# Patient Record
Sex: Female | Born: 1965 | Race: Black or African American | Hispanic: No | Marital: Single | Smoking: Never smoker
Health system: Southern US, Community
[De-identification: ages and names within clinical notes are randomized; demographics above are authoritative.]

## PROBLEM LIST (undated history)

## (undated) DIAGNOSIS — R0602 Shortness of breath: Secondary | ICD-10-CM

## (undated) DIAGNOSIS — I5032 Chronic diastolic (congestive) heart failure: Secondary | ICD-10-CM

## (undated) DIAGNOSIS — E669 Obesity, unspecified: Secondary | ICD-10-CM

## (undated) DIAGNOSIS — J45901 Unspecified asthma with (acute) exacerbation: Secondary | ICD-10-CM

## (undated) DIAGNOSIS — Z01818 Encounter for other preprocedural examination: Secondary | ICD-10-CM

## (undated) DIAGNOSIS — E1169 Type 2 diabetes mellitus with other specified complication: Secondary | ICD-10-CM

## (undated) DIAGNOSIS — E785 Hyperlipidemia, unspecified: Secondary | ICD-10-CM

## (undated) DIAGNOSIS — N179 Acute kidney failure, unspecified: Secondary | ICD-10-CM

## (undated) DIAGNOSIS — E871 Hypo-osmolality and hyponatremia: Secondary | ICD-10-CM

## (undated) DIAGNOSIS — J4550 Severe persistent asthma, uncomplicated: Secondary | ICD-10-CM

## (undated) DIAGNOSIS — Z9289 Personal history of other medical treatment: Secondary | ICD-10-CM

## (undated) DIAGNOSIS — I509 Heart failure, unspecified: Secondary | ICD-10-CM

## (undated) DIAGNOSIS — I10 Essential (primary) hypertension: Secondary | ICD-10-CM

## (undated) HISTORY — PX: TRACHEOSTOMY: SUR1362

## (undated) HISTORY — DX: Shortness of breath: R06.02

## (undated) HISTORY — DX: Unspecified asthma with (acute) exacerbation: J45.901

## (undated) HISTORY — DX: Acute kidney failure, unspecified: N17.9

## (undated) HISTORY — DX: Type 2 diabetes mellitus with other specified complication: E11.69

## (undated) HISTORY — DX: Obesity, unspecified: E66.9

## (undated) HISTORY — DX: Encounter for other preprocedural examination: Z01.818

## (undated) HISTORY — DX: Severe persistent asthma, uncomplicated: J45.50

## (undated) HISTORY — PX: OTHER SURGICAL HISTORY: SHX169

## (undated) HISTORY — PX: EYE SURGERY: SHX253

## (undated) HISTORY — DX: Hypo-osmolality and hyponatremia: E87.1

## (undated) HISTORY — DX: Chronic diastolic (congestive) heart failure: I50.32

---

## 2000-09-01 ENCOUNTER — Other Ambulatory Visit: Admission: RE | Admit: 2000-09-01 | Discharge: 2000-09-01 | Payer: Self-pay | Admitting: Family Medicine

## 2000-10-08 ENCOUNTER — Inpatient Hospital Stay (HOSPITAL_COMMUNITY): Admission: EM | Admit: 2000-10-08 | Discharge: 2000-10-13 | Payer: Self-pay | Admitting: Emergency Medicine

## 2000-10-08 ENCOUNTER — Encounter: Payer: Self-pay | Admitting: Emergency Medicine

## 2000-10-10 ENCOUNTER — Encounter: Payer: Self-pay | Admitting: General Surgery

## 2000-11-10 ENCOUNTER — Ambulatory Visit (HOSPITAL_COMMUNITY): Admission: RE | Admit: 2000-11-10 | Discharge: 2000-11-10 | Payer: Self-pay | Admitting: Family Medicine

## 2001-12-05 ENCOUNTER — Emergency Department (HOSPITAL_COMMUNITY): Admission: EM | Admit: 2001-12-05 | Discharge: 2001-12-05 | Payer: Self-pay | Admitting: Emergency Medicine

## 2001-12-05 ENCOUNTER — Encounter: Payer: Self-pay | Admitting: *Deleted

## 2002-10-14 ENCOUNTER — Inpatient Hospital Stay (HOSPITAL_COMMUNITY): Admission: EM | Admit: 2002-10-14 | Discharge: 2002-10-21 | Payer: Self-pay | Admitting: Emergency Medicine

## 2003-06-10 ENCOUNTER — Inpatient Hospital Stay (HOSPITAL_COMMUNITY): Admission: EM | Admit: 2003-06-10 | Discharge: 2003-06-14 | Payer: Self-pay | Admitting: *Deleted

## 2003-10-17 ENCOUNTER — Other Ambulatory Visit: Admission: RE | Admit: 2003-10-17 | Discharge: 2003-10-17 | Payer: Self-pay | Admitting: Obstetrics & Gynecology

## 2004-05-07 ENCOUNTER — Emergency Department (HOSPITAL_COMMUNITY): Admission: EM | Admit: 2004-05-07 | Discharge: 2004-05-07 | Payer: Self-pay | Admitting: Emergency Medicine

## 2004-05-10 ENCOUNTER — Ambulatory Visit: Payer: Self-pay | Admitting: *Deleted

## 2004-05-10 ENCOUNTER — Ambulatory Visit (HOSPITAL_COMMUNITY): Admission: RE | Admit: 2004-05-10 | Discharge: 2004-05-10 | Payer: Self-pay | Admitting: Family Medicine

## 2006-06-07 ENCOUNTER — Emergency Department (HOSPITAL_COMMUNITY): Admission: EM | Admit: 2006-06-07 | Discharge: 2006-06-07 | Payer: Self-pay | Admitting: Emergency Medicine

## 2006-07-26 ENCOUNTER — Ambulatory Visit (HOSPITAL_COMMUNITY): Admission: RE | Admit: 2006-07-26 | Discharge: 2006-07-26 | Payer: Self-pay | Admitting: Family Medicine

## 2007-07-27 ENCOUNTER — Ambulatory Visit (HOSPITAL_COMMUNITY): Admission: RE | Admit: 2007-07-27 | Discharge: 2007-07-27 | Payer: Self-pay | Admitting: Family Medicine

## 2008-01-18 ENCOUNTER — Emergency Department (HOSPITAL_COMMUNITY): Admission: EM | Admit: 2008-01-18 | Discharge: 2008-01-19 | Payer: Self-pay | Admitting: Emergency Medicine

## 2008-05-13 ENCOUNTER — Emergency Department (HOSPITAL_COMMUNITY): Admission: EM | Admit: 2008-05-13 | Discharge: 2008-05-13 | Payer: Self-pay | Admitting: Emergency Medicine

## 2008-07-29 ENCOUNTER — Ambulatory Visit (HOSPITAL_COMMUNITY): Admission: RE | Admit: 2008-07-29 | Discharge: 2008-07-29 | Payer: Self-pay | Admitting: Family Medicine

## 2009-08-17 ENCOUNTER — Ambulatory Visit (HOSPITAL_COMMUNITY): Admission: RE | Admit: 2009-08-17 | Discharge: 2009-08-17 | Payer: Self-pay | Admitting: Family Medicine

## 2010-03-22 ENCOUNTER — Encounter: Payer: Self-pay | Admitting: Family Medicine

## 2010-06-10 LAB — GLUCOSE, CAPILLARY
Glucose-Capillary: 45 mg/dL — ABNORMAL LOW (ref 70–99)
Glucose-Capillary: 62 mg/dL — ABNORMAL LOW (ref 70–99)

## 2010-07-16 NOTE — Discharge Summary (Signed)
NAMESONDI, GRIBBON                      ACCOUNT NO.:  [id number]   MEDICAL RECORD NO.:  [id number]                   PATIENT TYPE:  INP   LOCATION:  A307                                 FACILITY:  [facility]   PHYSICIAN:  Barrie Folk. Berdine Addison, M.D.                DATE OF BIRTH:  Aug 08, 1965   DATE OF ADMISSION:  10/12/2002  DATE OF DISCHARGE:  10/21/2002                                 DISCHARGE SUMMARY   The patient is a 45 year old, single, gravida 0, disabled black female  from  [place], [place].   CHIEF COMPLAINT:  Presentation of boil x3 days involving the right upper  thigh.  The boil ruptured on October 12, 2002.  The drainage was described as  malodorous, pustular, and bloody in nature.  History was also positive for  fever at home.  She denied chills or trauma to right thigh.   MEDICAL HISTORY:  Positive for insulin-dependent diabetes mellitus, morbid  obesity, hypertension, hyperlipidemia, and asthma.   Past medical  history positive for hospitalization for respiratory therapy  in 1986 treated at [facility] by tracheostomy and hospitalization  x2 for bronchitis.   Habits were negative for tobacco and street drug use.  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   Last menstrual period was August 14, 2002.   FAMILY HISTORY:  1. Revealed mother living, age 74, with history of hypertension and     diabetes.  2. Father deceased, age 25, cause unknown.  3. One sister living, age 74, with a history of anemia.   HOSPITAL COURSE BY PROBLEM:  Problem 1: ACUTE RIGHT UPPER THIGH ABSCESS.  Vitals on admission were as follows: Temperature 101.7, pulse 115,  respirations 20, blood pressure 175/95.  General appearance revealed an  overweight, slightly short, alert, black female in no apparent respiratory  distress.  Right thigh positive for slight medial area of induration, now  erythematous. The middle of induration had a drainage stoma.   Drainage from  the stoma was cloudy and malodorous.  Affected area by the proximal part of  the thigh.  Left extremity was benign.   Significant labs on admission were as follows:  White count 16.7, hemoglobin  11.4, hematocrit 36.0, platelets 281,000.  Sodium 136, potassium 3.3,  chloride 102, CO2 30, glucose 162, BUN 8, creatinine 0.8.   The patient was treated with IV fluid, blood culture x2, initially IV  Rocephin x1 then Unasyn 3 gm IV initially, Unasyn 1.5 gm IV q.6h., Levaquin  500 mg p.o. daily, gram stain and culture of draining right thigh area,  Tylenol for temperature and other supportive measures.  The patient's  hospital course was uneventful. Repeat white count on October 13, 2002 was  12,400.  Repeat white count on October 16, 2002 was 9800.  Blood culture was  negative for growth after 5 days.  Culture of right thigh wound demonstrated  multiple organisms present.  No prominent, or predominate organisms.  Moreover, pathology indicated no Staphylococcus aureus isolated, and no  group A Streptococcus was isolated.  The patient was afebrile at the time of  discharge.  Induration has significantly decreased.  Drainage was minimal.  The patient had no complaints of fever, chills or general malaise.   Problem 2: HYPOKALEMIA.  Serum potassium on admission was 3.3.  The patient  was treated with p.o. potassium.  Repeat serum potassium on October 20, 2002  was 4.0.   Problem 3: TYPE 1 DIABETES MELLITUS.  Serum glucose on admission was 162.  Hemoglobin A1c was 13.1.  The patient was treated with carbohydrate,  modified, 1600 calorie, 4-gram sodium, low cholesterol, Humulin N, 40 units  subcu every morning at breakfast.  Humulin R 15 units subcu every morning at  breakfast.  Humulin N 15 units subcu every day at supper.  Humulin R 10  units subcu every day at supper.  Accu-Check frequently.  The patient's  blood sugar was controlled during this hospitalization.  The dietician did   see the patient during this hospitalization.  The importance of adherence to  diet was emphasized to the patient.  The patient is capable of understanding  the recommendations by the dietician.   Problem 4: MORBID OBESITY.  The importance of weight loss was discussed with  the patient.  The significance of obesity pertaining to diabetes and  hypertension were also emphasized to the patient.  The patient was  encouraged to walk daily and stay on a diet.   Problem 5: HYPERTENSION.  Blood pressure on admission was 175/95.  Lungs  were clear.  Heart revealed audible S1, S2, without murmurs, rubs, or  gallops.  Rate was 96. The patient was treated with Lasix 40 mg p.o. daily,  Vasotec 5 mg p.o. b.i.d. and low sodium diet. Blood pressure on the morning  of discharge was 134/81.  Her heart exam was within normal limits.  The  patient had no complaints of chest pain, shortness of breath or palpitations  at the time of discharge.   Problem 6: HYPERLIPIDEMIA.  Fasting lipid profile on October 13, 2002  revealed the following:  Total cholesterol 187, triglycerides of 58, HDL  cholesterol 56, LDL cholesterol 119.  The patient was continued on low  cholesterol diet and Lipitor 10 mg p.o. at bedtime. She was not complaining  of any muscle aches or rash.  The patient was discharged home on October 21, 2002.  She was discharged to her home where she lives with her mother.   DISCHARGE INSTRUCTIONS:  1. Diet:  Carbohydrate modified 1600 calorie, low salt, low cholesterol.  2. Activity:  As tolerated.  3. Medications:     a. Lipitor 10 mg 1 tablet p.o. at bedtime.     b. Actos 45 mg 1 tablet p.o. every day with breakfast.     c. Lortab 5 mg 1-2 tablets p.o. q.4-6h. as needed for pain.     d. Glucovance 2.5 mg 1 tablet p.o. every day with supper.     e. Lasix 40 mg 1 tablet p.o. every day.     f. Vasotec 5 mg 1 tablet twice a day.    g. Augmentin XR 1000 mg every day with food.     h. Humulin N 40  units subcu every morning with breakfast.        a. Humulin R 15 units subcu every morning with breakfast.  i. Humulin N 15 units subcu every day with supper.     j. Humulin R 10 units subcu every day with supper.  4. Accu-Check 7 a.m., 6 p.m. and 10 p.m. every day.  5. Follow up with Dr. Berdine Addison October 28, 2002.   FINAL PRIMARY DIAGNOSES:  1. Acute right thigh abscess.  2. Hypokalemia.   SECONDARY DIAGNOSES:  1. Insulin-dependent diabetes mellitus.  2. Hypertension.  3. Hyperlipidemia.  4. Morbid obesity.                                               Barrie Folk. Berdine Addison, M.D.    Armanda Heritage  D:  10/21/2002  T:  10/22/2002  Job:  [id number]

## 2010-07-16 NOTE — Procedures (Signed)
[name], [name]          ACCOUNT NO.:  [id number]   MEDICAL RECORD NO.:  [id number]          PATIENT TYPE:  OUT   LOCATION:  RAD                           FACILITY:  [facility]   PHYSICIAN:  Scarlett Presto, M.D.   DATE OF BIRTH:  Oct 11, 1965   DATE OF PROCEDURE:  DATE OF DISCHARGE:                                  ECHOCARDIOGRAM   TAPE NUMBER:  [id number].   TAPE NUMBER:  [id number].   This is a morbidly obese woman with hypertension and a question of redux.   The M mode tracings are not accurate due to the patient's body habitus.  The  overall quality of the study is extremely difficult secondary to poor  acoustic windows from the patient's body habitus.   ASSESSMENT:  1.  LV systolic function appears to be preserved.  2.  There is normal right ventricular size.  3.  There is no obvious valvular heart disease.  4.  There is no pericardial effusion.  5.  Beyond these elements, there is very little clinically relevant from      this study.      JH/MEDQ  D:  05/10/2004  T:  05/10/2004  Job:  [id number]   cc:   Barrie Folk. Berdine Addison, MD  [address]  [place]  [place] [address]  Fax: [phone]

## 2010-07-16 NOTE — Discharge Summary (Signed)
[name], [name]                      ACCOUNT NO.:  [id number]   MEDICAL RECORD NO.:  [id number]                   PATIENT TYPE:  INP   LOCATION:  A325                                 FACILITY:  [facility]   PHYSICIAN:  Leane Para C. Tamala Julian, M.D.                DATE OF BIRTH:  [birth year]   DATE OF ADMISSION:  06/09/2004  DATE OF DISCHARGE:  06/14/2003                                 DISCHARGE SUMMARY   DISCHARGE DIAGNOSES:  1. Acute bronchitis with hypoxemia.  2. Insulin-dependent diabetes mellitus.  3. Hypertension.  4. Hyperlipidemia.  5. Morbid obesity.   DISPOSITION:  The patient is discharged home in stable improved condition.   DISCHARGE MEDICATIONS:  1. DuoNebs 3 mL 4 times daily.  2. Glucovance 2.5/500 mg b.i.d.  3. Lasix 20 mg daily.  4. K-Dur 10 mEq daily.  5. Lipitor 40 mg nightly.  6. Altace 5 mg daily.  7. Humulin 70/30 -- 40 units q.a.m. and 30 units q.p.m.  8. Hemocyte 1 daily.  9. Histussin HC 2 teaspoons q.4 h. p.r.n.   FOLLOWUP:  The patient is scheduled to see Dr. Barrie Folk. Hill in the office  1 week post discharge.   SUMMARY:  [age]-year-old female with history of chest pain, shortness  of breath, productive cough of green-brown sputum.  She had worsening  exertional dyspnea.  She was seen in the emergency room with hypoxemia,  fever, leukocytosis.  Clinical exam was compatible with acute bronchitis and  she was admitted.  The patient was started on treatment with nebulizers, IV  Levaquin, p.o. Zithromax and continued on her baseline medications.  She was  given antitussive medications.  She improved.  She had initial white count  of 17,000, which gradually improved.  O2 saturation improved and her cough  improved.  By 14 April, her white count was 13,000.  Her cough was  significantly less.  O2 saturation was 93% to 94%.  By the 16th of April,  she was doing well; she had no cough, fever or chills; she had no dyspnea at  rest; O2 saturation was 94%  on room air; she was afebrile.  Her lungs were  clear; she has no wheezes, rhonchi or rales.  She was felt to be stable  enough for discharge and she was discharged home in stable, satisfactory  condition.    ___________________________________________                                         Vernon Prey Tamala Julian, M.D.   LCS/MEDQ  D:  07/20/2003  T:  07/21/2003  Job:  [id number]

## 2010-07-16 NOTE — H&P (Signed)
NAMEBREONDA, [name]                      ACCOUNT NO.:  [id number]   MEDICAL RECORD NO.:  [id number]                   PATIENT TYPE:  OBV   LOCATION:  A307                                 FACILITY:  [facility]   PHYSICIAN:  Barrie Folk. Berdine Addison, M.D.                DATE OF BIRTH:  03-31-1965   DATE OF ADMISSION:  10/12/2002  DATE OF DISCHARGE:                                HISTORY & PHYSICAL   CHIEF COMPLAINT:  Painful draining boil of right upper thigh.   HISTORY OF PRESENT ILLNESS:  The patient is a 45 year old single, gravida 0,  disabled black female from [place], [place].  The boil has been  present x3 days.  The boil ruptured on October 12, 2002.  Drainage from the  boil is described as malodorous and drainage is pustular-bloody.  History is  positive for fever at home.  She denies chills, or trauma to right thigh.   PAST MEDICAL HISTORY:  1. Insulin-dependent diabetes mellitus.  2. Morbid obesity.  3. Hypertension.  4. Hyperlipidemia.  5. Asthma.  6. Negative for tuberculosis, cancer, sickle cell and seizure disorder.   Past medical history is positive for hospitalization for respiratory failure  in 1986, treated at [facility] and requiring tracheostomy and  hospitalization x2 for bronchitis.   SOCIAL HISTORY:  Habits are negative for tobacco and street drugs.  Habit is  positive for former use of ethanol, greater than ten years.  Last menstrual  period was on August 14, 2002 and duration was four weeks.  The patient is  being followed by Jonnie Kind, M.D., Gynecologist, pertaining to this  problem.   Sexually transmitted disease history is negative for gonorrhea, syphilis,  herpes and HIV infection.   PRESCRIBED MEDICATIONS:  1. Humulin N 65 units subcu every morning.  2. Humulin R 35 units subcu every morning.  3. Vasotec 5 mg two tablets p.o. every day.  4. Furosemide 40 mg p.o. daily.  5. Motrin 800 mg p.o. t.i.d.  6. Glucovance 2.5 mg p.o. every  morning.  7. Lipitor 10 mg p.o. at bedtime.  8. Actos 40 mg p.o. every day.   ALLERGIES:  Not allergic to any known medication.   FAMILY HISTORY:  Mother living, age 40 with history of hypertension and  diabetes; father deceased at age 73, cause unknown; one sister living, age  15 with history of anemia.   REVIEW OF SYSTEMS:  Negative for chronic headache, epistaxis, bleeding gums,  chronic cough, wheezing, hemoptysis, chest pain, shortness of breath,  syncope, dizziness, nausea, vomiting, diarrhea, constipation, vaginal  discharge, vaginal itching, weight loss, etc.   PHYSICAL EXAMINATION:  GENERAL:  General appearance revealed a middle aged,  overweight, slightly short, alert black female in no apparent respiratory  distress.  VITAL SIGNS:  Temperature 101.7, pulse 115, respirations 20, blood pressure  175/95.  SKIN:  Warm and dry.  HEENT:  Head-Positive thinning of hair.  Ears-Normal, external canals  patent, tympanic membranes, pearly gray.  Eyes-Lids negative ptosis, sclerae  white, pupils round, equal and reactive to light.  Extraocular movements  intact.  Nose-Negative for discharge.  Mouth-No oral lesions.  Posterior  pharynx positive for mildly enlarged tonsils without erythema, exudate.  Neck-Negative for lymphadenopathy or thyromegaly.  Supraclavicular space, no  palpable nodes.  LUNGS:  Clear.  BREASTS:  No skin changes, no nodules on palpation.  No irregular discharge.  HEART:  Audible S1, S2 without murmur.  Rate 96, regular rhythm.  ABDOMEN:  Obese, hypoactive bowel sounds, soft, nontender in all four  quadrants.  No palpable mass, no organomegaly.  PELVIC:  Deferred.  RECTAL:  Deferred.  EXTREMITIES:  Lower extremity-Right proximal thigh, slight medial aspect,  positive for tender area, nonerythematous, of induration with draining  stoma.  Drainage from stoma is cloudy and malodorous.  Left lower extremity,  benign.  [place]ia-Trace of edema bilaterally.  Palpable  dorsalis pedis  bilaterally.  NEUROLOGIC:  Alert and oriented to person, place and time.  Cranial nerves  II-XII appeared intact.   LABORATORY DATA:  White count 16.7, hemoglobin 11.4, hematocrit 36.0,  platelets 281,000.  Sodium 136, potassium 3.3, chloride 102, CO2 30, glucose  162, BUN 8, creatinine 0.8.   TREATMENT:  In the emergency room, the patient was treated with Rocephin 1  gram IV at 2245 on October 12, 2002, Unasyn 3 grams IV at 2345 on October 12, 2002 and Lortab 2 tablets p.o. at 0030 on October 13, 2002.   IMPRESSIONS:  1. Primary acute right proximal thigh abscess.  2. Mild hypokalemia.   SECONDARY DIAGNOSES:  1. Hypertension.  2. Insulin dependent diabetes mellitus.  3. Morbid obesity.  4. Hyperlipidemia.   PLAN:  Blood culture, Gram stain and culture of drainage from wound of right  thigh, IV antibiotics, Vasotec 5 mg p.o. b.i.d., Lasix 40 mg p.o. daily, IV  fluids, Motrin 800 mg p.o. t.i.d. with meals, Glucovance 2.5 mg p.o. every  morning with breakfast, Actos 45 mg p.o. every morning with breakfast,  Humulin N 40 units subcu every morning at breakfast, Humulin R 15 units  subcu every morning at breakfast, Humulin N 15 units subcu every day at  supper, Humulin R 10 units subcu every day at supper.  Laboratory-Hemoglobin  A1C, urinalysis.  Accu-Chek at 0700,  1600 and 2200 daily.  Diet-Carbohydrate modified, 1600 calorie, 4 gram  sodium, low cholesterol.  Fasting lipid profile.  Repeat CBC in 24 hours.  Lortab 5 mg 2 tablets p.o. q.4-6h. p.r.n. for pain, K-Dur 20 mEq p.o. now  and then b.i.d. x2 days, Lipitor 10 mg p.o. at bedtime.                                                 Barrie Folk. Berdine Addison, M.D.    Armanda Heritage  D:  10/13/2002  T:  10/13/2002  Job:  [id number]

## 2010-08-25 ENCOUNTER — Other Ambulatory Visit (HOSPITAL_COMMUNITY): Payer: Self-pay | Admitting: Family Medicine

## 2010-08-25 DIAGNOSIS — Z139 Encounter for screening, unspecified: Secondary | ICD-10-CM

## 2010-09-02 ENCOUNTER — Ambulatory Visit (HOSPITAL_COMMUNITY)
Admission: RE | Admit: 2010-09-02 | Discharge: 2010-09-02 | Disposition: A | Discharge status: Home / Self Care | Payer: Medicare Other | Source: Ambulatory Visit | Attending: Family Medicine | Admitting: Family Medicine

## 2010-09-02 DIAGNOSIS — Z1231 Encounter for screening mammogram for malignant neoplasm of breast: Secondary | ICD-10-CM | POA: Insufficient documentation

## 2010-09-02 DIAGNOSIS — Z139 Encounter for screening, unspecified: Secondary | ICD-10-CM

## 2010-09-08 ENCOUNTER — Other Ambulatory Visit (HOSPITAL_COMMUNITY): Payer: Self-pay | Admitting: Family Medicine

## 2010-09-08 DIAGNOSIS — IMO0002 Reserved for concepts with insufficient information to code with codable children: Secondary | ICD-10-CM

## 2010-09-08 DIAGNOSIS — Z09 Encounter for follow-up examination after completed treatment for conditions other than malignant neoplasm: Secondary | ICD-10-CM

## 2010-09-21 ENCOUNTER — Other Ambulatory Visit (HOSPITAL_COMMUNITY): Payer: Self-pay | Admitting: Family Medicine

## 2010-09-21 DIAGNOSIS — IMO0002 Reserved for concepts with insufficient information to code with codable children: Secondary | ICD-10-CM

## 2010-09-22 ENCOUNTER — Ambulatory Visit (HOSPITAL_COMMUNITY): Payer: Medicare Other

## 2010-10-06 ENCOUNTER — Ambulatory Visit (HOSPITAL_COMMUNITY)
Admission: RE | Admit: 2010-10-06 | Discharge: 2010-10-06 | Disposition: A | Discharge status: Home / Self Care | Payer: Medicare Other | Source: Ambulatory Visit | Attending: Family Medicine | Admitting: Family Medicine

## 2010-10-06 DIAGNOSIS — R928 Other abnormal and inconclusive findings on diagnostic imaging of breast: Secondary | ICD-10-CM | POA: Insufficient documentation

## 2010-10-06 DIAGNOSIS — IMO0002 Reserved for concepts with insufficient information to code with codable children: Secondary | ICD-10-CM

## 2010-10-13 ENCOUNTER — Inpatient Hospital Stay (HOSPITAL_COMMUNITY): Admission: RE | Admit: 2010-10-13 | Payer: Medicare Other | Source: Ambulatory Visit

## 2010-10-13 ENCOUNTER — Encounter (HOSPITAL_COMMUNITY): Payer: Medicare Other

## 2010-12-29 ENCOUNTER — Emergency Department (HOSPITAL_COMMUNITY)
Admission: EM | Admit: 2010-12-29 | Discharge: 2010-12-29 | Disposition: A | Discharge status: Home / Self Care | Payer: PRIVATE HEALTH INSURANCE | Attending: Emergency Medicine | Admitting: Emergency Medicine

## 2010-12-29 ENCOUNTER — Emergency Department (HOSPITAL_COMMUNITY): Payer: PRIVATE HEALTH INSURANCE

## 2010-12-29 ENCOUNTER — Other Ambulatory Visit: Payer: Self-pay

## 2010-12-29 DIAGNOSIS — M549 Dorsalgia, unspecified: Secondary | ICD-10-CM | POA: Insufficient documentation

## 2010-12-29 DIAGNOSIS — J45909 Unspecified asthma, uncomplicated: Secondary | ICD-10-CM | POA: Insufficient documentation

## 2010-12-29 DIAGNOSIS — R739 Hyperglycemia, unspecified: Secondary | ICD-10-CM

## 2010-12-29 DIAGNOSIS — I10 Essential (primary) hypertension: Secondary | ICD-10-CM | POA: Insufficient documentation

## 2010-12-29 DIAGNOSIS — R079 Chest pain, unspecified: Secondary | ICD-10-CM | POA: Insufficient documentation

## 2010-12-29 DIAGNOSIS — E119 Type 2 diabetes mellitus without complications: Secondary | ICD-10-CM | POA: Insufficient documentation

## 2010-12-29 HISTORY — DX: Essential (primary) hypertension: I10

## 2010-12-29 MED ORDER — OXYCODONE-ACETAMINOPHEN 5-325 MG PO TABS: 1.0000 | ORAL_TABLET | ORAL | Status: AC | PRN

## 2010-12-29 MED ORDER — OXYCODONE-ACETAMINOPHEN 5-325 MG PO TABS
1.0000 | ORAL_TABLET | Freq: Once | ORAL | Status: AC
  Administered 2010-12-29: 1 via ORAL
  Filled 2010-12-29: qty 1

## 2010-12-29 NOTE — ED Notes (Signed)
Complain of chest pain that started last night. Denies n/v/d or fever

## 2010-12-29 NOTE — ED Notes (Signed)
Pt states that she has a sharp pain in the right side of her chest since  2 am. Denies nausea, vomiting or shortness of breath. Pt alert and oriented x 3. Skin warm and dry. Color pink.

## 2010-12-29 NOTE — ED Provider Notes (Signed)
Scribed for Sharyon Cable, MD, the patient was seen in room APA15/APA15 . This chart was scribed by Glory Buff.   CSN: [id number] Arrival date & time: 12/29/2010  9:06 AM   First MD Initiated Contact with Patient 12/29/10 [phone]      Chief Complaint  Patient presents with  . Chest Pain     Patient is a 45 y.o. female presenting with chest pain. The history is provided by the patient. No language interpreter was used.  Chest Pain The pain is currently at 8/10. The quality of the pain is described as sharp. The pain does not radiate. Pertinent negatives for primary symptoms include no fever, no shortness of breath, no abdominal pain, no nausea and no vomiting.  Pertinent negatives for associated symptoms include no diaphoresis, no lower extremity edema, no near-syncope, no numbness and no weakness.    Pt seen at 9:30 [name] is a 45 y.o. female who presents to the Emergency Department complaining of chest pain. At 2 this morning Pt says she was bending over and folding clothes when she had sudden onset of right-sided chest pain. Pt originally thought the chest pain was heartburn and treated pain with baking soda. Pt says CP resolved but returned at 7am ~2.5 hour ago and has been constant since then. Pain does not radiate but Pt does reports some associated back pain but does not appear related to her CP. Pain is described as sharp and currently rated 8/10 in severity. Pain is not aggravated by deep breathing or exertion. Pt denies diaphoresis, SOB, weakness, numbness, n/v/d, fever, or dizziness. Pt reports some recent cough. Pt denies recent LOC, new swelling, recent travel or surgery. Pt denies history of blood clots.  Additionally Pt has h/o diabetes. Blood sugar is not always controlled and is usually ~200. This week around 120. Has not tested today.  No personal h/o CAD/PE.   PCP Dr. Berdine Addison. Has not seen PCP recently.  Past Medical History  Diagnosis Date  . Asthma   .  Hypertension   . Diabetes mellitus     History reviewed. No pertinent past surgical history.  History reviewed. No pertinent family history. Mother and uncle have heart problems, not at young age.   History  Substance Use Topics  . Smoking status: Not on file  . Smokeless tobacco: Not on file  . Alcohol Use: Yes  Non smoker. Does not use drugs.   Review of Systems  Constitutional: Negative for fever and diaphoresis.  Respiratory: Negative for shortness of breath.   Cardiovascular: Positive for chest pain. Negative for leg swelling (no new) and near-syncope.  Gastrointestinal: Negative for nausea, vomiting, abdominal pain and diarrhea.  Musculoskeletal: Positive for back pain.  Neurological: Negative for syncope, weakness and numbness.  All other systems reviewed and are negative.   10 Systems reviewed and are negative for acute change except as noted in the HPI.  Allergies  Review of patient's allergies indicates no known allergies.  Home Medications  No current outpatient prescriptions on file.   Ht 5\' 1"  (1.549 m)  Wt 400 lb (181.439 kg)  BMI 75.58 kg/m2  LMP 10/28/2010 BP 146/82  Pulse 69  Temp(Src) 98.6 F (37 C) (Oral)  Resp 18  Ht 5\' 1"  (1.549 m)  Wt 400 lb (181.439 kg)  BMI 75.58 kg/m2  SpO2 98%  LMP 10/28/2010  Physical Exam CONSTITUTIONAL: Well developed/well nourished HEAD AND FACE: Normocephalic/atraumatic EYES: EOMI/PERRL ENMT: Mucous membranes moist NECK: supple no meningeal signs SPINE:entire  spine nontender CV: S1/S2 noted, no murmurs/rubs/gallops noted. No redness or crepitance of chest. Intact distal pulses. LUNGS: Lungs are clear to auscultation bilaterally, no apparent distress ABDOMEN: soft, nontender, no rebound or guarding. Obese GU:no cva tenderness NEURO: Pt is awake/alert, moves all extremitiesx4, well appearing, smiling, no distress EXTREMITIES: pulses normal, full ROM. symmetric BLE chronic pitting edema with no redness or  tenderness.  SKIN: warm, color normal PSYCH: no abnormalities of mood noted  ED Course  Procedures (including critical care time)  OTHER DATA REVIEWED: Nursing notes, vital signs, and past medical records reviewed. xrays reviewed and considered All labs/vitals reviewed and considered  Dw/ dr hill, her PCP, will see in followup this Monday at 1045am since she missed recent appointment  DIAGNOSTIC STUDIES: Oxygen Saturation is 96% on room air, normal by my interpretation.    Labs Reviewed  GLUCOSE, CAPILLARY - Abnormal; Notable for the following:    Glucose-Capillary 286 (*)    All other components within normal limits  POCT CBG MONITORING   Dg Chest 2 View  12/29/2010  *RADIOLOGY REPORT*  Clinical Data: Chest pain, hypertension, asthma, diabetes  CHEST - 2 VIEW  Comparison: Chest x-ray of 05/13/2008  Findings: The lungs are clear.  Mediastinal contours appear stable. The heart is within normal limits in size.  No bony abnormality is seen.  IMPRESSION: No active lung disease.  Original Report Authenticated By: Joretta Bachelor, M.D.    9:33 AM EDP at PT bedside. Discussed with PT ECG looks normal. Plan to get chest x ray. Plan to discharge with follow up w/ Dr. Berdine Addison if chest xray is negative.     Pt improved, well appearing.  My suspicion for ACS/PE/Dissection is low.  She will f/u next week.  She admits to issues with glucose control recently.  No vomiting reported, she can f/u as outpatient for this  BP 146/82  Pulse 69  Temp(Src) 98.6 F (37 C) (Oral)  Resp 18  Ht 5\' 1"  (1.549 m)  Wt 400 lb (181.439 kg)  BMI 75.58 kg/m2  SpO2 98%  LMP 10/28/2010   MDM   Date: 12/29/2010  Rate: 85  Rhythm: normal sinus rhythm  QRS Axis: normal  Intervals: normal  ST/T Wave abnormalities: normal  Conduction Disutrbances:none  Narrative Interpretation:   Old EKG Reviewed: none available     I personally performed the services described in this documentation, which was scribed in  my presence. The recorded information has been reviewed and considered.         Sharyon Cable, MD 12/29/10 [phone]

## 2011-09-08 ENCOUNTER — Other Ambulatory Visit (HOSPITAL_COMMUNITY): Payer: Self-pay | Admitting: Family Medicine

## 2011-09-08 DIAGNOSIS — Z139 Encounter for screening, unspecified: Secondary | ICD-10-CM

## 2011-10-07 ENCOUNTER — Ambulatory Visit (HOSPITAL_COMMUNITY)
Admission: RE | Admit: 2011-10-07 | Discharge: 2011-10-07 | Disposition: A | Discharge status: Home / Self Care | Payer: PRIVATE HEALTH INSURANCE | Source: Ambulatory Visit | Attending: Family Medicine | Admitting: Family Medicine

## 2011-10-07 ENCOUNTER — Ambulatory Visit (HOSPITAL_COMMUNITY): Payer: PRIVATE HEALTH INSURANCE

## 2011-10-07 DIAGNOSIS — Z1231 Encounter for screening mammogram for malignant neoplasm of breast: Secondary | ICD-10-CM | POA: Insufficient documentation

## 2011-10-07 DIAGNOSIS — Z139 Encounter for screening, unspecified: Secondary | ICD-10-CM

## 2012-06-04 ENCOUNTER — Emergency Department (HOSPITAL_COMMUNITY)
Admission: EM | Admit: 2012-06-04 | Discharge: 2012-06-04 | Disposition: A | Discharge status: Home / Self Care | Payer: PRIVATE HEALTH INSURANCE | Attending: Emergency Medicine | Admitting: Emergency Medicine

## 2012-06-04 ENCOUNTER — Encounter (HOSPITAL_COMMUNITY): Payer: Self-pay | Admitting: *Deleted

## 2012-06-04 DIAGNOSIS — L0231 Cutaneous abscess of buttock: Secondary | ICD-10-CM | POA: Insufficient documentation

## 2012-06-04 DIAGNOSIS — Z7982 Long term (current) use of aspirin: Secondary | ICD-10-CM | POA: Insufficient documentation

## 2012-06-04 DIAGNOSIS — Z79899 Other long term (current) drug therapy: Secondary | ICD-10-CM | POA: Insufficient documentation

## 2012-06-04 DIAGNOSIS — E119 Type 2 diabetes mellitus without complications: Secondary | ICD-10-CM | POA: Insufficient documentation

## 2012-06-04 DIAGNOSIS — J45909 Unspecified asthma, uncomplicated: Secondary | ICD-10-CM | POA: Insufficient documentation

## 2012-06-04 DIAGNOSIS — Z794 Long term (current) use of insulin: Secondary | ICD-10-CM | POA: Insufficient documentation

## 2012-06-04 DIAGNOSIS — L03317 Cellulitis of buttock: Secondary | ICD-10-CM | POA: Insufficient documentation

## 2012-06-04 DIAGNOSIS — I10 Essential (primary) hypertension: Secondary | ICD-10-CM | POA: Insufficient documentation

## 2012-06-04 DIAGNOSIS — E669 Obesity, unspecified: Secondary | ICD-10-CM | POA: Insufficient documentation

## 2012-06-04 MED ORDER — HYDROCODONE-ACETAMINOPHEN 5-325 MG PO TABS: 2.0000 | ORAL_TABLET | ORAL | Status: DC | PRN

## 2012-06-04 MED ORDER — SULFAMETHOXAZOLE-TRIMETHOPRIM 800-160 MG PO TABS: 1.0000 | ORAL_TABLET | Freq: Two times a day (BID) | ORAL | Status: DC

## 2012-06-04 MED ORDER — LIDOCAINE HCL (PF) 1 % IJ SOLN
INTRAMUSCULAR | Status: AC
  Administered 2012-06-04: 5 mL
  Filled 2012-06-04: qty 5

## 2012-06-04 NOTE — ED Provider Notes (Signed)
History     CSN: [id number]  Arrival date & time 06/04/12  1825   First MD Initiated Contact with Patient 06/04/12 1853      Chief Complaint  Patient presents with  . Abscess    (Consider location/radiation/quality/duration/timing/severity/associated sxs/prior treatment) Patient is a 47 y.o. female presenting with abscess. The history is provided by the patient.  Abscess Abscess location: buttock. Abscess quality: draining, fluctuance, induration, painful, redness and warmth   Red streaking: no   Duration:  4 days Progression:  Worsening Pain details:    Quality:  Burning   Severity:  Moderate   Timing:  Constant   Progression:  Worsening   Past Medical History  Diagnosis Date  . Asthma   . Hypertension   . Diabetes mellitus     History reviewed. No pertinent past surgical history.  No family history on file.  History  Substance Use Topics  . Smoking status: Never Smoker   . Smokeless tobacco: Not on file  . Alcohol Use: Yes     Comment: occasional    OB History   Grav Para Term Preterm Abortions TAB SAB Ect Mult Living                  Review of Systems  All other systems reviewed and are negative.    Allergies  Review of patient's allergies indicates no known allergies.  Home Medications   Current Outpatient Rx  Name  Route  Sig  Dispense  Refill  . aspirin EC 81 MG tablet   Oral   Take 81 mg by mouth daily.           Marland Kitchen atorvastatin (LIPITOR) 20 MG tablet   Oral   Take 20 mg by mouth daily.           . furosemide (LASIX) 40 MG tablet   Oral   Take 40 mg by mouth 2 (two) times daily.           Marland Kitchen gabapentin (NEURONTIN) 300 MG capsule   Oral   Take 300 mg by mouth 3 (three) times daily.           Marland Kitchen glyBURIDE-metformin (GLUCOVANCE) 5-500 MG per tablet   Oral   Take 1 tablet by mouth daily with breakfast.           . ibuprofen (ADVIL,MOTRIN) 800 MG tablet   Oral   Take 800 mg by mouth every 8 (eight) hours as needed. Pain           . insulin NPH-insulin regular (NOVOLIN 70/30) (70-30) 100 UNIT/ML injection   Subcutaneous   Inject 47 Units into the skin 2 (two) times daily.           . montelukast (SINGULAIR) 10 MG tablet   Oral   Take 10 mg by mouth at bedtime.           . pioglitazone (ACTOS) 45 MG tablet   Oral   Take 45 mg by mouth daily.           Marland Kitchen tetrahydrozoline 0.05 % ophthalmic solution   Both Eyes   Place 1 drop into both eyes daily as needed. Dry, Itchy Eyes          . valsartan (DIOVAN) 160 MG tablet   Oral   Take 160 mg by mouth daily.             There were no vitals taken for this visit.  Physical Exam  Nursing  note and vitals reviewed. Constitutional: She is oriented to person, place, and time. She appears well-developed and well-nourished. No distress.  The patient is an obese female in nad.  HENT:  Head: Normocephalic and atraumatic.  Neck: Neck supple.  Pulmonary/Chest: Effort normal.  Abdominal: Soft. Bowel sounds are normal. She exhibits no distension. There is no tenderness.  Musculoskeletal: Normal range of motion.  Neurological: She is alert and oriented to person, place, and time.  Skin: Skin is warm and dry. She is not diaphoretic.  There is an abscess on the right buttock with purulent, malodorous drainage.  It is approximately 5 cc in diameter with fluctuance palpable.    ED Course  Procedures (including critical care time)  Labs Reviewed - No data to display No results found.   No diagnosis found.  INCISION AND DRAINAGE Performed by: [name] Consent: Verbal consent obtained. Risks and benefits: risks, benefits and alternatives were discussed Type: abscess  Body area: right buttock  Anesthesia: local infiltration  Incision was made with a scalpel.  Local anesthetic: lidocaine 1% without epinephrine  Anesthetic total: 5 ml  Complexity: complex Blunt dissection to break up loculations  Drainage: purulent  Drainage  amount: moderate  Packing material: 1/4 in iodoform gauze  Patient tolerance: Patient tolerated the procedure well with no immediate complications.      MDM  Will add bactrim, pain meds.  Wound check in two days.        [name], MD 06/04/12 [id number]

## 2012-06-04 NOTE — ED Notes (Signed)
Abscess to right buttock x 4 days.

## 2012-06-07 LAB — WOUND CULTURE

## 2012-06-08 ENCOUNTER — Telehealth (HOSPITAL_COMMUNITY): Payer: Self-pay | Admitting: Emergency Medicine

## 2012-06-08 NOTE — ED Notes (Signed)
Patient has +Wound culture.

## 2012-06-08 NOTE — ED Notes (Signed)
+  Wound. Patient given Septra. No sensitivities listed. Chart sent to [place] office for review.

## 2012-06-10 ENCOUNTER — Telehealth (HOSPITAL_COMMUNITY): Payer: Self-pay | Admitting: Emergency Medicine

## 2012-06-10 NOTE — ED Notes (Signed)
Chart returned from [place] office. Per Montine Circle PA-C, return or follow-up with PCP for worsening symptoms/fever.

## 2012-10-02 ENCOUNTER — Other Ambulatory Visit (HOSPITAL_COMMUNITY): Payer: Self-pay | Admitting: Family Medicine

## 2012-10-02 DIAGNOSIS — Z139 Encounter for screening, unspecified: Secondary | ICD-10-CM

## 2012-10-08 ENCOUNTER — Ambulatory Visit (HOSPITAL_COMMUNITY): Payer: Medicare Other

## 2012-10-08 ENCOUNTER — Ambulatory Visit (HOSPITAL_COMMUNITY)
Admission: RE | Admit: 2012-10-08 | Discharge: 2012-10-08 | Disposition: A | Discharge status: Home / Self Care | Payer: PRIVATE HEALTH INSURANCE | Source: Ambulatory Visit | Attending: Family Medicine | Admitting: Family Medicine

## 2012-10-08 DIAGNOSIS — Z1231 Encounter for screening mammogram for malignant neoplasm of breast: Secondary | ICD-10-CM | POA: Insufficient documentation

## 2012-10-08 DIAGNOSIS — Z139 Encounter for screening, unspecified: Secondary | ICD-10-CM

## 2012-11-29 ENCOUNTER — Encounter (HOSPITAL_COMMUNITY): Payer: Self-pay | Admitting: Pharmacy Technician

## 2012-12-04 NOTE — Patient Instructions (Addendum)
Your procedure is scheduled on: 12/10/2012  Report to [facility] at  37  AM.  Call this number if you have problems the morning of surgery: [phone]   Do not eat food or drink liquids :After Midnight.      Take these medicines the morning of surgery with A SIP OF WATER: norco, neurontin, diovan   Do not wear jewelry, make-up or nail polish.  Do not wear lotions, powders, or perfumes. Take your proventil and advair inhalers before you come and bring them with you.  Do not shave 48 hours prior to surgery.  Do not bring valuables to the hospital.  Contacts, dentures or bridgework may not be worn into surgery.  Leave suitcase in the car. After surgery it may be brought to your room.  For patients admitted to the hospital, checkout time is 11:00 AM the day of discharge.   Patients discharged the day of surgery will not be allowed to drive home.  :     Please read over the following fact sheets that you were given: Coughing and Deep Breathing, Surgical Site Infection Prevention, Anesthesia Post-op Instructions and Care and Recovery After Surgery    Cataract A cataract is a clouding of the lens of the eye. When a lens becomes cloudy, vision is reduced based on the degree and nature of the clouding. Many cataracts reduce vision to some degree. Some cataracts make people more near-sighted as they develop. Other cataracts increase glare. Cataracts that are ignored and become worse can sometimes look white. The white color can be seen through the pupil. CAUSES   Aging. However, cataracts may occur at any age, even in newborns.   Certain drugs.   Trauma to the eye.   Certain diseases such as diabetes.   Specific eye diseases such as chronic inflammation inside the eye or a sudden attack of a rare form of glaucoma.   Inherited or acquired medical problems.  SYMPTOMS   Gradual, progressive drop in vision in the affected eye.   Severe, rapid visual loss. This most often happens when  trauma is the cause.  DIAGNOSIS  To detect a cataract, an eye doctor examines the lens. Cataracts are best diagnosed with an exam of the eyes with the pupils enlarged (dilated) by drops.  TREATMENT  For an early cataract, vision may improve by using different eyeglasses or stronger lighting. If that does not help your vision, surgery is the only effective treatment. A cataract needs to be surgically removed when vision loss interferes with your everyday activities, such as driving, reading, or watching TV. A cataract may also have to be removed if it prevents examination or treatment of another eye problem. Surgery removes the cloudy lens and usually replaces it with a substitute lens (intraocular lens, IOL).  At a time when both you and your doctor agree, the cataract will be surgically removed. If you have cataracts in both eyes, only one is usually removed at a time. This allows the operated eye to heal and be out of danger from any possible problems after surgery (such as infection or poor wound healing). In rare cases, a cataract may be doing damage to your eye. In these cases, your caregiver may advise surgical removal right away. The vast majority of people who have cataract surgery have better vision afterward. HOME CARE INSTRUCTIONS  If you are not planning surgery, you may be asked to do the following:  Use different eyeglasses.   Use stronger or brighter lighting.  Ask your eye doctor about reducing your medicine dose or changing medicines if it is thought that a medicine caused your cataract. Changing medicines does not make the cataract go away on its own.   Become familiar with your surroundings. Poor vision can lead to injury. Avoid bumping into things on the affected side. You are at a higher risk for tripping or falling.   Exercise extreme care when driving or operating machinery.   Wear sunglasses if you are sensitive to bright light or experiencing problems with glare.  SEEK  IMMEDIATE MEDICAL CARE IF:   You have a worsening or sudden vision loss.   You notice redness, swelling, or increasing pain in the eye.   You have a fever.  Document Released: 02/14/2005 Document Revised: 02/03/2011 Document Reviewed: 10/08/2010 [facility] Patient Information 2012 [place].PATIENT INSTRUCTIONS POST-ANESTHESIA  IMMEDIATELY FOLLOWING SURGERY:  Do not drive or operate machinery for the first twenty four hours after surgery.  Do not make any important decisions for twenty four hours after surgery or while taking narcotic pain medications or sedatives.  If you develop intractable nausea and vomiting or a severe headache please notify your doctor immediately.  FOLLOW-UP:  Please make an appointment with your surgeon as instructed. You do not need to follow up with anesthesia unless specifically instructed to do so.  WOUND CARE INSTRUCTIONS (if applicable):  Keep a dry clean dressing on the anesthesia/puncture wound site if there is drainage.  Once the wound has quit draining you may leave it open to air.  Generally you should leave the bandage intact for twenty four hours unless there is drainage.  If the epidural site drains for more than 36-48 hours please call the anesthesia department.  QUESTIONS?:  Please feel free to call your physician or the hospital operator if you have any questions, and they will be happy to assist you.

## 2012-12-05 ENCOUNTER — Encounter (HOSPITAL_COMMUNITY)
Admission: RE | Admit: 2012-12-05 | Discharge: 2012-12-05 | Disposition: A | Discharge status: Home / Self Care | Payer: PRIVATE HEALTH INSURANCE | Source: Ambulatory Visit | Attending: Ophthalmology | Admitting: Ophthalmology

## 2012-12-05 ENCOUNTER — Encounter (HOSPITAL_COMMUNITY): Payer: Self-pay

## 2012-12-05 ENCOUNTER — Other Ambulatory Visit: Payer: Self-pay

## 2012-12-05 DIAGNOSIS — Z01812 Encounter for preprocedural laboratory examination: Secondary | ICD-10-CM | POA: Insufficient documentation

## 2012-12-05 DIAGNOSIS — Z01818 Encounter for other preprocedural examination: Secondary | ICD-10-CM | POA: Insufficient documentation

## 2012-12-05 DIAGNOSIS — Z01810 Encounter for preprocedural cardiovascular examination: Secondary | ICD-10-CM | POA: Insufficient documentation

## 2012-12-05 HISTORY — DX: Hyperlipidemia, unspecified: E78.5

## 2012-12-05 LAB — BASIC METABOLIC PANEL
BUN: 19 mg/dL (ref 6–23)
CO2: 29 mEq/L (ref 19–32)
Calcium: 9.2 mg/dL (ref 8.4–10.5)
Chloride: 96 mEq/L (ref 96–112)
Creatinine, Ser: 0.89 mg/dL (ref 0.50–1.10)

## 2012-12-06 NOTE — Progress Notes (Signed)
Pt came in for pre-op interview on 12/05/12 and glucose level was shown to be 480. Dr. Patsey Berthold made aware on 12/06/12 and ordered for a CBG morning of surgery to be taken, no further orders.

## 2012-12-07 MED ORDER — NEOMYCIN-POLYMYXIN-DEXAMETH 3.5-10000-0.1 OP SUSP
OPHTHALMIC | Status: AC
  Filled 2012-12-07: qty 5

## 2012-12-07 MED ORDER — LIDOCAINE HCL 3.5 % OP GEL
OPHTHALMIC | Status: AC
  Filled 2012-12-07: qty 1

## 2012-12-07 MED ORDER — PHENYLEPHRINE HCL 2.5 % OP SOLN
OPHTHALMIC | Status: AC
  Filled 2012-12-07: qty 15

## 2012-12-07 MED ORDER — LIDOCAINE HCL (PF) 1 % IJ SOLN
INTRAMUSCULAR | Status: AC
  Filled 2012-12-07: qty 2

## 2012-12-07 MED ORDER — TETRACAINE HCL 0.5 % OP SOLN
OPHTHALMIC | Status: AC
  Filled 2012-12-07: qty 2

## 2012-12-07 MED ORDER — CYCLOPENTOLATE-PHENYLEPHRINE OP SOLN OPTIME - NO CHARGE
OPHTHALMIC | Status: AC
  Filled 2012-12-07: qty 2

## 2012-12-10 ENCOUNTER — Ambulatory Visit (HOSPITAL_COMMUNITY): Payer: PRIVATE HEALTH INSURANCE | Admitting: Anesthesiology

## 2012-12-10 ENCOUNTER — Ambulatory Visit (HOSPITAL_COMMUNITY)
Admission: RE | Admit: 2012-12-10 | Discharge: 2012-12-10 | Disposition: A | Discharge status: Home / Self Care | Payer: PRIVATE HEALTH INSURANCE | Source: Ambulatory Visit | Attending: Ophthalmology | Admitting: Ophthalmology

## 2012-12-10 ENCOUNTER — Encounter (HOSPITAL_COMMUNITY): Payer: PRIVATE HEALTH INSURANCE | Admitting: Anesthesiology

## 2012-12-10 ENCOUNTER — Encounter (HOSPITAL_COMMUNITY): Payer: Self-pay | Admitting: *Deleted

## 2012-12-10 ENCOUNTER — Encounter (HOSPITAL_COMMUNITY)
Admission: RE | Disposition: A | Discharge status: Home / Self Care | Payer: Self-pay | Source: Ambulatory Visit | Attending: Ophthalmology

## 2012-12-10 DIAGNOSIS — Z01812 Encounter for preprocedural laboratory examination: Secondary | ICD-10-CM | POA: Insufficient documentation

## 2012-12-10 DIAGNOSIS — E119 Type 2 diabetes mellitus without complications: Secondary | ICD-10-CM | POA: Insufficient documentation

## 2012-12-10 DIAGNOSIS — H2589 Other age-related cataract: Secondary | ICD-10-CM | POA: Insufficient documentation

## 2012-12-10 DIAGNOSIS — I10 Essential (primary) hypertension: Secondary | ICD-10-CM | POA: Insufficient documentation

## 2012-12-10 HISTORY — PX: CATARACT EXTRACTION W/PHACO: SHX586

## 2012-12-10 SURGERY — PHACOEMULSIFICATION, CATARACT, WITH IOL INSERTION
Anesthesia: Monitor Anesthesia Care | Site: Eye | Laterality: Right | Wound class: Clean

## 2012-12-10 MED ORDER — MIDAZOLAM HCL 2 MG/2ML IJ SOLN
INTRAMUSCULAR | Status: AC
  Filled 2012-12-10: qty 2

## 2012-12-10 MED ORDER — FENTANYL CITRATE 0.05 MG/ML IJ SOLN
INTRAMUSCULAR | Status: AC
  Filled 2012-12-10: qty 2

## 2012-12-10 MED ORDER — NEOMYCIN-POLYMYXIN-DEXAMETH 0.1 % OP SUSP: OPHTHALMIC | Status: DC | PRN

## 2012-12-10 MED ORDER — PHENYLEPHRINE HCL 2.5 % OP SOLN
1.0000 [drp] | OPHTHALMIC | Status: AC
  Administered 2012-12-10 (×3): 1 [drp] via OPHTHALMIC

## 2012-12-10 MED ORDER — CYCLOPENTOLATE-PHENYLEPHRINE 0.2-1 % OP SOLN
1.0000 [drp] | OPHTHALMIC | Status: AC
  Administered 2012-12-10 (×3): 1 [drp] via OPHTHALMIC

## 2012-12-10 MED ORDER — LIDOCAINE HCL (PF) 1 % IJ SOLN
INTRAMUSCULAR | Status: DC | PRN
  Administered 2012-12-10: .5 mL

## 2012-12-10 MED ORDER — LIDOCAINE 3.5 % OP GEL OPTIME - NO CHARGE
OPHTHALMIC | Status: DC | PRN
  Administered 2012-12-10: 1 [drp] via OPHTHALMIC

## 2012-12-10 MED ORDER — TETRACAINE HCL 0.5 % OP SOLN
1.0000 [drp] | OPHTHALMIC | Status: AC
  Administered 2012-12-10 (×3): 1 [drp] via OPHTHALMIC

## 2012-12-10 MED ORDER — FENTANYL CITRATE 0.05 MG/ML IJ SOLN
25.0000 ug | INTRAMUSCULAR | Status: AC
  Administered 2012-12-10: 25 ug via INTRAVENOUS

## 2012-12-10 MED ORDER — EPINEPHRINE HCL 1 MG/ML IJ SOLN
INTRAMUSCULAR | Status: AC
  Filled 2012-12-10: qty 1

## 2012-12-10 MED ORDER — EPINEPHRINE HCL 1 MG/ML IJ SOLN
INTRAOCULAR | Status: DC | PRN
  Administered 2012-12-10: 10:00:00

## 2012-12-10 MED ORDER — LACTATED RINGERS IV SOLN
INTRAVENOUS | Status: DC
  Administered 2012-12-10: 09:00:00 via INTRAVENOUS

## 2012-12-10 MED ORDER — BSS IO SOLN
INTRAOCULAR | Status: DC | PRN
  Administered 2012-12-10: 15 mL via INTRAOCULAR

## 2012-12-10 MED ORDER — MIDAZOLAM HCL 2 MG/2ML IJ SOLN
1.0000 mg | INTRAMUSCULAR | Status: DC | PRN
  Administered 2012-12-10: 2 mg via INTRAVENOUS

## 2012-12-10 MED ORDER — POVIDONE-IODINE 5 % OP SOLN
OPHTHALMIC | Status: DC | PRN
  Administered 2012-12-10: 1 via OPHTHALMIC

## 2012-12-10 MED ORDER — PROVISC 10 MG/ML IO SOLN
INTRAOCULAR | Status: DC | PRN
  Administered 2012-12-10: 8.5 mg via INTRAOCULAR

## 2012-12-10 MED ORDER — LIDOCAINE HCL 3.5 % OP GEL: 1.0000 "application " | Freq: Once | OPHTHALMIC | Status: DC

## 2012-12-10 SURGICAL SUPPLY — 32 items

## 2012-12-10 NOTE — Op Note (Signed)
Date of Admission: 12/10/2012  Date of Surgery: 12/10/2012  Pre-Op Dx: Cataract  Right  Eye  Post-Op Dx: Combined Cataract  Right  Eye,  Dx Code 366.19  Surgeon: Tonny Branch, M.D.  Assistants: None  Anesthesia: Topical with MAC  Indications: Painless, progressive loss of vision with compromise of daily activities.  Surgery: Cataract Extraction with Intraocular lens Implant Right Eye  Discription: The patient had dilating drops and viscous lidocaine placed into the right eye in the pre-op holding area. After transfer to the operating room, a time out was performed. The patient was then prepped and draped. Beginning with a 79 degree blade a paracentesis port was made at the surgeon's 2 o'clock position. The anterior chamber was then filled with 1% non-preserved lidocaine. This was followed by filling the anterior chamber with Provisc.  A 2.48mm keratome blade was used to make a clear corneal incision at the temporal limbus.  A bent cystatome needle was used to create a continuous tear capsulotomy. Hydrodissection was performed with balanced salt solution on a Fine canula. The lens nucleus was then removed using the phacoemulsification handpiece. Residual cortex was removed with the I&A handpiece. The anterior chamber and capsular bag were refilled with Provisc. A posterior chamber intraocular lens was placed into the capsular bag with it's injector. The implant was positioned with the Kuglan hook. The Provisc was then removed from the anterior chamber and capsular bag with the I&A handpiece. Stromal hydration of the main incision and paracentesis port was performed with BSS on a Fine canula. The wounds were tested for leak which was negative. The patient tolerated the procedure well. There were no operative complications. The patient was then transferred to the recovery room in stable condition.  Complications: None  Specimen: None  EBL: None  Prosthetic device: B&L enVista, MX60, power 20.0D,  SN [id number].

## 2012-12-10 NOTE — Anesthesia Postprocedure Evaluation (Signed)
  Anesthesia Post-op Note  Patient: [name]  Procedure(s) Performed: Procedure(s) (LRB): CATARACT EXTRACTION PHACO AND INTRAOCULAR LENS PLACEMENT (IOC) (Right)  Patient Location:  Short Stay  Anesthesia Type: MAC  Level of Consciousness: awake  Airway and Oxygen Therapy: Patient Spontanous Breathing  Post-op Pain: none  Post-op Assessment: Post-op Vital signs reviewed, Patient's Cardiovascular Status Stable, Respiratory Function Stable, Patent Airway, No signs of Nausea or vomiting and Pain level controlled  Post-op Vital Signs: Reviewed and stable  Complications: No apparent anesthesia complications

## 2012-12-10 NOTE — Anesthesia Preprocedure Evaluation (Signed)
Anesthesia Evaluation  Patient identified by MRN, date of birth, ID band Patient awake    Reviewed: Allergy & Precautions, H&P , NPO status , Patient's Chart, lab work & pertinent test results  Airway Mallampati: II TM Distance: >3 FB Neck ROM: Full    Dental  (+) Edentulous Upper   Pulmonary asthma ,  breath sounds clear to auscultation        Cardiovascular hypertension, Pt. on medications Rhythm:Regular Rate:Normal     Neuro/Psych    GI/Hepatic   Endo/Other  diabetes, Poorly Controlled, Type 2, Oral Hypoglycemic AgentsMorbid obesity  Renal/GU      Musculoskeletal   Abdominal   Peds  Hematology   Anesthesia Other Findings   Reproductive/Obstetrics                           Anesthesia Physical Anesthesia Plan  ASA: III  Anesthesia Plan: MAC   Post-op Pain Management:    Induction: Intravenous  Airway Management Planned: Nasal Cannula  Additional Equipment:   Intra-op Plan:   Post-operative Plan:   Informed Consent: I have reviewed the patients History and Physical, chart, labs and discussed the procedure including the risks, benefits and alternatives for the proposed anesthesia with the patient or authorized representative who has indicated his/her understanding and acceptance.     Plan Discussed with:   Anesthesia Plan Comments:         Anesthesia Quick Evaluation

## 2012-12-10 NOTE — H&P (Signed)
I have reviewed the H&P, the patient was re-examined, and I have identified no interval changes in medical condition and plan of care since the history and physical of record  

## 2012-12-10 NOTE — Transfer of Care (Signed)
Immediate Anesthesia Transfer of Care Note  Patient: [name]  Procedure(s) Performed: Procedure(s) (LRB): CATARACT EXTRACTION PHACO AND INTRAOCULAR LENS PLACEMENT (IOC) (Right)  Patient Location: Shortstay  Anesthesia Type: MAC  Level of Consciousness: awake  Airway & Oxygen Therapy: Patient Spontanous Breathing   Post-op Assessment: Report given to PACU RN, Post -op Vital signs reviewed and stable and Patient moving all extremities  Post vital signs: Reviewed and stable  Complications: No apparent anesthesia complications

## 2012-12-11 ENCOUNTER — Encounter (HOSPITAL_COMMUNITY): Payer: Self-pay | Admitting: Ophthalmology

## 2012-12-11 MED FILL — Neomycin-Polymyxin-Dexamethasone Ophth Susp 0.1%: OPHTHALMIC | Qty: 5 | Status: AC

## 2012-12-12 ENCOUNTER — Encounter (HOSPITAL_COMMUNITY): Payer: Self-pay | Admitting: Ophthalmology

## 2012-12-12 MED ORDER — NEOMYCIN-POLYMYXIN-DEXAMETH 3.5-10000-0.1 OP SUSP
OPHTHALMIC | Status: DC | PRN
  Administered 2012-12-10: 1 [drp] via OPHTHALMIC

## 2013-03-31 HISTORY — PX: CARDIAC CATHETERIZATION: SHX172

## 2013-04-09 ENCOUNTER — Encounter (HOSPITAL_COMMUNITY)
Admission: RE | Disposition: A | Discharge status: Home / Self Care | Payer: Self-pay | Source: Ambulatory Visit | Attending: Cardiology

## 2013-04-09 ENCOUNTER — Ambulatory Visit (HOSPITAL_COMMUNITY)
Admission: RE | Admit: 2013-04-09 | Discharge: 2013-04-09 | Disposition: A | Discharge status: Home / Self Care | Payer: PRIVATE HEALTH INSURANCE | Source: Ambulatory Visit | Attending: Cardiology | Admitting: Cardiology

## 2013-04-09 DIAGNOSIS — E119 Type 2 diabetes mellitus without complications: Secondary | ICD-10-CM | POA: Insufficient documentation

## 2013-04-09 DIAGNOSIS — I10 Essential (primary) hypertension: Secondary | ICD-10-CM | POA: Insufficient documentation

## 2013-04-09 DIAGNOSIS — R079 Chest pain, unspecified: Secondary | ICD-10-CM | POA: Insufficient documentation

## 2013-04-09 DIAGNOSIS — E785 Hyperlipidemia, unspecified: Secondary | ICD-10-CM | POA: Insufficient documentation

## 2013-04-09 HISTORY — PX: LEFT HEART CATHETERIZATION WITH CORONARY ANGIOGRAM: SHX5451

## 2013-04-09 LAB — GLUCOSE, CAPILLARY
GLUCOSE-CAPILLARY: 308 mg/dL — AB (ref 70–99)
Glucose-Capillary: 356 mg/dL — ABNORMAL HIGH (ref 70–99)

## 2013-04-09 LAB — PREGNANCY, URINE: Preg Test, Ur: NEGATIVE

## 2013-04-09 SURGERY — LEFT HEART CATHETERIZATION WITH CORONARY ANGIOGRAM
Anesthesia: LOCAL

## 2013-04-09 MED ORDER — SODIUM CHLORIDE 0.9 % IJ SOLN: 3.0000 mL | Freq: Two times a day (BID) | INTRAMUSCULAR | Status: DC

## 2013-04-09 MED ORDER — NITROGLYCERIN 0.2 MG/ML ON CALL CATH LAB
INTRAVENOUS | Status: AC
  Filled 2013-04-09: qty 1

## 2013-04-09 MED ORDER — HEPARIN (PORCINE) IN NACL 2-0.9 UNIT/ML-% IJ SOLN
INTRAMUSCULAR | Status: AC
  Filled 2013-04-09: qty 1000

## 2013-04-09 MED ORDER — ONDANSETRON HCL 4 MG/2ML IJ SOLN: 4.0000 mg | Freq: Four times a day (QID) | INTRAMUSCULAR | Status: DC | PRN

## 2013-04-09 MED ORDER — MIDAZOLAM HCL 2 MG/2ML IJ SOLN
INTRAMUSCULAR | Status: AC
  Filled 2013-04-09: qty 2

## 2013-04-09 MED ORDER — ACETAMINOPHEN 325 MG PO TABS: 650.0000 mg | ORAL_TABLET | ORAL | Status: DC | PRN

## 2013-04-09 MED ORDER — SODIUM CHLORIDE 0.9 % IV SOLN: INTRAVENOUS | Status: DC

## 2013-04-09 MED ORDER — FENTANYL CITRATE 0.05 MG/ML IJ SOLN
INTRAMUSCULAR | Status: AC
  Filled 2013-04-09: qty 2

## 2013-04-09 MED ORDER — LIDOCAINE HCL (PF) 1 % IJ SOLN
INTRAMUSCULAR | Status: AC
  Filled 2013-04-09: qty 30

## 2013-04-09 MED ORDER — SODIUM CHLORIDE 0.9 % IV SOLN: 250.0000 mL | INTRAVENOUS | Status: DC | PRN

## 2013-04-09 MED ORDER — ASPIRIN 81 MG PO CHEW
81.0000 mg | CHEWABLE_TABLET | ORAL | Status: AC
Start: 2013-04-09 — End: 2013-04-09
  Administered 2013-04-09: 81 mg via ORAL
  Filled 2013-04-09: qty 1

## 2013-04-09 MED ORDER — VERAPAMIL HCL 2.5 MG/ML IV SOLN
INTRAVENOUS | Status: AC
  Filled 2013-04-09: qty 2

## 2013-04-09 MED ORDER — INSULIN ASPART 100 UNIT/ML ~~LOC~~ SOLN: 12.0000 [IU] | Freq: Once | SUBCUTANEOUS | Status: DC

## 2013-04-09 MED ORDER — SODIUM CHLORIDE 0.9 % IV SOLN: 1.0000 mL/kg/h | INTRAVENOUS | Status: DC

## 2013-04-09 MED ORDER — METFORMIN HCL 1000 MG PO TABS: 1000.0000 mg | ORAL_TABLET | Freq: Two times a day (BID) | ORAL | Status: DC

## 2013-04-09 MED ORDER — SODIUM CHLORIDE 0.9 % IJ SOLN: 3.0000 mL | INTRAMUSCULAR | Status: DC | PRN

## 2013-04-09 MED ORDER — INSULIN ASPART 100 UNIT/ML ~~LOC~~ SOLN
SUBCUTANEOUS | Status: AC
  Filled 2013-04-09: qty 1

## 2013-04-09 NOTE — Discharge Instructions (Signed)

## 2013-04-09 NOTE — H&P (Signed)
  Please see office visit notes for complete details of HPI.  

## 2013-04-09 NOTE — Interval H&P Note (Signed)
History and Physical Interval Note:  04/09/2013 7:50 AM  [name]  has presented today for surgery, with the diagnosis of Chest pain  The various methods of treatment have been discussed with the patient and family. After consideration of risks, benefits and other options for treatment, the patient has consented to  Procedure(s): LEFT HEART CATHETERIZATION WITH CORONARY ANGIOGRAM (N/A) right heart cath and possible PTCA as a surgical intervention .  The patient's history has been reviewed, patient examined, no change in status, stable for surgery.  I have reviewed the patient's chart and labs.  Questions were answered to the patient's satisfaction.   Cath Lab Visit (complete for each Cath Lab visit)  Clinical Evaluation Leading to the Procedure:   ACS: no  Non-ACS:    Anginal Classification: CCS III  Anti-ischemic medical therapy: Maximal Therapy (2 or more classes of medications)  Non-Invasive Test Results: No non-invasive testing performed  Prior CABG: No previous CABG        [facility]

## 2013-04-09 NOTE — CV Procedure (Signed)
Procedure performed:  Left heart catheterization including hemodynamic monitoring of the left ventricle, LV gram, selective right and left coronary arteriography.  Indication patient is a 48 year-old AA female with history of hypertension,  hyperlipidemia,  Diabetes Mellitus   who presents with chest pain. Due to morbid obesity, history of stress test about 2 years ago which was low risk, it is felt that possibly best option is to directly proceed with coronary angiography for definitive diagnosis. Hence is brought to the cardiac catheterization lab to evaluate the  coronary anatomy for definitive diagnosis of CAD.  Hemodynamic data:  Left ventricular pressure was 114/15 with LVEDP of 31 mm mercury. Aortic pressure was 113/70 with a mean of 88 mm mercury. There was no pressure gradient across the aortic valve  Left ventricle: Performed in the RAO projection revealed LVEF of 55%. There was No significant MR. No wall motion abnormality.  Right coronary artery: The vessel is smooth, normal,  Dominant.  Left main coronary artery is large and normal.  Circumflex coronary artery: A large vessel giving origin to a large obtuse marginal 1. It is smooth and normal.   LAD:  LAD gives origin to a large diagonal-1. Normal.  Impression: Normal coronary arteries, markedly elevated LVEDP secondary to diastolic dysfunction and morbid obesity and hypertension and diabetes mellitus.  Recommendation: Evaluation for noncardiac cause of chest pain is indicated. Critical primary prevention strategies indicated. A total of 50 cc of contrast was less for diagnostic angiography   Technique: Under sterile precautions using a 6 French right radial  arterial access, a 6 French sheath was introduced into the right radial artery. A 5 [place] Tig 4 catheter was advanced into the ascending aorta selective  right coronary artery and left coronary artery was cannulated and angiography was performed in multiple views. The  catheter was pulled back Out of the body over exchange length J-wire.  SameCatheter was used to perform LV gram which was performed in LAO projection. Catheter exchanged out of the body over J-Wire. NO immediate complications noted. Patient tolerated the procedure well.   Disposition: Will be discharged home today with outpatient follow up.

## 2013-04-09 NOTE — Progress Notes (Signed)
CBG 356.  [place] notified.  Will continue to monitor.

## 2013-10-11 ENCOUNTER — Other Ambulatory Visit (HOSPITAL_COMMUNITY): Payer: Self-pay | Admitting: Family Medicine

## 2013-10-11 DIAGNOSIS — Z1231 Encounter for screening mammogram for malignant neoplasm of breast: Secondary | ICD-10-CM

## 2013-10-11 DIAGNOSIS — Z139 Encounter for screening, unspecified: Secondary | ICD-10-CM

## 2013-10-16 ENCOUNTER — Ambulatory Visit (HOSPITAL_COMMUNITY): Payer: Medicare Other

## 2013-10-18 ENCOUNTER — Ambulatory Visit (HOSPITAL_COMMUNITY)
Admission: RE | Admit: 2013-10-18 | Discharge: 2013-10-18 | Disposition: A | Discharge status: Home / Self Care | Payer: PRIVATE HEALTH INSURANCE | Source: Ambulatory Visit | Attending: Family Medicine | Admitting: Family Medicine

## 2013-10-18 DIAGNOSIS — Z1231 Encounter for screening mammogram for malignant neoplasm of breast: Secondary | ICD-10-CM | POA: Insufficient documentation

## 2013-11-18 ENCOUNTER — Encounter (HOSPITAL_COMMUNITY): Payer: Self-pay | Admitting: Emergency Medicine

## 2013-11-18 ENCOUNTER — Emergency Department (HOSPITAL_COMMUNITY)
Admission: EM | Admit: 2013-11-18 | Discharge: 2013-11-18 | Disposition: A | Discharge status: Home / Self Care | Payer: PRIVATE HEALTH INSURANCE | Attending: Emergency Medicine | Admitting: Emergency Medicine

## 2013-11-18 DIAGNOSIS — N289 Disorder of kidney and ureter, unspecified: Secondary | ICD-10-CM | POA: Diagnosis not present

## 2013-11-18 DIAGNOSIS — Z79899 Other long term (current) drug therapy: Secondary | ICD-10-CM | POA: Diagnosis not present

## 2013-11-18 DIAGNOSIS — Z7982 Long term (current) use of aspirin: Secondary | ICD-10-CM | POA: Insufficient documentation

## 2013-11-18 DIAGNOSIS — E119 Type 2 diabetes mellitus without complications: Secondary | ICD-10-CM | POA: Diagnosis not present

## 2013-11-18 DIAGNOSIS — J45909 Unspecified asthma, uncomplicated: Secondary | ICD-10-CM | POA: Diagnosis not present

## 2013-11-18 DIAGNOSIS — R42 Dizziness and giddiness: Secondary | ICD-10-CM | POA: Diagnosis present

## 2013-11-18 DIAGNOSIS — E785 Hyperlipidemia, unspecified: Secondary | ICD-10-CM | POA: Diagnosis not present

## 2013-11-18 DIAGNOSIS — N390 Urinary tract infection, site not specified: Secondary | ICD-10-CM | POA: Diagnosis not present

## 2013-11-18 DIAGNOSIS — IMO0002 Reserved for concepts with insufficient information to code with codable children: Secondary | ICD-10-CM | POA: Diagnosis not present

## 2013-11-18 DIAGNOSIS — I10 Essential (primary) hypertension: Secondary | ICD-10-CM | POA: Insufficient documentation

## 2013-11-18 LAB — URINALYSIS, ROUTINE W REFLEX MICROSCOPIC
BILIRUBIN URINE: NEGATIVE
Glucose, UA: NEGATIVE mg/dL
Ketones, ur: NEGATIVE mg/dL
NITRITE: NEGATIVE
SPECIFIC GRAVITY, URINE: 1.02 (ref 1.005–1.030)
UROBILINOGEN UA: 0.2 mg/dL (ref 0.0–1.0)
pH: 5.5 (ref 5.0–8.0)

## 2013-11-18 LAB — BASIC METABOLIC PANEL
ANION GAP: 14 (ref 5–15)
BUN: 61 mg/dL — ABNORMAL HIGH (ref 6–23)
CALCIUM: 9 mg/dL (ref 8.4–10.5)
CO2: 26 meq/L (ref 19–32)
Chloride: 98 mEq/L (ref 96–112)
Creatinine, Ser: 1.99 mg/dL — ABNORMAL HIGH (ref 0.50–1.10)
GFR calc Af Amer: 33 mL/min — ABNORMAL LOW (ref >90)
GFR calc non Af Amer: 29 mL/min — ABNORMAL LOW (ref >90)
Glucose, Bld: 73 mg/dL (ref 70–99)
Potassium: 4.7 mEq/L (ref 3.7–5.3)
Sodium: 138 mEq/L (ref 137–147)

## 2013-11-18 LAB — CBC WITH DIFFERENTIAL/PLATELET
BASOS ABS: 0.1 10*3/uL (ref 0.0–0.1)
BASOS PCT: 0 % (ref 0–1)
Eosinophils Absolute: 0.5 10*3/uL (ref 0.0–0.7)
Eosinophils Relative: 4 % (ref 0–5)
HEMATOCRIT: 35.5 % — AB (ref 36.0–46.0)
Hemoglobin: 11.8 g/dL — ABNORMAL LOW (ref 12.0–15.0)
LYMPHS PCT: 24 % (ref 12–46)
Lymphs Abs: 2.7 10*3/uL (ref 0.7–4.0)
MCH: 27.6 pg (ref 26.0–34.0)
MCHC: 33.2 g/dL (ref 30.0–36.0)
MCV: 82.9 fL (ref 78.0–100.0)
MONO ABS: 0.7 10*3/uL (ref 0.1–1.0)
Monocytes Relative: 6 % (ref 3–12)
NEUTROS ABS: 7.4 10*3/uL (ref 1.7–7.7)
Neutrophils Relative %: 66 % (ref 43–77)
Platelets: 201 10*3/uL (ref 150–400)
RBC: 4.28 MIL/uL (ref 3.87–5.11)
RDW: 14 % (ref 11.5–15.5)
WBC: 11.3 10*3/uL — ABNORMAL HIGH (ref 4.0–10.5)

## 2013-11-18 LAB — URINE MICROSCOPIC-ADD ON

## 2013-11-18 LAB — CBG MONITORING, ED: GLUCOSE-CAPILLARY: 82 mg/dL (ref 70–99)

## 2013-11-18 MED ORDER — NITROFURANTOIN MONOHYD MACRO 100 MG PO CAPS
100.0000 mg | ORAL_CAPSULE | Freq: Once | ORAL | Status: AC
  Administered 2013-11-18: 100 mg via ORAL
  Filled 2013-11-18: qty 1

## 2013-11-18 MED ORDER — NITROFURANTOIN MONOHYD MACRO 100 MG PO CAPS: 100.0000 mg | ORAL_CAPSULE | Freq: Two times a day (BID) | ORAL | Status: DC

## 2013-11-18 NOTE — ED Provider Notes (Addendum)
CSN: [id number]     Arrival date & time 11/18/13  1727 History   This chart was scribed for Nat Christen, MD by Evelene Croon, ED Scribe. This patient was seen in room [facility] and the patient's care was started 6:09 PM.   Chief Complaint  Patient presents with  . Dizziness    The history is provided by the patient. No language interpreter was used.    HPI Comments:  [name] is a 48 y.o. female with a h/o DM, who presents to the Emergency Department complaining of dizziness that started while at a wake this afternoon. Pt reports associated nausea and generalized weakness. Denies LOC. Pt sts BS is normally on the higher side but was lower than normal this afternoon. Pt denies change/decrease in appetite. Pt also denies fever, chills and cough. Pt's sister notes pt has not been feeling well for the past 3 days. No alleviating factors noted but pt states she feels better at this time.    Past Medical History  Diagnosis Date  . Asthma   . Hypertension   . Diabetes mellitus   . Hyperlipidemia    Past Surgical History  Procedure Laterality Date  . Tracheostomy      at age 27 from asthma attack  . Cataract extraction w/phaco Right 12/10/2012    Procedure: CATARACT EXTRACTION PHACO AND INTRAOCULAR LENS PLACEMENT (IOC);  Surgeon: Tonny Branch, MD;  Location: [facility];  Service: Ophthalmology;  Laterality: Right;  CDE:22..42   No family history on file. History  Substance Use Topics  . Smoking status: Never Smoker   . Smokeless tobacco: Not on file  . Alcohol Use: Yes     Comment: occasional   OB History   Grav Para Term Preterm Abortions TAB SAB Ect Mult Living                 Review of Systems At least 10pt or greater review of systems completed and are negative except where specified in the HPI.     Allergies  Review of patient's allergies indicates no known allergies.  Home Medications   Prior to Admission medications   Medication Sig Start Date End Date  Taking? Authorizing Provider  albuterol (PROVENTIL HFA;VENTOLIN HFA) 108 (90 BASE) MCG/ACT inhaler Inhale 2 puffs into the lungs every 6 (six) hours as needed for wheezing.   Yes Historical Provider, MD  Alogliptin-Pioglitazone (OSENI) 25-15 MG TABS Take 1 tablet by mouth daily.   Yes Historical Provider, MD  aspirin EC 81 MG tablet Take 81 mg by mouth daily.    Yes Historical Provider, MD  atorvastatin (LIPITOR) 20 MG tablet Take 20 mg by mouth at bedtime.    Yes Historical Provider, MD  carvedilol (COREG) 6.25 MG tablet Take 1 tablet by mouth 2 (two) times daily. 11/08/13  Yes Historical Provider, MD  Fluticasone-Salmeterol (ADVAIR) 100-50 MCG/DOSE AEPB Inhale 1 puff into the lungs every 12 (twelve) hours as needed (for shortness of breath).    Yes Historical Provider, MD  furosemide (LASIX) 40 MG tablet Take 40 mg by mouth 2 (two) times daily.    Yes Historical Provider, MD  gabapentin (NEURONTIN) 300 MG capsule Take 300 mg by mouth 3 (three) times daily.    Yes Historical Provider, MD  Insulin Isophane & Regular Human (HUMULIN 70/30 KWIKPEN) (70-30) 100 UNIT/ML PEN Inject 48 Units into the skin 2 (two) times daily.   Yes Historical Provider, MD  metFORMIN (GLUCOPHAGE) 1000 MG tablet Take 1 tablet (1,000 mg  total) by mouth 2 (two) times daily with a meal. 04/10/13  Yes Laverda Page, MD  montelukast (SINGULAIR) 10 MG tablet Take 10 mg by mouth 2 (two) times daily.    Yes Historical Provider, MD  nitroGLYCERIN (NITROSTAT) 0.4 MG SL tablet Place 0.4 mg under the tongue every 5 (five) minutes as needed for chest pain.   Yes Historical Provider, MD  potassium chloride (K-DUR,KLOR-CON) 10 MEQ tablet Take 10 mEq by mouth 2 (two) times daily.   Yes Historical Provider, MD  valsartan-hydrochlorothiazide (DIOVAN-HCT) 320-25 MG per tablet Take 1 tablet by mouth daily.   Yes Historical Provider, MD  ibuprofen (ADVIL,MOTRIN) 800 MG tablet Take 800 mg by mouth every 8 (eight) hours as needed for moderate  pain. Pain    Historical Provider, MD  nitrofurantoin, macrocrystal-monohydrate, (MACROBID) 100 MG capsule Take 1 capsule (100 mg total) by mouth 2 (two) times daily. X 7 days 11/18/13   Nat Christen, MD  tetrahydrozoline 0.05 % ophthalmic solution Place 1 drop into both eyes daily as needed. Dry, Itchy Eyes    Historical Provider, MD   BP 97/57  Pulse 79  Temp(Src) 97.9 F (36.6 C)  Resp 19  Ht 5' (1.524 m)  Wt 334 lb (151.501 kg)  BMI 65.23 kg/m2  SpO2 100%  LMP 08/28/2013 Physical Exam  Nursing note and vitals reviewed. Constitutional: She is oriented to person, place, and time.  Obese   HENT:  Head: Normocephalic and atraumatic.  Eyes: Conjunctivae and EOM are normal. Pupils are equal, round, and reactive to light.  Neck: Normal range of motion. Neck supple.  Cardiovascular: Normal rate, regular rhythm and normal heart sounds.   Pulmonary/Chest: Effort normal and breath sounds normal.  Abdominal: Soft. Bowel sounds are normal.  Musculoskeletal: Normal range of motion.  Neurological: She is alert and oriented to person, place, and time.  Skin: Skin is warm and dry.  Psychiatric: She has a normal mood and affect. Her behavior is normal.    ED Course  Procedures   DIAGNOSTIC STUDIES:  Oxygen Saturation is 100% on RA, normal by my interpretation.    COORDINATION OF CARE:  6:17 PM Discussed treatment plan with pt at bedside and pt agreed to plan.  Labs Review Labs Reviewed  BASIC METABOLIC PANEL - Abnormal; Notable for the following:    BUN 61 (*)    Creatinine, Ser 1.99 (*)    GFR calc non Af Amer 29 (*)    GFR calc Af Amer 33 (*)    All other components within normal limits  CBC WITH DIFFERENTIAL - Abnormal; Notable for the following:    WBC 11.3 (*)    Hemoglobin 11.8 (*)    HCT 35.5 (*)    All other components within normal limits  URINALYSIS, ROUTINE W REFLEX MICROSCOPIC - Abnormal; Notable for the following:    APPearance HAZY (*)    Hgb urine dipstick SMALL  (*)    Protein, ur TRACE (*)    Leukocytes, UA MODERATE (*)    All other components within normal limits  URINE MICROSCOPIC-ADD ON - Abnormal; Notable for the following:    Squamous Epithelial / LPF MANY (*)    Bacteria, UA MANY (*)    All other components within normal limits  CBG MONITORING, ED    Imaging Review No results found.   EKG Interpretation None      Date: 11/18/2013  Rate: 79  Rhythm: normal sinus rhythm  QRS Axis: normal  Intervals: normal  ST/T Wave abnormalities: normal  Conduction Disutrbances: none  Narrative Interpretation: unremarkable    MDM   Final diagnoses:  UTI (lower urinary tract infection)  Renal insufficiency    Patient is in no acute distress. Urinalysis shows too numerous to count white cells. Rx Macrobid for one week. Elevated creatinine noted. Discussed with patient. She will follow up with her primary care Dr. for a repeat creatinine check in 1-2 weeks   I personally performed the services described in this documentation, which was scribed in my presence. The recorded information has been reviewed and is accurate.    Nat Christen, MD 11/18/13 [id number]  Nat Christen, MD 11/18/13 2130

## 2013-11-18 NOTE — ED Notes (Signed)
MD at the bedside  

## 2013-11-18 NOTE — ED Notes (Signed)
Pt reports feeling some better.  Tolerating p.o intake without difficulty.

## 2013-11-18 NOTE — Discharge Instructions (Signed)
You have a urinary tract infection. Increase fluids. Antibiotic for one week. Also your kidney test called creatinine was elevated.  This will need to be rechecked in one to two weeks

## 2013-11-18 NOTE — ED Notes (Signed)
Lab at the bedside 

## 2013-11-18 NOTE — ED Notes (Signed)
Pt c/o intermittent dizziness/ha/n since 2230 last night.

## 2013-11-18 NOTE — ED Notes (Signed)
Low blood sugar 82 at the bedside, complaining of headache and nausea,

## 2013-11-18 NOTE — ED Notes (Signed)
Pt eating peanut butter cracker and drinking apple juice.

## 2013-11-18 NOTE — ED Notes (Signed)
Family at the bedside, pt state she feels better after eating and drinking, lab at the bedside

## 2014-02-06 ENCOUNTER — Encounter (HOSPITAL_COMMUNITY): Payer: Self-pay | Admitting: Cardiology

## 2014-02-06 ENCOUNTER — Emergency Department (HOSPITAL_COMMUNITY)
Admission: EM | Admit: 2014-02-06 | Discharge: 2014-02-06 | Disposition: A | Discharge status: Home / Self Care | Payer: PRIVATE HEALTH INSURANCE | Attending: Emergency Medicine | Admitting: Emergency Medicine

## 2014-02-06 ENCOUNTER — Emergency Department (HOSPITAL_COMMUNITY): Payer: PRIVATE HEALTH INSURANCE

## 2014-02-06 DIAGNOSIS — Z7982 Long term (current) use of aspirin: Secondary | ICD-10-CM | POA: Diagnosis not present

## 2014-02-06 DIAGNOSIS — I10 Essential (primary) hypertension: Secondary | ICD-10-CM | POA: Diagnosis not present

## 2014-02-06 DIAGNOSIS — R1110 Vomiting, unspecified: Secondary | ICD-10-CM | POA: Diagnosis present

## 2014-02-06 DIAGNOSIS — E785 Hyperlipidemia, unspecified: Secondary | ICD-10-CM | POA: Insufficient documentation

## 2014-02-06 DIAGNOSIS — Z9889 Other specified postprocedural states: Secondary | ICD-10-CM | POA: Diagnosis not present

## 2014-02-06 DIAGNOSIS — J069 Acute upper respiratory infection, unspecified: Secondary | ICD-10-CM | POA: Insufficient documentation

## 2014-02-06 DIAGNOSIS — Z7951 Long term (current) use of inhaled steroids: Secondary | ICD-10-CM | POA: Insufficient documentation

## 2014-02-06 DIAGNOSIS — Z794 Long term (current) use of insulin: Secondary | ICD-10-CM | POA: Diagnosis not present

## 2014-02-06 DIAGNOSIS — J45909 Unspecified asthma, uncomplicated: Secondary | ICD-10-CM | POA: Diagnosis not present

## 2014-02-06 DIAGNOSIS — E119 Type 2 diabetes mellitus without complications: Secondary | ICD-10-CM | POA: Diagnosis not present

## 2014-02-06 DIAGNOSIS — B349 Viral infection, unspecified: Secondary | ICD-10-CM | POA: Diagnosis not present

## 2014-02-06 DIAGNOSIS — Z79899 Other long term (current) drug therapy: Secondary | ICD-10-CM | POA: Insufficient documentation

## 2014-02-06 LAB — I-STAT CHEM 8, ED
BUN: 30 mg/dL — AB (ref 6–23)
CREATININE: 1 mg/dL (ref 0.50–1.10)
Calcium, Ion: 1.21 mmol/L (ref 1.12–1.23)
Chloride: 105 mEq/L (ref 96–112)
Glucose, Bld: 119 mg/dL — ABNORMAL HIGH (ref 70–99)
HCT: 36 % (ref 36.0–46.0)
Hemoglobin: 12.2 g/dL (ref 12.0–15.0)
Potassium: 5.1 mEq/L (ref 3.7–5.3)
Sodium: 142 mEq/L (ref 137–147)
TCO2: 25 mmol/L (ref 0–100)

## 2014-02-06 LAB — RAPID STREP SCREEN (MED CTR MEBANE ONLY): Streptococcus, Group A Screen (Direct): NEGATIVE

## 2014-02-06 MED ORDER — BENZONATATE 100 MG PO CAPS: 100.0000 mg | ORAL_CAPSULE | Freq: Three times a day (TID) | ORAL | Status: DC | PRN

## 2014-02-06 MED ORDER — ALBUTEROL SULFATE (2.5 MG/3ML) 0.083% IN NEBU
2.5000 mg | INHALATION_SOLUTION | Freq: Once | RESPIRATORY_TRACT | Status: AC
  Administered 2014-02-06: 2.5 mg via RESPIRATORY_TRACT
  Filled 2014-02-06: qty 3

## 2014-02-06 MED ORDER — IPRATROPIUM-ALBUTEROL 0.5-2.5 (3) MG/3ML IN SOLN
3.0000 mL | Freq: Once | RESPIRATORY_TRACT | Status: AC
  Administered 2014-02-06: 3 mL via RESPIRATORY_TRACT
  Filled 2014-02-06: qty 3

## 2014-02-06 NOTE — ED Provider Notes (Signed)
CSN: [id number]     Arrival date & time 02/06/14  1626 History   First MD Initiated Contact with Patient 02/06/14 1836     Chief Complaint  Patient presents with  . Emesis      HPI Pt was seen at 1900.  Per pt, c/o gradual onset and persistence of constant sore throat, runny/stuffy nose, sinus congestion, and cough for the past 3 weeks. Pt states she occasionally hears herself "wheeze." Pt states today she had an episode of N/V.  Denies fevers, no rash, no CP/SOB, no diarrhea, no abd pain.    Past Medical History  Diagnosis Date  . Asthma   . Hypertension   . Diabetes mellitus   . Hyperlipidemia    Past Surgical History  Procedure Laterality Date  . Tracheostomy      at age 89 from asthma attack  . Cataract extraction w/phaco Right 12/10/2012    Procedure: CATARACT EXTRACTION PHACO AND INTRAOCULAR LENS PLACEMENT (IOC);  Surgeon: Tonny Branch, MD;  Location: [facility];  Service: Ophthalmology;  Laterality: Right;  CDE:22..42  . Left heart catheterization with coronary angiogram N/A 04/09/2013    Procedure: LEFT HEART CATHETERIZATION WITH CORONARY ANGIOGRAM;  Surgeon: Laverda Page, MD;  Location: [facility] CATH LAB;  Service: Cardiovascular;  Laterality: N/A;  . Cardiac catheterization  03/2013    normal coronary arteries, EF 55%    History  Substance Use Topics  . Smoking status: Never Smoker   . Smokeless tobacco: Not on file  . Alcohol Use: Yes     Comment: occasional    Review of Systems ROS: Statement: All systems negative except as marked or noted in the HPI; Constitutional: Negative for fever and chills. ; ; Eyes: Negative for eye pain, redness and discharge. ; ; ENMT: Negative for ear pain, hoarseness, +nasal congestion, sinus pressure and sore throat. ; ; Cardiovascular: Negative for chest pain, palpitations, diaphoresis, dyspnea and peripheral edema. ; ; Respiratory: +cough, occasional wheezing. Negative for stridor. ; ; Gastrointestinal: +N/V. Negative for diarrhea, abdominal  pain, blood in stool, hematemesis, jaundice and rectal bleeding. . ; ; Genitourinary: Negative for dysuria, flank pain and hematuria. ; ; Musculoskeletal: Negative for back pain and neck pain. Negative for swelling and trauma.; ; Skin: Negative for pruritus, rash, abrasions, blisters, bruising and skin lesion.; ; Neuro: Negative for headache, lightheadedness and neck stiffness. Negative for weakness, altered level of consciousness , altered mental status, extremity weakness, paresthesias, involuntary movement, seizure and syncope.     Allergies  Review of patient's allergies indicates no known allergies.  Home Medications   Prior to Admission medications   Medication Sig Start Date End Date Taking? Authorizing Provider  albuterol (PROVENTIL HFA;VENTOLIN HFA) 108 (90 BASE) MCG/ACT inhaler Inhale 2 puffs into the lungs every 6 (six) hours as needed for wheezing.   Yes Historical Provider, MD  albuterol (PROVENTIL) (2.5 MG/3ML) 0.083% nebulizer solution Take 2.5 mg by nebulization every 4 (four) hours as needed for wheezing or shortness of breath.   Yes Historical Provider, MD  Alogliptin-Pioglitazone (OSENI) 25-15 MG TABS Take 1 tablet by mouth daily.   Yes Historical Provider, MD  aspirin EC 81 MG tablet Take 81 mg by mouth daily.    Yes Historical Provider, MD  atorvastatin (LIPITOR) 20 MG tablet Take 20 mg by mouth at bedtime.    Yes Historical Provider, MD  carvedilol (COREG) 6.25 MG tablet Take 1 tablet by mouth 2 (two) times daily. 11/08/13  Yes Historical Provider, MD  Fluticasone-Salmeterol (ADVAIR)  100-50 MCG/DOSE AEPB Inhale 1 puff into the lungs every 12 (twelve) hours as needed (for shortness of breath).    Yes Historical Provider, MD  furosemide (LASIX) 40 MG tablet Take 40 mg by mouth 2 (two) times daily.    Yes Historical Provider, MD  gabapentin (NEURONTIN) 300 MG capsule Take 300 mg by mouth 3 (three) times daily.    Yes Historical Provider, MD  Insulin Isophane & Regular Human  (HUMULIN 70/30 KWIKPEN) (70-30) 100 UNIT/ML PEN Inject 48 Units into the skin 2 (two) times daily.   Yes Historical Provider, MD  metFORMIN (GLUCOPHAGE) 1000 MG tablet Take 1 tablet (1,000 mg total) by mouth 2 (two) times daily with a meal. 04/10/13  Yes Laverda Page, MD  montelukast (SINGULAIR) 10 MG tablet Take 10 mg by mouth daily.    Yes Historical Provider, MD  potassium chloride (K-DUR,KLOR-CON) 10 MEQ tablet Take 10 mEq by mouth 2 (two) times daily.   Yes Historical Provider, MD  valsartan-hydrochlorothiazide (DIOVAN-HCT) 320-25 MG per tablet Take 1 tablet by mouth daily.   Yes Historical Provider, MD  ibuprofen (ADVIL,MOTRIN) 800 MG tablet Take 800 mg by mouth every 8 (eight) hours as needed for moderate pain. Pain    Historical Provider, MD  nitrofurantoin, macrocrystal-monohydrate, (MACROBID) 100 MG capsule Take 1 capsule (100 mg total) by mouth 2 (two) times daily. X 7 days Patient not taking: Reported on 02/06/2014 11/18/13   Nat Christen, MD  nitroGLYCERIN (NITROSTAT) 0.4 MG SL tablet Place 0.4 mg under the tongue every 5 (five) minutes as needed for chest pain.    Historical Provider, MD  tetrahydrozoline 0.05 % ophthalmic solution Place 1 drop into both eyes daily as needed. Dry, Itchy Eyes    Historical Provider, MD   BP 113/92 mmHg  Pulse 78  Temp(Src) 97.7 F (36.5 C) (Oral)  Resp 14  Ht 5' (1.524 m)  Wt 378 lb 14.4 oz (171.868 kg)  BMI 74.00 kg/m2  SpO2 100% Physical Exam  1905: Physical examination:  Nursing notes reviewed; Vital signs and O2 SAT reviewed;  Constitutional: Well developed, Well nourished, Well hydrated, In no acute distress; Head:  Normocephalic, atraumatic; Eyes: EOMI, PERRL, No scleral icterus; ENMT: TM's clear bilat. +edemetous nasal turbinates bilat with clear rhinorrhea. Mouth and pharynx without lesions. No tonsillar exudates. No intra-oral edema. No submandibular or sublingual edema. No hoarse voice, no drooling, no stridor. No pain with manipulation  of larynx. No trismus. Mouth and pharynx normal, Mucous membranes moist; Neck: Supple, Full range of motion, No lymphadenopathy; Cardiovascular: Regular rate and rhythm, No gallop; Respiratory: Breath sounds coarse & equal bilaterally, No wheezes.  Speaking full sentences with ease, Normal respiratory effort/excursion; Chest: Nontender, Movement normal; Abdomen: Soft, Nontender, Nondistended, Normal bowel sounds; Genitourinary: No CVA tenderness; Extremities: Pulses normal, No tenderness, No edema, No calf edema or asymmetry.; Neuro: AA&Ox3, Major CN grossly intact.  Speech clear. No gross focal motor or sensory deficits in extremities.; Skin: Color normal, Warm, Dry.   ED Course  Procedures     MDM  MDM Reviewed: previous chart, nursing note and vitals Reviewed previous: labs Interpretation: labs and x-ray      Results for orders placed or performed during the hospital encounter of 02/06/14  Rapid strep screen  Result Value Ref Range   Streptococcus, Group A Screen (Direct) NEGATIVE NEGATIVE  I-stat Chem 8, ED  Result Value Ref Range   Sodium 142 137 - 147 mEq/L   Potassium 5.1 3.7 - 5.3 mEq/L  Chloride 105 96 - 112 mEq/L   BUN 30 (H) 6 - 23 mg/dL   Creatinine, Ser 1.00 0.50 - 1.10 mg/dL   Glucose, Bld 119 (H) 70 - 99 mg/dL   Calcium, Ion 1.21 1.12 - 1.23 mmol/L   TCO2 25 0 - 100 mmol/L   Hemoglobin 12.2 12.0 - 15.0 g/dL   HCT 36.0 36.0 - 46.0 %   Dg Chest 2 View 02/06/2014   CLINICAL DATA:  Short of breath. Cough. Chest pain. Hypertension and diabetes.  EXAM: CHEST  2 VIEW  COMPARISON:  12/29/2010.  FINDINGS: The cardiopericardial silhouette is enlarged. There is pulmonary vascular congestion and interstitial pulmonary edema. No pleural effusions. Lateral view is degraded by obese body habitus.  IMPRESSION: Mild CHF.   Electronically Signed   By: Dereck Ligas M.D.   On: 02/06/2014 20:18    2140:  Pt has had recent cardiac cath with normal EF; doubt acute CHF on CXR.  Pt  has received neb treatment, Sats 100% R/A, lungs CTA bilat. Pt has tol PO fast food meal well without N/V. Pt dropped her urine sample and cannot provide another at this time. Pt feels she is ready to go home now. Will cancel urine, pt denies dysuria. Will tx symptomatically at this time. Dx and testing d/w pt and family.  Questions answered.  Verb understanding, agreeable to d/c home with outpt f/u.    Francine Graven, DO 02/09/14 [phone]

## 2014-02-06 NOTE — ED Notes (Signed)
Pt tried to use the restroom. Got a little bit of urine and dropped it in the sink.

## 2014-02-06 NOTE — Discharge Instructions (Signed)
°Emergency Department Resource Guide °1) Find a Doctor and Pay Out of Pocket °Although you won't have to find out who is covered by your insurance plan, it is a good idea to ask around and get recommendations. You will then need to call the office and see if the doctor you have chosen will accept you as a new patient and what types of options they offer for patients who are self-pay. Some doctors offer discounts or will set up payment plans for their patients who do not have insurance, but you will need to ask so you aren't surprised when you get to your appointment. ° °2) Contact Your Local Health Department °Not all health departments have doctors that can see patients for sick visits, but many do, so it is worth a call to see if yours does. If you don't know where your local health department is, you can check in your phone book. The CDC also has a tool to help you locate your state's health department, and many state websites also have listings of all of their local health departments. ° °3) Find a Walk-in Clinic °If your illness is not likely to be very severe or complicated, you may want to try a walk in clinic. These are popping up all over the country in pharmacies, drugstores, and shopping centers. They're usually staffed by nurse practitioners or physician assistants that have been trained to treat common illnesses and complaints. They're usually fairly quick and inexpensive. However, if you have serious medical issues or chronic medical problems, these are probably not your best option. ° °No Primary Care Doctor: °- Call [facility] at  832-8000 - they can help you locate a primary care doctor that  accepts your insurance, provides certain services, etc. °- Physician Referral Service- 1-800-533-3463 ° °Chronic Pain Problems: °Organization         Address  Phone   Notes  °[facility]  ([phone]) [phone] Patients need to be referred by their primary care doctor.  ° °Medication  Assistance: °Organization         Address  Phone   Notes  °[place] [facility] [address] °[place], [place] [address] (336) 641-8030 --Must be a resident of [place] °-- Must have NO insurance coverage whatsoever (no Medicaid/ Medicare, etc.) °-- The pt. MUST have a primary care doctor that directs their care regularly and follows them in the community °  °[facility]  (866) 331-1348   °[employer]  (888) 892-1162   ° °Agencies that provide inexpensive medical care: °Organization         Address  Phone   Notes  °[facility]  (336) 832-8035   °[facility]    (336) 832-7272   °[facility] [address] °[place], [place] [address] (336) 832-4777   °[facility] [address], °[place] (336) 271-4999   °[facility]    (336) 373-0678   °[facility]    (336) 272-1050   °[facility] ° [address], [place] Phone:  (336) 832-4444, Fax:  (336) 832-4440 Hours of Operation:  9 am - 6 pm, M-F.  Also accepts Medicaid/Medicare and self-pay.  °[facility] ° [address], [place] Phone: (336) [phone], Fax: (336) [phone]. Hours of Operation:  8:30 am - 5:30 pm, M-F.  Also accepts Medicaid and self-pay.  °[facility] [place] [address], [place] Phone: (336) 878-6027   °[facility] [address], [place], [place] (336)723-1848, Ext. 123 Mondays & Thursdays: 7-9 AM.  First 15 patients are seen on a first come, first serve basis. °  ° °Medicaid-accepting [place] Providers: ° °Organization         Address  Phone   Notes  °[facility] [address], [place] (336) 641-2100 Also accepts self-pay patients.  °[facility] [address], [place] ° (336) 856-9996   °[facility] [address], [place]  (336) 288-8857   °[facility] [address] [place] [address], [place] (336) 299-7000   °Veita Bland [address], [place]  ° (336) 373-1557 Only accepts [place] Access Medicaid patients after they have their name applied to their card.  ° °Self-Pay (no insurance) in [place]: ° °Organization         Address  Phone   Notes  °Sickle Cell Patients, [facility] [address], [place] (336) 832-1970   °[facility] [address], [place] (336) 832-4400   °[facility] ° [address], [place] (336) 992-4800   °[facility]/Dr. Osei-Bonsu ° [address] [place] [address], [place] or [address], [place] (336) [phone] Phone number for both [place] and [place] locations is the same.  °[facility] [address], [place] (336) 299-0000   °[facility] [address] [place] [address], [place] or [address] (336) 852-7530 °(336) 878-2260   °[facility] [address], [place] (336) 350-1642, phone; (336) 294-5005, fax Sees patients 1st and 3rd Saturday of every month.  Must not qualify for public or private insurance (i.e. Medicaid, Medicare, [place] Health Choice, Veterans' Benefits) • Household income should be no more than 200% of the poverty level •The clinic cannot treat you if you are pregnant or think you are pregnant • Sexually transmitted diseases are not treated at the clinic.  ° ° °Dental Care: °Organization         Address  Phone  Notes  °[place] [facility] [address], [place] (336) 641-6152 Accepts children up to age 21 who are enrolled in Medicaid or [place] Health Choice; pregnant women with a Medicaid card; and children who have applied for Medicaid or [place] Health Choice, but were declined, whose parents can pay a reduced fee at time of service.  °[place]  [facility] [place]  [address], [place] (336) 641-7733 Accepts children up to age 21 who are enrolled in Medicaid or [place] Health Choice; pregnant women with a Medicaid card; and children who have applied for Medicaid or [place] Health Choice, but were declined, whose parents can pay a reduced fee at time of service.  °[facility] ° [address], [place] (336) 641-4533 Patients are seen by appointment only. Walk-ins are not accepted. [facility] will see patients 18 years of age and older. °Monday - Tuesday (8am-5pm) °Most Wednesdays (8:30-5pm) °$30 per visit, cash only  °[facility] ° [address], [place] (336) 641-4533 Patients are seen by appointment only. Walk-ins are not accepted. [facility] will see patients 18 years of age and older. °One   Wednesday Evening (Monthly: Volunteer Based).  $30 per visit, cash only  °[facility]  (919) 537-3737 for adults; Children under age 4, call Graduate Pediatric Dentistry at ([phone]) [phone]. Children aged 4-14, please call ([phone]) [phone] to request a pediatric application. ° Dental services are provided in all areas of dental care including fillings, crowns and bridges, complete and partial dentures, implants, gum treatment, root canals, and extractions. Preventive care is also provided. Treatment is provided to both adults and children. °Patients are selected via a lottery and there is often a waiting list. °  °[facility] [address], °[place] ° (336) 763-8833 [email or web address].drcivils.[email or web address] °  °[facility] [address], [place], [place] (336)723-1848, Ext. 123 Second and Fourth Thursday of each month, opens at 6:30 AM; Clinic ends at 9 AM.  Patients are seen on a first-come first-served basis, and a limited number are seen during each clinic.  ° °[facility] ° [address], [place], [place] (336) [phone]    Eligibility Requirements °You must have lived in [place], or [place] counties for at least the last three months. °  You cannot be eligible for state or federal sponsored healthcare insurance, including Veterans Administration, Medicaid, or Medicare. °  You generally cannot be eligible for healthcare insurance through your employer.  °  How to apply: °Eligibility screenings are held every Tuesday and Wednesday afternoon from 1:00 pm until 4:00 pm. You do not need an appointment for the interview!  °[facility] [address], [place], [place] 336-631-2330   °[facility]  336-342-8273   °[facility]  336-703-3100   °[facility]  336-570-6415   ° °Behavioral Health Resources in the Community: °Intensive Outpatient Programs °Organization         Address  Phone  Notes  °[facility] [address], [place], [place] 336-878-6098   °[facility] [address], [place], [place] 336-832-9800   °[facility] [address], [place], [place] ° 336-882-2125   °[facility] [address],  °[place], [place] 1-800-853-5163 or 336-641-4981   °Substance Abuse Resources °Organization         Address  Phone  Notes  °[facility]  336-882-2125   °[facility]  336-784-9470   °[facility]  336-285-9073   °[facility]  336-845-3988   °Residential & Outpatient Substance Abuse Program  1-800-659-3381   °Psychological Services °Organization         Address  Phone  Notes  °[facility]  336- 832-9600   °[facility]  336- 378-7881   °[facility] [address], [place] 1-800-853-5163 or 336-641-4981   ° °Mobile Crisis Teams °Organization         Address  Phone  Notes  °[facility], Mobile Crisis Care Unit  1-877-626-1772   °Assertive °[facility] ° [address]  [place], [place] 336-834-9664   °Sharon DeEsch [address] °[place] 336-554-5454   ° °Self-Help/Support Groups °Organization         Address  Phone             Notes  °[facility]  - variety of support groups  336- 373-1402 Call for more information  °Narcotics Anonymous (NA), [facility] [address], °[place]  2 meetings at this location  ° °  Residential Treatment Programs Organization         Address  Phone  Notes  [facility] [address]    [place]  1-320 801 1972   [facility]  [address] [place] [id number], [place], [place]   [place], [place] [phone] Admissions: 8am-3pm M-F  [facility] [place] [address]    [place], [place] [phone]   [facility] [address] [place], [place], [place]   [facility] [address]  [place], [place]   [facility] Intensive Outpatient [address], [place], [place]   [facility] ([place] [address]  [place], [place] 1-3868734191 or (806)260-8125   [facility] [address] [place], [place] Accepts Medicaid  [facility] [address],  [place] 1-(907)204-5733 Substance Abuse/Addiction Treatment   [facility] Organization         Address  Phone  Notes  [facility]  309-509-5138   Domenic Schwab, PhD [address] Arlis Porta [place], [place]   731-010-5813 or 986-521-4681   [facility], [place] 250-322-1283   [facility] [address], [place], [place] 225-744-8185 Insurance/Medicaid/sponsorship through [facility] [address]                                    [place], [place] (814)[phone] [place] [address], [place] [phone]    Dr. Adele Schilder  [phone]   [facility] Dept. 1) [address], [place] 2) [place] 3)  [address], [place] [phone] [phone])[phone]  [phone]   [place] 619-707-2392 or [phone] (After Hours)      Take over the counter decongestant (such as sudafed), as directed on packaging, for the next week.  Use over the counter normal saline nasal spray, as instructed in the Emergency Department, several times per day for the next 2 weeks.  Use your albuterol inhaler (2 to 4 puffs) every 4 hours for the next 7 days, then as needed for cough, wheezing, or shortness of breath.  Call your regular medical doctor tomorrow morning to schedule a follow up appointment within the next 3 days.  Return to the Emergency Department immediately sooner if worsening.

## 2014-02-06 NOTE — ED Notes (Signed)
Pt c/o n/v today with cough/congestion x 3 weeks.

## 2014-02-08 LAB — CULTURE, GROUP A STREP

## 2014-03-05 DIAGNOSIS — J069 Acute upper respiratory infection, unspecified: Secondary | ICD-10-CM | POA: Diagnosis not present

## 2014-03-05 DIAGNOSIS — E119 Type 2 diabetes mellitus without complications: Secondary | ICD-10-CM | POA: Diagnosis not present

## 2014-03-05 DIAGNOSIS — Z794 Long term (current) use of insulin: Secondary | ICD-10-CM | POA: Diagnosis not present

## 2014-04-09 DIAGNOSIS — E1165 Type 2 diabetes mellitus with hyperglycemia: Secondary | ICD-10-CM | POA: Diagnosis not present

## 2014-04-09 DIAGNOSIS — E1140 Type 2 diabetes mellitus with diabetic neuropathy, unspecified: Secondary | ICD-10-CM | POA: Diagnosis not present

## 2014-04-16 DIAGNOSIS — L609 Nail disorder, unspecified: Secondary | ICD-10-CM | POA: Diagnosis not present

## 2014-04-16 DIAGNOSIS — E1140 Type 2 diabetes mellitus with diabetic neuropathy, unspecified: Secondary | ICD-10-CM | POA: Diagnosis not present

## 2014-04-16 DIAGNOSIS — L110 Acquired keratosis follicularis: Secondary | ICD-10-CM | POA: Diagnosis not present

## 2014-04-29 DIAGNOSIS — Z9289 Personal history of other medical treatment: Secondary | ICD-10-CM

## 2014-04-29 HISTORY — DX: Personal history of other medical treatment: Z92.89

## 2014-05-03 ENCOUNTER — Other Ambulatory Visit (HOSPITAL_COMMUNITY): Payer: Self-pay

## 2014-05-03 ENCOUNTER — Emergency Department (HOSPITAL_COMMUNITY): Payer: Medicare Other

## 2014-05-03 ENCOUNTER — Encounter (HOSPITAL_COMMUNITY): Payer: Self-pay | Admitting: Emergency Medicine

## 2014-05-03 ENCOUNTER — Inpatient Hospital Stay (HOSPITAL_COMMUNITY)
Admission: EM | Admit: 2014-05-03 | Discharge: 2014-05-11 | DRG: 202 | Disposition: A | Discharge status: Home Health | Payer: Medicare Other | Attending: Internal Medicine | Admitting: Internal Medicine

## 2014-05-03 DIAGNOSIS — Z833 Family history of diabetes mellitus: Secondary | ICD-10-CM | POA: Diagnosis not present

## 2014-05-03 DIAGNOSIS — Z7952 Long term (current) use of systemic steroids: Secondary | ICD-10-CM | POA: Diagnosis not present

## 2014-05-03 DIAGNOSIS — N179 Acute kidney failure, unspecified: Secondary | ICD-10-CM | POA: Diagnosis not present

## 2014-05-03 DIAGNOSIS — E875 Hyperkalemia: Secondary | ICD-10-CM | POA: Diagnosis present

## 2014-05-03 DIAGNOSIS — I10 Essential (primary) hypertension: Secondary | ICD-10-CM | POA: Diagnosis not present

## 2014-05-03 DIAGNOSIS — E1169 Type 2 diabetes mellitus with other specified complication: Secondary | ICD-10-CM

## 2014-05-03 DIAGNOSIS — Z791 Long term (current) use of non-steroidal anti-inflammatories (NSAID): Secondary | ICD-10-CM

## 2014-05-03 DIAGNOSIS — J9601 Acute respiratory failure with hypoxia: Secondary | ICD-10-CM | POA: Diagnosis present

## 2014-05-03 DIAGNOSIS — J45909 Unspecified asthma, uncomplicated: Secondary | ICD-10-CM | POA: Diagnosis not present

## 2014-05-03 DIAGNOSIS — E785 Hyperlipidemia, unspecified: Secondary | ICD-10-CM | POA: Diagnosis present

## 2014-05-03 DIAGNOSIS — Z23 Encounter for immunization: Secondary | ICD-10-CM | POA: Diagnosis not present

## 2014-05-03 DIAGNOSIS — E662 Morbid (severe) obesity with alveolar hypoventilation: Secondary | ICD-10-CM | POA: Diagnosis present

## 2014-05-03 DIAGNOSIS — I129 Hypertensive chronic kidney disease with stage 1 through stage 4 chronic kidney disease, or unspecified chronic kidney disease: Secondary | ICD-10-CM | POA: Diagnosis present

## 2014-05-03 DIAGNOSIS — N183 Chronic kidney disease, stage 3 unspecified: Secondary | ICD-10-CM

## 2014-05-03 DIAGNOSIS — Z794 Long term (current) use of insulin: Secondary | ICD-10-CM | POA: Diagnosis not present

## 2014-05-03 DIAGNOSIS — J4550 Severe persistent asthma, uncomplicated: Secondary | ICD-10-CM

## 2014-05-03 DIAGNOSIS — E1165 Type 2 diabetes mellitus with hyperglycemia: Secondary | ICD-10-CM | POA: Diagnosis not present

## 2014-05-03 DIAGNOSIS — Z6841 Body Mass Index (BMI) 40.0 and over, adult: Secondary | ICD-10-CM

## 2014-05-03 DIAGNOSIS — E119 Type 2 diabetes mellitus without complications: Secondary | ICD-10-CM | POA: Diagnosis not present

## 2014-05-03 DIAGNOSIS — J45901 Unspecified asthma with (acute) exacerbation: Secondary | ICD-10-CM | POA: Diagnosis not present

## 2014-05-03 DIAGNOSIS — Z7982 Long term (current) use of aspirin: Secondary | ICD-10-CM | POA: Diagnosis not present

## 2014-05-03 DIAGNOSIS — T380X5A Adverse effect of glucocorticoids and synthetic analogues, initial encounter: Secondary | ICD-10-CM | POA: Diagnosis present

## 2014-05-03 DIAGNOSIS — E669 Obesity, unspecified: Secondary | ICD-10-CM

## 2014-05-03 DIAGNOSIS — R062 Wheezing: Secondary | ICD-10-CM

## 2014-05-03 DIAGNOSIS — R0600 Dyspnea, unspecified: Secondary | ICD-10-CM | POA: Diagnosis not present

## 2014-05-03 HISTORY — DX: Type 2 diabetes mellitus with other specified complication: E11.69

## 2014-05-03 HISTORY — DX: Acute kidney failure, unspecified: N17.9

## 2014-05-03 HISTORY — DX: Type 2 diabetes mellitus with other specified complication: E66.9

## 2014-05-03 HISTORY — DX: Severe persistent asthma, uncomplicated: J45.50

## 2014-05-03 HISTORY — DX: Unspecified asthma with (acute) exacerbation: J45.901

## 2014-05-03 LAB — CBC WITH DIFFERENTIAL/PLATELET
BASOS PCT: 0 % (ref 0–1)
Basophils Absolute: 0 10*3/uL (ref 0.0–0.1)
EOS ABS: 0.8 10*3/uL — AB (ref 0.0–0.7)
Eosinophils Relative: 7 % — ABNORMAL HIGH (ref 0–5)
HCT: 37 % (ref 36.0–46.0)
Hemoglobin: 11.2 g/dL — ABNORMAL LOW (ref 12.0–15.0)
LYMPHS ABS: 2.3 10*3/uL (ref 0.7–4.0)
LYMPHS PCT: 22 % (ref 12–46)
MCH: 26.2 pg (ref 26.0–34.0)
MCHC: 30.3 g/dL (ref 30.0–36.0)
MCV: 86.4 fL (ref 78.0–100.0)
Monocytes Absolute: 1 10*3/uL (ref 0.1–1.0)
Monocytes Relative: 9 % (ref 3–12)
Neutro Abs: 6.6 10*3/uL (ref 1.7–7.7)
Neutrophils Relative %: 62 % (ref 43–77)
PLATELETS: 187 10*3/uL (ref 150–400)
RBC: 4.28 MIL/uL (ref 3.87–5.11)
RDW: 15 % (ref 11.5–15.5)
WBC: 10.7 10*3/uL — ABNORMAL HIGH (ref 4.0–10.5)

## 2014-05-03 LAB — BASIC METABOLIC PANEL
Anion gap: 3 — ABNORMAL LOW (ref 5–15)
BUN: 41 mg/dL — ABNORMAL HIGH (ref 6–23)
CO2: 29 mmol/L (ref 19–32)
Calcium: 8.7 mg/dL (ref 8.4–10.5)
Chloride: 107 mmol/L (ref 96–112)
Creatinine, Ser: 1.79 mg/dL — ABNORMAL HIGH (ref 0.50–1.10)
GFR calc Af Amer: 38 mL/min — ABNORMAL LOW (ref >90)
GFR calc non Af Amer: 33 mL/min — ABNORMAL LOW (ref >90)
GLUCOSE: 140 mg/dL — AB (ref 70–99)
POTASSIUM: 5 mmol/L (ref 3.5–5.1)
Sodium: 139 mmol/L (ref 135–145)

## 2014-05-03 LAB — GLUCOSE, CAPILLARY
GLUCOSE-CAPILLARY: 82 mg/dL (ref 70–99)
Glucose-Capillary: 141 mg/dL — ABNORMAL HIGH (ref 70–99)

## 2014-05-03 LAB — BRAIN NATRIURETIC PEPTIDE: B NATRIURETIC PEPTIDE 5: 122 pg/mL — AB (ref 0.0–100.0)

## 2014-05-03 MED ORDER — IPRATROPIUM BROMIDE 0.02 % IN SOLN: 0.5000 mg | Freq: Four times a day (QID) | RESPIRATORY_TRACT | Status: DC

## 2014-05-03 MED ORDER — METHYLPREDNISOLONE SODIUM SUCC 125 MG IJ SOLR
60.0000 mg | Freq: Two times a day (BID) | INTRAMUSCULAR | Status: DC
  Administered 2014-05-03 – 2014-05-04 (×3): 60 mg via INTRAVENOUS
  Filled 2014-05-03 (×3): qty 2

## 2014-05-03 MED ORDER — NITROGLYCERIN 0.4 MG SL SUBL: 0.4000 mg | SUBLINGUAL_TABLET | SUBLINGUAL | Status: DC | PRN

## 2014-05-03 MED ORDER — ONDANSETRON HCL 4 MG PO TABS: 4.0000 mg | ORAL_TABLET | Freq: Four times a day (QID) | ORAL | Status: DC | PRN

## 2014-05-03 MED ORDER — GABAPENTIN 300 MG PO CAPS
300.0000 mg | ORAL_CAPSULE | Freq: Three times a day (TID) | ORAL | Status: DC
  Administered 2014-05-03 – 2014-05-11 (×24): 300 mg via ORAL
  Filled 2014-05-03 (×24): qty 1

## 2014-05-03 MED ORDER — ALOGLIPTIN-PIOGLITAZONE 25-15 MG PO TABS: 1.0000 | ORAL_TABLET | Freq: Every day | ORAL | Status: DC

## 2014-05-03 MED ORDER — DOCUSATE SODIUM 100 MG PO CAPS
100.0000 mg | ORAL_CAPSULE | Freq: Every day | ORAL | Status: DC | PRN
  Administered 2014-05-10: 100 mg via ORAL
  Filled 2014-05-03: qty 1

## 2014-05-03 MED ORDER — ACETAMINOPHEN 650 MG RE SUPP: 650.0000 mg | Freq: Four times a day (QID) | RECTAL | Status: DC | PRN

## 2014-05-03 MED ORDER — IRBESARTAN 300 MG PO TABS
300.0000 mg | ORAL_TABLET | Freq: Every day | ORAL | Status: DC
  Administered 2014-05-04: 300 mg via ORAL
  Filled 2014-05-03: qty 1

## 2014-05-03 MED ORDER — MOMETASONE FURO-FORMOTEROL FUM 100-5 MCG/ACT IN AERO
2.0000 | INHALATION_SPRAY | Freq: Two times a day (BID) | RESPIRATORY_TRACT | Status: DC
  Administered 2014-05-04 – 2014-05-11 (×15): 2 via RESPIRATORY_TRACT
  Filled 2014-05-03: qty 8.8

## 2014-05-03 MED ORDER — ATORVASTATIN CALCIUM 20 MG PO TABS
20.0000 mg | ORAL_TABLET | Freq: Every day | ORAL | Status: DC
  Administered 2014-05-04 – 2014-05-10 (×7): 20 mg via ORAL
  Filled 2014-05-03 (×8): qty 1

## 2014-05-03 MED ORDER — ALUM & MAG HYDROXIDE-SIMETH 200-200-20 MG/5ML PO SUSP: 30.0000 mL | Freq: Four times a day (QID) | ORAL | Status: DC | PRN

## 2014-05-03 MED ORDER — NAPHAZOLINE HCL 0.1 % OP SOLN
1.0000 [drp] | Freq: Four times a day (QID) | OPHTHALMIC | Status: DC | PRN
  Filled 2014-05-03: qty 15

## 2014-05-03 MED ORDER — PNEUMOCOCCAL VAC POLYVALENT 25 MCG/0.5ML IJ INJ
0.5000 mL | INJECTION | INTRAMUSCULAR | Status: AC
  Administered 2014-05-04: 0.5 mL via INTRAMUSCULAR
  Filled 2014-05-03: qty 0.5

## 2014-05-03 MED ORDER — ONDANSETRON HCL 4 MG/2ML IJ SOLN
4.0000 mg | Freq: Four times a day (QID) | INTRAMUSCULAR | Status: DC | PRN
  Filled 2014-05-03: qty 2

## 2014-05-03 MED ORDER — PREDNISONE 10 MG PO TABS: 20.0000 mg | ORAL_TABLET | Freq: Two times a day (BID) | ORAL | Status: DC

## 2014-05-03 MED ORDER — INSULIN ASPART 100 UNIT/ML ~~LOC~~ SOLN
0.0000 [IU] | Freq: Three times a day (TID) | SUBCUTANEOUS | Status: DC
  Administered 2014-05-04: 4 [IU] via SUBCUTANEOUS
  Administered 2014-05-04 – 2014-05-05 (×5): 7 [IU] via SUBCUTANEOUS
  Administered 2014-05-06: 4 [IU] via SUBCUTANEOUS
  Administered 2014-05-06 (×2): 7 [IU] via SUBCUTANEOUS
  Administered 2014-05-07 (×3): 4 [IU] via SUBCUTANEOUS
  Administered 2014-05-08: 3 [IU] via SUBCUTANEOUS
  Administered 2014-05-08 – 2014-05-09 (×2): 4 [IU] via SUBCUTANEOUS
  Administered 2014-05-11: 3 [IU] via SUBCUTANEOUS
  Administered 2014-05-11: 4 [IU] via SUBCUTANEOUS

## 2014-05-03 MED ORDER — INSULIN ISOPHANE & REGULAR (HUMAN 70-30)100 UNIT/ML KWIKPEN: 48.0000 [IU] | PEN_INJECTOR | Freq: Two times a day (BID) | SUBCUTANEOUS | Status: DC

## 2014-05-03 MED ORDER — ASPIRIN EC 81 MG PO TBEC
81.0000 mg | DELAYED_RELEASE_TABLET | Freq: Every day | ORAL | Status: DC
  Administered 2014-05-04 – 2014-05-11 (×8): 81 mg via ORAL
  Filled 2014-05-03 (×8): qty 1

## 2014-05-03 MED ORDER — MAGNESIUM SULFATE 2 GM/50ML IV SOLN
2.0000 g | Freq: Once | INTRAVENOUS | Status: AC
  Administered 2014-05-03: 2 g via INTRAVENOUS
  Filled 2014-05-03: qty 50

## 2014-05-03 MED ORDER — METHYLPREDNISOLONE SODIUM SUCC 125 MG IJ SOLR
125.0000 mg | Freq: Once | INTRAMUSCULAR | Status: AC
  Administered 2014-05-03: 125 mg via INTRAVENOUS
  Filled 2014-05-03: qty 2

## 2014-05-03 MED ORDER — POTASSIUM CHLORIDE CRYS ER 10 MEQ PO TBCR: 10.0000 meq | EXTENDED_RELEASE_TABLET | Freq: Two times a day (BID) | ORAL | Status: DC

## 2014-05-03 MED ORDER — VALSARTAN-HYDROCHLOROTHIAZIDE 320-25 MG PO TABS: 1.0000 | ORAL_TABLET | Freq: Every day | ORAL | Status: DC

## 2014-05-03 MED ORDER — HYDROCHLOROTHIAZIDE 25 MG PO TABS
25.0000 mg | ORAL_TABLET | Freq: Every day | ORAL | Status: DC
  Administered 2014-05-04: 25 mg via ORAL
  Filled 2014-05-03: qty 1

## 2014-05-03 MED ORDER — IPRATROPIUM-ALBUTEROL 0.5-2.5 (3) MG/3ML IN SOLN
3.0000 mL | Freq: Four times a day (QID) | RESPIRATORY_TRACT | Status: DC
Start: 2014-05-03 — End: 2014-05-06
  Administered 2014-05-03 – 2014-05-06 (×13): 3 mL via RESPIRATORY_TRACT
  Filled 2014-05-03 (×14): qty 3

## 2014-05-03 MED ORDER — METFORMIN HCL 500 MG PO TABS: 1000.0000 mg | ORAL_TABLET | Freq: Two times a day (BID) | ORAL | Status: DC

## 2014-05-03 MED ORDER — ALBUTEROL (5 MG/ML) CONTINUOUS INHALATION SOLN
5.0000 mg/h | INHALATION_SOLUTION | Freq: Once | RESPIRATORY_TRACT | Status: AC
  Administered 2014-05-03: 5 mg/h via RESPIRATORY_TRACT
  Filled 2014-05-03: qty 20

## 2014-05-03 MED ORDER — GUAIFENESIN-DM 100-10 MG/5ML PO SYRP
5.0000 mL | ORAL_SOLUTION | ORAL | Status: DC | PRN
  Administered 2014-05-04 – 2014-05-11 (×12): 5 mL via ORAL
  Filled 2014-05-03 (×12): qty 5

## 2014-05-03 MED ORDER — INSULIN ASPART 100 UNIT/ML ~~LOC~~ SOLN
0.0000 [IU] | Freq: Every day | SUBCUTANEOUS | Status: DC
Start: 2014-05-03 — End: 2014-05-11
  Administered 2014-05-05 – 2014-05-07 (×2): 2 [IU] via SUBCUTANEOUS

## 2014-05-03 MED ORDER — MONTELUKAST SODIUM 10 MG PO TABS
10.0000 mg | ORAL_TABLET | Freq: Every day | ORAL | Status: DC
  Administered 2014-05-04 – 2014-05-11 (×8): 10 mg via ORAL
  Filled 2014-05-03 (×8): qty 1

## 2014-05-03 MED ORDER — ALBUTEROL SULFATE (2.5 MG/3ML) 0.083% IN NEBU: 2.5000 mg | INHALATION_SOLUTION | RESPIRATORY_TRACT | Status: DC | PRN

## 2014-05-03 MED ORDER — ACETAMINOPHEN 325 MG PO TABS
650.0000 mg | ORAL_TABLET | Freq: Four times a day (QID) | ORAL | Status: DC | PRN
  Administered 2014-05-03 – 2014-05-06 (×7): 650 mg via ORAL
  Filled 2014-05-03 (×7): qty 2

## 2014-05-03 MED ORDER — CARVEDILOL 3.125 MG PO TABS
6.2500 mg | ORAL_TABLET | Freq: Two times a day (BID) | ORAL | Status: DC
  Administered 2014-05-03 – 2014-05-06 (×7): 6.25 mg via ORAL
  Filled 2014-05-03 (×7): qty 2

## 2014-05-03 MED ORDER — ALBUTEROL SULFATE (2.5 MG/3ML) 0.083% IN NEBU: 2.5000 mg | INHALATION_SOLUTION | Freq: Once | RESPIRATORY_TRACT | Status: DC

## 2014-05-03 MED ORDER — IBUPROFEN 800 MG PO TABS
800.0000 mg | ORAL_TABLET | Freq: Three times a day (TID) | ORAL | Status: DC | PRN
  Administered 2014-05-08: 800 mg via ORAL
  Filled 2014-05-03 (×2): qty 1

## 2014-05-03 MED ORDER — BENZONATATE 100 MG PO CAPS: 100.0000 mg | ORAL_CAPSULE | Freq: Three times a day (TID) | ORAL | Status: DC | PRN

## 2014-05-03 MED ORDER — INSULIN ASPART PROT & ASPART (70-30 MIX) 100 UNIT/ML ~~LOC~~ SUSP
48.0000 [IU] | Freq: Two times a day (BID) | SUBCUTANEOUS | Status: DC
Start: 2014-05-04 — End: 2014-05-04
  Administered 2014-05-04: 48 [IU] via SUBCUTANEOUS
  Filled 2014-05-03: qty 10

## 2014-05-03 MED ORDER — ENOXAPARIN SODIUM 80 MG/0.8ML ~~LOC~~ SOLN
80.0000 mg | SUBCUTANEOUS | Status: DC
  Administered 2014-05-03 – 2014-05-10 (×8): 80 mg via SUBCUTANEOUS
  Filled 2014-05-03 (×8): qty 0.8

## 2014-05-03 MED ORDER — FUROSEMIDE 10 MG/ML IJ SOLN
40.0000 mg | Freq: Once | INTRAMUSCULAR | Status: AC
  Administered 2014-05-03: 40 mg via INTRAVENOUS
  Filled 2014-05-03: qty 4

## 2014-05-03 MED ORDER — IPRATROPIUM-ALBUTEROL 0.5-2.5 (3) MG/3ML IN SOLN
3.0000 mL | Freq: Once | RESPIRATORY_TRACT | Status: AC
  Administered 2014-05-03: 3 mL via RESPIRATORY_TRACT
  Filled 2014-05-03: qty 3

## 2014-05-03 MED ORDER — ALBUTEROL SULFATE (2.5 MG/3ML) 0.083% IN NEBU
2.5000 mg | INHALATION_SOLUTION | Freq: Four times a day (QID) | RESPIRATORY_TRACT | Status: DC
  Filled 2014-05-03: qty 3

## 2014-05-03 NOTE — ED Notes (Signed)
Dr. Stark Jock in assessing pt.

## 2014-05-03 NOTE — H&P (Signed)
History and Physical  [name] [id number] DOB: 03-05-1965 DOA: 05/03/2014  Referring physician: Dr Stark Jock, ED physician PCP: [name], MD   Chief Complaint: Shortness of breath  HPI: [name] is a 49 y.o. female  With a history of diabetes mellitus, hypertension, asthma, hyperlipidemia who presents to the hospital with 1 week of worsening shortness of breath and dyspnea on exertion. Shortness of breath worse with ambulation, activity, talking. The symptoms are improved with steroids that she received emergency department and overlies her treatments. The patient was initially arranged to go home, but on ambulation prior to being discharged the patient's oxygen saturation dropped to high 70s. Even when talking, her oxygen saturation drops to the mid 80s.   Review of Systems:   Pt complains of dry cough, wheezing, peripheral edema which is chronic  Pt denies any chest pain, palpitations, fevers, chills, nausea, vomiting, lightheadedness, dizziness, weakness, abdominal pain, diarrhea, constipation, leg pain.  Review of systems are otherwise negative  Past Medical History  Diagnosis Date  . Asthma   . Hypertension   . Diabetes mellitus   . Hyperlipidemia    Past Surgical History  Procedure Laterality Date  . Tracheostomy      at age 12 from asthma attack  . Cataract extraction w/phaco Right 12/10/2012    Procedure: CATARACT EXTRACTION PHACO AND INTRAOCULAR LENS PLACEMENT (IOC);  Surgeon: Tonny Branch, MD;  Location: [facility];  Service: Ophthalmology;  Laterality: Right;  CDE:22..42  . Left heart catheterization with coronary angiogram N/A 04/09/2013    Procedure: LEFT HEART CATHETERIZATION WITH CORONARY ANGIOGRAM;  Surgeon: Laverda Page, MD;  Location: [facility] CATH LAB;  Service: Cardiovascular;  Laterality: N/A;  . Cardiac catheterization  03/2013    normal coronary arteries, EF 55%  . Eye surgery Left    Social History:  reports that she has never smoked.  She has never used smokeless tobacco. She reports that she drinks alcohol. She reports that she does not use illicit drugs. Patient lives at home & is able to participate in activities of daily living  No Known Allergies  Family History  Problem Relation Age of Onset  . Diabetes Mother       Prior to Admission medications   Medication Sig Start Date End Date Taking? Authorizing Provider  albuterol (PROVENTIL HFA;VENTOLIN HFA) 108 (90 BASE) MCG/ACT inhaler Inhale 2 puffs into the lungs every 6 (six) hours as needed for wheezing.   Yes Historical Provider, MD  albuterol (PROVENTIL) (2.5 MG/3ML) 0.083% nebulizer solution Take 2.5 mg by nebulization every 4 (four) hours as needed for wheezing or shortness of breath.   Yes Historical Provider, MD  Alogliptin-Pioglitazone (OSENI) 25-15 MG TABS Take 1 tablet by mouth daily.   Yes Historical Provider, MD  aspirin EC 81 MG tablet Take 81 mg by mouth daily.    Yes Historical Provider, MD  atorvastatin (LIPITOR) 20 MG tablet Take 20 mg by mouth at bedtime.    Yes Historical Provider, MD  benzonatate (TESSALON) 100 MG capsule Take 1 capsule (100 mg total) by mouth 3 (three) times daily as needed for cough. 02/06/14  Yes Francine Graven, DO  carvedilol (COREG) 6.25 MG tablet Take 1 tablet by mouth 2 (two) times daily. 11/08/13  Yes Historical Provider, MD  Fluticasone-Salmeterol (ADVAIR) 100-50 MCG/DOSE AEPB Inhale 1 puff into the lungs every 12 (twelve) hours as needed (for shortness of breath).    Yes Historical Provider, MD  furosemide (LASIX) 40 MG tablet Take 40 mg  by mouth 2 (two) times daily.    Yes Historical Provider, MD  gabapentin (NEURONTIN) 300 MG capsule Take 300 mg by mouth 3 (three) times daily.    Yes Historical Provider, MD  ibuprofen (ADVIL,MOTRIN) 800 MG tablet Take 800 mg by mouth every 8 (eight) hours as needed for moderate pain. Pain   Yes Historical Provider, MD  Insulin Isophane & Regular Human (HUMULIN 70/30 KWIKPEN) (70-30) 100  UNIT/ML PEN Inject 48 Units into the skin 2 (two) times daily.   Yes Historical Provider, MD  metFORMIN (GLUCOPHAGE) 1000 MG tablet Take 1 tablet (1,000 mg total) by mouth 2 (two) times daily with a meal. 04/10/13  Yes Laverda Page, MD  montelukast (SINGULAIR) 10 MG tablet Take 10 mg by mouth daily.    Yes Historical Provider, MD  nitroGLYCERIN (NITROSTAT) 0.4 MG SL tablet Place 0.4 mg under the tongue every 5 (five) minutes as needed for chest pain.   Yes Historical Provider, MD  potassium chloride (K-DUR,KLOR-CON) 10 MEQ tablet Take 10 mEq by mouth 2 (two) times daily.   Yes Historical Provider, MD  tetrahydrozoline 0.05 % ophthalmic solution Place 1 drop into both eyes daily as needed. Dry, Itchy Eyes   Yes Historical Provider, MD  valsartan-hydrochlorothiazide (DIOVAN-HCT) 320-25 MG per tablet Take 1 tablet by mouth daily.   Yes Historical Provider, MD  nitrofurantoin, macrocrystal-monohydrate, (MACROBID) 100 MG capsule Take 1 capsule (100 mg total) by mouth 2 (two) times daily. X 7 days Patient not taking: Reported on 02/06/2014 11/18/13   Nat Christen, MD  predniSONE (DELTASONE) 10 MG tablet Take 2 tablets (20 mg total) by mouth 2 (two) times daily. 05/03/14   [name], MD    Physical Exam: BP 128/90 mmHg  Pulse 67  Temp(Src) 97.7 F (36.5 C) (Oral)  Resp 28  Ht 5' 0.5" (1.537 m)  Wt 172.82 kg (381 lb)  BMI 73.16 kg/m2  SpO2 92%  General: 30 black female. Awake and alert and oriented x3. No acute cardiopulmonary distress.  Eyes: Pupils equal, round, reactive to light. Extraocular muscles are intact. Sclerae anicteric and noninjected.  ENT: External auditory canals are patent and tympanic membranes reflect a good cone of light. Moist mucosal membranes. No mucosal lesions.  Neck: Neck supple without lymphadenopathy. No carotid bruits. No masses palpated.  Cardiovascular: Regular rate with normal S1-S2 sounds. No murmurs, rubs, gallops auscultated. No JVD.  Respiratory:  Extremely diminished breath sounds due to body habitus. No rales or rhonchi auscultated. Faint wheezes.  Abdomen: Morbidly obese. Soft, nontender, nondistended. Active bowel sounds. No masses or hepatosplenomegaly  Skin: Dry, warm to touch. 2+ dorsalis pedis and radial pulses. There is 2-3+ pitting edema in the lower external nares bilaterally Musculoskeletal: No calf or leg pain. All major joints not erythematous nontender.  Psychiatric: Intact judgment and insight.  Neurologic: No focal neurological deficits. Cranial nerves II through XII are grossly intact.           Labs on Admission:  Basic Metabolic Panel:  Recent Labs Lab 05/03/14 1406  NA 139  K 5.0  CL 107  CO2 29  GLUCOSE 140*  BUN 41*  CREATININE 1.79*  CALCIUM 8.7   Liver Function Tests: No results for input(s): AST, ALT, ALKPHOS, BILITOT, PROT, ALBUMIN in the last 168 hours. No results for input(s): LIPASE, AMYLASE in the last 168 hours. No results for input(s): AMMONIA in the last 168 hours. CBC:  Recent Labs Lab 05/03/14 1406  WBC 10.7*  NEUTROABS 6.6  HGB  11.2*  HCT 37.0  MCV 86.4  PLT 187   Cardiac Enzymes: No results for input(s): CKTOTAL, CKMB, CKMBINDEX, TROPONINI in the last 168 hours.  BNP (last 3 results)  Recent Labs  05/03/14 1406  BNP 122.0*    ProBNP (last 3 results) No results for input(s): PROBNP in the last 8760 hours.  CBG: No results for input(s): GLUCAP in the last 168 hours.  Radiological Exams on Admission: Dg Chest Port 1 View  05/03/2014   CLINICAL DATA:  Short of breath. Wheezing since last week. Asthma. Diabetes.  EXAM: PORTABLE CHEST - 1 VIEW  COMPARISON:  02/06/2014  FINDINGS: Moderately degraded exam secondary to patient body habitus and AP portable technique. Midline trachea. Cardiomegaly accentuated by AP portable technique. No gross pleural fluid. No pneumothorax. Pulmonary interstitial prominence is at least partially due to technique and low lung volumes. No  lobar consolidation.  IMPRESSION: Moderately degraded exam. Consider PA and lateral radiographs if possible.  Cardiomegaly and low lung volumes. Pulmonary interstitial prominence which is at least partially due to low volumes and technique. Cannot exclude mild pulmonary venous congestion.   Electronically Signed   By: Abigail Miyamoto M.D.   On: 05/03/2014 16:03    EKG: Independently reviewed. Sinus rhythm. Normal intervals. Slight right axis deviation. No ST changes.   Assessment/Plan Present on Admission:  . Asthma exacerbation . Acute renal injury . Acute respiratory failure with hypoxia  #1 acute respiratory failure with hypoxia #2 asthma exacerbation #3 acute renal injury #4 diabetes type 2 #5 hypertension #6 hyperlipidemia  Admit for observation - I expect the patient will rapidly improve and may be eligible for discharge tomorrow. Oxygen by nasal cannula to maintain oxygen saturation as moderate conversation even decreases her oxygen saturation. Solu-Medrol 60 mg twice a day We will hold Lasix for the night and tomorrow as she has acute renal insufficiency. Sliding scale insulin to cover her elevated blood sugars due to the steroids. CBGs before meals and daily at bedtime  DVT prophylaxis: Lovenox  Consultants: None  Code Status: Full code  Family Communication: None   Disposition Plan: Home following improvement of respiratory status  Time spent: 70 minutes was spent with face-to-face time with patient with at least 50% with counseling and coordination of care  Truett Mainland, DO [facility] Pager [phone]

## 2014-05-03 NOTE — ED Notes (Signed)
Patient c/o wheezing with shortness of breath. Per patient has asthma. Patient reports using inhaler and neb treatments 2 days ago with no relief. Denies any fevers or chest pain. Per patient nonproductive cough.

## 2014-05-03 NOTE — ED Notes (Signed)
EDP aware of pt's 02 sat and VS.  02 sat between 88-92% on room air.

## 2014-05-03 NOTE — ED Provider Notes (Addendum)
CSN: [id number]     Arrival date & time 05/03/14  1322 History  This chart was scribed for Lori Speak, MD by [name], ED Scribe. This patient was seen in room APA05/APA05 and the patient's care was started at 1:50 PM.   Chief Complaint  Patient presents with  . Wheezing   Patient is a 49 y.o. female presenting with wheezing. The history is provided by the patient. No language interpreter was used.  Wheezing   HPI Comments: [name] is a 49 y.o. female with a PMHx of asthma, hypertension, diabetes hyperlipidemia, tracheostomy, left heart catheterization with coronary angiogram and cataract extraction, who presents to the Emergency Department complaining of shortness of breath and wheezing that initially began last week and worsened last night. She states that she used an inhaler and nebulizer treatment with mild relief. She also reports "tightness" to legs. She states that she takes 40 mg Lasix daily, but states that she has not taken any today because she was coming to the ED. She reports associated non productive cough. She denies associated fever. She denies hx of CHF. She states that the last time she was seen in the ED for breathing trouble was a couple months ago.  Past Medical History  Diagnosis Date  . Asthma   . Hypertension   . Diabetes mellitus   . Hyperlipidemia    Past Surgical History  Procedure Laterality Date  . Tracheostomy      at age 15 from asthma attack  . Cataract extraction w/phaco Right 12/10/2012    Procedure: CATARACT EXTRACTION PHACO AND INTRAOCULAR LENS PLACEMENT (IOC);  Surgeon: Tonny Branch, MD;  Location: [facility];  Service: Ophthalmology;  Laterality: Right;  CDE:22..42  . Left heart catheterization with coronary angiogram N/A 04/09/2013    Procedure: LEFT HEART CATHETERIZATION WITH CORONARY ANGIOGRAM;  Surgeon: Laverda Page, MD;  Location: [facility] CATH LAB;  Service: Cardiovascular;  Laterality: N/A;  . Cardiac catheterization  03/2013     normal coronary arteries, EF 55%  . Eye surgery Left    Family History  Problem Relation Age of Onset  . Diabetes Mother    History  Substance Use Topics  . Smoking status: Never Smoker   . Smokeless tobacco: Never Used  . Alcohol Use: Yes     Comment: occasional   OB History    Gravida Para Term Preterm AB TAB SAB Ectopic Multiple Living            0     Review of Systems  Respiratory: Positive for wheezing.    A complete 10 system review of systems was obtained and all systems are negative except as noted in the HPI and PMH.   Allergies  Review of patient's allergies indicates no known allergies.  Home Medications   Prior to Admission medications   Medication Sig Start Date End Date Taking? Authorizing Provider  albuterol (PROVENTIL HFA;VENTOLIN HFA) 108 (90 BASE) MCG/ACT inhaler Inhale 2 puffs into the lungs every 6 (six) hours as needed for wheezing.    Historical Provider, MD  albuterol (PROVENTIL) (2.5 MG/3ML) 0.083% nebulizer solution Take 2.5 mg by nebulization every 4 (four) hours as needed for wheezing or shortness of breath.    Historical Provider, MD  Alogliptin-Pioglitazone (OSENI) 25-15 MG TABS Take 1 tablet by mouth daily.    Historical Provider, MD  aspirin EC 81 MG tablet Take 81 mg by mouth daily.     Historical Provider, MD  atorvastatin (LIPITOR) 20 MG tablet  Take 20 mg by mouth at bedtime.     Historical Provider, MD  benzonatate (TESSALON) 100 MG capsule Take 1 capsule (100 mg total) by mouth 3 (three) times daily as needed for cough. 02/06/14   Francine Graven, DO  carvedilol (COREG) 6.25 MG tablet Take 1 tablet by mouth 2 (two) times daily. 11/08/13   Historical Provider, MD  Fluticasone-Salmeterol (ADVAIR) 100-50 MCG/DOSE AEPB Inhale 1 puff into the lungs every 12 (twelve) hours as needed (for shortness of breath).     Historical Provider, MD  furosemide (LASIX) 40 MG tablet Take 40 mg by mouth 2 (two) times daily.     Historical Provider, MD   gabapentin (NEURONTIN) 300 MG capsule Take 300 mg by mouth 3 (three) times daily.     Historical Provider, MD  ibuprofen (ADVIL,MOTRIN) 800 MG tablet Take 800 mg by mouth every 8 (eight) hours as needed for moderate pain. Pain    Historical Provider, MD  Insulin Isophane & Regular Human (HUMULIN 70/30 KWIKPEN) (70-30) 100 UNIT/ML PEN Inject 48 Units into the skin 2 (two) times daily.    Historical Provider, MD  metFORMIN (GLUCOPHAGE) 1000 MG tablet Take 1 tablet (1,000 mg total) by mouth 2 (two) times daily with a meal. 04/10/13   Laverda Page, MD  montelukast (SINGULAIR) 10 MG tablet Take 10 mg by mouth daily.     Historical Provider, MD  nitrofurantoin, macrocrystal-monohydrate, (MACROBID) 100 MG capsule Take 1 capsule (100 mg total) by mouth 2 (two) times daily. X 7 days Patient not taking: Reported on 02/06/2014 11/18/13   Nat Christen, MD  nitroGLYCERIN (NITROSTAT) 0.4 MG SL tablet Place 0.4 mg under the tongue every 5 (five) minutes as needed for chest pain.    Historical Provider, MD  potassium chloride (K-DUR,KLOR-CON) 10 MEQ tablet Take 10 mEq by mouth 2 (two) times daily.    Historical Provider, MD  tetrahydrozoline 0.05 % ophthalmic solution Place 1 drop into both eyes daily as needed. Dry, Itchy Eyes    Historical Provider, MD  valsartan-hydrochlorothiazide (DIOVAN-HCT) 320-25 MG per tablet Take 1 tablet by mouth daily.    Historical Provider, MD   Triage Vitals: BP 144/85 mmHg  Pulse 74  Resp 34  Ht 5' 0.5" (1.537 m)  Wt 381 lb (172.82 kg)  BMI 73.16 kg/m2  SpO2 98%  Physical Exam  Constitutional: She is oriented to person, place, and time. She appears well-developed and well-nourished. No distress.  Pt is a morbidly obese female in no acute distress.  HENT:  Head: Normocephalic and atraumatic.  Mouth/Throat: Oropharynx is clear and moist.  Eyes: EOM are normal. Pupils are equal, round, and reactive to light.  Neck: Normal range of motion. Neck supple.  Cardiovascular:  Normal rate and regular rhythm.  Exam reveals no friction rub.   No murmur heard. Pulmonary/Chest: Effort normal. No respiratory distress. She has wheezes. She has no rales.  Expiratory wheezes bilaterally.  Abdominal: Soft. She exhibits no distension. There is no tenderness. There is no rebound.  Musculoskeletal: Normal range of motion. She exhibits no edema.  Lower extremities exhibit changes consistent with long standing lymphedema.  Neurological: She is alert and oriented to person, place, and time.  Skin: Skin is warm and dry. She is not diaphoretic.  Nursing note and vitals reviewed.  ED Course  Procedures (including critical care time)  DIAGNOSTIC STUDIES: Oxygen Saturation is 98% on [place], normal by my interpretation.    COORDINATION OF CARE: 1:53 PM- Discussed plans to order diagnostic  CXR, lab work and EKG. Will give patient nebulizer breathing treatment, Solumedrol, and Lasix. Pt advised of plan for treatment and pt agrees.  Labs Review Labs Reviewed - No data to display  Imaging Review No results found.   EKG Interpretation None     MDM   Final diagnoses:  None    Patient presents with complaints of wheezing and difficulty breathing that has worsened over the past several days. He is not having any productive cough or fever. She denies any chest pain. Her workup reveals no significant laboratory or chest film findings. She is feeling better with treatment provided in the emergency department and I believe is appropriate for discharge. She will be started on prednisone and asked to continue her nebulizer treatments every 4 hours at home. She is to return if her symptoms worsen or change.  I personally performed the services described in this documentation, which was scribed in my presence. The recorded information has been reviewed and is accurate.     Lori Speak, MD 05/03/14 1544   Patient became short of breath and hypoxic with oxygen saturations dropping to  the upper 70s with ambulation. I've spoken with Dr. Nehemiah Settle from the hospitalist service who will admit the patient for an asthma exacerbation.  Lori Speak, MD 05/03/14 1630

## 2014-05-03 NOTE — Progress Notes (Addendum)
PHARMACIST - PHYSICIAN COMMUNICATION CONCERNING:  METFORMIN SAFE ADMINISTRATION POLICY  RECOMMENDATION: Metformin has been placed on DISCONTINUE (rejected order) STATUS and should be reordered only after any of the conditions below are ruled out.  Current safety recommendations include avoiding metformin for a minimum of 48 hours after the patient's exposure to intravenous contrast media.  DESCRIPTION:  The Pharmacy Committee has adopted a policy that restricts the use of metformin in hospitalized patients until all the contraindications to administration have been ruled out. Specific contraindications are: []  Serum creatinine ? 1.5 for males [x]  Serum creatinine ? 1.4 for females []  Shock, acute MI, sepsis, hypoxemia, dehydration []  Planned administration of intravenous iodinated contrast media []  Heart Failure patients with low EF []  Acute or chronic metabolic acidosis (including DKA)      Adjusted lovenox for DVT prophylaxis to 0.5 mg/kg for BMI>30.  New dose is 80 mg sq q24h.   [place] PharmD BCPS 05/03/2014 6:54 PM

## 2014-05-03 NOTE — ED Notes (Signed)
Patient ambulated in hallway. Steady gait. Oxygen decreased to 80 %. Returned to room. O2 reapplied.  MD aware.

## 2014-05-04 DIAGNOSIS — I129 Hypertensive chronic kidney disease with stage 1 through stage 4 chronic kidney disease, or unspecified chronic kidney disease: Secondary | ICD-10-CM | POA: Diagnosis not present

## 2014-05-04 DIAGNOSIS — Z7952 Long term (current) use of systemic steroids: Secondary | ICD-10-CM | POA: Diagnosis not present

## 2014-05-04 DIAGNOSIS — Z833 Family history of diabetes mellitus: Secondary | ICD-10-CM | POA: Diagnosis not present

## 2014-05-04 DIAGNOSIS — R1030 Lower abdominal pain, unspecified: Secondary | ICD-10-CM | POA: Diagnosis not present

## 2014-05-04 DIAGNOSIS — E669 Obesity, unspecified: Secondary | ICD-10-CM

## 2014-05-04 DIAGNOSIS — J45901 Unspecified asthma with (acute) exacerbation: Secondary | ICD-10-CM | POA: Diagnosis not present

## 2014-05-04 DIAGNOSIS — E119 Type 2 diabetes mellitus without complications: Secondary | ICD-10-CM | POA: Diagnosis not present

## 2014-05-04 DIAGNOSIS — N179 Acute kidney failure, unspecified: Secondary | ICD-10-CM | POA: Diagnosis not present

## 2014-05-04 DIAGNOSIS — Z794 Long term (current) use of insulin: Secondary | ICD-10-CM | POA: Diagnosis not present

## 2014-05-04 DIAGNOSIS — Z6841 Body Mass Index (BMI) 40.0 and over, adult: Secondary | ICD-10-CM | POA: Diagnosis not present

## 2014-05-04 DIAGNOSIS — E785 Hyperlipidemia, unspecified: Secondary | ICD-10-CM

## 2014-05-04 DIAGNOSIS — R0600 Dyspnea, unspecified: Secondary | ICD-10-CM | POA: Diagnosis not present

## 2014-05-04 DIAGNOSIS — E1129 Type 2 diabetes mellitus with other diabetic kidney complication: Secondary | ICD-10-CM | POA: Diagnosis not present

## 2014-05-04 DIAGNOSIS — D649 Anemia, unspecified: Secondary | ICD-10-CM | POA: Diagnosis not present

## 2014-05-04 DIAGNOSIS — E1165 Type 2 diabetes mellitus with hyperglycemia: Secondary | ICD-10-CM | POA: Diagnosis not present

## 2014-05-04 DIAGNOSIS — J9601 Acute respiratory failure with hypoxia: Secondary | ICD-10-CM | POA: Diagnosis not present

## 2014-05-04 DIAGNOSIS — Z452 Encounter for adjustment and management of vascular access device: Secondary | ICD-10-CM | POA: Diagnosis not present

## 2014-05-04 DIAGNOSIS — Z7982 Long term (current) use of aspirin: Secondary | ICD-10-CM | POA: Diagnosis not present

## 2014-05-04 DIAGNOSIS — Z791 Long term (current) use of non-steroidal anti-inflammatories (NSAID): Secondary | ICD-10-CM | POA: Diagnosis not present

## 2014-05-04 DIAGNOSIS — E662 Morbid (severe) obesity with alveolar hypoventilation: Secondary | ICD-10-CM | POA: Diagnosis not present

## 2014-05-04 DIAGNOSIS — I10 Essential (primary) hypertension: Secondary | ICD-10-CM | POA: Diagnosis not present

## 2014-05-04 DIAGNOSIS — R609 Edema, unspecified: Secondary | ICD-10-CM | POA: Diagnosis not present

## 2014-05-04 DIAGNOSIS — N183 Chronic kidney disease, stage 3 unspecified: Secondary | ICD-10-CM

## 2014-05-04 DIAGNOSIS — T380X5A Adverse effect of glucocorticoids and synthetic analogues, initial encounter: Secondary | ICD-10-CM | POA: Diagnosis not present

## 2014-05-04 DIAGNOSIS — E875 Hyperkalemia: Secondary | ICD-10-CM | POA: Diagnosis not present

## 2014-05-04 DIAGNOSIS — Z23 Encounter for immunization: Secondary | ICD-10-CM | POA: Diagnosis not present

## 2014-05-04 LAB — GLUCOSE, CAPILLARY
GLUCOSE-CAPILLARY: 215 mg/dL — AB (ref 70–99)
Glucose-Capillary: 238 mg/dL — ABNORMAL HIGH (ref 70–99)
Glucose-Capillary: 158 mg/dL — ABNORMAL HIGH (ref 70–99)
Glucose-Capillary: 192 mg/dL — ABNORMAL HIGH (ref 70–99)

## 2014-05-04 MED ORDER — PIOGLITAZONE HCL 15 MG PO TABS
15.0000 mg | ORAL_TABLET | Freq: Every day | ORAL | Status: DC
  Administered 2014-05-04 – 2014-05-11 (×8): 15 mg via ORAL
  Filled 2014-05-04 (×9): qty 1

## 2014-05-04 MED ORDER — LINAGLIPTIN 5 MG PO TABS
5.0000 mg | ORAL_TABLET | Freq: Every day | ORAL | Status: DC
  Administered 2014-05-04 – 2014-05-11 (×8): 5 mg via ORAL
  Filled 2014-05-04 (×8): qty 1

## 2014-05-04 MED ORDER — INSULIN ASPART PROT & ASPART (70-30 MIX) 100 UNIT/ML ~~LOC~~ SUSP
52.0000 [IU] | Freq: Two times a day (BID) | SUBCUTANEOUS | Status: DC
  Administered 2014-05-04 – 2014-05-05 (×3): 52 [IU] via SUBCUTANEOUS

## 2014-05-04 NOTE — Progress Notes (Signed)
UR completed 

## 2014-05-04 NOTE — Progress Notes (Addendum)
[facility] PROGRESS NOTE  [name] [id number] DOB: 1965-08-12 DOA: 05/03/2014 PCP: [name], MD  Assessment/Plan: Asthma with Acute Exacerbation/Acute Hypoxemic Respiratory Failure -Still with wheezing today and significant SOB with ambulation. -Continue steroids at current dose. -PRN nebs. -Supplement oxygen as needed; wean as appropriate.  Morbid Obesity  Acute on CKD Stage III -Baseline Cr around 1.0-1.99. -Holding lasix/ARB. -Advised to drink copious PO fluids. -If Cr continues to rise in am, will give IVF. -Will need OP renal follow up (never seen a nephrologist before)  HTN -Well controlled. -Lasix/ARB on hold due to ARF.  DM II -CBGs elevated due to steroid use. -Will increase 70/30 from 48 to 52 units BID.  Hyperlipidemia -Continue statin.  Code Status: Full Code Family Communication: Multiple family members at bedside updated on plan of care  Disposition Plan: home when medically ready.   Consultants:  None   Antibiotics:  None   Subjective: No complaints. Still feels SOB when walking to the bathroom.  Objective: Filed Vitals:   05/04/14 0530 05/04/14 0830 05/04/14 0833 05/04/14 1341  BP: 117/60     Pulse: 74  73   Temp: 98.5 F (36.9 C)     TempSrc: Oral     Resp: 22  18   Height:      Weight: 183.5 kg (404 lb 8.7 oz)     SpO2: 96% 96% 96% 93%    Intake/Output Summary (Last 24 hours) at 05/04/14 1431 Last data filed at 05/04/14 0930  Gross per 24 hour  Intake    720 ml  Output    400 ml  Net    320 ml   Filed Weights   05/03/14 1343 05/03/14 1816 05/04/14 0530  Weight: 172.82 kg (381 lb) 183.4 kg (404 lb 5.2 oz) 183.5 kg (404 lb 8.7 oz)    Exam:   General:  AA Ox3  Cardiovascular: RRR  Respiratory: decreased BS due to body habitus, but some faint wheezing auscultated.  Abdomen: obese/S/NT/ND/+BS  Extremities: 1+ edema bilaterally   Neurologic:  Intact/non-focal  Data Reviewed: Basic  Metabolic Panel:  Recent Labs Lab 05/03/14 1406  NA 139  K 5.0  CL 107  CO2 29  GLUCOSE 140*  BUN 41*  CREATININE 1.79*  CALCIUM 8.7   Liver Function Tests: No results for input(s): AST, ALT, ALKPHOS, BILITOT, PROT, ALBUMIN in the last 168 hours. No results for input(s): LIPASE, AMYLASE in the last 168 hours. No results for input(s): AMMONIA in the last 168 hours. CBC:  Recent Labs Lab 05/03/14 1406  WBC 10.7*  NEUTROABS 6.6  HGB 11.2*  HCT 37.0  MCV 86.4  PLT 187   Cardiac Enzymes: No results for input(s): CKTOTAL, CKMB, CKMBINDEX, TROPONINI in the last 168 hours. BNP (last 3 results)  Recent Labs  05/03/14 1406  BNP 122.0*    ProBNP (last 3 results) No results for input(s): PROBNP in the last 8760 hours.  CBG:  Recent Labs Lab 05/03/14 1831 05/03/14 2135 05/04/14 0728 05/04/14 1112  GLUCAP 82 141* 238* 215*    No results found for this or any previous visit (from the past 240 hour(s)).   Studies: Dg Chest Port 1 View  05/03/2014   CLINICAL DATA:  Short of breath. Wheezing since last week. Asthma. Diabetes.  EXAM: PORTABLE CHEST - 1 VIEW  COMPARISON:  02/06/2014  FINDINGS: Moderately degraded exam secondary to patient body habitus and AP portable technique. Midline trachea. Cardiomegaly accentuated by AP portable technique. No gross  pleural fluid. No pneumothorax. Pulmonary interstitial prominence is at least partially due to technique and low lung volumes. No lobar consolidation.  IMPRESSION: Moderately degraded exam. Consider PA and lateral radiographs if possible.  Cardiomegaly and low lung volumes. Pulmonary interstitial prominence which is at least partially due to low volumes and technique. Cannot exclude mild pulmonary venous congestion.   Electronically Signed   By: Abigail Miyamoto M.D.   On: 05/03/2014 16:03    Scheduled Meds: . aspirin EC  81 mg Oral Daily  . atorvastatin  20 mg Oral QHS  . carvedilol  6.25 mg Oral BID  . enoxaparin (LOVENOX)  injection  80 mg Subcutaneous Q24H  . gabapentin  300 mg Oral TID  . insulin aspart  0-20 Units Subcutaneous TID WC  . insulin aspart  0-5 Units Subcutaneous QHS  . insulin aspart protamine- aspart  48 Units Subcutaneous BID WC  . ipratropium-albuterol  3 mL Nebulization Q6H  . linagliptin  5 mg Oral Daily   And  . pioglitazone  15 mg Oral Daily  . methylPREDNISolone (SOLU-MEDROL) injection  60 mg Intravenous Q12H  . mometasone-formoterol  2 puff Inhalation BID  . montelukast  10 mg Oral Daily   Continuous Infusions:   Active Problems:   Asthma exacerbation   ARF (acute renal failure)   Acute respiratory failure with hypoxia   Diabetes mellitus type 2 in obese   Hypertension   Hyperlipidemia   Morbid obesity   CKD (chronic kidney disease) stage 3, GFR 30-59 ml/min    Time spent: 35 minutes. Greater than 50% of this time was spent in direct contact with the patient coordinating care.    Lelon Frohlich  [facility] Pager [phone]  If 7PM-7AM, please contact night-coverage at [email or web address], password [facility] 05/04/2014, 2:31 PM

## 2014-05-05 DIAGNOSIS — I10 Essential (primary) hypertension: Secondary | ICD-10-CM

## 2014-05-05 LAB — URINALYSIS, ROUTINE W REFLEX MICROSCOPIC
Glucose, UA: NEGATIVE mg/dL
Hgb urine dipstick: NEGATIVE
Nitrite: NEGATIVE
PROTEIN: NEGATIVE mg/dL
Specific Gravity, Urine: >1.03 — ABNORMAL HIGH (ref 1.005–1.030)
UROBILINOGEN UA: 0.2 mg/dL (ref 0.0–1.0)
pH: 5 (ref 5.0–8.0)

## 2014-05-05 LAB — URINE MICROSCOPIC-ADD ON

## 2014-05-05 LAB — GLUCOSE, CAPILLARY
GLUCOSE-CAPILLARY: 211 mg/dL — AB (ref 70–99)
GLUCOSE-CAPILLARY: 233 mg/dL — AB (ref 70–99)
GLUCOSE-CAPILLARY: 219 mg/dL — AB (ref 70–99)
Glucose-Capillary: 241 mg/dL — ABNORMAL HIGH (ref 70–99)

## 2014-05-05 LAB — BASIC METABOLIC PANEL
Anion gap: 5 (ref 5–15)
Anion gap: 6 (ref 5–15)
BUN: 67 mg/dL — AB (ref 6–23)
BUN: 70 mg/dL — AB (ref 6–23)
CALCIUM: 8.4 mg/dL (ref 8.4–10.5)
CHLORIDE: 105 mmol/L (ref 96–112)
CO2: 26 mmol/L (ref 19–32)
CO2: 27 mmol/L (ref 19–32)
CREATININE: 2.69 mg/dL — AB (ref 0.50–1.10)
Calcium: 8.4 mg/dL (ref 8.4–10.5)
Chloride: 105 mmol/L (ref 96–112)
Creatinine, Ser: 2.59 mg/dL — ABNORMAL HIGH (ref 0.50–1.10)
GFR calc Af Amer: 24 mL/min — ABNORMAL LOW (ref >90)
GFR calc Af Amer: 23 mL/min — ABNORMAL LOW (ref >90)
GFR calc non Af Amer: 21 mL/min — ABNORMAL LOW (ref >90)
GFR calc non Af Amer: 20 mL/min — ABNORMAL LOW (ref >90)
GLUCOSE: 228 mg/dL — AB (ref 70–99)
Glucose, Bld: 203 mg/dL — ABNORMAL HIGH (ref 70–99)
Potassium: 6.3 mmol/L (ref 3.5–5.1)
Potassium: 5.5 mmol/L — ABNORMAL HIGH (ref 3.5–5.1)
SODIUM: 136 mmol/L (ref 135–145)
Sodium: 138 mmol/L (ref 135–145)

## 2014-05-05 LAB — CBC
HCT: 38.1 % (ref 36.0–46.0)
Hemoglobin: 11.3 g/dL — ABNORMAL LOW (ref 12.0–15.0)
MCH: 25.9 pg — ABNORMAL LOW (ref 26.0–34.0)
MCHC: 29.7 g/dL — ABNORMAL LOW (ref 30.0–36.0)
MCV: 87.4 fL (ref 78.0–100.0)
Platelets: 209 10*3/uL (ref 150–400)
RBC: 4.36 MIL/uL (ref 3.87–5.11)
RDW: 15 % (ref 11.5–15.5)
WBC: 13.7 10*3/uL — AB (ref 4.0–10.5)

## 2014-05-05 MED ORDER — METHYLPREDNISOLONE SODIUM SUCC 125 MG IJ SOLR
60.0000 mg | Freq: Four times a day (QID) | INTRAMUSCULAR | Status: DC
Start: 2014-05-05 — End: 2014-05-06
  Administered 2014-05-05 – 2014-05-06 (×4): 60 mg via INTRAVENOUS
  Filled 2014-05-05 (×3): qty 2

## 2014-05-05 MED ORDER — SODIUM CHLORIDE 0.9 % IV SOLN
INTRAVENOUS | Status: DC
  Administered 2014-05-05 – 2014-05-06 (×2): via INTRAVENOUS

## 2014-05-05 MED ORDER — SODIUM POLYSTYRENE SULFONATE 15 GM/60ML PO SUSP
15.0000 g | Freq: Once | ORAL | Status: AC
  Administered 2014-05-05: 15 g via ORAL
  Filled 2014-05-05: qty 60

## 2014-05-05 MED ORDER — SODIUM POLYSTYRENE SULFONATE 15 GM/60ML PO SUSP
30.0000 g | Freq: Once | ORAL | Status: AC
  Administered 2014-05-05: 30 g via ORAL
  Filled 2014-05-05: qty 120

## 2014-05-05 MED ORDER — LEVOFLOXACIN IN D5W 750 MG/150ML IV SOLN
750.0000 mg | INTRAVENOUS | Status: DC
  Administered 2014-05-05 – 2014-05-07 (×2): 750 mg via INTRAVENOUS
  Filled 2014-05-05 (×3): qty 150

## 2014-05-05 NOTE — Care Management Note (Addendum)
    Page 1 of 1   05/09/2014     1:35:34 PM CARE MANAGEMENT NOTE 05/09/2014  Patient:  [name], [name]   Account Number:  [id number]  Date Initiated:  05/05/2014  Documentation initiated by:  Theophilus Kinds  Subjective/Objective Assessment:   Pt admitted from home with asthma exacerbation. Pt lives with family and will return home at discharge. Pt is independent with ADL's.     Action/Plan:   Will need home O2 assessment prior to discharge. Will continue to follow for discharge planning needs.   Anticipated DC Date:  05/06/2014   Anticipated DC Plan:  [place]  CM consult      Choice offered to / List presented to:             Status of service:  In process, will continue to follow Medicare Important Message given?  YES (If response is "NO", the following Medicare IM given date fields will be blank) Date Medicare IM given:  05/09/2014 Medicare IM given by:  Theophilus Kinds Date Additional Medicare IM given:   Additional Medicare IM given by:    Discharge Disposition:  HOME/SELF CARE  Per UR Regulation:    If discussed at Long Length of Stay Meetings, dates discussed:    Comments:  05/09/14 [place], RN BSN CM Pt still receiving IV lasix BID. If pt d/c over the weekend will need repeat home O2 assessment. IF pt qualifies would like home O2 or any other DME, pt wants it through [employer].  05/05/14 [place], RN BSN CM

## 2014-05-05 NOTE — Progress Notes (Signed)
[facility] PROGRESS NOTE  ISOLENE FORNAL [id number] DOB: 1965/07/26 DOA: 05/03/2014 PCP: [name], MD  Assessment/Plan: Asthma with Acute Exacerbation/Acute Hypoxemic Respiratory Failure - BS very diminished with wheezing and continued SOB with ambulation. -Continue steroids at somewhat higher dose. Add levaquin as well.  -PRN nebs. - sats 94% on 2L. Will mobilize and document sats. Supplement oxygen as needed; wean as appropriate.  Morbid Obesity: BMI 65.4. Nutritional consult  Acute on CKD Stage III -Baseline Cr around 1.0-1.99. Worsening this am. Will provide IV fluids. Continue to hold lasix/ARB. -Urine output only fair. Some complaints of retention. Bedside US with >200 mls post void.  -Will need OP renal follow up (never seen a nephrologist before) -if no improvement with fluids and holding nephrotoxins will consider renal US and nephrology consult  HTN -Well controlled. -Lasix/ARB on hold due to ARF.  DM II -CBGs range 191-232. Steroids contributing. Healthy appetite as well.  elevated due to steroid use. Check A1c. Metformin on hold.  Hyperkalemia -related to #2. Kayexalate given with no results. Will repeat. Will recheck in am -  Hyperlipidemia -Continue statin.  Code Status: full Family Communication: none Disposition Plan: home when ready   Consultants:  none  Procedures:  none  Antibiotics: levaquin 05/05/14>> HPI/Subjective: Sitting on side of bed eating lunch. Reports only very little improvement with sob  Objective: Filed Vitals:   05/05/14 0647  BP: 107/55  Pulse: 69  Temp: 98 F (36.7 C)  Resp: 18    Intake/Output Summary (Last 24 hours) at 05/05/14 1212 Last data filed at 05/05/14 0900  Gross per 24 hour  Intake    720 ml  Output    200 ml  Net    520 ml   Filed Weights   05/03/14 1816 05/04/14 0530 05/05/14 0647  Weight: 183.4 kg (404 lb 5.2 oz) 183.5 kg (404 lb 8.7 oz) 183.9 kg (405 lb 6.8 oz)     Exam:   General:  Morbidly obese appears comfortable  Cardiovascular: RRR no MGR HS distant  Respiratory: very diminished BS throughout mild expiratory wheeze  Abdomen: obese soft +BS  Musculoskeletal: no clubbing or cyanosis   Data Reviewed: Basic Metabolic Panel:  Recent Labs Lab 05/03/14 1406 05/05/14 0642  NA 139 136  K 5.0 6.3*  CL 107 105  CO2 29 26  GLUCOSE 140* 228*  BUN 41* 67*  CREATININE 1.79* 2.59*  CALCIUM 8.7 8.4   Liver Function Tests: No results for input(s): AST, ALT, ALKPHOS, BILITOT, PROT, ALBUMIN in the last 168 hours. No results for input(s): LIPASE, AMYLASE in the last 168 hours. No results for input(s): AMMONIA in the last 168 hours. CBC:  Recent Labs Lab 05/03/14 1406 05/05/14 0642  WBC 10.7* 13.7*  NEUTROABS 6.6  --   HGB 11.2* 11.3*  HCT 37.0 38.1  MCV 86.4 87.4  PLT 187 209   Cardiac Enzymes: No results for input(s): CKTOTAL, CKMB, CKMBINDEX, TROPONINI in the last 168 hours. BNP (last 3 results)  Recent Labs  05/03/14 1406  BNP 122.0*    ProBNP (last 3 results) No results for input(s): PROBNP in the last 8760 hours.  CBG:  Recent Labs Lab 05/04/14 1112 05/04/14 1633 05/04/14 2002 05/05/14 0747 05/05/14 1139  GLUCAP 215* 158* 192* 211* 233*    No results found for this or any previous visit (from the past 240 hour(s)).   Studies: Dg Chest Port 1 View  05/03/2014   CLINICAL DATA:  Short of breath. Wheezing since last  week. Asthma. Diabetes.  EXAM: PORTABLE CHEST - 1 VIEW  COMPARISON:  02/06/2014  FINDINGS: Moderately degraded exam secondary to patient body habitus and AP portable technique. Midline trachea. Cardiomegaly accentuated by AP portable technique. No gross pleural fluid. No pneumothorax. Pulmonary interstitial prominence is at least partially due to technique and low lung volumes. No lobar consolidation.  IMPRESSION: Moderately degraded exam. Consider PA and lateral radiographs if possible.   Cardiomegaly and low lung volumes. Pulmonary interstitial prominence which is at least partially due to low volumes and technique. Cannot exclude mild pulmonary venous congestion.   Electronically Signed   By: Abigail Miyamoto M.D.   On: 05/03/2014 16:03    Scheduled Meds: . aspirin EC  81 mg Oral Daily  . atorvastatin  20 mg Oral QHS  . carvedilol  6.25 mg Oral BID  . enoxaparin (LOVENOX) injection  80 mg Subcutaneous Q24H  . gabapentin  300 mg Oral TID  . insulin aspart  0-20 Units Subcutaneous TID WC  . insulin aspart  0-5 Units Subcutaneous QHS  . insulin aspart protamine- aspart  52 Units Subcutaneous BID WC  . ipratropium-albuterol  3 mL Nebulization Q6H  . linagliptin  5 mg Oral Daily   And  . pioglitazone  15 mg Oral Daily  . methylPREDNISolone (SOLU-MEDROL) injection  60 mg Intravenous Q6H  . mometasone-formoterol  2 puff Inhalation BID  . montelukast  10 mg Oral Daily  . sodium polystyrene  15 g Oral Once   Continuous Infusions: . sodium chloride 75 mL/hr at 05/05/14 [id number]    Principal Problem:   Asthma exacerbation Active Problems:   ARF (acute renal failure)   Acute respiratory failure with hypoxia   Diabetes mellitus type 2 in obese   Hypertension   Hyperlipidemia   Morbid obesity   CKD (chronic kidney disease) stage 3, GFR 30-59 ml/min    Time spent: [place] [facility] Pager [phone]. If 7PM-7AM, please contact night-coverage at [email or web address], password [facility] 05/05/2014, 12:12 PM  LOS: 1 day

## 2014-05-05 NOTE — Progress Notes (Signed)
ANTIBIOTIC CONSULT NOTE  Pharmacy Consult for Levaquin Indication: COPD exacerbation  No Known Allergies  Patient Measurements: Height: 5\' 6"  (167.6 cm) Weight: (!) 405 lb 6.8 oz (183.9 kg) IBW/kg (Calculated) : 59.3  Vital Signs: Temp: 98 F (36.7 C) (03/07 0647) Temp Source: Oral (03/07 0647) BP: 107/55 mmHg (03/07 0647) Pulse Rate: 69 (03/07 0647) Intake/Output from previous day: 03/06 0701 - 03/07 0700 In: 720 [P.O.:720] Out: 200 [Urine:200] Intake/Output from this shift: Total I/O In: 240 [P.O.:240] Out: -   Labs:  Recent Labs  05/03/14 1406 05/05/14 0642  WBC 10.7* 13.7*  HGB 11.2* 11.3*  PLT 187 209  CREATININE 1.79* 2.59*   Estimated Creatinine Clearance: 46.2 mL/min (by C-G formula based on Cr of 2.59). No results for input(s): VANCOTROUGH, VANCOPEAK, VANCORANDOM, GENTTROUGH, GENTPEAK, GENTRANDOM, TOBRATROUGH, TOBRAPEAK, TOBRARND, AMIKACINPEAK, AMIKACINTROU, AMIKACIN in the last 72 hours.   Microbiology: No results found for this or any previous visit (from the past 720 hour(s)).  Anti-infectives    Start     Dose/Rate Route Frequency Ordered Stop   05/05/14 1300  levofloxacin (LEVAQUIN) IVPB 750 mg     750 mg 100 mL/hr over 90 Minutes Intravenous Every 48 hours 05/05/14 1233        Assessment: 49 yo obese F with asthma/COPD exacerbation to start on empiric Levaquin. Acute renal failure noted.  NCrCl ~ 70ml/min  Goal of Therapy:  Eradicate infection.  Plan:  Levaquin 750mg  IV q48h Change to PO once appropriate Monitor renal function Duration of therapy per MD- recommend 5 days  Biagio Borg 05/05/2014,12:34 PM

## 2014-05-05 NOTE — Progress Notes (Signed)
CRITICAL VALUE ALERT  Critical value received:  Potassium 6.3  Date of notification:  05/05/14  Time of notification:  0723  Critical value read back:Yes.    Nurse who received alert:  Sharyn Blitz, RN  MD notified (1st page):  Dr. Roderic Palau  Time of first page:  0725  MD notified (2nd page):  Time of second page:  Responding MD:  Dr. Roderic Palau  Time MD responded:

## 2014-05-06 ENCOUNTER — Inpatient Hospital Stay (HOSPITAL_COMMUNITY): Payer: Medicare Other

## 2014-05-06 DIAGNOSIS — N179 Acute kidney failure, unspecified: Secondary | ICD-10-CM | POA: Insufficient documentation

## 2014-05-06 LAB — BASIC METABOLIC PANEL
Anion gap: 7 (ref 5–15)
BUN: 77 mg/dL — AB (ref 6–23)
CHLORIDE: 104 mmol/L (ref 96–112)
CO2: 27 mmol/L (ref 19–32)
CREATININE: 2.82 mg/dL — AB (ref 0.50–1.10)
Calcium: 8.4 mg/dL (ref 8.4–10.5)
GFR calc Af Amer: 22 mL/min — ABNORMAL LOW (ref >90)
GFR calc non Af Amer: 19 mL/min — ABNORMAL LOW (ref >90)
Glucose, Bld: 203 mg/dL — ABNORMAL HIGH (ref 70–99)
Potassium: 5.7 mmol/L — ABNORMAL HIGH (ref 3.5–5.1)
Sodium: 138 mmol/L (ref 135–145)

## 2014-05-06 LAB — URINALYSIS, ROUTINE W REFLEX MICROSCOPIC
BILIRUBIN URINE: NEGATIVE
GLUCOSE, UA: NEGATIVE mg/dL
Hgb urine dipstick: NEGATIVE
Ketones, ur: NEGATIVE mg/dL
Leukocytes, UA: NEGATIVE
NITRITE: NEGATIVE
PH: 5 (ref 5.0–8.0)
Protein, ur: NEGATIVE mg/dL
Urobilinogen, UA: 0.2 mg/dL (ref 0.0–1.0)

## 2014-05-06 LAB — GLUCOSE, CAPILLARY
GLUCOSE-CAPILLARY: 152 mg/dL — AB (ref 70–99)
Glucose-Capillary: 200 mg/dL — ABNORMAL HIGH (ref 70–99)
Glucose-Capillary: 224 mg/dL — ABNORMAL HIGH (ref 70–99)
Glucose-Capillary: 211 mg/dL — ABNORMAL HIGH (ref 70–99)

## 2014-05-06 LAB — URINE CULTURE

## 2014-05-06 LAB — SODIUM, URINE, RANDOM: Sodium, Ur: 25 mmol/L

## 2014-05-06 LAB — CREATININE, URINE, RANDOM: Creatinine, Urine: 232.48 mg/dL

## 2014-05-06 MED ORDER — GUAIFENESIN ER 600 MG PO TB12
1200.0000 mg | ORAL_TABLET | Freq: Two times a day (BID) | ORAL | Status: DC
  Administered 2014-05-06 – 2014-05-11 (×11): 1200 mg via ORAL
  Filled 2014-05-06 (×11): qty 2

## 2014-05-06 MED ORDER — METHYLPREDNISOLONE SODIUM SUCC 40 MG IJ SOLR
40.0000 mg | Freq: Four times a day (QID) | INTRAMUSCULAR | Status: DC
  Administered 2014-05-06 (×3): 40 mg via INTRAVENOUS
  Filled 2014-05-06 (×3): qty 1

## 2014-05-06 MED ORDER — FUROSEMIDE 10 MG/ML IJ SOLN
60.0000 mg | Freq: Two times a day (BID) | INTRAMUSCULAR | Status: DC
  Administered 2014-05-06 (×2): 60 mg via INTRAVENOUS
  Filled 2014-05-06 (×2): qty 6

## 2014-05-06 MED ORDER — INSULIN ASPART PROT & ASPART (70-30 MIX) 100 UNIT/ML ~~LOC~~ SUSP
56.0000 [IU] | Freq: Two times a day (BID) | SUBCUTANEOUS | Status: DC
  Administered 2014-05-06 – 2014-05-08 (×6): 56 [IU] via SUBCUTANEOUS

## 2014-05-06 MED ORDER — IPRATROPIUM-ALBUTEROL 0.5-2.5 (3) MG/3ML IN SOLN
3.0000 mL | Freq: Three times a day (TID) | RESPIRATORY_TRACT | Status: DC
  Administered 2014-05-07 – 2014-05-09 (×7): 3 mL via RESPIRATORY_TRACT
  Filled 2014-05-06 (×8): qty 3

## 2014-05-06 NOTE — Progress Notes (Signed)
[facility] PROGRESS NOTE  [name] [id number] DOB: 1965-08-20 DOA: 05/03/2014 PCP: [name], MD  Assessment/Plan: Asthma with Acute Exacerbation/Acute Hypoxemic Respiratory Failure - BS with improved air flow. sats improving. Continue steroids but begin taper. continue levaquin day #2.  Scheduled duonebs. Will wean oxygen and ambulate.   Morbid Obesity: BMI 65.4. Nutritional consult  Acute on CKD Stage III -Baseline Cr around 1.0-1.99. Creatinine continues to trend upward in spite of IV fluids. Holding lasix/ARB. Urine output not measured. Complaints of retention. Bedside US with >200 mls post void. Have requested nephrology consult.   HTN -Well controlled. -Lasix/ARB on hold due to ARF.  DM II -CBGs range 200-241. Steroids contributing will start to taper. Healthy appetite as well. A1c. Metformin on hold.  Hyperkalemia -related to #2. Kayexalate x2. Improved but remains elevated. Repeat kayexalate. Await nephrology input   Hyperlipidemia 1. -Continue statin.  Code Status: full Family Communication: none present Disposition Plan: home when ready   Consultants:  nephrology  Procedures:  none  Antibiotics:  levaquin 05/05/14>>  HPI/Subjective: Awake. Reports some improvement in respiratory effort. Requests mobilization  Objective: Filed Vitals:   05/06/14 0540  BP: 120/69  Pulse: 70  Temp: 97.6 F (36.4 C)  Resp: 20    Intake/Output Summary (Last 24 hours) at 05/06/14 0837 Last data filed at 05/06/14 0539  Gross per 24 hour  Intake 2608.25 ml  Output      0 ml  Net 2608.25 ml   Filed Weights   05/03/14 1816 05/04/14 0530 05/05/14 0647  Weight: 183.4 kg (404 lb 5.2 oz) 183.5 kg (404 lb 8.7 oz) 183.9 kg (405 lb 6.8 oz)    Exam:   General:  Morbidly obese. Appears comfortable  Cardiovascular: RRR no m/g/r HS distant trace LE edema  Respiratory: improved air movement. Less wheeze  Abdomen: obese soft +BS non-tender to  palpation  Musculoskeletal: joints without swelling/erythema   Data Reviewed: Basic Metabolic Panel:  Recent Labs Lab 05/03/14 1406 05/05/14 0642 05/05/14 1652 05/06/14 0624  NA 139 136 138 138  K 5.0 6.3* 5.5* 5.7*  CL 107 105 105 104  CO2 29 26 27 27   GLUCOSE 140* 228* 203* 203*  BUN 41* 67* 70* 77*  CREATININE 1.79* 2.59* 2.69* 2.82*  CALCIUM 8.7 8.4 8.4 8.4   Liver Function Tests: No results for input(s): AST, ALT, ALKPHOS, BILITOT, PROT, ALBUMIN in the last 168 hours. No results for input(s): LIPASE, AMYLASE in the last 168 hours. No results for input(s): AMMONIA in the last 168 hours. CBC:  Recent Labs Lab 05/03/14 1406 05/05/14 0642  WBC 10.7* 13.7*  NEUTROABS 6.6  --   HGB 11.2* 11.3*  HCT 37.0 38.1  MCV 86.4 87.4  PLT 187 209   Cardiac Enzymes: No results for input(s): CKTOTAL, CKMB, CKMBINDEX, TROPONINI in the last 168 hours. BNP (last 3 results)  Recent Labs  05/03/14 1406  BNP 122.0*    ProBNP (last 3 results) No results for input(s): PROBNP in the last 8760 hours.  CBG:  Recent Labs Lab 05/05/14 0747 05/05/14 1139 05/05/14 1700 05/05/14 2113 05/06/14 0804  GLUCAP 211* 233* 219* 241* 200*    No results found for this or any previous visit (from the past 240 hour(s)).   Studies: No results found.  Scheduled Meds: . aspirin EC  81 mg Oral Daily  . atorvastatin  20 mg Oral QHS  . carvedilol  6.25 mg Oral BID  . enoxaparin (LOVENOX) injection  80 mg Subcutaneous Q24H  .  gabapentin  300 mg Oral TID  . insulin aspart  0-20 Units Subcutaneous TID WC  . insulin aspart  0-5 Units Subcutaneous QHS  . insulin aspart protamine- aspart  56 Units Subcutaneous BID WC  . ipratropium-albuterol  3 mL Nebulization Q6H  . levofloxacin (LEVAQUIN) IV  750 mg Intravenous Q48H  . linagliptin  5 mg Oral Daily   And  . pioglitazone  15 mg Oral Daily  . methylPREDNISolone (SOLU-MEDROL) injection  60 mg Intravenous Q6H  . mometasone-formoterol  2  puff Inhalation BID  . montelukast  10 mg Oral Daily   Continuous Infusions: . sodium chloride 75 mL/hr at 05/06/14 0539    Principal Problem:   Asthma exacerbation Active Problems:   ARF (acute renal failure)   Acute respiratory failure with hypoxia   Diabetes mellitus type 2 in obese   Hypertension   Hyperlipidemia   Morbid obesity   CKD (chronic kidney disease) stage 3, GFR 30-59 ml/min    Time spent: 35 minutes    [facility] Pager [phone]. If 7PM-7AM, please contact night-coverage at [email or web address], password [facility] 05/06/2014, 8:37 AM  LOS: 2 days

## 2014-05-06 NOTE — Progress Notes (Signed)
SATURATION QUALIFICATIONS: (This note is used to comply with regulatory documentation for home oxygen)  Patient Saturations on Room Air at Rest = 94%  Patient Saturations on Room Air while Ambulating = 81%  Patient Saturations on 2 Liters of oxygen while Ambulating = 95%  Patient became very short of breath after walking about 50 feet. Will encourage patient to ambulate more throughout shift.

## 2014-05-06 NOTE — Progress Notes (Signed)
Inpatient Diabetes Program Recommendations  AACE/ADA: New Consensus Statement on Inpatient Glycemic Control (2013)  Target Ranges:  Prepandial:   less than 140 mg/dL      Peak postprandial:   less than 180 mg/dL (1-2 hours)      Critically ill patients:  140 - 180 mg/dL   Results for MARIKAY, KROLICK (MRN [id number]) as of 05/06/2014 07:17  Ref. Range 05/05/2014 07:47 05/05/2014 11:39 05/05/2014 17:00 05/05/2014 21:13  Glucose-Capillary Latest Range: 70-99 mg/dL 211 (H) 233 (H) 219 (H) 241 (H)    Diabetes history: DM2 Outpatient Diabetes medications: 70/30 48 units BID, Metformin 1000 mg BID, Oseni 25-15 mg daily Current orders for Inpatient glycemic control: 70/30 52 units BID, Novolog 0-20 units TID, Novolog 0-5 units HS, Tradjenta 5 mg daily, Actos 15 mg daily   Inpatient Diabetes Program Recommendations Insulin - Basal: Please consider increasing 70/30 to 56 units BID.  Thanks, Barnie Alderman, RN, MSN, CCRN, CDE Diabetes Coordinator Inpatient Diabetes Program [phone] (Team Pager) [phone] ([facility] office) [phone] [facility] office)

## 2014-05-06 NOTE — Progress Notes (Signed)
Notified MD that patient had only voided around 200 cc since this AM after receiving IV lasix. MD ordered and In and out cath. Patient tolerated in and out cath with no problems. In and out cath was 300 cc. Will let MD know and continue to monitor patient at this time.
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
## 2014-05-07 ENCOUNTER — Inpatient Hospital Stay (HOSPITAL_COMMUNITY): Payer: Medicare Other

## 2014-05-07 ENCOUNTER — Encounter (HOSPITAL_COMMUNITY): Payer: Medicare Other

## 2014-05-07 DIAGNOSIS — R0600 Dyspnea, unspecified: Secondary | ICD-10-CM

## 2014-05-07 LAB — BASIC METABOLIC PANEL
ANION GAP: 5 (ref 5–15)
BUN: 87 mg/dL — AB (ref 6–23)
CALCIUM: 8.2 mg/dL — AB (ref 8.4–10.5)
CHLORIDE: 105 mmol/L (ref 96–112)
CO2: 26 mmol/L (ref 19–32)
Creatinine, Ser: 3.02 mg/dL — ABNORMAL HIGH (ref 0.50–1.10)
GFR calc Af Amer: 20 mL/min — ABNORMAL LOW (ref >90)
GFR calc non Af Amer: 17 mL/min — ABNORMAL LOW (ref >90)
Glucose, Bld: 178 mg/dL — ABNORMAL HIGH (ref 70–99)
POTASSIUM: 5.6 mmol/L — AB (ref 3.5–5.1)
Sodium: 136 mmol/L (ref 135–145)

## 2014-05-07 LAB — GLUCOSE, CAPILLARY
GLUCOSE-CAPILLARY: 155 mg/dL — AB (ref 70–99)
GLUCOSE-CAPILLARY: 192 mg/dL — AB (ref 70–99)
Glucose-Capillary: 179 mg/dL — ABNORMAL HIGH (ref 70–99)
Glucose-Capillary: 232 mg/dL — ABNORMAL HIGH (ref 70–99)

## 2014-05-07 LAB — HEMOGLOBIN A1C
Hgb A1c MFr Bld: 10.9 % — ABNORMAL HIGH (ref 4.8–5.6)
MEAN PLASMA GLUCOSE: 266 mg/dL

## 2014-05-07 MED ORDER — FUROSEMIDE 10 MG/ML IJ SOLN
40.0000 mg | Freq: Two times a day (BID) | INTRAMUSCULAR | Status: DC
  Administered 2014-05-07 – 2014-05-09 (×4): 40 mg via INTRAVENOUS
  Filled 2014-05-07 (×4): qty 4

## 2014-05-07 MED ORDER — SODIUM CHLORIDE 0.9 % IJ SOLN
10.0000 mL | INTRAMUSCULAR | Status: DC | PRN
  Administered 2014-05-09: 10 mL
  Filled 2014-05-07: qty 40

## 2014-05-07 MED ORDER — SODIUM CHLORIDE 0.9 % IJ SOLN
10.0000 mL | Freq: Two times a day (BID) | INTRAMUSCULAR | Status: DC
  Administered 2014-05-07: 10 mL
  Administered 2014-05-07: 40 mL
  Administered 2014-05-08 – 2014-05-11 (×6): 10 mL

## 2014-05-07 MED ORDER — METHYLPREDNISOLONE SODIUM SUCC 40 MG IJ SOLR
40.0000 mg | Freq: Two times a day (BID) | INTRAMUSCULAR | Status: DC
  Administered 2014-05-07 – 2014-05-09 (×5): 40 mg via INTRAVENOUS
  Filled 2014-05-07 (×5): qty 1

## 2014-05-07 MED ORDER — SODIUM POLYSTYRENE SULFONATE 15 GM/60ML PO SUSP
30.0000 g | Freq: Once | ORAL | Status: AC
  Administered 2014-05-07: 30 g via ORAL
  Filled 2014-05-07: qty 120

## 2014-05-07 MED ORDER — CARVEDILOL 3.125 MG PO TABS
3.1250 mg | ORAL_TABLET | Freq: Two times a day (BID) | ORAL | Status: DC
  Administered 2014-05-07 – 2014-05-11 (×9): 3.125 mg via ORAL
  Filled 2014-05-07 (×9): qty 1

## 2014-05-07 NOTE — Progress Notes (Addendum)
[name]  MRN: [id number]  DOB/AGE: November 28, 1965 49 y.o.  Primary Care Physician:HILL,GERALD K, MD  Admit date: 05/03/2014  Chief Complaint:  Chief Complaint  Patient presents with  . Wheezing    S-Pt presented on  05/03/2014 with  Chief Complaint  Patient presents with  . Wheezing  .    Pt today feels better.  Meds . aspirin EC  81 mg Oral Daily  . atorvastatin  20 mg Oral QHS  . carvedilol  6.25 mg Oral BID  . enoxaparin (LOVENOX) injection  80 mg Subcutaneous Q24H  . furosemide  60 mg Intravenous BID  . gabapentin  300 mg Oral TID  . guaiFENesin  1,200 mg Oral BID  . insulin aspart  0-20 Units Subcutaneous TID WC  . insulin aspart  0-5 Units Subcutaneous QHS  . insulin aspart protamine- aspart  56 Units Subcutaneous BID WC  . ipratropium-albuterol  3 mL Nebulization TID  . levofloxacin (LEVAQUIN) IV  750 mg Intravenous Q48H  . linagliptin  5 mg Oral Daily   And  . pioglitazone  15 mg Oral Daily  . methylPREDNISolone (SOLU-MEDROL) injection  40 mg Intravenous Q6H  . mometasone-formoterol  2 puff Inhalation BID  . montelukast  10 mg Oral Daily      Physical Exam: Vital signs in last 24 hours: Temp:  [97.4 F (36.3 C)-98.1 F (36.7 C)] 97.4 F (36.3 C) (03/09 0612) Pulse Rate:  [66-76] 66 (03/09 0612) Resp:  [20] 20 (03/09 0612) BP: (98-112)/(48-59) 112/59 mmHg (03/09 0612) SpO2:  [97 %-100 %] 100 % (03/09 0654) Weight:  [416 lb 4.8 oz (188.832 kg)] 416 lb 4.8 oz (188.832 kg) (03/08 1253) Weight change:  Last BM Date: 05/05/14  Intake/Output from previous day: 03/08 0701 - 03/09 0700 In: 1073.8 [P.O.:240; I.V.:833.8] Out: 800 [Urine:800]     Physical Exam: General- pt is awake,alert, oriented to time place and person Resp- No acute REsp distress,Decreased bs at bases CVS- S1S2 regular in rate and rhythm GIT- BS+, soft, NT, ND, morbidly obese EXT- NO LE Edema, No Cyanosis          Chronic venous stasis changes +  Lab  Results: CBC  Recent Labs  05/05/14 0642  WBC 13.7*  HGB 11.3*  HCT 38.1  PLT 209    BMET  Recent Labs  05/06/14 0624 05/07/14 0556  NA 138 136  K 5.7* 5.6*  CL 104 105  CO2 27 26  GLUCOSE 203* 178*  BUN 77* 87*  CREATININE 2.82* 3.02*  CALCIUM 8.4 8.2*   Trend  Creat  2016 1.79=>2.82=>3.02 2015  0.9--1.99  Potassium 6.3=>5.7=>5.6  MICRO Recent Results (from the past 240 hour(s))  Culture, Urine     Status: None   Collection Time: 05/05/14 12:42 AM  Result Value Ref Range Status   Specimen Description URINE, CLEAN CATCH  Final   Special Requests NONE  Final   Colony Count   Final    >=100,000 COLONIES/ML Performed at [facility]    Culture   Final    Multiple bacterial morphotypes present, none predominant. Suggest appropriate recollection if clinically indicated. Performed at [facility]    Report Status 05/06/2014 FINAL  Final      Lab Results  Component Value Date   CALCIUM 8.2* 05/07/2014   CAION 1.21 02/06/2014    FENA =0.22% Urine na 25 Urine Creat 232 Se Sodium 138 Se Creat 2.82   Impression: 1)Renal  AKI secondary to Prerenal/ATN  AKI sec to NSAIDS + ARB                 AKI now worseing                AKI on ? 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               CKD since not sure               CKD secondary to Long term NSAID use / DM/HTN/Obesity related/? Low glomerular mass                Progression of CKD marked with AKI                Proteinura Launa Flight .          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  5)ID- UTI On Levaquin Primary MD following  6)Electrolytes Hyperkalemic     better NOrmonatremic   7)Acid base Co2 at  goal     Plan:  Will lower coreg dose Will suggest to lower lasix dose Will suggest to get Iv line as pt lost line last night. Will suggest to give kayexalate for hyperkalemia Will suggest ct eval to make sure of pt has only 1 kidney   Addendum Pt CT scan shows two kidneys. NO hydronephrosis.   8) ID- possible pneumonia     On levofloxacin      Aarna Mihalko S 05/07/2014, 8:51 AM

## 2014-05-07 NOTE — Progress Notes (Signed)
Peripherally Inserted Central Catheter/Midline Placement  The IV Nurse has discussed with the patient and/or persons authorized to consent for the patient, the purpose of this procedure and the potential benefits and risks involved with this procedure.  The benefits include less needle sticks, lab draws from the catheter and patient may be discharged home with the catheter.  Risks include, but not limited to, infection, bleeding, blood clot (thrombus formation), and puncture of an artery; nerve damage and irregular heat beat.  Alternatives to this procedure were also discussed.  PICC/Midline Placement Documentation  PICC / Midline Single Lumen [id number] PICC Right Basilic 51 cm 0 cm (Active)  Indication for Insertion or Continuance of Line Limited venous access - need for IV therapy >5 days (PICC only) 05/07/2014  1:17 PM  Exposed Catheter (cm) 0 cm 05/07/2014  1:17 PM  Site Assessment Clean;Dry;Intact 05/07/2014  1:17 PM  Line Status Flushed;Blood return noted;Capped (central line) 05/07/2014  1:17 PM  Dressing Type Transparent;Securing device 05/07/2014  1:17 PM  Dressing Status Clean;Dry;Intact;Antimicrobial disc in place 05/07/2014  1:17 PM  Line Care Connections checked and tightened 05/07/2014  1:17 PM  Dressing Intervention New dressing 05/07/2014  1:17 PM  Dressing Change Due 05/14/14 05/07/2014  1:17 PM       Deontrey Massi, Essie Hart 05/07/2014, 1:24 PM

## 2014-05-07 NOTE — Progress Notes (Signed)
  Echocardiogram 2D Echocardiogram has been performed.  Jospeh Mangel 05/07/2014, 11:22 AM

## 2014-05-07 NOTE — Progress Notes (Signed)
[facility] PROGRESS NOTE  [name] [id number] DOB: October 10, 1965 DOA: 05/03/2014 PCP: [name], MD  Assessment/Plan: Asthma with Acute Exacerbation/Acute Hypoxemic Respiratory Failure - BS with improved air flow. sats continue to improve. Continue tappering steroids. continue levaquin day #3. Scheduled duonebs.  Concern acute respiratory failure more related to cardiac etiology. await echo. Continue lasix. Daily weights. Intake and output  Morbid Obesity: BMI 65.4. Nutritional consult  Acute on CKD Stage III -Baseline Cr around 1.0-1.99. Creatinine continues to trend upward. Evaluated by nephrology who recommend IV fluids and lasix.  Urine output unimpressive. Some issues with retention. [place] with left kidney not visualized. No hydronephrosis on right. Will request CT to evaluate left kidney. Will obtain PICC line.   HTN -somewhat soft. Will decrease coreg and lasix for now. Will resume lasix dose until PICC and fluids resumed. monitor  DM II -CBGs range 155-241. Steroids contributing and will continue taper. Healthy appetite as well. A1c 10.9. Metformin on hold.  Hyperkalemia -related to #2. Kayexalate x2. Improved but remains elevated. Will repeat kayexalate. recheck   Hyperlipidemia 1. -Continue statin  Code Status: full Family Communication:  Disposition Plan: home when ready   Consultants:  nephrology  Procedures:    Antibiotics:  levaquin 05/05/14>>  HPI/Subjective: Sitting on bench. Reports some improvement  Objective: Filed Vitals:   05/07/14 0612  BP: 112/59  Pulse: 66  Temp: 97.4 F (36.3 C)  Resp: 20    Intake/Output Summary (Last 24 hours) at 05/07/14 0935 Last data filed at 05/07/14 0500  Gross per 24 hour  Intake 1073.75 ml  Output    800 ml  Net 273.75 ml   Filed Weights   05/04/14 0530 05/05/14 0647 05/06/14 1253  Weight: 183.5 kg (404 lb 8.7 oz) 183.9 kg (405 lb 6.8 oz) 188.832 kg (416 lb 4.8 oz)     Exam:   General:  Morbidly obese appears comfortable  Cardiovascular: HS distant RRR no m/g/r no LE edema  Respiratory: normal effort BS distant but improved air flow  Abdomen: morbidly obese soft non-tender +BS  Musculoskeletal: no clubbing or cyanosis   Data Reviewed: Basic Metabolic Panel:  Recent Labs Lab 05/03/14 1406 05/05/14 0642 05/05/14 1652 05/06/14 0624 05/07/14 0556  NA 139 136 138 138 136  K 5.0 6.3* 5.5* 5.7* 5.6*  CL 107 105 105 104 105  CO2 29 26 27 27 26   GLUCOSE 140* 228* 203* 203* 178*  BUN 41* 67* 70* 77* 87*  CREATININE 1.79* 2.59* 2.69* 2.82* 3.02*  CALCIUM 8.7 8.4 8.4 8.4 8.2*   Liver Function Tests: No results for input(s): AST, ALT, ALKPHOS, BILITOT, PROT, ALBUMIN in the last 168 hours. No results for input(s): LIPASE, AMYLASE in the last 168 hours. No results for input(s): AMMONIA in the last 168 hours. CBC:  Recent Labs Lab 05/03/14 1406 05/05/14 0642  WBC 10.7* 13.7*  NEUTROABS 6.6  --   HGB 11.2* 11.3*  HCT 37.0 38.1  MCV 86.4 87.4  PLT 187 209   Cardiac Enzymes: No results for input(s): CKTOTAL, CKMB, CKMBINDEX, TROPONINI in the last 168 hours. BNP (last 3 results)  Recent Labs  05/03/14 1406  BNP 122.0*    ProBNP (last 3 results) No results for input(s): PROBNP in the last 8760 hours.  CBG:  Recent Labs Lab 05/06/14 0804 05/06/14 1155 05/06/14 1646 05/06/14 2224 05/07/14 0743  GLUCAP 200* 224* 211* 152* 155*    Recent Results (from the past 240 hour(s))  Culture, Urine  Status: None   Collection Time: 05/05/14 12:42 AM  Result Value Ref Range Status   Specimen Description URINE, CLEAN CATCH  Final   Special Requests NONE  Final   Colony Count   Final    >=100,000 COLONIES/ML Performed at [facility]    Culture   Final    Multiple bacterial morphotypes present, none predominant. Suggest appropriate recollection if clinically indicated. Performed at [facility]    Report  Status 05/06/2014 FINAL  Final     Studies: US Renal  05/06/2014   CLINICAL DATA:  Acute renal failure  EXAM: RENAL/URINARY TRACT ULTRASOUND COMPLETE  COMPARISON:  None.  FINDINGS: Right Kidney:  Length: 11.6 cm.  No mass or hydronephrosis.  Left Kidney:  Not discretely visualized.  Bladder:  Within normal limits.  IMPRESSION: Right kidney and bladder are within normal limits.  No hydronephrosis.  Left kidney is not discretely visualized.   Electronically Signed   By: Julian Hy M.D.   On: 05/06/2014 19:05    Scheduled Meds: . aspirin EC  81 mg Oral Daily  . atorvastatin  20 mg Oral QHS  . carvedilol  3.125 mg Oral BID  . enoxaparin (LOVENOX) injection  80 mg Subcutaneous Q24H  . furosemide  40 mg Intravenous BID  . gabapentin  300 mg Oral TID  . guaiFENesin  1,200 mg Oral BID  . insulin aspart  0-20 Units Subcutaneous TID WC  . insulin aspart  0-5 Units Subcutaneous QHS  . insulin aspart protamine- aspart  56 Units Subcutaneous BID WC  . ipratropium-albuterol  3 mL Nebulization TID  . levofloxacin (LEVAQUIN) IV  750 mg Intravenous Q48H  . linagliptin  5 mg Oral Daily   And  . pioglitazone  15 mg Oral Daily  . methylPREDNISolone (SOLU-MEDROL) injection  40 mg Intravenous Q12H  . mometasone-formoterol  2 puff Inhalation BID  . montelukast  10 mg Oral Daily   Continuous Infusions: . sodium chloride 75 mL/hr at 05/06/14 0539    Principal Problem:   Asthma exacerbation Active Problems:   ARF (acute renal failure)   Acute respiratory failure with hypoxia   Diabetes mellitus type 2 in obese   Hypertension   Hyperlipidemia   Morbid obesity   CKD (chronic kidney disease) stage 3, GFR 30-59 ml/min   Acute renal failure    Time spent: [place] Pager [phone]. If 7PM-7AM, please contact night-coverage at [email or web address], password [email or web address] 05/07/2014, 9:35 AM  LOS: 3 days

## 2014-05-07 NOTE — Progress Notes (Signed)
Inpatient Diabetes Program Recommendations  AACE/ADA: New Consensus Statement on Inpatient Glycemic Control (2013)  Target Ranges:  Prepandial:   less than 140 mg/dL      Peak postprandial:   less than 180 mg/dL (1-2 hours)      Critically ill patients:  140 - 180 mg/dL  Results for [name], [name] (MRN [id number]) as of 05/07/2014 07:33  Ref. Range 05/07/2014 05:56  Glucose Latest Range: 70-99 mg/dL 178 (H)   Results for [name], [name] (MRN [id number]) as of 05/07/2014 07:33  Ref. Range 05/06/2014 08:04 05/06/2014 11:55 05/06/2014 16:46 05/06/2014 22:24  Glucose-Capillary Latest Range: 70-99 mg/dL 200 (H) 224 (H) 211 (H) 152 (H)    Diabetes history: DM2 Outpatient Diabetes medications: 70/30 48 units BID, Metformin 1000 mg BID, Oseni 25-15 mg daily Current orders for Inpatient glycemic control: 70/30 56 units BID, Novolog 0-20 units TID, Novolog 0-5 units HS, Tradjenta 5 mg daily, Actos 15 mg daily  Inpatient Diabetes Program Recommendations Insulin - Basal: If steroids are continued, please consider increasing 70/30 to 60 units BID.  Thanks, Barnie Alderman, RN, MSN, CCRN, CDE Diabetes Coordinator Inpatient Diabetes Program [phone] (Team Pager) [phone] ([facility] office) [phone] [facility] office)

## 2014-05-08 LAB — CBC
HCT: 40.7 % (ref 36.0–46.0)
Hemoglobin: 12.1 g/dL (ref 12.0–15.0)
MCH: 26.1 pg (ref 26.0–34.0)
MCHC: 29.7 g/dL — ABNORMAL LOW (ref 30.0–36.0)
MCV: 87.7 fL (ref 78.0–100.0)
PLATELETS: 194 10*3/uL (ref 150–400)
RBC: 4.64 MIL/uL (ref 3.87–5.11)
RDW: 15.2 % (ref 11.5–15.5)
WBC: 12.7 10*3/uL — ABNORMAL HIGH (ref 4.0–10.5)

## 2014-05-08 LAB — GLUCOSE, CAPILLARY
GLUCOSE-CAPILLARY: 156 mg/dL — AB (ref 70–99)
GLUCOSE-CAPILLARY: 117 mg/dL — AB (ref 70–99)
Glucose-Capillary: 141 mg/dL — ABNORMAL HIGH (ref 70–99)
Glucose-Capillary: 90 mg/dL (ref 70–99)

## 2014-05-08 LAB — BASIC METABOLIC PANEL
Anion gap: 7 (ref 5–15)
BUN: 94 mg/dL — AB (ref 6–23)
CO2: 27 mmol/L (ref 19–32)
Calcium: 8.1 mg/dL — ABNORMAL LOW (ref 8.4–10.5)
Chloride: 103 mmol/L (ref 96–112)
Creatinine, Ser: 2.93 mg/dL — ABNORMAL HIGH (ref 0.50–1.10)
GFR, EST AFRICAN AMERICAN: 21 mL/min — AB (ref >90)
GFR, EST NON AFRICAN AMERICAN: 18 mL/min — AB (ref >90)
Glucose, Bld: 153 mg/dL — ABNORMAL HIGH (ref 70–99)
Potassium: 4.9 mmol/L (ref 3.5–5.1)
Sodium: 137 mmol/L (ref 135–145)

## 2014-05-08 MED ORDER — AMLODIPINE BESYLATE 5 MG PO TABS: 5.0000 mg | ORAL_TABLET | Freq: Every day | ORAL | Status: DC

## 2014-05-08 NOTE — Progress Notes (Signed)
UR chart review completed.  

## 2014-05-08 NOTE — Progress Notes (Signed)
[facility] PROGRESS NOTE  KIPPY CRUME [id number] DOB: 06-Jun-1965 DOA: 05/03/2014 PCP: [name], MD  Assessment/Plan: Asthma with Acute Exacerbation/Acute Hypoxemic Respiratory Failure - BS with improved air flow. sats continue to improve. Continue tappering steroids. continue levaquin day #4. Scheduled duonebs.  Echo shows preserved ejection fraction with diastolic dysfunction. Continue lasix. Daily weights. Intake and output  Morbid Obesity: BMI 65.4. Nutritional consult  Acute on CKD Stage III -Baseline Cr around 1.0-1.99. Creatinine mildly improved today. Evaluated by nephrology who recommend IV fluids and lasix.  Urine output unimpressive. Some issues with retention. [place] with left kidney not visualized. No hydronephrosis on right. Continue current treatments  HTN -Stable  DM II - Steroids contributing and will continue taper. Healthy appetite as well. A1c 10.9. Metformin on hold.  Hyperkalemia Improved with Kayexalate   Hyperlipidemia 1. -Continue statin  Code Status: full Family Communication:  Disposition Plan: home when ready   Consultants:  nephrology  Procedures:    Antibiotics:  levaquin 05/05/14>>  HPI/Subjective: Sitting up in bed. Feels breathing is improving. Was able to ambulate today.  Objective: Filed Vitals:   05/08/14 1459  BP: 129/54  Pulse: 69  Temp: 97.7 F (36.5 C)  Resp: 20    Intake/Output Summary (Last 24 hours) at 05/08/14 1858 Last data filed at 05/08/14 1815  Gross per 24 hour  Intake 2463.75 ml  Output      0 ml  Net 2463.75 ml   Filed Weights   05/05/14 0647 05/06/14 1253 05/07/14 1050  Weight: 183.9 kg (405 lb 6.8 oz) 188.832 kg (416 lb 4.8 oz) 190.284 kg (419 lb 8 oz)    Exam:   General:  Morbidly obese appears comfortable  Cardiovascular: HS distant RRR no m/g/r no LE edema  Respiratory: normal effort BS distant but improved air flow  Abdomen: morbidly obese soft non-tender  +BS  Musculoskeletal: no clubbing or cyanosis   Data Reviewed: Basic Metabolic Panel:  Recent Labs Lab 05/05/14 0642 05/05/14 1652 05/06/14 0624 05/07/14 0556 05/08/14 0624  NA 136 138 138 136 137  K 6.3* 5.5* 5.7* 5.6* 4.9  CL 105 105 104 105 103  CO2 26 27 27 26 27   GLUCOSE 228* 203* 203* 178* 153*  BUN 67* 70* 77* 87* 94*  CREATININE 2.59* 2.69* 2.82* 3.02* 2.93*  CALCIUM 8.4 8.4 8.4 8.2* 8.1*   Liver Function Tests: No results for input(s): AST, ALT, ALKPHOS, BILITOT, PROT, ALBUMIN in the last 168 hours. No results for input(s): LIPASE, AMYLASE in the last 168 hours. No results for input(s): AMMONIA in the last 168 hours. CBC:  Recent Labs Lab 05/03/14 1406 05/05/14 0642 05/08/14 0624  WBC 10.7* 13.7* 12.7*  NEUTROABS 6.6  --   --   HGB 11.2* 11.3* 12.1  HCT 37.0 38.1 40.7  MCV 86.4 87.4 87.7  PLT 187 209 194   Cardiac Enzymes: No results for input(s): CKTOTAL, CKMB, CKMBINDEX, TROPONINI in the last 168 hours. BNP (last 3 results)  Recent Labs  05/03/14 1406  BNP 122.0*    ProBNP (last 3 results) No results for input(s): PROBNP in the last 8760 hours.  CBG:  Recent Labs Lab 05/07/14 1637 05/07/14 2122 05/08/14 0738 05/08/14 1156 05/08/14 1706  GLUCAP 179* 232* 141* 156* 117*    Recent Results (from the past 240 hour(s))  Culture, Urine     Status: None   Collection Time: 05/05/14 12:42 AM  Result Value Ref Range Status   Specimen Description URINE, CLEAN CATCH  Final  Special Requests NONE  Final   Colony Count   Final    >=100,000 COLONIES/ML Performed at [facility]    Culture   Final    Multiple bacterial morphotypes present, none predominant. Suggest appropriate recollection if clinically indicated. Performed at [facility]    Report Status 05/06/2014 FINAL  Final     Studies: Ct Abdomen Pelvis Wo Contrast  05/07/2014   CLINICAL DATA:  49 year old female with lower abdominal pain for 2 weeks. Acute renal  failure. Morbid obesity. Initial encounter.  EXAM: CT ABDOMEN AND PELVIS WITHOUT CONTRAST  TECHNIQUE: Multidetector CT imaging of the abdomen and pelvis was performed following the standard protocol without IV contrast.  COMPARISON:  Renal ultrasound 05/06/2014.  FINDINGS: Large body habitus degrading image quality.  Cardiomegaly. Small pericardial effusion measuring up to 12 mm in thickness (series 2, image 3). Multifocal bilateral lower lobe streaky opacity with some areas of early consolidation. Trace bilateral pleural effusions.  Disc and facet degeneration in the spine. No acute osseous abnormality identified.  No pelvic free fluid.  Negative non contrast uterus and adnexa.  Retained stool in the rectum. Less retained stool in the distal colon. Oral contrast has reached the distal colon. Retained stool mixed with contrast from the left colon to the cecum. No dilated small bowel. Stomach and duodenum appear to be negative.  Grossly negative non contrast appearance of the liver, gallbladder, spleen, pancreas and adrenal glands.  No hydronephrosis. No nephrolithiasis. Limited anatomic detail of the ureters, but no strong evidence of hydroureter. The bladder is mildly distended but otherwise unremarkable. No abdominal free fluid identified. Mild Aortoiliac calcified atherosclerosis noted.  Widespread subcutaneous fat stranding of the body wall and panniculus.  IMPRESSION: 1. Image quality degraded by a large body habitus. 2. Cardiomegaly with small pericardial effusion. 3. Trace pleural effusions and bilateral lower lobe atelectasis versus pneumonia. 4. Body wall edema, nonspecific but may relate to anasarca. 5. No acute abdominal visceral finding is evident. No hydronephrosis.   Electronically Signed   By: Genevie Ann M.D.   On: 05/07/2014 16:28   Dg Chest Port 1 View  05/07/2014   CLINICAL DATA:  PICC placement, initial encounter.  EXAM: PORTABLE CHEST - 1 VIEW  COMPARISON:  05/03/2014.  FINDINGS: Trachea is  midline. Heart is enlarged. Right PICC tip projects at the SVC RA junction or high right atrium. Image quality is degraded by large body habitus. Lungs are low in volume. No pleural fluid.  IMPRESSION: Low lung volumes.   Electronically Signed   By: Lorin Picket M.D.   On: 05/07/2014 13:59    Scheduled Meds: . aspirin EC  81 mg Oral Daily  . atorvastatin  20 mg Oral QHS  . carvedilol  3.125 mg Oral BID  . enoxaparin (LOVENOX) injection  80 mg Subcutaneous Q24H  . furosemide  40 mg Intravenous BID  . gabapentin  300 mg Oral TID  . guaiFENesin  1,200 mg Oral BID  . insulin aspart  0-20 Units Subcutaneous TID WC  . insulin aspart  0-5 Units Subcutaneous QHS  . insulin aspart protamine- aspart  56 Units Subcutaneous BID WC  . ipratropium-albuterol  3 mL Nebulization TID  . levofloxacin (LEVAQUIN) IV  750 mg Intravenous Q48H  . linagliptin  5 mg Oral Daily   And  . pioglitazone  15 mg Oral Daily  . methylPREDNISolone (SOLU-MEDROL) injection  40 mg Intravenous Q12H  . mometasone-formoterol  2 puff Inhalation BID  . montelukast  10  mg Oral Daily  . sodium chloride  10-40 mL Intracatheter Q12H   Continuous Infusions: . sodium chloride 75 mL/hr at 05/06/14 0539    Principal Problem:   Asthma exacerbation Active Problems:   ARF (acute renal failure)   Acute respiratory failure with hypoxia   Diabetes mellitus type 2 in obese   Hypertension   Hyperlipidemia   Morbid obesity   CKD (chronic kidney disease) stage 3, GFR 30-59 ml/min   Acute renal failure    Time spent: [place] [facility] Pager [phone]. If 7PM-7AM, please contact night-coverage at [email or web address], password [facility] 05/08/2014, 6:58 PM  LOS: 4 days

## 2014-05-08 NOTE — Progress Notes (Signed)
Subjective: Interval History: has no complaint of nausea or vomiting. She states that her breathing is much better. She denies any difficulty breathing. She has still some cough but no sputum production..  Objective: Vital signs in last 24 hours: Temp:  [97.8 F (36.6 C)-98 F (36.7 C)] 97.8 F (36.6 C) (03/10 0700) Pulse Rate:  [69-70] 70 (03/10 0700) Resp:  [15-20] 15 (03/10 0700) BP: (104-123)/(52-58) 123/58 mmHg (03/10 0700) SpO2:  [93 %-100 %] 96 % (03/10 0700) Weight:  [190.284 kg (419 lb 8 oz)] 190.284 kg (419 lb 8 oz) (03/09 1050) Weight change: 1.452 kg (3 lb 3.2 oz)  Intake/Output from previous day: 03/09 0701 - 03/10 0700 In: 897.5 [I.V.:897.5] Out: -  Intake/Output this shift:    General appearance: alert, cooperative and no distress Resp: diminished breath sounds bilaterally Cardio: regular rate and rhythm, S1, S2 normal, no murmur, click, rub or gallop GI: soft, non-tender; bowel sounds normal; no masses,  no organomegaly Extremities: edema 1-2+ edema  Lab Results:  Recent Labs  05/08/14 0624  WBC 12.7*  HGB 12.1  HCT 40.7  PLT 194   BMET:  Recent Labs  05/07/14 0556 05/08/14 0624  NA 136 137  K 5.6* 4.9  CL 105 103  CO2 26 27  GLUCOSE 178* 153*  BUN 87* 94*  CREATININE 3.02* 2.93*  CALCIUM 8.2* 8.1*   No results for input(s): PTH in the last 72 hours. Iron Studies: No results for input(s): IRON, TIBC, TRANSFERRIN, FERRITIN in the last 72 hours.  Studies/Results: Ct Abdomen Pelvis Wo Contrast  05/07/2014   CLINICAL DATA:  49 year old female with lower abdominal pain for 2 weeks. Acute renal failure. Morbid obesity. Initial encounter.  EXAM: CT ABDOMEN AND PELVIS WITHOUT CONTRAST  TECHNIQUE: Multidetector CT imaging of the abdomen and pelvis was performed following the standard protocol without IV contrast.  COMPARISON:  Renal ultrasound 05/06/2014.  FINDINGS: Large body habitus degrading image quality.  Cardiomegaly. Small pericardial effusion  measuring up to 12 mm in thickness (series 2, image 3). Multifocal bilateral lower lobe streaky opacity with some areas of early consolidation. Trace bilateral pleural effusions.  Disc and facet degeneration in the spine. No acute osseous abnormality identified.  No pelvic free fluid.  Negative non contrast uterus and adnexa.  Retained stool in the rectum. Less retained stool in the distal colon. Oral contrast has reached the distal colon. Retained stool mixed with contrast from the left colon to the cecum. No dilated small bowel. Stomach and duodenum appear to be negative.  Grossly negative non contrast appearance of the liver, gallbladder, spleen, pancreas and adrenal glands.  No hydronephrosis. No nephrolithiasis. Limited anatomic detail of the ureters, but no strong evidence of hydroureter. The bladder is mildly distended but otherwise unremarkable. No abdominal free fluid identified. Mild Aortoiliac calcified atherosclerosis noted.  Widespread subcutaneous fat stranding of the body wall and panniculus.  IMPRESSION: 1. Image quality degraded by a large body habitus. 2. Cardiomegaly with small pericardial effusion. 3. Trace pleural effusions and bilateral lower lobe atelectasis versus pneumonia. 4. Body wall edema, nonspecific but may relate to anasarca. 5. No acute abdominal visceral finding is evident. No hydronephrosis.   Electronically Signed   By: Genevie Ann M.D.   On: 05/07/2014 16:28   US Renal  05/06/2014   CLINICAL DATA:  Acute renal failure  EXAM: RENAL/URINARY TRACT ULTRASOUND COMPLETE  COMPARISON:  None.  FINDINGS: Right Kidney:  Length: 11.6 cm.  No mass or hydronephrosis.  Left Kidney:  Not  discretely visualized.  Bladder:  Within normal limits.  IMPRESSION: Right kidney and bladder are within normal limits.  No hydronephrosis.  Left kidney is not discretely visualized.   Electronically Signed   By: Julian Hy M.D.   On: 05/06/2014 19:05   Dg Chest Port 1 View  05/07/2014   CLINICAL DATA:   PICC placement, initial encounter.  EXAM: PORTABLE CHEST - 1 VIEW  COMPARISON:  05/03/2014.  FINDINGS: Trachea is midline. Heart is enlarged. Right PICC tip projects at the SVC RA junction or high right atrium. Image quality is degraded by large body habitus. Lungs are low in volume. No pleural fluid.  IMPRESSION: Low lung volumes.   Electronically Signed   By: Lorin Picket M.D.   On: 05/07/2014 13:59    I have reviewed the patient's current medications.  Assessment/Plan: Problem #1 renal failure: Possibly acute on chronic. Presently her BUN and creatinine seems to be improving. Patient is nonoliguric but she states that her urine output is colllected very well. Problem #2 exacerbation of her asthma: Patient seems to be feeling better Problem #3 hyperkalemia: Her potassium has corrected Problem #4 hypertension: Her blood pressure is reasonably controlled Problem #5 morbid obesity Problem #6 leg edema/anasarca etiology at this moment no clear Problem #7 diabetes Problem #8 possible chronic renal failure. Patient seems to have acute kidney injury in 2015 but her creatinine has improved to 1 recently. Ultrasound showed right kidney to be 11.6 but left one is not visualized. This could be multifactorial includingNSAID/diabetes/hypertension/obesity related Plan: We'll continue his diuretics We'll check her basic metabolic panel in the morning   LOS: 4 days   Earnie Rockhold S 05/08/2014,10:20 AM

## 2014-05-09 LAB — COMPREHENSIVE METABOLIC PANEL
ALT: 33 U/L (ref 0–35)
AST: 16 U/L (ref 0–37)
Albumin: 2.9 g/dL — ABNORMAL LOW (ref 3.5–5.2)
Alkaline Phosphatase: 78 U/L (ref 39–117)
Anion gap: 5 (ref 5–15)
BUN: 95 mg/dL — ABNORMAL HIGH (ref 6–23)
CALCIUM: 8.4 mg/dL (ref 8.4–10.5)
CO2: 28 mmol/L (ref 19–32)
CREATININE: 2.27 mg/dL — AB (ref 0.50–1.10)
Chloride: 105 mmol/L (ref 96–112)
GFR calc non Af Amer: 25 mL/min — ABNORMAL LOW (ref >90)
GFR, EST AFRICAN AMERICAN: 28 mL/min — AB (ref >90)
GLUCOSE: 122 mg/dL — AB (ref 70–99)
Potassium: 4.8 mmol/L (ref 3.5–5.1)
Sodium: 138 mmol/L (ref 135–145)
TOTAL PROTEIN: 6.5 g/dL (ref 6.0–8.3)
Total Bilirubin: 0.6 mg/dL (ref 0.3–1.2)

## 2014-05-09 LAB — GLUCOSE, CAPILLARY
GLUCOSE-CAPILLARY: 154 mg/dL — AB (ref 70–99)
Glucose-Capillary: 43 mg/dL — CL (ref 70–99)
Glucose-Capillary: 106 mg/dL — ABNORMAL HIGH (ref 70–99)
Glucose-Capillary: 112 mg/dL — ABNORMAL HIGH (ref 70–99)
Glucose-Capillary: 199 mg/dL — ABNORMAL HIGH (ref 70–99)

## 2014-05-09 MED ORDER — INSULIN ASPART PROT & ASPART (70-30 MIX) 100 UNIT/ML ~~LOC~~ SUSP
45.0000 [IU] | Freq: Two times a day (BID) | SUBCUTANEOUS | Status: DC
  Administered 2014-05-09 – 2014-05-11 (×5): 45 [IU] via SUBCUTANEOUS

## 2014-05-09 MED ORDER — GLUCOSE 40 % PO GEL
ORAL | Status: AC
  Administered 2014-05-09: 37.5 g
  Filled 2014-05-09: qty 1

## 2014-05-09 MED ORDER — FUROSEMIDE 10 MG/ML IJ SOLN
80.0000 mg | Freq: Two times a day (BID) | INTRAMUSCULAR | Status: DC
  Administered 2014-05-09: 80 mg via INTRAVENOUS
  Filled 2014-05-09: qty 8

## 2014-05-09 MED ORDER — PREDNISONE 20 MG PO TABS
60.0000 mg | ORAL_TABLET | Freq: Every day | ORAL | Status: DC
  Administered 2014-05-10 – 2014-05-11 (×2): 60 mg via ORAL
  Filled 2014-05-09 (×2): qty 3

## 2014-05-09 MED ORDER — IPRATROPIUM-ALBUTEROL 0.5-2.5 (3) MG/3ML IN SOLN
3.0000 mL | Freq: Two times a day (BID) | RESPIRATORY_TRACT | Status: DC
  Administered 2014-05-09 – 2014-05-11 (×4): 3 mL via RESPIRATORY_TRACT
  Filled 2014-05-09 (×5): qty 3

## 2014-05-09 MED ORDER — LEVOFLOXACIN 750 MG PO TABS
750.0000 mg | ORAL_TABLET | ORAL | Status: AC
  Administered 2014-05-09 – 2014-05-11 (×2): 750 mg via ORAL
  Filled 2014-05-09 (×2): qty 1

## 2014-05-09 NOTE — Progress Notes (Signed)
[facility] PROGRESS NOTE  [name] [id number] DOB: 08-17-1965 DOA: 05/03/2014 PCP: [name], MD  Assessment/Plan: Asthma with Acute Exacerbation/Acute Hypoxemic Respiratory Failure - BS with improved air flow. sats continue to improve. Change Solu-Medrol to prednisone taper. continue levaquin day #5. Scheduled duonebs.  Echo shows preserved ejection fraction with diastolic dysfunction. Continue lasix. Daily weights. Intake and output  Morbid Obesity: BMI 65.4. Nutritional consult  Acute on CKD Stage III -Baseline Cr around 1.0-1.99. Creatinine mildly improved today. Evaluated by nephrology who recommend IV fluids and lasix.  Renal function appears to be improving. Continue current treatments  HTN -Stable  DM II - Steroids contributing and will continue taper. Healthy appetite as well. A1c 10.9. Metformin on hold.  Hyperkalemia Improved with Kayexalate   Hyperlipidemia 1. -Continue statin  Code Status: full Family Communication:  Disposition Plan: home when ready   Consultants:  nephrology  Procedures:    Antibiotics:  levaquin 05/05/14>>  HPI/Subjective: Feels better today. Feels breathing is improving.  Objective: Filed Vitals:   05/09/14 1332  BP: 130/64  Pulse: 69  Temp: 98.6 F (37 C)  Resp: 18    Intake/Output Summary (Last 24 hours) at 05/09/14 1941 Last data filed at 05/09/14 1830  Gross per 24 hour  Intake 2245.83 ml  Output    200 ml  Net 2045.83 ml   Filed Weights   05/05/14 0647 05/06/14 1253 05/07/14 1050  Weight: 183.9 kg (405 lb 6.8 oz) 188.832 kg (416 lb 4.8 oz) 190.284 kg (419 lb 8 oz)    Exam:   General:  Morbidly obese appears comfortable  Cardiovascular: HS distant RRR no m/g/r no LE edema  Respiratory: normal effort BS distant but improved air flow  Abdomen: morbidly obese soft non-tender +BS  Musculoskeletal: no clubbing or cyanosis   Data Reviewed: Basic Metabolic Panel:  Recent  Labs Lab 05/05/14 1652 05/06/14 0624 05/07/14 0556 05/08/14 0624 05/09/14 0951  NA 138 138 136 137 138  K 5.5* 5.7* 5.6* 4.9 4.8  CL 105 104 105 103 105  CO2 27 27 26 27 28   GLUCOSE 203* 203* 178* 153* 122*  BUN 70* 77* 87* 94* 95*  CREATININE 2.69* 2.82* 3.02* 2.93* 2.27*  CALCIUM 8.4 8.4 8.2* 8.1* 8.4   Liver Function Tests:  Recent Labs Lab 05/09/14 0951  AST 16  ALT 33  ALKPHOS 78  BILITOT 0.6  PROT 6.5  ALBUMIN 2.9*   No results for input(s): LIPASE, AMYLASE in the last 168 hours. No results for input(s): AMMONIA in the last 168 hours. CBC:  Recent Labs Lab 05/03/14 1406 05/05/14 0642 05/08/14 0624  WBC 10.7* 13.7* 12.7*  NEUTROABS 6.6  --   --   HGB 11.2* 11.3* 12.1  HCT 37.0 38.1 40.7  MCV 86.4 87.4 87.7  PLT 187 209 194   Cardiac Enzymes: No results for input(s): CKTOTAL, CKMB, CKMBINDEX, TROPONINI in the last 168 hours. BNP (last 3 results)  Recent Labs  05/03/14 1406  BNP 122.0*    ProBNP (last 3 results) No results for input(s): PROBNP in the last 8760 hours.  CBG:  Recent Labs Lab 05/08/14 2147 05/09/14 0725 05/09/14 0752 05/09/14 1215 05/09/14 1652  GLUCAP 90 43* 106* 112* 199*    Recent Results (from the past 240 hour(s))  Culture, Urine     Status: None   Collection Time: 05/05/14 12:42 AM  Result Value Ref Range Status   Specimen Description URINE, CLEAN CATCH  Final   Special Requests NONE  Final   Colony Count   Final    >=100,000 COLONIES/ML Performed at [facility]    Culture   Final    Multiple bacterial morphotypes present, none predominant. Suggest appropriate recollection if clinically indicated. Performed at [facility]    Report Status 05/06/2014 FINAL  Final     Studies: No results found.  Scheduled Meds: . aspirin EC  81 mg Oral Daily  . atorvastatin  20 mg Oral QHS  . carvedilol  3.125 mg Oral BID  . enoxaparin (LOVENOX) injection  80 mg Subcutaneous Q24H  . furosemide  80 mg  Intravenous BID  . gabapentin  300 mg Oral TID  . guaiFENesin  1,200 mg Oral BID  . insulin aspart  0-20 Units Subcutaneous TID WC  . insulin aspart  0-5 Units Subcutaneous QHS  . insulin aspart protamine- aspart  45 Units Subcutaneous BID WC  . ipratropium-albuterol  3 mL Nebulization BID  . levofloxacin  750 mg Oral Q48H  . linagliptin  5 mg Oral Daily   And  . pioglitazone  15 mg Oral Daily  . methylPREDNISolone (SOLU-MEDROL) injection  40 mg Intravenous Q12H  . mometasone-formoterol  2 puff Inhalation BID  . montelukast  10 mg Oral Daily  . sodium chloride  10-40 mL Intracatheter Q12H   Continuous Infusions: . sodium chloride 50 mL/hr at 05/09/14 1135    Principal Problem:   Asthma exacerbation Active Problems:   ARF (acute renal failure)   Acute respiratory failure with hypoxia   Diabetes mellitus type 2 in obese   Hypertension   Hyperlipidemia   Morbid obesity   CKD (chronic kidney disease) stage 3, GFR 30-59 ml/min   Acute renal failure    Time spent: [facility] Pager [phone]. If 7PM-7AM, please contact night-coverage at [email or web address], password [facility] 05/09/2014, 7:41 PM  LOS: 5 days

## 2014-05-09 NOTE — Progress Notes (Signed)
ANTIBIOTIC CONSULT NOTE  Pharmacy Consult for Levaquin Indication: COPD exacerbation  No Known Allergies  Patient Measurements: Height: 5\' 6"  (167.6 cm) Weight: (!) 419 lb 8 oz (190.284 kg) IBW/kg (Calculated) : 59.3  Vital Signs: Temp: 98.5 F (36.9 C) (03/11 0507) Temp Source: Oral (03/11 0507) BP: 124/55 mmHg (03/11 0507) Pulse Rate: 72 (03/11 0507) Intake/Output from previous day: 03/10 0701 - 03/11 0700 In: 2492.5 [P.O.:840; I.V.:1652.5] Out: -  Intake/Output from this shift: Total I/O In: 480 [P.O.:480] Out: -   Labs:  Recent Labs  05/06/14 1600 05/07/14 0556 05/08/14 0624 05/09/14 0951  WBC  --   --  12.7*  --   HGB  --   --  12.1  --   PLT  --   --  194  --   LABCREA 232.48  --   --   --   CREATININE  --  3.02* 2.93* 2.27*   Estimated Creatinine Clearance: 54 mL/min (by C-G formula based on Cr of 2.27). No results for input(s): VANCOTROUGH, VANCOPEAK, VANCORANDOM, GENTTROUGH, GENTPEAK, GENTRANDOM, TOBRATROUGH, TOBRAPEAK, TOBRARND, AMIKACINPEAK, AMIKACINTROU, AMIKACIN in the last 72 hours.   Microbiology: Recent Results (from the past 720 hour(s))  Culture, Urine     Status: None   Collection Time: 05/05/14 12:42 AM  Result Value Ref Range Status   Specimen Description URINE, CLEAN CATCH  Final   Special Requests NONE  Final   Colony Count   Final    >=100,000 COLONIES/ML Performed at [facility]    Culture   Final    Multiple bacterial morphotypes present, none predominant. Suggest appropriate recollection if clinically indicated. Performed at [facility]    Report Status 05/06/2014 FINAL  Final    Anti-infectives    Start     Dose/Rate Route Frequency Ordered Stop   05/09/14 1500  levofloxacin (LEVAQUIN) tablet 750 mg     750 mg Oral Every 48 hours 05/09/14 1317 05/13/14 1459   05/05/14 1300  levofloxacin (LEVAQUIN) IVPB 750 mg  Status:  Discontinued     750 mg 100 mL/hr over 90 Minutes Intravenous Every 48 hours 05/05/14  1233 05/09/14 1317     Assessment: 49 yo obese F with asthma/COPD exacerbation to start on empiric Levaquin. Acute renal failure noted. SCr gradually improving.  Pharmacy will sign off.  PHARMACIST - PHYSICIAN COMMUNICATION CONCERNING: Antibiotic IV to Oral Route Change Policy  RECOMMENDATION: This patient is receiving Levaquin by the intravenous route.  Based on criteria approved by the Pharmacy and Therapeutics Committee, the antibiotic(s) is/are being converted to the equivalent oral dose form(s).  DESCRIPTION: These criteria include:  Patient being treated for a respiratory tract infection, urinary tract infection, cellulitis or clostridium difficile associated diarrhea if on metronidazole  The patient is not neutropenic and does not exhibit a GI malabsorption state  The patient is eating (either orally or via tube) and/or has been taking other orally administered medications for a least 24 hours  The patient is improving clinically and has a Tmax < 100.5  If you have questions about this conversion, please contact the Pharmacy Department  [x]   [phone] )  [facility] []   [phone] )  [facility]  []   [phone] )  [facility] []   [phone] )  [facility], Maudie Shingledecker A 05/09/2014,1:18 PM

## 2014-05-09 NOTE — Plan of Care (Signed)
Problem: Phase III Progression Outcomes Goal: Activity at appropriate level-compared to baseline (UP IN CHAIR FOR HEMODIALYSIS)  Outcome: Progressing Out of bed to chair.

## 2014-05-09 NOTE — Progress Notes (Signed)
CRITICAL VALUE ALERT  Critical value received: CBG 47  Date of notification:  05/09/2014  Time of notification:  0730  Critical value read back:Yes.    Nurse who received alert:  Radene Gunning  MD notified (1st page):  Dr Roderic Palau  Time of first page:  0730  MD notified (2nd page):  Time of second page:  Responding MD:  Awaiting response  Time MD responded:  Awaiting response

## 2014-05-09 NOTE — Progress Notes (Signed)
Subjective: Interval History: Patient states that her breathing is much better. She doesn't have any cough and no difficulty breathing.  Objective: Vital signs in last 24 hours: Temp:  [97.7 F (36.5 C)-98.5 F (36.9 C)] 98.5 F (36.9 C) (03/11 0507) Pulse Rate:  [69-72] 72 (03/11 0507) Resp:  [20] 20 (03/11 0507) BP: (112-129)/(54-62) 124/55 mmHg (03/11 0507) SpO2:  [96 %-100 %] 98 % (03/11 0737) Weight change:   Intake/Output from previous day: 03/10 0701 - 03/11 0700 In: 2492.5 [P.O.:840; I.V.:1652.5] Out: -  Intake/Output this shift: Total I/O In: 240 [P.O.:240] Out: -   General appearance: alert, cooperative and no distress Resp: diminished breath sounds bilaterally Cardio: regular rate and rhythm, S1, S2 normal, no murmur, click, rub or gallop GI: soft, non-tender; bowel sounds normal; no masses,  no organomegaly Extremities: edema 1-2+ edema  Lab Results:  Recent Labs  05/08/14 0624  WBC 12.7*  HGB 12.1  HCT 40.7  PLT 194   BMET:   Recent Labs  05/08/14 0624 05/09/14 0951  NA 137 138  K 4.9 4.8  CL 103 105  CO2 27 28  GLUCOSE 153* 122*  BUN 94* 95*  CREATININE 2.93* 2.27*  CALCIUM 8.1* 8.4   No results for input(s): PTH in the last 72 hours. Iron Studies: No results for input(s): IRON, TIBC, TRANSFERRIN, FERRITIN in the last 72 hours.  Studies/Results: Ct Abdomen Pelvis Wo Contrast  05/07/2014   CLINICAL DATA:  49 year old female with lower abdominal pain for 2 weeks. Acute renal failure. Morbid obesity. Initial encounter.  EXAM: CT ABDOMEN AND PELVIS WITHOUT CONTRAST  TECHNIQUE: Multidetector CT imaging of the abdomen and pelvis was performed following the standard protocol without IV contrast.  COMPARISON:  Renal ultrasound 05/06/2014.  FINDINGS: Large body habitus degrading image quality.  Cardiomegaly. Small pericardial effusion measuring up to 12 mm in thickness (series 2, image 3). Multifocal bilateral lower lobe streaky opacity with some areas  of early consolidation. Trace bilateral pleural effusions.  Disc and facet degeneration in the spine. No acute osseous abnormality identified.  No pelvic free fluid.  Negative non contrast uterus and adnexa.  Retained stool in the rectum. Less retained stool in the distal colon. Oral contrast has reached the distal colon. Retained stool mixed with contrast from the left colon to the cecum. No dilated small bowel. Stomach and duodenum appear to be negative.  Grossly negative non contrast appearance of the liver, gallbladder, spleen, pancreas and adrenal glands.  No hydronephrosis. No nephrolithiasis. Limited anatomic detail of the ureters, but no strong evidence of hydroureter. The bladder is mildly distended but otherwise unremarkable. No abdominal free fluid identified. Mild Aortoiliac calcified atherosclerosis noted.  Widespread subcutaneous fat stranding of the body wall and panniculus.  IMPRESSION: 1. Image quality degraded by a large body habitus. 2. Cardiomegaly with small pericardial effusion. 3. Trace pleural effusions and bilateral lower lobe atelectasis versus pneumonia. 4. Body wall edema, nonspecific but may relate to anasarca. 5. No acute abdominal visceral finding is evident. No hydronephrosis.   Electronically Signed   By: Genevie Ann M.D.   On: 05/07/2014 16:28   Dg Chest Port 1 View  05/07/2014   CLINICAL DATA:  PICC placement, initial encounter.  EXAM: PORTABLE CHEST - 1 VIEW  COMPARISON:  05/03/2014.  FINDINGS: Trachea is midline. Heart is enlarged. Right PICC tip projects at the SVC RA junction or high right atrium. Image quality is degraded by large body habitus. Lungs are low in volume. No pleural fluid.  IMPRESSION: Low lung volumes.   Electronically Signed   By: Lorin Picket M.D.   On: 05/07/2014 13:59    I have reviewed the patient's current medications.  Assessment/Plan: Problem #1 renal failure: Possibly acute on chronic. Her renal function is progressively improving. Patient is  asymptomatic. Problem #2 exacerbation of her asthma: Patient seems to be feeling better Problem #3 hyperkalemia: Her potassium is normal Problem #4 hypertension: Her blood pressure is reasonably controlled Problem #5 morbid obesity Problem #6 leg edema/anasarca . Patient on Lasix. Still patient has edema. Patient states that she is making process of urine but unfortunately she was not able to collect it and so we have discussed yesterday. Problem #7 diabetes Problem #8 possible chronic renal failure. Patient seems to have acute kidney injury in 2015 but her creatinine has improved to 1 recently. Ultrasound showed right kidney to be 11.6 but left one is not visualized. This could be multifactorial includingNSAID/diabetes/hypertension/obesity related Plan: We'll decrease IV fluid to 50 mL per hour We'll increase Lasix to 80 mg IV twice a day We'll check her basic metabolic panel in the morning   LOS: 5 days   Benjamen Koelling S 05/09/2014,11:32 AM

## 2014-05-09 NOTE — Progress Notes (Signed)
Inpatient Diabetes Program Recommendations  AACE/ADA: New Consensus Statement on Inpatient Glycemic Control (2013)  Target Ranges:  Prepandial:   less than 140 mg/dL      Peak postprandial:   less than 180 mg/dL (1-2 hours)      Critically ill patients:  140 - 180 mg/dL   Results for [name], [name] (MRN [id number]) as of 05/09/2014 09:14  Ref. Range 05/08/2014 07:38 05/08/2014 11:56 05/08/2014 17:06 05/08/2014 21:47 05/09/2014 07:25 05/09/2014 07:52  Glucose-Capillary Latest Range: 70-99 mg/dL 141 (H) 156 (H) 117 (H) 90 43 (LL) 106 (H)   Diabetes history: DM2 Outpatient Diabetes medications: 70/30 48 units BID, Metformin 1000 mg BID, Oseni 25-15 mg daily Current orders for Inpatient glycemic control: 70/30 56 units BID, Novolog 0-20 units TID, Novolog 0-5 units HS, Tradjenta 5 mg daily, Actos 15 mg daily  Inpatient Diabetes Program Recommendations Insulin - Basal: Fasting glucose 43 mg/dl this morning. Steroids are being tapered. Please consider decreasing 70/30 to 50 units BID.  Thanks, Barnie Alderman, RN, MSN, CCRN, CDE Diabetes Coordinator Inpatient Diabetes Program [phone] (Team Pager) [phone] (AP office) [phone] [facility] office)

## 2014-05-10 LAB — BASIC METABOLIC PANEL
Anion gap: 4 — ABNORMAL LOW (ref 5–15)
BUN: 88 mg/dL — AB (ref 6–23)
CO2: 31 mmol/L (ref 19–32)
Calcium: 8.7 mg/dL (ref 8.4–10.5)
Chloride: 108 mmol/L (ref 96–112)
Creatinine, Ser: 1.77 mg/dL — ABNORMAL HIGH (ref 0.50–1.10)
GFR calc non Af Amer: 33 mL/min — ABNORMAL LOW (ref >90)
GFR, EST AFRICAN AMERICAN: 38 mL/min — AB (ref >90)
GLUCOSE: 110 mg/dL — AB (ref 70–99)
Potassium: 4.7 mmol/L (ref 3.5–5.1)
SODIUM: 143 mmol/L (ref 135–145)

## 2014-05-10 LAB — GLUCOSE, CAPILLARY
GLUCOSE-CAPILLARY: 90 mg/dL (ref 70–99)
GLUCOSE-CAPILLARY: 146 mg/dL — AB (ref 70–99)
Glucose-Capillary: 99 mg/dL (ref 70–99)
Glucose-Capillary: 107 mg/dL — ABNORMAL HIGH (ref 70–99)

## 2014-05-10 MED ORDER — TORSEMIDE 20 MG PO TABS
40.0000 mg | ORAL_TABLET | Freq: Every day | ORAL | Status: DC
  Administered 2014-05-10 – 2014-05-11 (×2): 40 mg via ORAL
  Filled 2014-05-10 (×2): qty 2

## 2014-05-10 NOTE — Progress Notes (Signed)
[facility] PROGRESS NOTE  [name] [id number] DOB: Sep 26, 1965 DOA: 05/03/2014 PCP: [name], MD  Assessment/Plan: Asthma with Acute Exacerbation/Acute Hypoxemic Respiratory Failure - BS with improved air flow. sats continue to improve. Change Solu-Medrol to prednisone taper. continue levaquin day #6. Will likely discontinue antibiotics after tomorrow's dose. Scheduled duonebs.  Echo shows preserved ejection fraction with diastolic dysfunction. Continue diuretics. Daily weights. Intake and output  Morbid Obesity: BMI 65.4. Nutritional consult  Acute on CKD Stage III -Baseline Cr around 1.0-1.99. Creatinine continues to improve. Nephrology following. She was initially started on IV fluids and IV lasix. Creatinine has started to improve, so IV fluids have been discontinued and she has been started on oral demadex. Continue current treatments and follow renal function.  HTN -Stable  DM II - Steroids contributing and will continue taper. Healthy appetite as well. A1c 10.9. Metformin on hold.  Hyperkalemia Improved with Kayexalate   Hyperlipidemia 1. -Continue statin  Code Status: full Family Communication: no family present Disposition Plan: home when ready   Consultants:  nephrology  Procedures:    Antibiotics:  levaquin 05/05/14>>  HPI/Subjective: No new complaints.  Objective: Filed Vitals:   05/10/14 0644  BP: 153/60  Pulse: 76  Temp: 98 F (36.7 C)  Resp: 20    Intake/Output Summary (Last 24 hours) at 05/10/14 1140 Last data filed at 05/09/14 1830  Gross per 24 hour  Intake 1079.58 ml  Output      0 ml  Net 1079.58 ml   Filed Weights   05/05/14 0647 05/06/14 1253 05/07/14 1050  Weight: 183.9 kg (405 lb 6.8 oz) 188.832 kg (416 lb 4.8 oz) 190.284 kg (419 lb 8 oz)    Exam:   General:  Morbidly obese appears comfortable  Cardiovascular: HS distant RRR no m/g/r no LE edema  Respiratory: normal effort BS distant but  improved air flow  Abdomen: morbidly obese soft non-tender +BS  Musculoskeletal: no clubbing or cyanosis   Data Reviewed: Basic Metabolic Panel:  Recent Labs Lab 05/06/14 0624 05/07/14 0556 05/08/14 0624 05/09/14 0951 05/10/14 0754  NA 138 136 137 138 143  K 5.7* 5.6* 4.9 4.8 4.7  CL 104 105 103 105 108  CO2 27 26 27 28 31   GLUCOSE 203* 178* 153* 122* 110*  BUN 77* 87* 94* 95* 88*  CREATININE 2.82* 3.02* 2.93* 2.27* 1.77*  CALCIUM 8.4 8.2* 8.1* 8.4 8.7   Liver Function Tests:  Recent Labs Lab 05/09/14 0951  AST 16  ALT 33  ALKPHOS 78  BILITOT 0.6  PROT 6.5  ALBUMIN 2.9*   No results for input(s): LIPASE, AMYLASE in the last 168 hours. No results for input(s): AMMONIA in the last 168 hours. CBC:  Recent Labs Lab 05/03/14 1406 05/05/14 0642 05/08/14 0624  WBC 10.7* 13.7* 12.7*  NEUTROABS 6.6  --   --   HGB 11.2* 11.3* 12.1  HCT 37.0 38.1 40.7  MCV 86.4 87.4 87.7  PLT 187 209 194   Cardiac Enzymes: No results for input(s): CKTOTAL, CKMB, CKMBINDEX, TROPONINI in the last 168 hours. BNP (last 3 results)  Recent Labs  05/03/14 1406  BNP 122.0*    ProBNP (last 3 results) No results for input(s): PROBNP in the last 8760 hours.  CBG:  Recent Labs Lab 05/09/14 0752 05/09/14 1215 05/09/14 1652 05/09/14 2230 05/10/14 0801  GLUCAP 106* 112* 199* 154* 99    Recent Results (from the past 240 hour(s))  Culture, Urine     Status: None  Collection Time: 05/05/14 12:42 AM  Result Value Ref Range Status   Specimen Description URINE, CLEAN CATCH  Final   Special Requests NONE  Final   Colony Count   Final    >=100,000 COLONIES/ML Performed at [facility]    Culture   Final    Multiple bacterial morphotypes present, none predominant. Suggest appropriate recollection if clinically indicated. Performed at [facility]    Report Status 05/06/2014 FINAL  Final     Studies: No results found.  Scheduled Meds: . aspirin EC  81 mg  Oral Daily  . atorvastatin  20 mg Oral QHS  . carvedilol  3.125 mg Oral BID  . enoxaparin (LOVENOX) injection  80 mg Subcutaneous Q24H  . gabapentin  300 mg Oral TID  . guaiFENesin  1,200 mg Oral BID  . insulin aspart  0-20 Units Subcutaneous TID WC  . insulin aspart  0-5 Units Subcutaneous QHS  . insulin aspart protamine- aspart  45 Units Subcutaneous BID WC  . ipratropium-albuterol  3 mL Nebulization BID  . levofloxacin  750 mg Oral Q48H  . linagliptin  5 mg Oral Daily   And  . pioglitazone  15 mg Oral Daily  . mometasone-formoterol  2 puff Inhalation BID  . montelukast  10 mg Oral Daily  . predniSONE  60 mg Oral Q breakfast  . sodium chloride  10-40 mL Intracatheter Q12H  . torsemide  40 mg Oral Daily   Continuous Infusions:    Principal Problem:   Asthma exacerbation Active Problems:   ARF (acute renal failure)   Acute respiratory failure with hypoxia   Diabetes mellitus type 2 in obese   Hypertension   Hyperlipidemia   Morbid obesity   CKD (chronic kidney disease) stage 3, GFR 30-59 ml/min   Acute renal failure    Time spent: [facility] Pager [phone]. If 7PM-7AM, please contact night-coverage at [email or web address], password [facility] 05/10/2014, 11:40 AM  LOS: 6 days

## 2014-05-10 NOTE — Progress Notes (Signed)
Subjective: Interval History: Patient continued to feel better. She has occasional cough. Her breathing however is much better.  Objective: Vital signs in last 24 hours: Temp:  [98 F (36.7 C)-98.8 F (37.1 C)] 98 F (36.7 C) (03/12 0644) Pulse Rate:  [69-76] 76 (03/12 0644) Resp:  [18-20] 20 (03/12 0644) BP: (130-153)/(60-94) 153/60 mmHg (03/12 0644) SpO2:  [94 %-100 %] 99 % (03/12 0803) Weight change:   Intake/Output from previous day: 03/11 0701 - 03/12 0700 In: 1319.6 [P.O.:480; I.V.:839.6] Out: 200 [Urine:200] Intake/Output this shift:    General appearance: alert, cooperative and no distress Resp: diminished breath sounds bilaterally Cardio: regular rate and rhythm, S1, S2 normal, no murmur, click, rub or gallop GI: soft, non-tender; bowel sounds normal; no masses,  no organomegaly Extremities: edema 1-2+ edema  Lab Results:  Recent Labs  05/08/14 0624  WBC 12.7*  HGB 12.1  HCT 40.7  PLT 194   BMET:   Recent Labs  05/08/14 0624 05/09/14 0951  NA 137 138  K 4.9 4.8  CL 103 105  CO2 27 28  GLUCOSE 153* 122*  BUN 94* 95*  CREATININE 2.93* 2.27*  CALCIUM 8.1* 8.4   No results for input(s): PTH in the last 72 hours. Iron Studies: No results for input(s): IRON, TIBC, TRANSFERRIN, FERRITIN in the last 72 hours.  Studies/Results: No results found.  I have reviewed the patient's current medications.  Assessment/Plan: Problem #1 renal failure: Possibly acute on chronic. Her renal function is progressively improving. Patient is asymptomatic. Problem #2 exacerbation of her asthma: Patient seems to be feeling better Problem #3 hyperkalemia: Her potassium has remained normal. Problem #4 hypertension: Her blood pressure is reasonably controlled Problem #5 morbid obesity Problem #6 leg edema/anasarca . Patient still has some edema. Presently patient is not able to collect her urine. She states that probably she needs to use a bedside comod. Problem #7  diabetes Problem #8 possible chronic renal failure. Patient seems to have acute kidney injury in 2015 but her creatinine has improved to 1 recently. Ultrasound showed right kidney to be 11.6 but left one is not visualized. This could be multifactorial includingNSAID/diabetes/hypertension/obesity related Plan: We'll DC IV fluid DC Lasix We will start her on Demadex 40 mg once a day  We'll check her basic metabolic panel in the morning   LOS: 6 days   Ellen Goris S 05/10/2014,8:19 AM

## 2014-05-11 LAB — GLUCOSE, CAPILLARY
GLUCOSE-CAPILLARY: 150 mg/dL — AB (ref 70–99)
GLUCOSE-CAPILLARY: 184 mg/dL — AB (ref 70–99)
Glucose-Capillary: 115 mg/dL — ABNORMAL HIGH (ref 70–99)

## 2014-05-11 LAB — BASIC METABOLIC PANEL
ANION GAP: 3 — AB (ref 5–15)
BUN: 79 mg/dL — AB (ref 6–23)
CALCIUM: 8.8 mg/dL (ref 8.4–10.5)
CO2: 33 mmol/L — ABNORMAL HIGH (ref 19–32)
Chloride: 104 mmol/L (ref 96–112)
Creatinine, Ser: 1.48 mg/dL — ABNORMAL HIGH (ref 0.50–1.10)
GFR calc Af Amer: 48 mL/min — ABNORMAL LOW (ref >90)
GFR calc non Af Amer: 41 mL/min — ABNORMAL LOW (ref >90)
GLUCOSE: 135 mg/dL — AB (ref 70–99)
POTASSIUM: 4.3 mmol/L (ref 3.5–5.1)
SODIUM: 140 mmol/L (ref 135–145)

## 2014-05-11 MED ORDER — TORSEMIDE 20 MG PO TABS: 40.0000 mg | ORAL_TABLET | Freq: Every day | ORAL | Status: DC

## 2014-05-11 MED ORDER — PREDNISONE 10 MG PO TABS: ORAL_TABLET | ORAL | Status: DC

## 2014-05-11 NOTE — Discharge Summary (Signed)
Physician Discharge Summary  [name] [id number] DOB: 1965-09-02 DOA: 05/03/2014  PCP: [name], MD  Admit date: 05/03/2014 Discharge date: 05/11/2014  Time spent: 45 minutes  Recommendations for Outpatient Follow-up:  1. Patient will follow-up with primary care physician a 1-2 weeks 2. Follow-up with nephrology in 3 weeks 3. She has been set up with nocturnal oxygen. I suspect that she will need at least a CPAP at home. She will need a repeat sleep study to be arranged by her primary care physician.  Discharge Diagnoses:  Principal Problem:   Asthma exacerbation Active Problems:   ARF (acute renal failure)   Acute respiratory failure with hypoxia   Diabetes mellitus type 2 in obese   Hypertension   Hyperlipidemia   Morbid obesity   CKD (chronic kidney disease) stage 3, GFR 30-59 ml/min   Acute renal failure   Discharge Condition: improved  Diet recommendation: low salt, low carb  Filed Weights   05/05/14 0647 05/06/14 1253 05/07/14 1050  Weight: 183.9 kg (405 lb 6.8 oz) 188.832 kg (416 lb 4.8 oz) 190.284 kg (419 lb 8 oz)    History of present illness:  This patient was present the hospital shortness of breath. She has a history of asthma and presented with one-week history of worsening shortness of breath and dyspnea on exertion. She was found to have asthma exacerbation and was admitted to the hospital for further treatment  Hospital Course:  Patient was started on intravenous steroids, bronchodilators and antibiotics. Her progress was slow, but she is not been weaned back down to room air. While she is ambulating and resting on room air, her oxygen saturations are greater than 95%. Overnight, she was noted to have some hypoxia with oxygen saturation in the 80s and required oxygen. I suspect this is related to her body habitus and likely obesity hypoventilation. She will be sent home with nocturnal oxygen, but will need a sleep study to ensure that she  receives C Pap/BiPAP. Will defer this arrangement to be made by her primary care physician.  She also developed significant renal failure during her hospital stay. This was felt to be multifactorial related to NSAID/diabetes/hypertension. She was initially started on IV fluids and then was also started on Lasix. She was seen by nephrology who subsequently discontinued her IV fluids and continued her on Demadex. She has had good urine output and her renal function does not recover back down to 1.4. She plans follow-up with nephrology in the next 3 weeks. Case was discussed with Dr. Lowanda Foster who agrees with discharge home today. The remainder of her medical issues remained stable.  Procedures:  Echo: - Study limited by poor acoustic windows. There is moderate LVH with LVEF 70-75% and grade 2 diastolic dysfunction. Moderate left atrial enlargement. Mitral annular calcification noted. Aortic annular calcification noted with poor visualization of valve structure. CVP elevated but unable to estimate PASP. Small, circumferential pericardial effusion noted.  Consultations:  Nephorology  Discharge Exam: Filed Vitals:   05/11/14 1502  BP: 119/42  Pulse: 71  Temp: 98.4 F (36.9 C)  Resp: 20    General: NAD Cardiovascular: S1, s2 RRR Respiratory: CTA B  Discharge Instructions   Discharge Instructions    Diet - low sodium heart healthy    Complete by:  As directed      Diet Carb Modified    Complete by:  As directed      For home use only DME oxygen    Complete by:  As  directed   Mode or (Route):  Nasal cannula  Liters per Minute:  2  Frequency:  Only at night  Oxygen conserving device:  Yes  Oxygen delivery system:  Gas     Increase activity slowly    Complete by:  As directed           Discharge Medication List as of 05/11/2014  6:00 PM    START taking these medications   Details  predniSONE (DELTASONE) 10 MG tablet Take 40mg  po daily for 2 days then 30mg  po daily  for 2 days then 20mg  po daily for 2 days then 10mg  po daily for 2 days then stop, Print    torsemide (DEMADEX) 20 MG tablet Take 2 tablets (40 mg total) by mouth daily., Starting 05/11/2014, Until Discontinued, Print      CONTINUE these medications which have NOT CHANGED   Details  albuterol (PROVENTIL HFA;VENTOLIN HFA) 108 (90 BASE) MCG/ACT inhaler Inhale 2 puffs into the lungs every 6 (six) hours as needed for wheezing., Until Discontinued, Historical Med    albuterol (PROVENTIL) (2.5 MG/3ML) 0.083% nebulizer solution Take 2.5 mg by nebulization every 4 (four) hours as needed for wheezing or shortness of breath., Until Discontinued, Historical Med    Alogliptin-Pioglitazone (OSENI) 25-15 MG TABS Take 1 tablet by mouth daily., Until Discontinued, Historical Med    aspirin EC 81 MG tablet Take 81 mg by mouth daily. , Until Discontinued, Historical Med    atorvastatin (LIPITOR) 20 MG tablet Take 20 mg by mouth at bedtime. , Until Discontinued, Historical Med    benzonatate (TESSALON) 100 MG capsule Take 1 capsule (100 mg total) by mouth 3 (three) times daily as needed for cough., Starting 02/06/2014, Until Discontinued, Print    carvedilol (COREG) 6.25 MG tablet Take 1 tablet by mouth 2 (two) times daily., Starting 11/08/2013, Until Discontinued, Historical Med    Fluticasone-Salmeterol (ADVAIR) 100-50 MCG/DOSE AEPB Inhale 1 puff into the lungs every 12 (twelve) hours as needed (for shortness of breath). , Until Discontinued, Historical Med    gabapentin (NEURONTIN) 300 MG capsule Take 300 mg by mouth 3 (three) times daily. , Until Discontinued, Historical Med    Insulin Isophane & Regular Human (HUMULIN 70/30 KWIKPEN) (70-30) 100 UNIT/ML PEN Inject 48 Units into the skin 2 (two) times daily., Until Discontinued, Historical Med    metFORMIN (GLUCOPHAGE) 1000 MG tablet Take 1 tablet (1,000 mg total) by mouth 2 (two) times daily with a meal., Starting 04/10/2013, Until Discontinued, No Print     montelukast (SINGULAIR) 10 MG tablet Take 10 mg by mouth daily. , Until Discontinued, Historical Med    nitroGLYCERIN (NITROSTAT) 0.4 MG SL tablet Place 0.4 mg under the tongue every 5 (five) minutes as needed for chest pain., Until Discontinued, Historical Med    potassium chloride (K-DUR,KLOR-CON) 10 MEQ tablet Take 10 mEq by mouth 2 (two) times daily., Until Discontinued, Historical Med    tetrahydrozoline 0.05 % ophthalmic solution Place 1 drop into both eyes daily as needed. Dry, Itchy Eyes, Until Discontinued, Historical Med      STOP taking these medications     furosemide (LASIX) 40 MG tablet      ibuprofen (ADVIL,MOTRIN) 800 MG tablet      valsartan-hydrochlorothiazide (DIOVAN-HCT) 320-25 MG per tablet      nitrofurantoin, macrocrystal-monohydrate, (MACROBID) 100 MG capsule        No Known Allergies Follow-up Information    Follow up with [facility].   Specialty:  Emergency  Medicine   Why:  As needed, If symptoms worsen   Contact information:   [address] [id number] [place] [phone]       The results of significant diagnostics from this hospitalization (including imaging, microbiology, ancillary and laboratory) are listed below for reference.    Significant Diagnostic Studies: Ct Abdomen Pelvis Wo Contrast  05/07/2014   CLINICAL DATA:  49 year old female with lower abdominal pain for 2 weeks. Acute renal failure. Morbid obesity. Initial encounter.  EXAM: CT ABDOMEN AND PELVIS WITHOUT CONTRAST  TECHNIQUE: Multidetector CT imaging of the abdomen and pelvis was performed following the standard protocol without IV contrast.  COMPARISON:  Renal ultrasound 05/06/2014.  FINDINGS: Large body habitus degrading image quality.  Cardiomegaly. Small pericardial effusion measuring up to 12 mm in thickness (series 2, image 3). Multifocal bilateral lower lobe streaky opacity with some areas of early consolidation. Trace  bilateral pleural effusions.  Disc and facet degeneration in the spine. No acute osseous abnormality identified.  No pelvic free fluid.  Negative non contrast uterus and adnexa.  Retained stool in the rectum. Less retained stool in the distal colon. Oral contrast has reached the distal colon. Retained stool mixed with contrast from the left colon to the cecum. No dilated small bowel. Stomach and duodenum appear to be negative.  Grossly negative non contrast appearance of the liver, gallbladder, spleen, pancreas and adrenal glands.  No hydronephrosis. No nephrolithiasis. Limited anatomic detail of the ureters, but no strong evidence of hydroureter. The bladder is mildly distended but otherwise unremarkable. No abdominal free fluid identified. Mild Aortoiliac calcified atherosclerosis noted.  Widespread subcutaneous fat stranding of the body wall and panniculus.  IMPRESSION: 1. Image quality degraded by a large body habitus. 2. Cardiomegaly with small pericardial effusion. 3. Trace pleural effusions and bilateral lower lobe atelectasis versus pneumonia. 4. Body wall edema, nonspecific but may relate to anasarca. 5. No acute abdominal visceral finding is evident. No hydronephrosis.   Electronically Signed   By: Genevie Ann M.D.   On: 05/07/2014 16:28   US Renal  05/06/2014   CLINICAL DATA:  Acute renal failure  EXAM: RENAL/URINARY TRACT ULTRASOUND COMPLETE  COMPARISON:  None.  FINDINGS: Right Kidney:  Length: 11.6 cm.  No mass or hydronephrosis.  Left Kidney:  Not discretely visualized.  Bladder:  Within normal limits.  IMPRESSION: Right kidney and bladder are within normal limits.  No hydronephrosis.  Left kidney is not discretely visualized.   Electronically Signed   By: Julian Hy M.D.   On: 05/06/2014 19:05   Dg Chest Port 1 View  05/07/2014   CLINICAL DATA:  PICC placement, initial encounter.  EXAM: PORTABLE CHEST - 1 VIEW  COMPARISON:  05/03/2014.  FINDINGS: Trachea is midline. Heart is enlarged. Right  PICC tip projects at the SVC RA junction or high right atrium. Image quality is degraded by large body habitus. Lungs are low in volume. No pleural fluid.  IMPRESSION: Low lung volumes.   Electronically Signed   By: Lorin Picket M.D.   On: 05/07/2014 13:59   Dg Chest Port 1 View  05/03/2014   CLINICAL DATA:  Short of breath. Wheezing since last week. Asthma. Diabetes.  EXAM: PORTABLE CHEST - 1 VIEW  COMPARISON:  02/06/2014  FINDINGS: Moderately degraded exam secondary to patient body habitus and AP portable technique. Midline trachea. Cardiomegaly accentuated by AP portable technique. No gross pleural fluid. No pneumothorax. Pulmonary interstitial prominence is at least partially due to technique and low  lung volumes. No lobar consolidation.  IMPRESSION: Moderately degraded exam. Consider PA and lateral radiographs if possible.  Cardiomegaly and low lung volumes. Pulmonary interstitial prominence which is at least partially due to low volumes and technique. Cannot exclude mild pulmonary venous congestion.   Electronically Signed   By: Abigail Miyamoto M.D.   On: 05/03/2014 16:03    Microbiology: Recent Results (from the past 240 hour(s))  Culture, Urine     Status: None   Collection Time: 05/05/14 12:42 AM  Result Value Ref Range Status   Specimen Description URINE, CLEAN CATCH  Final   Special Requests NONE  Final   Colony Count   Final    >=100,000 COLONIES/ML Performed at [facility]    Culture   Final    Multiple bacterial morphotypes present, none predominant. Suggest appropriate recollection if clinically indicated. Performed at [facility]    Report Status 05/06/2014 FINAL  Final     Labs: Basic Metabolic Panel:  Recent Labs Lab 05/07/14 0556 05/08/14 0624 05/09/14 0951 05/10/14 0754 05/11/14 0709  NA 136 137 138 143 140  K 5.6* 4.9 4.8 4.7 4.3  CL 105 103 105 108 104  CO2 26 27 28 31  33*  GLUCOSE 178* 153* 122* 110* 135*  BUN 87* 94* 95* 88* 79*   CREATININE 3.02* 2.93* 2.27* 1.77* 1.48*  CALCIUM 8.2* 8.1* 8.4 8.7 8.8   Liver Function Tests:  Recent Labs Lab 05/09/14 0951  AST 16  ALT 33  ALKPHOS 78  BILITOT 0.6  PROT 6.5  ALBUMIN 2.9*   No results for input(s): LIPASE, AMYLASE in the last 168 hours. No results for input(s): AMMONIA in the last 168 hours. CBC:  Recent Labs Lab 05/05/14 0642 05/08/14 0624  WBC 13.7* 12.7*  HGB 11.3* 12.1  HCT 38.1 40.7  MCV 87.4 87.7  PLT 209 194   Cardiac Enzymes: No results for input(s): CKTOTAL, CKMB, CKMBINDEX, TROPONINI in the last 168 hours. BNP: BNP (last 3 results)  Recent Labs  05/03/14 1406  BNP 122.0*    ProBNP (last 3 results) No results for input(s): PROBNP in the last 8760 hours.  CBG:  Recent Labs Lab 05/10/14 1658 05/10/14 2155 05/11/14 0751 05/11/14 1200 05/11/14 1703  GLUCAP 107* 146* 115* 150* 184*       Signed:  Karra Pink  [facility] 05/11/2014, 7:20 PM

## 2014-05-11 NOTE — Progress Notes (Signed)
Pt discharged home today per Dr. Memon. Pt's IV site D/C'd and WDL. Pt's VSS. Pt provided with home medication list, discharge instructions and prescriptions. Verbalized understanding. Pt left floor via WC in stable condition accompanied by NT.  

## 2014-05-11 NOTE — Discharge Instructions (Signed)
Prednisone as prescribed.  Albuterol neb every 4 hours for the next 2 days.  Return to the ER if your symptoms substantially worsen or change.    Asthma Asthma is a recurring condition in which the airways tighten and narrow. Asthma can make it difficult to breathe. It can cause coughing, wheezing, and shortness of breath. Asthma episodes, also called asthma attacks, range from minor to life-threatening. Asthma cannot be cured, but medicines and lifestyle changes can help control it. CAUSES Asthma is believed to be caused by inherited (genetic) and environmental factors, but its exact cause is unknown. Asthma may be triggered by allergens, lung infections, or irritants in the air. Asthma triggers are different for each person. Common triggers include:   Animal dander.  Dust mites.  Cockroaches.  Pollen from trees or grass.  Mold.  Smoke.  Air pollutants such as dust, household cleaners, hair sprays, aerosol sprays, paint fumes, strong chemicals, or strong odors.  Cold air, weather changes, and winds (which increase molds and pollens in the air).  Strong emotional expressions such as crying or laughing hard.  Stress.  Certain medicines (such as aspirin) or types of drugs (such as beta-blockers).  Sulfites in foods and drinks. Foods and drinks that may contain sulfites include dried fruit, potato chips, and sparkling grape juice.  Infections or inflammatory conditions such as the flu, a cold, or an inflammation of the nasal membranes (rhinitis).  Gastroesophageal reflux disease (GERD).  Exercise or strenuous activity. SYMPTOMS Symptoms may occur immediately after asthma is triggered or many hours later. Symptoms include:  Wheezing.  Excessive nighttime or early morning coughing.  Frequent or severe coughing with a common cold.  Chest tightness.  Shortness of breath. DIAGNOSIS  The diagnosis of asthma is made by a review of your medical history and a physical exam.  Tests may also be performed. These may include:  Lung function studies. These tests show how much air you breathe in and out.  Allergy tests.  Imaging tests such as X-rays. TREATMENT  Asthma cannot be cured, but it can usually be controlled. Treatment involves identifying and avoiding your asthma triggers. It also involves medicines. There are 2 classes of medicine used for asthma treatment:   Controller medicines. These prevent asthma symptoms from occurring. They are usually taken every day.  Reliever or rescue medicines. These quickly relieve asthma symptoms. They are used as needed and provide short-term relief. Your health care provider will help you create an asthma action plan. An asthma action plan is a written plan for managing and treating your asthma attacks. It includes a list of your asthma triggers and how they may be avoided. It also includes information on when medicines should be taken and when their dosage should be changed. An action plan may also involve the use of a device called a peak flow meter. A peak flow meter measures how well the lungs are working. It helps you monitor your condition. HOME CARE INSTRUCTIONS   Take medicines only as directed by your health care provider. Speak with your health care provider if you have questions about how or when to take the medicines.  Use a peak flow meter as directed by your health care provider. Record and keep track of readings.  Understand and use the action plan to help minimize or stop an asthma attack without needing to seek medical care.  Control your home environment in the following ways to help prevent asthma attacks:  Do not smoke. Avoid being exposed to  secondhand smoke.  Change your heating and air conditioning filter regularly.  Limit your use of fireplaces and wood stoves.  Get rid of pests (such as roaches and mice) and their droppings.  Throw away plants if you see mold on them.  Clean your floors and  dust regularly. Use unscented cleaning products.  Try to have someone else vacuum for you regularly. Stay out of rooms while they are being vacuumed and for a short while afterward. If you vacuum, use a dust mask from a hardware store, a double-layered or microfilter vacuum cleaner bag, or a vacuum cleaner with a HEPA filter.  Replace carpet with wood, tile, or vinyl flooring. Carpet can trap dander and dust.  Use allergy-proof pillows, mattress covers, and box spring covers.  Wash bed sheets and blankets every week in hot water and dry them in a dryer.  Use blankets that are made of polyester or cotton.  Clean bathrooms and kitchens with bleach. If possible, have someone repaint the walls in these rooms with mold-resistant paint. Keep out of the rooms that are being cleaned and painted.  Wash hands frequently. SEEK MEDICAL CARE IF:   You have wheezing, shortness of breath, or a cough even if taking medicine to prevent attacks.  The colored mucus you cough up (sputum) is thicker than usual.  Your sputum changes from clear or white to yellow, green, gray, or bloody.  You have any problems that may be related to the medicines you are taking (such as a rash, itching, swelling, or trouble breathing).  You are using a reliever medicine more than 2-3 times per week.  Your peak flow is still at 50-79% of your personal best after following your action plan for 1 hour.  You have a fever. SEEK IMMEDIATE MEDICAL CARE IF:   You seem to be getting worse and are unresponsive to treatment during an asthma attack.  You are short of breath even at rest.  You get short of breath when doing very little physical activity.  You have difficulty eating, drinking, or talking due to asthma symptoms.  You develop chest pain.  You develop a fast heartbeat.  You have a bluish color to your lips or fingernails.  You are light-headed, dizzy, or faint.  Your peak flow is less than 50% of your  personal best. MAKE SURE YOU:   Understand these instructions.  Will watch your condition.  Will get help right away if you are not doing well or get worse. Document Released: 02/14/2005 Document Revised: 07/01/2013 Document Reviewed: 09/13/2012 [facility] Patient Information 2015 [place], [place]. This information is not intended to replace advice given to you by your health care provider. Make sure you discuss any questions you have with your health care provider.

## 2014-05-11 NOTE — Progress Notes (Signed)
Subjective: Interval History: Patient at this moment offers no complaints. Her breathing is better and she has less cough and difficulty breathing  Objective: Vital signs in last 24 hours: Temp:  [97.6 F (36.4 C)-98.7 F (37.1 C)] 97.6 F (36.4 C) (03/13 0602) Pulse Rate:  [71-82] 71 (03/13 0602) Resp:  [20-22] 22 (03/13 0602) BP: (146-153)/(60-64) 152/60 mmHg (03/13 0602) SpO2:  [84 %-100 %] 95 % (03/13 0824) Weight change:   Intake/Output from previous day: 03/12 0701 - 03/13 0700 In: 730 [P.O.:720; I.V.:10] Out: 1200 [Urine:1200] Intake/Output this shift:    General appearance: alert, cooperative and no distress Resp: diminished breath sounds bilaterally Cardio: regular rate and rhythm, S1, S2 normal, no murmur, click, rub or gallop GI: soft, non-tender; bowel sounds normal; no masses,  no organomegaly Extremities: edema 1-2+ edema  Lab Results: No results for input(s): WBC, HGB, HCT, PLT in the last 72 hours. BMET:   Recent Labs  05/10/14 0754 05/11/14 0709  NA 143 140  K 4.7 4.3  CL 108 104  CO2 31 33*  GLUCOSE 110* 135*  BUN 88* 79*  CREATININE 1.77* 1.48*  CALCIUM 8.7 8.8   No results for input(s): PTH in the last 72 hours. Iron Studies: No results for input(s): IRON, TIBC, TRANSFERRIN, FERRITIN in the last 72 hours.  Studies/Results: No results found.  I have reviewed the patient's current medications.  Assessment/Plan: Problem #1 renal failure: Possibly acute on chronic. Her kidney function is improving progressively. Problem #2 exacerbation of her asthma: Patient seems to be feeling better. Patient however was hypoxic small morning and she was put back on oxygen. Problem #3 hyperkalemia: Her potassium has remained normal. Problem #4 hypertension: Her blood pressure is reasonably controlled Problem #5 morbid obesity Problem #6 leg edema/anasarca . Patient presently on Demadex. Her urine output is reasonable and she was able to set some of the urine  for measurement. Problem #7 diabetes Problem #8 possible chronic renal failure. Patient seems to have acute kidney injury in 2015 but her creatinine has improved to 1 recently. Ultrasound showed right kidney to be 11.6 but left one is not visualized. This could be multifactorial includingNSAID/diabetes/hypertension/obesity related Plan: We'll continue with Demadex We'll check her basic metabolic panel in the morning   LOS: 7 days   Justo Hengel S 05/11/2014,9:10 AM

## 2014-05-11 NOTE — Progress Notes (Signed)
SATURATION QUALIFICATIONS: (This note is used to comply with regulatory documentation for home oxygen)  Patient Saturations on Room Air at Rest = 100%  Patient Saturations on Room Air while Ambulating = 95%   Please briefly explain why patient needs home oxygen:  Pt's O2 saturation drops at night when she is sleeping, O2 saturations range between 80-85% on RA, at rest, asleep.

## 2014-05-12 NOTE — Progress Notes (Signed)
UR chart review completed.  

## 2014-05-14 DIAGNOSIS — N183 Chronic kidney disease, stage 3 (moderate): Secondary | ICD-10-CM | POA: Diagnosis not present

## 2014-05-14 DIAGNOSIS — I129 Hypertensive chronic kidney disease with stage 1 through stage 4 chronic kidney disease, or unspecified chronic kidney disease: Secondary | ICD-10-CM | POA: Diagnosis not present

## 2014-05-14 DIAGNOSIS — E669 Obesity, unspecified: Secondary | ICD-10-CM | POA: Diagnosis not present

## 2014-05-14 DIAGNOSIS — J4550 Severe persistent asthma, uncomplicated: Secondary | ICD-10-CM | POA: Diagnosis not present

## 2014-05-14 DIAGNOSIS — E785 Hyperlipidemia, unspecified: Secondary | ICD-10-CM | POA: Diagnosis not present

## 2014-05-14 DIAGNOSIS — Z794 Long term (current) use of insulin: Secondary | ICD-10-CM | POA: Diagnosis not present

## 2014-05-14 DIAGNOSIS — E119 Type 2 diabetes mellitus without complications: Secondary | ICD-10-CM | POA: Diagnosis not present

## 2014-05-16 DIAGNOSIS — E119 Type 2 diabetes mellitus without complications: Secondary | ICD-10-CM | POA: Diagnosis not present

## 2014-05-16 DIAGNOSIS — Z794 Long term (current) use of insulin: Secondary | ICD-10-CM | POA: Diagnosis not present

## 2014-05-16 DIAGNOSIS — J4550 Severe persistent asthma, uncomplicated: Secondary | ICD-10-CM | POA: Diagnosis not present

## 2014-05-16 DIAGNOSIS — N183 Chronic kidney disease, stage 3 (moderate): Secondary | ICD-10-CM | POA: Diagnosis not present

## 2014-05-16 DIAGNOSIS — E785 Hyperlipidemia, unspecified: Secondary | ICD-10-CM | POA: Diagnosis not present

## 2014-05-16 DIAGNOSIS — E669 Obesity, unspecified: Secondary | ICD-10-CM | POA: Diagnosis not present

## 2014-05-16 DIAGNOSIS — I129 Hypertensive chronic kidney disease with stage 1 through stage 4 chronic kidney disease, or unspecified chronic kidney disease: Secondary | ICD-10-CM | POA: Diagnosis not present

## 2014-05-20 DIAGNOSIS — Z794 Long term (current) use of insulin: Secondary | ICD-10-CM | POA: Diagnosis not present

## 2014-05-20 DIAGNOSIS — E119 Type 2 diabetes mellitus without complications: Secondary | ICD-10-CM | POA: Diagnosis not present

## 2014-05-20 DIAGNOSIS — E785 Hyperlipidemia, unspecified: Secondary | ICD-10-CM | POA: Diagnosis not present

## 2014-05-20 DIAGNOSIS — E669 Obesity, unspecified: Secondary | ICD-10-CM | POA: Diagnosis not present

## 2014-05-20 DIAGNOSIS — I129 Hypertensive chronic kidney disease with stage 1 through stage 4 chronic kidney disease, or unspecified chronic kidney disease: Secondary | ICD-10-CM | POA: Diagnosis not present

## 2014-05-20 DIAGNOSIS — J4550 Severe persistent asthma, uncomplicated: Secondary | ICD-10-CM | POA: Diagnosis not present

## 2014-05-20 DIAGNOSIS — N183 Chronic kidney disease, stage 3 (moderate): Secondary | ICD-10-CM | POA: Diagnosis not present

## 2014-05-22 DIAGNOSIS — Z794 Long term (current) use of insulin: Secondary | ICD-10-CM | POA: Diagnosis not present

## 2014-05-22 DIAGNOSIS — E119 Type 2 diabetes mellitus without complications: Secondary | ICD-10-CM | POA: Diagnosis not present

## 2014-05-22 DIAGNOSIS — E669 Obesity, unspecified: Secondary | ICD-10-CM | POA: Diagnosis not present

## 2014-05-22 DIAGNOSIS — N183 Chronic kidney disease, stage 3 (moderate): Secondary | ICD-10-CM | POA: Diagnosis not present

## 2014-05-22 DIAGNOSIS — E785 Hyperlipidemia, unspecified: Secondary | ICD-10-CM | POA: Diagnosis not present

## 2014-05-22 DIAGNOSIS — I129 Hypertensive chronic kidney disease with stage 1 through stage 4 chronic kidney disease, or unspecified chronic kidney disease: Secondary | ICD-10-CM | POA: Diagnosis not present

## 2014-05-22 DIAGNOSIS — J4550 Severe persistent asthma, uncomplicated: Secondary | ICD-10-CM | POA: Diagnosis not present

## 2014-05-28 DIAGNOSIS — I129 Hypertensive chronic kidney disease with stage 1 through stage 4 chronic kidney disease, or unspecified chronic kidney disease: Secondary | ICD-10-CM | POA: Diagnosis not present

## 2014-05-28 DIAGNOSIS — N183 Chronic kidney disease, stage 3 (moderate): Secondary | ICD-10-CM | POA: Diagnosis not present

## 2014-05-28 DIAGNOSIS — E119 Type 2 diabetes mellitus without complications: Secondary | ICD-10-CM | POA: Diagnosis not present

## 2014-05-28 DIAGNOSIS — E669 Obesity, unspecified: Secondary | ICD-10-CM | POA: Diagnosis not present

## 2014-05-28 DIAGNOSIS — Z794 Long term (current) use of insulin: Secondary | ICD-10-CM | POA: Diagnosis not present

## 2014-05-28 DIAGNOSIS — J4550 Severe persistent asthma, uncomplicated: Secondary | ICD-10-CM | POA: Diagnosis not present

## 2014-05-28 DIAGNOSIS — E785 Hyperlipidemia, unspecified: Secondary | ICD-10-CM | POA: Diagnosis not present

## 2014-06-11 DIAGNOSIS — J9601 Acute respiratory failure with hypoxia: Secondary | ICD-10-CM | POA: Diagnosis not present

## 2014-06-25 DIAGNOSIS — L110 Acquired keratosis follicularis: Secondary | ICD-10-CM | POA: Diagnosis not present

## 2014-06-25 DIAGNOSIS — E1140 Type 2 diabetes mellitus with diabetic neuropathy, unspecified: Secondary | ICD-10-CM | POA: Diagnosis not present

## 2014-06-25 DIAGNOSIS — L609 Nail disorder, unspecified: Secondary | ICD-10-CM | POA: Diagnosis not present

## 2014-07-09 DIAGNOSIS — Z0000 Encounter for general adult medical examination without abnormal findings: Secondary | ICD-10-CM | POA: Diagnosis not present

## 2014-07-09 DIAGNOSIS — R0902 Hypoxemia: Secondary | ICD-10-CM | POA: Diagnosis not present

## 2014-07-09 DIAGNOSIS — E119 Type 2 diabetes mellitus without complications: Secondary | ICD-10-CM | POA: Diagnosis not present

## 2014-07-09 DIAGNOSIS — I10 Essential (primary) hypertension: Secondary | ICD-10-CM | POA: Diagnosis not present

## 2014-07-09 DIAGNOSIS — E161 Other hypoglycemia: Secondary | ICD-10-CM | POA: Diagnosis not present

## 2014-07-11 DIAGNOSIS — J9601 Acute respiratory failure with hypoxia: Secondary | ICD-10-CM | POA: Diagnosis not present

## 2014-07-18 DIAGNOSIS — R809 Proteinuria, unspecified: Secondary | ICD-10-CM | POA: Diagnosis not present

## 2014-07-18 DIAGNOSIS — Z79899 Other long term (current) drug therapy: Secondary | ICD-10-CM | POA: Diagnosis not present

## 2014-07-18 DIAGNOSIS — I10 Essential (primary) hypertension: Secondary | ICD-10-CM | POA: Diagnosis not present

## 2014-07-18 DIAGNOSIS — D649 Anemia, unspecified: Secondary | ICD-10-CM | POA: Diagnosis not present

## 2014-07-18 DIAGNOSIS — N183 Chronic kidney disease, stage 3 (moderate): Secondary | ICD-10-CM | POA: Diagnosis not present

## 2014-07-22 DIAGNOSIS — H4313 Vitreous hemorrhage, bilateral: Secondary | ICD-10-CM | POA: Diagnosis not present

## 2014-07-22 DIAGNOSIS — H3582 Retinal ischemia: Secondary | ICD-10-CM | POA: Diagnosis not present

## 2014-07-22 DIAGNOSIS — E10359 Type 1 diabetes mellitus with proliferative diabetic retinopathy without macular edema: Secondary | ICD-10-CM | POA: Diagnosis not present

## 2014-07-23 DIAGNOSIS — N182 Chronic kidney disease, stage 2 (mild): Secondary | ICD-10-CM | POA: Diagnosis not present

## 2014-07-23 DIAGNOSIS — N179 Acute kidney failure, unspecified: Secondary | ICD-10-CM | POA: Diagnosis not present

## 2014-07-23 DIAGNOSIS — D649 Anemia, unspecified: Secondary | ICD-10-CM | POA: Diagnosis not present

## 2014-07-23 DIAGNOSIS — R809 Proteinuria, unspecified: Secondary | ICD-10-CM | POA: Diagnosis not present

## 2014-08-11 DIAGNOSIS — J9601 Acute respiratory failure with hypoxia: Secondary | ICD-10-CM | POA: Diagnosis not present

## 2014-08-12 DIAGNOSIS — J45998 Other asthma: Secondary | ICD-10-CM | POA: Diagnosis not present

## 2014-08-12 DIAGNOSIS — E119 Type 2 diabetes mellitus without complications: Secondary | ICD-10-CM | POA: Diagnosis not present

## 2014-08-15 DIAGNOSIS — N183 Chronic kidney disease, stage 3 (moderate): Secondary | ICD-10-CM | POA: Diagnosis not present

## 2014-08-15 DIAGNOSIS — I10 Essential (primary) hypertension: Secondary | ICD-10-CM | POA: Diagnosis not present

## 2014-08-15 DIAGNOSIS — Z79899 Other long term (current) drug therapy: Secondary | ICD-10-CM | POA: Diagnosis not present

## 2014-08-15 DIAGNOSIS — E1149 Type 2 diabetes mellitus with other diabetic neurological complication: Secondary | ICD-10-CM | POA: Diagnosis not present

## 2014-08-22 DIAGNOSIS — E10359 Type 1 diabetes mellitus with proliferative diabetic retinopathy without macular edema: Secondary | ICD-10-CM | POA: Diagnosis not present

## 2014-09-03 DIAGNOSIS — L609 Nail disorder, unspecified: Secondary | ICD-10-CM | POA: Diagnosis not present

## 2014-09-03 DIAGNOSIS — L110 Acquired keratosis follicularis: Secondary | ICD-10-CM | POA: Diagnosis not present

## 2014-09-03 DIAGNOSIS — E1140 Type 2 diabetes mellitus with diabetic neuropathy, unspecified: Secondary | ICD-10-CM | POA: Diagnosis not present

## 2014-09-09 DIAGNOSIS — E875 Hyperkalemia: Secondary | ICD-10-CM | POA: Diagnosis not present

## 2014-09-10 DIAGNOSIS — J9601 Acute respiratory failure with hypoxia: Secondary | ICD-10-CM | POA: Diagnosis not present

## 2014-09-24 DIAGNOSIS — E119 Type 2 diabetes mellitus without complications: Secondary | ICD-10-CM | POA: Diagnosis not present

## 2014-09-24 DIAGNOSIS — J45909 Unspecified asthma, uncomplicated: Secondary | ICD-10-CM | POA: Diagnosis not present

## 2014-09-26 DIAGNOSIS — E10359 Type 1 diabetes mellitus with proliferative diabetic retinopathy without macular edema: Secondary | ICD-10-CM | POA: Diagnosis not present

## 2014-10-11 DIAGNOSIS — J9601 Acute respiratory failure with hypoxia: Secondary | ICD-10-CM | POA: Diagnosis not present

## 2014-10-14 ENCOUNTER — Other Ambulatory Visit (HOSPITAL_COMMUNITY): Payer: Self-pay | Admitting: Family Medicine

## 2014-10-14 DIAGNOSIS — Z1231 Encounter for screening mammogram for malignant neoplasm of breast: Secondary | ICD-10-CM

## 2014-10-14 DIAGNOSIS — R229 Localized swelling, mass and lump, unspecified: Secondary | ICD-10-CM

## 2014-10-14 DIAGNOSIS — IMO0002 Reserved for concepts with insufficient information to code with codable children: Secondary | ICD-10-CM

## 2014-10-16 ENCOUNTER — Encounter (HOSPITAL_COMMUNITY): Payer: Self-pay | Admitting: Emergency Medicine

## 2014-10-16 ENCOUNTER — Emergency Department (HOSPITAL_COMMUNITY)
Admission: EM | Admit: 2014-10-16 | Discharge: 2014-10-16 | Disposition: A | Discharge status: Home / Self Care | Payer: Medicare Other | Attending: Emergency Medicine | Admitting: Emergency Medicine

## 2014-10-16 DIAGNOSIS — J45909 Unspecified asthma, uncomplicated: Secondary | ICD-10-CM | POA: Diagnosis not present

## 2014-10-16 DIAGNOSIS — I10 Essential (primary) hypertension: Secondary | ICD-10-CM | POA: Insufficient documentation

## 2014-10-16 DIAGNOSIS — Z79899 Other long term (current) drug therapy: Secondary | ICD-10-CM | POA: Diagnosis not present

## 2014-10-16 DIAGNOSIS — Z794 Long term (current) use of insulin: Secondary | ICD-10-CM | POA: Diagnosis not present

## 2014-10-16 DIAGNOSIS — E785 Hyperlipidemia, unspecified: Secondary | ICD-10-CM | POA: Diagnosis not present

## 2014-10-16 DIAGNOSIS — Z7982 Long term (current) use of aspirin: Secondary | ICD-10-CM | POA: Diagnosis not present

## 2014-10-16 DIAGNOSIS — E119 Type 2 diabetes mellitus without complications: Secondary | ICD-10-CM | POA: Insufficient documentation

## 2014-10-16 DIAGNOSIS — L0291 Cutaneous abscess, unspecified: Secondary | ICD-10-CM

## 2014-10-16 DIAGNOSIS — L02213 Cutaneous abscess of chest wall: Secondary | ICD-10-CM | POA: Insufficient documentation

## 2014-10-16 LAB — CBG MONITORING, ED: Glucose-Capillary: 324 mg/dL — ABNORMAL HIGH (ref 65–99)

## 2014-10-16 MED ORDER — HYDROCODONE-ACETAMINOPHEN 5-325 MG PO TABS: 2.0000 | ORAL_TABLET | ORAL | Status: DC | PRN

## 2014-10-16 MED ORDER — CLINDAMYCIN HCL 150 MG PO CAPS
300.0000 mg | ORAL_CAPSULE | Freq: Once | ORAL | Status: AC
  Administered 2014-10-16: 300 mg via ORAL
  Filled 2014-10-16: qty 2

## 2014-10-16 MED ORDER — LIDOCAINE-EPINEPHRINE (PF) 1 %-1:200000 IJ SOLN
10.0000 mL | Freq: Once | INTRAMUSCULAR | Status: DC
  Filled 2014-10-16: qty 10

## 2014-10-16 MED ORDER — CLINDAMYCIN HCL 300 MG PO CAPS: 300.0000 mg | ORAL_CAPSULE | Freq: Four times a day (QID) | ORAL | Status: DC

## 2014-10-16 NOTE — ED Provider Notes (Signed)
CSN: [id number]     Arrival date & time 10/16/14  1624 History   First MD Initiated Contact with Patient 10/16/14 1633     Chief Complaint  Patient presents with  . Abscess     (Consider location/radiation/quality/duration/timing/severity/associated sxs/prior Treatment) Patient is a 49 y.o. female presenting with abscess.  Abscess Abscess location: between breasts. Size:  3 cm Abscess quality: draining, induration, painful, redness and warmth   Red streaking: no   Duration:  4 days Progression:  Partially resolved Pain details:    Quality:  Pressure and dull   Severity:  Mild   Duration:  4 days   Timing:  Constant   Progression:  Worsening Chronicity:  New Relieved by:  None tried Worsened by:  Nothing tried Ineffective treatments:  None tried Associated symptoms: no anorexia, no fatigue, no fever, no headaches, no nausea and no vomiting     Past Medical History  Diagnosis Date  . Asthma   . Hypertension   . Diabetes mellitus   . Hyperlipidemia    Past Surgical History  Procedure Laterality Date  . Tracheostomy      at age 13 from asthma attack  . Cataract extraction w/phaco Right 12/10/2012    Procedure: CATARACT EXTRACTION PHACO AND INTRAOCULAR LENS PLACEMENT (IOC);  Surgeon: Tonny Branch, MD;  Location: [facility];  Service: Ophthalmology;  Laterality: Right;  CDE:22..42  . Left heart catheterization with coronary angiogram N/A 04/09/2013    Procedure: LEFT HEART CATHETERIZATION WITH CORONARY ANGIOGRAM;  Surgeon: Laverda Page, MD;  Location: [facility] CATH LAB;  Service: Cardiovascular;  Laterality: N/A;  . Cardiac catheterization  03/2013    normal coronary arteries, EF 55%  . Eye surgery Left    Family History  Problem Relation Age of Onset  . Diabetes Mother    Social History  Substance Use Topics  . Smoking status: Never Smoker   . Smokeless tobacco: Never Used  . Alcohol Use: Yes     Comment: occasional   OB History    Gravida Para Term Preterm AB TAB SAB  Ectopic Multiple Living            0     Review of Systems  Constitutional: Negative for fever and fatigue.  Gastrointestinal: Negative for nausea, vomiting and anorexia.  Skin: Positive for wound (abscess between breasts).  Neurological: Negative for headaches.  All other systems reviewed and are negative.     Allergies  Review of patient's allergies indicates no known allergies.  Home Medications   Prior to Admission medications   Medication Sig Start Date End Date Taking? Authorizing Provider  Alogliptin-Pioglitazone (OSENI) 25-15 MG TABS Take 1 tablet by mouth daily.   Yes Historical Provider, MD  aspirin EC 81 MG tablet Take 81 mg by mouth daily.    Yes Historical Provider, MD  atorvastatin (LIPITOR) 20 MG tablet Take 20 mg by mouth at bedtime.    Yes Historical Provider, MD  carvedilol (COREG) 6.25 MG tablet Take 6.25 mg by mouth 2 (two) times daily.  11/08/13  Yes Historical Provider, MD  Fluticasone-Salmeterol (ADVAIR) 100-50 MCG/DOSE AEPB Inhale 1 puff into the lungs every 12 (twelve) hours as needed (for shortness of breath).    Yes Historical Provider, MD  gabapentin (NEURONTIN) 300 MG capsule Take 300 mg by mouth 3 (three) times daily.    Yes Historical Provider, MD  Insulin Isophane & Regular Human (HUMULIN 70/30 KWIKPEN) (70-30) 100 UNIT/ML PEN Inject 48 Units into the skin 2 (two) times  daily.   Yes Historical Provider, MD  metFORMIN (GLUCOPHAGE) 1000 MG tablet Take 1 tablet (1,000 mg total) by mouth 2 (two) times daily with a meal. 04/10/13  Yes Adrian Prows, MD  montelukast (SINGULAIR) 10 MG tablet Take 10 mg by mouth daily.    Yes Historical Provider, MD  torsemide (DEMADEX) 20 MG tablet Take 2 tablets (40 mg total) by mouth daily. 05/11/14  Yes Kathie Dike, MD  albuterol (PROVENTIL HFA;VENTOLIN HFA) 108 (90 BASE) MCG/ACT inhaler Inhale 2 puffs into the lungs every 6 (six) hours as needed for wheezing.    Historical Provider, MD  albuterol (PROVENTIL) (2.5 MG/3ML)  0.083% nebulizer solution Take 2.5 mg by nebulization every 4 (four) hours as needed for wheezing or shortness of breath.    Historical Provider, MD  benzonatate (TESSALON) 100 MG capsule Take 1 capsule (100 mg total) by mouth 3 (three) times daily as needed for cough. Patient not taking: Reported on 10/16/2014 02/06/14   Francine Graven, DO  clindamycin (CLEOCIN) 300 MG capsule Take 1 capsule (300 mg total) by mouth 4 (four) times daily. X 7 days 10/16/14   Merrily Pew, MD  HYDROcodone-acetaminophen [facility]) 5-325 MG per tablet Take 2 tablets by mouth every 4 (four) hours as needed. 10/16/14   Merrily Pew, MD  nitroGLYCERIN (NITROSTAT) 0.4 MG SL tablet Place 0.4 mg under the tongue every 5 (five) minutes as needed for chest pain.    Historical Provider, MD  predniSONE (DELTASONE) 10 MG tablet Take 40mg  po daily for 2 days then 30mg  po daily for 2 days then 20mg  po daily for 2 days then 10mg  po daily for 2 days then stop Patient not taking: Reported on 10/16/2014 05/11/14   Kathie Dike, MD  tetrahydrozoline 0.05 % ophthalmic solution Place 1 drop into both eyes daily as needed. Dry, Itchy Eyes    Historical Provider, MD   BP 98/78 mmHg  Pulse 77  Temp(Src) 98.6 F (37 C) (Oral)  Resp 18  Ht 5' (1.524 m)  Wt 342 lb (155.13 kg)  BMI 66.79 kg/m2  SpO2 97% Physical Exam  Constitutional: She is oriented to person, place, and time. She appears well-developed and well-nourished.  HENT:  Head: Normocephalic and atraumatic.  Eyes: Conjunctivae and EOM are normal. Right eye exhibits no discharge. Left eye exhibits no discharge.  Cardiovascular: Normal rate and regular rhythm.   Pulmonary/Chest: Effort normal and breath sounds normal. No respiratory distress.  Abdominal: Soft. She exhibits no distension. There is no tenderness. There is no rebound.  Musculoskeletal: Normal range of motion. She exhibits no edema or tenderness.  Neurological: She is alert and oriented to person, place, and time.   Skin: Skin is warm and dry. Rash noted. There is erythema (4 cm surrounding chest abscess with induration).  Nursing note and vitals reviewed.   ED Course  INCISION AND DRAINAGE Date/Time: 10/16/2014 5:53 PM Performed by: Merrily Pew Authorized by: Merrily Pew Consent: Verbal consent obtained. Risks and benefits: risks, benefits and alternatives were discussed Consent given by: patient Patient understanding: patient states understanding of the procedure being performed Type: abscess Body area: trunk Location details: chest Anesthesia: local infiltration Local anesthetic: lidocaine 1% with epinephrine Anesthetic total: 2 ml Scalpel size: 11 Incision type: elliptical Incision depth: dermal Complexity: simple Drainage: purulent and  bloody Drainage amount: moderate Wound treatment: wound left open Packing material: none Patient tolerance: Patient tolerated the procedure well with no immediate complications   (including critical care time) Labs Review Labs Reviewed  CBG  MONITORING, ED - Abnormal; Notable for the following:    Glucose-Capillary 324 (*)    All other components within normal limits    Imaging Review No results found. I have personally reviewed and evaluated these images and lab results as part of my medical decision-making.   EKG Interpretation None      MDM   Final diagnoses:  Abscess    Uncomplicated abscess on anterior chest as noted above. I&D as above. No obvious loculations. Surrounding induration concerning for possible cellulitis so started her antibiotics as well. Especially with her history of diabetes went to ensure this is not sure into a systemic infection. He'll follow up with her primary doctor to ensure resolution symptoms approximately one week.  I have personally and contemperaneously reviewed labs and imaging and used in my decision making as above.   A medical screening exam was performed and I feel the patient has had an  appropriate workup for their chief complaint at this time and likelihood of emergent condition existing is low. They have been counseled on decision, discharge, follow up and which symptoms necessitate immediate return to the emergency department. They or their family verbally stated understanding and agreement with plan and discharged in stable condition.      Merrily Pew, MD 10/16/14 [phone]

## 2014-10-16 NOTE — ED Notes (Signed)
Pt states that she has an abscess between her breasts.

## 2014-10-28 ENCOUNTER — Ambulatory Visit (HOSPITAL_COMMUNITY)
Admission: RE | Admit: 2014-10-28 | Discharge: 2014-10-28 | Disposition: A | Discharge status: Home / Self Care | Payer: Medicare Other | Source: Ambulatory Visit | Attending: Family Medicine | Admitting: Family Medicine

## 2014-10-28 DIAGNOSIS — N63 Unspecified lump in breast: Secondary | ICD-10-CM | POA: Insufficient documentation

## 2014-10-28 DIAGNOSIS — R229 Localized swelling, mass and lump, unspecified: Secondary | ICD-10-CM

## 2014-10-28 DIAGNOSIS — IMO0002 Reserved for concepts with insufficient information to code with codable children: Secondary | ICD-10-CM

## 2014-10-28 DIAGNOSIS — R928 Other abnormal and inconclusive findings on diagnostic imaging of breast: Secondary | ICD-10-CM | POA: Diagnosis not present

## 2014-10-31 DIAGNOSIS — N183 Chronic kidney disease, stage 3 (moderate): Secondary | ICD-10-CM | POA: Diagnosis not present

## 2014-10-31 DIAGNOSIS — Z79899 Other long term (current) drug therapy: Secondary | ICD-10-CM | POA: Diagnosis not present

## 2014-10-31 DIAGNOSIS — R809 Proteinuria, unspecified: Secondary | ICD-10-CM | POA: Diagnosis not present

## 2014-10-31 DIAGNOSIS — D509 Iron deficiency anemia, unspecified: Secondary | ICD-10-CM | POA: Diagnosis not present

## 2014-10-31 DIAGNOSIS — E559 Vitamin D deficiency, unspecified: Secondary | ICD-10-CM | POA: Diagnosis not present

## 2014-10-31 DIAGNOSIS — I10 Essential (primary) hypertension: Secondary | ICD-10-CM | POA: Diagnosis not present

## 2014-11-05 DIAGNOSIS — E875 Hyperkalemia: Secondary | ICD-10-CM | POA: Diagnosis not present

## 2014-11-05 DIAGNOSIS — I10 Essential (primary) hypertension: Secondary | ICD-10-CM | POA: Diagnosis not present

## 2014-11-05 DIAGNOSIS — E559 Vitamin D deficiency, unspecified: Secondary | ICD-10-CM | POA: Diagnosis not present

## 2014-11-05 DIAGNOSIS — N183 Chronic kidney disease, stage 3 (moderate): Secondary | ICD-10-CM | POA: Diagnosis not present

## 2014-11-05 DIAGNOSIS — R809 Proteinuria, unspecified: Secondary | ICD-10-CM | POA: Diagnosis not present

## 2014-11-11 DIAGNOSIS — J9601 Acute respiratory failure with hypoxia: Secondary | ICD-10-CM | POA: Diagnosis not present

## 2014-11-13 DIAGNOSIS — L609 Nail disorder, unspecified: Secondary | ICD-10-CM | POA: Diagnosis not present

## 2014-11-13 DIAGNOSIS — E1140 Type 2 diabetes mellitus with diabetic neuropathy, unspecified: Secondary | ICD-10-CM | POA: Diagnosis not present

## 2014-11-13 DIAGNOSIS — L110 Acquired keratosis follicularis: Secondary | ICD-10-CM | POA: Diagnosis not present

## 2014-11-19 DIAGNOSIS — E10359 Type 1 diabetes mellitus with proliferative diabetic retinopathy without macular edema: Secondary | ICD-10-CM | POA: Diagnosis not present

## 2014-12-02 ENCOUNTER — Inpatient Hospital Stay (HOSPITAL_COMMUNITY)
Admission: EM | Admit: 2014-12-02 | Discharge: 2014-12-05 | DRG: 683 | Disposition: A | Discharge status: Home / Self Care | Payer: Medicare Other | Attending: Internal Medicine | Admitting: Internal Medicine

## 2014-12-02 ENCOUNTER — Encounter (HOSPITAL_COMMUNITY): Payer: Self-pay | Admitting: Emergency Medicine

## 2014-12-02 ENCOUNTER — Emergency Department (HOSPITAL_COMMUNITY): Payer: Medicare Other

## 2014-12-02 DIAGNOSIS — N179 Acute kidney failure, unspecified: Secondary | ICD-10-CM

## 2014-12-02 DIAGNOSIS — Z833 Family history of diabetes mellitus: Secondary | ICD-10-CM

## 2014-12-02 DIAGNOSIS — Z794 Long term (current) use of insulin: Secondary | ICD-10-CM | POA: Diagnosis not present

## 2014-12-02 DIAGNOSIS — J189 Pneumonia, unspecified organism: Secondary | ICD-10-CM

## 2014-12-02 DIAGNOSIS — I129 Hypertensive chronic kidney disease with stage 1 through stage 4 chronic kidney disease, or unspecified chronic kidney disease: Secondary | ICD-10-CM | POA: Diagnosis not present

## 2014-12-02 DIAGNOSIS — J45909 Unspecified asthma, uncomplicated: Secondary | ICD-10-CM | POA: Diagnosis not present

## 2014-12-02 DIAGNOSIS — N183 Chronic kidney disease, stage 3 (moderate): Secondary | ICD-10-CM | POA: Diagnosis not present

## 2014-12-02 DIAGNOSIS — E871 Hypo-osmolality and hyponatremia: Secondary | ICD-10-CM | POA: Diagnosis present

## 2014-12-02 DIAGNOSIS — E861 Hypovolemia: Secondary | ICD-10-CM | POA: Diagnosis present

## 2014-12-02 DIAGNOSIS — T501X5A Adverse effect of loop [high-ceiling] diuretics, initial encounter: Secondary | ICD-10-CM | POA: Diagnosis not present

## 2014-12-02 DIAGNOSIS — R0602 Shortness of breath: Secondary | ICD-10-CM | POA: Diagnosis not present

## 2014-12-02 DIAGNOSIS — I10 Essential (primary) hypertension: Secondary | ICD-10-CM

## 2014-12-02 DIAGNOSIS — D649 Anemia, unspecified: Secondary | ICD-10-CM | POA: Diagnosis not present

## 2014-12-02 DIAGNOSIS — E1169 Type 2 diabetes mellitus with other specified complication: Secondary | ICD-10-CM

## 2014-12-02 DIAGNOSIS — R739 Hyperglycemia, unspecified: Secondary | ICD-10-CM

## 2014-12-02 DIAGNOSIS — E669 Obesity, unspecified: Secondary | ICD-10-CM | POA: Diagnosis not present

## 2014-12-02 DIAGNOSIS — E1122 Type 2 diabetes mellitus with diabetic chronic kidney disease: Secondary | ICD-10-CM | POA: Diagnosis present

## 2014-12-02 DIAGNOSIS — E785 Hyperlipidemia, unspecified: Secondary | ICD-10-CM | POA: Diagnosis present

## 2014-12-02 DIAGNOSIS — J45901 Unspecified asthma with (acute) exacerbation: Secondary | ICD-10-CM | POA: Diagnosis not present

## 2014-12-02 DIAGNOSIS — I959 Hypotension, unspecified: Secondary | ICD-10-CM | POA: Diagnosis present

## 2014-12-02 DIAGNOSIS — Z6841 Body Mass Index (BMI) 40.0 and over, adult: Secondary | ICD-10-CM | POA: Diagnosis not present

## 2014-12-02 DIAGNOSIS — E1165 Type 2 diabetes mellitus with hyperglycemia: Secondary | ICD-10-CM | POA: Diagnosis not present

## 2014-12-02 DIAGNOSIS — E1142 Type 2 diabetes mellitus with diabetic polyneuropathy: Secondary | ICD-10-CM | POA: Diagnosis not present

## 2014-12-02 DIAGNOSIS — E119 Type 2 diabetes mellitus without complications: Secondary | ICD-10-CM | POA: Diagnosis not present

## 2014-12-02 DIAGNOSIS — N170 Acute kidney failure with tubular necrosis: Secondary | ICD-10-CM

## 2014-12-02 HISTORY — DX: Personal history of other medical treatment: Z92.89

## 2014-12-02 HISTORY — DX: Hypo-osmolality and hyponatremia: E87.1

## 2014-12-02 LAB — URINALYSIS, ROUTINE W REFLEX MICROSCOPIC
Bilirubin Urine: NEGATIVE
Glucose, UA: NEGATIVE mg/dL
HGB URINE DIPSTICK: NEGATIVE
Nitrite: NEGATIVE
PROTEIN: NEGATIVE mg/dL
Specific Gravity, Urine: 1.025 (ref 1.005–1.030)
Urobilinogen, UA: 0.2 mg/dL (ref 0.0–1.0)
pH: 5.5 (ref 5.0–8.0)

## 2014-12-02 LAB — URINE MICROSCOPIC-ADD ON

## 2014-12-02 LAB — GLUCOSE, CAPILLARY: Glucose-Capillary: 220 mg/dL — ABNORMAL HIGH (ref 65–99)

## 2014-12-02 LAB — CBC WITH DIFFERENTIAL/PLATELET
BASOS ABS: 0 10*3/uL (ref 0.0–0.1)
Basophils Relative: 0 %
Eosinophils Absolute: 0.1 10*3/uL (ref 0.0–0.7)
Eosinophils Relative: 1 %
HEMATOCRIT: 37.7 % (ref 36.0–46.0)
HEMOGLOBIN: 12.7 g/dL (ref 12.0–15.0)
Lymphocytes Relative: 27 %
Lymphs Abs: 2.7 10*3/uL (ref 0.7–4.0)
MCH: 27.1 pg (ref 26.0–34.0)
MCHC: 33.7 g/dL (ref 30.0–36.0)
MCV: 80.6 fL (ref 78.0–100.0)
Monocytes Absolute: 0.6 10*3/uL (ref 0.1–1.0)
Monocytes Relative: 6 %
NEUTROS PCT: 66 %
Neutro Abs: 6.7 10*3/uL (ref 1.7–7.7)
Platelets: 157 10*3/uL (ref 150–400)
RBC: 4.68 MIL/uL (ref 3.87–5.11)
RDW: 13.1 % (ref 11.5–15.5)
WBC: 10.2 10*3/uL (ref 4.0–10.5)

## 2014-12-02 LAB — CBG MONITORING, ED
Glucose-Capillary: 442 mg/dL — ABNORMAL HIGH (ref 65–99)
Glucose-Capillary: 301 mg/dL — ABNORMAL HIGH (ref 65–99)

## 2014-12-02 LAB — BLOOD GAS, ARTERIAL
Acid-Base Excess: 2.3 mmol/L — ABNORMAL HIGH (ref 0.0–2.0)
Bicarbonate: 26.2 mEq/L — ABNORMAL HIGH (ref 20.0–24.0)
Drawn by: 382351
FIO2: 0.21
O2 Saturation: 95.8 %
PCO2 ART: 42.4 mmHg (ref 35.0–45.0)
PH ART: 7.412 (ref 7.350–7.450)
PO2 ART: 80.4 mmHg (ref 80.0–100.0)
Patient temperature: 37

## 2014-12-02 LAB — MAGNESIUM: Magnesium: 2.1 mg/dL (ref 1.7–2.4)

## 2014-12-02 LAB — PREGNANCY, URINE: Preg Test, Ur: NEGATIVE

## 2014-12-02 LAB — I-STAT CHEM 8, ED
BUN: 80 mg/dL — ABNORMAL HIGH (ref 6–20)
CHLORIDE: 88 mmol/L — AB (ref 101–111)
CREATININE: 4.1 mg/dL — AB (ref 0.44–1.00)
Calcium, Ion: 1.2 mmol/L (ref 1.12–1.23)
GLUCOSE: 355 mg/dL — AB (ref 65–99)
HEMATOCRIT: 44 % (ref 36.0–46.0)
Hemoglobin: 15 g/dL (ref 12.0–15.0)
Potassium: 4.6 mmol/L (ref 3.5–5.1)
Sodium: 129 mmol/L — ABNORMAL LOW (ref 135–145)
TCO2: 29 mmol/L (ref 0–100)

## 2014-12-02 LAB — PHOSPHORUS: Phosphorus: 5.4 mg/dL — ABNORMAL HIGH (ref 2.5–4.6)

## 2014-12-02 LAB — COMPREHENSIVE METABOLIC PANEL
ALBUMIN: 4 g/dL (ref 3.5–5.0)
ALK PHOS: 86 U/L (ref 38–126)
ALT: 25 U/L (ref 14–54)
ANION GAP: 12 (ref 5–15)
AST: 22 U/L (ref 15–41)
BUN: 88 mg/dL — ABNORMAL HIGH (ref 6–20)
CALCIUM: 9.5 mg/dL (ref 8.9–10.3)
CO2: 30 mmol/L (ref 22–32)
Chloride: 87 mmol/L — ABNORMAL LOW (ref 101–111)
Creatinine, Ser: 4.52 mg/dL — ABNORMAL HIGH (ref 0.44–1.00)
GFR calc Af Amer: 12 mL/min — ABNORMAL LOW (ref >60)
GFR calc non Af Amer: 11 mL/min — ABNORMAL LOW (ref >60)
GLUCOSE: 352 mg/dL — AB (ref 65–99)
POTASSIUM: 4.7 mmol/L (ref 3.5–5.1)
SODIUM: 129 mmol/L — AB (ref 135–145)
Total Bilirubin: 0.4 mg/dL (ref 0.3–1.2)
Total Protein: 7.9 g/dL (ref 6.5–8.1)

## 2014-12-02 LAB — I-STAT TROPONIN, ED: Troponin i, poc: 0.01 ng/mL (ref 0.00–0.08)

## 2014-12-02 MED ORDER — SODIUM CHLORIDE 0.9 % IV SOLN: INTRAVENOUS | Status: DC

## 2014-12-02 MED ORDER — IPRATROPIUM-ALBUTEROL 0.5-2.5 (3) MG/3ML IN SOLN
3.0000 mL | Freq: Once | RESPIRATORY_TRACT | Status: AC
  Administered 2014-12-02: 3 mL via RESPIRATORY_TRACT
  Filled 2014-12-02: qty 3

## 2014-12-02 MED ORDER — IPRATROPIUM-ALBUTEROL 0.5-2.5 (3) MG/3ML IN SOLN
3.0000 mL | RESPIRATORY_TRACT | Status: DC
  Administered 2014-12-02: 3 mL via RESPIRATORY_TRACT
  Filled 2014-12-02: qty 3

## 2014-12-02 MED ORDER — ONDANSETRON HCL 4 MG PO TABS
4.0000 mg | ORAL_TABLET | Freq: Four times a day (QID) | ORAL | Status: DC | PRN
Start: 2014-12-02 — End: 2014-12-05

## 2014-12-02 MED ORDER — PREDNISONE 20 MG PO TABS
60.0000 mg | ORAL_TABLET | Freq: Every day | ORAL | Status: DC
  Administered 2014-12-03: 60 mg via ORAL
  Filled 2014-12-02: qty 3

## 2014-12-02 MED ORDER — SODIUM CHLORIDE 0.9 % IV BOLUS (SEPSIS)
500.0000 mL | Freq: Once | INTRAVENOUS | Status: AC
  Administered 2014-12-02: 500 mL via INTRAVENOUS

## 2014-12-02 MED ORDER — INSULIN ASPART 100 UNIT/ML ~~LOC~~ SOLN
0.0000 [IU] | Freq: Every day | SUBCUTANEOUS | Status: DC
  Administered 2014-12-02: 2 [IU] via SUBCUTANEOUS

## 2014-12-02 MED ORDER — MONTELUKAST SODIUM 10 MG PO TABS
10.0000 mg | ORAL_TABLET | Freq: Every day | ORAL | Status: DC
  Administered 2014-12-03 – 2014-12-05 (×3): 10 mg via ORAL
  Filled 2014-12-02 (×3): qty 1

## 2014-12-02 MED ORDER — IPRATROPIUM-ALBUTEROL 0.5-2.5 (3) MG/3ML IN SOLN
3.0000 mL | RESPIRATORY_TRACT | Status: DC | PRN
  Administered 2014-12-05: 3 mL via RESPIRATORY_TRACT
  Filled 2014-12-02: qty 3

## 2014-12-02 MED ORDER — ACETAMINOPHEN 325 MG PO TABS
650.0000 mg | ORAL_TABLET | Freq: Four times a day (QID) | ORAL | Status: DC | PRN
  Administered 2014-12-03: 650 mg via ORAL
  Filled 2014-12-02: qty 2

## 2014-12-02 MED ORDER — CARVEDILOL 3.125 MG PO TABS: 6.2500 mg | ORAL_TABLET | Freq: Two times a day (BID) | ORAL | Status: DC

## 2014-12-02 MED ORDER — ATORVASTATIN CALCIUM 20 MG PO TABS
20.0000 mg | ORAL_TABLET | Freq: Every day | ORAL | Status: DC
  Administered 2014-12-03 – 2014-12-04 (×2): 20 mg via ORAL
  Filled 2014-12-02 (×2): qty 1

## 2014-12-02 MED ORDER — SODIUM CHLORIDE 0.9 % IV SOLN
INTRAVENOUS | Status: DC
  Administered 2014-12-03 – 2014-12-04 (×4): via INTRAVENOUS

## 2014-12-02 MED ORDER — ALBUTEROL SULFATE (2.5 MG/3ML) 0.083% IN NEBU
2.5000 mg | INHALATION_SOLUTION | Freq: Once | RESPIRATORY_TRACT | Status: AC
  Administered 2014-12-02: 2.5 mg via RESPIRATORY_TRACT
  Filled 2014-12-02: qty 3

## 2014-12-02 MED ORDER — ASPIRIN EC 81 MG PO TBEC: 81.0000 mg | DELAYED_RELEASE_TABLET | Freq: Every day | ORAL | Status: DC

## 2014-12-02 MED ORDER — PREDNISONE 20 MG PO TABS: 60.0000 mg | ORAL_TABLET | Freq: Every day | ORAL | Status: DC

## 2014-12-02 MED ORDER — MONTELUKAST SODIUM 10 MG PO TABS: 10.0000 mg | ORAL_TABLET | Freq: Every day | ORAL | Status: DC

## 2014-12-02 MED ORDER — SODIUM CHLORIDE 0.9 % IV BOLUS (SEPSIS): 500.0000 mL | Freq: Once | INTRAVENOUS | Status: DC

## 2014-12-02 MED ORDER — AZITHROMYCIN 250 MG PO TABS
500.0000 mg | ORAL_TABLET | Freq: Once | ORAL | Status: AC
  Administered 2014-12-02: 500 mg via ORAL
  Filled 2014-12-02: qty 2

## 2014-12-02 MED ORDER — ALBUTEROL SULFATE (2.5 MG/3ML) 0.083% IN NEBU: 2.5000 mg | INHALATION_SOLUTION | RESPIRATORY_TRACT | Status: AC | PRN

## 2014-12-02 MED ORDER — INSULIN ASPART 100 UNIT/ML ~~LOC~~ SOLN
0.0000 [IU] | Freq: Three times a day (TID) | SUBCUTANEOUS | Status: DC
  Administered 2014-12-03: 15 [IU] via SUBCUTANEOUS

## 2014-12-02 MED ORDER — ONDANSETRON HCL 4 MG/2ML IJ SOLN: 4.0000 mg | Freq: Four times a day (QID) | INTRAMUSCULAR | Status: DC | PRN

## 2014-12-02 MED ORDER — CARVEDILOL 3.125 MG PO TABS
6.2500 mg | ORAL_TABLET | Freq: Two times a day (BID) | ORAL | Status: DC
  Administered 2014-12-03 – 2014-12-05 (×6): 6.25 mg via ORAL
  Filled 2014-12-02 (×7): qty 2

## 2014-12-02 MED ORDER — ACETAMINOPHEN 650 MG RE SUPP: 650.0000 mg | Freq: Four times a day (QID) | RECTAL | Status: DC | PRN

## 2014-12-02 MED ORDER — HEPARIN SODIUM (PORCINE) 5000 UNIT/ML IJ SOLN
5000.0000 [IU] | Freq: Three times a day (TID) | INTRAMUSCULAR | Status: DC
  Administered 2014-12-03 – 2014-12-04 (×6): 5000 [IU] via SUBCUTANEOUS
  Filled 2014-12-02 (×6): qty 1

## 2014-12-02 MED ORDER — ASPIRIN EC 81 MG PO TBEC
81.0000 mg | DELAYED_RELEASE_TABLET | Freq: Every day | ORAL | Status: DC
  Administered 2014-12-03 – 2014-12-05 (×3): 81 mg via ORAL
  Filled 2014-12-02 (×3): qty 1

## 2014-12-02 MED ORDER — SODIUM CHLORIDE 0.9 % IJ SOLN
3.0000 mL | Freq: Two times a day (BID) | INTRAMUSCULAR | Status: DC
  Administered 2014-12-03: 3 mL via INTRAVENOUS

## 2014-12-02 MED ORDER — DEXTROSE 5 % IV SOLN
1.0000 g | Freq: Once | INTRAVENOUS | Status: AC
  Administered 2014-12-02: 1 g via INTRAVENOUS
  Filled 2014-12-02: qty 10

## 2014-12-02 MED ORDER — MOMETASONE FURO-FORMOTEROL FUM 100-5 MCG/ACT IN AERO
2.0000 | INHALATION_SPRAY | Freq: Two times a day (BID) | RESPIRATORY_TRACT | Status: DC
  Administered 2014-12-03 – 2014-12-05 (×5): 2 via RESPIRATORY_TRACT
  Filled 2014-12-02: qty 8.8

## 2014-12-02 MED ORDER — GABAPENTIN 300 MG PO CAPS
300.0000 mg | ORAL_CAPSULE | Freq: Three times a day (TID) | ORAL | Status: DC
  Administered 2014-12-03 – 2014-12-05 (×8): 300 mg via ORAL
  Filled 2014-12-02 (×8): qty 1

## 2014-12-02 NOTE — ED Notes (Addendum)
Pt c/o of SOB since yesterday. Pt hx of asthma. Pt also states CBG this morning was 383. CBG in triage is 442. Pt states she took her insulin today.

## 2014-12-02 NOTE — ED Provider Notes (Signed)
CSN: [id number]     Arrival date & time 12/02/14  1457 History   First MD Initiated Contact with Patient 12/02/14 1544     Chief Complaint  Patient presents with  . Shortness of Breath  . Hyperglycemia      HPI  Pt was seen at 1550. Per pt, c/o gradual onset and worsening of persistent left upper chest "pain" since yesterday. Has been associated with SOB and "wheezing." Pt also states her CBG's have been "running really high" since yesterday. LD insulin 70/30 was 0900 today. Denies cough, no fevers, no abd pain, no N/V/D, no back pain, no rash, no palpitations.    Past Medical History  Diagnosis Date  . Asthma   . Hypertension   . Diabetes mellitus   . Hyperlipidemia   . History of echocardiogram [id number]    LVH, diastolic dysfunction   Past Surgical History  Procedure Laterality Date  . Tracheostomy      at age 53 from asthma attack  . Cataract extraction w/phaco Right 12/10/2012    Procedure: CATARACT EXTRACTION PHACO AND INTRAOCULAR LENS PLACEMENT (IOC);  Surgeon: Tonny Branch, MD;  Location: [facility];  Service: Ophthalmology;  Laterality: Right;  CDE:22..42  . Left heart catheterization with coronary angiogram N/A 04/09/2013    Procedure: LEFT HEART CATHETERIZATION WITH CORONARY ANGIOGRAM;  Surgeon: Laverda Page, MD;  Location: [facility] CATH LAB;  Service: Cardiovascular;  Laterality: N/A;  . Cardiac catheterization  03/2013    normal coronary arteries, EF 55%  . Eye surgery Left   . Cardiac catheterization  03/2013    normal coronary arteries   Family History  Problem Relation Age of Onset  . Diabetes Mother    Social History  Substance Use Topics  . Smoking status: Never Smoker   . Smokeless tobacco: Never Used  . Alcohol Use: Yes     Comment: occasional    Review of Systems ROS: Statement: All systems negative except as marked or noted in the HPI; Constitutional: Negative for fever and chills. ; ; Eyes: Negative for eye pain, redness and discharge. ; ; ENMT: Negative for  ear pain, hoarseness, nasal congestion, sinus pressure and sore throat. ; ; Cardiovascular: +CP, SOB. Negative for palpitations, diaphoresis, and peripheral edema. ; ; Respiratory: +wheezing. Negative for cough and stridor. ; ; Gastrointestinal: Negative for nausea, vomiting, diarrhea, abdominal pain, blood in stool, hematemesis, jaundice and rectal bleeding. . ; ; Genitourinary: Negative for dysuria, flank pain and hematuria. ; ; Musculoskeletal: Negative for back pain and neck pain. Negative for swelling and trauma.; ; Skin: Negative for pruritus, rash, abrasions, blisters, bruising and skin lesion.; ; Neuro: Negative for headache, lightheadedness and neck stiffness. Negative for weakness, altered level of consciousness , altered mental status, extremity weakness, paresthesias, involuntary movement, seizure and syncope.      Allergies  Review of patient's allergies indicates no known allergies.  Home Medications   Prior to Admission medications   Medication Sig Start Date End Date Taking? Authorizing Provider  albuterol (PROVENTIL HFA;VENTOLIN HFA) 108 (90 BASE) MCG/ACT inhaler Inhale 2 puffs into the lungs every 6 (six) hours as needed for wheezing.   Yes Historical Provider, MD  albuterol (PROVENTIL) (2.5 MG/3ML) 0.083% nebulizer solution Take 2.5 mg by nebulization every 4 (four) hours as needed for wheezing or shortness of breath.   Yes Historical Provider, MD  Alogliptin-Pioglitazone (OSENI) 25-15 MG TABS Take 1 tablet by mouth daily.   Yes Historical Provider, MD  aspirin EC 81 MG tablet Take  81 mg by mouth daily.    Yes Historical Provider, MD  atorvastatin (LIPITOR) 20 MG tablet Take 20 mg by mouth daily.    Yes Historical Provider, MD  carvedilol (COREG) 6.25 MG tablet Take 6.25 mg by mouth 2 (two) times daily.  11/08/13  Yes Historical Provider, MD  Fluticasone-Salmeterol (ADVAIR) 100-50 MCG/DOSE AEPB Inhale 1 puff into the lungs every 12 (twelve) hours as needed (for shortness of  breath).    Yes Historical Provider, MD  gabapentin (NEURONTIN) 300 MG capsule Take 300 mg by mouth 3 (three) times daily.    Yes Historical Provider, MD  Insulin Isophane & Regular Human (HUMULIN 70/30 KWIKPEN) (70-30) 100 UNIT/ML PEN Inject 48 Units into the skin 2 (two) times daily.   Yes Historical Provider, MD  metFORMIN (GLUCOPHAGE) 1000 MG tablet Take 1 tablet (1,000 mg total) by mouth 2 (two) times daily with a meal. 04/10/13  Yes Adrian Prows, MD  montelukast (SINGULAIR) 10 MG tablet Take 10 mg by mouth daily.    Yes Historical Provider, MD  tetrahydrozoline 0.05 % ophthalmic solution Place 1 drop into both eyes daily as needed. Dry, Itchy Eyes   Yes Historical Provider, MD  torsemide (DEMADEX) 20 MG tablet Take 2 tablets (40 mg total) by mouth daily. Patient taking differently: Take 60 mg by mouth daily.  05/11/14  Yes Kathie Dike, MD  valsartan-hydrochlorothiazide (DIOVAN-HCT) 320-25 MG tablet Take 1 tablet by mouth daily. 10/28/14  Yes Historical Provider, MD  clindamycin (CLEOCIN) 300 MG capsule Take 1 capsule (300 mg total) by mouth 4 (four) times daily. X 7 days Patient not taking: Reported on 12/02/2014 10/16/14   Merrily Pew, MD  HYDROcodone-acetaminophen [facility]) 5-325 MG per tablet Take 2 tablets by mouth every 4 (four) hours as needed. Patient not taking: Reported on 12/02/2014 10/16/14   Merrily Pew, MD  nitroGLYCERIN (NITROSTAT) 0.4 MG SL tablet Place 0.4 mg under the tongue every 5 (five) minutes as needed for chest pain.    Historical Provider, MD   BP 99/49 mmHg  Pulse 86  Temp(Src) 97.5 F (36.4 C) (Oral)  Resp 18  Ht 5' (1.524 m)  Wt 323 lb 1.6 oz (146.557 kg)  BMI 63.10 kg/m2  SpO2 99%  LMP 07/29/2013  BP 97/59 mmHg  Pulse 86  Temp(Src) 97.5 F (36.4 C) (Oral)  Resp 20  Ht 5' (1.524 m)  Wt 323 lb 1.6 oz (146.557 kg)  BMI 63.10 kg/m2  SpO2 99%  LMP 07/29/2013 12/02/14 1846  --  72  14  113/66 mmHg  99 %  --  -- ARC    Physical Exam  1555: Physical  examination:  Nursing notes reviewed; Vital signs and O2 SAT reviewed;  Constitutional: Well developed, Well nourished, Well hydrated, In no acute distress; Head:  Normocephalic, atraumatic; Eyes: EOMI, PERRL, No scleral icterus; ENMT: Mouth and pharynx normal, Mucous membranes moist; Neck: Supple, Full range of motion, No lymphadenopathy; Cardiovascular: Regular rate and rhythm, No gallop; Respiratory: Breath sounds diminished & equal bilaterally, faint scattered wheezes. No audible wheezing. Speaking full sentences with ease, Normal respiratory effort/excursion; Chest: +left upper chest wall tender to palp. No soft tissue crepitus, no deformity. Movement normal; Abdomen: Soft, Nontender, Nondistended, Normal bowel sounds; Genitourinary: No CVA tenderness; Extremities: Pulses normal, No tenderness, +2 pedal edema bilat. No calf asymmetry.; Neuro: AA&Ox3, Major CN grossly intact.  Speech clear. No gross focal motor or sensory deficits in extremities.; Skin: Color normal, Warm, Dry.   ED Course  Procedures (including critical  care time) Labs Review   Imaging Review  I have personally reviewed and evaluated these images and lab results as part of my medical decision-making.   EKG Interpretation None      MDM  MDM Reviewed: previous chart, nursing note and vitals Reviewed previous: labs and ECG Interpretation: labs, ECG and x-ray Total time providing critical care: 30-74 minutes. This excludes time spent performing separately reportable procedures and services. Consults: admitting MD   CRITICAL CARE Performed by: Alfonzo Feller Total critical care time: 35 Critical care time was exclusive of separately billable procedures and treating other patients. Critical care was necessary to treat or prevent imminent or life-threatening deterioration. Critical care was time spent personally by me on the following activities: development of treatment plan with patient and/or surrogate as well as  nursing, discussions with consultants, evaluation of patient's response to treatment, examination of patient, obtaining history from patient or surrogate, ordering and performing treatments and interventions, ordering and review of laboratory studies, ordering and review of radiographic studies, pulse oximetry and re-evaluation of patient's condition.   Results for orders placed or performed during the hospital encounter of 12/02/14  CBC with Differential  Result Value Ref Range   WBC 10.2 4.0 - 10.5 K/uL   RBC 4.68 3.87 - 5.11 MIL/uL   Hemoglobin 12.7 12.0 - 15.0 g/dL   HCT 37.7 36.0 - 46.0 %   MCV 80.6 78.0 - 100.0 fL   MCH 27.1 26.0 - 34.0 pg   MCHC 33.7 30.0 - 36.0 g/dL   RDW 13.1 11.5 - 15.5 %   Platelets 157 150 - 400 K/uL   Neutrophils Relative % 66 %   Neutro Abs 6.7 1.7 - 7.7 K/uL   Lymphocytes Relative 27 %   Lymphs Abs 2.7 0.7 - 4.0 K/uL   Monocytes Relative 6 %   Monocytes Absolute 0.6 0.1 - 1.0 K/uL   Eosinophils Relative 1 %   Eosinophils Absolute 0.1 0.0 - 0.7 K/uL   Basophils Relative 0 %   Basophils Absolute 0.0 0.0 - 0.1 K/uL  CBG monitoring, ED  Result Value Ref Range   Glucose-Capillary 442 (H) 65 - 99 mg/dL  I-stat Chem 8, ED  Result Value Ref Range   Sodium 129 (L) 135 - 145 mmol/L   Potassium 4.6 3.5 - 5.1 mmol/L   Chloride 88 (L) 101 - 111 mmol/L   BUN 80 (H) 6 - 20 mg/dL   Creatinine, Ser 4.10 (H) 0.44 - 1.00 mg/dL   Glucose, Bld 355 (H) 65 - 99 mg/dL   Calcium, Ion 1.20 1.12 - 1.23 mmol/L   TCO2 29 0 - 100 mmol/L   Hemoglobin 15.0 12.0 - 15.0 g/dL   HCT 44.0 36.0 - 46.0 %  I-stat troponin, ED  Result Value Ref Range   Troponin i, poc 0.01 0.00 - 0.08 ng/mL   Comment 3          CBG monitoring, ED  Result Value Ref Range   Glucose-Capillary 301 (H) 65 - 99 mg/dL   Dg Chest 2 View 12/02/2014   CLINICAL DATA:  Pt c/o of SOB since yesterday. Pt hx of asthma. Pt also states CBG this morning was 383. CBG in triage is 442. Pt states she took her  insulin today.  EXAM: CHEST  2 VIEW  COMPARISON:  05/07/2014  FINDINGS: Heart size is normal. There is minimal patchy density in the lingula, possibly infectious. There is no pulmonary edema. No pleural effusions.  IMPRESSION: Atelectasis or early  infiltrate in the lingula.   Electronically Signed   By: Nolon Nations M.D.   On: 12/02/2014 16:19    Results for EMMEE, LISZKA (MRN [id number]) as of 12/02/2014 16:51  Ref. Range 05/09/2014 09:51 05/10/2014 07:54 05/11/2014 07:09 12/02/2014 16:31  BUN Latest Ref Range: 6-20 mg/dL 95 (H) 88 (H) 79 (H) 80 (H)  Creatinine Latest Ref Range: 0.44-1.00 mg/dL 2.27 (H) 1.77 (H) 1.48 (H) 4.10 (H)      1805:  IV abx started for CAP. New ARF on labs. Na corrects to 135 for hyperglycemia. Not acidotic with AG 12. CBG trending downward with IVF. Pt now states since March she "feels like my sugar drops after I take my fluid pill." Question over-diuresis as cause. Dx and testing d/w pt.  Questions answered.  Verb understanding, agreeable to admit.  T/C to [facility] Dr. Marily Memos, case discussed, including:  HPI, pertinent PM/SHx, VS/PE, dx testing, ED course and treatment:  Agreeable to admit, requests to write temporary orders, obtain tele bed to team [facility].   Francine Graven, DO 12/04/14 [phone]

## 2014-12-02 NOTE — H&P (Addendum)
[facility] History and Physical  [name] [id number] DOB: Jul 26, 1965 DOA: 12/02/2014  Referring physician: Dr. Thurnell Garbe - [facility] PCP: [name], MD   Chief Complaint: SOB Wheezing  HPI: [name] is a 49 y.o. female  Shortness of breath and wheezing. Started gradually 1 day ago. Constant. Getting worse. Albuterol twice daily since this time with minimal improvement. Denies any chest pain, fevers, productive sputum. Patient states her home glucoses avulsed been running high recently even into the 300 range typically she is in the low 200 range. No recent changes to her home diabetic regimen. Patient was increased on her diuretic medication partially 3 weeks ago due to continued fluid retention in her lower extremities. She was changed from 20 mg torsemide to 60 mg torsemide. Patient is a decreased urine output over the last couple of days.  Review of Systems:  Constitutional:  No weight loss, night sweats, Fevers, chills,  HEENT:  No headaches, Difficulty swallowing,Tooth/dental problems,Sore throat,  Cardio-vascular:  No chest pain, Orthopnea, dizziness, palpitations  GI:  No heartburn, indigestion, abdominal pain, nausea, vomiting, diarrhea, change in bowel habits Resp: Per HPI Skin:  no rash or lesions.  GU:  no dysuria, change in color of urine, no urgency or frequency. No flank pain.  Musculoskeletal:   No joint pain or swelling. No decreased range of motion. No back pain.  Psych:  No change in mood or affect. No depression or anxiety. No memory loss.   Past Medical History  Diagnosis Date  . Asthma   . Hypertension   . Diabetes mellitus   . Hyperlipidemia   . History of echocardiogram [id number]    LVH, diastolic dysfunction   Past Surgical History  Procedure Laterality Date  . Tracheostomy      at age 67 from asthma attack  . Cataract extraction w/phaco Right 12/10/2012    Procedure: CATARACT EXTRACTION PHACO AND INTRAOCULAR LENS  PLACEMENT (IOC);  Surgeon: Tonny Branch, MD;  Location: [facility];  Service: Ophthalmology;  Laterality: Right;  CDE:22..42  . Left heart catheterization with coronary angiogram N/A 04/09/2013    Procedure: LEFT HEART CATHETERIZATION WITH CORONARY ANGIOGRAM;  Surgeon: Laverda Page, MD;  Location: [facility] CATH LAB;  Service: Cardiovascular;  Laterality: N/A;  . Cardiac catheterization  03/2013    normal coronary arteries, EF 55%  . Eye surgery Left   . Cardiac catheterization  03/2013    normal coronary arteries   Social History:  reports that she has never smoked. She has never used smokeless tobacco. She reports that she drinks alcohol. She reports that she does not use illicit drugs.  No Known Allergies  Family History  Problem Relation Age of Onset  . Diabetes Mother      Prior to Admission medications   Medication Sig Start Date End Date Taking? Authorizing Provider  albuterol (PROVENTIL HFA;VENTOLIN HFA) 108 (90 BASE) MCG/ACT inhaler Inhale 2 puffs into the lungs every 6 (six) hours as needed for wheezing.   Yes Historical Provider, MD  albuterol (PROVENTIL) (2.5 MG/3ML) 0.083% nebulizer solution Take 2.5 mg by nebulization every 4 (four) hours as needed for wheezing or shortness of breath.   Yes Historical Provider, MD  Alogliptin-Pioglitazone (OSENI) 25-15 MG TABS Take 1 tablet by mouth daily.   Yes Historical Provider, MD  aspirin EC 81 MG tablet Take 81 mg by mouth daily.    Yes Historical Provider, MD  atorvastatin (LIPITOR) 20 MG tablet Take 20 mg by mouth daily.    Yes  Historical Provider, MD  carvedilol (COREG) 6.25 MG tablet Take 6.25 mg by mouth 2 (two) times daily.  11/08/13  Yes Historical Provider, MD  Fluticasone-Salmeterol (ADVAIR) 100-50 MCG/DOSE AEPB Inhale 1 puff into the lungs every 12 (twelve) hours as needed (for shortness of breath).    Yes Historical Provider, MD  gabapentin (NEURONTIN) 300 MG capsule Take 300 mg by mouth 3 (three) times daily.    Yes Historical  Provider, MD  Insulin Isophane & Regular Human (HUMULIN 70/30 KWIKPEN) (70-30) 100 UNIT/ML PEN Inject 48 Units into the skin 2 (two) times daily.   Yes Historical Provider, MD  metFORMIN (GLUCOPHAGE) 1000 MG tablet Take 1 tablet (1,000 mg total) by mouth 2 (two) times daily with a meal. 04/10/13  Yes Adrian Prows, MD  montelukast (SINGULAIR) 10 MG tablet Take 10 mg by mouth daily.    Yes Historical Provider, MD  tetrahydrozoline 0.05 % ophthalmic solution Place 1 drop into both eyes daily as needed. Dry, Itchy Eyes   Yes Historical Provider, MD  torsemide (DEMADEX) 20 MG tablet Take 2 tablets (40 mg total) by mouth daily. Patient taking differently: Take 60 mg by mouth daily.  05/11/14  Yes Kathie Dike, MD  valsartan-hydrochlorothiazide (DIOVAN-HCT) 320-25 MG tablet Take 1 tablet by mouth daily. 10/28/14  Yes Historical Provider, MD  clindamycin (CLEOCIN) 300 MG capsule Take 1 capsule (300 mg total) by mouth 4 (four) times daily. X 7 days Patient not taking: Reported on 12/02/2014 10/16/14   Merrily Pew, MD  HYDROcodone-acetaminophen [facility] 5-325 MG per tablet Take 2 tablets by mouth every 4 (four) hours as needed. Patient not taking: Reported on 12/02/2014 10/16/14   Merrily Pew, MD  nitroGLYCERIN (NITROSTAT) 0.4 MG SL tablet Place 0.4 mg under the tongue every 5 (five) minutes as needed for chest pain.    Historical Provider, MD   Physical Exam: Filed Vitals:   12/02/14 1633 12/02/14 1815 12/02/14 1846 12/02/14 1936  BP:  97/59 113/66 148/119  Pulse:   72 66  Temp:      TempSrc:      Resp:  20 14 16   Height:      Weight:      SpO2: 99%  99% 100%    Wt Readings from Last 3 Encounters:  12/02/14 146.557 kg (323 lb 1.6 oz)  10/16/14 155.13 kg (342 lb)  05/07/14 190.284 kg (419 lb 8 oz)    General: Morbidly obese. Appears calm and comfortable Eyes:  PERRL, normal lids, irises & conjunctiva ENT:  grossly normal hearing, lips & tongue Neck:  no LAD, masses or thyromegaly Cardiovascular:   Very difficult to appreciate heart sounds due to body habitus,RRR,. Respiratory:  Again very difficult to appreciate due to body habitus.CTA bilaterally, no w/r/r. Normal respiratory effort. Abdomen:  soft, ntnd Skin:  no rash or induration seen on limited exam Musculoskeletal:  grossly normal tone BUE/BLE Psychiatric:  grossly normal mood and affect, speech fluent and appropriate Neurologic:  grossly non-focal.          Labs on Admission:  Basic Metabolic Panel:  Recent Labs Lab 12/02/14 1631  NA 129*  K 4.6  CL 88*  GLUCOSE 355*  BUN 80*  CREATININE 4.10*   Liver Function Tests: No results for input(s): AST, ALT, ALKPHOS, BILITOT, PROT, ALBUMIN in the last 168 hours. No results for input(s): LIPASE, AMYLASE in the last 168 hours. No results for input(s): AMMONIA in the last 168 hours. CBC:  Recent Labs Lab 12/02/14 1624 12/02/14 1631  WBC 10.2  --   NEUTROABS 6.7  --   HGB 12.7 15.0  HCT 37.7 44.0  MCV 80.6  --   PLT 157  --    Cardiac Enzymes: No results for input(s): CKTOTAL, CKMB, CKMBINDEX, TROPONINI in the last 168 hours.  BNP (last 3 results)  Recent Labs  05/03/14 1406  BNP 122.0*    ProBNP (last 3 results) No results for input(s): PROBNP in the last 8760 hours.  CBG:  Recent Labs Lab 12/02/14 1506 12/02/14 1748  GLUCAP 442* 301*    Radiological Exams on Admission: Dg Chest 2 View  12/02/2014   CLINICAL DATA:  Pt c/o of SOB since yesterday. Pt hx of asthma. Pt also states CBG this morning was 383. CBG in triage is 442. Pt states she took her insulin today.  EXAM: CHEST  2 VIEW  COMPARISON:  05/07/2014  FINDINGS: Heart size is normal. There is minimal patchy density in the lingula, possibly infectious. There is no pulmonary edema. No pleural effusions.  IMPRESSION: Atelectasis or early infiltrate in the lingula.   Electronically Signed   By: Nolon Nations M.D.   On: 12/02/2014 16:19     Assessment/Plan Principal Problem:   ARF (acute  renal failure) (HCC) Active Problems:   Asthma exacerbation   Diabetes mellitus type 2 in obese ([place])   Hypertension   Hyperlipidemia   Morbid obesity ([place])   Hyperglycemia   Hyponatremia  Acute renal failure: creatinine 4.1. Baseline approximately 1.4. Likely multifactorial secondary to worsening diabetes, increased diuretic use (torsemide 20 mg daily increased to 60 mg daily per patient approximately 3 weeks ago). Very little urine output in the last 24 hours. Patient's nephrologist is Dr. Lowanda Foster. - Telemetry - IVF - hold diuretic - Dr Lowanda Foster notified through [place] - BMET in am - Diabetic treatment as below  DM/Hyperglycemia: Incomplete lab workup at this time to properly delineate severity of condition including acidosis. Last A1c 10.4 in march. Pt states she has difficulty managing glucose at home given current insulin regimen of 70/30 and seems to have little knowledge about how her medications work. Glucose likely to increase due to steroids for asthma. - CMET - ABG - SSI - diabetic education - IVF - f/u UA - Insulin drip if DKA  Hyponatremia: Na129 on admission actually 132 given hyperglycemia correction. - IVF  Hypertension: hypotensive on admission - hold Diovan HCT - continue coreg in am  Asthma flare/OSA/obesity hypoventilation: mild flare.  - Prednisone 60mg  daily - duoneb Q4 - continue home dulera, singulair  HLD: - continue statin  Neuropathy: - continue neurontin    Code Status: FULL  DVT Prophylaxis: Hep Family Communication: aunt Disposition Plan: Pending improvement     Giovannina Mun Lenna Sciara, MD Family Medicine [facility] [email or web address] Password [email or web address]

## 2014-12-03 ENCOUNTER — Inpatient Hospital Stay (HOSPITAL_COMMUNITY): Payer: Medicare Other

## 2014-12-03 DIAGNOSIS — E119 Type 2 diabetes mellitus without complications: Secondary | ICD-10-CM

## 2014-12-03 DIAGNOSIS — N183 Chronic kidney disease, stage 3 (moderate): Secondary | ICD-10-CM | POA: Diagnosis not present

## 2014-12-03 DIAGNOSIS — E861 Hypovolemia: Secondary | ICD-10-CM | POA: Diagnosis not present

## 2014-12-03 DIAGNOSIS — N179 Acute kidney failure, unspecified: Secondary | ICD-10-CM | POA: Diagnosis not present

## 2014-12-03 DIAGNOSIS — I129 Hypertensive chronic kidney disease with stage 1 through stage 4 chronic kidney disease, or unspecified chronic kidney disease: Secondary | ICD-10-CM | POA: Diagnosis not present

## 2014-12-03 DIAGNOSIS — E1142 Type 2 diabetes mellitus with diabetic polyneuropathy: Secondary | ICD-10-CM | POA: Diagnosis not present

## 2014-12-03 DIAGNOSIS — E785 Hyperlipidemia, unspecified: Secondary | ICD-10-CM | POA: Diagnosis not present

## 2014-12-03 DIAGNOSIS — R0602 Shortness of breath: Secondary | ICD-10-CM | POA: Diagnosis present

## 2014-12-03 DIAGNOSIS — E1129 Type 2 diabetes mellitus with other diabetic kidney complication: Secondary | ICD-10-CM | POA: Diagnosis not present

## 2014-12-03 DIAGNOSIS — E1165 Type 2 diabetes mellitus with hyperglycemia: Secondary | ICD-10-CM | POA: Diagnosis not present

## 2014-12-03 DIAGNOSIS — E871 Hypo-osmolality and hyponatremia: Secondary | ICD-10-CM | POA: Diagnosis not present

## 2014-12-03 DIAGNOSIS — E1122 Type 2 diabetes mellitus with diabetic chronic kidney disease: Secondary | ICD-10-CM | POA: Diagnosis not present

## 2014-12-03 DIAGNOSIS — T501X5A Adverse effect of loop [high-ceiling] diuretics, initial encounter: Secondary | ICD-10-CM | POA: Diagnosis not present

## 2014-12-03 DIAGNOSIS — Z794 Long term (current) use of insulin: Secondary | ICD-10-CM | POA: Diagnosis not present

## 2014-12-03 DIAGNOSIS — E669 Obesity, unspecified: Secondary | ICD-10-CM

## 2014-12-03 DIAGNOSIS — I10 Essential (primary) hypertension: Secondary | ICD-10-CM | POA: Diagnosis not present

## 2014-12-03 DIAGNOSIS — Z833 Family history of diabetes mellitus: Secondary | ICD-10-CM | POA: Diagnosis not present

## 2014-12-03 DIAGNOSIS — J45901 Unspecified asthma with (acute) exacerbation: Secondary | ICD-10-CM | POA: Diagnosis not present

## 2014-12-03 DIAGNOSIS — I959 Hypotension, unspecified: Secondary | ICD-10-CM | POA: Diagnosis not present

## 2014-12-03 DIAGNOSIS — D649 Anemia, unspecified: Secondary | ICD-10-CM | POA: Diagnosis not present

## 2014-12-03 DIAGNOSIS — N170 Acute kidney failure with tubular necrosis: Secondary | ICD-10-CM | POA: Diagnosis not present

## 2014-12-03 DIAGNOSIS — Z6841 Body Mass Index (BMI) 40.0 and over, adult: Secondary | ICD-10-CM | POA: Diagnosis not present

## 2014-12-03 LAB — GLUCOSE, CAPILLARY
GLUCOSE-CAPILLARY: 332 mg/dL — AB (ref 65–99)
GLUCOSE-CAPILLARY: 346 mg/dL — AB (ref 65–99)
Glucose-Capillary: 303 mg/dL — ABNORMAL HIGH (ref 65–99)
Glucose-Capillary: 419 mg/dL — ABNORMAL HIGH (ref 65–99)

## 2014-12-03 LAB — CBC
HCT: 35.3 % — ABNORMAL LOW (ref 36.0–46.0)
MCH: 26.9 pg (ref 26.0–34.0)
MCHC: 33.7 g/dL (ref 30.0–36.0)
MCV: 79.7 fL (ref 78.0–100.0)
PLATELETS: 99 10*3/uL — AB (ref 150–400)
RBC: 4.43 MIL/uL (ref 3.87–5.11)
RDW: 13.1 % (ref 11.5–15.5)
WBC: 9 10*3/uL (ref 4.0–10.5)

## 2014-12-03 LAB — CREATININE, URINE, RANDOM: Creatinine, Urine: 111.57 mg/dL

## 2014-12-03 LAB — BASIC METABOLIC PANEL
Anion gap: 10 (ref 5–15)
BUN: 86 mg/dL — ABNORMAL HIGH (ref 6–20)
CALCIUM: 8.4 mg/dL — AB (ref 8.9–10.3)
CO2: 26 mmol/L (ref 22–32)
CREATININE: 3.54 mg/dL — AB (ref 0.44–1.00)
Chloride: 94 mmol/L — ABNORMAL LOW (ref 101–111)
GFR calc Af Amer: 16 mL/min — ABNORMAL LOW (ref >60)
GFR calc non Af Amer: 14 mL/min — ABNORMAL LOW (ref >60)
GLUCOSE: 334 mg/dL — AB (ref 65–99)
Potassium: 4.4 mmol/L (ref 3.5–5.1)
Sodium: 130 mmol/L — ABNORMAL LOW (ref 135–145)

## 2014-12-03 LAB — SODIUM, URINE, RANDOM: Sodium, Ur: 38 mmol/L

## 2014-12-03 MED ORDER — PREDNISONE 20 MG PO TABS
40.0000 mg | ORAL_TABLET | Freq: Every day | ORAL | Status: DC
  Administered 2014-12-04: 40 mg via ORAL
  Filled 2014-12-03: qty 2

## 2014-12-03 MED ORDER — INSULIN ASPART 100 UNIT/ML ~~LOC~~ SOLN
5.0000 [IU] | Freq: Once | SUBCUTANEOUS | Status: AC
  Administered 2014-12-03: 5 [IU] via SUBCUTANEOUS

## 2014-12-03 MED ORDER — INSULIN ASPART PROT & ASPART (70-30 MIX) 100 UNIT/ML ~~LOC~~ SUSP
50.0000 [IU] | Freq: Two times a day (BID) | SUBCUTANEOUS | Status: DC
  Filled 2014-12-03: qty 10

## 2014-12-03 MED ORDER — INSULIN ASPART PROT & ASPART (70-30 MIX) 100 UNIT/ML ~~LOC~~ SUSP
50.0000 [IU] | Freq: Two times a day (BID) | SUBCUTANEOUS | Status: DC
  Administered 2014-12-03 – 2014-12-04 (×3): 50 [IU] via SUBCUTANEOUS
  Filled 2014-12-03: qty 10

## 2014-12-03 NOTE — Care Management Note (Signed)
Case Management Note  Patient Details  Name: [name] MRN: [id number] Date of Birth: Nov 08, 1965  Subjective/Objective:                  Pt admitted from home with ARF. Pt lives with her mother and will return home at discharge. Pt has home O2 in place from [facility] and has a neb machine for home use. Pt has an aide M-F, some Saturdays, 2 hours a day. Pt stated she has a cane and walker for home use.  Action/Plan: No CM needs anticipated at discharge.  Expected Discharge Date:  12/05/14               Expected Discharge Plan:  Home/Self Care  In-House Referral:  NA  Discharge planning Services  CM Consult  Post Acute Care Choice:  NA Choice offered to:  NA  DME Arranged:    DME Agency:     HH Arranged:    HH Agency:     Status of Service:  Completed, signed off  Medicare Important Message Given:    Date Medicare IM Given:    Medicare IM give by:    Date Additional Medicare IM Given:    Additional Medicare Important Message give by:     If discussed at [place] of Stay Meetings, dates discussed:    Additional Comments:  Joylene Draft, RN 12/03/2014, 11:31 AM

## 2014-12-03 NOTE — Progress Notes (Signed)
[facility] PROGRESS NOTE  [name] [id number] DOB: Apr 10, 1965 DOA: 12/02/2014 PCP: [name], MD  Assessment/Plan: ARF -Believed 2/2 increased torsemide dose. -Patient also states she was getting dizzy when standing. Likely was overdiuresed. -Continue IVF for now, but lower rte to 75 cc/hr. -Nephrology consultation requested. -Torsemide remains on hold  IDDM, Uncontrolled -Start home regimen of 70/30. -Continue to follow CBGs and adjust regimen as necessary.  Asthma Flare -Seems improved today. -Start prednisone taper. -Continue dulera, singulair, Prn nebs.  Morbid Obesity -Noted. -Has lost close to 100 lbs, is working out at [employer].  Hyperlipidemia -Continue statin.  Peripheral Neuropathy -Continue neurontin.  Code Status: Full Code Family Communication: Patient only  Disposition Plan: DC home once medically ready.   Consultants:  Nephrology   Antibiotics:  None   Subjective: Feels better, less SOB.  Objective: Filed Vitals:   12/03/14 0233 12/03/14 0500 12/03/14 0600 12/03/14 0854  BP: 140/88 93/46 121/84   Pulse: 68 77    Temp:  98 F (36.7 C)    TempSrc:  Oral    Resp: 18 20 18    Height:      Weight:      SpO2:  100% 98% 98%    Intake/Output Summary (Last 24 hours) at 12/03/14 1113 Last data filed at 12/03/14 0500  Gross per 24 hour  Intake      0 ml  Output    800 ml  Net   -800 ml   Filed Weights   12/02/14 1502 12/02/14 2044  Weight: 146.557 kg (323 lb 1.6 oz) 145.968 kg (321 lb 12.8 oz)    Exam:   General:  AA Ox3  Cardiovascular: RRR  Respiratory: CTA B, distant breath sonds  Abdomen: obese/S/NT/ND/+BS  Extremities: trace bilateral edema   Neurologic:  Intact and non-focal  Data Reviewed: Basic Metabolic Panel:  Recent Labs Lab 12/02/14 1624 12/02/14 1631 12/03/14 0626  NA 129* 129* 130*  K 4.7 4.6 4.4  CL 87* 88* 94*  CO2 30  --  26  GLUCOSE 352* 355* 334*  BUN 88* 80* 86*   CREATININE 4.52* 4.10* 3.54*  CALCIUM 9.5  --  8.4*  MG 2.1  --   --   PHOS 5.4*  --   --    Liver Function Tests:  Recent Labs Lab 12/02/14 1624  AST 22  ALT 25  ALKPHOS 86  BILITOT 0.4  PROT 7.9  ALBUMIN 4.0   No results for input(s): LIPASE, AMYLASE in the last 168 hours. No results for input(s): AMMONIA in the last 168 hours. CBC:  Recent Labs Lab 12/02/14 1624 12/02/14 1631 12/03/14 0626  WBC 10.2  --  9.0  NEUTROABS 6.7  --   --   HGB 12.7 15.0 RESULT REPEATED AND VERIFIED  HCT 37.7 44.0 35.3*  MCV 80.6  --  79.7  PLT 157  --  99*   Cardiac Enzymes: No results for input(s): CKTOTAL, CKMB, CKMBINDEX, TROPONINI in the last 168 hours. BNP (last 3 results)  Recent Labs  05/03/14 1406  BNP 122.0*    ProBNP (last 3 results) No results for input(s): PROBNP in the last 8760 hours.  CBG:  Recent Labs Lab 12/02/14 1506 12/02/14 1748 12/02/14 2038 12/03/14 0723  GLUCAP 442* 301* 220* 332*    No results found for this or any previous visit (from the past 240 hour(s)).   Studies: Dg Chest 2 View  12/02/2014   CLINICAL DATA:  Pt c/o of  SOB since yesterday. Pt hx of asthma. Pt also states CBG this morning was 383. CBG in triage is 442. Pt states she took her insulin today.  EXAM: CHEST  2 VIEW  COMPARISON:  05/07/2014  FINDINGS: Heart size is normal. There is minimal patchy density in the lingula, possibly infectious. There is no pulmonary edema. No pleural effusions.  IMPRESSION: Atelectasis or early infiltrate in the lingula.   Electronically Signed   By: Nolon Nations M.D.   On: 12/02/2014 16:19    Scheduled Meds: . aspirin EC  81 mg Oral Daily  . atorvastatin  20 mg Oral q1800  . carvedilol  6.25 mg Oral BID WC  . gabapentin  300 mg Oral TID  . heparin  5,000 Units Subcutaneous 3 times per day  . insulin aspart protamine- aspart  50 Units Subcutaneous BID WC  . mometasone-formoterol  2 puff Inhalation BID  . montelukast  10 mg Oral Daily  .  predniSONE  60 mg Oral Q breakfast  . sodium chloride  500 mL Intravenous Once  . sodium chloride  3 mL Intravenous Q12H   Continuous Infusions: . sodium chloride    . sodium chloride 150 mL/hr at 12/03/14 0216    Principal Problem:   ARF (acute renal failure) (HCC) Active Problems:   Asthma exacerbation   Diabetes mellitus type 2 in obese ([place])   Hypertension   Hyperlipidemia   Morbid obesity ([place])   Hyperglycemia   Hyponatremia    Time spent: 25 minutes. Greater than 50% of this time was spent in direct contact with the patient coordinating care.    Lelon Frohlich  [facility] Pager [phone]  If 7PM-7AM, please contact night-coverage at [email or web address], password [email or web address] 12/03/2014, 11:13 AM

## 2014-12-03 NOTE — Consult Note (Signed)
[name] MRN: [id number] DOB/AGE: 1965/06/15 49 y.o. Primary Care Physician:HILL,GERALD K, MD Admit date: 12/02/2014 Chief Complaint:  Chief Complaint  Patient presents with  . Shortness of Breath  . Hyperglycemia   HPI:Pt is 49 year old female with past medical hx of DM who presented to ER with c/o Dyspnea  HPI dates back to 1-2 days ago when pt started having dyspneam slow in onset, progressivelly worseing. Pt also c/o uncontrolled DM Pt also c/o incewasing LE dema, for pt increased her diuretics NO c/o chest pain NO c/o fever/cough/chills NO c/o nausea/vomiting/abdominal pain No c/o change in speech/vision   Past Medical History  Diagnosis Date  . Asthma   . Hypertension   . Diabetes mellitus   . Hyperlipidemia   . History of echocardiogram [id number]    LVH, diastolic dysfunction      Family History  Problem Relation Age of Onset  . Diabetes Mother     Social History:  reports that she has never smoked. She has never used smokeless tobacco. She reports that she drinks alcohol. She reports that she does not use illicit drugs.   Allergies: No Known Allergies  Medications Prior to Admission  Medication Sig Dispense Refill  . albuterol (PROVENTIL HFA;VENTOLIN HFA) 108 (90 BASE) MCG/ACT inhaler Inhale 2 puffs into the lungs every 6 (six) hours as needed for wheezing.    Marland Kitchen albuterol (PROVENTIL) (2.5 MG/3ML) 0.083% nebulizer solution Take 2.5 mg by nebulization every 4 (four) hours as needed for wheezing or shortness of breath.    . Alogliptin-Pioglitazone (OSENI) 25-15 MG TABS Take 1 tablet by mouth daily.    Marland Kitchen aspirin EC 81 MG tablet Take 81 mg by mouth daily.     Marland Kitchen atorvastatin (LIPITOR) 20 MG tablet Take 20 mg by mouth daily.     . carvedilol (COREG) 6.25 MG tablet Take 6.25 mg by mouth 2 (two) times daily.     . Fluticasone-Salmeterol (ADVAIR) 100-50 MCG/DOSE AEPB Inhale 1 puff into the lungs every 12 (twelve) hours as needed (for shortness of breath).     .  gabapentin (NEURONTIN) 300 MG capsule Take 300 mg by mouth 3 (three) times daily.     . Insulin Isophane & Regular Human (HUMULIN 70/30 KWIKPEN) (70-30) 100 UNIT/ML PEN Inject 48 Units into the skin 2 (two) times daily.    . metFORMIN (GLUCOPHAGE) 1000 MG tablet Take 1 tablet (1,000 mg total) by mouth 2 (two) times daily with a meal.    . montelukast (SINGULAIR) 10 MG tablet Take 10 mg by mouth daily.     Marland Kitchen tetrahydrozoline 0.05 % ophthalmic solution Place 1 drop into both eyes daily as needed. Dry, Itchy Eyes    . torsemide (DEMADEX) 20 MG tablet Take 2 tablets (40 mg total) by mouth daily. (Patient taking differently: Take 60 mg by mouth daily. ) 30 tablet 1  . valsartan-hydrochlorothiazide (DIOVAN-HCT) 320-25 MG tablet Take 1 tablet by mouth daily.    . clindamycin (CLEOCIN) 300 MG capsule Take 1 capsule (300 mg total) by mouth 4 (four) times daily. X 7 days (Patient not taking: Reported on 12/02/2014) 28 capsule 0  . HYDROcodone-acetaminophen (NORCO) 5-325 MG per tablet Take 2 tablets by mouth every 4 (four) hours as needed. (Patient not taking: Reported on 12/02/2014) 10 tablet 0  . nitroGLYCERIN (NITROSTAT) 0.4 MG SL tablet Place 0.4 mg under the tongue every 5 (five) minutes as needed for chest pain.         [id number] from the  symptoms mentioned above,there are no other symptoms referable to all systems reviewed.  Marland Kitchen aspirin EC  81 mg Oral Daily  . atorvastatin  20 mg Oral q1800  . carvedilol  6.25 mg Oral BID WC  . gabapentin  300 mg Oral TID  . heparin  5,000 Units Subcutaneous 3 times per day  . insulin aspart  0-20 Units Subcutaneous TID WC  . insulin aspart  0-5 Units Subcutaneous QHS  . mometasone-formoterol  2 puff Inhalation BID  . montelukast  10 mg Oral Daily  . predniSONE  60 mg Oral Q breakfast  . sodium chloride  500 mL Intravenous Once  . sodium chloride  3 mL Intravenous Q12H      Physical Exam: Vital signs in last 24 hours: Temp:  [97.5 F (36.4 C)-98 F (36.7  C)] 98 F (36.7 C) (10/05 0500) Pulse Rate:  [58-86] 77 (10/05 0500) Resp:  [14-20] 18 (10/05 0600) BP: (93-148)/(46-119) 121/84 mmHg (10/05 0600) SpO2:  [94 %-100 %] 98 % (10/05 0854) Weight:  [321 lb 12.8 oz (145.968 kg)-323 lb 1.6 oz (146.557 kg)] 321 lb 12.8 oz (145.968 kg) (10/04 2044) Weight change:  Last BM Date: 12/02/14  Intake/Output from previous day: 10/04 0701 - 10/05 0700 In: -  Out: 800 [Urine:800]     Physical Exam: General- pt is awake,alert, oriented to time place and person Resp- No acute REsp distress, CTA B/L NO Rhonchi CVS- S1S2 regular in rate and rhythm GIT- BS+, soft, NT, ND, morbidly obese EXT- NO LE Edema, Cyanosis CNS- CN 2-12 grossly intact. Moving all 4 extremities Psych- normal mood and affect    Lab Results: CBC  Recent Labs  12/02/14 1624 12/02/14 1631 12/03/14 0626  WBC 10.2  --  9.0  HGB 12.7 15.0 RESULT REPEATED AND VERIFIED  HCT 37.7 44.0 35.3*  PLT 157  --  99*    BMET  Recent Labs  12/02/14 1624 12/02/14 1631 12/03/14 0626  NA 129* 129* 130*  K 4.7 4.6 4.4  CL 87* 88* 94*  CO2 30  --  26  GLUCOSE 352* 355* 334*  BUN 88* 80* 86*  CREATININE 4.52* 4.10* 3.54*  CALCIUM 9.5  --  8.4*    Trend Creat 2016  4.52=>4.1=>3.54            3.0=>1.5 ( Admission in March )  2015  1.0--1.9  Sodium 129=>130  MICRO No results found for this or any previous visit (from the past 240 hour(s)).    Lab Results  Component Value Date   CALCIUM 8.4* 12/03/2014   CAION 1.20 12/02/2014   PHOS 5.4* 12/02/2014      Impression: 1)Renal  AKI secondary to Prerenal/ATN                AKI sec to Hypovolemia/Hypotension + ARB                AKi improving                AKI on CKD               CKD stage 3.               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             2)HTN  Medication- On Alpha and beta Blockers  3)Anemia HGb at goal   4)CKD Mineral-Bone Disorder PTH not avail. Phosphorus at goal. Calcium at goal.  5)Endo- hx of DM Primary MD following  6)Electyolytes  Normokalemic  Hyponatremic    Multifactorial      Hypovolemia      HCTZ      Hyperglycemia    Sodium improving  7)Acid base Co2 at goal     Plan:  Will ask for FENA Will ask for renal U/s Agree with current IVF Agree with holding ARB Agree with holding HCTZ Will follow BMET      [place] S 12/03/2014, 10:11 AM

## 2014-12-03 NOTE — Progress Notes (Signed)
Inpatient Diabetes Program Recommendations  AACE/ADA: New Consensus Statement on Inpatient Glycemic Control (2015)  Target Ranges:  Prepandial:   less than 140 mg/dL      Peak postprandial:   less than 180 mg/dL (1-2 hours)      Critically ill patients:  140 - 180 mg/dL  Results for CHENELL, DULONG (MRN [id number]) as of 12/03/2014 08:52  Ref. Range 12/02/2014 15:06 12/02/2014 17:48 12/02/2014 20:38 12/03/2014 07:23  Glucose-Capillary Latest Ref Range: 65-99 mg/dL 442 (H) 301 (H) 220 (H) 332 (H)   Review of Glycemic Control  Diabetes history: DM2 Outpatient Diabetes medications: 70/30 48 units BID, Metformin 1000 BID, Oseni 25-15 mg daily Current orders for Inpatient glycemic control: Novolog 0-20 units TID with meals, Novolog 0-5 units HS  Inpatient Diabetes Program Recommendations: Insulin - Basal: Please consider ordering 70/30 50 units BID.  Thanks, Barnie Alderman, RN, MSN, CCRN, CDE Diabetes Coordinator Inpatient Diabetes Program [phone] (Team Pager from [place] to [place]) [phone] ([facility] office) [phone] [facility] office) [phone] [facility] office)

## 2014-12-04 DIAGNOSIS — J45901 Unspecified asthma with (acute) exacerbation: Secondary | ICD-10-CM

## 2014-12-04 LAB — CBC
HCT: 33.2 % — ABNORMAL LOW (ref 36.0–46.0)
HEMOGLOBIN: 11 g/dL — AB (ref 12.0–15.0)
MCH: 26.9 pg (ref 26.0–34.0)
MCHC: 33.1 g/dL (ref 30.0–36.0)
MCV: 81.2 fL (ref 78.0–100.0)
Platelets: 144 10*3/uL — ABNORMAL LOW (ref 150–400)
RBC: 4.09 MIL/uL (ref 3.87–5.11)
RDW: 13.5 % (ref 11.5–15.5)
WBC: 11.4 10*3/uL — ABNORMAL HIGH (ref 4.0–10.5)

## 2014-12-04 LAB — BASIC METABOLIC PANEL
ANION GAP: 11 (ref 5–15)
BUN: 79 mg/dL — ABNORMAL HIGH (ref 6–20)
CALCIUM: 8.6 mg/dL — AB (ref 8.9–10.3)
CO2: 23 mmol/L (ref 22–32)
CREATININE: 2.32 mg/dL — AB (ref 0.44–1.00)
Chloride: 99 mmol/L — ABNORMAL LOW (ref 101–111)
GFR calc Af Amer: 27 mL/min — ABNORMAL LOW (ref >60)
GFR calc non Af Amer: 24 mL/min — ABNORMAL LOW (ref >60)
GLUCOSE: 361 mg/dL — AB (ref 65–99)
Potassium: 4.6 mmol/L (ref 3.5–5.1)
Sodium: 133 mmol/L — ABNORMAL LOW (ref 135–145)

## 2014-12-04 LAB — HEMOGLOBIN A1C
HEMOGLOBIN A1C: 14.8 % — AB (ref 4.8–5.6)
Mean Plasma Glucose: 378 mg/dL

## 2014-12-04 LAB — GLUCOSE, CAPILLARY
GLUCOSE-CAPILLARY: 318 mg/dL — AB (ref 65–99)
GLUCOSE-CAPILLARY: 279 mg/dL — AB (ref 65–99)
Glucose-Capillary: 386 mg/dL — ABNORMAL HIGH (ref 65–99)
Glucose-Capillary: 423 mg/dL — ABNORMAL HIGH (ref 65–99)

## 2014-12-04 MED ORDER — PREDNISONE 20 MG PO TABS
30.0000 mg | ORAL_TABLET | Freq: Every day | ORAL | Status: DC
  Administered 2014-12-05: 30 mg via ORAL
  Filled 2014-12-04 (×2): qty 1

## 2014-12-04 MED ORDER — INSULIN ASPART PROT & ASPART (70-30 MIX) 100 UNIT/ML ~~LOC~~ SUSP
55.0000 [IU] | Freq: Two times a day (BID) | SUBCUTANEOUS | Status: DC
  Administered 2014-12-04 – 2014-12-05 (×2): 55 [IU] via SUBCUTANEOUS
  Filled 2014-12-04: qty 10

## 2014-12-04 NOTE — Progress Notes (Signed)
[facility] PROGRESS NOTE  [name] [id number] DOB: 19-Sep-1965 DOA: 12/02/2014 PCP: [name], MD  Assessment/Plan: ARF -Believed 2/2 increased torsemide dose. -Improving. -Patient also states she was getting dizzy when standing. Likely was overdiuresed. -Continue IVF for now. -Nephrology consultation requested. -Torsemide remains on hold -Recheck renal function in am.  IDDM, Uncontrolled -Increase 70/30 from 50 to 55 units BID. -Continue to follow CBGs and adjust regimen as necessary.  Asthma Flare -Seems improved today. -Continue prednisone taper. -Continue dulera, singulair, Prn nebs.  Morbid Obesity -Noted. -Has lost close to 100 lbs, is working out at [employer].  Hyperlipidemia -Continue statin.  Peripheral Neuropathy -Continue neurontin.  Code Status: Full Code Family Communication: Patient only  Disposition Plan: DC home once medically ready, anticipate 24-48 hours.   Consultants:  Nephrology   Antibiotics:  None   Subjective: Feels better, less SOB.  Objective: Filed Vitals:   12/03/14 2021 12/04/14 0559 12/04/14 0856 12/04/14 1355  BP: 121/57 128/72  122/59  Pulse: 94 82  73  Temp: 98.4 F (36.9 C) 98.6 F (37 C)  98.2 F (36.8 C)  TempSrc: Oral Oral  Oral  Resp: 20 20  20   Height:      Weight:      SpO2: 97% 97% 95% 92%    Intake/Output Summary (Last 24 hours) at 12/04/14 1508 Last data filed at 12/04/14 1230  Gross per 24 hour  Intake   3230 ml  Output   1000 ml  Net   2230 ml   Filed Weights   12/02/14 1502 12/02/14 2044  Weight: 146.557 kg (323 lb 1.6 oz) 145.968 kg (321 lb 12.8 oz)    Exam:   General:  AA Ox3  Cardiovascular: RRR  Respiratory: CTA B, distant breath sonds  Abdomen: obese/S/NT/ND/+BS  Extremities: trace bilateral edema   Neurologic:  Intact and non-focal  Data Reviewed: Basic Metabolic Panel:  Recent Labs Lab 12/02/14 1624 12/02/14 1631 12/03/14 0626  12/04/14 0739  NA 129* 129* 130* 133*  K 4.7 4.6 4.4 4.6  CL 87* 88* 94* 99*  CO2 30  --  26 23  GLUCOSE 352* 355* 334* 361*  BUN 88* 80* 86* 79*  CREATININE 4.52* 4.10* 3.54* 2.32*  CALCIUM 9.5  --  8.4* 8.6*  MG 2.1  --   --   --   PHOS 5.4*  --   --   --    Liver Function Tests:  Recent Labs Lab 12/02/14 1624  AST 22  ALT 25  ALKPHOS 86  BILITOT 0.4  PROT 7.9  ALBUMIN 4.0   No results for input(s): LIPASE, AMYLASE in the last 168 hours. No results for input(s): AMMONIA in the last 168 hours. CBC:  Recent Labs Lab 12/02/14 1624 12/02/14 1631 12/03/14 0626 12/04/14 0739  WBC 10.2  --  9.0 11.4*  NEUTROABS 6.7  --   --   --   HGB 12.7 15.0 RESULT REPEATED AND VERIFIED 11.0*  HCT 37.7 44.0 35.3* 33.2*  MCV 80.6  --  79.7 81.2  PLT 157  --  99* 144*   Cardiac Enzymes: No results for input(s): CKTOTAL, CKMB, CKMBINDEX, TROPONINI in the last 168 hours. BNP (last 3 results)  Recent Labs  05/03/14 1406  BNP 122.0*    ProBNP (last 3 results) No results for input(s): PROBNP in the last 8760 hours.  CBG:  Recent Labs Lab 12/03/14 1116 12/03/14 1636 12/03/14 2020 12/04/14 0738 12/04/14 1125  GLUCAP 346* 303*  419* 318* 279*    No results found for this or any previous visit (from the past 240 hour(s)).   Studies: Dg Chest 2 View  12/02/2014   CLINICAL DATA:  Pt c/o of SOB since yesterday. Pt hx of asthma. Pt also states CBG this morning was 383. CBG in triage is 442. Pt states she took her insulin today.  EXAM: CHEST  2 VIEW  COMPARISON:  05/07/2014  FINDINGS: Heart size is normal. There is minimal patchy density in the lingula, possibly infectious. There is no pulmonary edema. No pleural effusions.  IMPRESSION: Atelectasis or early infiltrate in the lingula.   Electronically Signed   By: Nolon Nations M.D.   On: 12/02/2014 16:19   US Renal  12/03/2014   CLINICAL DATA:  Acute tubular necrosis  EXAM: RENAL / URINARY TRACT ULTRASOUND COMPLETE   COMPARISON:  None.  FINDINGS: Right Kidney:  Length: 11.6 cm. Echogenicity within normal limits. No mass or hydronephrosis visualized.  Left Kidney:  Length: 10.7 cm. Echogenicity within normal limits. No mass or hydronephrosis visualized.  Bladder:  Predominantly decompressed  IMPRESSION: No acute abnormality noted.   Electronically Signed   By: Inez Catalina M.D.   On: 12/03/2014 16:00    Scheduled Meds: . aspirin EC  81 mg Oral Daily  . atorvastatin  20 mg Oral q1800  . carvedilol  6.25 mg Oral BID WC  . gabapentin  300 mg Oral TID  . heparin  5,000 Units Subcutaneous 3 times per day  . insulin aspart protamine- aspart  50 Units Subcutaneous BID WC  . mometasone-formoterol  2 puff Inhalation BID  . montelukast  10 mg Oral Daily  . predniSONE  40 mg Oral Q breakfast  . sodium chloride  500 mL Intravenous Once  . sodium chloride  3 mL Intravenous Q12H   Continuous Infusions: . sodium chloride      Principal Problem:   ARF (acute renal failure) (HCC) Active Problems:   Asthma exacerbation   Diabetes mellitus type 2 in obese (HCC)   Hypertension   Hyperlipidemia   Morbid obesity ([place])   Hyperglycemia   Hyponatremia    Time spent: 25 minutes. Greater than 50% of this time was spent in direct contact with the patient coordinating care.    Lelon Frohlich  [facility] Pager [phone]  If 7PM-7AM, please contact night-coverage at [email or web address], password [email or web address] 12/04/2014, 3:08 PM  LOS: 1 day

## 2014-12-04 NOTE — Progress Notes (Addendum)
Inpatient Diabetes Program Recommendations  AACE/ADA: New Consensus Statement on Inpatient Glycemic Control (2015)  Target Ranges:  Prepandial:   less than 140 mg/dL      Peak postprandial:   less than 180 mg/dL (1-2 hours)      Critically ill patients:  140 - 180 mg/dL  Results for [name], [name] (MRN [id number]) as of 12/04/2014 08:47  Ref. Range 12/03/2014 07:23 12/03/2014 11:16 12/03/2014 16:36 12/03/2014 20:20 12/04/2014 07:38  Glucose-Capillary Latest Ref Range: 65-99 mg/dL 332 (H) 346 (H) 303 (H) 419 (H) 318 (H)   Review of Glycemic Control Diabetes history: DM2 Outpatient Diabetes medications: 70/30 48 units BID, Metformin 1000 BID, Oseni 25-15 mg daily Current orders for Inpatient glycemic control: 70/30 50 units BID   Inpatient Diabetes Program Recommendations: Insulin - Basal: Please consider increasing 70/30 to 55 units BID. Correction (SSI): Please order Novolog moderate correction sclale TID with meals and also Novolog bedtime correction scale.  Note: Glucose ranged from 303-419 mg/dl yesterday and fasting glucose is 318 mg/dl this morning. Patient did receive 70/30 50 units BID yesterday and noted Prednisone was decreased from 60 mg QAM to 40 mg QAM. Please consider increaing 70/30 to 55 units BID and also order Novolog moderate correction scale along with Novolog bedtime correction scale.  12/04/14@14 :30-Spoke with patient about diabetes and home regimen for diabetes control. Patient reports that she is followed by her PCP for diabetes management and currently she takes 70/30 24 units with breakfast, 70/30 24 units with lunch, and 70/30 in the evening if need, Metformin 1000 mg BID. And Oseni 25-15 mg daily as an outpatient for diabetes control.  Patient states that she is prescribed 70/30 48 units BID but she has split the dosages (on her own) since she had experienced a hypoglycemic episode in July and another one in August which required help from others.  Patient reports  that her blood sugar is usually in the 200's when she checks it but at times it has read "HI" on her glucometer which is over 600 mg/dl. Inquired about knowledge about A1C and patient reports that she does not know what an A1C is exactly and she does not remember what her last A1C was. Discussed A1C results (14.8% on 12/02/14) and explained what an A1C is. Provided written information on A1C to provide further information on what an A1C is. Discussed glucose and A1C target goals. Informed patient hat her current A1C indicates an average glucose of about 400 mg/dl. Discussed importance of taking medication as prescribed, checking CBGs, and maintaining good CBG control to prevent long-term and short-term complications. Discussed properties of  70/30 insulin and explained that it is usually taken with breakfast and with supper. Discussed impact of nutrition, exercise, stress, sickness, and medications on diabetes control. Explained that currently patient is on steroids here in the hospital which is contributing to elevated glucose. Patient reports that she works out 3 days a week.  Discussed carbohydrates, carbohydrate goals per day and meal, along with portion sizes. Patient states that she does not know how to count carbohydrates and she does not currently look at the amount of carbohydrates in foods. Discussed food groups high in carbohydrates and showed patient how to look at carbohydrates on nutrition labels. Provided patient with handout information on free outpatient diabetes education class here at [facility] and encouraged patient to attend once the classes. Placed consult for RD for further carb modified diet education while inpatient. Patient states that she is not followed by  an endocrinologist and she would like to be referred to a local endocrinologist. Provided contact information for Dr. Dorris Fetch and asked that she have her PCP make a referral to Dr. Dorris Fetch for her so she can get help with improving  diabetes control. Encouraged patient to begin following a carb modified diet, take medication as prescribed, check glucose 3-4 times per day, keep a log of glucose readings and insulin taken, ask her PCP about getting a referral to Dr. Dorris Fetch, and to try to attend one of the free outpatient diabetes education classes.  Patient verbalized understanding of information discussed and she states that she has no further questions at this time related to diabetes.   Thanks, Barnie Alderman, RN, MSN, CCRN, CDE Diabetes Coordinator Inpatient Diabetes Program [phone] (Team Pager from [place] to [place]) [phone] ([place] office) [phone] [facility] office) [phone] [facility] office)

## 2014-12-04 NOTE — Consult Note (Signed)
   [facility] CM Inpatient Consult   12/04/2014  [name] 1965/07/02 [id number]  Spoke with patient at bedside regarding [facility] services. Patient does not want to participate with [facility] at this time. Patient given [facility] brochure and RNCM contact information for future reference, voices appreciation of information.   Inpatient case manager aware that patient refused [facility] case management services.  Of note, [facility] Care Management services would not replace or interfere with any services that are arranged by inpatient case management or social work. For additional questions or referrals please contact:  Royetta Crochet. Laymond Purser, RN, BSN, [facility] Liaison [phone]

## 2014-12-04 NOTE — Progress Notes (Signed)
Subjective: Patient is feeling much better. Presently she denies any difficulty breathing, no cough, fever or chills. Denies also any nausea or vomiting.   Objective: Vital signs in last 24 hours: Temp:  [98.2 F (36.8 C)-98.6 F (37 C)] 98.6 F (37 C) (10/06 0559) Pulse Rate:  [75-94] 82 (10/06 0559) Resp:  [18-20] 20 (10/06 0559) BP: (121-128)/(57-72) 128/72 mmHg (10/06 0559) SpO2:  [94 %-100 %] 97 % (10/06 0559)  Intake/Output from previous day: 10/05 0701 - 10/06 0700 In: 2510 [I.V.:2510] Out: 1000 [Urine:1000] Intake/Output this shift:     Recent Labs  12/02/14 1624 12/02/14 1631 12/03/14 0626  HGB 12.7 15.0 RESULT REPEATED AND VERIFIED    Recent Labs  12/02/14 1624 12/02/14 1631 12/03/14 0626  WBC 10.2  --  9.0  RBC 4.68  --  4.43  HCT 37.7 44.0 35.3*  PLT 157  --  99*    Recent Labs  12/02/14 1624 12/02/14 1631 12/03/14 0626  NA 129* 129* 130*  K 4.7 4.6 4.4  CL 87* 88* 94*  CO2 30  --  26  BUN 88* 80* 86*  CREATININE 4.52* 4.10* 3.54*  GLUCOSE 352* 355* 334*  CALCIUM 9.5  --  8.4*   No results for input([name]): LABPT, INR in the last 72 hours.  Generally patient is alert and in no apparent distress. Chest decreased breath sounds bilaterally, no rales, rhonchi or egophony. Heart: Distant S1 and S2, regular rate and rhythm no murmur Extremities possibly trace edema  Assessment/Plan: Problem #1 acute kidney injury: Possibly combination of prerenal syndrome/ATN/ARBS. Her BUN and creatinine presently is improving. Patient is also nonoliguric. Her BUN and creatinine still above her baseline. Problem #2 difficulty in breathing: Most likely from exacerbation of her asthma and presently patient is feeling much better. Problem #3 hypervolemic hyponatremia: Sodium is improving Problem #4 hypertension: Her blood pressure is reasonably controlled Problem #5 chronic renal failure: Stage III. Thought to be secondary to diabetes/hypertension/obesity related  glomerulopathy /recurrent AK I. Problem #6 morbid obesity Problem #7 diabetes: Her blood sugar is poorly controlled. Problem #8 hyperlipidemia Plan: We'll change IV fluid to 100 mL per hour We'll check her basic metabolic panel and phosphorus in the morning.   [name] 12/04/2014, 7:56 AM

## 2014-12-04 NOTE — Progress Notes (Signed)
Patient's Blood sugar was 419.  Called MD.  Orders Received.

## 2014-12-04 NOTE — Progress Notes (Signed)
Initial Nutrition Assessment  DOCUMENTATION CODES:   Morbid obesity  INTERVENTION:  - education completed for improved dietary management of DM and diet modification to promote wt loss  -recommend pt follow [facility] with [facility] for further weight loss counseling and assistance with monitoring lifestyle changes and provide support   NUTRITION DIAGNOSIS:   Altered nutrition lab value related to  poorly controlled DM  as evidenced by A1C of 14.8%  .   GOAL: improved glycemic control and wt loss of at least 3-5% and maintained for a year.  MONITOR:   PO intake, Weight trends, Labs  REASON FOR ASSESSMENT:   Consult Diet education  ASSESSMENT:  Pt has poorly controlled diabetes. She says "I eat mostly salads with spring mix and or kale every day and like grits and sausage at breakfast. She drinks grape juice and water with sugar-free flavor packets. She endorses a good appetite and denies skipping meals. Pt says she exercises at the [facility] and wants to drink Glucerna after her "work out". Encouraged pt to avoid this practice because of accumulating undesired excess calories. Discussed with her the importance of controlling her excess caloric intake in order to reduce her weight and potentially improve her glycemic control.  Lab Results  Component Value Date   HGBA1C 14.8* 12/02/2014    RD provided "Carbohydrate Counting, Label reading, Healthy Foods grocery list and Weight Loss Tips" handouts. Discussed different food groups and their effects on blood sugar, emphasizing carbohydrate-containing foods. Provided list of carbohydrates and recommended serving sizes of common foods.  Discussed importance of controlled and consistent carbohydrate intake throughout the day. Provided examples of ways to balance meals/snacks and encouraged intake of high-fiber, whole grain complex carbohydrates. Teach back method used.   Diet Order:  Diet Carb Modified Fluid  consistency:: Thin; Room service appropriate?: Yes  Skin:   dry, intact  Last BM:   10/4  Height:   Ht Readings from Last 1 Encounters:  12/02/14 5\' 1"  (1.549 m)    Weight:   Wt Readings from Last 1 Encounters:  12/02/14 321 lb 12.8 oz (145.968 kg)    Ideal Body Weight:  47.7 kg  BMI:  Body mass index is 60.84 kg/(m^2).  Estimated Nutritional Needs:   Kcal:  2030 (maintainance) reduce to 1500 to promote gradual wt loss  Protein:  120 gr  Fluid:  2.0 liters daily  EDUCATION NEEDS:   Education needs addressed  Colman Cater MS,RD,CSG,LDN Office: [phone] Pager: [phone]

## 2014-12-05 DIAGNOSIS — T501X5A Adverse effect of loop [high-ceiling] diuretics, initial encounter: Secondary | ICD-10-CM | POA: Diagnosis not present

## 2014-12-05 DIAGNOSIS — E1122 Type 2 diabetes mellitus with diabetic chronic kidney disease: Secondary | ICD-10-CM | POA: Diagnosis not present

## 2014-12-05 DIAGNOSIS — E1165 Type 2 diabetes mellitus with hyperglycemia: Secondary | ICD-10-CM | POA: Diagnosis not present

## 2014-12-05 DIAGNOSIS — Z6841 Body Mass Index (BMI) 40.0 and over, adult: Secondary | ICD-10-CM | POA: Diagnosis not present

## 2014-12-05 DIAGNOSIS — Z833 Family history of diabetes mellitus: Secondary | ICD-10-CM | POA: Diagnosis not present

## 2014-12-05 DIAGNOSIS — J45901 Unspecified asthma with (acute) exacerbation: Secondary | ICD-10-CM | POA: Diagnosis not present

## 2014-12-05 DIAGNOSIS — E1142 Type 2 diabetes mellitus with diabetic polyneuropathy: Secondary | ICD-10-CM | POA: Diagnosis not present

## 2014-12-05 DIAGNOSIS — E871 Hypo-osmolality and hyponatremia: Secondary | ICD-10-CM | POA: Diagnosis not present

## 2014-12-05 DIAGNOSIS — E861 Hypovolemia: Secondary | ICD-10-CM | POA: Diagnosis not present

## 2014-12-05 DIAGNOSIS — N179 Acute kidney failure, unspecified: Secondary | ICD-10-CM | POA: Diagnosis not present

## 2014-12-05 DIAGNOSIS — N183 Chronic kidney disease, stage 3 (moderate): Secondary | ICD-10-CM | POA: Diagnosis not present

## 2014-12-05 DIAGNOSIS — E785 Hyperlipidemia, unspecified: Secondary | ICD-10-CM | POA: Diagnosis not present

## 2014-12-05 DIAGNOSIS — Z794 Long term (current) use of insulin: Secondary | ICD-10-CM | POA: Diagnosis not present

## 2014-12-05 DIAGNOSIS — D649 Anemia, unspecified: Secondary | ICD-10-CM | POA: Diagnosis not present

## 2014-12-05 DIAGNOSIS — I129 Hypertensive chronic kidney disease with stage 1 through stage 4 chronic kidney disease, or unspecified chronic kidney disease: Secondary | ICD-10-CM | POA: Diagnosis not present

## 2014-12-05 DIAGNOSIS — I959 Hypotension, unspecified: Secondary | ICD-10-CM | POA: Diagnosis not present

## 2014-12-05 LAB — BASIC METABOLIC PANEL
ANION GAP: 5 (ref 5–15)
BUN: 70 mg/dL — AB (ref 6–20)
CHLORIDE: 105 mmol/L (ref 101–111)
CO2: 26 mmol/L (ref 22–32)
Calcium: 8.4 mg/dL — ABNORMAL LOW (ref 8.9–10.3)
Creatinine, Ser: 1.7 mg/dL — ABNORMAL HIGH (ref 0.44–1.00)
GFR calc Af Amer: 40 mL/min — ABNORMAL LOW (ref >60)
GFR, EST NON AFRICAN AMERICAN: 35 mL/min — AB (ref >60)
GLUCOSE: 260 mg/dL — AB (ref 65–99)
POTASSIUM: 4.2 mmol/L (ref 3.5–5.1)
Sodium: 136 mmol/L (ref 135–145)

## 2014-12-05 LAB — GLUCOSE, CAPILLARY: GLUCOSE-CAPILLARY: 213 mg/dL — AB (ref 65–99)

## 2014-12-05 LAB — PHOSPHORUS: Phosphorus: 3.9 mg/dL (ref 2.5–4.6)

## 2014-12-05 MED ORDER — INFLUENZA VAC SPLIT QUAD 0.5 ML IM SUSY
0.5000 mL | PREFILLED_SYRINGE | Freq: Once | INTRAMUSCULAR | Status: AC
  Administered 2014-12-05: 0.5 mL via INTRAMUSCULAR

## 2014-12-05 MED ORDER — INSULIN ISOPHANE & REGULAR (HUMAN 70-30)100 UNIT/ML KWIKPEN: 60.0000 [IU] | PEN_INJECTOR | Freq: Two times a day (BID) | SUBCUTANEOUS | Status: DC

## 2014-12-05 MED ORDER — TORSEMIDE 20 MG PO TABS: 20.0000 mg | ORAL_TABLET | Freq: Every day | ORAL | Status: DC

## 2014-12-05 MED ORDER — PREDNISONE 10 MG PO TABS: 10.0000 mg | ORAL_TABLET | Freq: Every day | ORAL | Status: DC

## 2014-12-05 NOTE — Progress Notes (Signed)
Inpatient Diabetes Program Recommendations  AACE/ADA: New Consensus Statement on Inpatient Glycemic Control (2015)  Target Ranges:  Prepandial:   less than 140 mg/dL      Peak postprandial:   less than 180 mg/dL (1-2 hours)      Critically ill patients:  140 - 180 mg/dL  Results for [name], [name] (MRN [id number]) as of 12/05/2014 08:36  Ref. Range 12/04/2014 07:38 12/04/2014 11:25 12/04/2014 16:23 12/04/2014 22:05 12/05/2014 07:59  Glucose-Capillary Latest Ref Range: 65-99 mg/dL 318 (H) 279 (H) 386 (H) 423 (H) 213 (H)   Review of Glycemic Control  Diabetes history: DM2 Outpatient Diabetes medications: 70/30 48 units BID, Metformin 1000 BID, Oseni 25-15 mg daily Current orders for Inpatient glycemic control: 70/30 55 units BID  Inpatient Diabetes Program Recommendations:  Insulin - Basal: Please consider increasing 70/30 to 60 units BID. Insulin - Correction: Glucose ranged from 279-433 mg/dl on 10/6 and fasting glucose is 213 mg/dl this morning. Patient will need to receive Novolog correction in addition to 70/30 to improve glycemic control. Please order Novolog moderate correction scale ACHS.  Thanks, Barnie Alderman, RN, MSN, CCRN, CDE Diabetes Coordinator Inpatient Diabetes Program [phone] (Team Pager from [place] to [place]) [phone] ([place] office) [phone] [facility] office) [phone] [facility] office)

## 2014-12-05 NOTE — Discharge Summary (Signed)
Physician Discharge Summary  [name] [id number] DOB: 1965-12-21 DOA: 12/02/2014  PCP: [name], MD  Admit date: 12/02/2014 Discharge date: 12/05/2014  Time spent: 45 minutes  Recommendations for Outpatient Follow-up:  -Will be discharged home today. -Advised to follow up with PCP as scheduled for next week for continued diabetes management. -Will follow up with Dr. Lowanda Foster in 2 weeks for follow up of renal function.   Discharge Diagnoses:  Principal Problem:   ARF (acute renal failure) (HCC) Active Problems:   Asthma exacerbation   Diabetes mellitus type 2 in obese ([place])   Hypertension   Hyperlipidemia   Morbid obesity ([place])   Hyperglycemia   Hyponatremia   Discharge Condition: Stable and improved  Filed Weights   12/02/14 1502 12/02/14 2044  Weight: 146.557 kg (323 lb 1.6 oz) 145.968 kg (321 lb 12.8 oz)    History of present illness:  As per Dr. Marily Memos 10/4: [name] is a 49 y.o. female  Shortness of breath and wheezing. Started gradually 1 day ago. Constant. Getting worse. Albuterol twice daily since this time with minimal improvement. Denies any chest pain, fevers, productive sputum. Patient states her home glucoses avulsed been running high recently even into the 300 range typically she is in the low 200 range. No recent changes to her home diabetic regimen. Patient was increased on her diuretic medication partially 3 weeks ago due to continued fluid retention in her lower extremities. She was changed from 20 mg torsemide to 60 mg torsemide. Patient is a decreased urine output over the last couple of days.  Hospital Course:   ARF -Believed 2/2 increased torsemide dose. -Improving and back to baseline of around 1.6-1.8. -Patient also states she was getting dizzy when standing. Likely was overdiuresed. -Continue torsemide at lower dose of 20 mg on DC.  IDDM, Uncontrolled -Remains uncontrolled. -70/30 has been increased from 48  units BID to 60 units BID. -Will need continued adjustments in the OP setting.  Asthma Flare -Seems improved today. -Continue prednisone taper (2 days remaining on DC). -Continue dulera, singulair, Prn nebs.  Morbid Obesity -Noted. -Has lost close to 100 lbs, is working out at [employer].  Hyperlipidemia -Continue statin.  Peripheral Neuropathy -Continue neurontin.  Procedures:  None   Consultations:  Renal, Dr. Lowanda Foster  Discharge Instructions  Discharge Instructions    Diet - low sodium heart healthy    Complete by:  As directed      Increase activity slowly    Complete by:  As directed             Medication List    STOP taking these medications        clindamycin 300 MG capsule  Commonly known as:  CLEOCIN     HYDROcodone-acetaminophen 5-325 MG tablet  Commonly known as:  NORCO     metFORMIN 1000 MG tablet  Commonly known as:  GLUCOPHAGE     valsartan-hydrochlorothiazide 320-25 MG tablet  Commonly known as:  DIOVAN-HCT      TAKE these medications        albuterol (2.5 MG/3ML) 0.083% nebulizer solution  Commonly known as:  PROVENTIL  Take 2.5 mg by nebulization every 4 (four) hours as needed for wheezing or shortness of breath.     albuterol 108 (90 BASE) MCG/ACT inhaler  Commonly known as:  PROVENTIL HFA;VENTOLIN HFA  Inhale 2 puffs into the lungs every 6 (six) hours as needed for wheezing.     aspirin EC 81 MG  tablet  Take 81 mg by mouth daily.     atorvastatin 20 MG tablet  Commonly known as:  LIPITOR  Take 20 mg by mouth daily.     carvedilol 6.25 MG tablet  Commonly known as:  COREG  Take 6.25 mg by mouth 2 (two) times daily.     Fluticasone-Salmeterol 100-50 MCG/DOSE Aepb  Commonly known as:  ADVAIR  Inhale 1 puff into the lungs every 12 (twelve) hours as needed (for shortness of breath).     gabapentin 300 MG capsule  Commonly known as:  NEURONTIN  Take 300 mg by mouth 3 (three) times daily.     Insulin Isophane & Regular Human  (70-30) 100 UNIT/ML PEN  Commonly known as:  HUMULIN 70/30 KWIKPEN  Inject 60 Units into the skin 2 (two) times daily.     montelukast 10 MG tablet  Commonly known as:  SINGULAIR  Take 10 mg by mouth daily.     nitroGLYCERIN 0.4 MG SL tablet  Commonly known as:  NITROSTAT  Place 0.4 mg under the tongue every 5 (five) minutes as needed for chest pain.     OSENI 25-15 MG Tabs  Generic drug:  Alogliptin-Pioglitazone  Take 1 tablet by mouth daily.     predniSONE 10 MG tablet  Commonly known as:  DELTASONE  Take 1 tablet (10 mg total) by mouth daily with breakfast. Take 2 tablets 10/8 and 1 tablet 10/9     tetrahydrozoline 0.05 % ophthalmic solution  Place 1 drop into both eyes daily as needed. Dry, Itchy Eyes     torsemide 20 MG tablet  Commonly known as:  DEMADEX  Take 1 tablet (20 mg total) by mouth daily.       No Known Allergies     Follow-up Information    Follow up with [name], MD.   Specialty:  Family Medicine   Why:  as schedled for next week   Contact information:   [address] [place] [address] [phone]       Follow up with [facility] S, MD. Schedule an appointment as soon as possible for a visit in 2 weeks.   Specialty:  Nephrology   Contact information:   [address] [place] [address] [phone]        The results of significant diagnostics from this hospitalization (including imaging, microbiology, ancillary and laboratory) are listed below for reference.    Significant Diagnostic Studies: Dg Chest 2 View  12/02/2014   CLINICAL DATA:  Pt c/o of SOB since yesterday. Pt hx of asthma. Pt also states CBG this morning was 383. CBG in triage is 442. Pt states she took her insulin today.  EXAM: CHEST  2 VIEW  COMPARISON:  05/07/2014  FINDINGS: Heart size is normal. There is minimal patchy density in the lingula, possibly infectious. There is no pulmonary edema. No pleural effusions.  IMPRESSION: Atelectasis or  early infiltrate in the lingula.   Electronically Signed   By: Nolon Nations M.D.   On: 12/02/2014 16:19   US Renal  12/03/2014   CLINICAL DATA:  Acute tubular necrosis  EXAM: RENAL / URINARY TRACT ULTRASOUND COMPLETE  COMPARISON:  None.  FINDINGS: Right Kidney:  Length: 11.6 cm. Echogenicity within normal limits. No mass or hydronephrosis visualized.  Left Kidney:  Length: 10.7 cm. Echogenicity within normal limits. No mass or hydronephrosis visualized.  Bladder:  Predominantly decompressed  IMPRESSION: No acute abnormality noted.   Electronically Signed  By: Inez Catalina M.D.   On: 12/03/2014 16:00    Microbiology: No results found for this or any previous visit (from the past 240 hour(s)).   Labs: Basic Metabolic Panel:  Recent Labs Lab 12/02/14 1624 12/02/14 1631 12/03/14 0626 12/04/14 0739 12/05/14 0555  NA 129* 129* 130* 133* 136  K 4.7 4.6 4.4 4.6 4.2  CL 87* 88* 94* 99* 105  CO2 30  --  26 23 26   GLUCOSE 352* 355* 334* 361* 260*  BUN 88* 80* 86* 79* 70*  CREATININE 4.52* 4.10* 3.54* 2.32* 1.70*  CALCIUM 9.5  --  8.4* 8.6* 8.4*  MG 2.1  --   --   --   --   PHOS 5.4*  --   --   --  3.9   Liver Function Tests:  Recent Labs Lab 12/02/14 1624  AST 22  ALT 25  ALKPHOS 86  BILITOT 0.4  PROT 7.9  ALBUMIN 4.0   No results for input(s): LIPASE, AMYLASE in the last 168 hours. No results for input(s): AMMONIA in the last 168 hours. CBC:  Recent Labs Lab 12/02/14 1624 12/02/14 1631 12/03/14 0626 12/04/14 0739  WBC 10.2  --  9.0 11.4*  NEUTROABS 6.7  --   --   --   HGB 12.7 15.0 RESULT REPEATED AND VERIFIED 11.0*  HCT 37.7 44.0 35.3* 33.2*  MCV 80.6  --  79.7 81.2  PLT 157  --  99* 144*   Cardiac Enzymes: No results for input(s): CKTOTAL, CKMB, CKMBINDEX, TROPONINI in the last 168 hours. BNP: BNP (last 3 results)  Recent Labs  05/03/14 1406  BNP 122.0*    ProBNP (last 3 results) No results for input(s): PROBNP in the last 8760  hours.  CBG:  Recent Labs Lab 12/04/14 0738 12/04/14 1125 12/04/14 1623 12/04/14 2205 12/05/14 0759  GLUCAP 318* 279* 386* 423* 213*       Signed:  Lelon Frohlich  [facility] Pager: [phone] 12/05/2014, 11:01 AM

## 2014-12-05 NOTE — Progress Notes (Signed)
Subjective: Patient complains of some shortness of breath this morning and feels much better now after getting some in haler  Objective: Vital signs in last 24 hours: Temp:  [98 F (36.7 C)-98.2 F (36.8 C)] 98 F (36.7 C) (10/07 0628) Pulse Rate:  [64-74] 64 (10/07 0628) Resp:  [20] 20 (10/07 0628) BP: (122-143)/(57-59) 143/58 mmHg (10/07 0628) SpO2:  [92 %-100 %] 96 % (10/07 0747)  Intake/Output from previous day: 10/06 0701 - 10/07 0700 In: 960 [P.O.:960] Out: 700 [Urine:700] Intake/Output this shift:     Recent Labs  12/02/14 1624 12/02/14 1631 12/03/14 0626 12/04/14 0739  HGB 12.7 15.0 RESULT REPEATED AND VERIFIED 11.0*    Recent Labs  12/03/14 0626 12/04/14 0739  WBC 9.0 11.4*  RBC 4.43 4.09  HCT 35.3* 33.2*  PLT 99* 144*    Recent Labs  12/04/14 0739 12/05/14 0555  NA 133* 136  K 4.6 4.2  CL 99* 105  CO2 23 26  BUN 79* 70*  CREATININE 2.32* 1.70*  GLUCOSE 361* 260*  CALCIUM 8.6* 8.4*   No results for input(s): LABPT, INR in the last 72 hours.  Generally patient is alert and in no apparent distress. Chest decreased breath sounds bilaterally, no rales, rhonchi or egophony. Heart: Distant S1 and S2, regular rate and rhythm no murmur Extremities possibly trace edema  Assessment/Plan: Problem #1 acute kidney injury: Possibly combination of prerenal syndrome/ATN/ARBS. Her BUN and creatinine presently is improving. Patient is also nonoliguric. Her renal function continued to improve. Presently has returned to her baseline. Problem #2 difficulty in breathing: Most likely from exacerbation of her asthma and presently patient is feeling much better. Problem #3 hypervolemic hyponatremia: Sodium has corrected Problem #4 hypertension: Her blood pressure is reasonably controlled Problem #5 chronic renal failure: Stage III. Thought to be secondary to diabetes/hypertension/obesity related glomerulopathy /recurrent AK I. Problem #6 morbid obesity Problem #7  diabetes: Her blood sugar is poorly controlled. Problem #8 metabolic bone disease: Her calcium and phosphorus is in range. Plan: 1] We'll DC IV fluid 2] We'll check her basic metabolic panel in the morning. 3] If patient is going to be discharged she will be followed has an outpatient. Patient advised to keep her previous appointment.Marland Kitchen   Iman Orourke S 12/05/2014, 9:47 AM

## 2014-12-05 NOTE — Care Management Important Message (Signed)
Important Message  Patient Details  Name: RIESE LOUNSBERRY MRN: [id number] Date of Birth: 08-21-1965   Medicare Important Message Given:  N/A - LOS <3 / Initial given by admissions    Sherald Barge, RN 12/05/2014, 11:28 AM

## 2014-12-05 NOTE — Care Management Note (Signed)
Case Management Note  Patient Details  Name: [name] MRN: [id number] Date of Birth: May 03, 1965  Expected Discharge Date:  12/05/14               Expected Discharge Plan:  Home/Self Care  In-House Referral:  NA  Discharge planning Services  CM Consult  Post Acute Care Choice:  NA Choice offered to:  NA  DME Arranged:    DME Agency:     HH Arranged:    [place] Agency:     Status of Service:  Completed, signed off  Medicare Important Message Given:  N/A - LOS <3 / Initial given by admissions Date Medicare IM Given:    Medicare IM give by:    Date Additional Medicare IM Given:    Additional Medicare Important Message give by:     If discussed at [place] of Stay Meetings, dates discussed:    Additional Comments: Pt discharging home with self care today. No CM needs.  Sherald Barge, RN 12/05/2014, 11:28 AM

## 2014-12-09 DIAGNOSIS — Z6841 Body Mass Index (BMI) 40.0 and over, adult: Secondary | ICD-10-CM | POA: Diagnosis not present

## 2014-12-09 DIAGNOSIS — E11649 Type 2 diabetes mellitus with hypoglycemia without coma: Secondary | ICD-10-CM | POA: Diagnosis not present

## 2014-12-11 DIAGNOSIS — J9601 Acute respiratory failure with hypoxia: Secondary | ICD-10-CM | POA: Diagnosis not present

## 2014-12-15 ENCOUNTER — Ambulatory Visit (HOSPITAL_BASED_OUTPATIENT_CLINIC_OR_DEPARTMENT_OTHER): Payer: Medicare Other | Attending: Family Medicine | Admitting: Radiology

## 2014-12-15 VITALS — Ht 61.0 in | Wt 334.0 lb

## 2014-12-15 DIAGNOSIS — R0683 Snoring: Secondary | ICD-10-CM | POA: Diagnosis not present

## 2014-12-15 DIAGNOSIS — E119 Type 2 diabetes mellitus without complications: Secondary | ICD-10-CM | POA: Insufficient documentation

## 2014-12-15 DIAGNOSIS — G4733 Obstructive sleep apnea (adult) (pediatric): Secondary | ICD-10-CM | POA: Insufficient documentation

## 2014-12-15 DIAGNOSIS — I10 Essential (primary) hypertension: Secondary | ICD-10-CM | POA: Diagnosis not present

## 2014-12-15 DIAGNOSIS — I493 Ventricular premature depolarization: Secondary | ICD-10-CM | POA: Diagnosis not present

## 2014-12-15 DIAGNOSIS — Z6841 Body Mass Index (BMI) 40.0 and over, adult: Secondary | ICD-10-CM | POA: Insufficient documentation

## 2014-12-15 DIAGNOSIS — E669 Obesity, unspecified: Secondary | ICD-10-CM | POA: Insufficient documentation

## 2014-12-18 DIAGNOSIS — D509 Iron deficiency anemia, unspecified: Secondary | ICD-10-CM | POA: Diagnosis not present

## 2014-12-18 DIAGNOSIS — D649 Anemia, unspecified: Secondary | ICD-10-CM | POA: Diagnosis not present

## 2014-12-18 DIAGNOSIS — N183 Chronic kidney disease, stage 3 (moderate): Secondary | ICD-10-CM | POA: Diagnosis not present

## 2014-12-18 DIAGNOSIS — R809 Proteinuria, unspecified: Secondary | ICD-10-CM | POA: Diagnosis not present

## 2014-12-18 DIAGNOSIS — I10 Essential (primary) hypertension: Secondary | ICD-10-CM | POA: Diagnosis not present

## 2014-12-20 ENCOUNTER — Encounter (HOSPITAL_BASED_OUTPATIENT_CLINIC_OR_DEPARTMENT_OTHER): Payer: Medicare Other | Admitting: Internal Medicine

## 2014-12-20 DIAGNOSIS — G4733 Obstructive sleep apnea (adult) (pediatric): Secondary | ICD-10-CM | POA: Diagnosis not present

## 2014-12-21 ENCOUNTER — Encounter (HOSPITAL_COMMUNITY): Payer: Self-pay | Admitting: Emergency Medicine

## 2014-12-21 ENCOUNTER — Other Ambulatory Visit: Payer: Self-pay

## 2014-12-21 ENCOUNTER — Other Ambulatory Visit (HOSPITAL_COMMUNITY): Payer: Self-pay

## 2014-12-21 ENCOUNTER — Emergency Department (HOSPITAL_COMMUNITY): Payer: Medicare Other

## 2014-12-21 ENCOUNTER — Observation Stay (HOSPITAL_COMMUNITY)
Admission: EM | Admit: 2014-12-21 | Discharge: 2014-12-22 | Disposition: A | Discharge status: Home / Self Care | Payer: Medicare Other | Attending: Internal Medicine | Admitting: Internal Medicine

## 2014-12-21 DIAGNOSIS — E1122 Type 2 diabetes mellitus with diabetic chronic kidney disease: Secondary | ICD-10-CM | POA: Diagnosis not present

## 2014-12-21 DIAGNOSIS — J9620 Acute and chronic respiratory failure, unspecified whether with hypoxia or hypercapnia: Secondary | ICD-10-CM | POA: Diagnosis not present

## 2014-12-21 DIAGNOSIS — E1165 Type 2 diabetes mellitus with hyperglycemia: Secondary | ICD-10-CM | POA: Diagnosis not present

## 2014-12-21 DIAGNOSIS — E119 Type 2 diabetes mellitus without complications: Secondary | ICD-10-CM | POA: Diagnosis not present

## 2014-12-21 DIAGNOSIS — Z79899 Other long term (current) drug therapy: Secondary | ICD-10-CM | POA: Insufficient documentation

## 2014-12-21 DIAGNOSIS — J4541 Moderate persistent asthma with (acute) exacerbation: Secondary | ICD-10-CM

## 2014-12-21 DIAGNOSIS — I130 Hypertensive heart and chronic kidney disease with heart failure and stage 1 through stage 4 chronic kidney disease, or unspecified chronic kidney disease: Secondary | ICD-10-CM | POA: Insufficient documentation

## 2014-12-21 DIAGNOSIS — R0602 Shortness of breath: Secondary | ICD-10-CM | POA: Diagnosis not present

## 2014-12-21 DIAGNOSIS — Z7982 Long term (current) use of aspirin: Secondary | ICD-10-CM | POA: Insufficient documentation

## 2014-12-21 DIAGNOSIS — J45901 Unspecified asthma with (acute) exacerbation: Secondary | ICD-10-CM | POA: Diagnosis not present

## 2014-12-21 DIAGNOSIS — Z7951 Long term (current) use of inhaled steroids: Secondary | ICD-10-CM | POA: Insufficient documentation

## 2014-12-21 DIAGNOSIS — I10 Essential (primary) hypertension: Secondary | ICD-10-CM | POA: Diagnosis present

## 2014-12-21 DIAGNOSIS — Z794 Long term (current) use of insulin: Secondary | ICD-10-CM | POA: Insufficient documentation

## 2014-12-21 DIAGNOSIS — I5032 Chronic diastolic (congestive) heart failure: Secondary | ICD-10-CM | POA: Diagnosis not present

## 2014-12-21 DIAGNOSIS — J9601 Acute respiratory failure with hypoxia: Secondary | ICD-10-CM | POA: Diagnosis present

## 2014-12-21 DIAGNOSIS — N183 Chronic kidney disease, stage 3 unspecified: Secondary | ICD-10-CM | POA: Diagnosis present

## 2014-12-21 DIAGNOSIS — E669 Obesity, unspecified: Secondary | ICD-10-CM

## 2014-12-21 DIAGNOSIS — R05 Cough: Secondary | ICD-10-CM | POA: Diagnosis not present

## 2014-12-21 DIAGNOSIS — Z6841 Body Mass Index (BMI) 40.0 and over, adult: Secondary | ICD-10-CM | POA: Diagnosis not present

## 2014-12-21 DIAGNOSIS — E785 Hyperlipidemia, unspecified: Secondary | ICD-10-CM | POA: Diagnosis not present

## 2014-12-21 DIAGNOSIS — E1169 Type 2 diabetes mellitus with other specified complication: Secondary | ICD-10-CM

## 2014-12-21 HISTORY — DX: Unspecified asthma with (acute) exacerbation: J45.901

## 2014-12-21 LAB — I-STAT TROPONIN, ED: TROPONIN I, POC: 0 ng/mL (ref 0.00–0.08)

## 2014-12-21 LAB — CBC WITH DIFFERENTIAL/PLATELET
BASOS ABS: 0 10*3/uL (ref 0.0–0.1)
BASOS PCT: 0 %
EOS ABS: 0.1 10*3/uL (ref 0.0–0.7)
EOS PCT: 1 %
HCT: 30.4 % — ABNORMAL LOW (ref 36.0–46.0)
Hemoglobin: 9.3 g/dL — ABNORMAL LOW (ref 12.0–15.0)
Lymphocytes Relative: 14 %
Lymphs Abs: 1.3 10*3/uL (ref 0.7–4.0)
MCH: 26.1 pg (ref 26.0–34.0)
MCHC: 30.6 g/dL (ref 30.0–36.0)
MCV: 85.4 fL (ref 78.0–100.0)
MONO ABS: 0.7 10*3/uL (ref 0.1–1.0)
Monocytes Relative: 8 %
Neutro Abs: 7.7 10*3/uL (ref 1.7–7.7)
Neutrophils Relative %: 77 %
PLATELETS: 126 10*3/uL — AB (ref 150–400)
RBC: 3.56 MIL/uL — AB (ref 3.87–5.11)
RDW: 14.7 % (ref 11.5–15.5)
WBC: 9.9 10*3/uL (ref 4.0–10.5)

## 2014-12-21 LAB — BASIC METABOLIC PANEL
ANION GAP: 10 (ref 5–15)
BUN: 43 mg/dL — ABNORMAL HIGH (ref 6–20)
CALCIUM: 9.1 mg/dL (ref 8.9–10.3)
CO2: 26 mmol/L (ref 22–32)
CREATININE: 1.57 mg/dL — AB (ref 0.44–1.00)
Chloride: 101 mmol/L (ref 101–111)
GFR, EST AFRICAN AMERICAN: 44 mL/min — AB (ref >60)
GFR, EST NON AFRICAN AMERICAN: 38 mL/min — AB (ref >60)
Glucose, Bld: 390 mg/dL — ABNORMAL HIGH (ref 65–99)
Potassium: 4.5 mmol/L (ref 3.5–5.1)
SODIUM: 137 mmol/L (ref 135–145)

## 2014-12-21 LAB — GLUCOSE, CAPILLARY

## 2014-12-21 LAB — CBG MONITORING, ED: GLUCOSE-CAPILLARY: 441 mg/dL — AB (ref 65–99)

## 2014-12-21 LAB — TSH: TSH: 3.363 u[IU]/mL (ref 0.350–4.500)

## 2014-12-21 LAB — BRAIN NATRIURETIC PEPTIDE: B NATRIURETIC PEPTIDE 5: 523 pg/mL — AB (ref 0.0–100.0)

## 2014-12-21 MED ORDER — GABAPENTIN 300 MG PO CAPS
300.0000 mg | ORAL_CAPSULE | Freq: Three times a day (TID) | ORAL | Status: DC
  Administered 2014-12-21 – 2014-12-22 (×2): 300 mg via ORAL
  Filled 2014-12-21 (×2): qty 1

## 2014-12-21 MED ORDER — ATORVASTATIN CALCIUM 20 MG PO TABS
20.0000 mg | ORAL_TABLET | Freq: Every day | ORAL | Status: DC
  Filled 2014-12-21 (×2): qty 1

## 2014-12-21 MED ORDER — IPRATROPIUM BROMIDE 0.02 % IN SOLN: 0.5000 mg | Freq: Once | RESPIRATORY_TRACT | Status: DC

## 2014-12-21 MED ORDER — ONDANSETRON HCL 4 MG PO TABS: 4.0000 mg | ORAL_TABLET | Freq: Four times a day (QID) | ORAL | Status: DC | PRN

## 2014-12-21 MED ORDER — FUROSEMIDE 10 MG/ML IJ SOLN
40.0000 mg | Freq: Once | INTRAMUSCULAR | Status: AC
  Administered 2014-12-21: 40 mg via INTRAVENOUS
  Filled 2014-12-21: qty 4

## 2014-12-21 MED ORDER — INSULIN ASPART 100 UNIT/ML ~~LOC~~ SOLN
0.0000 [IU] | Freq: Three times a day (TID) | SUBCUTANEOUS | Status: DC
  Administered 2014-12-22: 7 [IU] via SUBCUTANEOUS
  Administered 2014-12-22: 11 [IU] via SUBCUTANEOUS

## 2014-12-21 MED ORDER — METHYLPREDNISOLONE SODIUM SUCC 125 MG IJ SOLR
80.0000 mg | Freq: Four times a day (QID) | INTRAMUSCULAR | Status: DC
  Administered 2014-12-21: 80 mg via INTRAVENOUS
  Filled 2014-12-21: qty 2

## 2014-12-21 MED ORDER — MOMETASONE FURO-FORMOTEROL FUM 100-5 MCG/ACT IN AERO
INHALATION_SPRAY | RESPIRATORY_TRACT | Status: AC
  Filled 2014-12-21: qty 8.8

## 2014-12-21 MED ORDER — MONTELUKAST SODIUM 10 MG PO TABS
10.0000 mg | ORAL_TABLET | Freq: Every day | ORAL | Status: DC
  Administered 2014-12-21 – 2014-12-22 (×2): 10 mg via ORAL
  Filled 2014-12-21 (×2): qty 1

## 2014-12-21 MED ORDER — ALBUTEROL SULFATE (2.5 MG/3ML) 0.083% IN NEBU
2.5000 mg | INHALATION_SOLUTION | Freq: Four times a day (QID) | RESPIRATORY_TRACT | Status: DC | PRN
  Administered 2014-12-22: 2.5 mg via RESPIRATORY_TRACT
  Filled 2014-12-21: qty 3

## 2014-12-21 MED ORDER — ALOGLIPTIN-PIOGLITAZONE 25-15 MG PO TABS: 1.0000 | ORAL_TABLET | Freq: Every day | ORAL | Status: DC

## 2014-12-21 MED ORDER — PREDNISONE 10 MG PO TABS: 40.0000 mg | ORAL_TABLET | Freq: Every day | ORAL | Status: DC

## 2014-12-21 MED ORDER — ALBUTEROL SULFATE (2.5 MG/3ML) 0.083% IN NEBU: 2.5000 mg | INHALATION_SOLUTION | RESPIRATORY_TRACT | Status: DC | PRN

## 2014-12-21 MED ORDER — INSULIN ISOPHANE & REGULAR (HUMAN 70-30)100 UNIT/ML KWIKPEN: 60.0000 [IU] | PEN_INJECTOR | Freq: Two times a day (BID) | SUBCUTANEOUS | Status: DC

## 2014-12-21 MED ORDER — CARVEDILOL 3.125 MG PO TABS
6.2500 mg | ORAL_TABLET | Freq: Two times a day (BID) | ORAL | Status: DC
  Administered 2014-12-21 – 2014-12-22 (×2): 6.25 mg via ORAL
  Filled 2014-12-21 (×2): qty 2

## 2014-12-21 MED ORDER — INSULIN ASPART 100 UNIT/ML ~~LOC~~ SOLN
15.0000 [IU] | Freq: Once | SUBCUTANEOUS | Status: AC
  Administered 2014-12-21: 15 [IU] via SUBCUTANEOUS

## 2014-12-21 MED ORDER — ALBUTEROL SULFATE HFA 108 (90 BASE) MCG/ACT IN AERS
2.0000 | INHALATION_SPRAY | Freq: Four times a day (QID) | RESPIRATORY_TRACT | Status: DC | PRN
Start: 2014-12-21 — End: 2014-12-21
  Filled 2014-12-21: qty 6.7

## 2014-12-21 MED ORDER — IPRATROPIUM BROMIDE 0.02 % IN SOLN
0.5000 mg | Freq: Once | RESPIRATORY_TRACT | Status: AC
  Administered 2014-12-21: 0.5 mg via RESPIRATORY_TRACT
  Filled 2014-12-21: qty 2.5

## 2014-12-21 MED ORDER — NAPHAZOLINE HCL 0.1 % OP SOLN
1.0000 [drp] | Freq: Four times a day (QID) | OPHTHALMIC | Status: DC | PRN
  Filled 2014-12-21: qty 15

## 2014-12-21 MED ORDER — ALBUTEROL SULFATE (2.5 MG/3ML) 0.083% IN NEBU: 5.0000 mg | INHALATION_SOLUTION | Freq: Once | RESPIRATORY_TRACT | Status: DC

## 2014-12-21 MED ORDER — ASPIRIN EC 81 MG PO TBEC
81.0000 mg | DELAYED_RELEASE_TABLET | Freq: Every day | ORAL | Status: DC
  Administered 2014-12-21 – 2014-12-22 (×2): 81 mg via ORAL
  Filled 2014-12-21 (×3): qty 1

## 2014-12-21 MED ORDER — INSULIN ASPART PROT & ASPART (70-30 MIX) 100 UNIT/ML ~~LOC~~ SUSP
50.0000 [IU] | Freq: Once | SUBCUTANEOUS | Status: AC
  Administered 2014-12-21: 50 [IU] via SUBCUTANEOUS
  Filled 2014-12-21: qty 10

## 2014-12-21 MED ORDER — INSULIN ASPART PROT & ASPART (70-30 MIX) 100 UNIT/ML ~~LOC~~ SUSP
SUBCUTANEOUS | Status: AC
  Filled 2014-12-21: qty 10

## 2014-12-21 MED ORDER — IPRATROPIUM-ALBUTEROL 0.5-2.5 (3) MG/3ML IN SOLN
3.0000 mL | Freq: Once | RESPIRATORY_TRACT | Status: AC
  Administered 2014-12-21: 3 mL via RESPIRATORY_TRACT
  Filled 2014-12-21: qty 3

## 2014-12-21 MED ORDER — TORSEMIDE 20 MG PO TABS
20.0000 mg | ORAL_TABLET | Freq: Every day | ORAL | Status: DC
  Administered 2014-12-22: 20 mg via ORAL
  Filled 2014-12-21: qty 1

## 2014-12-21 MED ORDER — INSULIN ASPART 100 UNIT/ML ~~LOC~~ SOLN: 0.0000 [IU] | Freq: Every day | SUBCUTANEOUS | Status: DC

## 2014-12-21 MED ORDER — ONDANSETRON HCL 4 MG/2ML IJ SOLN: 4.0000 mg | Freq: Four times a day (QID) | INTRAMUSCULAR | Status: DC | PRN

## 2014-12-21 MED ORDER — ALBUTEROL (5 MG/ML) CONTINUOUS INHALATION SOLN
10.0000 mg/h | INHALATION_SOLUTION | Freq: Once | RESPIRATORY_TRACT | Status: AC
  Administered 2014-12-21: 10 mg/h via RESPIRATORY_TRACT
  Filled 2014-12-21: qty 20

## 2014-12-21 MED ORDER — NITROGLYCERIN 0.4 MG SL SUBL: 0.4000 mg | SUBLINGUAL_TABLET | SUBLINGUAL | Status: DC | PRN

## 2014-12-21 MED ORDER — METHYLPREDNISOLONE SODIUM SUCC 125 MG IJ SOLR
125.0000 mg | Freq: Once | INTRAMUSCULAR | Status: AC
  Administered 2014-12-21: 125 mg via INTRAVENOUS
  Filled 2014-12-21: qty 2

## 2014-12-21 MED ORDER — MAGNESIUM SULFATE 2 GM/50ML IV SOLN
2.0000 g | Freq: Once | INTRAVENOUS | Status: AC
  Administered 2014-12-21: 2 g via INTRAVENOUS
  Filled 2014-12-21: qty 50

## 2014-12-21 MED ORDER — MOMETASONE FURO-FORMOTEROL FUM 100-5 MCG/ACT IN AERO
2.0000 | INHALATION_SPRAY | Freq: Two times a day (BID) | RESPIRATORY_TRACT | Status: DC
  Administered 2014-12-21 – 2014-12-22 (×2): 2 via RESPIRATORY_TRACT
  Filled 2014-12-21: qty 8.8

## 2014-12-21 MED ORDER — ALBUTEROL SULFATE (2.5 MG/3ML) 0.083% IN NEBU: 10.0000 mg | INHALATION_SOLUTION | Freq: Once | RESPIRATORY_TRACT | Status: DC

## 2014-12-21 MED ORDER — ALBUTEROL SULFATE (2.5 MG/3ML) 0.083% IN NEBU: 2.5000 mg | INHALATION_SOLUTION | Freq: Four times a day (QID) | RESPIRATORY_TRACT | Status: DC | PRN

## 2014-12-21 MED ORDER — ENOXAPARIN SODIUM 40 MG/0.4ML ~~LOC~~ SOLN
40.0000 mg | SUBCUTANEOUS | Status: DC
  Administered 2014-12-21: 40 mg via SUBCUTANEOUS
  Filled 2014-12-21: qty 0.4

## 2014-12-21 MED ORDER — ALBUTEROL SULFATE (2.5 MG/3ML) 0.083% IN NEBU
2.5000 mg | INHALATION_SOLUTION | Freq: Once | RESPIRATORY_TRACT | Status: AC
  Administered 2014-12-21: 2.5 mg via RESPIRATORY_TRACT
  Filled 2014-12-21: qty 3

## 2014-12-21 NOTE — H&P (Signed)
[facility] History and Physical  [name] [id number] DOB: 1965/08/30 DOA: 12/21/2014  Referring physician: ER PCP: [name], MD   Chief Complaint: Dyspnea  HPI: [name] is a 49 y.o. female  This is a 49 year old lady, morbidly obese, diabetic, hypertensive, previous history of congestive heart failure and asthma who now presents with a 24-hour history of dyspnea. She says that her oxygen, which she takes daily at 2 L/m, somehow ran out or was malfunctioning. She denies any cough or fever. Albuterol seems to have made her feel better in the emergency room. She denies any chest pain or limb weakness or limb pain. She is now being admitted for further management.   Review of Systems:  Apart from symptoms above, all systems are negative.  Past Medical History  Diagnosis Date  . Asthma   . Hypertension   . Diabetes mellitus   . Hyperlipidemia   . History of echocardiogram [id number]    LVH, diastolic dysfunction   Past Surgical History  Procedure Laterality Date  . Tracheostomy      at age 16 from asthma attack  . Cataract extraction w/phaco Right 12/10/2012    Procedure: CATARACT EXTRACTION PHACO AND INTRAOCULAR LENS PLACEMENT (IOC);  Surgeon: Tonny Branch, MD;  Location: [facility];  Service: Ophthalmology;  Laterality: Right;  CDE:22..42  . Left heart catheterization with coronary angiogram N/A 04/09/2013    Procedure: LEFT HEART CATHETERIZATION WITH CORONARY ANGIOGRAM;  Surgeon: Laverda Page, MD;  Location: [facility] CATH LAB;  Service: Cardiovascular;  Laterality: N/A;  . Cardiac catheterization  03/2013    normal coronary arteries, EF 55%  . Eye surgery Left   . Cardiac catheterization  03/2013    normal coronary arteries   Social History:  reports that she has never smoked. She has never used smokeless tobacco. She reports that she drinks alcohol. She reports that she does not use illicit drugs.  No Known Allergies  Family History  Problem  Relation Age of Onset  . Diabetes Mother       Prior to Admission medications   Medication Sig Start Date End Date Taking? Authorizing Provider  albuterol (PROVENTIL HFA;VENTOLIN HFA) 108 (90 BASE) MCG/ACT inhaler Inhale 2 puffs into the lungs every 6 (six) hours as needed for wheezing.   Yes Historical Provider, MD  albuterol (PROVENTIL) (2.5 MG/3ML) 0.083% nebulizer solution Take 2.5 mg by nebulization every 4 (four) hours as needed for wheezing or shortness of breath.   Yes Historical Provider, MD  Alogliptin-Pioglitazone (OSENI) 25-15 MG TABS Take 1 tablet by mouth daily.   Yes Historical Provider, MD  aspirin EC 81 MG tablet Take 81 mg by mouth daily.    Yes Historical Provider, MD  atorvastatin (LIPITOR) 20 MG tablet Take 20 mg by mouth daily.    Yes Historical Provider, MD  carvedilol (COREG) 6.25 MG tablet Take 6.25 mg by mouth 2 (two) times daily.  11/08/13  Yes Historical Provider, MD  Fluticasone-Salmeterol (ADVAIR) 100-50 MCG/DOSE AEPB Inhale 1 puff into the lungs every 12 (twelve) hours as needed (for shortness of breath).    Yes Historical Provider, MD  gabapentin (NEURONTIN) 300 MG capsule Take 300 mg by mouth 3 (three) times daily.    Yes Historical Provider, MD  Insulin Isophane & Regular Human (HUMULIN 70/30 KWIKPEN) (70-30) 100 UNIT/ML PEN Inject 60 Units into the skin 2 (two) times daily. 12/05/14  Yes Erline Hau, MD  montelukast (SINGULAIR) 10 MG tablet Take 10 mg by mouth  daily.    Yes Historical Provider, MD  nitroGLYCERIN (NITROSTAT) 0.4 MG SL tablet Place 0.4 mg under the tongue every 5 (five) minutes as needed for chest pain.   Yes Historical Provider, MD  tetrahydrozoline 0.05 % ophthalmic solution Place 1 drop into both eyes daily as needed. Dry, Itchy Eyes   Yes Historical Provider, MD  torsemide (DEMADEX) 20 MG tablet Take 1 tablet (20 mg total) by mouth daily. 12/05/14  Yes Erline Hau, MD  albuterol (PROVENTIL) (2.5 MG/3ML) 0.083% nebulizer  solution Take 3 mLs (2.5 mg total) by nebulization every 6 (six) hours as needed for wheezing or shortness of breath. 12/21/14   Shawn C Joy, PA-C  predniSONE (DELTASONE) 10 MG tablet Take 4 tablets (40 mg total) by mouth daily. 12/21/14   Lorayne Bender, PA-C   Physical Exam: Filed Vitals:   12/21/14 1800 12/21/14 1820 12/21/14 1830 12/21/14 1900  BP: 147/65 147/65 148/119   Pulse: 83 86 87 107  Temp:      TempSrc:      Resp: 17 26 29 22   Height:      Weight:      SpO2: 98% 100% 97% 96%    Wt Readings from Last 3 Encounters:  12/21/14 155.13 kg (342 lb)  12/15/14 151.501 kg (334 lb)  12/02/14 145.968 kg (321 lb 12.8 oz)    General:  Appears dyspneic on minimal exertion. Morbidly obese. Not septic clinically. Eyes: PERRL, normal lids, irises & conjunctiva ENT: grossly normal hearing, lips & tongue Neck: no LAD, masses or thyromegaly Cardiovascular: RRR, no m/r/g. No LE edema. Telemetry: SR, no arrhythmias  Respiratory: Clear lung fields. No wheezing or crackles or bronchial breathing. Abdomen: soft, ntnd Skin: no rash or induration seen on limited exam Musculoskeletal: grossly normal tone BUE/BLE Psychiatric: grossly normal mood and affect, speech fluent and appropriate Neurologic: grossly non-focal.          Labs on Admission:  Basic Metabolic Panel:  Recent Labs Lab 12/21/14 1415  NA 137  K 4.5  CL 101  CO2 26  GLUCOSE 390*  BUN 43*  CREATININE 1.57*  CALCIUM 9.1   Liver Function Tests: No results for input(s): AST, ALT, ALKPHOS, BILITOT, PROT, ALBUMIN in the last 168 hours. No results for input(s): LIPASE, AMYLASE in the last 168 hours. No results for input(s): AMMONIA in the last 168 hours. CBC:  Recent Labs Lab 12/21/14 1415  WBC 9.9  NEUTROABS 7.7  HGB 9.3*  HCT 30.4*  MCV 85.4  PLT 126*   Cardiac Enzymes: No results for input(s): CKTOTAL, CKMB, CKMBINDEX, TROPONINI in the last 168 hours.  BNP (last 3 results)  Recent Labs  05/03/14 1406  12/21/14 1415  BNP 122.0* 523.0*    ProBNP (last 3 results) No results for input(s): PROBNP in the last 8760 hours.  CBG: No results for input(s): GLUCAP in the last 168 hours.  Radiological Exams on Admission: Dg Chest 2 View  12/21/2014  CLINICAL DATA:  Shortness of breath and cough 2 days. EXAM: CHEST  2 VIEW COMPARISON:  12/02/2014 FINDINGS: Lungs are adequately inflated with new prominence of the perihilar markings bilaterally suggesting mild interstitial edema. No evidence of effusion. Borderline stable cardiomegaly. Remainder of the exam is unchanged. IMPRESSION: Findings suggesting mild interstitial edema and less likely infection. Borderline stable cardiomegaly. Electronically Signed   By: Marin Olp M.D.   On: 12/21/2014 13:37      Assessment/Plan   1. Acute asthma exacerbation. She will be  treated with IV steroids, bronchodilators. 2. Possible congestive heart failure exacerbation. Continue with diuretics. Monitor closely. 3. Diabetes. Continue home medications and sliding scale of insulin. 4. Hypertension. Continue home medications. 5. Chronic kidney disease. Appears to be stable. 6. Morbid obesity. I discussed briefly nutrition to help with weight loss.  She'll be admitted to the medical floor. Further recommendations will depend on patient's hospital progress.  Code Status: Full code.  DVT Prophylaxis: Lovenox.  Family Communication: I discussed the plan with the patient at the bedside.  Disposition Plan: Home when medically stable.   Time spent: 60 minutes.  Doree Albee [facility] Pager [phone].

## 2014-12-21 NOTE — Progress Notes (Signed)
Patients CBG is reading Hi on the monitor, exceeds measure, greater than 600, paged on-call MD will follow new orders received, and continue to monitor the patient.

## 2014-12-21 NOTE — Discharge Instructions (Signed)
You have been seen today for shortness of breath. Your labs and xray are free from abnormalities. You are encouraged to follow up with your PCP as needed. Return to ED should symptoms worsen.   Shortness of Breath Shortness of breath means you have trouble breathing. It could also mean that you have a medical problem. You should get immediate medical care for shortness of breath. CAUSES   Not enough oxygen in the air such as with high altitudes or a smoke-filled room.  Certain lung diseases, infections, or problems.  Heart disease or conditions, such as angina or heart failure.  Low red blood cells (anemia).  Poor physical fitness, which can cause shortness of breath when you exercise.  Chest or back injuries or stiffness.  Being overweight.  Smoking.  Anxiety, which can make you feel like you are not getting enough air. DIAGNOSIS  Serious medical problems can often be found during your physical exam. Tests may also be done to determine why you are having shortness of breath. Tests may include:  Chest X-rays.  Lung function tests.  Blood tests.  An electrocardiogram (ECG).  An ambulatory electrocardiogram. An ambulatory ECG records your heartbeat patterns over a 24-hour period.  Exercise testing.  A transthoracic echocardiogram (TTE). During echocardiography, sound waves are used to evaluate how blood flows through your heart.  A transesophageal echocardiogram (TEE).  Imaging scans. Your health care provider may not be able to find a cause for your shortness of breath after your exam. In this case, it is important to have a follow-up exam with your health care provider as directed.  TREATMENT  Treatment for shortness of breath depends on the cause of your symptoms and can vary greatly. HOME CARE INSTRUCTIONS   Do not smoke. Smoking is a common cause of shortness of breath. If you smoke, ask for help to quit.  Avoid being around chemicals or things that may bother  your breathing, such as paint fumes and dust.  Rest as needed. Slowly resume your usual activities.  If medicines were prescribed, take them as directed for the full length of time directed. This includes oxygen and any inhaled medicines.  Keep all follow-up appointments as directed by your health care provider. SEEK MEDICAL CARE IF:   Your condition does not improve in the time expected.  You have a hard time doing your normal activities even with rest.  You have any new symptoms. SEEK IMMEDIATE MEDICAL CARE IF:   Your shortness of breath gets worse.  You feel light-headed, faint, or develop a cough not controlled with medicines.  You start coughing up blood.  You have pain with breathing.  You have chest pain or pain in your arms, shoulders, or abdomen.  You have a fever.  You are unable to walk up stairs or exercise the way you normally do. MAKE SURE YOU:  Understand these instructions.  Will watch your condition.  Will get help right away if you are not doing well or get worse.   This information is not intended to replace advice given to you by your health care provider. Make sure you discuss any questions you have with your health care provider.   Document Released: 11/09/2000 Document Revised: 02/19/2013 Document Reviewed: 05/02/2011 Elsevier Interactive Patient Education [employer].

## 2014-12-21 NOTE — ED Notes (Signed)
Patient and family given a drink at this time.

## 2014-12-21 NOTE — ED Notes (Signed)
Patient given ginger ale and ice cream, patient stated no N/V but c/o mild epigastric pain after eating.

## 2014-12-21 NOTE — Progress Notes (Signed)
  Patient Name: [name], [name] Date: 12/15/2014 Gender: Female D.O.B: 1966/01/30 Age (years): 49 Referring Provider: Iona Beard Height (inches): 61 Interpreting Physician: Baird Lyons MD, ABSM Weight (lbs): 334 RPSGT: Carolin Coy BMI: 29 MRN: [id number] Neck Size: 17.00 CLINICAL INFORMATION Sleep Study Type: NPSG Indication for sleep study: Diabetes, Hypertension, Obesity, OSA, Snoring Epworth Sleepiness Score: 1  SLEEP STUDY TECHNIQUE As per the AASM Manual for the Scoring of Sleep and Associated Events v2.3 (April 2016) with a hypopnea requiring 4% desaturations. The channels recorded and monitored were frontal, central and occipital EEG, electrooculogram (EOG), submentalis EMG (chin), nasal and oral airflow, thoracic and abdominal wall motion, anterior tibialis EMG, snore microphone, electrocardiogram, and pulse oximetry.  MEDICATIONS Patient's medications include: charted for review. Medications self-administered by patient during sleep study : Coreg,gabapentin, humulin,  SLEEP ARCHITECTURE The study was initiated at 10:47:57 PM and ended at 4:42:24 AM. Sleep onset time was 19.9 minutes and the sleep efficiency was 44.9%. The total sleep time was 159.0 minutes. Stage REM latency was N/A minutes. The patient spent 19.50% of the night in stage N1 sleep, 80.50% in stage N2 sleep, 0.00% in stage N3 and 0.00% in REM. Alpha intrusion was absent. Supine sleep was 29.23%.  RESPIRATORY PARAMETERS The overall apnea/hypopnea index (AHI) was 1.1 per hour. There were 3 total apneas, including 3 obstructive, 0 central and 0 mixed apneas. There were 0 hypopneas and 7 RERAs. The AHI during Stage REM sleep was N/A per hour. AHI while supine was 0.0 per hour. The mean oxygen saturation was 99.47%. The minimum SpO2 during sleep was 96.00%. The patient wore O2 2L as ordered. Moderate snoring was noted during this study. Wake after sleep onset 175.5 minutes  CARDIAC  DATA The 2 lead EKG demonstrated sinus rhythm. The mean heart rate was 75.65 beats per minute. Other EKG findings include: PVCs.  LEG MOVEMENT DATA The total PLMS were 0 with a resulting PLMS index of 0.00. Associated arousal with leg movement index was 0.0 .  IMPRESSIONS - No significant obstructive sleep apnea occurred during this study (AHI = 1.1/h). - No significant central sleep apnea occurred during this study (CAI = 0.0/h). - Mild oxygen desaturation was noted during this study (Min O2 = 96.00%). The patient wore O2 2L/m as ordered. - The patient snored with Moderate snoring volume. - EKG findings include PVCs. - Clinically significant periodic limb movements did not occur during sleep. No significant associated arousals.  DIAGNOSIS - Primary Snoring (786.09 [R06.83 ICD-10]) - Normal study  RECOMMENDATIONS - Avoid alcohol, sedatives and other CNS depressants that may worsen sleep apnea and disrupt normal sleep architecture. - Sleep hygiene should be reviewed to assess factors that may improve sleep quality. - Weight management and regular exercise should be initiated or continued if appropriate.  Deneise Lever Diplomate, American Board of Sleep Medicine  ELECTRONICALLY SIGNED ON:  12/21/2014, 12:47 PM [place] PH: [phone]   FX: [phone] [place]

## 2014-12-21 NOTE — ED Notes (Signed)
Spoke Dr. Sharren Bridge. Informed him of CBG of 441. Gosroni orders to give patient 50 units of Novolog mix 70/30. No extra coverage at this time.

## 2014-12-21 NOTE — ED Provider Notes (Signed)
CSN: [id number]     Arrival date & time 12/21/14  1203 History   First MD Initiated Contact with Patient 12/21/14 1348     Chief Complaint  Patient presents with  . Shortness of Breath     (Consider location/radiation/quality/duration/timing/severity/associated sxs/prior Treatment) HPI   [name] is a 48 y.o. female, pt with history of Asthma, IDDM, and obesity, presents with shortness of breath since yesterday that came on while she was driving to her home. Accompanied by diarrhea with no hematochezia. Pt is not a smoker, has no history of cancer, no prolonged period of immobilization. Pt has tried albuterol nebulizer at home, but this did not provide relief. No cough, chest pain, fever/chills, N/V, rashes, or any other complaints.  Pt has not taken any of her medications today. Admitted to [facility] from 10/4-10/7, for shortness of breath and hyperglycemia. Pt wears home O2 at 2 lpm and her usual SpO2 on O2 is 96-97%.     Past Medical History  Diagnosis Date  . Asthma   . Hypertension   . Diabetes mellitus   . Hyperlipidemia   . History of echocardiogram [id number]    LVH, diastolic dysfunction   Past Surgical History  Procedure Laterality Date  . Tracheostomy      at age 58 from asthma attack  . Cataract extraction w/phaco Right 12/10/2012    Procedure: CATARACT EXTRACTION PHACO AND INTRAOCULAR LENS PLACEMENT (IOC);  Surgeon: Tonny Branch, MD;  Location: [facility];  Service: Ophthalmology;  Laterality: Right;  CDE:22..42  . Left heart catheterization with coronary angiogram N/A 04/09/2013    Procedure: LEFT HEART CATHETERIZATION WITH CORONARY ANGIOGRAM;  Surgeon: Laverda Page, MD;  Location: [facility] CATH LAB;  Service: Cardiovascular;  Laterality: N/A;  . Cardiac catheterization  03/2013    normal coronary arteries, EF 55%  . Eye surgery Left   . Cardiac catheterization  03/2013    normal coronary arteries   Family History  Problem Relation Age of Onset  . Diabetes  Mother    Social History  Substance Use Topics  . Smoking status: Never Smoker   . Smokeless tobacco: Never Used  . Alcohol Use: Yes     Comment: occasional   OB History    Gravida Para Term Preterm AB TAB SAB Ectopic Multiple Living            0     Review of Systems  Constitutional: Negative for fever, chills, diaphoresis and unexpected weight change.  Respiratory: Positive for shortness of breath and wheezing. Negative for cough, chest tightness and stridor.   Cardiovascular: Negative for chest pain, palpitations and leg swelling.  Gastrointestinal: Negative for nausea, vomiting, abdominal pain, diarrhea and constipation.  Genitourinary: Negative for dysuria and flank pain.  Musculoskeletal: Negative for back pain.  Skin: Negative for color change and pallor.  Neurological: Negative for dizziness, syncope, weakness and light-headedness.  All other systems reviewed and are negative.     Allergies  Review of patient's allergies indicates no known allergies.  Home Medications   Prior to Admission medications   Medication Sig Start Date End Date Taking? Authorizing Provider  albuterol (PROVENTIL HFA;VENTOLIN HFA) 108 (90 BASE) MCG/ACT inhaler Inhale 2 puffs into the lungs every 6 (six) hours as needed for wheezing.   Yes Historical Provider, MD  albuterol (PROVENTIL) (2.5 MG/3ML) 0.083% nebulizer solution Take 2.5 mg by nebulization every 4 (four) hours as needed for wheezing or shortness of breath.   Yes Historical Provider, MD  Alogliptin-Pioglitazone (OSENI) 25-15 MG TABS Take 1 tablet by mouth daily.   Yes Historical Provider, MD  aspirin EC 81 MG tablet Take 81 mg by mouth daily.    Yes Historical Provider, MD  atorvastatin (LIPITOR) 20 MG tablet Take 20 mg by mouth daily.    Yes Historical Provider, MD  carvedilol (COREG) 6.25 MG tablet Take 6.25 mg by mouth 2 (two) times daily.  11/08/13  Yes Historical Provider, MD  Fluticasone-Salmeterol (ADVAIR) 100-50 MCG/DOSE AEPB  Inhale 1 puff into the lungs every 12 (twelve) hours as needed (for shortness of breath).    Yes Historical Provider, MD  gabapentin (NEURONTIN) 300 MG capsule Take 300 mg by mouth 3 (three) times daily.    Yes Historical Provider, MD  Insulin Isophane & Regular Human (HUMULIN 70/30 KWIKPEN) (70-30) 100 UNIT/ML PEN Inject 60 Units into the skin 2 (two) times daily. 12/05/14  Yes Erline Hau, MD  montelukast (SINGULAIR) 10 MG tablet Take 10 mg by mouth daily.    Yes Historical Provider, MD  nitroGLYCERIN (NITROSTAT) 0.4 MG SL tablet Place 0.4 mg under the tongue every 5 (five) minutes as needed for chest pain.   Yes Historical Provider, MD  tetrahydrozoline 0.05 % ophthalmic solution Place 1 drop into both eyes daily as needed. Dry, Itchy Eyes   Yes Historical Provider, MD  torsemide (DEMADEX) 20 MG tablet Take 1 tablet (20 mg total) by mouth daily. 12/05/14  Yes Erline Hau, MD  albuterol (PROVENTIL) (2.5 MG/3ML) 0.083% nebulizer solution Take 3 mLs (2.5 mg total) by nebulization every 6 (six) hours as needed for wheezing or shortness of breath. 12/21/14   Acey Woodfield C Raveen Wieseler, PA-C  predniSONE (DELTASONE) 10 MG tablet Take 4 tablets (40 mg total) by mouth daily. 12/21/14   Cybele Maule C Chastity Noland, PA-C   BP 148/119 mmHg  Pulse 110  Temp(Src) 98.3 F (36.8 C) (Oral)  Resp 30  Ht 5' (1.524 m)  Wt 342 lb (155.13 kg)  BMI 66.79 kg/m2  SpO2 96%  LMP 07/29/2013 Physical Exam  Constitutional: She appears well-developed and well-nourished. No distress.  HENT:  Head: Normocephalic and atraumatic.  Eyes: Conjunctivae are normal. Pupils are equal, round, and reactive to light.  Cardiovascular: Normal rate, regular rhythm and normal heart sounds.   Pulmonary/Chest: No accessory muscle usage. Tachypnea noted. She has wheezes in the right middle field, the right lower field, the left middle field and the left lower field. She has no rhonchi. She has no rales.  Increased work of breath. Speaks in  full sentences. No accessory muscle usage. No orthopnea. Pt more comfortable lying on her back.   Abdominal: Soft. Bowel sounds are normal.  Musculoskeletal: She exhibits no edema or tenderness.  Neurological: She is alert.  Skin: Skin is warm and dry. She is not diaphoretic.  Nursing note and vitals reviewed.   ED Course  Procedures (including critical care time) Labs Review Labs Reviewed  BASIC METABOLIC PANEL - Abnormal; Notable for the following:    Glucose, Bld 390 (*)    BUN 43 (*)    Creatinine, Ser 1.57 (*)    GFR calc non Af Amer 38 (*)    GFR calc Af Amer 44 (*)    All other components within normal limits  CBC WITH DIFFERENTIAL/PLATELET - Abnormal; Notable for the following:    RBC 3.56 (*)    Hemoglobin 9.3 (*)    HCT 30.4 (*)    Platelets 126 (*)    All  other components within normal limits  BRAIN NATRIURETIC PEPTIDE - Abnormal; Notable for the following:    B Natriuretic Peptide 523.0 (*)    All other components within normal limits  CBC  CREATININE, SERUM  TSH  BRAIN NATRIURETIC PEPTIDE  CBC  COMPREHENSIVE METABOLIC PANEL  I-STAT TROPOININ, ED    Imaging Review Dg Chest 2 View  12/21/2014  CLINICAL DATA:  Shortness of breath and cough 2 days. EXAM: CHEST  2 VIEW COMPARISON:  12/02/2014 FINDINGS: Lungs are adequately inflated with new prominence of the perihilar markings bilaterally suggesting mild interstitial edema. No evidence of effusion. Borderline stable cardiomegaly. Remainder of the exam is unchanged. IMPRESSION: Findings suggesting mild interstitial edema and less likely infection. Borderline stable cardiomegaly. Electronically Signed   By: Marin Olp M.D.   On: 12/21/2014 13:37   I have personally reviewed and evaluated these images and lab results as part of my medical decision-making.   EKG Interpretation   Date/Time:  Sunday December 21 2014 14:05:00 EDT Ventricular Rate:  74 PR Interval:  153 QRS Duration: 82 QT Interval:  373 QTC  Calculation: 414 R Axis:   68 Text Interpretation:  Sinus rhythm Atrial premature complex Low voltage,  precordial leads since last tracing no significant change Confirmed by  MILLER  MD, BRIAN ([id number]) on 12/21/2014 2:51:15 PM      MDM   Final diagnoses:  Shortness of breath  Acute asthma exacerbation, unspecified asthma severity    Ajuni Pagani [place] presents with shortness of breath since yesterday.  Findings and plan of care discussed with Noemi Chapel, MD.  Pt has existing respiratory history and newly diagnosed CHF. Wells criteria puts pt at low risk for PE. HEART score shows low risk for MACE. CXR shows minor bilateral interstitial edema, but no consolidation. Likely an acute CHF vs COPD exacerbation. Pt has no orthopnea, no ankle edema, and speaks in full sentences. Pt states she hasn't taken her lasix for the past two days. Pt to receive lasix, finish breathing treatment, ambulate with SpO2, and discharged with instructions to follow up with PCP. Ambulated without tachycardia, but with drop in SpO2 to 87%.  Given 10mg  albuterol and 0.5mg  atrovent, both continuous over an hour. May have to admit pt due to acute asthma exacerbation with continuously dropping sats.   6:31 PM Pt reassessed. States she feels the same as she did when she came in. Slight wheezes in all fields. No obvious increased work of breathing.   6:51 PM Pt assessed. States she still feels the same. SpO2 96-97% while on continuous neb, but decreases to about 83% with even minor position change. Will put in hospitalist consult for admission due to acute asthma with inability to maintain O2 sats and order mag sulfate. Spoke with hospitalist, Dr. Anastasio Champion, who agreed to admit pt to med-surg bed. No additional instructions.     Filed Vitals:   12/21/14 1820 12/21/14 1830 12/21/14 1900 12/21/14 1959  BP: 147/65 148/119    Pulse: 86 87 107 110  Temp:      TempSrc:      Resp: 26 29 22 30   Height:      Weight:       SpO2: 100% 97% 96% 96%     Lorayne Bender, PA-C 12/21/14 2012  Noemi Chapel, MD 12/23/14 [phone]

## 2014-12-21 NOTE — ED Notes (Signed)
Patient put back on her oxygen 96 % on 2L.

## 2014-12-21 NOTE — ED Notes (Signed)
49 year old female, history of morbid obesity, recent diagnosis of congestive heart failure, states that she went for the last 2 days without her diuretic because she was knocked to be around the bathroom to urinate. She states that she has had more shortness of breath over the last 24 hours especially when she lays down, denies fevers or swelling of the legs more than usual. On exam she speaks in full sentences, she has no distress, no tachycardia, mild distant heart sounds, no rales, no hypoxia, has received an albuterol treatment which she states made her feel much better. I reviewed her chest x-ray, there is mild interstitial edema, Lasix given, stable for discharge, patient counseled on using medications appropriately.   EKG Interpretation  Date/Time:  Sunday December 21 2014 14:05:00 EDT Ventricular Rate:  74 PR Interval:  153 QRS Duration: 82 QT Interval:  373 QTC Calculation: 414 R Axis:   68 Text Interpretation:  Sinus rhythm Atrial premature complex Low voltage, precordial leads since last tracing no significant change Confirmed by Appollonia Klee  MD, Salley Boxley ([id number]) on 12/21/2014 2:51:15 PM        Medical screening examination/treatment/procedure(s) were conducted as a shared visit with non-physician practitioner(s) and myself.  I personally evaluated the patient during the encounter.  Clinical Impression:   Final diagnoses:  Shortness of breath  Acute asthma exacerbation, unspecified asthma severity         Noemi Chapel, MD 12/23/14 [phone]

## 2014-12-21 NOTE — ED Notes (Signed)
Patient complaining of shortness of breath since yesterday. States nebulizer treatments are not working at home.

## 2014-12-22 ENCOUNTER — Encounter (HOSPITAL_COMMUNITY): Payer: Self-pay | Admitting: *Deleted

## 2014-12-22 DIAGNOSIS — E119 Type 2 diabetes mellitus without complications: Secondary | ICD-10-CM | POA: Diagnosis not present

## 2014-12-22 DIAGNOSIS — N183 Chronic kidney disease, stage 3 (moderate): Secondary | ICD-10-CM

## 2014-12-22 DIAGNOSIS — E669 Obesity, unspecified: Secondary | ICD-10-CM

## 2014-12-22 DIAGNOSIS — R0602 Shortness of breath: Secondary | ICD-10-CM

## 2014-12-22 DIAGNOSIS — J9621 Acute and chronic respiratory failure with hypoxia: Secondary | ICD-10-CM

## 2014-12-22 DIAGNOSIS — I10 Essential (primary) hypertension: Secondary | ICD-10-CM

## 2014-12-22 DIAGNOSIS — J9620 Acute and chronic respiratory failure, unspecified whether with hypoxia or hypercapnia: Secondary | ICD-10-CM | POA: Diagnosis not present

## 2014-12-22 DIAGNOSIS — I5032 Chronic diastolic (congestive) heart failure: Secondary | ICD-10-CM

## 2014-12-22 DIAGNOSIS — J45901 Unspecified asthma with (acute) exacerbation: Secondary | ICD-10-CM

## 2014-12-22 HISTORY — DX: Shortness of breath: R06.02

## 2014-12-22 LAB — CBC
HCT: 31 % — ABNORMAL LOW (ref 36.0–46.0)
Hemoglobin: 9.8 g/dL — ABNORMAL LOW (ref 12.0–15.0)
MCH: 26.9 pg (ref 26.0–34.0)
MCHC: 31.6 g/dL (ref 30.0–36.0)
MCV: 85.2 fL (ref 78.0–100.0)
PLATELETS: 118 10*3/uL — AB (ref 150–400)
RBC: 3.64 MIL/uL — AB (ref 3.87–5.11)
RDW: 14.6 % (ref 11.5–15.5)
WBC: 8.9 10*3/uL (ref 4.0–10.5)

## 2014-12-22 LAB — COMPREHENSIVE METABOLIC PANEL
ALBUMIN: 3.4 g/dL — AB (ref 3.5–5.0)
ALT: 78 U/L — ABNORMAL HIGH (ref 14–54)
ANION GAP: 8 (ref 5–15)
AST: 89 U/L — ABNORMAL HIGH (ref 15–41)
Alkaline Phosphatase: 131 U/L — ABNORMAL HIGH (ref 38–126)
BUN: 50 mg/dL — ABNORMAL HIGH (ref 6–20)
CHLORIDE: 100 mmol/L — AB (ref 101–111)
CO2: 27 mmol/L (ref 22–32)
Calcium: 9.2 mg/dL (ref 8.9–10.3)
Creatinine, Ser: 1.84 mg/dL — ABNORMAL HIGH (ref 0.44–1.00)
GFR calc non Af Amer: 31 mL/min — ABNORMAL LOW (ref >60)
GFR, EST AFRICAN AMERICAN: 36 mL/min — AB (ref >60)
GLUCOSE: 412 mg/dL — AB (ref 65–99)
Potassium: 5.2 mmol/L — ABNORMAL HIGH (ref 3.5–5.1)
SODIUM: 135 mmol/L (ref 135–145)
Total Bilirubin: 1 mg/dL (ref 0.3–1.2)
Total Protein: 7.6 g/dL (ref 6.5–8.1)

## 2014-12-22 LAB — GLUCOSE, CAPILLARY
GLUCOSE-CAPILLARY: 438 mg/dL — AB (ref 65–99)
GLUCOSE-CAPILLARY: 283 mg/dL — AB (ref 65–99)
GLUCOSE-CAPILLARY: 243 mg/dL — AB (ref 65–99)
Glucose-Capillary: 517 mg/dL — ABNORMAL HIGH (ref 65–99)

## 2014-12-22 LAB — GLUCOSE, RANDOM: Glucose, Bld: 673 mg/dL (ref 65–99)

## 2014-12-22 LAB — BRAIN NATRIURETIC PEPTIDE: B NATRIURETIC PEPTIDE 5: 705 pg/mL — AB (ref 0.0–100.0)

## 2014-12-22 MED ORDER — PREDNISONE 10 MG PO TABS: ORAL_TABLET | ORAL | Status: DC

## 2014-12-22 MED ORDER — INSULIN ISOPHANE & REGULAR (HUMAN 70-30)100 UNIT/ML KWIKPEN: 60.0000 [IU] | PEN_INJECTOR | Freq: Two times a day (BID) | SUBCUTANEOUS | Status: DC

## 2014-12-22 MED ORDER — METHYLPREDNISOLONE SODIUM SUCC 125 MG IJ SOLR
60.0000 mg | Freq: Four times a day (QID) | INTRAMUSCULAR | Status: DC
  Administered 2014-12-22 (×2): 60 mg via INTRAVENOUS
  Filled 2014-12-22 (×2): qty 2

## 2014-12-22 MED ORDER — INSULIN ASPART 100 UNIT/ML ~~LOC~~ SOLN
10.0000 [IU] | Freq: Once | SUBCUTANEOUS | Status: AC
  Administered 2014-12-22: 10 [IU] via SUBCUTANEOUS

## 2014-12-22 MED ORDER — INSULIN ASPART PROT & ASPART (70-30 MIX) 100 UNIT/ML ~~LOC~~ SUSP
60.0000 [IU] | Freq: Two times a day (BID) | SUBCUTANEOUS | Status: DC
  Administered 2014-12-22: 60 [IU] via SUBCUTANEOUS
  Filled 2014-12-22: qty 10

## 2014-12-22 MED ORDER — NAPHAZOLINE-PHENIRAMINE 0.025-0.3 % OP SOLN
1.0000 [drp] | Freq: Four times a day (QID) | OPHTHALMIC | Status: DC | PRN
  Filled 2014-12-22: qty 5

## 2014-12-22 MED ORDER — INSULIN ASPART 100 UNIT/ML ~~LOC~~ SOLN
20.0000 [IU] | Freq: Once | SUBCUTANEOUS | Status: AC
  Administered 2014-12-22: 20 [IU] via SUBCUTANEOUS

## 2014-12-22 MED ORDER — INSULIN ISOPHANE & REGULAR (HUMAN 70-30)100 UNIT/ML KWIKPEN: 62.0000 [IU] | PEN_INJECTOR | Freq: Two times a day (BID) | SUBCUTANEOUS | Status: DC

## 2014-12-22 NOTE — Progress Notes (Signed)
CRITICAL VALUE ALERT  Critical value received:  Blood glucose 673  Date of notification:  12/22/14  Time of notification:  0114  Critical value read back:Yes.    Nurse who received alert:  Stann Mainland  MD notified (1st page):  Dr Darrick Meigs  Time of first page:  0115  MD notified (2nd page):  Time of second page:  Responding MD: Dr Darrick Meigs    Time MD responded: [phone]

## 2014-12-22 NOTE — Discharge Summary (Signed)
Physician Discharge Summary  [name] [id number] DOB: 12/19/1965 DOA: 12/21/2014  PCP: [name], MD  Admit date: 12/21/2014 Discharge date: 12/22/2014  Time spent: 40 minutes  Recommendations for Outpatient Follow-up:  1. Repeat BMET to follow electrolytes and renal function 2. Please reassess patient CBG's and keep close eye on her diabetes, patient will need further adjustment to her hypoglycemic regimen  3. Please arrange outpatient follow up with a pulmonologist for PFT's and further decisions on long term management of her diabetes  4. 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 No CP, no SOB and able to speak in full sentences at discharge. Advise to follow up with PCP in 10 days  Diet recommendation: low calorie, heart healthy and low carbohydrates diet   Filed Weights   12/21/14 1222 12/21/14 2150  Weight: 155.13 kg (342 lb) 161.526 kg (356 lb 1.6 oz)    History of present illness:  49 year old lady, morbidly obese, diabetic, hypertensive, previous history of congestive heart failure and asthma who now presents with a 24-hour history of dyspnea. She says that her oxygen, which she takes daily at 2 L/m, somehow ran out or was malfunctioning. She denies any cough or fever. Albuterol seems to have made her feel better in the emergency room. She denies any chest pain or limb weakness or limb pain. She is now being admitted for further management.  Hospital Course:  1-acute on chronic resp failure: due to asthma exacerbation and equipment malfunctioning from her oxygen tanks. 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 -improved with nebulizer therapy and solumedrol -on my exam good air movement, no wheezing and excellent saturation with 2L [place] -patient will be discharge with steroids tapering and instructions to use Oxygen supplementation and to follow with pulmonology service as an outpatient for PFT's and further adjustments to her long term maintenance therapy -will continue advair and singulair  -encourage to lose weight   2-chronic diastolic heart failure: patient without significant changes on her weight or signs of fluid overload. No crackles on exam. -will continue current dose of torsemide -advise to follow low sodium diet and continue tracking weight on daily basis -will follow up with PCP in 10 days  3-uncontrolled insulin dependent diabetes (type 2, with nephropathy and neuropathy): worsen by recurrent use of steroids -advise to follow low carbohydrates diet -will continue use of alogiptine-pioglitazone -continue 70/30, dose adjusted to 62 units BID -encourage to lose weight  4-CKD stage 3: -overall in stable condition base on her GFR -Cr 1.8 at discharge -advise to follow low sodium diet and maintain adequate hydration   5-morbid obesity: -Body mass index is 69.55 kg/(m^2). -discussed about low calorie diet, exercise and eventually bypass surgery; patient is motivated and will benefit of significant aggressive weight loss  6- diabetic neuropathy: continue Neurontin   7-HLD: will continue statins   Procedures:  See below for x-ray reports   Consultations:  None  Discharge Exam: Filed Vitals:   12/22/14 0610  BP: 155/56  Pulse: 54  Temp: 97.7 F (36.5 C)  Resp: 20    General: obese, in no distress, able to speak in full sentences and with good O2 sat on  her chronic 2L of oxygen suppleemntation Cardiovascular: S1 and S2, no rubs or gallops Respiratory: good air movement, no wheezing, no crackles, no use of accessory muscles  Abd: obese, NT, ND, positive  BS Extremities: no cyanosis trace to 1 + edema bilaterally (and unchanged according to patient reports)   Discharge Instructions   Discharge Instructions    Diet - low sodium heart healthy    Complete by:  As directed      Discharge instructions    Complete by:  As directed   Use your oxygen supplementation as instructed and make sure your tanks are filled and ready to use Follow low calorie diet (less than 2000 calories per day) Please follow low sodium diet (less than 2 gram daily) Please make sure you follow low carbohydrates diet and use your insulin as prescribed (do not skipped meals and do not missed your insulin shots) Continue exercises and weight loss program  Arrange follow up with PCP in 10 days You will need follow up with pulmonology service for PFT's and further management of long term treatment of your asthma Maintain adequate hydration Check your weight on daily basis          Current Discharge Medication List    START taking these medications   Details  !! albuterol (PROVENTIL) (2.5 MG/3ML) 0.083% nebulizer solution Take 3 mLs (2.5 mg total) by nebulization every 6 (six) hours as needed for wheezing or shortness of breath. Qty: 75 mL, Refills: 12     !! - Potential duplicate medications found. Please discuss with provider.    CONTINUE these medications which have CHANGED   Details  Insulin Isophane & Regular Human (HUMULIN 70/30 KWIKPEN) (70-30) 100 UNIT/ML PEN Inject 62 Units into the skin 2 (two) times daily. Qty: 15 mL, Refills: 2    predniSONE (DELTASONE) 10 MG tablet Take 4 tablets by mouth daily X 2 days; then 2 tablets by mouth daily X 3 days; then 1 tablet by mouth daily X 3 days and stop prednisone Qty: 18 tablet, Refills: 0      CONTINUE these medications which have NOT CHANGED   Details  albuterol (PROVENTIL HFA;VENTOLIN HFA) 108 (90 BASE) MCG/ACT inhaler Inhale 2 puffs into the lungs every 6 (six) hours as needed for wheezing.    !! albuterol  (PROVENTIL) (2.5 MG/3ML) 0.083% nebulizer solution Take 2.5 mg by nebulization every 4 (four) hours as needed for wheezing or shortness of breath.    Alogliptin-Pioglitazone (OSENI) 25-15 MG TABS Take 1 tablet by mouth daily.    aspirin EC 81 MG tablet Take 81 mg by mouth daily.     atorvastatin (LIPITOR) 20 MG tablet Take 20 mg by mouth daily.     carvedilol (COREG) 6.25 MG tablet Take 6.25 mg by mouth 2 (two) times daily.     Fluticasone-Salmeterol (ADVAIR) 100-50 MCG/DOSE AEPB Inhale 1 puff into the lungs every 12 (twelve) hours as needed (for shortness of breath).     gabapentin (NEURONTIN) 300 MG capsule Take 300 mg by mouth 3 (three) times daily.     montelukast (SINGULAIR) 10 MG tablet Take 10 mg by mouth daily.     nitroGLYCERIN (NITROSTAT) 0.4 MG SL tablet Place 0.4 mg under the tongue every 5 (five) minutes as needed for chest pain.    tetrahydrozoline 0.05 % ophthalmic solution Place 1 drop into both eyes daily as needed. Dry, Itchy Eyes    torsemide (DEMADEX) 20 MG tablet Take 1 tablet (20 mg total) by mouth  daily. Qty: 30 tablet, Refills: 1     !! - Potential duplicate medications found. Please discuss with provider.     No Known Allergies Follow-up Information    Follow up with HILL,GERALD K, MD. Schedule an appointment as soon as possible for a visit in 10 days.   Specialty:  Family Medicine   Contact information:   [address] [place] [address] [phone]       Follow up with [facility] [place].   Specialty:  Emergency Medicine   Why:  As needed, If symptoms worsen   Contact information:   [id number] [place] [phone]      The results of significant diagnostics from this hospitalization (including imaging, microbiology, ancillary and laboratory) are listed below for reference.    Significant Diagnostic Studies: Dg Chest 2 View  12/21/2014  CLINICAL DATA:  Shortness of breath  and cough 2 days. EXAM: CHEST  2 VIEW COMPARISON:  12/02/2014 FINDINGS: Lungs are adequately inflated with new prominence of the perihilar markings bilaterally suggesting mild interstitial edema. No evidence of effusion. Borderline stable cardiomegaly. Remainder of the exam is unchanged. IMPRESSION: Findings suggesting mild interstitial edema and less likely infection. Borderline stable cardiomegaly. Electronically Signed   By: Marin Olp M.D.   On: 12/21/2014 13:37   Dg Chest 2 View  12/02/2014  CLINICAL DATA:  Pt c/o of SOB since yesterday. Pt hx of asthma. Pt also states CBG this morning was 383. CBG in triage is 442. Pt states she took her insulin today. EXAM: CHEST  2 VIEW COMPARISON:  05/07/2014 FINDINGS: Heart size is normal. There is minimal patchy density in the lingula, possibly infectious. There is no pulmonary edema. No pleural effusions. IMPRESSION: Atelectasis or early infiltrate in the lingula. Electronically Signed   By: Nolon Nations M.D.   On: 12/02/2014 16:19   US Renal  12/03/2014  CLINICAL DATA:  Acute tubular necrosis EXAM: RENAL / URINARY TRACT ULTRASOUND COMPLETE COMPARISON:  None. FINDINGS: Right Kidney: Length: 11.6 cm. Echogenicity within normal limits. No mass or hydronephrosis visualized. Left Kidney: Length: 10.7 cm. Echogenicity within normal limits. No mass or hydronephrosis visualized. Bladder: Predominantly decompressed IMPRESSION: No acute abnormality noted. Electronically Signed   By: Inez Catalina M.D.   On: 12/03/2014 16:00   Labs: Basic Metabolic Panel:  Recent Labs Lab 12/21/14 1415 12/22/14 0030 12/22/14 0529  NA 137  --  135  K 4.5  --  5.2*  CL 101  --  100*  CO2 26  --  27  GLUCOSE 390* 673* 412*  BUN 43*  --  50*  CREATININE 1.57*  --  1.84*  CALCIUM 9.1  --  9.2   Liver Function Tests:  Recent Labs Lab 12/22/14 0529  AST 89*  ALT 78*  ALKPHOS 131*  BILITOT 1.0  PROT 7.6  ALBUMIN 3.4*   CBC:  Recent Labs Lab 12/21/14 1415  12/22/14 0529  WBC 9.9 8.9  NEUTROABS 7.7  --   HGB 9.3* 9.8*  HCT 30.4* 31.0*  MCV 85.4 85.2  PLT 126* 118*   BNP (last 3 results)  Recent Labs  05/03/14 1406 12/21/14 1415 12/22/14 0529  BNP 122.0* 523.0* 705.0*    CBG:  Recent Labs Lab 12/21/14 2011 12/21/14 2309 12/22/14 0230 12/22/14 0404 12/22/14 0721  GLUCAP 441* >600* 517* 438* 283*    Signed:  Barton Dubois  [facility] 12/22/2014, 10:20 AM

## 2014-12-22 NOTE — Progress Notes (Addendum)
Inpatient Diabetes Program Recommendations  AACE/ADA: New Consensus Statement on Inpatient Glycemic Control (2015)  Target Ranges:  Prepandial:   less than 140 mg/dL      Peak postprandial:   less than 180 mg/dL (1-2 hours)      Critically ill patients:  140 - 180 mg/dL    Results for [name], [name] (MRN [id number]) as of 12/22/2014 11:40  Ref. Range 12/21/2014 20:11 12/21/2014 23:09 12/22/2014 02:30 12/22/2014 04:04 12/22/2014 07:21 12/22/2014 11:19  Glucose-Capillary Latest Ref Range: 65-99 mg/dL 441 (H) >600 (HH) 517 (H) 438 (H) 283 (H) 243 (H)     Admit with: Dyspnea  History: DM, HTN, CKD  Home DM Meds: Humulin 70/30 insulin- 60 units bidwc (increased to this dose on 10/07 at time of d/c from hospital by Dr. Jerilee Hoh)       Melynda Ripple (alogliptin/ pioglitazone) combo drug- 25/15 mg once daily  Current Insulin Orders: 70/30 insulin- 60 units bidwc      Novolog Resistant SSI (0-20 units) TID AC + HS      Oseni 25/15 mg once daily     -Note patient was admitted to [facility] on 12/02/14 with SOB.  A1c at that admission was 14.8%.  Patient was seen by DM Coordinator during that admission and was counseled at great length about the importance of good glucose control at home (see DM Coordinator note from Barnie Alderman, RN from 12/04/14).  -Patient was given information on seeking care with Dr. Dorris Fetch with [name] at that admission.  Not sure if patient has made an appointment yet with Dr. Dorris Fetch as instructed.  -Note 70/30 insulin was started this AM with breakfast.  CBG down to 283 mg/dl by 9am.  CBG 243 mg/dl at 12pm.  Patient received several doses of Novolog since midnight to try to bring CBGs down.     --Will follow patient during hospitalization--  Wyn Quaker RN, MSN, CDE Diabetes Coordinator Inpatient Glycemic Control Team Team Pager: [phone] (8a-5p)

## 2014-12-22 NOTE — Progress Notes (Signed)
Gave patient 20u of Novolog per MD order, patients CBG came down from 673 to 517. Paged on-call MD, followed orders to retake CBG in one hour. Patients CBG was 438, paging the on-call MD to make him aware, will follow any new orders received.

## 2014-12-22 NOTE — Care Management Note (Signed)
Case Management Note  Patient Details  Name: VENUS GREGOREK MRN: [id number] Date of Birth: Aug 04, 1965  Subjective/Objective:                  Admitted with asthma exacerbation. Pt is from home, lives with mother and has aid 6 days a week. Pt has home O2 and neb machine from [facility]. Pt uses cane/walker for PRN use.  Action/Plan: Pt plans to return home with self care. No CM needs noted. Discharging today.   Expected Discharge Date:  12/23/14               Expected Discharge Plan:  Home/Self Care  In-House Referral:  NA  Discharge planning Services  CM Consult  Post Acute Care Choice:  NA Choice offered to:  NA  DME Arranged:    DME Agency:     HH Arranged:    HH Agency:     Status of Service:  Completed, signed off  Medicare Important Message Given:    Date Medicare IM Given:    Medicare IM give by:    Date Additional Medicare IM Given:    Additional Medicare Important Message give by:     If discussed at [place] of Stay Meetings, dates discussed:    Additional Comments:  Sherald Barge, RN 12/22/2014, 11:17 AM

## 2014-12-22 NOTE — Progress Notes (Signed)
Discharge instructions and prescriptions given, verbalized understanding, out in stable condition via w/c with staff. 

## 2014-12-26 DIAGNOSIS — E103593 Type 1 diabetes mellitus with proliferative diabetic retinopathy without macular edema, bilateral: Secondary | ICD-10-CM | POA: Diagnosis not present

## 2014-12-26 DIAGNOSIS — H4313 Vitreous hemorrhage, bilateral: Secondary | ICD-10-CM | POA: Diagnosis not present

## 2014-12-31 DIAGNOSIS — Z6841 Body Mass Index (BMI) 40.0 and over, adult: Secondary | ICD-10-CM | POA: Diagnosis not present

## 2014-12-31 DIAGNOSIS — J45909 Unspecified asthma, uncomplicated: Secondary | ICD-10-CM | POA: Diagnosis not present

## 2014-12-31 DIAGNOSIS — I10 Essential (primary) hypertension: Secondary | ICD-10-CM | POA: Diagnosis not present

## 2014-12-31 DIAGNOSIS — E1165 Type 2 diabetes mellitus with hyperglycemia: Secondary | ICD-10-CM | POA: Diagnosis not present

## 2015-01-01 DIAGNOSIS — J9601 Acute respiratory failure with hypoxia: Secondary | ICD-10-CM | POA: Diagnosis not present

## 2015-01-01 DIAGNOSIS — J45901 Unspecified asthma with (acute) exacerbation: Secondary | ICD-10-CM | POA: Diagnosis not present

## 2015-01-11 DIAGNOSIS — J9601 Acute respiratory failure with hypoxia: Secondary | ICD-10-CM | POA: Diagnosis not present

## 2015-01-26 DIAGNOSIS — N183 Chronic kidney disease, stage 3 (moderate): Secondary | ICD-10-CM | POA: Diagnosis not present

## 2015-01-26 DIAGNOSIS — Z79899 Other long term (current) drug therapy: Secondary | ICD-10-CM | POA: Diagnosis not present

## 2015-01-26 DIAGNOSIS — E875 Hyperkalemia: Secondary | ICD-10-CM | POA: Diagnosis not present

## 2015-01-28 DIAGNOSIS — N183 Chronic kidney disease, stage 3 (moderate): Secondary | ICD-10-CM | POA: Diagnosis not present

## 2015-01-28 DIAGNOSIS — E871 Hypo-osmolality and hyponatremia: Secondary | ICD-10-CM | POA: Diagnosis not present

## 2015-01-28 DIAGNOSIS — E875 Hyperkalemia: Secondary | ICD-10-CM | POA: Diagnosis not present

## 2015-01-28 DIAGNOSIS — N179 Acute kidney failure, unspecified: Secondary | ICD-10-CM | POA: Diagnosis not present

## 2015-01-29 DIAGNOSIS — L609 Nail disorder, unspecified: Secondary | ICD-10-CM | POA: Diagnosis not present

## 2015-01-29 DIAGNOSIS — E1140 Type 2 diabetes mellitus with diabetic neuropathy, unspecified: Secondary | ICD-10-CM | POA: Diagnosis not present

## 2015-01-29 DIAGNOSIS — L110 Acquired keratosis follicularis: Secondary | ICD-10-CM | POA: Diagnosis not present

## 2015-02-10 DIAGNOSIS — J9601 Acute respiratory failure with hypoxia: Secondary | ICD-10-CM | POA: Diagnosis not present

## 2015-02-26 DIAGNOSIS — D509 Iron deficiency anemia, unspecified: Secondary | ICD-10-CM | POA: Diagnosis not present

## 2015-02-26 DIAGNOSIS — R809 Proteinuria, unspecified: Secondary | ICD-10-CM | POA: Diagnosis not present

## 2015-02-26 DIAGNOSIS — I10 Essential (primary) hypertension: Secondary | ICD-10-CM | POA: Diagnosis not present

## 2015-02-26 DIAGNOSIS — N183 Chronic kidney disease, stage 3 (moderate): Secondary | ICD-10-CM | POA: Diagnosis not present

## 2015-02-26 DIAGNOSIS — D519 Vitamin B12 deficiency anemia, unspecified: Secondary | ICD-10-CM | POA: Diagnosis not present

## 2015-02-26 DIAGNOSIS — E559 Vitamin D deficiency, unspecified: Secondary | ICD-10-CM | POA: Diagnosis not present

## 2015-03-04 DIAGNOSIS — N179 Acute kidney failure, unspecified: Secondary | ICD-10-CM | POA: Diagnosis not present

## 2015-03-04 DIAGNOSIS — D649 Anemia, unspecified: Secondary | ICD-10-CM | POA: Diagnosis not present

## 2015-03-04 DIAGNOSIS — N183 Chronic kidney disease, stage 3 (moderate): Secondary | ICD-10-CM | POA: Diagnosis not present

## 2015-03-04 DIAGNOSIS — E871 Hypo-osmolality and hyponatremia: Secondary | ICD-10-CM | POA: Diagnosis not present

## 2015-03-04 DIAGNOSIS — R809 Proteinuria, unspecified: Secondary | ICD-10-CM | POA: Diagnosis not present

## 2015-03-10 DIAGNOSIS — E1122 Type 2 diabetes mellitus with diabetic chronic kidney disease: Secondary | ICD-10-CM | POA: Diagnosis not present

## 2015-03-10 DIAGNOSIS — E1165 Type 2 diabetes mellitus with hyperglycemia: Secondary | ICD-10-CM | POA: Diagnosis not present

## 2015-03-10 DIAGNOSIS — Z0000 Encounter for general adult medical examination without abnormal findings: Secondary | ICD-10-CM | POA: Diagnosis not present

## 2015-03-10 DIAGNOSIS — I10 Essential (primary) hypertension: Secondary | ICD-10-CM | POA: Diagnosis not present

## 2015-03-13 ENCOUNTER — Ambulatory Visit (HOSPITAL_COMMUNITY)
Admission: RE | Admit: 2015-03-13 | Discharge: 2015-03-13 | Disposition: A | Discharge status: Home / Self Care | Payer: Medicare Other | Source: Ambulatory Visit | Attending: Family Medicine | Admitting: Family Medicine

## 2015-03-13 ENCOUNTER — Other Ambulatory Visit (HOSPITAL_COMMUNITY): Payer: Self-pay | Admitting: Family Medicine

## 2015-03-13 DIAGNOSIS — M79662 Pain in left lower leg: Secondary | ICD-10-CM | POA: Insufficient documentation

## 2015-03-13 DIAGNOSIS — M7989 Other specified soft tissue disorders: Secondary | ICD-10-CM | POA: Insufficient documentation

## 2015-03-13 DIAGNOSIS — J45901 Unspecified asthma with (acute) exacerbation: Secondary | ICD-10-CM | POA: Diagnosis not present

## 2015-03-13 DIAGNOSIS — J9601 Acute respiratory failure with hypoxia: Secondary | ICD-10-CM | POA: Diagnosis not present

## 2015-03-13 DIAGNOSIS — R609 Edema, unspecified: Secondary | ICD-10-CM

## 2015-03-24 ENCOUNTER — Emergency Department (HOSPITAL_COMMUNITY)
Admission: EM | Admit: 2015-03-24 | Discharge: 2015-03-24 | Disposition: A | Discharge status: Home / Self Care | Payer: Medicare Other | Attending: Emergency Medicine | Admitting: Emergency Medicine

## 2015-03-24 ENCOUNTER — Emergency Department (HOSPITAL_COMMUNITY): Payer: Medicare Other

## 2015-03-24 ENCOUNTER — Encounter (HOSPITAL_COMMUNITY): Payer: Self-pay | Admitting: *Deleted

## 2015-03-24 DIAGNOSIS — R0602 Shortness of breath: Secondary | ICD-10-CM | POA: Diagnosis not present

## 2015-03-24 DIAGNOSIS — I10 Essential (primary) hypertension: Secondary | ICD-10-CM | POA: Insufficient documentation

## 2015-03-24 DIAGNOSIS — Z794 Long term (current) use of insulin: Secondary | ICD-10-CM | POA: Diagnosis not present

## 2015-03-24 DIAGNOSIS — R2243 Localized swelling, mass and lump, lower limb, bilateral: Secondary | ICD-10-CM | POA: Insufficient documentation

## 2015-03-24 DIAGNOSIS — E785 Hyperlipidemia, unspecified: Secondary | ICD-10-CM | POA: Diagnosis not present

## 2015-03-24 DIAGNOSIS — J45901 Unspecified asthma with (acute) exacerbation: Secondary | ICD-10-CM | POA: Insufficient documentation

## 2015-03-24 DIAGNOSIS — I509 Heart failure, unspecified: Secondary | ICD-10-CM | POA: Insufficient documentation

## 2015-03-24 DIAGNOSIS — R079 Chest pain, unspecified: Secondary | ICD-10-CM | POA: Diagnosis not present

## 2015-03-24 DIAGNOSIS — Z7984 Long term (current) use of oral hypoglycemic drugs: Secondary | ICD-10-CM | POA: Diagnosis not present

## 2015-03-24 DIAGNOSIS — Z79899 Other long term (current) drug therapy: Secondary | ICD-10-CM | POA: Diagnosis not present

## 2015-03-24 DIAGNOSIS — Z9889 Other specified postprocedural states: Secondary | ICD-10-CM | POA: Diagnosis not present

## 2015-03-24 DIAGNOSIS — Z7982 Long term (current) use of aspirin: Secondary | ICD-10-CM | POA: Diagnosis not present

## 2015-03-24 DIAGNOSIS — R0789 Other chest pain: Secondary | ICD-10-CM | POA: Diagnosis not present

## 2015-03-24 DIAGNOSIS — E119 Type 2 diabetes mellitus without complications: Secondary | ICD-10-CM | POA: Diagnosis not present

## 2015-03-24 HISTORY — DX: Heart failure, unspecified: I50.9

## 2015-03-24 LAB — CBC WITH DIFFERENTIAL/PLATELET
Basophils Absolute: 0 10*3/uL (ref 0.0–0.1)
Basophils Relative: 0 %
EOS PCT: 3 %
Eosinophils Absolute: 0.2 10*3/uL (ref 0.0–0.7)
HCT: 34.3 % — ABNORMAL LOW (ref 36.0–46.0)
Hemoglobin: 11.1 g/dL — ABNORMAL LOW (ref 12.0–15.0)
LYMPHS ABS: 3 10*3/uL (ref 0.7–4.0)
LYMPHS PCT: 37 %
MCH: 27.3 pg (ref 26.0–34.0)
MCHC: 32.4 g/dL (ref 30.0–36.0)
MCV: 84.3 fL (ref 78.0–100.0)
Monocytes Absolute: 0.5 10*3/uL (ref 0.1–1.0)
Monocytes Relative: 7 %
Neutro Abs: 4.2 10*3/uL (ref 1.7–7.7)
Neutrophils Relative %: 53 %
PLATELETS: 153 10*3/uL (ref 150–400)
RBC: 4.07 MIL/uL (ref 3.87–5.11)
RDW: 13.2 % (ref 11.5–15.5)
WBC: 7.9 10*3/uL (ref 4.0–10.5)

## 2015-03-24 LAB — COMPREHENSIVE METABOLIC PANEL
ALK PHOS: 80 U/L (ref 38–126)
ALT: 15 U/L (ref 14–54)
AST: 21 U/L (ref 15–41)
Albumin: 3.4 g/dL — ABNORMAL LOW (ref 3.5–5.0)
Anion gap: 8 (ref 5–15)
BUN: 39 mg/dL — ABNORMAL HIGH (ref 6–20)
CALCIUM: 9.1 mg/dL (ref 8.9–10.3)
CHLORIDE: 102 mmol/L (ref 101–111)
CO2: 29 mmol/L (ref 22–32)
CREATININE: 1.59 mg/dL — AB (ref 0.44–1.00)
GFR, EST AFRICAN AMERICAN: 43 mL/min — AB (ref >60)
GFR, EST NON AFRICAN AMERICAN: 37 mL/min — AB (ref >60)
Glucose, Bld: 180 mg/dL — ABNORMAL HIGH (ref 65–99)
Potassium: 4.3 mmol/L (ref 3.5–5.1)
Sodium: 139 mmol/L (ref 135–145)
TOTAL PROTEIN: 6.8 g/dL (ref 6.5–8.1)
Total Bilirubin: 0.4 mg/dL (ref 0.3–1.2)

## 2015-03-24 LAB — TROPONIN I: Troponin I: <0.03 ng/mL (ref <0.031)

## 2015-03-24 LAB — I-STAT TROPONIN, ED: Troponin i, poc: 0 ng/mL (ref 0.00–0.08)

## 2015-03-24 LAB — D-DIMER, QUANTITATIVE (NOT AT ARMC): D DIMER QUANT: 0.84 ug{FEU}/mL — AB (ref 0.00–0.50)

## 2015-03-24 MED ORDER — IOHEXOL 350 MG/ML SOLN
100.0000 mL | Freq: Once | INTRAVENOUS | Status: AC | PRN
  Administered 2015-03-24: 100 mL via INTRAVENOUS

## 2015-03-24 MED ORDER — PANTOPRAZOLE SODIUM 20 MG PO TBEC: 20.0000 mg | DELAYED_RELEASE_TABLET | Freq: Every day | ORAL | Status: DC

## 2015-03-24 MED ORDER — TRAMADOL HCL 50 MG PO TABS: 50.0000 mg | ORAL_TABLET | Freq: Four times a day (QID) | ORAL | Status: DC | PRN

## 2015-03-24 NOTE — ED Notes (Signed)
MD at bedside. 

## 2015-03-24 NOTE — ED Notes (Signed)
Had to reset monitor in order to get a more accurate EKG

## 2015-03-24 NOTE — ED Provider Notes (Signed)
CSN: [id number]     Arrival date & time 03/24/15  1008 History  By signing my name below, I, Terrance Branch, attest that this documentation has been prepared under the direction and in the presence of Milton Ferguson, MD. Electronically Signed: Randa Evens, ED Scribe. 03/24/2015. 10:28 AM.     Chief Complaint  Patient presents with  . Chest Pain   Patient is a 50 y.o. female presenting with chest pain. The history is provided by the patient (Patient complains of left-sided chest pain for couple days now). No language interpreter was used.  Chest Pain Pain location:  L chest Pain quality: aching   Pain radiates to:  Does not radiate Pain radiates to the back: no   Pain severity:  Mild Onset quality:  Sudden Timing:  Intermittent Chronicity:  New Context: breathing   Associated symptoms: shortness of breath   Associated symptoms: no abdominal pain, no back pain, no cough, no fatigue and no headache    HPI Comments: [name] is a 50 y.o. female who presents to the Emergency Department complaining of constant left sided chest pain onset this morning at 8:30 AM. Pt reports associated SOB Pt doesn't report radiation of pain. She states that the pain is worse when taking a deep breath. Pt doesn't report any medications PTA. Denies diaphoresis, leg pain or other related symptoms.    Past Medical History  Diagnosis Date  . Asthma   . Hypertension   . Diabetes mellitus   . Hyperlipidemia   . History of echocardiogram [id number]    LVH, diastolic dysfunction  . CHF (congestive heart failure) [facility])    Past Surgical History  Procedure Laterality Date  . Tracheostomy      at age 64 from asthma attack  . Cataract extraction w/phaco Right 12/10/2012    Procedure: CATARACT EXTRACTION PHACO AND INTRAOCULAR LENS PLACEMENT (IOC);  Surgeon: Tonny Branch, MD;  Location: [facility];  Service: Ophthalmology;  Laterality: Right;  CDE:22..42  . Left heart catheterization with coronary angiogram  N/A 04/09/2013    Procedure: LEFT HEART CATHETERIZATION WITH CORONARY ANGIOGRAM;  Surgeon: Laverda Page, MD;  Location: [facility] CATH LAB;  Service: Cardiovascular;  Laterality: N/A;  . Cardiac catheterization  03/2013    normal coronary arteries, EF 55%  . Eye surgery Left   . Cardiac catheterization  03/2013    normal coronary arteries   Family History  Problem Relation Age of Onset  . Diabetes Mother    Social History  Substance Use Topics  . Smoking status: Never Smoker   . Smokeless tobacco: Never Used  . Alcohol Use: Yes     Comment: occasional   OB History    Gravida Para Term Preterm AB TAB SAB Ectopic Multiple Living            0      Review of Systems  Constitutional: Negative for appetite change and fatigue.  HENT: Negative for congestion, ear discharge and sinus pressure.   Eyes: Negative for discharge.  Respiratory: Positive for shortness of breath. Negative for cough.   Cardiovascular: Positive for chest pain and leg swelling.  Gastrointestinal: Negative for abdominal pain and diarrhea.  Genitourinary: Negative for frequency and hematuria.  Musculoskeletal: Negative for back pain.  Skin: Negative for rash.  Neurological: Negative for seizures and headaches.  Psychiatric/Behavioral: Negative for hallucinations.      Allergies  Review of patient's allergies indicates no known allergies.  Home Medications   Prior to Admission medications  Medication Sig Start Date End Date Taking? Authorizing Provider  albuterol (PROVENTIL HFA;VENTOLIN HFA) 108 (90 BASE) MCG/ACT inhaler Inhale 2 puffs into the lungs every 6 (six) hours as needed for wheezing.    Historical Provider, MD  albuterol (PROVENTIL) (2.5 MG/3ML) 0.083% nebulizer solution Take 3 mLs (2.5 mg total) by nebulization every 6 (six) hours as needed for wheezing or shortness of breath. 12/21/14   Shawn C Joy, PA-C  Alogliptin-Pioglitazone (OSENI) 25-15 MG TABS Take 1 tablet by mouth daily.    Historical  Provider, MD  aspirin EC 81 MG tablet Take 81 mg by mouth daily.     Historical Provider, MD  atorvastatin (LIPITOR) 20 MG tablet Take 20 mg by mouth daily.     Historical Provider, MD  carvedilol (COREG) 6.25 MG tablet Take 6.25 mg by mouth 2 (two) times daily.  11/08/13   Historical Provider, MD  Fluticasone-Salmeterol (ADVAIR) 100-50 MCG/DOSE AEPB Inhale 1 puff into the lungs every 12 (twelve) hours as needed (for shortness of breath).     Historical Provider, MD  gabapentin (NEURONTIN) 300 MG capsule Take 300 mg by mouth 3 (three) times daily.     Historical Provider, MD  Insulin Isophane & Regular Human (HUMULIN 70/30 KWIKPEN) (70-30) 100 UNIT/ML PEN Inject 62 Units into the skin 2 (two) times daily. 12/22/14   Barton Dubois, MD  montelukast (SINGULAIR) 10 MG tablet Take 10 mg by mouth daily.     Historical Provider, MD  nitroGLYCERIN (NITROSTAT) 0.4 MG SL tablet Place 0.4 mg under the tongue every 5 (five) minutes as needed for chest pain.    Historical Provider, MD  predniSONE (DELTASONE) 10 MG tablet Take 4 tablets by mouth daily X 2 days; then 2 tablets by mouth daily X 3 days; then 1 tablet by mouth daily X 3 days and stop prednisone 12/22/14   Barton Dubois, MD  tetrahydrozoline 0.05 % ophthalmic solution Place 1 drop into both eyes daily as needed. Dry, Itchy Eyes    Historical Provider, MD  torsemide (DEMADEX) 20 MG tablet Take 1 tablet (20 mg total) by mouth daily. 12/05/14   Erline Hau, MD   BP 150/69 mmHg  Pulse 71  Temp(Src) 98.3 F (36.8 C) (Oral)  Resp 18  Ht 5' 0.5" (1.537 m)  Wt 335 lb (151.955 kg)  BMI 64.32 kg/m2  SpO2 100%  LMP 07/29/2013   Physical Exam  Constitutional: She is oriented to person, place, and time. She appears well-developed.  HENT:  Head: Normocephalic.  Eyes: Conjunctivae and EOM are normal. No scleral icterus.  Neck: Neck supple. No thyromegaly present.  Cardiovascular: Normal rate and regular rhythm.  Exam reveals no gallop and no  friction rub.   No murmur heard. Pulmonary/Chest: No stridor. She has no wheezes. She has no rales. She exhibits no tenderness.  Abdominal: She exhibits no distension. There is no tenderness. There is no rebound.  Musculoskeletal: Normal range of motion. She exhibits edema.  3+ edema bilateral lower legs.   Lymphadenopathy:    She has no cervical adenopathy.  Neurological: She is oriented to person, place, and time. She exhibits normal muscle tone. Coordination normal.  Skin: No rash noted. No erythema.  Psychiatric: She has a normal mood and affect. Her behavior is normal.  Nursing note and vitals reviewed.   ED Course  Procedures (including critical care time) DIAGNOSTIC STUDIES: Oxygen Saturation is 100% on RA, normal by my interpretation.    COORDINATION OF CARE: 10:27 AM-Discussed treatment plan  with pt at bedside and pt agreed to plan.    Labs Review Labs Reviewed  D-DIMER, QUANTITATIVE (NOT AT [facility])  CBC WITH DIFFERENTIAL/PLATELET  COMPREHENSIVE METABOLIC PANEL  I-STAT TROPOININ, ED    Imaging Review No results found.    EKG Interpretation   Date/Time:  Tuesday March 24 2015 10:25:11 EST Ventricular Rate:  73 PR Interval:  167 QRS Duration: 97 QT Interval:  406 QTC Calculation: 447 R Axis:   77 Text Interpretation:  Sinus rhythm Low voltage, precordial leads Confirmed  by Desi Carby  MD, Zarrah Loveland [phone]) on 03/24/2015 1:33:45 PM      MDM   Final diagnoses:  None      Patient with chest pain last couple days somewhat pleuritic. 2 troponins normal history of cardiac cath with clean coronaries last year. CT chest negative. Doubt chest pain is related to  CAD.   Will put patient on protonic and pain medicine and have her follow-up with her PCP     Milton Ferguson, MD 03/24/15 1511

## 2015-03-24 NOTE — Discharge Instructions (Signed)
Follow up with your md next week. °

## 2015-03-24 NOTE — ED Notes (Addendum)
Pt c/o left sided chest pain that started this morning. Denies radiation. Pain is sharp and constant.

## 2015-03-25 DIAGNOSIS — H3582 Retinal ischemia: Secondary | ICD-10-CM | POA: Diagnosis not present

## 2015-03-25 DIAGNOSIS — H4312 Vitreous hemorrhage, left eye: Secondary | ICD-10-CM | POA: Diagnosis not present

## 2015-03-25 DIAGNOSIS — E103593 Type 1 diabetes mellitus with proliferative diabetic retinopathy without macular edema, bilateral: Secondary | ICD-10-CM | POA: Diagnosis not present

## 2015-03-31 DIAGNOSIS — R079 Chest pain, unspecified: Secondary | ICD-10-CM | POA: Diagnosis not present

## 2015-03-31 DIAGNOSIS — E1122 Type 2 diabetes mellitus with diabetic chronic kidney disease: Secondary | ICD-10-CM | POA: Diagnosis not present

## 2015-03-31 DIAGNOSIS — N183 Chronic kidney disease, stage 3 (moderate): Secondary | ICD-10-CM | POA: Diagnosis not present

## 2015-04-09 DIAGNOSIS — L609 Nail disorder, unspecified: Secondary | ICD-10-CM | POA: Diagnosis not present

## 2015-04-09 DIAGNOSIS — L110 Acquired keratosis follicularis: Secondary | ICD-10-CM | POA: Diagnosis not present

## 2015-04-09 DIAGNOSIS — E1140 Type 2 diabetes mellitus with diabetic neuropathy, unspecified: Secondary | ICD-10-CM | POA: Diagnosis not present

## 2015-04-13 DIAGNOSIS — J9601 Acute respiratory failure with hypoxia: Secondary | ICD-10-CM | POA: Diagnosis not present

## 2015-04-27 DIAGNOSIS — Z961 Presence of intraocular lens: Secondary | ICD-10-CM | POA: Diagnosis not present

## 2015-04-27 DIAGNOSIS — H25812 Combined forms of age-related cataract, left eye: Secondary | ICD-10-CM | POA: Diagnosis not present

## 2015-04-27 DIAGNOSIS — E113593 Type 2 diabetes mellitus with proliferative diabetic retinopathy without macular edema, bilateral: Secondary | ICD-10-CM | POA: Diagnosis not present

## 2015-05-04 ENCOUNTER — Other Ambulatory Visit: Payer: Self-pay | Admitting: *Deleted

## 2015-05-04 NOTE — Patient Outreach (Signed)
[facility]) Care Management  05/04/2015  LISSETT FOUGHT [birth year] [id number]   Self referral per patient: Telephone call to patient; person who answered call stated patient was not home; states she will advise patient to return call.  Plan: will follow up. Screening call placed on appointment schedule.  Sherrin Daisy, RN BSN Bud Management Coordinator [facility] Care Management  [phone]

## 2015-05-05 ENCOUNTER — Ambulatory Visit: Payer: Self-pay | Admitting: *Deleted

## 2015-05-05 DIAGNOSIS — E1022 Type 1 diabetes mellitus with diabetic chronic kidney disease: Secondary | ICD-10-CM | POA: Diagnosis not present

## 2015-05-05 DIAGNOSIS — E785 Hyperlipidemia, unspecified: Secondary | ICD-10-CM | POA: Diagnosis not present

## 2015-05-05 DIAGNOSIS — E10649 Type 1 diabetes mellitus with hypoglycemia without coma: Secondary | ICD-10-CM | POA: Diagnosis not present

## 2015-05-05 NOTE — Patient Instructions (Signed)
[name]  05/05/2015     @PREFPERIOPPHARMACY @   Your procedure is scheduled on 05/11/2015.  Report to [facility] at 7:00 A.M.  Call this number if you have problems the morning of surgery:  [phone]   Remember:  Do not eat food or drink liquids after midnight.  Take these medicines the morning of surgery with A SIP OF WATER Albuterol inhaler (bring with you to hospital, as well, Coreg, Advair, Gabapentin, Singulair  Protonix, Ultram if needed   DO NOT TAKE MEDICATION FOR DIABETES MORNING OF SURGERY  TAKE ONLY 1/2 DOSE OF EVENING INSULIN NIGHT PRIOR TO SURGERY   Do not wear jewelry, make-up or nail polish.  Do not wear lotions, powders, or perfumes.  You may wear deodorant.  Do not shave 48 hours prior to surgery.  Men may shave face and neck.  Do not bring valuables to the hospital.  [facility] is not responsible for any belongings or valuables.  Contacts, dentures or bridgework may not be worn into surgery.  Leave your suitcase in the car.  After surgery it may be brought to your room.  For patients admitted to the hospital, discharge time will be determined by your treatment team.  Patients discharged the day of surgery will not be allowed to drive home.    Please read over the following fact sheets that you were given. Anesthesia Post-op Instructions     PATIENT INSTRUCTIONS POST-ANESTHESIA  IMMEDIATELY FOLLOWING SURGERY:  Do not drive or operate machinery for the first twenty four hours after surgery.  Do not make any important decisions for twenty four hours after surgery or while taking narcotic pain medications or sedatives.  If you develop intractable nausea and vomiting or a severe headache please notify your doctor immediately.  FOLLOW-UP:  Please make an appointment with your surgeon as instructed. You do not need to follow up with anesthesia unless specifically instructed to do so.  WOUND CARE INSTRUCTIONS (if applicable):  Keep a dry clean  dressing on the anesthesia/puncture wound site if there is drainage.  Once the wound has quit draining you may leave it open to air.  Generally you should leave the bandage intact for twenty four hours unless there is drainage.  If the epidural site drains for more than 36-48 hours please call the anesthesia department.  QUESTIONS?:  Please feel free to call your physician or the hospital operator if you have any questions, and they will be happy to assist you.       A cataract is a clouding of the lens of the eye. When a lens becomes cloudy, vision is reduced based on the degree and nature of the clouding. Surgery may be needed to improve vision. Surgery removes the cloudy lens and usually replaces it with a substitute lens (intraocular lens, IOL). LET YOUR EYE DOCTOR KNOW ABOUT:  Allergies to food or medicine.  Medicines taken including herbs, eye drops, over-the-counter medicines, and creams.  Use of steroids (by mouth or creams).  Previous problems with anesthetics or numbing medicine.  History of bleeding problems or blood clots.  Previous surgery.  Other health problems, including diabetes and kidney problems.  Possibility of pregnancy, if this applies. RISKS AND COMPLICATIONS  Infection.  Inflammation of the eyeball (endophthalmitis) that can spread to both eyes (sympathetic ophthalmia).  Poor wound healing.  If an IOL is inserted, it can later fall out of proper position. This is very uncommon.  Clouding of the part of your eye  that holds an IOL in place. This is called an "after-cataract." These are uncommon but easily treated. BEFORE THE PROCEDURE  Do not eat or drink anything except small amounts of water for 8 to 12 before your surgery, or as directed by your caregiver.  Unless you are told otherwise, continue any eye drops you have been prescribed.  Talk to your primary caregiver about all other medicines that you take (both prescription and nonprescription). In  some cases, you may need to stop or change medicines near the time of your surgery. This is most important if you are taking blood-thinning medicine.Do not stop medicines unless you are told to do so.  Arrange for someone to drive you to and from the procedure.  Do not put contact lenses in either eye on the day of your surgery. PROCEDURE There is more than one method for safely removing a cataract. Your doctor can explain the differences and help determine which is best for you. Phacoemulsification surgery is the most common form of cataract surgery.  An injection is given behind the eye or eye drops are given to make this a painless procedure.  A small cut (incision) is made on the edge of the clear, dome-shaped surface that covers the front of the eye (cornea).  A tiny probe is painlessly inserted into the eye. This device gives off ultrasound waves that soften and break up the cloudy center of the lens. This makes it easier for the cloudy lens to be removed by suction.  An IOL may be implanted.  The normal lens of the eye is covered by a clear capsule. Part of that capsule is intentionally left in the eye to support the IOL.  Your surgeon may or may not use stitches to close the incision. There are other forms of cataract surgery that require a larger incision and stitches to close the eye. This approach is taken in cases where the doctor feels that the cataract cannot be easily removed using phacoemulsification. AFTER THE PROCEDURE  When an IOL is implanted, it does not need care. It becomes a permanent part of your eye and cannot be seen or felt.  Your doctor will schedule follow-up exams to check on your progress.  Review your other medicines with your doctor to see which can be resumed after surgery.  Use eye drops or take medicine as prescribed by your doctor.   This information is not intended to replace advice given to you by your health care provider. Make sure you discuss  any questions you have with your health care provider.   Document Released: 02/03/2011 Document Revised: 03/07/2014 Document Reviewed: 02/03/2011 Elsevier Interactive Patient Education [employer].

## 2015-05-06 ENCOUNTER — Encounter (HOSPITAL_COMMUNITY): Payer: Self-pay

## 2015-05-06 ENCOUNTER — Encounter (HOSPITAL_COMMUNITY)
Admission: RE | Admit: 2015-05-06 | Discharge: 2015-05-06 | Disposition: A | Discharge status: Home / Self Care | Payer: Medicare Other | Source: Ambulatory Visit | Attending: Ophthalmology | Admitting: Ophthalmology

## 2015-05-06 DIAGNOSIS — H2512 Age-related nuclear cataract, left eye: Secondary | ICD-10-CM | POA: Diagnosis not present

## 2015-05-06 DIAGNOSIS — J45909 Unspecified asthma, uncomplicated: Secondary | ICD-10-CM | POA: Diagnosis not present

## 2015-05-06 DIAGNOSIS — Z01812 Encounter for preprocedural laboratory examination: Secondary | ICD-10-CM | POA: Diagnosis not present

## 2015-05-06 DIAGNOSIS — E11319 Type 2 diabetes mellitus with unspecified diabetic retinopathy without macular edema: Secondary | ICD-10-CM | POA: Diagnosis not present

## 2015-05-06 DIAGNOSIS — E7800 Pure hypercholesterolemia, unspecified: Secondary | ICD-10-CM | POA: Diagnosis not present

## 2015-05-06 DIAGNOSIS — Z794 Long term (current) use of insulin: Secondary | ICD-10-CM | POA: Diagnosis not present

## 2015-05-06 DIAGNOSIS — Z79899 Other long term (current) drug therapy: Secondary | ICD-10-CM | POA: Diagnosis not present

## 2015-05-06 DIAGNOSIS — I10 Essential (primary) hypertension: Secondary | ICD-10-CM | POA: Diagnosis not present

## 2015-05-06 DIAGNOSIS — Z7951 Long term (current) use of inhaled steroids: Secondary | ICD-10-CM | POA: Diagnosis not present

## 2015-05-06 LAB — CBC
HCT: 34 % — ABNORMAL LOW (ref 36.0–46.0)
Hemoglobin: 10.8 g/dL — ABNORMAL LOW (ref 12.0–15.0)
MCH: 27 pg (ref 26.0–34.0)
MCHC: 31.8 g/dL (ref 30.0–36.0)
MCV: 85 fL (ref 78.0–100.0)
PLATELETS: 207 10*3/uL (ref 150–400)
RBC: 4 MIL/uL (ref 3.87–5.11)
RDW: 14.2 % (ref 11.5–15.5)
WBC: 7.2 10*3/uL (ref 4.0–10.5)

## 2015-05-07 ENCOUNTER — Other Ambulatory Visit: Payer: Self-pay | Admitting: *Deleted

## 2015-05-07 NOTE — Patient Outreach (Signed)
[place] [facility]) Care Management  05/07/2015  [name] September 24, 1965 [id number]   Telephone call attempt x 2.  Plan: Will follow up.  Sherrin Daisy, RN BSN [place] Management Coordinator [facility] Care Management  [phone]

## 2015-05-11 ENCOUNTER — Encounter (HOSPITAL_COMMUNITY): Payer: Self-pay | Admitting: *Deleted

## 2015-05-11 ENCOUNTER — Encounter (HOSPITAL_COMMUNITY)
Admission: RE | Disposition: A | Discharge status: Home / Self Care | Payer: Self-pay | Source: Ambulatory Visit | Attending: Ophthalmology

## 2015-05-11 ENCOUNTER — Ambulatory Visit (HOSPITAL_COMMUNITY)
Admission: RE | Admit: 2015-05-11 | Discharge: 2015-05-11 | Disposition: A | Discharge status: Home / Self Care | Payer: Medicare Other | Source: Ambulatory Visit | Attending: Ophthalmology | Admitting: Ophthalmology

## 2015-05-11 ENCOUNTER — Other Ambulatory Visit: Payer: Self-pay | Admitting: *Deleted

## 2015-05-11 ENCOUNTER — Ambulatory Visit (HOSPITAL_COMMUNITY): Payer: Medicare Other | Admitting: Anesthesiology

## 2015-05-11 DIAGNOSIS — J45909 Unspecified asthma, uncomplicated: Secondary | ICD-10-CM | POA: Insufficient documentation

## 2015-05-11 DIAGNOSIS — Z79899 Other long term (current) drug therapy: Secondary | ICD-10-CM | POA: Diagnosis not present

## 2015-05-11 DIAGNOSIS — Z01812 Encounter for preprocedural laboratory examination: Secondary | ICD-10-CM | POA: Insufficient documentation

## 2015-05-11 DIAGNOSIS — Z794 Long term (current) use of insulin: Secondary | ICD-10-CM | POA: Insufficient documentation

## 2015-05-11 DIAGNOSIS — E7800 Pure hypercholesterolemia, unspecified: Secondary | ICD-10-CM | POA: Insufficient documentation

## 2015-05-11 DIAGNOSIS — I10 Essential (primary) hypertension: Secondary | ICD-10-CM | POA: Insufficient documentation

## 2015-05-11 DIAGNOSIS — H2512 Age-related nuclear cataract, left eye: Secondary | ICD-10-CM | POA: Diagnosis not present

## 2015-05-11 DIAGNOSIS — Z7951 Long term (current) use of inhaled steroids: Secondary | ICD-10-CM | POA: Insufficient documentation

## 2015-05-11 DIAGNOSIS — E11319 Type 2 diabetes mellitus with unspecified diabetic retinopathy without macular edema: Secondary | ICD-10-CM | POA: Insufficient documentation

## 2015-05-11 DIAGNOSIS — J9601 Acute respiratory failure with hypoxia: Secondary | ICD-10-CM | POA: Diagnosis not present

## 2015-05-11 DIAGNOSIS — H25812 Combined forms of age-related cataract, left eye: Secondary | ICD-10-CM | POA: Diagnosis not present

## 2015-05-11 DIAGNOSIS — H269 Unspecified cataract: Secondary | ICD-10-CM | POA: Diagnosis not present

## 2015-05-11 HISTORY — PX: CATARACT EXTRACTION W/PHACO: SHX586

## 2015-05-11 LAB — GLUCOSE, CAPILLARY: Glucose-Capillary: 77 mg/dL (ref 65–99)

## 2015-05-11 SURGERY — PHACOEMULSIFICATION, CATARACT, WITH IOL INSERTION
Anesthesia: Monitor Anesthesia Care | Site: Eye | Laterality: Left

## 2015-05-11 MED ORDER — DEXTROSE 50 % IV SOLN
12.5000 g | Freq: Once | INTRAVENOUS | Status: AC
  Administered 2015-05-11: 12.5 g via INTRAVENOUS

## 2015-05-11 MED ORDER — EPINEPHRINE HCL 1 MG/ML IJ SOLN
INTRAOCULAR | Status: DC | PRN
  Administered 2015-05-11: 10:00:00

## 2015-05-11 MED ORDER — LIDOCAINE HCL (PF) 1 % IJ SOLN
INTRAMUSCULAR | Status: AC
  Filled 2015-05-11: qty 2

## 2015-05-11 MED ORDER — PROVISC 10 MG/ML IO SOLN
INTRAOCULAR | Status: DC | PRN
  Administered 2015-05-11: 0.85 mL via INTRAOCULAR

## 2015-05-11 MED ORDER — LIDOCAINE 3.5 % OP GEL OPTIME - NO CHARGE
OPHTHALMIC | Status: DC | PRN
  Administered 2015-05-11: 1 [drp] via OPHTHALMIC

## 2015-05-11 MED ORDER — EPINEPHRINE HCL 1 MG/ML IJ SOLN
INTRAMUSCULAR | Status: AC
  Filled 2015-05-11: qty 1

## 2015-05-11 MED ORDER — DEXTROSE 50 % IV SOLN
INTRAVENOUS | Status: AC
  Filled 2015-05-11: qty 50

## 2015-05-11 MED ORDER — MIDAZOLAM HCL 2 MG/2ML IJ SOLN
INTRAMUSCULAR | Status: AC
  Filled 2015-05-11: qty 2

## 2015-05-11 MED ORDER — PHENYLEPHRINE HCL 2.5 % OP SOLN
1.0000 [drp] | OPHTHALMIC | Status: AC
  Administered 2015-05-11 (×3): 1 [drp] via OPHTHALMIC

## 2015-05-11 MED ORDER — NEOMYCIN-POLYMYXIN-DEXAMETH 3.5-10000-0.1 OP SUSP
OPHTHALMIC | Status: DC | PRN
  Administered 2015-05-11: 2 [drp] via OPHTHALMIC

## 2015-05-11 MED ORDER — BSS IO SOLN
INTRAOCULAR | Status: DC | PRN
  Administered 2015-05-11: 15 mL via INTRAOCULAR

## 2015-05-11 MED ORDER — FENTANYL CITRATE (PF) 100 MCG/2ML IJ SOLN
INTRAMUSCULAR | Status: AC
  Filled 2015-05-11: qty 2

## 2015-05-11 MED ORDER — TETRACAINE HCL 0.5 % OP SOLN
1.0000 [drp] | OPHTHALMIC | Status: AC
  Administered 2015-05-11 (×3): 1 [drp] via OPHTHALMIC

## 2015-05-11 MED ORDER — LACTATED RINGERS IV SOLN
INTRAVENOUS | Status: DC
  Administered 2015-05-11: 09:00:00 via INTRAVENOUS

## 2015-05-11 MED ORDER — LIDOCAINE HCL (PF) 1 % IJ SOLN
INTRAMUSCULAR | Status: DC | PRN
  Administered 2015-05-11: .5 mL

## 2015-05-11 MED ORDER — LIDOCAINE HCL 3.5 % OP GEL
1.0000 "application " | Freq: Once | OPHTHALMIC | Status: AC
  Administered 2015-05-11: 1 via OPHTHALMIC

## 2015-05-11 MED ORDER — CYCLOPENTOLATE-PHENYLEPHRINE 0.2-1 % OP SOLN
1.0000 [drp] | OPHTHALMIC | Status: AC
  Administered 2015-05-11 (×3): 1 [drp] via OPHTHALMIC

## 2015-05-11 MED ORDER — POVIDONE-IODINE 5 % OP SOLN
OPHTHALMIC | Status: DC | PRN
  Administered 2015-05-11: 1 via OPHTHALMIC

## 2015-05-11 MED ORDER — MIDAZOLAM HCL 2 MG/2ML IJ SOLN
1.0000 mg | INTRAMUSCULAR | Status: DC | PRN
  Administered 2015-05-11: 2 mg via INTRAVENOUS

## 2015-05-11 MED ORDER — FENTANYL CITRATE (PF) 100 MCG/2ML IJ SOLN
25.0000 ug | INTRAMUSCULAR | Status: AC
  Administered 2015-05-11 (×2): 25 ug via INTRAVENOUS

## 2015-05-11 SURGICAL SUPPLY — 12 items
CLOTH BEACON ORANGE TIMEOUT ST (SAFETY) ×2 IMPLANT
EYE SHIELD UNIVERSAL CLEAR (GAUZE/BANDAGES/DRESSINGS) ×2 IMPLANT
GLOVE BIOGEL PI IND STRL 6.5 (GLOVE) ×1 IMPLANT
GLOVE BIOGEL PI IND STRL 7.0 (GLOVE) ×1 IMPLANT
GLOVE BIOGEL PI INDICATOR 6.5 (GLOVE) ×1
GLOVE BIOGEL PI INDICATOR 7.0 (GLOVE) ×1
PAD ARMBOARD 7.5X6 YLW CONV (MISCELLANEOUS) ×2 IMPLANT
SIGHTPATH CAT PROC W REG LENS (Ophthalmic Related) ×2 IMPLANT
SYRINGE LUER LOK 1CC (MISCELLANEOUS) ×2 IMPLANT
TAPE SURG TRANSPORE 1 IN (GAUZE/BANDAGES/DRESSINGS) ×1 IMPLANT
TAPE SURGICAL TRANSPORE 1 IN (GAUZE/BANDAGES/DRESSINGS) ×1
WATER STERILE IRR 250ML POUR (IV SOLUTION) ×2 IMPLANT

## 2015-05-11 NOTE — Op Note (Signed)
Date of Admission: 05/11/2015  Date of Surgery: 05/11/2015   Pre-Op Dx: Cataract Left Eye  Post-Op Dx: Senile Combined Cataract Left  Eye,  Dx Code [id number]  Surgeon: Tonny Branch, M.D.  Assistants: None  Anesthesia: Topical with MAC  Indications: Painless, progressive loss of vision with compromise of daily activities.  Surgery: Cataract Extraction with Intraocular lens Implant Left Eye  Discription: The patient had dilating drops and viscous lidocaine placed into the Left eye in the pre-op holding area. After transfer to the operating room, a time out was performed. The patient was then prepped and draped. Beginning with a 79 degree blade a paracentesis port was made at the surgeon's 2 o'clock position. The anterior chamber was then filled with 1% non-preserved lidocaine. This was followed by filling the anterior chamber with Provisc.  A 2.16mm keratome blade was used to make a clear corneal incision at the temporal limbus.  A bent cystatome needle was used to create a continuous tear capsulotomy. Hydrodissection was performed with balanced salt solution on a Fine canula. The lens nucleus was then removed using the phacoemulsification handpiece. Residual cortex was removed with the I&A handpiece. The anterior chamber and capsular bag were refilled with Provisc. A posterior chamber intraocular lens was placed into the capsular bag with it's injector. The implant was positioned with the Kuglan hook. The Provisc was then removed from the anterior chamber and capsular bag with the I&A handpiece. Stromal hydration of the main incision and paracentesis port was performed with BSS on a Fine canula. The wounds were tested for leak which was negative. The patient tolerated the procedure well. There were no operative complications. The patient was then transferred to the recovery room in stable condition.  Complications: None  Specimen: None  EBL: None  Prosthetic device: Hoya iSert 250, power 19.0 D, SN  [id number].

## 2015-05-11 NOTE — Patient Outreach (Signed)
[place] [facility]) Care Management  05/11/2015  [name] 03-24-1965 [id number]   Per EPIC review patient had outpatient procedure today; will follow up on alternate date.  Plan: Will follow up.  Sherrin Daisy, RN BSN CCM Care Management Coordinator [facility] Care Management  [phone] .

## 2015-05-11 NOTE — Anesthesia Postprocedure Evaluation (Signed)
  Anesthesia Post-op Note  Patient: [name]  Procedure(s) Performed: Procedure(s) (LRB): CATARACT EXTRACTION PHACO AND INTRAOCULAR LENS PLACEMENT LEFT EYE CDE=6.54 (Left)  Patient Location:  Short Stay  Anesthesia Type: MAC  Level of Consciousness: awake  Airway and Oxygen Therapy: Patient Spontanous Breathing  Post-op Pain: none  Post-op Assessment: Post-op Vital signs reviewed, Patient's Cardiovascular Status Stable, Respiratory Function Stable, Patent Airway, No signs of Nausea or vomiting and Pain level controlled  Post-op Vital Signs: Reviewed and stable  Complications: No apparent anesthesia complications

## 2015-05-11 NOTE — Discharge Instructions (Signed)
Anesthesia, Adult, Care After °Refer to this sheet in the next few weeks. These instructions provide you with information on caring for yourself after your procedure. Your health care provider may also give you more specific instructions. Your treatment has been planned according to current medical practices, but problems sometimes occur. Call your health care provider if you have any problems or questions after your procedure. °WHAT TO EXPECT AFTER THE PROCEDURE °After the procedure, it is typical to experience: °· Sleepiness. °· Nausea and vomiting. °HOME CARE INSTRUCTIONS °· For the first 24 hours after general anesthesia: °¨ Have a responsible person with you. °¨ Do not drive a car. If you are alone, do not take public transportation. °¨ Do not drink alcohol. °¨ Do not take medicine that has not been prescribed by your health care provider. °¨ Do not sign important papers or make important decisions. °¨ You may resume a normal diet and activities as directed by your health care provider. °· Change bandages (dressings) as directed. °· If you have questions or problems that seem related to general anesthesia, call the hospital and ask for the anesthetist or anesthesiologist on call. °SEEK MEDICAL CARE IF: °· You have nausea and vomiting that continue the day after anesthesia. °· You develop a rash. °SEEK IMMEDIATE MEDICAL CARE IF:  °· You have difficulty breathing. °· You have chest pain. °· You have any allergic problems. °  °This information is not intended to replace advice given to you by your health care provider. Make sure you discuss any questions you have with your health care provider. °  °Document Released: 05/23/2000 Document Revised: 03/07/2014 Document Reviewed: 06/15/2011 °Elsevier Interactive Patient Education ©2016 Elsevier Inc. ° °

## 2015-05-11 NOTE — Transfer of Care (Signed)
Immediate Anesthesia Transfer of Care Note  Patient: [name]  Procedure(s) Performed: Procedure(s) (LRB): CATARACT EXTRACTION PHACO AND INTRAOCULAR LENS PLACEMENT LEFT EYE CDE=6.54 (Left)  Patient Location: Shortstay  Anesthesia Type: MAC  Level of Consciousness: awake  Airway & Oxygen Therapy: Patient Spontanous Breathing   Post-op Assessment: Report given to PACU RN, Post -op Vital signs reviewed and stable and Patient moving all extremities  Post vital signs: Reviewed and stable  Complications: No apparent anesthesia complications

## 2015-05-11 NOTE — Anesthesia Preprocedure Evaluation (Signed)
Anesthesia Evaluation  Patient identified by MRN, date of birth, ID band Patient awake    Reviewed: Allergy & Precautions, H&P , NPO status , Patient's Chart, lab work & pertinent test results  Airway Mallampati: II  TM Distance: >3 FB Neck ROM: Full    Dental  (+) Edentulous Upper   Pulmonary asthma ,    breath sounds clear to auscultation       Cardiovascular hypertension, Pt. on medications +CHF   Rhythm:Regular Rate:Normal     Neuro/Psych    GI/Hepatic neg GERD  ,  Endo/Other  diabetes, Poorly Controlled, Type 2, Oral Hypoglycemic AgentsMorbid obesity  Renal/GU      Musculoskeletal   Abdominal   Peds  Hematology   Anesthesia Other Findings   Reproductive/Obstetrics                             Anesthesia Physical Anesthesia Plan  ASA: III  Anesthesia Plan: MAC   Post-op Pain Management:    Induction: Intravenous  Airway Management Planned: Nasal Cannula  Additional Equipment:   Intra-op Plan:   Post-operative Plan:   Informed Consent: I have reviewed the patients History and Physical, chart, labs and discussed the procedure including the risks, benefits and alternatives for the proposed anesthesia with the patient or authorized representative who has indicated his/her understanding and acceptance.     Plan Discussed with:   Anesthesia Plan Comments:         Anesthesia Quick Evaluation

## 2015-05-11 NOTE — Anesthesia Procedure Notes (Signed)
Procedure Name: MAC Date/Time: 05/11/2015 9:30 AM Performed by: Vista Deck Pre-anesthesia Checklist: Patient identified, Emergency Drugs available, Suction available, Timeout performed and Patient being monitored Patient Re-evaluated:Patient Re-evaluated prior to inductionOxygen Delivery Method: Nasal Cannula

## 2015-05-11 NOTE — H&P (Signed)
I have reviewed the H&P, the patient was re-examined, and I have identified no interval changes in medical condition and plan of care since the history and physical of record  

## 2015-05-12 ENCOUNTER — Encounter (HOSPITAL_COMMUNITY): Payer: Self-pay | Admitting: Ophthalmology

## 2015-05-12 LAB — GLUCOSE, CAPILLARY: GLUCOSE-CAPILLARY: 98 mg/dL (ref 65–99)

## 2015-05-13 ENCOUNTER — Other Ambulatory Visit: Payer: Self-pay | Admitting: *Deleted

## 2015-05-13 NOTE — Patient Outreach (Signed)
[place] [facility]) Care Management  05/13/2015  [name] [birth year] [id number]   Telephone call to patient who gave HIPPA verification.    Patient voices that her major health concern is that her diabetes is currently not controlled. States she recently had primary care appointment and has been referred to diabetic specialist. States primary care office will be setting up appointment for her and calling her back. States A1c level was drawn but she does not have that results yet. Voices that the last one she can remember last year was 81. States she is checking blood sugar three times daily and is on sliding scale insulin. States levels have been elevated last 2 days and have not met target of 120.  States she had diabetic classes some years ago and saw nutritionist  March 2016. States current weight 350 lbs and she would like to lose some weight.   Currently using community transportation and has no trouble getting to doctors' appointments.  Using local pharmacy for medications which delivers. States she manages medications and understands importance of taking medications consistently as instructed by MD. Other medical conditions include HTN, CKD, asthma, obesity, oxygen use at night.   Recommendation for referral to Health Coach for disease management. Patient consents to Health Coach referral.  Plan: Referral to care management assistant to refer to Health Coach for disease management.  Telephonic signing off. Sherrin Daisy, RN BSN [place] Management Coordinator [facility] Care Management  [phone]

## 2015-05-16 DIAGNOSIS — R809 Proteinuria, unspecified: Secondary | ICD-10-CM | POA: Diagnosis not present

## 2015-05-16 DIAGNOSIS — I10 Essential (primary) hypertension: Secondary | ICD-10-CM | POA: Diagnosis not present

## 2015-05-16 DIAGNOSIS — E559 Vitamin D deficiency, unspecified: Secondary | ICD-10-CM | POA: Diagnosis not present

## 2015-05-16 DIAGNOSIS — N183 Chronic kidney disease, stage 3 (moderate): Secondary | ICD-10-CM | POA: Diagnosis not present

## 2015-05-16 DIAGNOSIS — Z79899 Other long term (current) drug therapy: Secondary | ICD-10-CM | POA: Diagnosis not present

## 2015-05-20 DIAGNOSIS — N182 Chronic kidney disease, stage 2 (mild): Secondary | ICD-10-CM | POA: Diagnosis not present

## 2015-05-20 DIAGNOSIS — I10 Essential (primary) hypertension: Secondary | ICD-10-CM | POA: Diagnosis not present

## 2015-05-20 DIAGNOSIS — E1129 Type 2 diabetes mellitus with other diabetic kidney complication: Secondary | ICD-10-CM | POA: Diagnosis not present

## 2015-05-20 DIAGNOSIS — E559 Vitamin D deficiency, unspecified: Secondary | ICD-10-CM | POA: Diagnosis not present

## 2015-05-21 ENCOUNTER — Other Ambulatory Visit: Payer: Self-pay

## 2015-05-21 NOTE — Patient Outreach (Signed)
[facility]) Care Management  05/21/2015  [name] 06-30-1965 [id number]   Telephone call to patient for initial health coach assessment. No answer.  HIPAA compliant voice message left.   Plan: RN Health Coach will attempt patient within 1-2 weeks.   Jone Baseman, RN, MSN [place] [phone]

## 2015-05-28 ENCOUNTER — Other Ambulatory Visit: Payer: Self-pay

## 2015-05-28 NOTE — Patient Outreach (Signed)
[name] Care Management  [place]  05/28/2015   [name] 05-Apr-1965 [id number]  Subjective: Telephone call to patient for initial health coach assessment.  Patient receptive to call. Patient shares that her A1c last check was 13.0.  Patient states she wants to change her eating habits to get her diabetes under more control.  Discussed with patient A1c and goals.  Also discussed diet and limiting carbohydrates and naming foods to stay away from. She verbalized understanding.    Objective:   Current Medications:  Current Outpatient Prescriptions  Medication Sig Dispense Refill  . acetaminophen (TYLENOL) 500 MG tablet Take 1,000 mg by mouth every 6 (six) hours as needed for moderate pain.    Marland Kitchen albuterol (PROVENTIL HFA;VENTOLIN HFA) 108 (90 BASE) MCG/ACT inhaler Inhale 2 puffs into the lungs every 6 (six) hours as needed for wheezing.    Marland Kitchen albuterol (PROVENTIL) (2.5 MG/3ML) 0.083% nebulizer solution Take 3 mLs (2.5 mg total) by nebulization every 6 (six) hours as needed for wheezing or shortness of breath. 75 mL 12  . Alogliptin-Pioglitazone (OSENI) 25-15 MG TABS Take 1 tablet by mouth daily.    Marland Kitchen aspirin EC 81 MG tablet Take 81 mg by mouth daily.     Marland Kitchen atorvastatin (LIPITOR) 20 MG tablet Take 20 mg by mouth daily.     . carvedilol (COREG) 6.25 MG tablet Take 6.25 mg by mouth 2 (two) times daily.     . cholecalciferol (VITAMIN D) 1000 units tablet Take 1,000 Units by mouth daily.    . Fluticasone-Salmeterol (ADVAIR) 100-50 MCG/DOSE AEPB Inhale 1 puff into the lungs every 12 (twelve) hours as needed (for shortness of breath).     . gabapentin (NEURONTIN) 300 MG capsule Take 300 mg by mouth 3 (three) times daily.     . Insulin Isophane & Regular Human (HUMULIN 70/30 KWIKPEN) (70-30) 100 UNIT/ML PEN Inject 62 Units into the skin 2 (two) times daily. 15 mL 2  . lisinopril (PRINIVIL,ZESTRIL) 10 MG tablet Take 10 mg by mouth daily.    . metFORMIN  (GLUCOPHAGE) 1000 MG tablet Take 1,000 mg by mouth 2 (two) times daily with a meal.    . montelukast (SINGULAIR) 10 MG tablet Take 10 mg by mouth daily.     . nitroGLYCERIN (NITROSTAT) 0.4 MG SL tablet Place 0.4 mg under the tongue every 5 (five) minutes as needed for chest pain.    Marland Kitchen tetrahydrozoline 0.05 % ophthalmic solution Place 1 drop into both eyes daily as needed. Dry, Itchy Eyes    . torsemide (DEMADEX) 20 MG tablet Take 1 tablet (20 mg total) by mouth daily. (Patient taking differently: Take 40 mg by mouth daily. ) 30 tablet 1  . pantoprazole (PROTONIX) 20 MG tablet Take 1 tablet (20 mg total) by mouth daily. (Patient not taking: Reported on 05/28/2015) 30 tablet 0  . traMADol (ULTRAM) 50 MG tablet Take 1 tablet (50 mg total) by mouth every 6 (six) hours as needed. (Patient not taking: Reported on 05/28/2015) 20 tablet 0   No current facility-administered medications for this visit.    Functional Status:  In your present state of health, do you have any difficulty performing the following activities: 05/28/2015 05/06/2015  Hearing? N N  Vision? N N  Difficulty concentrating or making decisions? N N  Walking or climbing stairs? N N  Dressing or bathing? N N  Doing errands, shopping? N N  Preparing Food and eating ? N -  Using the Toilet?  N -  In the past six months, have you accidently leaked urine? N -  Do you have problems with loss of bowel control? N -  Managing your Medications? N -  Managing your Finances? N -  Housekeeping or managing your Housekeeping? N -    Fall/Depression Screening: PHQ 2/9 Scores 05/28/2015 05/13/2015  PHQ - 2 Score 0 0    Assessment: Patient will benefit from health coach outreach for disease management and support.    Plan:  [facility] CM Care Plan Problem One        Most Recent Value   Care Plan Problem One  Diabetes Knowledge Deficit   Role Documenting the Problem One  Health Coach   Care Plan for Problem One  Active   THN Long Term Goal (31-90  days)  Patient will decrease A1c by 0.5 points within 90 days.    THN Long Term Goal Start Date  05/28/15   Interventions for Problem One Long Term Goal  Discussed with patient complications of diabetes and importance of maintaining blood sugar control.  Also discussed kidney function in relation to diabetes.      THN CM Short Term Goal #1 (0-30 days)  Patient will be able to report maintaining low carbohydrate diet within 30 days.    THN CM Short Term Goal #1 Start Date  05/28/15   Interventions for Short Term Goal #1  Patient will be able to report maintaining low carbohydrate diet within 30 days.    THN CM Short Term Goal #2 (0-30 days)  Patient will report walking at least 3 times a week within the next 30 days.    THN CM Short Term Goal #2 Start Date  05/28/15   Interventions for Short Term Goal #2  Discussed importance of maintaining regular exercise routine to help in keeping blood sugar under control.      RN Health Coach will provide ongoing education for patient on diabetes through phone calls and sending printed information to patient for further discussion.  RN Health Coach will send welcome packet with consent to patient as well as printed information on diabetes.  RN Health Coach will send initial barriers letter, assessment, and care plan to primary care physician.  RN Health Coach will contact patient within one month and patient agrees to next contact.  Jone Baseman, RN, MSN [place] [phone]

## 2015-06-03 DIAGNOSIS — Z79899 Other long term (current) drug therapy: Secondary | ICD-10-CM | POA: Diagnosis not present

## 2015-06-03 DIAGNOSIS — I10 Essential (primary) hypertension: Secondary | ICD-10-CM | POA: Diagnosis not present

## 2015-06-03 DIAGNOSIS — N183 Chronic kidney disease, stage 3 (moderate): Secondary | ICD-10-CM | POA: Diagnosis not present

## 2015-06-08 DIAGNOSIS — N183 Chronic kidney disease, stage 3 (moderate): Secondary | ICD-10-CM | POA: Diagnosis not present

## 2015-06-08 DIAGNOSIS — E1165 Type 2 diabetes mellitus with hyperglycemia: Secondary | ICD-10-CM | POA: Diagnosis not present

## 2015-06-11 DIAGNOSIS — J9601 Acute respiratory failure with hypoxia: Secondary | ICD-10-CM | POA: Diagnosis not present

## 2015-06-18 ENCOUNTER — Other Ambulatory Visit: Payer: Self-pay

## 2015-06-18 NOTE — Patient Outreach (Signed)
[name] Care Management  [place]  06/18/2015   [name] [birth year] [id number]  Subjective: Telephone call to patient for monthly outreach. Patient reports she is doing ok but her sugars have been some lower like today 97.  However, patient reports highest reading 240.  Discussed with patient the importance of maintaining diabetic diet, portion control, and exercise in decreasing A1c.  She verbalized understanding.    Objective:   Encounter Medications:  Outpatient Encounter Prescriptions as of 06/18/2015  Medication Sig Note  . acetaminophen (TYLENOL) 500 MG tablet Take 1,000 mg by mouth every 6 (six) hours as needed for moderate pain.   Marland Kitchen albuterol (PROVENTIL HFA;VENTOLIN HFA) 108 (90 BASE) MCG/ACT inhaler Inhale 2 puffs into the lungs every 6 (six) hours as needed for wheezing.   Marland Kitchen albuterol (PROVENTIL) (2.5 MG/3ML) 0.083% nebulizer solution Take 3 mLs (2.5 mg total) by nebulization every 6 (six) hours as needed for wheezing or shortness of breath.   . Alogliptin-Pioglitazone (OSENI) 25-15 MG TABS Take 1 tablet by mouth daily.   Marland Kitchen aspirin EC 81 MG tablet Take 81 mg by mouth daily.  05/28/2015: 2 tablets daily  . atorvastatin (LIPITOR) 20 MG tablet Take 20 mg by mouth daily.    . carvedilol (COREG) 6.25 MG tablet Take 6.25 mg by mouth 2 (two) times daily.    . cholecalciferol (VITAMIN D) 1000 units tablet Take 1,000 Units by mouth daily.   . Fluticasone-Salmeterol (ADVAIR) 100-50 MCG/DOSE AEPB Inhale 1 puff into the lungs every 12 (twelve) hours as needed (for shortness of breath).    . gabapentin (NEURONTIN) 300 MG capsule Take 300 mg by mouth 3 (three) times daily.    . Insulin Isophane & Regular Human (HUMULIN 70/30 KWIKPEN) (70-30) 100 UNIT/ML PEN Inject 62 Units into the skin 2 (two) times daily. 05/11/2015: 35 unit yesterday  . lisinopril (PRINIVIL,ZESTRIL) 10 MG tablet Take 10 mg by mouth daily.   . metFORMIN (GLUCOPHAGE) 1000 MG tablet Take  1,000 mg by mouth 2 (two) times daily with a meal.   . montelukast (SINGULAIR) 10 MG tablet Take 10 mg by mouth daily.    . nitroGLYCERIN (NITROSTAT) 0.4 MG SL tablet Place 0.4 mg under the tongue every 5 (five) minutes as needed for chest pain.   . pantoprazole (PROTONIX) 20 MG tablet Take 1 tablet (20 mg total) by mouth daily.   Marland Kitchen tetrahydrozoline 0.05 % ophthalmic solution Place 1 drop into both eyes daily as needed. Dry, Itchy Eyes   . torsemide (DEMADEX) 20 MG tablet Take 1 tablet (20 mg total) by mouth daily. (Patient taking differently: Take 40 mg by mouth daily. ) 05/28/2015: Patient reports taking 60 mg Daily.  . traMADol (ULTRAM) 50 MG tablet Take 1 tablet (50 mg total) by mouth every 6 (six) hours as needed.    No facility-administered encounter medications on file as of 06/18/2015.    Functional Status:  In your present state of health, do you have any difficulty performing the following activities: 05/28/2015 05/06/2015  Hearing? N N  Vision? N N  Difficulty concentrating or making decisions? N N  Walking or climbing stairs? N N  Dressing or bathing? N N  Doing errands, shopping? N N  Preparing Food and eating ? N -  Using the Toilet? N -  In the past six months, have you accidently leaked urine? N -  Do you have problems with loss of bowel control? N -  Managing your Medications? N -  Managing your Finances? N -  Housekeeping or managing your Housekeeping? N -    Fall/Depression Screening: PHQ 2/9 Scores 06/18/2015 05/28/2015 05/13/2015  PHQ - 2 Score 0 0 0    Assessment: Patient continues to benefit from health coach outreach for disease management and support.    Plan:  [facility] CM Care Plan Problem One        Most Recent Value   Care Plan Problem One  Diabetes Knowledge Deficit   Role Documenting the Problem One  Health Coach   Care Plan for Problem One  Active   THN Long Term Goal (31-90 days)  Patient will decrease A1c by 0.5 points within 90 days.    THN Long Term  Goal Start Date  05/28/15   Interventions for Problem One [place] reviewed with patient complications of diabetes and importance of maintaining blood sugar control.   THN CM Short Term Goal #1 (0-30 days)  Patient will be able to report maintaining low carbohydrate diet within 30 days.    THN CM Short Term Goal #1 Start Date  06/18/15 [goal continued]   Interventions for Short Term Goal #1  RN Health Coach discussed avoidance of high carbohydrate foods such as potatoes, rice, and pastas.    THN CM Short Term Goal #2 (0-30 days)  Patient will report walking at least 3 times a week within the next 30 days.    THN CM Short Term Goal #2 Start Date  06/18/15 [goal continued]   Interventions for Short Term Goal #2  [place] reviewed the importance of maintaining regular exercise routine to help in keeping blood sugar under control.      RN Health Coach will contact patient within 1-2 weeks and patient agrees to next outreach.    Jone Baseman, RN, MSN [place] [phone]

## 2015-07-11 DIAGNOSIS — J9601 Acute respiratory failure with hypoxia: Secondary | ICD-10-CM | POA: Diagnosis not present

## 2015-07-16 ENCOUNTER — Other Ambulatory Visit: Payer: Self-pay

## 2015-07-16 DIAGNOSIS — L609 Nail disorder, unspecified: Secondary | ICD-10-CM | POA: Diagnosis not present

## 2015-07-16 DIAGNOSIS — E1140 Type 2 diabetes mellitus with diabetic neuropathy, unspecified: Secondary | ICD-10-CM | POA: Diagnosis not present

## 2015-07-16 DIAGNOSIS — L110 Acquired keratosis follicularis: Secondary | ICD-10-CM | POA: Diagnosis not present

## 2015-07-16 NOTE — Patient Outreach (Signed)
[facility]) Care Management  [place]  07/16/2015   [name] 10-12-1965 [id number]  Subjective: Telephone call to patient for monthly outreach.  Patient reports she just got back from the podiatrist. . Patient reports blood sugar 119 and that the highest was 230.  Patient reports that her weight is down to 313 lbs.  Patient [facility] to the [facility] for [facility].  Discussed with patient importance of limiting carbohydrates and sweets along with regular exercise to control sugars.  She verbalized understanding.   Objective:   Encounter Medications:  Outpatient Encounter Prescriptions as of 07/16/2015  Medication Sig Note  . acetaminophen (TYLENOL) 500 MG tablet Take 1,000 mg by mouth every 6 (six) hours as needed for moderate pain.   Marland Kitchen albuterol (PROVENTIL HFA;VENTOLIN HFA) 108 (90 BASE) MCG/ACT inhaler Inhale 2 puffs into the lungs every 6 (six) hours as needed for wheezing.   Marland Kitchen albuterol (PROVENTIL) (2.5 MG/3ML) 0.083% nebulizer solution Take 3 mLs (2.5 mg total) by nebulization every 6 (six) hours as needed for wheezing or shortness of breath.   . Alogliptin-Pioglitazone (OSENI) 25-15 MG TABS Take 1 tablet by mouth daily.   Marland Kitchen aspirin EC 81 MG tablet Take 81 mg by mouth daily.  05/28/2015: 2 tablets daily  . atorvastatin (LIPITOR) 20 MG tablet Take 20 mg by mouth daily.    . carvedilol (COREG) 6.25 MG tablet Take 6.25 mg by mouth 2 (two) times daily.    . cholecalciferol (VITAMIN D) 1000 units tablet Take 1,000 Units by mouth daily.   . Fluticasone-Salmeterol (ADVAIR) 100-50 MCG/DOSE AEPB Inhale 1 puff into the lungs every 12 (twelve) hours as needed (for shortness of breath).    . gabapentin (NEURONTIN) 300 MG capsule Take 300 mg by mouth 3 (three) times daily.    . Insulin Isophane & Regular Human (HUMULIN 70/30 KWIKPEN) (70-30) 100 UNIT/ML PEN Inject 62 Units into the skin 2 (two) times daily. 05/11/2015: 35 unit yesterday  . lisinopril (PRINIVIL,ZESTRIL) 10  MG tablet Take 10 mg by mouth daily.   . metFORMIN (GLUCOPHAGE) 1000 MG tablet Take 1,000 mg by mouth 2 (two) times daily with a meal.   . montelukast (SINGULAIR) 10 MG tablet Take 10 mg by mouth daily.    . nitroGLYCERIN (NITROSTAT) 0.4 MG SL tablet Place 0.4 mg under the tongue every 5 (five) minutes as needed for chest pain.   . pantoprazole (PROTONIX) 20 MG tablet Take 1 tablet (20 mg total) by mouth daily.   Marland Kitchen tetrahydrozoline 0.05 % ophthalmic solution Place 1 drop into both eyes daily as needed. Dry, Itchy Eyes   . torsemide (DEMADEX) 20 MG tablet Take 1 tablet (20 mg total) by mouth daily. (Patient taking differently: Take 40 mg by mouth daily. ) 05/28/2015: Patient reports taking 60 mg Daily.  . traMADol (ULTRAM) 50 MG tablet Take 1 tablet (50 mg total) by mouth every 6 (six) hours as needed.    No facility-administered encounter medications on file as of 07/16/2015.    Functional Status:  In your present state of health, do you have any difficulty performing the following activities: 05/28/2015 05/06/2015  Hearing? N N  Vision? N N  Difficulty concentrating or making decisions? N N  Walking or climbing stairs? N N  Dressing or bathing? N N  Doing errands, shopping? N N  Preparing Food and eating ? N -  Using the Toilet? N -  In the past six months, have you accidently leaked urine? N -  Do you have problems with loss of bowel control? N -  Managing your Medications? N -  Managing your Finances? N -  Housekeeping or managing your Housekeeping? N -    Fall/Depression Screening: PHQ 2/9 Scores 06/18/2015 05/28/2015 05/13/2015  PHQ - 2 Score 0 0 0    Assessment: Patient continues to benefit from health coach outreach for disease management and support.    Plan:  [facility] CM Care Plan Problem One        Most Recent Value   Care Plan Problem One  Diabetes Knowledge Deficit   Role Documenting the Problem One  Health Coach   Care Plan for Problem One  Active   THN Long Term Goal (31-90  days)  Patient will decrease A1c by 0.5 points within 90 days.    THN Long Term Goal Start Date  05/28/15   Interventions for Problem One [place] reiterated with patient complications of diabetes and importance of maintaining blood sugar control.   THN CM Short Term Goal #1 (0-30 days)  Patient will be able to report maintaining low carbohydrate diet within 30 days.    THN CM Short Term Goal #1 Start Date  07/16/15 [goal continued]   Interventions for Short Term Goal #1  RN Health Coach reiterated avoidance of high carbohydrate foods such as potatoes, rice, and pastas.    THN CM Short Term Goal #2 (0-30 days)  Patient will report walking at least 3 times a week within the next 30 days.    THN CM Short Term Goal #2 Start Date  07/16/15 [goal continued]   Interventions for Short Term Goal #2  RN Health Coach reiterated the importance of maintaining regular exercise routine to help in keeping blood sugar under control.      RN Health Coach will contact patient within one month and patient agrees to next outreach.    Jone Baseman, RN, MSN [place] [phone]

## 2015-08-11 ENCOUNTER — Other Ambulatory Visit: Payer: Self-pay

## 2015-08-11 DIAGNOSIS — J9601 Acute respiratory failure with hypoxia: Secondary | ICD-10-CM | POA: Diagnosis not present

## 2015-08-11 NOTE — Patient Outreach (Signed)
[facility]) Care Management  [place]  08/11/2015   [name] Dec 06, 1965 [id number]  Subjective: Telephone call to patient for monthly call.  Patient reports she is doing ok.  She reports that her blood sugars are still in the 200's in the evenings.  Patient continues to exercise regularly at the [facility]. Discussed with patient the importance of limiting carbohydrates and portion control in order to lower blood sugars.  She verbalized understanding.    Objective:   Encounter Medications:  Outpatient Encounter Prescriptions as of 08/11/2015  Medication Sig Note  . acetaminophen (TYLENOL) 500 MG tablet Take 1,000 mg by mouth every 6 (six) hours as needed for moderate pain.   Marland Kitchen albuterol (PROVENTIL HFA;VENTOLIN HFA) 108 (90 BASE) MCG/ACT inhaler Inhale 2 puffs into the lungs every 6 (six) hours as needed for wheezing.   Marland Kitchen albuterol (PROVENTIL) (2.5 MG/3ML) 0.083% nebulizer solution Take 3 mLs (2.5 mg total) by nebulization every 6 (six) hours as needed for wheezing or shortness of breath.   . Alogliptin-Pioglitazone (OSENI) 25-15 MG TABS Take 1 tablet by mouth daily.   Marland Kitchen aspirin EC 81 MG tablet Take 81 mg by mouth daily.  05/28/2015: 2 tablets daily  . atorvastatin (LIPITOR) 20 MG tablet Take 20 mg by mouth daily.    . carvedilol (COREG) 6.25 MG tablet Take 6.25 mg by mouth 2 (two) times daily.    . cholecalciferol (VITAMIN D) 1000 units tablet Take 1,000 Units by mouth daily.   . Fluticasone-Salmeterol (ADVAIR) 100-50 MCG/DOSE AEPB Inhale 1 puff into the lungs every 12 (twelve) hours as needed (for shortness of breath).    . gabapentin (NEURONTIN) 300 MG capsule Take 300 mg by mouth 3 (three) times daily.    . Insulin Isophane & Regular Human (HUMULIN 70/30 KWIKPEN) (70-30) 100 UNIT/ML PEN Inject 62 Units into the skin 2 (two) times daily. 05/11/2015: 35 unit yesterday  . lisinopril (PRINIVIL,ZESTRIL) 10 MG tablet Take 10 mg by mouth daily.   . metFORMIN  (GLUCOPHAGE) 1000 MG tablet Take 1,000 mg by mouth 2 (two) times daily with a meal.   . montelukast (SINGULAIR) 10 MG tablet Take 10 mg by mouth daily.    . nitroGLYCERIN (NITROSTAT) 0.4 MG SL tablet Place 0.4 mg under the tongue every 5 (five) minutes as needed for chest pain.   . pantoprazole (PROTONIX) 20 MG tablet Take 1 tablet (20 mg total) by mouth daily.   Marland Kitchen tetrahydrozoline 0.05 % ophthalmic solution Place 1 drop into both eyes daily as needed. Dry, Itchy Eyes   . torsemide (DEMADEX) 20 MG tablet Take 1 tablet (20 mg total) by mouth daily. (Patient taking differently: Take 40 mg by mouth daily. ) 05/28/2015: Patient reports taking 60 mg Daily.  . traMADol (ULTRAM) 50 MG tablet Take 1 tablet (50 mg total) by mouth every 6 (six) hours as needed.    No facility-administered encounter medications on file as of 08/11/2015.    Functional Status:  In your present state of health, do you have any difficulty performing the following activities: 05/28/2015 05/06/2015  Hearing? N N  Vision? N N  Difficulty concentrating or making decisions? N N  Walking or climbing stairs? N N  Dressing or bathing? N N  Doing errands, shopping? N N  Preparing Food and eating ? N -  Using the Toilet? N -  In the past six months, have you accidently leaked urine? N -  Do you have problems with loss of bowel control?  N -  Managing your Medications? N -  Managing your Finances? N -  Housekeeping or managing your Housekeeping? N -    Fall/Depression Screening: PHQ 2/9 Scores 06/18/2015 05/28/2015 05/13/2015  PHQ - 2 Score 0 0 0    Assessment: Patient continues to benefit from health coach outreach for disease management and support.    Plan:  [facility] CM Care Plan Problem One        Most Recent Value   Care Plan Problem One  Diabetes Knowledge Deficit   Role Documenting the Problem One  Health Coach   Care Plan for Problem One  Active   THN Long Term Goal (31-90 days)  Patient will decrease A1c by 0.5 points  within 90 days.    THN Long Term Goal Start Date  08/11/15 [goal continued]   Interventions for Problem One Long Term Goal  RN Health Coach reinforced with patient complications of diabetes and importance of maintaining blood sugar control.   THN CM Short Term Goal #1 (0-30 days)  Patient will be able to report maintaining low carbohydrate diet within 30 days.    THN CM Short Term Goal #1 Start Date  08/11/15 [goal continued]   Interventions for Short Term Goal #1  RN Health Coach reinforced avoidance of high carbohydrate foods such as potatoes, rice, and pastas.    THN CM Short Term Goal #2 (0-30 days)  Patient will report walking at least 3 times a week within the next 30 days.    THN CM Short Term Goal #2 Start Date  07/16/15   [facility] CM Short Term Goal #2 Met Date  08/11/15   Interventions for Short Term Goal #2  Patient exercising regularly at the [facility].      RN Health Coach will contact patient within one month and patient agrees to next outreach.  Jone Baseman, RN, MSN [place] [phone]

## 2015-09-07 DIAGNOSIS — E1122 Type 2 diabetes mellitus with diabetic chronic kidney disease: Secondary | ICD-10-CM | POA: Diagnosis not present

## 2015-09-07 DIAGNOSIS — I10 Essential (primary) hypertension: Secondary | ICD-10-CM | POA: Diagnosis not present

## 2015-09-08 ENCOUNTER — Other Ambulatory Visit: Payer: Self-pay

## 2015-09-08 NOTE — Patient Outreach (Signed)
[place] [facility]) Care Management  09/08/2015  [name] 21-Feb-1966 [id number]  Telephone call to patient for monthly call. No answer.  HIPAA compliant voice message left.   Plan: RN Health Coach will attempt patient again in the month of July.    Jone Baseman, RN, MSN [place] [phone]

## 2015-09-10 DIAGNOSIS — J9601 Acute respiratory failure with hypoxia: Secondary | ICD-10-CM | POA: Diagnosis not present

## 2015-09-14 DIAGNOSIS — D509 Iron deficiency anemia, unspecified: Secondary | ICD-10-CM | POA: Diagnosis not present

## 2015-09-14 DIAGNOSIS — R809 Proteinuria, unspecified: Secondary | ICD-10-CM | POA: Diagnosis not present

## 2015-09-14 DIAGNOSIS — E1122 Type 2 diabetes mellitus with diabetic chronic kidney disease: Secondary | ICD-10-CM | POA: Diagnosis not present

## 2015-09-14 DIAGNOSIS — E559 Vitamin D deficiency, unspecified: Secondary | ICD-10-CM | POA: Diagnosis not present

## 2015-09-14 DIAGNOSIS — Z79899 Other long term (current) drug therapy: Secondary | ICD-10-CM | POA: Diagnosis not present

## 2015-09-14 DIAGNOSIS — I10 Essential (primary) hypertension: Secondary | ICD-10-CM | POA: Diagnosis not present

## 2015-09-14 DIAGNOSIS — N183 Chronic kidney disease, stage 3 (moderate): Secondary | ICD-10-CM | POA: Diagnosis not present

## 2015-09-15 ENCOUNTER — Other Ambulatory Visit: Payer: Self-pay

## 2015-09-15 NOTE — Patient Outreach (Signed)
[facility]) Care Management  09/15/2015  [name] Jan 16, 1966 [id number]   Telephone call to patient to follow up on house calls outreach.  No answer.  HIPAA compliant voice message left.  Plan: RN Health Coach will attempt patient again in the month of July.    Jone Baseman, RN, MSN [place] [phone]

## 2015-09-16 DIAGNOSIS — I509 Heart failure, unspecified: Secondary | ICD-10-CM | POA: Diagnosis not present

## 2015-09-16 DIAGNOSIS — E875 Hyperkalemia: Secondary | ICD-10-CM | POA: Diagnosis not present

## 2015-09-16 DIAGNOSIS — N250 Renal osteodystrophy: Secondary | ICD-10-CM | POA: Diagnosis not present

## 2015-09-16 DIAGNOSIS — N183 Chronic kidney disease, stage 3 (moderate): Secondary | ICD-10-CM | POA: Diagnosis not present

## 2015-09-28 ENCOUNTER — Other Ambulatory Visit: Payer: Self-pay

## 2015-09-28 NOTE — Patient Outreach (Signed)
[facility] [facility]) Care Management  [place]  09/28/2015   [name] 1965/10/22 [id number]  Subjective: Telephone call to patient for monthly call.  Patient reports that is doing ok but having a few drops of her blood sugar.  Discussed with patient diet and what to do when blood sugar drops.  Also discussed with her mother on the line as well.  They both verbalized understanding.  Patient reports today's blood sugar was 120. Advised patient to keep a close log of her sugars to report to physician on next visit.  She verbalized understanding.  Objective:   Encounter Medications:  Outpatient Encounter Prescriptions as of 09/28/2015  Medication Sig Note  . acetaminophen (TYLENOL) 500 MG tablet Take 1,000 mg by mouth every 6 (six) hours as needed for moderate pain.   Marland Kitchen albuterol (PROVENTIL HFA;VENTOLIN HFA) 108 (90 BASE) MCG/ACT inhaler Inhale 2 puffs into the lungs every 6 (six) hours as needed for wheezing.   Marland Kitchen albuterol (PROVENTIL) (2.5 MG/3ML) 0.083% nebulizer solution Take 3 mLs (2.5 mg total) by nebulization every 6 (six) hours as needed for wheezing or shortness of breath.   . Alogliptin-Pioglitazone (OSENI) 25-15 MG TABS Take 1 tablet by mouth daily.   Marland Kitchen aspirin EC 81 MG tablet Take 81 mg by mouth daily.  05/28/2015: 2 tablets daily  . atorvastatin (LIPITOR) 20 MG tablet Take 20 mg by mouth daily.    . carvedilol (COREG) 6.25 MG tablet Take 6.25 mg by mouth 2 (two) times daily.    . cholecalciferol (VITAMIN D) 1000 units tablet Take 1,000 Units by mouth daily.   . Fluticasone-Salmeterol (ADVAIR) 100-50 MCG/DOSE AEPB Inhale 1 puff into the lungs every 12 (twelve) hours as needed (for shortness of breath).    . gabapentin (NEURONTIN) 300 MG capsule Take 300 mg by mouth 3 (three) times daily.    . Insulin Isophane & Regular Human (HUMULIN 70/30 KWIKPEN) (70-30) 100 UNIT/ML PEN Inject 62 Units into the skin 2 (two) times daily. 05/11/2015: 35 unit yesterday  .  lisinopril (PRINIVIL,ZESTRIL) 10 MG tablet Take 10 mg by mouth daily.   . metFORMIN (GLUCOPHAGE) 1000 MG tablet Take 1,000 mg by mouth 2 (two) times daily with a meal.   . montelukast (SINGULAIR) 10 MG tablet Take 10 mg by mouth daily.    . nitroGLYCERIN (NITROSTAT) 0.4 MG SL tablet Place 0.4 mg under the tongue every 5 (five) minutes as needed for chest pain.   . pantoprazole (PROTONIX) 20 MG tablet Take 1 tablet (20 mg total) by mouth daily.   Marland Kitchen tetrahydrozoline 0.05 % ophthalmic solution Place 1 drop into both eyes daily as needed. Dry, Itchy Eyes   . torsemide (DEMADEX) 20 MG tablet Take 1 tablet (20 mg total) by mouth daily. (Patient taking differently: Take 40 mg by mouth daily. ) 05/28/2015: Patient reports taking 60 mg Daily.  . traMADol (ULTRAM) 50 MG tablet Take 1 tablet (50 mg total) by mouth every 6 (six) hours as needed.    No facility-administered encounter medications on file as of 09/28/2015.     Functional Status:  In your present state of health, do you have any difficulty performing the following activities: 05/28/2015 05/06/2015  Hearing? N N  Vision? N N  Difficulty concentrating or making decisions? N N  Walking or climbing stairs? N N  Dressing or bathing? N N  Doing errands, shopping? N N  Preparing Food and eating ? N -  Using the Toilet? N -  In  the past six months, have you accidently leaked urine? N -  Do you have problems with loss of bowel control? N -  Managing your Medications? N -  Managing your Finances? N -  Housekeeping or managing your Housekeeping? N -  Some recent data might be hidden    Fall/Depression Screening: PHQ 2/9 Scores 09/28/2015 06/18/2015 05/28/2015 05/13/2015  PHQ - 2 Score 0 0 0 0    Assessment: Patient continues to benefit from health coach outreach for disease management and support.   Plan:  [facility] CM Care Plan Problem One   Flowsheet Row Most Recent Value  Care Plan Problem One  Diabetes Knowledge Deficit  Role Documenting the  Problem One  [place] for Problem One  Active  THN Long Term Goal (31-90 days)  Patient will decrease A1c by 0.5 points within 90 days.   THN Long Term Goal Start Date  08/11/15 [goal continued]  Interventions for Problem One Long Term Goal  RN Health Coach reiterated with patient complications of diabetes and importance of maintaining blood sugar control.  THN CM Short Term Goal #1 (0-30 days)  Patient will be able to report maintaining low carbohydrate diet within 30 days.   THN CM Short Term Goal #1 Start Date  09/28/15 [goal continued]  Interventions for Short Term Goal #1  RN Health Coach reinterated avoidance of high carbohydrate foods such as potatoes, rice, and pastas.      RN Health Coach will contact patient in the month of August and patient agrees to next outreach.  Jone Baseman, RN, MSN [place] [phone]

## 2015-10-11 DIAGNOSIS — J9601 Acute respiratory failure with hypoxia: Secondary | ICD-10-CM | POA: Diagnosis not present

## 2015-10-16 ENCOUNTER — Other Ambulatory Visit (HOSPITAL_COMMUNITY): Payer: Self-pay | Admitting: Family Medicine

## 2015-10-16 DIAGNOSIS — Z1231 Encounter for screening mammogram for malignant neoplasm of breast: Secondary | ICD-10-CM

## 2015-10-22 DIAGNOSIS — E1140 Type 2 diabetes mellitus with diabetic neuropathy, unspecified: Secondary | ICD-10-CM | POA: Diagnosis not present

## 2015-10-22 DIAGNOSIS — L609 Nail disorder, unspecified: Secondary | ICD-10-CM | POA: Diagnosis not present

## 2015-10-22 DIAGNOSIS — L110 Acquired keratosis follicularis: Secondary | ICD-10-CM | POA: Diagnosis not present

## 2015-10-23 ENCOUNTER — Other Ambulatory Visit: Payer: Self-pay

## 2015-10-23 NOTE — Patient Outreach (Signed)
[place] [facility]) Care Management  10/23/2015  SHAQUINA STEPIEN 04/20/1965 [id number]   Telephone call to patient for monthly call.  No answer.  HIPAA compliant voice message left.   Plan: RN Health Coach will attempt patient again in the month of September.   Jone Baseman, RN, MSN [place] [phone]

## 2015-10-26 ENCOUNTER — Other Ambulatory Visit: Payer: Self-pay

## 2015-10-26 NOTE — Patient Outreach (Signed)
[facility]) Care Management  [place]  10/26/2015   [name] [birth year] [id number]  Subjective: Telephone call to patient for monthly call.  Patient reports she is doing ok.  She reports that she has not had any episodes of low blood sugars.  She states however that her blood sugar highest was 300 after dinner.  Patient reports she has a problem with knowing when to say when. Discussed with patient the importance of portion control and how starchy items increase her blood sugar.  She verbalized understanding.  Objective:   Encounter Medications:  Outpatient Encounter Prescriptions as of 10/26/2015  Medication Sig Note  . acetaminophen (TYLENOL) 500 MG tablet Take 1,000 mg by mouth every 6 (six) hours as needed for moderate pain.   Marland Kitchen albuterol (PROVENTIL HFA;VENTOLIN HFA) 108 (90 BASE) MCG/ACT inhaler Inhale 2 puffs into the lungs every 6 (six) hours as needed for wheezing.   Marland Kitchen albuterol (PROVENTIL) (2.5 MG/3ML) 0.083% nebulizer solution Take 3 mLs (2.5 mg total) by nebulization every 6 (six) hours as needed for wheezing or shortness of breath.   . Alogliptin-Pioglitazone (OSENI) 25-15 MG TABS Take 1 tablet by mouth daily.   Marland Kitchen aspirin EC 81 MG tablet Take 81 mg by mouth daily.  05/28/2015: 2 tablets daily  . atorvastatin (LIPITOR) 20 MG tablet Take 20 mg by mouth daily.    . carvedilol (COREG) 6.25 MG tablet Take 6.25 mg by mouth 2 (two) times daily.    . cholecalciferol (VITAMIN D) 1000 units tablet Take 1,000 Units by mouth daily.   . Fluticasone-Salmeterol (ADVAIR) 100-50 MCG/DOSE AEPB Inhale 1 puff into the lungs every 12 (twelve) hours as needed (for shortness of breath).    . gabapentin (NEURONTIN) 300 MG capsule Take 300 mg by mouth 3 (three) times daily.    . Insulin Isophane & Regular Human (HUMULIN 70/30 KWIKPEN) (70-30) 100 UNIT/ML PEN Inject 62 Units into the skin 2 (two) times daily. 05/11/2015: 35 unit yesterday  . lisinopril  (PRINIVIL,ZESTRIL) 10 MG tablet Take 10 mg by mouth daily.   . metFORMIN (GLUCOPHAGE) 1000 MG tablet Take 1,000 mg by mouth 2 (two) times daily with a meal.   . montelukast (SINGULAIR) 10 MG tablet Take 10 mg by mouth daily.    . nitroGLYCERIN (NITROSTAT) 0.4 MG SL tablet Place 0.4 mg under the tongue every 5 (five) minutes as needed for chest pain.   . pantoprazole (PROTONIX) 20 MG tablet Take 1 tablet (20 mg total) by mouth daily.   Marland Kitchen tetrahydrozoline 0.05 % ophthalmic solution Place 1 drop into both eyes daily as needed. Dry, Itchy Eyes   . torsemide (DEMADEX) 20 MG tablet Take 1 tablet (20 mg total) by mouth daily. (Patient taking differently: Take 40 mg by mouth daily. ) 05/28/2015: Patient reports taking 60 mg Daily.  . traMADol (ULTRAM) 50 MG tablet Take 1 tablet (50 mg total) by mouth every 6 (six) hours as needed.    No facility-administered encounter medications on file as of 10/26/2015.     Functional Status:  In your present state of health, do you have any difficulty performing the following activities: 05/28/2015 05/06/2015  Hearing? N N  Vision? N N  Difficulty concentrating or making decisions? N N  Walking or climbing stairs? N N  Dressing or bathing? N N  Doing errands, shopping? N N  Preparing Food and eating ? N -  Using the Toilet? N -  In the past six months, have you accidently  leaked urine? N -  Do you have problems with loss of bowel control? N -  Managing your Medications? N -  Managing your Finances? N -  Housekeeping or managing your Housekeeping? N -  Some recent data might be hidden    Fall/Depression Screening: PHQ 2/9 Scores 10/26/2015 09/28/2015 06/18/2015 05/28/2015 05/13/2015  PHQ - 2 Score 0 0 0 0 0    Assessment: Patient continues to benefit from health coach outreach for disease management and support.   Plan:  [facility] CM Care Plan Problem One   Flowsheet Row Most Recent Value  Care Plan Problem One  Diabetes Knowledge Deficit  Role Documenting the  Problem One  [place] for Problem One  Active  THN Long Term Goal (31-90 days)  Patient will decrease A1c by 0.5 points within 90 days.   THN Long Term Goal Start Date  08/11/15 [goal continued]  Interventions for Problem One Long Term Goal  RN Health Coach reinforced with patient complications of diabetes and importance of maintaining blood sugar control.  THN CM Short Term Goal #1 (0-30 days)  Patient will be able to report maintaining low carbohydrate diet within 30 days.   THN CM Short Term Goal #1 Start Date  10/26/15 [goal continued]  Interventions for Short Term Goal #1  RN Health Coach reinforced avoidance of high carbohydrate foods such as potatoes, rice, and pastas.      RN Health Coach will contact patient in the month of September and patient agrees to next outreach.  Jone Baseman, RN, MSN [place] [phone]

## 2015-10-29 ENCOUNTER — Ambulatory Visit (HOSPITAL_COMMUNITY)
Admission: RE | Admit: 2015-10-29 | Discharge: 2015-10-29 | Disposition: A | Discharge status: Home / Self Care | Payer: Medicare Other | Source: Ambulatory Visit | Attending: Family Medicine | Admitting: Family Medicine

## 2015-10-29 DIAGNOSIS — Z1231 Encounter for screening mammogram for malignant neoplasm of breast: Secondary | ICD-10-CM | POA: Insufficient documentation

## 2015-11-11 ENCOUNTER — Other Ambulatory Visit: Payer: Self-pay

## 2015-11-11 DIAGNOSIS — E1165 Type 2 diabetes mellitus with hyperglycemia: Secondary | ICD-10-CM | POA: Diagnosis not present

## 2015-11-11 DIAGNOSIS — J45909 Unspecified asthma, uncomplicated: Secondary | ICD-10-CM | POA: Diagnosis not present

## 2015-11-11 DIAGNOSIS — J9601 Acute respiratory failure with hypoxia: Secondary | ICD-10-CM | POA: Diagnosis not present

## 2015-11-11 DIAGNOSIS — R05 Cough: Secondary | ICD-10-CM | POA: Diagnosis not present

## 2015-11-11 NOTE — Patient Outreach (Signed)
[name]) Care Management  11/11/2015  [name] December 23, 1965 [id number]   Telephone call to patient for monthly call. No answer.  Unable to leave a message.  Plan: RN Health Coach will attempt patient again in the month os September.   Jone Baseman, RN, MSN [place] [phone]

## 2015-11-23 DIAGNOSIS — I10 Essential (primary) hypertension: Secondary | ICD-10-CM | POA: Diagnosis not present

## 2015-11-23 DIAGNOSIS — D649 Anemia, unspecified: Secondary | ICD-10-CM | POA: Diagnosis not present

## 2015-11-23 DIAGNOSIS — E559 Vitamin D deficiency, unspecified: Secondary | ICD-10-CM | POA: Diagnosis not present

## 2015-11-23 DIAGNOSIS — R809 Proteinuria, unspecified: Secondary | ICD-10-CM | POA: Diagnosis not present

## 2015-11-23 DIAGNOSIS — Z79899 Other long term (current) drug therapy: Secondary | ICD-10-CM | POA: Diagnosis not present

## 2015-11-23 DIAGNOSIS — N183 Chronic kidney disease, stage 3 (moderate): Secondary | ICD-10-CM | POA: Diagnosis not present

## 2015-11-25 DIAGNOSIS — N183 Chronic kidney disease, stage 3 (moderate): Secondary | ICD-10-CM | POA: Diagnosis not present

## 2015-11-25 DIAGNOSIS — R809 Proteinuria, unspecified: Secondary | ICD-10-CM | POA: Diagnosis not present

## 2015-11-25 DIAGNOSIS — E559 Vitamin D deficiency, unspecified: Secondary | ICD-10-CM | POA: Diagnosis not present

## 2015-11-25 DIAGNOSIS — I10 Essential (primary) hypertension: Secondary | ICD-10-CM | POA: Diagnosis not present

## 2015-11-26 ENCOUNTER — Other Ambulatory Visit: Payer: Self-pay

## 2015-11-26 NOTE — Patient Outreach (Signed)
[name]) Care Management  11/26/2015  [name] 05-04-1965 [id number]   2nd telephone call to patient for monthly call. No answer.  Unable to leave a message.  Plan: RN Health Coach will attempt patient in the month of October.    Jone Baseman, RN, MSN [place] [phone]

## 2015-12-11 ENCOUNTER — Other Ambulatory Visit: Payer: Self-pay

## 2015-12-11 DIAGNOSIS — J9601 Acute respiratory failure with hypoxia: Secondary | ICD-10-CM | POA: Diagnosis not present

## 2015-12-11 NOTE — Patient Outreach (Signed)
[facility]) Care Management  [place]  12/11/2015   AMIL MOSEMAN [birth year] [id number]  Subjective: Telephone call to patient for monthly call.  Patient reports she is doing good. She reports that her sugars are no longer dropping.  Today's blood sugar was 175.  Patient continues to watch diet and exercise regularly at the [facility].  Reviewed with patient the importance of continuing to watch diet and exercise in order to control sugars.  She verbalized understanding.   Objective:   Encounter Medications:  Outpatient Encounter Prescriptions as of 12/11/2015  Medication Sig Note  . acetaminophen (TYLENOL) 500 MG tablet Take 1,000 mg by mouth every 6 (six) hours as needed for moderate pain.   Marland Kitchen albuterol (PROVENTIL HFA;VENTOLIN HFA) 108 (90 BASE) MCG/ACT inhaler Inhale 2 puffs into the lungs every 6 (six) hours as needed for wheezing.   Marland Kitchen albuterol (PROVENTIL) (2.5 MG/3ML) 0.083% nebulizer solution Take 3 mLs (2.5 mg total) by nebulization every 6 (six) hours as needed for wheezing or shortness of breath.   . Alogliptin-Pioglitazone (OSENI) 25-15 MG TABS Take 1 tablet by mouth daily.   Marland Kitchen aspirin EC 81 MG tablet Take 81 mg by mouth daily.  05/28/2015: 2 tablets daily  . atorvastatin (LIPITOR) 20 MG tablet Take 20 mg by mouth daily.    . carvedilol (COREG) 6.25 MG tablet Take 6.25 mg by mouth 2 (two) times daily.    . cholecalciferol (VITAMIN D) 1000 units tablet Take 1,000 Units by mouth daily.   . Fluticasone-Salmeterol (ADVAIR) 100-50 MCG/DOSE AEPB Inhale 1 puff into the lungs every 12 (twelve) hours as needed (for shortness of breath).    . gabapentin (NEURONTIN) 300 MG capsule Take 300 mg by mouth 3 (three) times daily.    . Insulin Isophane & Regular Human (HUMULIN 70/30 KWIKPEN) (70-30) 100 UNIT/ML PEN Inject 62 Units into the skin 2 (two) times daily. 05/11/2015: 35 unit yesterday  . lisinopril (PRINIVIL,ZESTRIL) 10 MG tablet Take 10 mg by mouth daily.   .  metFORMIN (GLUCOPHAGE) 1000 MG tablet Take 1,000 mg by mouth 2 (two) times daily with a meal.   . montelukast (SINGULAIR) 10 MG tablet Take 10 mg by mouth daily.    . nitroGLYCERIN (NITROSTAT) 0.4 MG SL tablet Place 0.4 mg under the tongue every 5 (five) minutes as needed for chest pain.   . pantoprazole (PROTONIX) 20 MG tablet Take 1 tablet (20 mg total) by mouth daily.   Marland Kitchen tetrahydrozoline 0.05 % ophthalmic solution Place 1 drop into both eyes daily as needed. Dry, Itchy Eyes   . torsemide (DEMADEX) 20 MG tablet Take 1 tablet (20 mg total) by mouth daily. (Patient taking differently: Take 40 mg by mouth daily. ) 05/28/2015: Patient reports taking 60 mg Daily.  . traMADol (ULTRAM) 50 MG tablet Take 1 tablet (50 mg total) by mouth every 6 (six) hours as needed.    No facility-administered encounter medications on file as of 12/11/2015.     Functional Status:  In your present state of health, do you have any difficulty performing the following activities: 05/28/2015 05/06/2015  Hearing? N N  Vision? N N  Difficulty concentrating or making decisions? N N  Walking or climbing stairs? N N  Dressing or bathing? N N  Doing errands, shopping? N N  Preparing Food and eating ? N -  Using the Toilet? N -  In the past six months, have you accidently leaked urine? N -  Do you have problems with  loss of bowel control? N -  Managing your Medications? N -  Managing your Finances? N -  Housekeeping or managing your Housekeeping? N -  Some recent data might be hidden    Fall/Depression Screening: PHQ 2/9 Scores 12/11/2015 10/26/2015 09/28/2015 06/18/2015 05/28/2015 05/13/2015  PHQ - 2 Score 0 0 0 0 0 0    Assessment: Patient continues to benefit from health coach outreach for disease management and support.    Plan:  [facility] CM Care Plan Problem One   Flowsheet Row Most Recent Value  Care Plan Problem One  Diabetes Knowledge Deficit  Role Documenting the Problem One  [place] for Problem  One  Active  THN Long Term Goal (31-90 days)  Patient will decrease A1c by 0.5 points within 90 days.   THN Long Term Goal Start Date  12/11/15 [goal continued]  Interventions for Problem One Long Term Goal  RN Health Coach reviewed with patient complications of diabetes and importance of maintaining blood sugar control.  THN CM Short Term Goal #1 (0-30 days)  Patient will be able to report maintaining low carbohydrate diet within 30 days.   THN CM Short Term Goal #1 Start Date  12/11/15 [goal continued]  Interventions for Short Term Goal #1  RN Health Coach reviewed avoidance of high carbohydrate foods such as potatoes, rice, and pastas.       RN Health Coach will contact patient in the month of November and patient agrees to next outreach.  Jone Baseman, RN, MSN [place] [phone]

## 2016-01-06 DIAGNOSIS — J45909 Unspecified asthma, uncomplicated: Secondary | ICD-10-CM | POA: Diagnosis not present

## 2016-01-06 DIAGNOSIS — R05 Cough: Secondary | ICD-10-CM | POA: Diagnosis not present

## 2016-01-06 DIAGNOSIS — E1165 Type 2 diabetes mellitus with hyperglycemia: Secondary | ICD-10-CM | POA: Diagnosis not present

## 2016-01-06 DIAGNOSIS — Z23 Encounter for immunization: Secondary | ICD-10-CM | POA: Diagnosis not present

## 2016-01-09 DIAGNOSIS — J45909 Unspecified asthma, uncomplicated: Secondary | ICD-10-CM | POA: Diagnosis not present

## 2016-01-11 ENCOUNTER — Other Ambulatory Visit: Payer: Self-pay

## 2016-01-11 DIAGNOSIS — J9601 Acute respiratory failure with hypoxia: Secondary | ICD-10-CM | POA: Diagnosis not present

## 2016-01-11 NOTE — Patient Outreach (Signed)
[facility]) Care Management  [name]  01/11/2016   [name] 05-Nov-1965 [id number]  Subjective: Telephone call to patient for monthly call. Patient reports she is doing ok.  She reports that her blood sugar today was 120 but it has been in the 200's recently.  Discussed with patient importance of diet and portion control. She verbalized understanding.  Discussed with patient moving call to every other month as patient continues to control her diabetes. She is in agreement.    Objective:   Encounter Medications:  Outpatient Encounter Prescriptions as of 01/11/2016  Medication Sig Note  . acetaminophen (TYLENOL) 500 MG tablet Take 1,000 mg by mouth every 6 (six) hours as needed for moderate pain.   Marland Kitchen albuterol (PROVENTIL HFA;VENTOLIN HFA) 108 (90 BASE) MCG/ACT inhaler Inhale 2 puffs into the lungs every 6 (six) hours as needed for wheezing.   Marland Kitchen albuterol (PROVENTIL) (2.5 MG/3ML) 0.083% nebulizer solution Take 3 mLs (2.5 mg total) by nebulization every 6 (six) hours as needed for wheezing or shortness of breath.   . Alogliptin-Pioglitazone (OSENI) 25-15 MG TABS Take 1 tablet by mouth daily.   Marland Kitchen aspirin EC 81 MG tablet Take 81 mg by mouth daily.  05/28/2015: 2 tablets daily  . atorvastatin (LIPITOR) 20 MG tablet Take 20 mg by mouth daily.    . carvedilol (COREG) 6.25 MG tablet Take 6.25 mg by mouth 2 (two) times daily.    . cholecalciferol (VITAMIN D) 1000 units tablet Take 1,000 Units by mouth daily.   . Fluticasone-Salmeterol (ADVAIR) 100-50 MCG/DOSE AEPB Inhale 1 puff into the lungs every 12 (twelve) hours as needed (for shortness of breath).    . gabapentin (NEURONTIN) 300 MG capsule Take 300 mg by mouth 3 (three) times daily.    . Insulin Isophane & Regular Human (HUMULIN 70/30 KWIKPEN) (70-30) 100 UNIT/ML PEN Inject 62 Units into the skin 2 (two) times daily. 05/11/2015: 35 unit yesterday  . lisinopril (PRINIVIL,ZESTRIL) 10 MG tablet Take 10 mg by  mouth daily.   . metFORMIN (GLUCOPHAGE) 1000 MG tablet Take 1,000 mg by mouth 2 (two) times daily with a meal.   . montelukast (SINGULAIR) 10 MG tablet Take 10 mg by mouth daily.    . nitroGLYCERIN (NITROSTAT) 0.4 MG SL tablet Place 0.4 mg under the tongue every 5 (five) minutes as needed for chest pain.   . pantoprazole (PROTONIX) 20 MG tablet Take 1 tablet (20 mg total) by mouth daily.   Marland Kitchen tetrahydrozoline 0.05 % ophthalmic solution Place 1 drop into both eyes daily as needed. Dry, Itchy Eyes   . torsemide (DEMADEX) 20 MG tablet Take 1 tablet (20 mg total) by mouth daily. (Patient taking differently: Take 40 mg by mouth daily. ) 05/28/2015: Patient reports taking 60 mg Daily.  . traMADol (ULTRAM) 50 MG tablet Take 1 tablet (50 mg total) by mouth every 6 (six) hours as needed.    No facility-administered encounter medications on file as of 01/11/2016.     Functional Status:  In your present state of health, do you have any difficulty performing the following activities: 05/28/2015 05/06/2015  Hearing? N N  Vision? N N  Difficulty concentrating or making decisions? N N  Walking or climbing stairs? N N  Dressing or bathing? N N  Doing errands, shopping? N N  Preparing Food and eating ? N -  Using the Toilet? N -  In the past six months, have you accidently leaked urine? N -  Do you  have problems with loss of bowel control? N -  Managing your Medications? N -  Managing your Finances? N -  Housekeeping or managing your Housekeeping? N -  Some recent data might be hidden    Fall/Depression Screening: PHQ 2/9 Scores 01/11/2016 12/11/2015 10/26/2015 09/28/2015 06/18/2015 05/28/2015 05/13/2015  PHQ - 2 Score 0 0 0 0 0 0 0    Assessment: Patient continues to benefit from health coach outreach for disease management and support.    Plan:  [facility] CM Care Plan Problem One   Flowsheet Row Most Recent Value  Care Plan Problem One  Diabetes Knowledge Deficit  Role Documenting the Problem One  [place] for Problem One  Active  THN Long Term Goal (31-90 days)  Patient will decrease A1c by 0.5 points within 90 days.   THN Long Term Goal Start Date  01/11/16 [goal continued]  Interventions for Problem One Long Term Goal  RN Health Coach reiterated with patient complications of diabetes and importance of maintaining blood sugar control.  THN CM Short Term Goal #1 (0-30 days)  Patient will be able to report maintaining low carbohydrate diet within 30 days.   THN CM Short Term Goal #1 Start Date  01/11/16 [goal continued]  Interventions for Short Term Goal #1  RN Health Coach reiterated avoidance of high carbohydrate foods such as potatoes, rice, and pastas.      RN Health Coach will contact patient in the month of January and patient agrees to next outreach.  Jone Baseman, RN, MSN [place] [phone]

## 2016-01-14 DIAGNOSIS — L110 Acquired keratosis follicularis: Secondary | ICD-10-CM | POA: Diagnosis not present

## 2016-01-14 DIAGNOSIS — E1140 Type 2 diabetes mellitus with diabetic neuropathy, unspecified: Secondary | ICD-10-CM | POA: Diagnosis not present

## 2016-01-14 DIAGNOSIS — L609 Nail disorder, unspecified: Secondary | ICD-10-CM | POA: Diagnosis not present

## 2016-01-15 DIAGNOSIS — J9601 Acute respiratory failure with hypoxia: Secondary | ICD-10-CM | POA: Diagnosis not present

## 2016-01-15 DIAGNOSIS — J45901 Unspecified asthma with (acute) exacerbation: Secondary | ICD-10-CM | POA: Diagnosis not present

## 2016-02-14 DIAGNOSIS — J9601 Acute respiratory failure with hypoxia: Secondary | ICD-10-CM | POA: Diagnosis not present

## 2016-02-14 DIAGNOSIS — J45901 Unspecified asthma with (acute) exacerbation: Secondary | ICD-10-CM | POA: Diagnosis not present

## 2016-03-01 DIAGNOSIS — R809 Proteinuria, unspecified: Secondary | ICD-10-CM | POA: Diagnosis not present

## 2016-03-01 DIAGNOSIS — D509 Iron deficiency anemia, unspecified: Secondary | ICD-10-CM | POA: Diagnosis not present

## 2016-03-01 DIAGNOSIS — E559 Vitamin D deficiency, unspecified: Secondary | ICD-10-CM | POA: Diagnosis not present

## 2016-03-01 DIAGNOSIS — Z79899 Other long term (current) drug therapy: Secondary | ICD-10-CM | POA: Diagnosis not present

## 2016-03-01 DIAGNOSIS — I10 Essential (primary) hypertension: Secondary | ICD-10-CM | POA: Diagnosis not present

## 2016-03-01 DIAGNOSIS — N183 Chronic kidney disease, stage 3 (moderate): Secondary | ICD-10-CM | POA: Diagnosis not present

## 2016-03-02 DIAGNOSIS — N183 Chronic kidney disease, stage 3 (moderate): Secondary | ICD-10-CM | POA: Diagnosis not present

## 2016-03-02 DIAGNOSIS — E559 Vitamin D deficiency, unspecified: Secondary | ICD-10-CM | POA: Diagnosis not present

## 2016-03-02 DIAGNOSIS — E875 Hyperkalemia: Secondary | ICD-10-CM | POA: Diagnosis not present

## 2016-03-02 DIAGNOSIS — N2581 Secondary hyperparathyroidism of renal origin: Secondary | ICD-10-CM | POA: Diagnosis not present

## 2016-03-02 DIAGNOSIS — I10 Essential (primary) hypertension: Secondary | ICD-10-CM | POA: Diagnosis not present

## 2016-03-04 DIAGNOSIS — N183 Chronic kidney disease, stage 3 (moderate): Secondary | ICD-10-CM | POA: Diagnosis not present

## 2016-03-04 DIAGNOSIS — Z79899 Other long term (current) drug therapy: Secondary | ICD-10-CM | POA: Diagnosis not present

## 2016-03-04 DIAGNOSIS — I10 Essential (primary) hypertension: Secondary | ICD-10-CM | POA: Diagnosis not present

## 2016-03-07 ENCOUNTER — Other Ambulatory Visit: Payer: Self-pay

## 2016-03-07 DIAGNOSIS — N183 Chronic kidney disease, stage 3 (moderate): Secondary | ICD-10-CM | POA: Diagnosis not present

## 2016-03-07 DIAGNOSIS — Z79899 Other long term (current) drug therapy: Secondary | ICD-10-CM | POA: Diagnosis not present

## 2016-03-07 DIAGNOSIS — I10 Essential (primary) hypertension: Secondary | ICD-10-CM | POA: Diagnosis not present

## 2016-03-07 NOTE — Patient Outreach (Signed)
[facility]) Care Management  [place]  03/07/2016   [name] 1965-12-06 [id number]  Subjective: Telephone call to patient for every other month call.  Patient reports she is doing fine and recent visit to kidney doctor.  She reports that he changed some of her medications.  Medication review completed.  Patient reports that she sees primary doctor in February. She reports that her sugars are in the 200's fasting.  Discussed with patient continuing to work on her diet in order to bring her blood sugars down below 200 fasting. She verbalized understanding and agrees with goal.  Patient reports she will see eye doctor later this month.    Objective:   Encounter Medications:  Outpatient Encounter Prescriptions as of 03/07/2016  Medication Sig Note  . acetaminophen (TYLENOL) 500 MG tablet Take 1,000 mg by mouth every 6 (six) hours as needed for moderate pain.   Marland Kitchen albuterol (PROVENTIL HFA;VENTOLIN HFA) 108 (90 BASE) MCG/ACT inhaler Inhale 2 puffs into the lungs every 6 (six) hours as needed for wheezing.   Marland Kitchen albuterol (PROVENTIL) (2.5 MG/3ML) 0.083% nebulizer solution Take 3 mLs (2.5 mg total) by nebulization every 6 (six) hours as needed for wheezing or shortness of breath.   Marland Kitchen aspirin EC 81 MG tablet Take 81 mg by mouth daily.  05/28/2015: 2 tablets daily  . atorvastatin (LIPITOR) 20 MG tablet Take 20 mg by mouth daily.    . carvedilol (COREG) 6.25 MG tablet Take 6.25 mg by mouth 2 (two) times daily.    . cholecalciferol (VITAMIN D) 1000 units tablet Take 1,000 Units by mouth daily.   . Fluticasone-Salmeterol (ADVAIR) 100-50 MCG/DOSE AEPB Inhale 1 puff into the lungs every 12 (twelve) hours as needed (for shortness of breath).    . gabapentin (NEURONTIN) 300 MG capsule Take 300 mg by mouth 3 (three) times daily.    . Insulin Isophane & Regular Human (HUMULIN 70/30 KWIKPEN) (70-30) 100 UNIT/ML PEN Inject 62 Units into the skin 2 (two) times daily. 05/11/2015: 35  unit yesterday  . Linagliptin-Metformin HCl (JENTADUETO) 2.5-500 MG TABS Take 1 tablet by mouth daily.   . montelukast (SINGULAIR) 10 MG tablet Take 10 mg by mouth daily.    . nitroGLYCERIN (NITROSTAT) 0.4 MG SL tablet Place 0.4 mg under the tongue every 5 (five) minutes as needed for chest pain.   . pantoprazole (PROTONIX) 20 MG tablet Take 1 tablet (20 mg total) by mouth daily.   Marland Kitchen torsemide (DEMADEX) 20 MG tablet Take 1 tablet (20 mg total) by mouth daily. (Patient taking differently: Take 40 mg by mouth daily. ) 05/28/2015: Patient reports taking 60 mg Daily.  . Alogliptin-Pioglitazone (OSENI) 25-15 MG TABS Take 1 tablet by mouth daily.   Marland Kitchen lisinopril (PRINIVIL,ZESTRIL) 10 MG tablet Take 10 mg by mouth daily.   . metFORMIN (GLUCOPHAGE) 1000 MG tablet Take 1,000 mg by mouth 2 (two) times daily with a meal.   . tetrahydrozoline 0.05 % ophthalmic solution Place 1 drop into both eyes daily as needed. Dry, Itchy Eyes   . traMADol (ULTRAM) 50 MG tablet Take 1 tablet (50 mg total) by mouth every 6 (six) hours as needed. (Patient not taking: Reported on 03/07/2016)    No facility-administered encounter medications on file as of 03/07/2016.     Functional Status:  In your present state of health, do you have any difficulty performing the following activities: 05/28/2015 05/06/2015  Hearing? N N  Vision? N N  Difficulty concentrating or making decisions?  N N  Walking or climbing stairs? N N  Dressing or bathing? N N  Doing errands, shopping? N N  Preparing Food and eating ? N -  Using the Toilet? N -  In the past six months, have you accidently leaked urine? N -  Do you have problems with loss of bowel control? N -  Managing your Medications? N -  Managing your Finances? N -  Housekeeping or managing your Housekeeping? N -  Some recent data might be hidden    Fall/Depression Screening: PHQ 2/9 Scores 03/07/2016 01/11/2016 12/11/2015 10/26/2015 09/28/2015 06/18/2015 05/28/2015  PHQ - 2 Score 0 0 0 0 0  0 0    Assessment: Patient continues to benefit from health coach outreach for disease management and support.    Plan:  [facility] CM Care Plan Problem One   Flowsheet Row Most Recent Value  Care Plan Problem One  Diabetes Knowledge Deficit  Role Documenting the Problem One  [place] for Problem One  Active  THN Long Term Goal (31-90 days)  Patient will decrease A1c by 0.5 points within 90 days.   THN Long Term Goal Start Date  03/07/16 [goal continued]  Interventions for Problem One Long Term Goal  RN Health Coach reeducated with patient complications of diabetes and importance of maintaining blood sugar control.  THN CM Short Term Goal #1 (0-30 days)  Patient will be able to report maintaining low carbohydrate diet within 30 days.   THN CM Short Term Goal #1 Start Date  03/07/16 [goal continued]  Interventions for Short Term Goal #1  RN Health Coach reeducated avoidance of high carbohydrate foods such as potatoes, rice, and pastas.      RN Health Coach will contact patient in the month of March and patient agrees to next outreach.  Jone Baseman, RN, MSN [place] [phone]

## 2016-03-16 DIAGNOSIS — J9601 Acute respiratory failure with hypoxia: Secondary | ICD-10-CM | POA: Diagnosis not present

## 2016-03-16 DIAGNOSIS — J45901 Unspecified asthma with (acute) exacerbation: Secondary | ICD-10-CM | POA: Diagnosis not present

## 2016-03-30 DIAGNOSIS — L110 Acquired keratosis follicularis: Secondary | ICD-10-CM | POA: Diagnosis not present

## 2016-03-30 DIAGNOSIS — E1140 Type 2 diabetes mellitus with diabetic neuropathy, unspecified: Secondary | ICD-10-CM | POA: Diagnosis not present

## 2016-03-30 DIAGNOSIS — L609 Nail disorder, unspecified: Secondary | ICD-10-CM | POA: Diagnosis not present

## 2016-04-01 ENCOUNTER — Other Ambulatory Visit: Payer: Self-pay

## 2016-04-01 NOTE — Patient Outreach (Signed)
[facility]) Care Management  04/01/2016  [name] December 06, 1965 [id number]   Telephone call to patient to follow up on nurse line call.  No answer.  Unable to leave a message.   Plan: RN Health Coach will attempt patient again in the month of February.   Jone Baseman, RN, MSN [place] [phone]

## 2016-04-04 ENCOUNTER — Other Ambulatory Visit: Payer: Self-pay

## 2016-04-04 NOTE — Patient Outreach (Signed)
[facility]) Care Management  04/04/2016  [name] [birth year] [id number]   Telephone call to patient for follow up on 24 hour nurse line call.  Patient reports she feels much better. She says that her mother gave her an enema for constipation and that it eventually helped.  Discussed with patient use of stool softener.  She reports that she used to take fiber powder and she is thinking about going back to that.  Discussed with patient increasing water intake and fiber in her diet to help with constipation.  She verbalized understanding.    Plan: RN Health Coach will contact patient in the month of March and patient agrees to next outreach.   Jone Baseman, RN, MSN [place] [phone]

## 2016-04-05 DIAGNOSIS — Z79899 Other long term (current) drug therapy: Secondary | ICD-10-CM | POA: Diagnosis not present

## 2016-04-05 DIAGNOSIS — I10 Essential (primary) hypertension: Secondary | ICD-10-CM | POA: Diagnosis not present

## 2016-04-05 DIAGNOSIS — N183 Chronic kidney disease, stage 3 (moderate): Secondary | ICD-10-CM | POA: Diagnosis not present

## 2016-04-06 DIAGNOSIS — R809 Proteinuria, unspecified: Secondary | ICD-10-CM | POA: Diagnosis not present

## 2016-04-06 DIAGNOSIS — N183 Chronic kidney disease, stage 3 (moderate): Secondary | ICD-10-CM | POA: Diagnosis not present

## 2016-04-06 DIAGNOSIS — E871 Hypo-osmolality and hyponatremia: Secondary | ICD-10-CM | POA: Diagnosis not present

## 2016-04-06 DIAGNOSIS — E559 Vitamin D deficiency, unspecified: Secondary | ICD-10-CM | POA: Diagnosis not present

## 2016-04-16 DIAGNOSIS — J45901 Unspecified asthma with (acute) exacerbation: Secondary | ICD-10-CM | POA: Diagnosis not present

## 2016-04-16 DIAGNOSIS — J9601 Acute respiratory failure with hypoxia: Secondary | ICD-10-CM | POA: Diagnosis not present

## 2016-04-20 DIAGNOSIS — R809 Proteinuria, unspecified: Secondary | ICD-10-CM | POA: Diagnosis not present

## 2016-04-20 DIAGNOSIS — N183 Chronic kidney disease, stage 3 (moderate): Secondary | ICD-10-CM | POA: Diagnosis not present

## 2016-04-20 DIAGNOSIS — Z79899 Other long term (current) drug therapy: Secondary | ICD-10-CM | POA: Diagnosis not present

## 2016-04-27 DIAGNOSIS — N183 Chronic kidney disease, stage 3 (moderate): Secondary | ICD-10-CM | POA: Diagnosis not present

## 2016-04-27 DIAGNOSIS — I10 Essential (primary) hypertension: Secondary | ICD-10-CM | POA: Diagnosis not present

## 2016-04-27 DIAGNOSIS — E1165 Type 2 diabetes mellitus with hyperglycemia: Secondary | ICD-10-CM | POA: Diagnosis not present

## 2016-04-29 ENCOUNTER — Other Ambulatory Visit: Payer: Self-pay

## 2016-04-29 NOTE — Patient Outreach (Signed)
[facility]) Care Management  [place]  04/29/2016   [name] April 11, 1965 [id number]  Subjective: Telephone call to patient for every other month call.  Patient reports she is doing good.  She reports that her constipation is being managed better.  Patient reports that she drinks coffee to help with her bowels.  Patient reports seeing the physician on yesterday.  Medication review reflect changes.  Patient recently placed on victoza.  Patient goes for follow up on 05-20-16.  Patient reports that her sugar last check was 250.  Stressed with patient the importance of diet and portion control to help with sugars.  Also discussed how victoza may help as well.  She verbalized understanding.   Objective:   Encounter Medications:  Outpatient Encounter Prescriptions as of 04/29/2016  Medication Sig Note  . acetaminophen (TYLENOL) 500 MG tablet Take 1,000 mg by mouth every 6 (six) hours as needed for moderate pain.   Marland Kitchen albuterol (PROVENTIL HFA;VENTOLIN HFA) 108 (90 BASE) MCG/ACT inhaler Inhale 2 puffs into the lungs every 6 (six) hours as needed for wheezing.   Marland Kitchen albuterol (PROVENTIL) (2.5 MG/3ML) 0.083% nebulizer solution Take 3 mLs (2.5 mg total) by nebulization every 6 (six) hours as needed for wheezing or shortness of breath.   Marland Kitchen aspirin EC 81 MG tablet Take 81 mg by mouth daily.  05/28/2015: 2 tablets daily  . atorvastatin (LIPITOR) 20 MG tablet Take 20 mg by mouth daily.    . carvedilol (COREG) 6.25 MG tablet Take 6.25 mg by mouth 2 (two) times daily.    . cholecalciferol (VITAMIN D) 1000 units tablet Take 1,000 Units by mouth daily.   . Fluticasone-Salmeterol (ADVAIR) 100-50 MCG/DOSE AEPB Inhale 1 puff into the lungs every 12 (twelve) hours as needed (for shortness of breath).    . gabapentin (NEURONTIN) 300 MG capsule Take 300 mg by mouth 3 (three) times daily.    . Insulin Isophane & Regular Human (HUMULIN 70/30 KWIKPEN) (70-30) 100 UNIT/ML PEN Inject 62 Units  into the skin 2 (two) times daily. 04/29/2016: 62 units twice a day  . Linagliptin-Metformin HCl (JENTADUETO) 2.5-500 MG TABS Take 1 tablet by mouth daily.   Marland Kitchen lisinopril (PRINIVIL,ZESTRIL) 10 MG tablet Take 10 mg by mouth daily.   . montelukast (SINGULAIR) 10 MG tablet Take 10 mg by mouth daily.    . nitroGLYCERIN (NITROSTAT) 0.4 MG SL tablet Place 0.4 mg under the tongue every 5 (five) minutes as needed for chest pain.   . pantoprazole (PROTONIX) 20 MG tablet Take 1 tablet (20 mg total) by mouth daily.   Marland Kitchen tetrahydrozoline 0.05 % ophthalmic solution Place 1 drop into both eyes daily as needed. Dry, Itchy Eyes   . torsemide (DEMADEX) 20 MG tablet Take 1 tablet (20 mg total) by mouth daily. (Patient taking differently: Take 40 mg by mouth daily. ) 05/28/2015: Patient reports taking 60 mg Daily.  . Alogliptin-Pioglitazone (OSENI) 25-15 MG TABS Take 1 tablet by mouth daily. 04/29/2016: Patient not taking due to medicaid not paying  . metFORMIN (GLUCOPHAGE) 1000 MG tablet Take 1,000 mg by mouth 2 (two) times daily with a meal. 04/29/2016: Patient reports on hold  . traMADol (ULTRAM) 50 MG tablet Take 1 tablet (50 mg total) by mouth every 6 (six) hours as needed. (Patient not taking: Reported on 03/07/2016)    No facility-administered encounter medications on file as of 04/29/2016.     Functional Status:  In your present state of health, do you have any  difficulty performing the following activities: 05/28/2015 05/06/2015  Hearing? N N  Vision? N N  Difficulty concentrating or making decisions? N N  Walking or climbing stairs? N N  Dressing or bathing? N N  Doing errands, shopping? N N  Preparing Food and eating ? N -  Using the Toilet? N -  In the past six months, have you accidently leaked urine? N -  Do you have problems with loss of bowel control? N -  Managing your Medications? N -  Managing your Finances? N -  Housekeeping or managing your Housekeeping? N -  Some recent data might be hidden     Fall/Depression Screening: PHQ 2/9 Scores 04/29/2016 03/07/2016 01/11/2016 12/11/2015 10/26/2015 09/28/2015 06/18/2015  PHQ - 2 Score 0 0 0 0 0 0 0    Assessment: Patient continues to benefit from health coach outreach for disease management and support.   Plan:  [facility] CM Care Plan Problem One   Flowsheet Row Most Recent Value  Care Plan Problem One  Diabetes Knowledge Deficit  Role Documenting the Problem One  [place] for Problem One  Active  THN Long Term Goal (31-90 days)  Patient will decrease A1c by 0.5 points within 90 days.   THN Long Term Goal Start Date  04/29/16 [goal continued]  Interventions for Problem One Long Term Goal  RN Health Coach reiterated with patient complications of diabetes and importance of maintaining blood sugar control.  THN CM Short Term Goal #1 (0-30 days)  Patient will be able to report maintaining low carbohydrate diet within 30 days.   THN CM Short Term Goal #1 Start Date  03/07/16 [goal continued]  [facility] CM Short Term Goal #1 Met Date  04/29/16  Interventions for Short Term Goal #1  Patient continues to work on continuing low carbohydrate diet.      [facility] CM Care Plan Problem Two   Flowsheet Row Most Recent Value  Care Plan Problem Two  Constipation  Role Documenting the Problem Two  [place] for Problem Two  Active  THN CM Short Term Goal #1 (0-30 days)  Patient will report control of constipation within 30 days.    THN CM Short Term Goal #1 Start Date  04/04/16  [facility] CM Short Term Goal #1 Met Date   04/29/16  Interventions for Short Term Goal #2   Patient reports constipation under control.      RN Health Coach will contact patient in the month of May and patient agrees to next outreach.  Jone Baseman, RN, MSN [place] [phone]

## 2016-05-10 DIAGNOSIS — E1165 Type 2 diabetes mellitus with hyperglycemia: Secondary | ICD-10-CM | POA: Diagnosis not present

## 2016-05-10 DIAGNOSIS — I10 Essential (primary) hypertension: Secondary | ICD-10-CM | POA: Diagnosis not present

## 2016-05-10 DIAGNOSIS — N183 Chronic kidney disease, stage 3 (moderate): Secondary | ICD-10-CM | POA: Diagnosis not present

## 2016-05-12 DIAGNOSIS — R809 Proteinuria, unspecified: Secondary | ICD-10-CM | POA: Diagnosis not present

## 2016-05-12 DIAGNOSIS — Z79899 Other long term (current) drug therapy: Secondary | ICD-10-CM | POA: Diagnosis not present

## 2016-05-12 DIAGNOSIS — E559 Vitamin D deficiency, unspecified: Secondary | ICD-10-CM | POA: Diagnosis not present

## 2016-05-12 DIAGNOSIS — I10 Essential (primary) hypertension: Secondary | ICD-10-CM | POA: Diagnosis not present

## 2016-05-12 DIAGNOSIS — N183 Chronic kidney disease, stage 3 (moderate): Secondary | ICD-10-CM | POA: Diagnosis not present

## 2016-05-13 DIAGNOSIS — I10 Essential (primary) hypertension: Secondary | ICD-10-CM | POA: Diagnosis not present

## 2016-05-13 DIAGNOSIS — N179 Acute kidney failure, unspecified: Secondary | ICD-10-CM | POA: Diagnosis not present

## 2016-05-13 DIAGNOSIS — N189 Chronic kidney disease, unspecified: Secondary | ICD-10-CM | POA: Diagnosis not present

## 2016-05-14 DIAGNOSIS — J9601 Acute respiratory failure with hypoxia: Secondary | ICD-10-CM | POA: Diagnosis not present

## 2016-05-14 DIAGNOSIS — J45901 Unspecified asthma with (acute) exacerbation: Secondary | ICD-10-CM | POA: Diagnosis not present

## 2016-05-25 DIAGNOSIS — E785 Hyperlipidemia, unspecified: Secondary | ICD-10-CM | POA: Diagnosis not present

## 2016-05-25 DIAGNOSIS — E1022 Type 1 diabetes mellitus with diabetic chronic kidney disease: Secondary | ICD-10-CM | POA: Diagnosis not present

## 2016-05-25 DIAGNOSIS — E1165 Type 2 diabetes mellitus with hyperglycemia: Secondary | ICD-10-CM | POA: Diagnosis not present

## 2016-05-25 DIAGNOSIS — E11649 Type 2 diabetes mellitus with hypoglycemia without coma: Secondary | ICD-10-CM | POA: Diagnosis not present

## 2016-06-07 ENCOUNTER — Encounter: Payer: Self-pay | Admitting: Nutrition

## 2016-06-07 ENCOUNTER — Encounter: Payer: Medicare Other | Attending: Family Medicine | Admitting: Nutrition

## 2016-06-07 VITALS — Ht 60.5 in | Wt 353.0 lb

## 2016-06-07 DIAGNOSIS — IMO0002 Reserved for concepts with insufficient information to code with codable children: Secondary | ICD-10-CM

## 2016-06-07 DIAGNOSIS — Z794 Long term (current) use of insulin: Secondary | ICD-10-CM | POA: Diagnosis not present

## 2016-06-07 DIAGNOSIS — E119 Type 2 diabetes mellitus without complications: Secondary | ICD-10-CM | POA: Diagnosis not present

## 2016-06-07 DIAGNOSIS — E1165 Type 2 diabetes mellitus with hyperglycemia: Secondary | ICD-10-CM

## 2016-06-07 DIAGNOSIS — E118 Type 2 diabetes mellitus with unspecified complications: Secondary | ICD-10-CM

## 2016-06-07 DIAGNOSIS — E669 Obesity, unspecified: Secondary | ICD-10-CM | POA: Insufficient documentation

## 2016-06-07 DIAGNOSIS — Z6841 Body Mass Index (BMI) 40.0 and over, adult: Secondary | ICD-10-CM | POA: Diagnosis not present

## 2016-06-07 DIAGNOSIS — Z9114 Patient's other noncompliance with medication regimen: Secondary | ICD-10-CM | POA: Diagnosis not present

## 2016-06-07 NOTE — Patient Instructions (Signed)
Goals 1. Follow My Plate 2. Eat 2-3 carb choies per meal 3. Eat 2 low carb vegetables with lunch and dinner daily. 4. Walk 15 minutes a day or more. 5. Cut out snacks, sweets, chips, cookies and junk food 6. No snacks between meals unless veggies. 7. Do not skip meals 8. Take insulin before breakfast and before dinner daily. Get A1C down to 7% Lose 5 lbs per month

## 2016-06-07 NOTE — Progress Notes (Signed)
Diabetes Self-Management Education  Visit Type: First/Initial  Appt. Start Time: 930 Appt. End Time: 1100  06/07/2016  Ms. [name], identified by name and date of birth, is a 51 y.o. female with a diagnosis of Diabetes: Type 2. LIves by herself. Has an aide. Eats at her moms a lot. Doesn't take insulin if her BS is low. Non compliant with medications and diet. Willing to make changes with eating habits and meds to improve her DM. Recommended referral to Dr. Dorris Fetch, Endocrinologist to assist with improving her DM and obesity.  ASSESSMENT  Height 5' 0.5" (1.537 m), weight (!) 353 lb (160.1 kg), last menstrual period 09/11/2013. Body mass index is 67.81 kg/m.      Diabetes Self-Management Education - 06/07/16 0940      Visit Information   Visit Type First/Initial     Initial Visit   Diabetes Type Type 2   Are you currently following a meal plan? No   Are you taking your medications as prescribed? Yes   Date Diagnosed 1990     Health Coping   How would you rate your overall health? Fair     Psychosocial Assessment   Patient Belief/Attitude about Diabetes Motivated to manage diabetes   Self-care barriers Impaired vision   Other persons present Patient;Other (comment)  Aide   Patient Concerns Nutrition/Meal planning;Medication;Monitoring;Healthy Lifestyle;Problem Solving;Glycemic Control   Special Needs Simplified materials;Verbal instruction   Preferred Learning Style No preference indicated   Learning Readiness Ready   How often do you need to have someone help you when you read instructions, pamphlets, or other written materials from your doctor or pharmacy? 4 - Often   What is the last grade level you completed in school? 12     Pre-Education Assessment   Patient understands the diabetes disease and treatment process. Needs Instruction   Patient understands incorporating nutritional management into lifestyle. Needs Instruction   Patient undertands incorporating  physical activity into lifestyle. Needs Instruction   Patient understands using medications safely. Needs Instruction   Patient understands monitoring blood glucose, interpreting and using results Needs Instruction   Patient understands prevention, detection, and treatment of acute complications. Needs Instruction   Patient understands prevention, detection, and treatment of chronic complications. Needs Instruction   Patient understands how to develop strategies to address psychosocial issues. Needs Instruction   Patient understands how to develop strategies to promote health/change behavior. Needs Instruction     Complications   Last HgB A1C per patient/outside source 14 %   How often do you check your blood sugar? 1-2 times/day   Fasting Blood glucose range (mg/dL) >200   Postprandial Blood glucose range (mg/dL) >200   Number of hypoglycemic episodes per month 4   Number of hyperglycemic episodes per week 25   Can you tell when your blood sugar is high? Yes   What do you do if your blood sugar is high? sometimes takes extra insulin   Have you had a dilated eye exam in the past 12 months? Yes   Have you had a dental exam in the past 12 months? Yes   Are you checking your feet? No     Dietary Intake   Breakfast Oatmeal  and crackers-3, water   Snack (morning) fruit, cheese its   Lunch Ribs 2, water   Snack (afternoon) fruit or yogurt   Dinner Pig feet 2,  black eyed peas and cabbage, water   Snack (evening) Sherbet ice cream 1/2 c   Beverage(s) water, SF  kooiaid     Exercise   Exercise Type ADL's   How many days per week to you exercise? 0     Patient Education   Disease state  Definition of diabetes, type 1 and 2, and the diagnosis of diabetes   Nutrition management  Role of diet in the treatment of diabetes and the relationship between the three main macronutrients and blood glucose level;Carbohydrate counting;Reviewed blood glucose goals for pre and post meals and how to  evaluate the patients' food intake on their blood glucose level.;Meal timing in regards to the patients' current diabetes medication.;Meal options for control of blood glucose level and chronic complications.   Physical activity and exercise  Role of exercise on diabetes management, blood pressure control and cardiac health.;Identified with patient nutritional and/or medication changes necessary with exercise.;Helped patient identify appropriate exercises in relation to his/her diabetes, diabetes complications and other health issue.   Medications Taught/reviewed insulin injection, site rotation, insulin storage and needle disposal.;Reviewed patients medication for diabetes, action, purpose, timing of dose and side effects.;Reviewed medication adjustment guidelines for hyperglycemia and sick days.   Monitoring Taught/evaluated SMBG meter.;Purpose and frequency of SMBG.;Taught/discussed recording of test results and interpretation of SMBG.;Interpreting lab values - A1C, lipid, urine microalbumina.;Identified appropriate SMBG and/or A1C goals.;Yearly dilated eye exam;Daily foot exams   Acute complications Taught treatment of hypoglycemia - the 15 rule.;Discussed and identified patients' treatment of hyperglycemia.   Chronic complications Relationship between chronic complications and blood glucose control;Assessed and discussed foot care and prevention of foot problems;Lipid levels, blood glucose control and heart disease;Identified and discussed with patient  current chronic complications;Dental care;Retinopathy and reason for yearly dilated eye exams;Nephropathy, what it is, prevention of, the use of ACE, ARB's and early detection of through urine microalbumia.;Reviewed with patient heart disease, higher risk of, and prevention   Psychosocial adjustment Worked with patient to identify barriers to care and solutions;Helped patient identify a support system for diabetes management;Identified and addressed  patients feelings and concerns about diabetes;Brainstormed with patient on coping mechanisms for social situations, getting support from significant others, dealing with feelings about diabetes   Personal strategies to promote health Lifestyle issues that need to be addressed for better diabetes care     Individualized Goals (developed by patient)   Nutrition Follow meal plan discussed;General guidelines for healthy choices and portions discussed;Adjust meds/carbs with exercise as discussed   Physical Activity Exercise 3-5 times per week;15 minutes per day   Medications take my medication as prescribed   Monitoring  test my blood glucose as discussed;test blood glucose pre and post meals as discussed   Reducing Risk do foot checks daily;treat hypoglycemia with 15 grams of carbs if blood glucose less than 70mg /dL     Post-Education Assessment   Patient understands the diabetes disease and treatment process. Needs Review   Patient understands incorporating nutritional management into lifestyle. Needs Review   Patient undertands incorporating physical activity into lifestyle. Needs Review   Patient understands using medications safely. Needs Review   Patient understands monitoring blood glucose, interpreting and using results Needs Review   Patient understands prevention, detection, and treatment of acute complications. Needs Review   Patient understands prevention, detection, and treatment of chronic complications. Needs Review   Patient understands how to develop strategies to address psychosocial issues. Needs Review   Patient understands how to develop strategies to promote health/change behavior. Needs Review     Outcomes   Expected Outcomes Demonstrated interest in learning. Expect positive outcomes   Future DMSE  2 wks      Individualized Plan for Diabetes Self-Management Training:   Learning Objective:  Patient will have a greater understanding of diabetes self-management. Patient  education plan is to attend individual and/or group sessions per assessed needs and concerns.   Plan:   Patient Instructions  Goals 1. Follow My Plate 2. Eat 2-3 carb choies per meal 3. Eat 2 low carb vegetables with lunch and dinner daily. 4. Walk 15 minutes a day or more. 5. Cut out snacks, sweets, chips, cookies and junk food 6. No snacks between meals unless veggies. 7. Do not skip meals 8. Take insulin before breakfast and before dinner daily. Get A1C down to 7% Lose 5 lbs per month    Expected Outcomes:  Demonstrated interest in learning. Expect positive outcomes  Education material provided: Living Well with Diabetes, Food label handouts, A1C conversion sheet, Meal plan card, My Plate and Carbohydrate counting sheet  If problems or questions, patient to contact team via:  Phone and Email  Future DSME appointment: 2 wks

## 2016-06-14 DIAGNOSIS — J45901 Unspecified asthma with (acute) exacerbation: Secondary | ICD-10-CM | POA: Diagnosis not present

## 2016-06-14 DIAGNOSIS — J9601 Acute respiratory failure with hypoxia: Secondary | ICD-10-CM | POA: Diagnosis not present

## 2016-06-15 DIAGNOSIS — L110 Acquired keratosis follicularis: Secondary | ICD-10-CM | POA: Diagnosis not present

## 2016-06-15 DIAGNOSIS — E1140 Type 2 diabetes mellitus with diabetic neuropathy, unspecified: Secondary | ICD-10-CM | POA: Diagnosis not present

## 2016-06-15 DIAGNOSIS — L609 Nail disorder, unspecified: Secondary | ICD-10-CM | POA: Diagnosis not present

## 2016-06-21 ENCOUNTER — Ambulatory Visit: Payer: Medicare Other | Admitting: Nutrition

## 2016-06-21 ENCOUNTER — Telehealth: Payer: Self-pay | Admitting: Nutrition

## 2016-06-21 NOTE — Telephone Encounter (Signed)
Left message with family member to have her call and reschedule missed appt.

## 2016-07-01 ENCOUNTER — Other Ambulatory Visit: Payer: Self-pay

## 2016-07-01 NOTE — Patient Outreach (Signed)
[place] [facility]) Care Management  07/01/2016  KEIRAN SIAS 09/06/1965 [id number]    Telephone call to patient for monthly call.  No answer.  States voicemail full.    Plan: RN Health Coach will attempt patient again in the month of May.    Jone Baseman, RN, MSN [place] [phone]

## 2016-07-14 DIAGNOSIS — J9601 Acute respiratory failure with hypoxia: Secondary | ICD-10-CM | POA: Diagnosis not present

## 2016-07-14 DIAGNOSIS — J45901 Unspecified asthma with (acute) exacerbation: Secondary | ICD-10-CM | POA: Diagnosis not present

## 2016-07-15 DIAGNOSIS — D509 Iron deficiency anemia, unspecified: Secondary | ICD-10-CM | POA: Diagnosis not present

## 2016-07-15 DIAGNOSIS — N183 Chronic kidney disease, stage 3 (moderate): Secondary | ICD-10-CM | POA: Diagnosis not present

## 2016-07-15 DIAGNOSIS — R809 Proteinuria, unspecified: Secondary | ICD-10-CM | POA: Diagnosis not present

## 2016-07-15 DIAGNOSIS — Z79899 Other long term (current) drug therapy: Secondary | ICD-10-CM | POA: Diagnosis not present

## 2016-07-15 DIAGNOSIS — I10 Essential (primary) hypertension: Secondary | ICD-10-CM | POA: Diagnosis not present

## 2016-07-19 ENCOUNTER — Other Ambulatory Visit: Payer: Self-pay

## 2016-07-19 NOTE — Patient Outreach (Signed)
[facility]) Care Management  07/19/2016  [name] 24-Jan-1966 [id number]   Telephone call to patient for every other month call. Mother states she is not home. Advised health coach will attempt another time.  Plan: RN Health Coach will attempt patient again in the month of May.    Jone Baseman, RN, MSN [place] [phone]

## 2016-07-19 NOTE — Patient Outreach (Signed)
[facility]) Care Management  07/19/2016  [name] Sep 12, 1965 [id number]

## 2016-07-20 DIAGNOSIS — E870 Hyperosmolality and hypernatremia: Secondary | ICD-10-CM | POA: Diagnosis not present

## 2016-07-20 DIAGNOSIS — R809 Proteinuria, unspecified: Secondary | ICD-10-CM | POA: Diagnosis not present

## 2016-07-20 DIAGNOSIS — N2581 Secondary hyperparathyroidism of renal origin: Secondary | ICD-10-CM | POA: Diagnosis not present

## 2016-07-20 DIAGNOSIS — N183 Chronic kidney disease, stage 3 (moderate): Secondary | ICD-10-CM | POA: Diagnosis not present

## 2016-07-20 NOTE — Telephone Encounter (Signed)
This encounter was created in error - please disregard.

## 2016-07-22 ENCOUNTER — Other Ambulatory Visit: Payer: Self-pay

## 2016-07-22 NOTE — Patient Outreach (Signed)
[place] [facility]) Care Management  07/22/2016  MCKINZEE SPIRITO [birth year] [id number]   Telephone call to patient for monthly call.  No answer.  States VM full.    Plan: RN Health Coach will attempt patient again within the next 30 days.    Jone Baseman, RN, MSN [place] [phone]

## 2016-08-02 ENCOUNTER — Other Ambulatory Visit: Payer: Self-pay

## 2016-08-02 NOTE — Patient Outreach (Signed)
[place] [facility]) Care Management  [place]  08/02/2016   [name] [birth year] [id number]  Subjective: Telephone call to patient for outreach call.  Patient reports she is doing ok.  She reports that she has a cold.  Discussed with patient cold symptoms and when to notify physician.  She verbalized understanding. Patient has not checked her sugar today and could not give me a reading.  She reports that they are better.  Discussed with patient the importance of maintaining diabetic diet and trying to lose weight. She verbalized understanding.    Objective:   Encounter Medications:  Outpatient Encounter Prescriptions as of 08/02/2016  Medication Sig Note  . acetaminophen (TYLENOL) 500 MG tablet Take 1,000 mg by mouth every 6 (six) hours as needed for moderate pain.   Marland Kitchen albuterol (PROVENTIL HFA;VENTOLIN HFA) 108 (90 BASE) MCG/ACT inhaler Inhale 2 puffs into the lungs every 6 (six) hours as needed for wheezing.   Marland Kitchen albuterol (PROVENTIL) (2.5 MG/3ML) 0.083% nebulizer solution Take 3 mLs (2.5 mg total) by nebulization every 6 (six) hours as needed for wheezing or shortness of breath.   Marland Kitchen aspirin EC 81 MG tablet Take 81 mg by mouth daily.  05/28/2015: 2 tablets daily  . atorvastatin (LIPITOR) 20 MG tablet Take 20 mg by mouth daily.    . calcium carbonate (OSCAL) 1500 (600 Ca) MG TABS tablet Take 600 mg of elemental calcium by mouth 2 (two) times daily with a meal.   . carvedilol (COREG) 6.25 MG tablet Take 6.25 mg by mouth 2 (two) times daily.    . cholecalciferol (VITAMIN D) 1000 units tablet Take 1,000 Units by mouth daily.   . Fluticasone-Salmeterol (ADVAIR) 100-50 MCG/DOSE AEPB Inhale 1 puff into the lungs every 12 (twelve) hours as needed (for shortness of breath).    . gabapentin (NEURONTIN) 300 MG capsule Take 300 mg by mouth 3 (three) times daily.    . Insulin Isophane & Regular Human (HUMULIN 70/30 KWIKPEN) (70-30) 100 UNIT/ML PEN Inject 62 Units into the skin 2  (two) times daily. 04/29/2016: 62 units twice a day  . liraglutide (VICTOZA) 18 MG/3ML SOPN Inject 2 mg into the skin daily.   . montelukast (SINGULAIR) 10 MG tablet Take 10 mg by mouth daily.    . nitroGLYCERIN (NITROSTAT) 0.4 MG SL tablet Place 0.4 mg under the tongue every 5 (five) minutes as needed for chest pain.   Marland Kitchen tetrahydrozoline 0.05 % ophthalmic solution Place 1 drop into both eyes daily as needed. Dry, Itchy Eyes   . torsemide (DEMADEX) 20 MG tablet Take 1 tablet (20 mg total) by mouth daily. (Patient taking differently: Take 40 mg by mouth daily. ) 05/28/2015: Patient reports taking 60 mg Daily.  . Alogliptin-Pioglitazone (OSENI) 25-15 MG TABS Take 1 tablet by mouth daily. 04/29/2016: Patient not taking due to medicaid not paying  . Linagliptin-Metformin HCl (JENTADUETO) 2.5-500 MG TABS Take 1 tablet by mouth daily. 08/02/2016: MD discontinued  . lisinopril (PRINIVIL,ZESTRIL) 10 MG tablet Take 10 mg by mouth daily.   . metFORMIN (GLUCOPHAGE) 1000 MG tablet Take 1,000 mg by mouth 2 (two) times daily with a meal. 04/29/2016: Patient reports on hold  . pantoprazole (PROTONIX) 20 MG tablet Take 1 tablet (20 mg total) by mouth daily. (Patient not taking: Reported on 06/07/2016)   . traMADol (ULTRAM) 50 MG tablet Take 1 tablet (50 mg total) by mouth every 6 (six) hours as needed. (Patient not taking: Reported on 03/07/2016)    No facility-administered  encounter medications on file as of 08/02/2016.     Functional Status:  No flowsheet data found.  Fall/Depression Screening: Fall Risk  08/02/2016 06/07/2016 04/29/2016  Falls in the past year? No Yes No  Risk for fall due to : - Impaired balance/gait;Impaired mobility -   PHQ 2/9 Scores 08/02/2016 06/07/2016 04/29/2016 03/07/2016 01/11/2016 12/11/2015 10/26/2015  PHQ - 2 Score 0 0 0 0 0 0 0    Assessment: Patient continues to benefit from health coach outreach for disease management and support.    Plan:  [facility] CM Care Plan Problem One     Most Recent Value   Care Plan Problem One  Diabetes Knowledge Deficit  Role Documenting the Problem One  Health Coach  Care Plan for Problem One  Active  THN Long Term Goal   Patient will decrease A1c by 0.5 points within 90 days.   THN Long Term Goal Start Date  08/02/16 [goal continued]  Interventions for Problem One Long Term Goal  RN Health Coach reviewed with patient complications of diabetes and importance of maintaining blood sugar control.    THN CM Care Plan Problem Two     Most Recent Value  Care Plan Problem Two  Constipation  Role Documenting the Problem Two  [place] for Problem Two  Active  THN CM Short Term Goal #1   Patient will report control of constipation within 30 days.    THN CM Short Term Goal #1 Start Date  04/04/16  [facility] CM Short Term Goal #1 Met Date   04/29/16  Interventions for Short Term Goal #2   Patient reports constipation under control.      RN Health Coach will contact patient in the month of July and patient agrees to next outreach.  Jone Baseman, RN, MSN [place] [phone]

## 2016-08-08 DIAGNOSIS — N183 Chronic kidney disease, stage 3 (moderate): Secondary | ICD-10-CM | POA: Diagnosis not present

## 2016-08-08 DIAGNOSIS — I10 Essential (primary) hypertension: Secondary | ICD-10-CM | POA: Diagnosis not present

## 2016-08-08 DIAGNOSIS — Z79899 Other long term (current) drug therapy: Secondary | ICD-10-CM | POA: Diagnosis not present

## 2016-08-14 DIAGNOSIS — J9601 Acute respiratory failure with hypoxia: Secondary | ICD-10-CM | POA: Diagnosis not present

## 2016-08-14 DIAGNOSIS — J45901 Unspecified asthma with (acute) exacerbation: Secondary | ICD-10-CM | POA: Diagnosis not present

## 2016-09-05 ENCOUNTER — Other Ambulatory Visit: Payer: Self-pay

## 2016-09-05 NOTE — Patient Outreach (Signed)
[place] [facility]) Care Management  09/05/2016  [name] 1965/11/05 [id number]   Telephone call to patient for every other month call.  No answer.  Unable to leave a message.  Plan: RN Health Coach will attempt patient again in the month of July.  Jone Baseman, RN, MSN [place] [phone]

## 2016-09-07 DIAGNOSIS — I10 Essential (primary) hypertension: Secondary | ICD-10-CM | POA: Diagnosis not present

## 2016-09-07 DIAGNOSIS — N183 Chronic kidney disease, stage 3 (moderate): Secondary | ICD-10-CM | POA: Diagnosis not present

## 2016-09-07 DIAGNOSIS — E1165 Type 2 diabetes mellitus with hyperglycemia: Secondary | ICD-10-CM | POA: Diagnosis not present

## 2016-09-12 DIAGNOSIS — E118 Type 2 diabetes mellitus with unspecified complications: Secondary | ICD-10-CM | POA: Diagnosis not present

## 2016-09-13 DIAGNOSIS — J45901 Unspecified asthma with (acute) exacerbation: Secondary | ICD-10-CM | POA: Diagnosis not present

## 2016-09-13 DIAGNOSIS — J9601 Acute respiratory failure with hypoxia: Secondary | ICD-10-CM | POA: Diagnosis not present

## 2016-09-14 ENCOUNTER — Other Ambulatory Visit: Payer: Self-pay

## 2016-09-14 NOTE — Patient Outreach (Signed)
[facility] [facility]) Care Management  [place]  09/14/2016   [name] [birth year] [id number]  Subjective: Telephone call to patient for every other month call.  Patient reports she is doing ok and that she saw her primary care physician last Wednesday.  Medications updated.  Patient reports she does not have the results from her A1c yet from the doctor.  Patient however reports that her blood sugar had been 400 after drinking Gatorade. She reports that the doctor advised her not to drink those anymore.  Discussed with patient blood sugar and controlling her blood sugars.  She verbalized understanding.    Objective:   Encounter Medications:  Outpatient Encounter Prescriptions as of 09/14/2016  Medication Sig Note  . acetaminophen (TYLENOL) 500 MG tablet Take 1,000 mg by mouth every 6 (six) hours as needed for moderate pain.   Marland Kitchen albuterol (PROVENTIL HFA;VENTOLIN HFA) 108 (90 BASE) MCG/ACT inhaler Inhale 2 puffs into the lungs every 6 (six) hours as needed for wheezing.   Marland Kitchen albuterol (PROVENTIL) (2.5 MG/3ML) 0.083% nebulizer solution Take 3 mLs (2.5 mg total) by nebulization every 6 (six) hours as needed for wheezing or shortness of breath.   Marland Kitchen aspirin EC 81 MG tablet Take 81 mg by mouth daily.  05/28/2015: 2 tablets daily  . atorvastatin (LIPITOR) 20 MG tablet Take 20 mg by mouth daily.    . calcium carbonate (OSCAL) 1500 (600 Ca) MG TABS tablet Take 600 mg of elemental calcium by mouth 2 (two) times daily with a meal.   . carvedilol (COREG) 6.25 MG tablet Take 6.25 mg by mouth 2 (two) times daily.    . cholecalciferol (VITAMIN D) 1000 units tablet Take 1,000 Units by mouth daily.   . Fluticasone-Salmeterol (ADVAIR) 100-50 MCG/DOSE AEPB Inhale 1 puff into the lungs every 12 (twelve) hours as needed (for shortness of breath).    . gabapentin (NEURONTIN) 300 MG capsule Take 300 mg by mouth 3 (three) times daily.    . Insulin Isophane & Regular Human (HUMULIN 70/30  KWIKPEN) (70-30) 100 UNIT/ML PEN Inject 62 Units into the skin 2 (two) times daily. 04/29/2016: 62 units twice a day  . liraglutide (VICTOZA) 18 MG/3ML SOPN Inject 2 mg into the skin daily. 09/14/2016: 1.8 mg daily  . lisinopril (PRINIVIL,ZESTRIL) 10 MG tablet Take 10 mg by mouth daily.   . montelukast (SINGULAIR) 10 MG tablet Take 10 mg by mouth daily.    . nitroGLYCERIN (NITROSTAT) 0.4 MG SL tablet Place 0.4 mg under the tongue every 5 (five) minutes as needed for chest pain.   Marland Kitchen tetrahydrozoline 0.05 % ophthalmic solution Place 1 drop into both eyes daily as needed. Dry, Itchy Eyes   . torsemide (DEMADEX) 20 MG tablet Take 1 tablet (20 mg total) by mouth daily. (Patient taking differently: Take 40 mg by mouth daily. ) 05/28/2015: Patient reports taking 60 mg Daily.  . Alogliptin-Pioglitazone (OSENI) 25-15 MG TABS Take 1 tablet by mouth daily. 04/29/2016: Patient not taking due to medicaid not paying  . Linagliptin-Metformin HCl (JENTADUETO) 2.5-500 MG TABS Take 1 tablet by mouth daily. 08/02/2016: MD discontinued  . metFORMIN (GLUCOPHAGE) 1000 MG tablet Take 1,000 mg by mouth 2 (two) times daily with a meal. 04/29/2016: Patient reports on hold  . pantoprazole (PROTONIX) 20 MG tablet Take 1 tablet (20 mg total) by mouth daily. (Patient not taking: Reported on 06/07/2016)   . traMADol (ULTRAM) 50 MG tablet Take 1 tablet (50 mg total) by mouth every 6 (six) hours  as needed. (Patient not taking: Reported on 03/07/2016)    No facility-administered encounter medications on file as of 09/14/2016.     Functional Status:  No flowsheet data found.  Fall/Depression Screening: Fall Risk  09/14/2016 08/02/2016 06/07/2016  Falls in the past year? No No Yes  Risk for fall due to : - - Impaired balance/gait;Impaired mobility   PHQ 2/9 Scores 09/14/2016 08/02/2016 06/07/2016 04/29/2016 03/07/2016 01/11/2016 12/11/2015  PHQ - 2 Score 0 0 0 0 0 0 0    Assessment: Patient continues to benefit from health coach outreach for disease  management and support.    Plan:  [facility] CM Care Plan Problem One     Most Recent Value  Care Plan Problem One  Diabetes Knowledge Deficit  Role Documenting the Problem One  Health Coach  Care Plan for Problem One  Active  THN Long Term Goal   Patient will decrease A1c by 0.5 points within 90 days.   THN Long Term Goal Start Date  09/14/16 [goal continued]  Interventions for Problem One Long Term Goal  RN Health Coach discussed with patient complications of diabetes and importance of maintaining blood sugar control.     RN Health Coach will contact patient in the month of September and patient agrees to next outreach.  Jone Baseman, RN, MSN [place] [phone]

## 2016-10-06 DIAGNOSIS — L609 Nail disorder, unspecified: Secondary | ICD-10-CM | POA: Diagnosis not present

## 2016-10-06 DIAGNOSIS — L110 Acquired keratosis follicularis: Secondary | ICD-10-CM | POA: Diagnosis not present

## 2016-10-06 DIAGNOSIS — E1140 Type 2 diabetes mellitus with diabetic neuropathy, unspecified: Secondary | ICD-10-CM | POA: Diagnosis not present

## 2016-10-12 DIAGNOSIS — E1165 Type 2 diabetes mellitus with hyperglycemia: Secondary | ICD-10-CM | POA: Diagnosis not present

## 2016-10-12 DIAGNOSIS — I10 Essential (primary) hypertension: Secondary | ICD-10-CM | POA: Diagnosis not present

## 2016-10-14 DIAGNOSIS — H3582 Retinal ischemia: Secondary | ICD-10-CM | POA: Diagnosis not present

## 2016-10-14 DIAGNOSIS — J9601 Acute respiratory failure with hypoxia: Secondary | ICD-10-CM | POA: Diagnosis not present

## 2016-10-14 DIAGNOSIS — H35372 Puckering of macula, left eye: Secondary | ICD-10-CM | POA: Diagnosis not present

## 2016-10-14 DIAGNOSIS — E103593 Type 1 diabetes mellitus with proliferative diabetic retinopathy without macular edema, bilateral: Secondary | ICD-10-CM | POA: Diagnosis not present

## 2016-10-14 DIAGNOSIS — J45901 Unspecified asthma with (acute) exacerbation: Secondary | ICD-10-CM | POA: Diagnosis not present

## 2016-10-28 DIAGNOSIS — Z79899 Other long term (current) drug therapy: Secondary | ICD-10-CM | POA: Diagnosis not present

## 2016-10-28 DIAGNOSIS — R809 Proteinuria, unspecified: Secondary | ICD-10-CM | POA: Diagnosis not present

## 2016-10-28 DIAGNOSIS — D509 Iron deficiency anemia, unspecified: Secondary | ICD-10-CM | POA: Diagnosis not present

## 2016-10-28 DIAGNOSIS — N183 Chronic kidney disease, stage 3 (moderate): Secondary | ICD-10-CM | POA: Diagnosis not present

## 2016-10-28 DIAGNOSIS — E559 Vitamin D deficiency, unspecified: Secondary | ICD-10-CM | POA: Diagnosis not present

## 2016-10-28 DIAGNOSIS — I10 Essential (primary) hypertension: Secondary | ICD-10-CM | POA: Diagnosis not present

## 2016-11-01 ENCOUNTER — Other Ambulatory Visit: Payer: Self-pay

## 2016-11-01 NOTE — Patient Outreach (Signed)
[facility] [facility]) Care Management  [place]  11/01/2016   [name] 09/04/1965 [id number]  Subjective: Telephone call to patient for every other month call.  Patient reports she is doing good.  She reports that her sugars are better and her A1c was better but does not remember what her A1c was.  Patient reports last blood last check was 120.  Discussed with patient importance of limiting sugary drinks and reading labels of food and drink.  She verbalized understanding.    Objective:   Encounter Medications:  Outpatient Encounter Prescriptions as of 11/01/2016  Medication Sig Note  . acetaminophen (TYLENOL) 500 MG tablet Take 1,000 mg by mouth every 6 (six) hours as needed for moderate pain.   Marland Kitchen albuterol (PROVENTIL HFA;VENTOLIN HFA) 108 (90 BASE) MCG/ACT inhaler Inhale 2 puffs into the lungs every 6 (six) hours as needed for wheezing.   Marland Kitchen albuterol (PROVENTIL) (2.5 MG/3ML) 0.083% nebulizer solution Take 3 mLs (2.5 mg total) by nebulization every 6 (six) hours as needed for wheezing or shortness of breath.   Marland Kitchen aspirin EC 81 MG tablet Take 81 mg by mouth daily.  05/28/2015: 2 tablets daily  . atorvastatin (LIPITOR) 20 MG tablet Take 20 mg by mouth daily.    . calcium carbonate (OSCAL) 1500 (600 Ca) MG TABS tablet Take 600 mg of elemental calcium by mouth 2 (two) times daily with a meal.   . carvedilol (COREG) 6.25 MG tablet Take 6.25 mg by mouth 2 (two) times daily.    . cholecalciferol (VITAMIN D) 1000 units tablet Take 1,000 Units by mouth daily.   . Fluticasone-Salmeterol (ADVAIR) 100-50 MCG/DOSE AEPB Inhale 1 puff into the lungs every 12 (twelve) hours as needed (for shortness of breath).    . gabapentin (NEURONTIN) 300 MG capsule Take 300 mg by mouth 3 (three) times daily.    . Insulin Isophane & Regular Human (HUMULIN 70/30 KWIKPEN) (70-30) 100 UNIT/ML PEN Inject 62 Units into the skin 2 (two) times daily. 04/29/2016: 62 units twice a day  . liraglutide  (VICTOZA) 18 MG/3ML SOPN Inject 2 mg into the skin daily. 09/14/2016: 1.8 mg daily  . lisinopril (PRINIVIL,ZESTRIL) 10 MG tablet Take 10 mg by mouth daily.   . montelukast (SINGULAIR) 10 MG tablet Take 10 mg by mouth daily.    . nitroGLYCERIN (NITROSTAT) 0.4 MG SL tablet Place 0.4 mg under the tongue every 5 (five) minutes as needed for chest pain.   Marland Kitchen tetrahydrozoline 0.05 % ophthalmic solution Place 1 drop into both eyes daily as needed. Dry, Itchy Eyes   . torsemide (DEMADEX) 20 MG tablet Take 1 tablet (20 mg total) by mouth daily. (Patient taking differently: Take 40 mg by mouth daily. ) 05/28/2015: Patient reports taking 60 mg Daily.  . traMADol (ULTRAM) 50 MG tablet Take 1 tablet (50 mg total) by mouth every 6 (six) hours as needed.   . Alogliptin-Pioglitazone (OSENI) 25-15 MG TABS Take 1 tablet by mouth daily. 04/29/2016: Patient not taking due to medicaid not paying  . Linagliptin-Metformin HCl (JENTADUETO) 2.5-500 MG TABS Take 1 tablet by mouth daily. 08/02/2016: MD discontinued  . metFORMIN (GLUCOPHAGE) 1000 MG tablet Take 1,000 mg by mouth 2 (two) times daily with a meal. 04/29/2016: Patient reports on hold  . pantoprazole (PROTONIX) 20 MG tablet Take 1 tablet (20 mg total) by mouth daily. (Patient not taking: Reported on 06/07/2016)    No facility-administered encounter medications on file as of 11/01/2016.     Functional Status:  No flowsheet data found.  Fall/Depression Screening: Fall Risk  11/01/2016 09/14/2016 08/02/2016  Falls in the past year? No No No  Risk for fall due to : - - -   PHQ 2/9 Scores 11/01/2016 09/14/2016 08/02/2016 06/07/2016 04/29/2016 03/07/2016 01/11/2016  PHQ - 2 Score 0 0 0 0 0 0 0    Assessment: Patient continues to benefit from health coach outreach for disease management and support.    Plan:  [facility] CM Care Plan Problem One     Most Recent Value  Care Plan Problem One  Diabetes Knowledge Deficit  Role Documenting the Problem One  Health Coach  Care Plan for Problem  One  Active  THN Long Term Goal   Patient will decrease A1c by 0.5 points within 90 days.   THN Long Term Goal Start Date  11/01/16 [goal continued]  Interventions for Problem One Long Term Goal  RN Health Coach reviewed with patient complications of diabetes and importance of maintaining blood sugar control.     RN Health Coach will contact patient in the month of November and patient agrees to next outreach.  Jone Baseman, RN, MSN [place] [phone]

## 2016-11-02 DIAGNOSIS — N183 Chronic kidney disease, stage 3 (moderate): Secondary | ICD-10-CM | POA: Diagnosis not present

## 2016-11-02 DIAGNOSIS — I10 Essential (primary) hypertension: Secondary | ICD-10-CM | POA: Diagnosis not present

## 2016-11-02 DIAGNOSIS — D638 Anemia in other chronic diseases classified elsewhere: Secondary | ICD-10-CM | POA: Diagnosis not present

## 2016-11-10 DIAGNOSIS — I10 Essential (primary) hypertension: Secondary | ICD-10-CM | POA: Diagnosis not present

## 2016-11-10 DIAGNOSIS — E1365 Other specified diabetes mellitus with hyperglycemia: Secondary | ICD-10-CM | POA: Diagnosis not present

## 2016-11-10 DIAGNOSIS — R0609 Other forms of dyspnea: Secondary | ICD-10-CM | POA: Diagnosis not present

## 2016-11-10 DIAGNOSIS — N184 Chronic kidney disease, stage 4 (severe): Secondary | ICD-10-CM | POA: Diagnosis not present

## 2016-11-10 DIAGNOSIS — E1322 Other specified diabetes mellitus with diabetic chronic kidney disease: Secondary | ICD-10-CM | POA: Diagnosis not present

## 2016-11-14 DIAGNOSIS — J9601 Acute respiratory failure with hypoxia: Secondary | ICD-10-CM | POA: Diagnosis not present

## 2016-11-14 DIAGNOSIS — J45901 Unspecified asthma with (acute) exacerbation: Secondary | ICD-10-CM | POA: Diagnosis not present

## 2016-12-14 DIAGNOSIS — J45901 Unspecified asthma with (acute) exacerbation: Secondary | ICD-10-CM | POA: Diagnosis not present

## 2016-12-14 DIAGNOSIS — J9601 Acute respiratory failure with hypoxia: Secondary | ICD-10-CM | POA: Diagnosis not present

## 2016-12-19 DIAGNOSIS — Z79899 Other long term (current) drug therapy: Secondary | ICD-10-CM | POA: Diagnosis not present

## 2016-12-19 DIAGNOSIS — E559 Vitamin D deficiency, unspecified: Secondary | ICD-10-CM | POA: Diagnosis not present

## 2016-12-19 DIAGNOSIS — D509 Iron deficiency anemia, unspecified: Secondary | ICD-10-CM | POA: Diagnosis not present

## 2016-12-19 DIAGNOSIS — R809 Proteinuria, unspecified: Secondary | ICD-10-CM | POA: Diagnosis not present

## 2016-12-19 DIAGNOSIS — N183 Chronic kidney disease, stage 3 (moderate): Secondary | ICD-10-CM | POA: Diagnosis not present

## 2016-12-19 DIAGNOSIS — I10 Essential (primary) hypertension: Secondary | ICD-10-CM | POA: Diagnosis not present

## 2016-12-21 DIAGNOSIS — N183 Chronic kidney disease, stage 3 (moderate): Secondary | ICD-10-CM | POA: Diagnosis not present

## 2016-12-21 DIAGNOSIS — D638 Anemia in other chronic diseases classified elsewhere: Secondary | ICD-10-CM | POA: Diagnosis not present

## 2016-12-21 DIAGNOSIS — R809 Proteinuria, unspecified: Secondary | ICD-10-CM | POA: Diagnosis not present

## 2016-12-26 DIAGNOSIS — N183 Chronic kidney disease, stage 3 (moderate): Secondary | ICD-10-CM | POA: Diagnosis not present

## 2016-12-26 DIAGNOSIS — I10 Essential (primary) hypertension: Secondary | ICD-10-CM | POA: Diagnosis not present

## 2016-12-26 DIAGNOSIS — E118 Type 2 diabetes mellitus with unspecified complications: Secondary | ICD-10-CM | POA: Diagnosis not present

## 2016-12-26 DIAGNOSIS — Z23 Encounter for immunization: Secondary | ICD-10-CM | POA: Diagnosis not present

## 2016-12-28 DIAGNOSIS — E1140 Type 2 diabetes mellitus with diabetic neuropathy, unspecified: Secondary | ICD-10-CM | POA: Diagnosis not present

## 2016-12-28 DIAGNOSIS — L609 Nail disorder, unspecified: Secondary | ICD-10-CM | POA: Diagnosis not present

## 2016-12-28 DIAGNOSIS — L110 Acquired keratosis follicularis: Secondary | ICD-10-CM | POA: Diagnosis not present

## 2016-12-29 DIAGNOSIS — E1322 Other specified diabetes mellitus with diabetic chronic kidney disease: Secondary | ICD-10-CM | POA: Diagnosis not present

## 2016-12-29 DIAGNOSIS — E1365 Other specified diabetes mellitus with hyperglycemia: Secondary | ICD-10-CM | POA: Diagnosis not present

## 2016-12-29 DIAGNOSIS — R0609 Other forms of dyspnea: Secondary | ICD-10-CM | POA: Diagnosis not present

## 2016-12-29 DIAGNOSIS — N184 Chronic kidney disease, stage 4 (severe): Secondary | ICD-10-CM | POA: Diagnosis not present

## 2017-01-03 ENCOUNTER — Other Ambulatory Visit: Payer: Self-pay

## 2017-01-03 NOTE — Patient Outreach (Signed)
[place] [facility]) Care Management  [place]  01/03/2017   [name] Aug 11, 1965 [id number]  Subjective: Telephone call to patient for call. Patient reports she is doing good and that her sugars are good. Patient reports that her sugar check at the physician last week was 124 but unable to tell me her A1c. Patient reports she is still exercising and that the physician wants her to get the lap band surgery. Patient in the process of completing paperwork.  Discussed with patient continuing to watch her diet.  She verbalized understanding. Discussed with patient case closure next month as she has ben doing good and no hospitalizations.  She verbalized understanding.   Objective:   Encounter Medications:  Outpatient Encounter Medications as of 01/03/2017  Medication Sig Note  . acetaminophen (TYLENOL) 500 MG tablet Take 1,000 mg by mouth every 6 (six) hours as needed for moderate pain.   Marland Kitchen albuterol (PROVENTIL HFA;VENTOLIN HFA) 108 (90 BASE) MCG/ACT inhaler Inhale 2 puffs into the lungs every 6 (six) hours as needed for wheezing.   Marland Kitchen albuterol (PROVENTIL) (2.5 MG/3ML) 0.083% nebulizer solution Take 3 mLs (2.5 mg total) by nebulization every 6 (six) hours as needed for wheezing or shortness of breath.   Marland Kitchen aspirin EC 81 MG tablet Take 81 mg by mouth daily.  05/28/2015: 2 tablets daily  . atorvastatin (LIPITOR) 20 MG tablet Take 20 mg by mouth daily.    . calcium carbonate (OSCAL) 1500 (600 Ca) MG TABS tablet Take 600 mg of elemental calcium by mouth 2 (two) times daily with a meal.   . carvedilol (COREG) 6.25 MG tablet Take 6.25 mg by mouth 2 (two) times daily.    . cholecalciferol (VITAMIN D) 1000 units tablet Take 1,000 Units by mouth daily.   . Fluticasone-Salmeterol (ADVAIR) 100-50 MCG/DOSE AEPB Inhale 1 puff into the lungs every 12 (twelve) hours as needed (for shortness of breath).    . gabapentin (NEURONTIN) 300 MG capsule Take 300 mg by mouth 3 (three) times  daily.    . Insulin Isophane & Regular Human (HUMULIN 70/30 KWIKPEN) (70-30) 100 UNIT/ML PEN Inject 62 Units into the skin 2 (two) times daily. 04/29/2016: 62 units twice a day  . liraglutide (VICTOZA) 18 MG/3ML SOPN Inject 2 mg into the skin daily. 09/14/2016: 1.8 mg daily  . lisinopril (PRINIVIL,ZESTRIL) 10 MG tablet Take 10 mg by mouth daily.   . montelukast (SINGULAIR) 10 MG tablet Take 10 mg by mouth daily.    . nitroGLYCERIN (NITROSTAT) 0.4 MG SL tablet Place 0.4 mg under the tongue every 5 (five) minutes as needed for chest pain.   Marland Kitchen tetrahydrozoline 0.05 % ophthalmic solution Place 1 drop into both eyes daily as needed. Dry, Itchy Eyes   . torsemide (DEMADEX) 20 MG tablet Take 1 tablet (20 mg total) by mouth daily. (Patient taking differently: Take 40 mg by mouth daily. ) 05/28/2015: Patient reports taking 60 mg Daily.  . traMADol (ULTRAM) 50 MG tablet Take 1 tablet (50 mg total) by mouth every 6 (six) hours as needed.   . Alogliptin-Pioglitazone (OSENI) 25-15 MG TABS Take 1 tablet by mouth daily. 04/29/2016: Patient not taking due to medicaid not paying  . Linagliptin-Metformin HCl (JENTADUETO) 2.5-500 MG TABS Take 1 tablet by mouth daily. 08/02/2016: MD discontinued  . metFORMIN (GLUCOPHAGE) 1000 MG tablet Take 1,000 mg by mouth 2 (two) times daily with a meal. 04/29/2016: Patient reports on hold  . pantoprazole (PROTONIX) 20 MG tablet Take 1 tablet (20  mg total) by mouth daily. (Patient not taking: Reported on 06/07/2016)    No facility-administered encounter medications on file as of 01/03/2017.     Functional Status:  No flowsheet data found.  Fall/Depression Screening: Fall Risk  01/03/2017 11/01/2016 09/14/2016  Falls in the past year? No No No  Risk for fall due to : - - -   PHQ 2/9 Scores 01/03/2017 11/01/2016 09/14/2016 08/02/2016 06/07/2016 04/29/2016 03/07/2016  PHQ - 2 Score 0 0 0 0 0 0 0    Assessment: Patient continues to benefit from care manager outreach for disease management and support.     Plan:  [facility] CM Care Plan Problem One     Most Recent Value  Care Plan Problem One  Diabetes Knowledge Deficit  Role Documenting the Problem One  Health Coach  Care Plan for Problem One  Active  THN Long Term Goal   Patient will decrease A1c by 0.5 points within 90 days.   THN Long Term Goal Start Date  01/03/17 [goal continued]  Interventions for Problem One Long Term Goal  RN Health Coach reiterated with patient complications of diabetes and importance of maintaining blood sugar control.      RN CM will contact patient in the month of December and patient agrees to next outreach.  Jone Baseman, RN, MSN [facility] Care Management Care Management Coordinator Direct Line [phone] Toll Free: [phone]  Fax: [phone]

## 2017-01-14 DIAGNOSIS — J45901 Unspecified asthma with (acute) exacerbation: Secondary | ICD-10-CM | POA: Diagnosis not present

## 2017-01-14 DIAGNOSIS — J9601 Acute respiratory failure with hypoxia: Secondary | ICD-10-CM | POA: Diagnosis not present

## 2017-01-30 ENCOUNTER — Other Ambulatory Visit: Payer: Self-pay

## 2017-01-30 NOTE — Patient Outreach (Signed)
[place] [facility]) Care Management  [place]  01/30/2017   [name] January 27, 1966 [id number]  Subjective: Telephone call to patient for monthly call. Patient reports she is doing good and herr sugar is good. Patient did not give numbers.  Discussed with patient this being final call and her to continue to work on her diabetes.  she verbalized understanding.   Objective:   Encounter Medications:  Outpatient Encounter Medications as of 01/30/2017  Medication Sig Note  . acetaminophen (TYLENOL) 500 MG tablet Take 1,000 mg by mouth every 6 (six) hours as needed for moderate pain.   Marland Kitchen albuterol (PROVENTIL HFA;VENTOLIN HFA) 108 (90 BASE) MCG/ACT inhaler Inhale 2 puffs into the lungs every 6 (six) hours as needed for wheezing.   Marland Kitchen albuterol (PROVENTIL) (2.5 MG/3ML) 0.083% nebulizer solution Take 3 mLs (2.5 mg total) by nebulization every 6 (six) hours as needed for wheezing or shortness of breath.   . Alogliptin-Pioglitazone (OSENI) 25-15 MG TABS Take 1 tablet by mouth daily. 04/29/2016: Patient not taking due to medicaid not paying  . aspirin EC 81 MG tablet Take 81 mg by mouth daily.  05/28/2015: 2 tablets daily  . atorvastatin (LIPITOR) 20 MG tablet Take 20 mg by mouth daily.    . calcium carbonate (OSCAL) 1500 (600 Ca) MG TABS tablet Take 600 mg of elemental calcium by mouth 2 (two) times daily with a meal.   . carvedilol (COREG) 6.25 MG tablet Take 6.25 mg by mouth 2 (two) times daily.    . cholecalciferol (VITAMIN D) 1000 units tablet Take 1,000 Units by mouth daily.   . Fluticasone-Salmeterol (ADVAIR) 100-50 MCG/DOSE AEPB Inhale 1 puff into the lungs every 12 (twelve) hours as needed (for shortness of breath).    . gabapentin (NEURONTIN) 300 MG capsule Take 300 mg by mouth 3 (three) times daily.    . Insulin Isophane & Regular Human (HUMULIN 70/30 KWIKPEN) (70-30) 100 UNIT/ML PEN Inject 62 Units into the skin 2 (two) times daily. 04/29/2016: 62 units twice a day  .  Linagliptin-Metformin HCl (JENTADUETO) 2.5-500 MG TABS Take 1 tablet by mouth daily. 08/02/2016: MD discontinued  . liraglutide (VICTOZA) 18 MG/3ML SOPN Inject 2 mg into the skin daily. 09/14/2016: 1.8 mg daily  . lisinopril (PRINIVIL,ZESTRIL) 10 MG tablet Take 10 mg by mouth daily.   . metFORMIN (GLUCOPHAGE) 1000 MG tablet Take 1,000 mg by mouth 2 (two) times daily with a meal. 04/29/2016: Patient reports on hold  . montelukast (SINGULAIR) 10 MG tablet Take 10 mg by mouth daily.    . nitroGLYCERIN (NITROSTAT) 0.4 MG SL tablet Place 0.4 mg under the tongue every 5 (five) minutes as needed for chest pain.   . pantoprazole (PROTONIX) 20 MG tablet Take 1 tablet (20 mg total) by mouth daily. (Patient not taking: Reported on 06/07/2016)   . tetrahydrozoline 0.05 % ophthalmic solution Place 1 drop into both eyes daily as needed. Dry, Itchy Eyes   . torsemide (DEMADEX) 20 MG tablet Take 1 tablet (20 mg total) by mouth daily. (Patient taking differently: Take 40 mg by mouth daily. ) 05/28/2015: Patient reports taking 60 mg Daily.  . traMADol (ULTRAM) 50 MG tablet Take 1 tablet (50 mg total) by mouth every 6 (six) hours as needed.    No facility-administered encounter medications on file as of 01/30/2017.     Functional Status:  No flowsheet data found.  Fall/Depression Screening: Fall Risk  01/03/2017 11/01/2016 09/14/2016  Falls in the past year? No No No  Risk for fall due to : - - -   PHQ 2/9 Scores 01/03/2017 11/01/2016 09/14/2016 08/02/2016 06/07/2016 04/29/2016 03/07/2016  PHQ - 2 Score 0 0 0 0 0 0 0    Assessment: Patient continues to work towards goal of lowering A1c.  Plan:  [facility] CM Care Plan Problem One     Most Recent Value  Care Plan Problem One  Diabetes Knowledge Deficit  Role Documenting the Problem One  Health Coach  Care Plan for Problem One  Active  THN Long Term Goal   Patient will decrease A1c by 0.5 points within 90 days.   THN Long Term Goal Start Date  01/03/17  THN Long Term Goal Met Date   01/30/17  Interventions for Problem One Long Term Goal  Patient contonues to work at goal.       RN CM will close case as patient has completed goal.   RN CM will notify care management assistant of case status. RN CM will notify physician of case closure.  Jone Baseman, RN, MSN [facility] Care Management Care Management Coordinator Direct Line [phone] Toll Free: [phone]  Fax: [phone]

## 2017-01-31 ENCOUNTER — Ambulatory Visit: Payer: Self-pay

## 2017-02-13 DIAGNOSIS — J9601 Acute respiratory failure with hypoxia: Secondary | ICD-10-CM | POA: Diagnosis not present

## 2017-02-13 DIAGNOSIS — J45901 Unspecified asthma with (acute) exacerbation: Secondary | ICD-10-CM | POA: Diagnosis not present

## 2017-03-16 DIAGNOSIS — J45901 Unspecified asthma with (acute) exacerbation: Secondary | ICD-10-CM | POA: Diagnosis not present

## 2017-03-16 DIAGNOSIS — J9601 Acute respiratory failure with hypoxia: Secondary | ICD-10-CM | POA: Diagnosis not present

## 2017-03-20 DIAGNOSIS — J069 Acute upper respiratory infection, unspecified: Secondary | ICD-10-CM | POA: Diagnosis not present

## 2017-03-20 DIAGNOSIS — N183 Chronic kidney disease, stage 3 (moderate): Secondary | ICD-10-CM | POA: Diagnosis not present

## 2017-03-20 DIAGNOSIS — I10 Essential (primary) hypertension: Secondary | ICD-10-CM | POA: Diagnosis not present

## 2017-03-20 DIAGNOSIS — E1122 Type 2 diabetes mellitus with diabetic chronic kidney disease: Secondary | ICD-10-CM | POA: Diagnosis not present

## 2017-03-25 DIAGNOSIS — E559 Vitamin D deficiency, unspecified: Secondary | ICD-10-CM | POA: Diagnosis not present

## 2017-03-25 DIAGNOSIS — R809 Proteinuria, unspecified: Secondary | ICD-10-CM | POA: Diagnosis not present

## 2017-03-25 DIAGNOSIS — I10 Essential (primary) hypertension: Secondary | ICD-10-CM | POA: Diagnosis not present

## 2017-03-25 DIAGNOSIS — Z79899 Other long term (current) drug therapy: Secondary | ICD-10-CM | POA: Diagnosis not present

## 2017-03-25 DIAGNOSIS — N183 Chronic kidney disease, stage 3 (moderate): Secondary | ICD-10-CM | POA: Diagnosis not present

## 2017-03-25 DIAGNOSIS — D509 Iron deficiency anemia, unspecified: Secondary | ICD-10-CM | POA: Diagnosis not present

## 2017-03-29 DIAGNOSIS — D638 Anemia in other chronic diseases classified elsewhere: Secondary | ICD-10-CM | POA: Diagnosis not present

## 2017-03-29 DIAGNOSIS — N183 Chronic kidney disease, stage 3 (moderate): Secondary | ICD-10-CM | POA: Diagnosis not present

## 2017-03-29 DIAGNOSIS — R809 Proteinuria, unspecified: Secondary | ICD-10-CM | POA: Diagnosis not present

## 2017-03-29 DIAGNOSIS — N2581 Secondary hyperparathyroidism of renal origin: Secondary | ICD-10-CM | POA: Diagnosis not present

## 2017-03-31 DIAGNOSIS — J9601 Acute respiratory failure with hypoxia: Secondary | ICD-10-CM | POA: Diagnosis not present

## 2017-03-31 DIAGNOSIS — J45901 Unspecified asthma with (acute) exacerbation: Secondary | ICD-10-CM | POA: Diagnosis not present

## 2017-04-04 DIAGNOSIS — L110 Acquired keratosis follicularis: Secondary | ICD-10-CM | POA: Diagnosis not present

## 2017-04-04 DIAGNOSIS — E1140 Type 2 diabetes mellitus with diabetic neuropathy, unspecified: Secondary | ICD-10-CM | POA: Diagnosis not present

## 2017-04-04 DIAGNOSIS — L609 Nail disorder, unspecified: Secondary | ICD-10-CM | POA: Diagnosis not present

## 2017-04-06 DIAGNOSIS — H5203 Hypermetropia, bilateral: Secondary | ICD-10-CM | POA: Diagnosis not present

## 2017-04-06 DIAGNOSIS — H52223 Regular astigmatism, bilateral: Secondary | ICD-10-CM | POA: Diagnosis not present

## 2017-04-06 DIAGNOSIS — H524 Presbyopia: Secondary | ICD-10-CM | POA: Diagnosis not present

## 2017-04-14 DIAGNOSIS — R0609 Other forms of dyspnea: Secondary | ICD-10-CM | POA: Diagnosis not present

## 2017-04-14 DIAGNOSIS — E1165 Type 2 diabetes mellitus with hyperglycemia: Secondary | ICD-10-CM | POA: Diagnosis not present

## 2017-04-14 DIAGNOSIS — N184 Chronic kidney disease, stage 4 (severe): Secondary | ICD-10-CM | POA: Diagnosis not present

## 2017-04-14 DIAGNOSIS — E1122 Type 2 diabetes mellitus with diabetic chronic kidney disease: Secondary | ICD-10-CM | POA: Diagnosis not present

## 2017-04-16 DIAGNOSIS — J45901 Unspecified asthma with (acute) exacerbation: Secondary | ICD-10-CM | POA: Diagnosis not present

## 2017-04-16 DIAGNOSIS — J9601 Acute respiratory failure with hypoxia: Secondary | ICD-10-CM | POA: Diagnosis not present

## 2017-04-21 DIAGNOSIS — E103593 Type 1 diabetes mellitus with proliferative diabetic retinopathy without macular edema, bilateral: Secondary | ICD-10-CM | POA: Diagnosis not present

## 2017-04-21 DIAGNOSIS — H35372 Puckering of macula, left eye: Secondary | ICD-10-CM | POA: Diagnosis not present

## 2017-04-21 DIAGNOSIS — H3582 Retinal ischemia: Secondary | ICD-10-CM | POA: Diagnosis not present

## 2017-04-25 DIAGNOSIS — E119 Type 2 diabetes mellitus without complications: Secondary | ICD-10-CM | POA: Diagnosis not present

## 2017-04-25 DIAGNOSIS — I10 Essential (primary) hypertension: Secondary | ICD-10-CM | POA: Diagnosis not present

## 2017-04-25 DIAGNOSIS — I509 Heart failure, unspecified: Secondary | ICD-10-CM | POA: Diagnosis not present

## 2017-04-27 ENCOUNTER — Other Ambulatory Visit (HOSPITAL_COMMUNITY): Payer: Self-pay | Admitting: General Surgery

## 2017-04-27 ENCOUNTER — Telehealth: Payer: Self-pay

## 2017-04-27 NOTE — Telephone Encounter (Signed)
SENT REFERRALS TO SCHEDULING 

## 2017-05-04 ENCOUNTER — Encounter: Payer: Self-pay | Admitting: Registered"

## 2017-05-04 ENCOUNTER — Encounter: Payer: Medicare Other | Attending: General Surgery | Admitting: Registered"

## 2017-05-04 DIAGNOSIS — E7800 Pure hypercholesterolemia, unspecified: Secondary | ICD-10-CM | POA: Insufficient documentation

## 2017-05-04 DIAGNOSIS — R030 Elevated blood-pressure reading, without diagnosis of hypertension: Secondary | ICD-10-CM | POA: Diagnosis not present

## 2017-05-04 DIAGNOSIS — E119 Type 2 diabetes mellitus without complications: Secondary | ICD-10-CM | POA: Diagnosis not present

## 2017-05-04 DIAGNOSIS — Z6841 Body Mass Index (BMI) 40.0 and over, adult: Secondary | ICD-10-CM | POA: Insufficient documentation

## 2017-05-04 DIAGNOSIS — Z713 Dietary counseling and surveillance: Secondary | ICD-10-CM | POA: Diagnosis not present

## 2017-05-04 NOTE — Patient Instructions (Signed)
-   Check BS 4x/day and tracking it.

## 2017-05-04 NOTE — Progress Notes (Signed)
Pre-Op Assessment Visit:  Pre-Operative RYGB Surgery  Medical Nutrition Therapy:  Appt start time: 9:40  End time:  10:35  Patient was seen on 05/04/2017 for Pre-Operative Nutrition Assessment. Assessment and letter of approval faxed to [facility] Bariatric Surgery Program coordinator on 05/04/2017.   Pt expectation of surgery: wants to feel better, reduce medications   Pt expectation of Dietitian: help with learning what needs to learn   Start weight at [facility]: 351.1 BMI: 68.00   Pt arrives 20 min late due to transportation. Pt states she was diagnosed with diabetes at age 32, almost 14 years ago. Pt states she does not drink sodas. Pt states she is supposed to check BS 4x/day but checks 2x/day: FBS (119) and before bed (235-250). Pt states she may have had A1c in Feb 2019 but does not remember the value. Pt states everyone on her dad's side of the family was "heavy set" and uncle was over 700 lbs before passing away. Pt states she goes to [employer] 2-3x/week and has a hot dog there. Pt states she eats when she craves things. Pt states she has cravings for cookies at times and once she eats the cookie, she is fine. 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   Per insurance, pt needs 6 SWL visits prior to surgery. Pt will need Protein Shakes and Vitamin and Mineral Recommendations at next visit, along with the remainder of Pre-op goals education (starting at goal #5).   24 hr Dietary Recall: First Meal: typically skips; sausage biscuit Snack: trail mix Second Meal: sausage biscuit Snack: sometimes popcorn or jello Third Meal: hot dog, 2 containers of sugar-free jello Snack: sometimes jello or trail mix Beverages:2 cups of coffee, sparkling water, water with flavored packs  Encouraged to engage in 75 minutes of moderate physical activity including cardiovascular and weight baring weekly  Handouts given during visit include:  . Pre-Op Goals . Bariatric Surgery Protein  Shakes . Vitamin and Mineral Recommendations  During the appointment today the following Pre-Op Goals were reviewed with the patient: . Track your food and beverage: MyFitness Pal or Barit[employer] App . Make healthy food choices . Limited concentrated sugars and fried foods . Keep fat/sugar in the single digits per serving on         food labels  . Practice CHEWING your food  (aim for 30 chews per bite or until applesauce consistency) . Practice not drinking 15 minutes before, during, and 30 minutes after each meal/snack . Avoid all carbonated beverages  . Avoid/limit caffeinated beverages  . Avoid all sugar-sweetened beverages . Avoid alcohol . Consume 3 meals per day; eat every 3-5 hours . Make a list of non-food related activities . Aim for 64-100 ounces of FLUID daily  . Aim for at least 60-80 grams of PROTEIN daily . Look for a liquid protein source that contain ?15 g protein and ?5 g carbohydrate  (ex: shakes, drinks, shots) . Physical activity is an important part of a healthy lifestyle so keep it moving! - Check BS 4x/day and track it.   Follow diet recommendations listed below Energy and Macronutrient Recommendations: Calories: 1800 Carbohydrate: 200 Protein: 135 Fat: 50  Demonstrated degree of understanding via:  Teach Back   Teaching Method Utilized:  Visual Auditory Hands on  Barriers to learning/adherence to lifestyle change: contemplative stage of change  Patient to call the Nutrition and Diabetes Education Services to enroll in Pre-Op and Post-Op Nutrition Education when surgery date is scheduled.

## 2017-05-10 ENCOUNTER — Ambulatory Visit (HOSPITAL_COMMUNITY)
Admission: RE | Admit: 2017-05-10 | Discharge: 2017-05-10 | Disposition: A | Discharge status: Home / Self Care | Payer: Medicare Other | Source: Ambulatory Visit | Attending: General Surgery | Admitting: General Surgery

## 2017-05-10 ENCOUNTER — Other Ambulatory Visit: Payer: Self-pay

## 2017-05-10 DIAGNOSIS — Z9884 Bariatric surgery status: Secondary | ICD-10-CM | POA: Diagnosis not present

## 2017-05-14 DIAGNOSIS — J45901 Unspecified asthma with (acute) exacerbation: Secondary | ICD-10-CM | POA: Diagnosis not present

## 2017-05-14 DIAGNOSIS — J9601 Acute respiratory failure with hypoxia: Secondary | ICD-10-CM | POA: Diagnosis not present

## 2017-05-17 ENCOUNTER — Encounter: Payer: Self-pay | Admitting: Physician Assistant

## 2017-05-17 ENCOUNTER — Ambulatory Visit (INDEPENDENT_AMBULATORY_CARE_PROVIDER_SITE_OTHER): Payer: Medicare Other | Admitting: Physician Assistant

## 2017-05-17 DIAGNOSIS — Z01818 Encounter for other preprocedural examination: Secondary | ICD-10-CM

## 2017-05-17 NOTE — Progress Notes (Signed)
Cardiology Office Note    Date:  05/17/2017   ID:  [name], DOB 12/10/1965, MRN [id number]  PCP:  Iona Beard, MD  Cardiologist: New, formerly Dr. Einar Gip No chief complaint on file.   History of Present Illness:  [name] is a 52 y.o. female formally seen in 75 by Dr.Ganji.  Patient has history of hypertension, morbid obesity, diastolic dysfunction and diabetes mellitus.  She underwent cardiac catheterization that showed normal coronary arteries and LV function EF 55% And markedly elevated LVEDP.  Patient referred to [place] by Dr. Gurney Maxin from bariatric surgery center for possible gastric bypass vs sleeve surgery.   Once patient arrived she told that she just saw Dr. Einar Gip last week and is a regular patient of his.  She will ask him for cardiac clearance.  Past Medical History:  Diagnosis Date  . ARF (acute renal failure) ([place]) 05/03/2014  . Asthma   . Asthma exacerbation 05/03/2014  . Asthma, severe persistent 05/03/2014  . CHF (congestive heart failure) ([place])   . Diabetes mellitus   . Diabetes mellitus type 2 in obese ([place]) 05/03/2014  . History of echocardiogram 04/2669   LVH, diastolic dysfunction  . Hyperlipidemia   . Hypertension   . Hyponatremia 12/02/2014  . Obesity   . SOB (shortness of breath) 12/22/2014    Past Surgical History:  Procedure Laterality Date  . CARDIAC CATHETERIZATION  03/2013   normal coronary arteries, EF 55%  . CARDIAC CATHETERIZATION  03/2013   normal coronary arteries  . CATARACT EXTRACTION W/PHACO Right 12/10/2012   Procedure: CATARACT EXTRACTION PHACO AND INTRAOCULAR LENS PLACEMENT ([place]);  Surgeon: Tonny Branch, MD;  Location: [facility];  Service: Ophthalmology;  Laterality: Right;  CDE:22..42  . CATARACT EXTRACTION W/PHACO Left 05/11/2015   Procedure: CATARACT EXTRACTION PHACO AND INTRAOCULAR LENS PLACEMENT LEFT EYE CDE=6.54;  Surgeon: Tonny Branch, MD;  Location: [facility];  Service: Ophthalmology;  Laterality: Left;  . EYE  SURGERY Left   . LEFT HEART CATHETERIZATION WITH CORONARY ANGIOGRAM N/A 04/09/2013   Procedure: LEFT HEART CATHETERIZATION WITH CORONARY ANGIOGRAM;  Surgeon: Laverda Page, MD;  Location: [facility] CATH LAB;  Service: Cardiovascular;  Laterality: N/A;  . TRACHEOSTOMY     at age 84 from asthma attack    Current Medications: No outpatient medications have been marked as taking for the 05/17/17 encounter (Appointment) with Imogene Burn, PA-C.     Allergies:   Patient has no known allergies.   Social History   Socioeconomic History  . Marital status: Single    Spouse name: Not on file  . Number of children: Not on file  . Years of education: Not on file  . Highest education level: Not on file  Social Needs  . Financial resource strain: Not on file  . Food insecurity - worry: Never true  . Food insecurity - inability: Never true  . Transportation needs - medical: Not on file  . Transportation needs - non-medical: Not on file  Occupational History  . Not on file  Tobacco Use  . Smoking status: Never Smoker  . Smokeless tobacco: Never Used  Substance and Sexual Activity  . Alcohol use: Yes    Comment: occasional  . Drug use: No  . Sexual activity: Yes    Birth control/protection: None  Other Topics Concern  . Not on file  Social History Narrative  . Not on file     Family History:  The patient family history includes Asthma in her  other; Diabetes in her mother; Hypertension in her other.   ROS:   Please see the history of present illness.    ROS All other systems reviewed and are negative.   PHYSICAL EXAM:   VS:  LMP 09/11/2013   Physical Exam  GEN: Well nourished, well developed, in no acute distress  HEENT: normal  Neck: no JVD, carotid bruits, or masses Cardiac:RRR; no murmurs, rubs, or gallops  Respiratory:  clear to auscultation bilaterally, normal work of breathing GI: soft, nontender, nondistended, + BS Ext: without cyanosis, clubbing, or edema, Good distal  pulses bilaterally MS: no deformity or atrophy  Skin: warm and dry, no rash Neuro:  Alert and Oriented x 3, Strength and sensation are intact Psych: euthymic mood, full affect  Wt Readings from Last 3 Encounters:  05/04/17 (!) 351 lb 1.6 oz (159.3 kg)  06/07/16 (!) 353 lb (160.1 kg)  05/11/15 (!) 348 lb (157.9 kg)      Studies/Labs Reviewed:   EKG:  EKG is not ordered today.  The ekg ordered today demonstrates  Recent Labs: No results found for requested labs within last 8760 hours.   Lipid Panel No results found for: CHOL, TRIG, HDL, CHOLHDL, VLDL, LDLCALC, LDLDIRECT  Additional studies/ records that were reviewed today include:  Cardiac catheterization 03/2013  Hemodynamic data:   Left ventricular pressure was 114/15 with LVEDP of 31 mm mercury. Aortic pressure was 113/70 with a mean of 88 mm mercury. There was no pressure gradient across the aortic valve   Left ventricle: Performed in the RAO projection revealed LVEF of 55%. There was No significant MR. No wall motion abnormality.   Right coronary artery: The vessel is smooth, normal,  Dominant.   Left main coronary artery is large and normal.   Circumflex coronary artery: A large vessel giving origin to a large obtuse marginal 1. It is smooth and normal.    LAD:  LAD gives origin to a large diagonal-1. Normal.   Impression: Normal coronary arteries, markedly elevated LVEDP secondary to diastolic dysfunction and morbid obesity and hypertension and diabetes mellitus.   Recommendation: Evaluation for noncardiac cause of chest pain is indicated. Critical primary prevention strategies indicated. A total of 50 cc of contrast was less for diagnostic angiography    2D echo 2016Study Conclusions  - Left ventricle: The cavity size was normal. Wall thickness was   increased in a pattern of moderate LVH. Systolic function was   vigorous. The estimated ejection fraction was in the range of 70%   to 75%. Wall motion was normal;  there were no regional wall   motion abnormalities. Features are consistent with a pseudonormal   left ventricular filling pattern, with concomitant abnormal   relaxation and increased filling pressure (grade 2 diastolic   dysfunction). - Aortic valve: Poorly visualized. Mildly calcified annulus. There   was no significant regurgitation. - Mitral valve: Calcified annulus. - Left atrium: The atrium was moderately dilated. - Right atrium: The atrium was mildly dilated. Central venous   pressure (est): 15 mm Hg. - Tricuspid valve: There was trivial regurgitation. - Pulmonary arteries: Systolic pressure could not be accurately   estimated. - Pericardium, extracardiac: A small pericardial effusion was   identified circumferential to the heart.  Impressions:  - Study limited by poor acoustic windows. There is moderate LVH   with LVEF 70-75% and grade 2 diastolic dysfunction. Moderate left   atrial enlargement. Mitral annular calcification noted. Aortic   annular calcification noted with poor visualization of  valve   structure. CVP elevated but unable to estimate PASP. Small,   circumferential pericardial effusion noted.  Chest CT 03/24/15  FINDINGS: There is adequate opacification of the pulmonary arteries. There is no pulmonary embolus. The main pulmonary artery, right main pulmonary artery and left main pulmonary arteries are normal in size. The heart size is enlarged. There is no pericardial effusion.   There is a bandlike area of airspace disease in the left mid lung likely reflecting atelectasis. There is no focal consolidation, pleural effusion or pneumothorax.   There is no axillary, hilar, or mediastinal adenopathy.   There is no lytic or blastic osseous lesion.   The visualized portions of the upper abdomen are unremarkable.   Review of the MIP images confirms the above findings.   IMPRESSION: 1. No evidence of pulmonary embolus. 2. No acute cardiopulmonary disease.      Electronically Signed   By: Kathreen Devoid   On: 03/24/2015 13:21     ASSESSMENT:    No diagnosis found.   PLAN:  In order of problems listed above:   Preoperative clearance but patient follows Dr. Einar Gip regularly so will refer her to him for clearance. No charge. Patient not examined.   Medication Adjustments/Labs and Tests Ordered: Current medicines are reviewed at length with the patient today.  Concerns regarding medicines are outlined above.  Medication changes, Labs and Tests ordered today are listed in the Patient Instructions below. There are no Patient Instructions on file for this visit.   Sumner Boast, PA-C  05/17/2017 10:38 AM    [facility] [address], [place], [place]  [address] Phone: [phone]; Fax: [phone]

## 2017-05-18 DIAGNOSIS — Z01818 Encounter for other preprocedural examination: Secondary | ICD-10-CM

## 2017-05-18 HISTORY — DX: Encounter for other preprocedural examination: Z01.818

## 2017-06-08 ENCOUNTER — Encounter: Payer: Self-pay | Admitting: Registered"

## 2017-06-08 ENCOUNTER — Encounter: Payer: Medicare Other | Attending: General Surgery | Admitting: Registered"

## 2017-06-08 DIAGNOSIS — Z713 Dietary counseling and surveillance: Secondary | ICD-10-CM | POA: Insufficient documentation

## 2017-06-08 DIAGNOSIS — E119 Type 2 diabetes mellitus without complications: Secondary | ICD-10-CM

## 2017-06-08 DIAGNOSIS — R030 Elevated blood-pressure reading, without diagnosis of hypertension: Secondary | ICD-10-CM | POA: Diagnosis not present

## 2017-06-08 DIAGNOSIS — Z6841 Body Mass Index (BMI) 40.0 and over, adult: Secondary | ICD-10-CM | POA: Insufficient documentation

## 2017-06-08 DIAGNOSIS — E7800 Pure hypercholesterolemia, unspecified: Secondary | ICD-10-CM | POA: Insufficient documentation

## 2017-06-08 NOTE — Progress Notes (Signed)
RYGB  Appt start time: 9:05 end time: 9:30  Assessment: 1st SWL Appointment.   Start Wt at NDES: 351.1 Wt: 353.1 BMI: 68.39   Pt arrives having gained 2 lbs since previous visit.   Pt states she reduced fried food intake and has been using her air fryer for chicken. Pt states she wants to wean off carbonation. Pt states she is supposed to check BS 4x/day but checks 2x/day: FBS (120) and before bed (235-250).   During today's appt, we went through goals #5-8 Next visit: begin with goal #9  Pt states she was diagnosed with diabetes at age 52, almost 48 years ago. Pt states she does not drink sodas.  Pt states she may have had A1c in Feb 2019 but does not remember the value. Pt states everyone on her dad's side of the family was "heavy set" and uncle was over 700 lbs before passing away. Pt states she goes to [employer] 2[place]3x/week and has a hot dog there. Pt states she eats when she craves things. Pt states she has cravings for cookies at times and once she eats the cookie, she is fine. 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   Per insurance, pt needs 6 SWL visits prior to surgery. Pt will need Protein Shakes and Vitamin and Mineral Recommendations at next visit, along with the remainder of Pre-op goals education (starting at goal #9).  MEDICATIONS: See list   DIETARY INTAKE:  24-hr recall:  B ( AM): boiled eggs  Snk ( AM): dried fruit or sugar-free jello  L ( PM): sausage biscuit Snk ( PM): dried fruit or sugar-free jello  D ( PM): hot dog, potato chips Snk ( PM): sometimes sugar-free jello or trail mix Beverages: 2 cups of coffee, sparkling water, water with flavored packs  Diet to Follow: 1800 calories 200 g carbohydrates 135 g protein 50 g fat  Preferred Learning Style:   No preference indicated   Learning Readiness:   Contemplating  Ready  Change in progress    Nutritional Diagnosis:  [place]-3.3 Overweight/obesity related to past poor dietary habits and physical  inactivity as evidenced by patient w/ planned RYGB surgery following dietary guidelines for continued weight loss.    Intervention:  Nutrition counseling for upcoming Bariatric Surgery.  Goals:  - Aim for 150 minutes of physical activity including cardio and weight bearing every week - Continue to eliminate carbonation intake.  - Aim to decrease coffee intake to 1 cup a day.  - Continue to reduce fried food intake.   Teaching Method Utilized:  Visual Auditory Hands on  Handouts given during visit include:  none  Barriers to learning/adherence to lifestyle change: contemplative stage of change  Demonstrated degree of understanding via:  Teach Back   Monitoring/Evaluation:  Dietary intake, exercise, and body weight in 1 month(s).

## 2017-06-08 NOTE — Patient Instructions (Signed)
-   Continue to eliminate carbonation intake.   - Aim to decrease coffee intake to 1 cup a day.   - Continue to reduce fried food intake.

## 2017-06-14 DIAGNOSIS — J45901 Unspecified asthma with (acute) exacerbation: Secondary | ICD-10-CM | POA: Diagnosis not present

## 2017-06-14 DIAGNOSIS — J9601 Acute respiratory failure with hypoxia: Secondary | ICD-10-CM | POA: Diagnosis not present

## 2017-06-15 ENCOUNTER — Other Ambulatory Visit: Payer: Self-pay

## 2017-06-15 NOTE — Patient Outreach (Signed)
[facility]) Care Management  06/15/2017  [name] October 27, 1965 [id number]   Medication Adherence call to [name]  Patient is showing past due under [place] with patient she said she already pick this medication three days ago it was deliver from [facility] and will not  need it until next month told patient about the 90 days supply she qualify.  [place] Management Direct Dial [phone]  Fax [phone] [email or web address]

## 2017-06-19 DIAGNOSIS — J45909 Unspecified asthma, uncomplicated: Secondary | ICD-10-CM | POA: Diagnosis not present

## 2017-06-19 DIAGNOSIS — E118 Type 2 diabetes mellitus with unspecified complications: Secondary | ICD-10-CM | POA: Diagnosis not present

## 2017-06-21 DIAGNOSIS — E118 Type 2 diabetes mellitus with unspecified complications: Secondary | ICD-10-CM | POA: Diagnosis not present

## 2017-06-21 DIAGNOSIS — Z79899 Other long term (current) drug therapy: Secondary | ICD-10-CM | POA: Diagnosis not present

## 2017-06-21 DIAGNOSIS — R809 Proteinuria, unspecified: Secondary | ICD-10-CM | POA: Diagnosis not present

## 2017-06-21 DIAGNOSIS — D509 Iron deficiency anemia, unspecified: Secondary | ICD-10-CM | POA: Diagnosis not present

## 2017-06-21 DIAGNOSIS — N183 Chronic kidney disease, stage 3 (moderate): Secondary | ICD-10-CM | POA: Diagnosis not present

## 2017-06-21 DIAGNOSIS — I10 Essential (primary) hypertension: Secondary | ICD-10-CM | POA: Diagnosis not present

## 2017-06-23 ENCOUNTER — Encounter: Payer: Self-pay | Admitting: Gastroenterology

## 2017-06-28 DIAGNOSIS — N2581 Secondary hyperparathyroidism of renal origin: Secondary | ICD-10-CM | POA: Diagnosis not present

## 2017-06-28 DIAGNOSIS — N183 Chronic kidney disease, stage 3 (moderate): Secondary | ICD-10-CM | POA: Diagnosis not present

## 2017-06-28 DIAGNOSIS — R809 Proteinuria, unspecified: Secondary | ICD-10-CM | POA: Diagnosis not present

## 2017-06-28 DIAGNOSIS — D649 Anemia, unspecified: Secondary | ICD-10-CM | POA: Diagnosis not present

## 2017-07-04 DIAGNOSIS — L609 Nail disorder, unspecified: Secondary | ICD-10-CM | POA: Diagnosis not present

## 2017-07-04 DIAGNOSIS — L110 Acquired keratosis follicularis: Secondary | ICD-10-CM | POA: Diagnosis not present

## 2017-07-04 DIAGNOSIS — E1140 Type 2 diabetes mellitus with diabetic neuropathy, unspecified: Secondary | ICD-10-CM | POA: Diagnosis not present

## 2017-07-06 DIAGNOSIS — H5213 Myopia, bilateral: Secondary | ICD-10-CM | POA: Diagnosis not present

## 2017-07-07 ENCOUNTER — Encounter: Payer: Self-pay | Admitting: Registered"

## 2017-07-07 ENCOUNTER — Encounter: Payer: Medicare Other | Attending: General Surgery | Admitting: Registered"

## 2017-07-07 DIAGNOSIS — E7800 Pure hypercholesterolemia, unspecified: Secondary | ICD-10-CM | POA: Diagnosis not present

## 2017-07-07 DIAGNOSIS — R030 Elevated blood-pressure reading, without diagnosis of hypertension: Secondary | ICD-10-CM | POA: Insufficient documentation

## 2017-07-07 DIAGNOSIS — Z713 Dietary counseling and surveillance: Secondary | ICD-10-CM | POA: Diagnosis not present

## 2017-07-07 DIAGNOSIS — Z6841 Body Mass Index (BMI) 40.0 and over, adult: Secondary | ICD-10-CM | POA: Diagnosis not present

## 2017-07-07 DIAGNOSIS — E119 Type 2 diabetes mellitus without complications: Secondary | ICD-10-CM

## 2017-07-07 NOTE — Progress Notes (Signed)
RYGB  Appt start time: 9:45 end time: 10:07  Assessment: 2nd SWL Appointment.   Start Wt at NDES: 351.1 Wt: 353.1 BMI: 68.39   Pt arrives having gained 2 lbs since previous visit.   Pt is talkative. Pt states she has not purchased any carbonated water since previous visit. Pt states she drinks more water now. Pt states she has reduced caffeine intake to 1 cup a day. Pt states her cousin told her she needed to eat from 3 oz cups. Pt states she likes watermelon. Pt states she likes to cook with butter and has to have dressing with her vegetables to avoid food being bland.   Pt states she reduced fried food intake and has been using her air fryer for chicken. Pt states she is supposed to check BS 4x/day but checks 2x/day: FBS (120) and before bed (235-250).   During today's appt, we went through goals #9-11 Next visit: begin with goal #12  Pt states she was diagnosed with diabetes at age 52, almost 39 years ago. Pt states she does not drink sodas.  Pt states she may have had A1c in Feb 2019 but does not remember the value. Pt states everyone on her dad's side of the family was "heavy set" and uncle was over 700 lbs before passing away. Pt states she goes to [place] 2[place]3x/week and has a hot dog there. Pt states she eats when she craves things. Pt states she has cravings for cookies at times and once she eats the cookie, she is fine. 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   Per insurance, pt needs 6 SWL visits prior to surgery. Pt will need Protein Shakes and Vitamin and Mineral Recommendations at next visit, along with the remainder of Pre-op goals education (starting at goal #11).  MEDICATIONS: See list   DIETARY INTAKE:  24-hr recall:  B ( AM): boiled eggs  Snk ( AM): dried fruit or sugar-free jello  L ( PM): sausage biscuit Snk ( PM): dried fruit or sugar-free jello  D ( PM): hot dog, potato chips Snk ( PM): sometimes sugar-free jello or trail mix Beverages: 2 cups of coffee,  sparkling water, water with flavored packs  Diet to Follow: 1800 calories 200 g carbohydrates 135 g protein 50 g fat  Preferred Learning Style:   No preference indicated   Learning Readiness:   Contemplating  Ready  Change in progress    Nutritional Diagnosis:  [place]-3.3 Overweight/obesity related to past poor dietary habits and physical inactivity as evidenced by patient w/ planned RYGB surgery following dietary guidelines for continued weight loss.    Intervention:  Nutrition counseling for upcoming Bariatric Surgery. Pt was educated and counseled on the importance of avoid sugar-sweetened beverages and alcohol, as well as eating 3 meals a day. Pt was educated and counseled on what it looks like to eat every 3-5 hours and what that looks like with her daily schedule. We created a schedule together for her to use as a guide. Pt was also educated on well-balanced meals and appropriate snack options.   Goals:  - Aim for 150 minutes of physical activity including cardio and weight bearing every week - Aim to eat every 3 to 5 hours.  - Aim for 3 meals a day.  - Snack options include: string cheese, nuts, side salad, broccoli, cauliflower, fruit with protein, greek yogurt, etc.   Teaching Method Utilized:  Visual Auditory Hands on  Handouts given during visit include:  Pre-op goals  Barriers to learning/adherence to lifestyle change: contemplative stage of change  Demonstrated degree of understanding via:  Teach Back   Monitoring/Evaluation:  Dietary intake, exercise, and body weight in 1 month(s).

## 2017-07-07 NOTE — Patient Instructions (Addendum)
-   Aim to eat every 3 to 5 hours.   - Aim for 3 meals a day.   - Snack options include: string cheese, nuts, side salad, broccoli, cauliflower, fruit with protein, greek yogurt, etc.

## 2017-07-14 DIAGNOSIS — J45901 Unspecified asthma with (acute) exacerbation: Secondary | ICD-10-CM | POA: Diagnosis not present

## 2017-07-14 DIAGNOSIS — J9601 Acute respiratory failure with hypoxia: Secondary | ICD-10-CM | POA: Diagnosis not present

## 2017-07-20 DIAGNOSIS — Z0000 Encounter for general adult medical examination without abnormal findings: Secondary | ICD-10-CM | POA: Diagnosis not present

## 2017-07-26 ENCOUNTER — Other Ambulatory Visit: Payer: Self-pay

## 2017-07-26 NOTE — Patient Outreach (Signed)
[facility]) Care Management  07/26/2017  [name] [birth year] [id number]   Medication Adherence call to Lori : [name] spoke with patient she ask if can call [facility] for more refill she also wants a 90 days supply, Pharmacy said they need a prescription for a 90 days supply : call doctor Darlyne Russian left a message for them to send in a prescription for 90 days supply, [name] will wait until doctor call the prescription in for 90 days supply   [place] Management Direct Dial [phone]  Fax [phone] [email or web address]

## 2017-08-01 ENCOUNTER — Ambulatory Visit (INDEPENDENT_AMBULATORY_CARE_PROVIDER_SITE_OTHER): Payer: Self-pay

## 2017-08-01 DIAGNOSIS — Z1211 Encounter for screening for malignant neoplasm of colon: Secondary | ICD-10-CM

## 2017-08-01 NOTE — Progress Notes (Signed)
Pt needed to check her schedule and will call back to schedule tcs. I will mail instructions to her when she calls back.

## 2017-08-01 NOTE — Progress Notes (Signed)
Gastroenterology Pre-Procedure Review  Request Date:08/01/17 Requesting Physician: Dr.Hill ( no previous tcs)  PATIENT REVIEW QUESTIONS: The patient responded to the following health history questions as indicated:    1. Diabetes Melitis: yes (humulin 70/30 and victoza) 2. Joint replacements in the past 12 months: no 3. Major health problems in the past 3 months: no 4. Has an artificial valve or MVP: no 5. Has a defibrillator: no 6. Has been advised in past to take antibiotics in advance of a procedure like teeth cleaning: no 7. Family history of colon cancer: no  8. Alcohol Use: no 9. History of sleep apnea: no  10. History of coronary artery or other vascular stents placed within the last 12 months: no 11. History of any prior anesthesia complications: no    MEDICATIONS & ALLERGIES:    Patient reports the following regarding taking any blood thinners:   Plavix? no Aspirin? yes (81mg - 2 daily) Coumadin? no Brilinta? no Xarelto? no Eliquis? no Pradaxa? no Savaysa? no Effient? no  Patient confirms/reports the following medications:  Current Outpatient Medications  Medication Sig Dispense Refill  . acetaminophen (TYLENOL) 500 MG tablet Take 1,000 mg by mouth every 6 (six) hours as needed for moderate pain.    Marland Kitchen albuterol (PROVENTIL HFA;VENTOLIN HFA) 108 (90 BASE) MCG/ACT inhaler Inhale 2 puffs into the lungs every 6 (six) hours as needed for wheezing.    Marland Kitchen albuterol (PROVENTIL) (2.5 MG/3ML) 0.083% nebulizer solution Take 3 mLs (2.5 mg total) by nebulization every 6 (six) hours as needed for wheezing or shortness of breath. 75 mL 12  . aspirin EC 81 MG tablet Take 81 mg by mouth daily.     Marland Kitchen atorvastatin (LIPITOR) 20 MG tablet Take 20 mg by mouth daily.     . calcium carbonate (OSCAL) 1500 (600 Ca) MG TABS tablet Take 600 mg of elemental calcium by mouth 2 (two) times daily with a meal.    . carvedilol (COREG) 6.25 MG tablet Take 6.25 mg by mouth 2 (two) times daily.     .  cholecalciferol (VITAMIN D) 1000 units tablet Take 1,000 Units by mouth daily.    . Fluticasone-Salmeterol (ADVAIR) 100-50 MCG/DOSE AEPB Inhale 1 puff into the lungs every 12 (twelve) hours as needed (for shortness of breath).     . gabapentin (NEURONTIN) 300 MG capsule Take 300 mg by mouth 3 (three) times daily.     . Insulin Isophane & Regular Human (HUMULIN 70/30 KWIKPEN) (70-30) 100 UNIT/ML PEN Inject 62 Units into the skin 2 (two) times daily. (Patient taking differently: Inject 64 Units into the skin 2 (two) times daily. ) 15 mL 2  . liraglutide (VICTOZA) 18 MG/3ML SOPN Inject 2 mg into the skin daily.    Marland Kitchen lisinopril (PRINIVIL,ZESTRIL) 2.5 MG tablet TAKE ONE TABLET BY MOUTH ONCE DAILY.    . montelukast (SINGULAIR) 10 MG tablet Take 10 mg by mouth daily.     . nitroGLYCERIN (NITROSTAT) 0.4 MG SL tablet Place 0.4 mg under the tongue every 5 (five) minutes as needed for chest pain.    . pantoprazole (PROTONIX) 20 MG tablet Take 1 tablet (20 mg total) by mouth daily. 30 tablet 0  . tetrahydrozoline 0.05 % ophthalmic solution Place 1 drop into both eyes daily as needed. Dry, Itchy Eyes    . torsemide (DEMADEX) 20 MG tablet Take 1 tablet (20 mg total) by mouth daily. (Patient taking differently: Take 80 mg by mouth daily. ) 30 tablet 1  . Alogliptin-Pioglitazone (OSENI) 25-15 MG  TABS Take 1 tablet by mouth daily.     No current facility-administered medications for this visit.     Patient confirms/reports the following allergies:  No Known Allergies  No orders of the defined types were placed in this encounter.  AUTHORIZATION INFORMATION Primary Insurance: [place] medicaid,  [place] #: [id number] m Pre-Cert / Josem Kaufmann required: no    SCHEDULE INFORMATION: Procedure has been scheduled as follows:  Date: , Time:  Location:   This Gastroenterology Pre-Precedure Review Form is being routed to the following provider(s): Roseanne Kaufman NP

## 2017-08-02 NOTE — Progress Notes (Signed)
No Oseni or Victoza morning of procedure. 1/2 dose insulin evening prior and no insulin morning of procedure.

## 2017-08-07 ENCOUNTER — Ambulatory Visit: Payer: Self-pay | Admitting: Registered"

## 2017-08-09 ENCOUNTER — Encounter: Payer: Self-pay | Admitting: Registered"

## 2017-08-09 ENCOUNTER — Encounter: Payer: Medicare Other | Attending: General Surgery | Admitting: Registered"

## 2017-08-09 DIAGNOSIS — R030 Elevated blood-pressure reading, without diagnosis of hypertension: Secondary | ICD-10-CM | POA: Diagnosis not present

## 2017-08-09 DIAGNOSIS — Z713 Dietary counseling and surveillance: Secondary | ICD-10-CM | POA: Diagnosis not present

## 2017-08-09 DIAGNOSIS — E119 Type 2 diabetes mellitus without complications: Secondary | ICD-10-CM

## 2017-08-09 DIAGNOSIS — E7800 Pure hypercholesterolemia, unspecified: Secondary | ICD-10-CM | POA: Insufficient documentation

## 2017-08-09 DIAGNOSIS — Z6841 Body Mass Index (BMI) 40.0 and over, adult: Secondary | ICD-10-CM | POA: Insufficient documentation

## 2017-08-09 NOTE — Patient Instructions (Addendum)
-   Find 2-3 flavors of liquid protein that you enjoy with at least 15 grams or more of protein and 5 grams or less of carbohydrates.   - Aim to increase physical activity to 30 min, 3 times/week.

## 2017-08-09 NOTE — Progress Notes (Signed)
RY[facility]  Appt start time: 3:35 end time: 4:00  Assessment: 3rd SWL Appointment.   Start Wt at NDES: 351.1 Wt: 344.4 BMI: 66.70   Pt arrives having lost 8.7 lbs from previous visit. Pt states surgeon wants her to lose 25 lbs from 351 lbs.   Pt is talkative. Pt states she has not purchased any carbonated water since previous visit. Pt states she is eating 3 meals a day. Pt states is consuming 64 ounces of fluid a day. During today's appt, we went through remainder of pre-op goals sheet. Pt states she drinks more water now. Pt states she has reduced caffeine intake to 1 cup a day.  Pt states her cousin told her she needed to eat from 3 oz cups. Pt states she likes watermelon. Pt states she likes to cook with butter and has to have dressing with her vegetables to avoid food being bland.   Pt states she reduced fried food intake and has been using her air fryer for chicken. Pt states she is supposed to check BS 4x/day but checks 2x/day: FBS (120) and before bed (235-250).   Pt states she was diagnosed with diabetes at age 52, almost 56 years ago. Pt states she does not drink sodas.  Pt states she may have had A1c in Feb 2019 but does not remember the value. Pt states everyone on her dad's side of the family was "heavy set" and uncle was over 700 lbs before passing away. Pt states [place] to [employer] [place] and has a hot dog there. Pt states she eats when she craves things. Pt states she has cravings for cookies at times and once she eats the cookie, she is fine. 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   Per insurance, pt needs 6 SWL visits prior to surgery. Pt will need Protein Shakes and Vitamin and Mineral Recommendations at next visit  MEDICATIONS: See list   DIETARY INTAKE:  24-hr recall:  B ( AM): boiled eggs  Snk ( AM): dried fruit + almonds or sugar-free jello  L ( PM): sausage biscuit Snk ( PM): dried fruit or sugar-free jello  D ( PM): hot dog, potato chips Snk ( PM):  sometimes sugar-free jello or trail mix Beverages: 1 cup of coffee, water with flavored packs  Diet to Follow: 1800 calories 200 g carbohydrates 135 g protein 50 g fat  Preferred Learning Style:   No preference indicated   Learning Readiness:   Contemplating  Ready  Change in progress    Nutritional Diagnosis:  [place]-3.3 Overweight/obesity related to past poor dietary habits and physical inactivity as evidenced by patient w/ planned RYGB surgery following dietary guidelines for continued weight loss.    Intervention:  Nutrition counseling for upcoming Bariatric Surgery. Pt was educated and counseled on the importance of avoid sugar-sweetened beverages and alcohol, as well as eating 3 meals a day. Pt was educated and counseled on what it looks like to eat every 3-5 hours and what that looks like with her daily schedule. We created a schedule together for her to use as a guide. Pt was also educated on well-balanced meals and appropriate snack options.   Goals:  - Aim for 150 minutes of physical activity including cardio and weight bearing every week - Find 2-3 flavors of liquid protein that you enjoy with at least 15 grams or more of protein and 5 grams or less of carbohydrates.  - Aim to increase physical activity to 30 min, 3 times/week.  Teaching Method Utilized:  Visual Auditory Hands on  Handouts given during visit include:  Pre-op goals  Barriers to learning/adherence to lifestyle change: contemplative stage of change  Demonstrated degree of understanding via:  Teach Back   Monitoring/Evaluation:  Dietary intake, exercise, and body weight in 1 month(s).

## 2017-08-10 MED ORDER — CLENPIQ 10-3.5-12 MG-GM -GM/160ML PO SOLN: 1.0000 | Freq: Once | ORAL | 0 refills | Status: AC

## 2017-08-10 NOTE — Progress Notes (Addendum)
Pt called back and has been scheduled for July 26,2019 at 12:15. Prep has been sent to the pharmacy and instructions mailed to the pt including DM instructions.

## 2017-08-10 NOTE — Patient Instructions (Signed)
CLENPIQ SPLIT PREP INSTRUCTIONS   Patient Name:  LEYDY WORTHEY Date of procedure:  09/22/17 Time to register at [facility] Stay:  11:15 Provider:  Dr. Oneida Alar   Please notify us immediately if you are diabetic, take iron supplements, or if you are on coumadin or any blood thinners.  Please hold the following medications: see instructions below regarding diabetic medications    Note: Do NOT refrigerate or freeze CLENPIQ. CLENPIQ is ready to drink. There is no need to add any other liquid or mix the medicine in the bottle before you start dosing.   09/21/17  1 Day prior to procedure:     CLEAR LIQUIDS ALL DAY--NO SOLID FOODS OR DAIRY PRODUCTS! See list of liquids that are allowed and items that are NOT allowed below.    Diabetic Medication Instructions:  Only take half of your normal insulin dose this evening    You must drink plenty of CLEAR LIQUIDS starting before your bowel prep. It is important to stay adequately hydrated before, during, and after your bowel prep for the prep to work effectively!    At 5:00 PM Begin the prep as follows:    1. Drink one bottle of premixed CLENPIQ right from the bottle. 2. Drink at least five (5) 8-ounce drinks of clear liquids of your choice within the next 5 hours   Continue clear liquids.    09/22/17  Day of Procedure   Diabetic Medication Instructions: do not take any diabetic medications this morning   You may take TYLENOL products. Please continue your regular medications unless we have instructed you otherwise.    5 hours before procedure @ 7:15am:  1. Drink second bottle of premixed CLENPIQ right from the bottle.   2. Drink at least three (3) 8-ounce drinks of clear liquids of your choice within the next 2 hours. You can drink more if needed.   3 hours before your procedure time @ 9:15 am: Stop drinking all liquids, nothing by mouth at this point.  Please note, on the day of your procedure you MUST be accompanied by  an adult who is willing to assume responsibility for you at time of discharge. If you do not have such person with you, your procedure will have to be rescheduled.                                                                                                                     Please leave ALL jewelry at home prior to coming to the hospital for your procedure.   *It is your responsibility to check with your insurance company for the benefits of coverage you have for this procedure. Unfortunately, not all insurance companies have benefits to cover all or part of these types of procedures. It is your responsibility to check your benefits, however we will be glad to assist you with any codes your insurance company may need.   Please note that most insurance companies will not cover a screening colonoscopy for  people under the age of 65  For example, with some insurance companies you may have benefits for a screening colonoscopy, but if polyps are found the diagnosis will change and then you may have a deductible that will need to be met. Please make sure you check your benefits for screening colonoscopy as well as a diagnostic colonoscopy.   CLEAR LIQUIDS: (NO RED or PURPLE) Water  Jello   Apple Juice  White Grape Juice   Kool-Aid Soft drinks  Banana popsicles Sports Drink  Black coffee (No cream or milk) Tea (No cream or milk)  Broth (fat free beef/chicken/vegetable)  Clear liquids allow you to see your fingers on the other side of the glass.  Be sure they are NOT RED or PURPLE in color, cloudy, but CLEAR.  Do Not Eat: Dairy products of any kind Cranberry juice Tomato or V8 Juice  Orange Juice   Grapefruit Juice Red Grape Juice Alcohol   Non-dairy creamer Solid foods like cereal, oatmeal, yogurt, fruits, vegetables, creamed soups, eggs, bread, etc   HELPFUL HINTS TO MAKE DRINKING EASIER: -Trying drinking through a straw. -If you become nauseated, try consuming smaller amounts or stretch  out the time between glasses.  Stop for 30 minutes & slowly start back drinking.  Call our office with any questions or concerns at [phone].  Thank You

## 2017-08-10 NOTE — Addendum Note (Signed)
Addended by: Claudina Lick on: 08/10/2017 12:59 PM   Modules accepted: Orders, SmartSet

## 2017-08-14 DIAGNOSIS — J9601 Acute respiratory failure with hypoxia: Secondary | ICD-10-CM | POA: Diagnosis not present

## 2017-08-14 DIAGNOSIS — J45901 Unspecified asthma with (acute) exacerbation: Secondary | ICD-10-CM | POA: Diagnosis not present

## 2017-09-06 ENCOUNTER — Encounter: Payer: Medicare Other | Attending: General Surgery | Admitting: Registered"

## 2017-09-06 DIAGNOSIS — Z6841 Body Mass Index (BMI) 40.0 and over, adult: Secondary | ICD-10-CM | POA: Insufficient documentation

## 2017-09-06 DIAGNOSIS — Z713 Dietary counseling and surveillance: Secondary | ICD-10-CM | POA: Insufficient documentation

## 2017-09-06 DIAGNOSIS — E7800 Pure hypercholesterolemia, unspecified: Secondary | ICD-10-CM | POA: Insufficient documentation

## 2017-09-06 DIAGNOSIS — R030 Elevated blood-pressure reading, without diagnosis of hypertension: Secondary | ICD-10-CM | POA: Diagnosis not present

## 2017-09-06 DIAGNOSIS — E119 Type 2 diabetes mellitus without complications: Secondary | ICD-10-CM | POA: Diagnosis not present

## 2017-09-06 NOTE — Progress Notes (Signed)
RY[facility]  Appt start time: 11:15 end time: 11:40  Assessment: 4th SWL Appointment.   Start Wt at NDES: 351.1 Wt: 348.8 BMI: 67.56   Pt arrives having gained 4.4 lbs from previous visit. Pt states surgeon wants her to lose 25 lbs from 351 lbs.   Pt states she likes Premier Protein (peaches and cream flavor). Pt states she has increased physical activity this week moving furniture in her home. Pt states she can go to [facility] but needs someone to take her. Pt states she helps take care of niece and nephew sometimes.   Pt is talkative. Pt states she has not purchased any carbonated water since previous visit. Pt states she is eating 3 meals a day. Pt states is consuming 64 ounces of fluid a day. Pt states she drinks more water now. Pt states she has reduced caffeine intake to 1 cup a day.  Pt states her cousin told her she needed to eat from 3 oz cups. Pt states she likes watermelon. Pt states she likes to cook with butter and has to have dressing with her vegetables to avoid food being bland.   Pt states she reduced fried food intake and has been using her air fryer for chicken. Pt states she is supposed to check BS 4x/day but checks 2x/day: FBS (145) and before bed (200). Pt states she has an appt next week and will find out A1c then.   Pt states she was diagnosed with diabetes at age 52, almost 71 years ago. Pt states she does not drink sodas.  Pt states she may have had A1c in Feb 2019 but does not remember the value. Pt states everyone on her dad's side of the family was "heavy set" and uncle was over 700 lbs before passing away. Pt states she goes to [place] 2[place]3x/week and has a hot dog there. Pt states she eats when she craves things. Pt states she has cravings for cookies at times and once she eats the cookie, she is fine. 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   Per insurance, pt needs 6 SWL visits prior to surgery. Pt will need Vitamin and Mineral Recommendations at next  visit  MEDICATIONS: See list   DIETARY INTAKE:  24-hr recall:  B ( AM): boiled eggs  Snk ( AM): dried fruit + almonds or sugar-free jello  L ( PM): sausage biscuit Snk ( PM): dried fruit or sugar-free jello  D ( PM): hot dog, potato chips Snk ( PM): sometimes sugar-free jello or trail mix Beverages: 1 cup of coffee, water with flavored packs  Diet to Follow: 1800 calories 200 g carbohydrates 135 g protein 50 g fat  Preferred Learning Style:   No preference indicated   Learning Readiness:   Contemplating  Ready  Change in progress    Nutritional Diagnosis:  [place]-3.3 Overweight/obesity related to past poor dietary habits and physical inactivity as evidenced by patient w/ planned RYGB surgery following dietary guidelines for continued weight loss.    Intervention:  Nutrition counseling for upcoming Bariatric Surgery. Pt was educated and counseled on appropriate Protein Shakes for bariatric surgery. Pt was in agreement with goals listed.  Goals:  - Aim for 150 minutes of physical activity including cardio and weight bearing every week - Find at least 1-2 more liquid protein options that you may like. Use Protein Shakes handout as guide.  - Aim to increase physical activity to 30 min, 3 times/week.  Teaching Method Utilized:  Visual Auditory Hands  on  Handouts given during visit include:  Protein Shakes  Barriers to learning/adherence to lifestyle change: contemplative stage of change  Demonstrated degree of understanding via:  Teach Back   Monitoring/Evaluation:  Dietary intake, exercise, and body weight in 1 month(s).

## 2017-09-06 NOTE — Patient Instructions (Addendum)
-   Find at least 1-2 more liquid protein options that you may like. Use Protein Shakes handout as guide.   - Aim to increase physical activity to 30 min, 3 times/week.

## 2017-09-13 DIAGNOSIS — J9601 Acute respiratory failure with hypoxia: Secondary | ICD-10-CM | POA: Diagnosis not present

## 2017-09-13 DIAGNOSIS — J45901 Unspecified asthma with (acute) exacerbation: Secondary | ICD-10-CM | POA: Diagnosis not present

## 2017-09-18 DIAGNOSIS — E785 Hyperlipidemia, unspecified: Secondary | ICD-10-CM | POA: Diagnosis not present

## 2017-09-18 DIAGNOSIS — I10 Essential (primary) hypertension: Secondary | ICD-10-CM | POA: Diagnosis not present

## 2017-09-18 DIAGNOSIS — E1122 Type 2 diabetes mellitus with diabetic chronic kidney disease: Secondary | ICD-10-CM | POA: Diagnosis not present

## 2017-09-18 DIAGNOSIS — N183 Chronic kidney disease, stage 3 (moderate): Secondary | ICD-10-CM | POA: Diagnosis not present

## 2017-09-18 DIAGNOSIS — J45909 Unspecified asthma, uncomplicated: Secondary | ICD-10-CM | POA: Diagnosis not present

## 2017-09-21 ENCOUNTER — Telehealth: Payer: Self-pay

## 2017-09-21 DIAGNOSIS — E161 Other hypoglycemia: Secondary | ICD-10-CM | POA: Diagnosis not present

## 2017-09-21 NOTE — Telephone Encounter (Signed)
Noted. Pt notified.  

## 2017-09-21 NOTE — Telephone Encounter (Signed)
Ok to HAVE TCS JUL 26.

## 2017-09-21 NOTE — Telephone Encounter (Signed)
Pt called to say she forgot and ate oatmeal with blue berries this morning about 10:30, but she did not have milk in the oatmeal.  She has been on clear liquids since and I reviewed her instructions with her.  Dr. Oneida Alar, is it OK to proceed with procedure tomorrow morning?

## 2017-09-22 ENCOUNTER — Encounter (HOSPITAL_COMMUNITY): Payer: Self-pay

## 2017-09-22 ENCOUNTER — Encounter (HOSPITAL_COMMUNITY)
Admission: RE | Disposition: A | Discharge status: Home / Self Care | Payer: Self-pay | Source: Ambulatory Visit | Attending: Gastroenterology

## 2017-09-22 ENCOUNTER — Other Ambulatory Visit: Payer: Self-pay

## 2017-09-22 ENCOUNTER — Ambulatory Visit (HOSPITAL_COMMUNITY)
Admission: RE | Admit: 2017-09-22 | Discharge: 2017-09-22 | Disposition: A | Discharge status: Home / Self Care | Payer: Medicare Other | Source: Ambulatory Visit | Attending: Gastroenterology | Admitting: Gastroenterology

## 2017-09-22 DIAGNOSIS — I110 Hypertensive heart disease with heart failure: Secondary | ICD-10-CM | POA: Insufficient documentation

## 2017-09-22 DIAGNOSIS — Z79899 Other long term (current) drug therapy: Secondary | ICD-10-CM | POA: Diagnosis not present

## 2017-09-22 DIAGNOSIS — Q438 Other specified congenital malformations of intestine: Secondary | ICD-10-CM | POA: Diagnosis not present

## 2017-09-22 DIAGNOSIS — E119 Type 2 diabetes mellitus without complications: Secondary | ICD-10-CM | POA: Diagnosis not present

## 2017-09-22 DIAGNOSIS — D123 Benign neoplasm of transverse colon: Secondary | ICD-10-CM

## 2017-09-22 DIAGNOSIS — Z1211 Encounter for screening for malignant neoplasm of colon: Secondary | ICD-10-CM

## 2017-09-22 DIAGNOSIS — J45909 Unspecified asthma, uncomplicated: Secondary | ICD-10-CM | POA: Insufficient documentation

## 2017-09-22 DIAGNOSIS — Z7982 Long term (current) use of aspirin: Secondary | ICD-10-CM | POA: Insufficient documentation

## 2017-09-22 DIAGNOSIS — E785 Hyperlipidemia, unspecified: Secondary | ICD-10-CM | POA: Diagnosis not present

## 2017-09-22 DIAGNOSIS — I509 Heart failure, unspecified: Secondary | ICD-10-CM | POA: Diagnosis not present

## 2017-09-22 HISTORY — PX: COLONOSCOPY: SHX5424

## 2017-09-22 LAB — GLUCOSE, CAPILLARY
Comment 1: 0
GLUCOSE-CAPILLARY: 86 mg/dL (ref 70–99)
Glucose-Capillary: 57 mg/dL — ABNORMAL LOW (ref 70–99)

## 2017-09-22 SURGERY — COLONOSCOPY
Anesthesia: Moderate Sedation

## 2017-09-22 MED ORDER — DEXTROSE-NACL 5-0.9 % IV SOLN
INTRAVENOUS | Status: DC
  Administered 2017-09-22: 11:00:00 via INTRAVENOUS

## 2017-09-22 MED ORDER — MEPERIDINE HCL 100 MG/ML IJ SOLN
INTRAMUSCULAR | Status: DC | PRN
  Administered 2017-09-22: 25 mg

## 2017-09-22 MED ORDER — MEPERIDINE HCL 100 MG/ML IJ SOLN
INTRAMUSCULAR | Status: AC
  Filled 2017-09-22: qty 2

## 2017-09-22 MED ORDER — SODIUM CHLORIDE 0.9 % IV SOLN: INTRAVENOUS | Status: DC

## 2017-09-22 MED ORDER — MIDAZOLAM HCL 5 MG/5ML IJ SOLN
INTRAMUSCULAR | Status: AC
  Filled 2017-09-22: qty 10

## 2017-09-22 MED ORDER — MIDAZOLAM HCL 5 MG/5ML IJ SOLN
INTRAMUSCULAR | Status: DC | PRN
  Administered 2017-09-22: 1 mg via INTRAVENOUS
  Administered 2017-09-22 (×2): 2 mg via INTRAVENOUS

## 2017-09-22 MED ORDER — STERILE WATER FOR IRRIGATION IR SOLN
Status: DC | PRN
  Administered 2017-09-22: 100 mL

## 2017-09-22 NOTE — H&P (Signed)
Primary Care Physician:  Iona Beard, MD Primary Gastroenterologist:  Dr. Oneida Alar  Pre-Procedure History & Physical: HPI:  [name] is a 52 y.o. female here for [place].  Past Medical History:  Diagnosis Date  . ARF (acute renal failure) ([place]) 05/03/2014  . Asthma   . Asthma exacerbation 05/03/2014  . Asthma, severe persistent 05/03/2014  . CHF (congestive heart failure) ([place])   . Diabetes mellitus   . Diabetes mellitus type 2 in obese ([place]) 05/03/2014  . History of echocardiogram 02/8561   LVH, diastolic dysfunction  . Hyperlipidemia   . Hypertension   . Hyponatremia 12/02/2014  . Obesity   . SOB (shortness of breath) 12/22/2014    Past Surgical History:  Procedure Laterality Date  . CARDIAC CATHETERIZATION  03/2013   normal coronary arteries, EF 55%  . CARDIAC CATHETERIZATION  03/2013   normal coronary arteries  . CATARACT EXTRACTION W/PHACO Right 12/10/2012   Procedure: CATARACT EXTRACTION PHACO AND INTRAOCULAR LENS PLACEMENT ([place]);  Surgeon: Tonny Branch, MD;  Location: [facility];  Service: Ophthalmology;  Laterality: Right;  CDE:22..42  . CATARACT EXTRACTION W/PHACO Left 05/11/2015   Procedure: CATARACT EXTRACTION PHACO AND INTRAOCULAR LENS PLACEMENT LEFT EYE CDE=6.54;  Surgeon: Tonny Branch, MD;  Location: [facility];  Service: Ophthalmology;  Laterality: Left;  . EYE SURGERY Left   . LEFT HEART CATHETERIZATION WITH CORONARY ANGIOGRAM N/A 04/09/2013   Procedure: LEFT HEART CATHETERIZATION WITH CORONARY ANGIOGRAM;  Surgeon: Laverda Page, MD;  Location[facility];  Service: Cardiovascular;  Laterality: N/A;  . TRACHEOSTOMY     at age 25 from asthma attack    Prior to Admission medications   Medication Sig Start Date End Date Taking? Authorizing Provider  acetaminophen (TYLENOL) 500 MG tablet Take 1,000 mg by mouth every 6 (six) hours as needed for moderate pain.   Yes [provider]  albuterol (PROVENTIL HFA;VENTOLIN HFA) 108 (90 BASE) MCG/ACT  inhaler Inhale 2 puffs into the lungs every 6 (six) hours as needed for wheezing.   Yes [provider]  albuterol (PROVENTIL) (2.5 MG/3ML) 0.083% nebulizer solution Take 3 mLs (2.5 mg total) by nebulization every 6 (six) hours as needed for wheezing or shortness of breath. 12/21/14  Yes Joy, Shawn C, PA-C  aspirin EC 81 MG tablet Take 162 mg by mouth daily.    Yes [provider]  atorvastatin (LIPITOR) 20 MG tablet Take 20 mg by mouth daily.    Yes [provider]  calcium carbonate (OSCAL) 1500 (600 Ca) MG TABS tablet Take 600 mg of elemental calcium by mouth 2 (two) times daily with a meal.   Yes [provider]  carvedilol (COREG) 6.25 MG tablet Take 6.25 mg by mouth 2 (two) times daily.  11/08/13  Yes [provider]  Cholecalciferol (VITAMIN D) 2000 units tablet Take 2,000 Units by mouth daily.    Yes [provider]  fluorometholone (FML) 0.1 % ophthalmic suspension Place 1 drop into both eyes 2 (two) times daily. 08/25/17  Yes [provider]  Fluticasone-Salmeterol (ADVAIR) 100-50 MCG/DOSE AEPB Inhale 1 puff into the lungs every 12 (twelve) hours as needed (for shortness of breath).    Yes [provider]  gabapentin (NEURONTIN) 300 MG capsule Take 600 mg by mouth 2 (two) times daily.    Yes [provider]  Insulin Isophane & Regular Human (HUMULIN 70/30 KWIKPEN) (70-30) 100 UNIT/ML PEN Inject 62 Units into the skin 2 (two) times daily. Patient taking differently: Inject  64 Units into the skin 2 (two) times daily.  12/22/14  Yes Barton Dubois, MD  liraglutide (VICTOZA) 18 MG/3ML SOPN Inject 1.8 mg into the skin daily.    Yes [provider]  lisinopril (PRINIVIL,ZESTRIL) 2.5 MG tablet TAKE ONE TABLET BY MOUTH ONCE DAILY. 05/08/17  Yes [provider]  montelukast (SINGULAIR) 10 MG tablet Take 10 mg by mouth daily.    Yes [provider]  nitroGLYCERIN (NITROSTAT) 0.4 MG SL tablet Place  0.4 mg under the tongue every 5 (five) minutes as needed for chest pain.   Yes [provider]  tetrahydrozoline 0.05 % ophthalmic solution Place 1 drop into both eyes daily as needed. Dry, Itchy Eyes   Yes [provider]  torsemide (DEMADEX) 20 MG tablet Take 1 tablet (20 mg total) by mouth daily. Patient taking differently: Take 80 mg by mouth daily.  12/05/14  Yes Isaac Bliss, Rayford Halsted, MD  pantoprazole (PROTONIX) 20 MG tablet Take 1 tablet (20 mg total) by mouth daily. Patient not taking: Reported on 09/18/2017 03/24/15   Milton Ferguson, MD    Allergies as of 08/10/2017  . (No Known Allergies)    Family History  Problem Relation Age of Onset  . Diabetes Mother   . Asthma Other   . Hypertension Other     Social History   Socioeconomic History  . Marital status: Single    Spouse name: Not on file  . Number of children: Not on file  . Years of education: Not on file  . Highest education level: Not on file  Occupational History  . Not on file  Social Needs  . Financial resource strain: Not on file  . Food insecurity:    Worry: Never true    Inability: Never true  . Transportation needs:    Medical: Not on file    Non-medical: Not on file  Tobacco Use  . Smoking status: Never Smoker  . Smokeless tobacco: Never Used  Substance and Sexual Activity  . Alcohol use: Yes    Comment: occasional  . Drug use: No  . Sexual activity: Yes    Birth control/protection: None  Lifestyle  . Physical activity:    Days per week: Not on file    Minutes per session: Not on file  . Stress: Not on file  Relationships  . Social connections:    Talks on phone: Not on file    Gets together: Not on file    Attends religious service: Not on file    Active member of club or organization: Not on file    Attends meetings of clubs or organizations: Not on file    Relationship status: Not on file  . Intimate partner violence:    Fear of current or ex partner: Not on  file    Emotionally abused: Not on file    Physically abused: Not on file    Forced sexual activity: Not on file  Other Topics Concern  . Not on file  Social History Narrative  . Not on file    Review of Systems: See HPI, otherwise negative ROS   Physical Exam: Pulse 74   Temp 97.7 F (36.5 C) (Oral)   Resp (!) 22   LMP 09/11/2013   SpO2 100%  General:   Alert,  pleasant and cooperative in NAD Head:  Normocephalic and atraumatic. Neck:  Supple; Lungs:  Clear throughout to auscultation.    Heart:  Regular rate and rhythm. Abdomen:  Soft,  nontender and nondistended. Normal bowel sounds, without guarding, and without rebound.   Neurologic:  Alert and  oriented x4;  grossly normal neurologically.  Impression/Plan:    SCREENING  Plan:  1. TCS TODAY DISCUSSED PROCEDURE, BENEFITS, & RISKS: < 1% chance of medication reaction, bleeding, perforation, or rupture of spleen/liver.

## 2017-09-22 NOTE — Op Note (Signed)
[facility] Patient Name: [name] Procedure Date: 09/22/2017 9:40 AM MRN: [id number] Date of Birth: [birth year] Attending MD: Barney Drain MD, MD CSN: [id number] Age: 52 Admit Type: Outpatient Procedure:                Colonoscopy WITH COLD SNARE/BIOPSY POLYPECTOMY Indications:              Screening for colorectal malignant neoplasm Providers:                Barney Drain MD, MD, Rosina Lowenstein, RN, Aram Candela Referring MD:             Barrie Folk. Hill MD, MD Medicines:                Meperidine 25 mg IV, Midazolam 5 mg IV Complications:            No immediate complications. Estimated Blood Loss:     Estimated blood loss was minimal. Procedure:                Pre-Anesthesia Assessment:                           - Prior to the procedure, a History and Physical                            was performed, and patient medications and                            allergies were reviewed. The patient's tolerance of                            previous anesthesia was also reviewed. The risks                            and benefits of the procedure and the sedation                            options and risks were discussed with the patient.                            All questions were answered, and informed consent                            was obtained. Prior Anticoagulants: The patient has                            taken aspirin, last dose was 2 days prior to                            procedure. ASA Grade Assessment: II - A patient                            with mild systemic disease. After reviewing the                            risks and benefits, the patient was deemed in  satisfactory condition to undergo the procedure.                            After obtaining informed consent, the colonoscope                            was passed under direct vision. Throughout the                            procedure, the patient's blood pressure, pulse, and                             oxygen saturations were monitored continuously. The                            [id number]-[id number] ([id number]) scope was introduced through                            the anus and advanced to the the cecum, identified                            by appendiceal orifice and ileocecal valve. The                            colonoscopy was somewhat difficult due to a                            tortuous colon. Successful completion of the                            procedure was aided by straightening and shortening                            the scope to obtain bowel loop reduction. The                            patient tolerated the procedure well. The quality                            of the bowel preparation was good. The ileocecal                            valve, appendiceal orifice, and rectum were                            photographed. Scope In: 11:02:50 AM Scope Out: 62:03:55 AM Scope Withdrawal Time: 0 hours 10 minutes 50 seconds  Total Procedure Duration: 0 hours 13 minutes 19 seconds  Findings:      A 6 mm polyp was found in the distal transverse colon. The polyp was       sessile. The polyp was removed with a cold snare. Resection and       retrieval were complete.      Two sessile polyps were found in the hepatic flexure. The polyps  were 2       to 3 mm in size. These polyps were removed with a cold biopsy forceps.       Resection and retrieval were complete.      The recto-sigmoid colon and sigmoid colon were mildly redundant. Impression:               - One 6 mm polyp in the distal transverse colon,                            removed with a cold snare. Resected and retrieved.                           - Two 2 to 3 mm polyps at the hepatic flexure,                            removed with a cold biopsy forceps. Resected and                            retrieved.                           - Redundant LEFT colon. Moderate Sedation:      Moderate (conscious)  sedation was administered by the endoscopy nurse       and supervised by the endoscopist. The following parameters were       monitored: oxygen saturation, heart rate, blood pressure, and response       to care. Total physician intraservice time was 22 minutes. Recommendation:           - Patient has a contact number available for                            emergencies. The signs and symptoms of potential                            delayed complications were discussed with the                            patient. Return to normal activities tomorrow.                            Written discharge instructions were provided to the                            patient.                           - Resume previous diet.                           - Continue present medications.                           - Await pathology results.                           - Repeat colonoscopy in  5-10 years for surveillance. Procedure Code(s):        --- Professional ---                           [phone], Colonoscopy, flexible; with removal of                            tumor(s), polyp(s), or other lesion(s) by snare                            technique                           45380, 59, Colonoscopy, flexible; with biopsy,                            single or multiple                           G0500, Moderate sedation services provided by the                            same physician or other qualified health care                            professional performing a gastrointestinal                            endoscopic service that sedation supports,                            requiring the presence of an independent trained                            observer to assist in the monitoring of the                            patient's level of consciousness and physiological                            status; initial 15 minutes of intra-service time;                            patient age 62 years or older (additional  time may                            be reported with [phone], as appropriate) Diagnosis Code(s):        --- Professional ---                           D12.3, Benign neoplasm of transverse colon (hepatic                            flexure or splenic flexure)  Z12.11, Encounter for screening for malignant                            neoplasm of colon                           Q43.8, Other specified congenital malformations of                            intestine CPT copyright 2017 [employer]. All rights reserved. The codes documented in this report are preliminary and upon coder review may  be revised to meet current compliance requirements. Barney Drain, MD Barney Drain MD, MD 09/22/2017 11:41:18 AM This report has been signed electronically. Number of Addenda: 0

## 2017-09-22 NOTE — Discharge Instructions (Signed)
You had 3 polyps removed.    DRINK WATER TO KEEP YOUR URINE LIGHT YELLOW.  FOLLOW A HIGH FIBER DIET. AVOID ITEMS THAT CAUSE BLOATING & GAS. SEE INFO BELOW.  CONTINUE YOUR WEIGHT LOSS EFFORTS. OBESITY IS ASSOCIATED WITH AN INCREASED FOR CIRRHOSIS AND ALL CANCERS, INCLUDING ESOPHAGEAL AND COLON CANCER.   TO PREVENT ULCERS AND GASTRITIS DUE TO DAILY ASPIRIN USE, CONTINUE PROTONIX. TAKE 30 MINUTES PRIOR TO BREAKFAST.  YOUR BIOPSY RESULTS WILL BE AVAILABLE IN 7 DAYS.  Next colonoscopy in 3-10 years.    Colonoscopy Care After Read the instructions outlined below and refer to this sheet in the next week. These discharge instructions provide you with general information on caring for yourself after you leave the hospital. While your treatment has been planned according to the most current medical practices available, unavoidable complications occasionally occur. If you have any problems or questions after discharge, call DR. FIELDS, [phone].    ACTIVITY  You may resume your regular activity, but move at a slower pace for the next 24 hours.   Take frequent rest periods for the next 24 hours.   Walking will help get rid of the air and reduce the bloated feeling in your belly (abdomen).   No driving for 24 hours (because of the medicine (anesthesia) used during the test).   You may shower.   Do not sign any important legal documents or operate any machinery for 24 hours (because of the anesthesia used during the test).    NUTRITION  Drink plenty of fluids.   You may resume your normal diet as instructed by your doctor.   Begin with a light meal and progress to your normal diet. Heavy or fried foods are harder to digest and may make you feel sick to your stomach (nauseated).   Avoid alcoholic beverages for 24 hours or as instructed.    MEDICATIONS  You may resume your normal medications.   WHAT YOU CAN EXPECT TODAY  Some feelings of bloating in the abdomen.   Passage  of more gas than usual.   Spotting of blood in your stool or on the toilet paper  .  IF YOU HAD POLYPS REMOVED DURING THE COLONOSCOPY:  Eat a soft diet IF YOU HAVE NAUSEA, BLOATING, ABDOMINAL PAIN, OR VOMITING.    FINDING OUT THE RESULTS OF YOUR TEST Not all test results are available during your visit. DR. Oneida Alar WILL CALL YOU WITHIN 14 DAYS OF YOUR PROCEDUE WITH YOUR RESULTS. Do not assume everything is normal if you have not heard from DR. FIELDS, CALL HER OFFICE AT [phone].  SEEK IMMEDIATE MEDICAL ATTENTION AND CALL THE OFFICE: [phone] IF:  You have more than a spotting of blood in your stool.   Your belly is swollen (abdominal distention).   You are nauseated or vomiting.   You have a temperature over 101F.   You have abdominal pain or discomfort that is severe or gets worse throughout the day.   High-Fiber Diet A high-fiber diet changes your normal diet to include more whole grains, legumes, fruits, and vegetables. Changes in the diet involve replacing refined carbohydrates with unrefined foods. The calorie level of the diet is essentially unchanged. The Dietary Reference Intake (recommended amount) for adult males is 38 grams per day. For adult females, it is 25 grams per day. Pregnant and lactating women should consume 28 grams of fiber per day. Fiber is the intact part of a plant that is not broken down during digestion. Functional fiber is  fiber that has been isolated from the plant to provide a beneficial effect in the body. PURPOSE  Increase stool bulk.   Ease and regulate bowel movements.   Lower cholesterol.   REDUCE RISK OF COLON CANCER  INDICATIONS THAT YOU NEED MORE FIBER  Constipation and hemorrhoids.   Uncomplicated diverticulosis (intestine condition) and irritable bowel syndrome.   Weight management.   As a protective measure against hardening of the arteries (atherosclerosis), diabetes, and cancer.   GUIDELINES FOR INCREASING FIBER IN  THE DIET  Start adding fiber to the diet slowly. A gradual increase of about 5 more grams (2 slices of whole-wheat bread, 2 servings of most fruits or vegetables, or 1 bowl of high-fiber cereal) per day is best. Too rapid an increase in fiber may result in constipation, flatulence, and bloating.   Drink enough water and fluids to keep your urine clear or pale yellow. Water, juice, or caffeine-free drinks are recommended. Not drinking enough fluid may cause constipation.   Eat a variety of high-fiber foods rather than one type of fiber.   Try to increase your intake of fiber through using high-fiber foods rather than fiber pills or supplements that contain small amounts of fiber.   The goal is to change the types of food eaten. Do not supplement your present diet with high-fiber foods, but replace foods in your present diet.   INCLUDE A VARIETY OF FIBER SOURCES  Replace refined and processed grains with whole grains, canned fruits with fresh fruits, and incorporate other fiber sources. White rice, white breads, and most bakery goods contain little or no fiber.   Brown whole-grain rice, buckwheat oats, and many fruits and vegetables are all good sources of fiber. These include: broccoli, Brussels sprouts, cabbage, cauliflower, beets, sweet potatoes, white potatoes (skin on), carrots, tomatoes, eggplant, squash, berries, fresh fruits, and dried fruits.   Cereals appear to be the richest source of fiber. Cereal fiber is found in whole grains and bran. Bran is the fiber-rich outer coat of cereal grain, which is largely removed in refining. In whole-grain cereals, the bran remains. In breakfast cereals, the largest amount of fiber is found in those with "bran" in their names. The fiber content is sometimes indicated on the label.   You may need to include additional fruits and vegetables each day.   In baking, for 1 cup white flour, you may use the following substitutions:   1 cup whole-wheat flour  minus 2 tablespoons.   1/2 cup white flour plus 1/2 cup whole-wheat flour.   Polyps, Colon  A polyp is extra tissue that grows inside your body. Colon polyps grow in the large intestine. The large intestine, also called the colon, is part of your digestive system. It is a long, hollow tube at the end of your digestive tract where your body makes and stores stool. Most polyps are not dangerous. They are benign. This means they are not cancerous. But over time, some types of polyps can turn into cancer. Polyps that are smaller than a pea are usually not harmful. But larger polyps could someday become or may already be cancerous. To be safe, doctors remove all polyps and test them.   WHO GETS POLYPS? Anyone can get polyps, but certain people are more likely than others. You may have a greater chance of getting polyps if:  You are over 50.   You have had polyps before.   Someone in your family has had polyps.   Someone in your family  has had cancer of the large intestine.   Find out if someone in your family has had polyps. You may also be more likely to get polyps if you:   Eat a lot of fatty foods   Smoke   Drink alcohol   Do not exercise  Eat too much   PREVENTION There is not one sure way to prevent polyps. You might be able to lower your risk of getting them if you:  Eat more fruits and vegetables and less fatty food.   Do not smoke.   Avoid alcohol.   Exercise every day.   Lose weight if you are overweight.   Eating more calcium and folate can also lower your risk of getting polyps. Some foods that are rich in calcium are milk, cheese, and broccoli. Some foods that are rich in folate are chickpeas, kidney beans, and spinach.

## 2017-09-25 ENCOUNTER — Telehealth: Payer: Self-pay | Admitting: Gastroenterology

## 2017-09-25 NOTE — Telephone Encounter (Signed)
Pt is aware.  

## 2017-09-25 NOTE — Telephone Encounter (Signed)
  PLEASE CALL PT. SHE HAD THREE SIMPLE ADENOMAS REMOVED.   DRINK WATER TO KEEP YOUR URINE LIGHT YELLOW. FOLLOW A HIGH FIBER DIET. AVOID ITEMS THAT CAUSE BLOATING & GAS.  Next colonoscopy in 3 years.

## 2017-09-25 NOTE — Telephone Encounter (Signed)
ON RECALL  °

## 2017-09-26 ENCOUNTER — Ambulatory Visit (INDEPENDENT_AMBULATORY_CARE_PROVIDER_SITE_OTHER): Payer: Medicare Other | Admitting: Psychiatry

## 2017-09-26 DIAGNOSIS — F509 Eating disorder, unspecified: Secondary | ICD-10-CM

## 2017-09-27 ENCOUNTER — Encounter (HOSPITAL_COMMUNITY): Payer: Self-pay | Admitting: Gastroenterology

## 2017-10-03 ENCOUNTER — Ambulatory Visit: Payer: Self-pay | Admitting: Psychiatry

## 2017-10-03 DIAGNOSIS — E1140 Type 2 diabetes mellitus with diabetic neuropathy, unspecified: Secondary | ICD-10-CM | POA: Diagnosis not present

## 2017-10-03 DIAGNOSIS — L609 Nail disorder, unspecified: Secondary | ICD-10-CM | POA: Diagnosis not present

## 2017-10-03 DIAGNOSIS — L110 Acquired keratosis follicularis: Secondary | ICD-10-CM | POA: Diagnosis not present

## 2017-10-04 ENCOUNTER — Encounter: Payer: Self-pay | Admitting: Skilled Nursing Facility1

## 2017-10-04 ENCOUNTER — Encounter: Payer: Medicare Other | Attending: General Surgery | Admitting: Skilled Nursing Facility1

## 2017-10-04 DIAGNOSIS — E119 Type 2 diabetes mellitus without complications: Secondary | ICD-10-CM | POA: Insufficient documentation

## 2017-10-04 DIAGNOSIS — Z6841 Body Mass Index (BMI) 40.0 and over, adult: Secondary | ICD-10-CM | POA: Insufficient documentation

## 2017-10-04 DIAGNOSIS — E7800 Pure hypercholesterolemia, unspecified: Secondary | ICD-10-CM | POA: Insufficient documentation

## 2017-10-04 DIAGNOSIS — R030 Elevated blood-pressure reading, without diagnosis of hypertension: Secondary | ICD-10-CM | POA: Diagnosis not present

## 2017-10-04 DIAGNOSIS — Z713 Dietary counseling and surveillance: Secondary | ICD-10-CM | POA: Insufficient documentation

## 2017-10-04 NOTE — Progress Notes (Signed)
[facility]  Appt start time: 11:15 end time: 11:40  Assessment: 4th SWL Appointment.   Start Wt at NDES: 351.1 Wt: 348.8  344 BMI: 67.56   Pt arrives having gained 4.4 lbs from previous visit. Pt states surgeon wants her to lose 25 lbs from 351 lbs.   Pt states she likes Premier Protein (peaches and cream flavor). Pt states she has increased physical activity this week moving furniture in her home. Pt states she can go to [facility] but needs someone to take her. Pt states she helps take care of niece and nephew sometimes.   Pt is talkative. Pt states she has not purchased any carbonated water since previous visit. Pt states she is eating 3 meals a day. Pt states is consuming 64 ounces of fluid a day. Pt states she drinks more water now. Pt states she has reduced caffeine intake to 1 cup a day.  Pt states her cousin told her she needed to eat from 3 oz cups. Pt states she likes watermelon. Pt states she likes to cook with butter and has to have dressing with her vegetables to avoid food being bland.   Pt states she reduced fried food intake and has been using her air fryer for chicken. Pt states she is supposed to check BS 4x/day but checks 2x/day: FBS (145) and before bed (200). Pt states she has an appt next week and will find out A1c then.   Pt states she was diagnosed with diabetes at age 83, almost 46 years ago. Pt states she does not drink sodas.  Pt states she may have had A1c in Feb 2019 but does not remember the value. Pt states everyone on her dad's side of the family was "heavy set" and uncle was over 700 lbs before passing away. Pt states she goes to [employer] 2[place]3x/week and has a hot dog there. Pt states she eats when she craves things. Pt states she has cravings for cookies at times and once she eats the cookie, she is fine. 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   Pt arrives having lost about 4 pounds from her last visit. Pt states she knows she should not but she got back to drinking  sparkling water. Pt admits to struggling with lifestyle changes. Pt states she has not tried any other protein shakes. Pt states she tries to eat less dried fruit but then eats too much fruit and ends up having a bowel movement shortly there after. Pt states her doctor told her she needs more fiber and has fiber supplements and states she needs to get back using them. [facility] she is trying to use resistance bands or 5 pound weights. Pt states she does not sleep until 1:30am. Pt states she keeps her great nephew and he likes to watch tv at night.  Pt recognized she would not be able to eat 5-6 meals a day due to her sleep schedule.   Per insurance, pt needs 6 SWL visits prior to surgery. Pt will need Vitamin and Mineral Recommendations at next visit (September visit)  MEDICATIONS: See list   DIETARY INTAKE:  24-hr recall:  B ( AM): boiled eggs or sandwich with 1 cup of coffee Snk ( AM): dried fruit + almonds or sugar-free jello  L ( PM): sausage biscuit Snk ( PM): dried fruit or sugar-free jello  D ( PM): hot dog, potato chips or salad with chicken with french dressing  Snk ( PM): sometimes sugar-free jello or trail mix Beverages: 1 cup of coffee, water with flavored packs, sparkling water  Diet to Follow: 1800 calories 200 g carbohydrates 135 g protein 50 g fat    Nutritional Diagnosis:  [place]-3.3 Overweight/obesity related to past poor dietary habits and physical inactivity as evidenced by patient w/ planned RYGB surgery following dietary guidelines for continued weight loss.    Intervention:  Nutrition counseling for upcoming Bariatric Surgery. Pt was educated and counseled on appropriate Protein Shakes for bariatric surgery. Pt was in agreement with goals listed.  Goals:  - Aim for 150 minutes of physical activity including cardio and weight bearing every week -3 days a week for 30 minutes: elliptical or walking or resistance bands  -Find 2 more  protein shake flavors  - Work on being in bed by 11pm -Do not drink with meals: stop drinking 15 minutes before you eat, drink nothing while you are eating, and wait 30 minutes after you have finished eating before you drink again   Barriers to learning/adherence to lifestyle change: contemplative stage of change  Demonstrated degree of understanding via:  Teach Back   Monitoring/Evaluation:  Dietary intake, exercise, and body weight in 1 month(s).

## 2017-10-04 NOTE — Patient Instructions (Addendum)
-  3 days a week for 30 minutes: elliptical or walking or resistance bands   -Find 2 more protein shake flavors   - Work on being in bed by 11pm  -Do not drink with meals: stop drinking 15 minutes before you eat, drink nothing while you are eating, and wait 30 minutes after you have finished eating before you drink again

## 2017-10-06 DIAGNOSIS — H524 Presbyopia: Secondary | ICD-10-CM | POA: Diagnosis not present

## 2017-10-06 DIAGNOSIS — H52223 Regular astigmatism, bilateral: Secondary | ICD-10-CM | POA: Diagnosis not present

## 2017-10-14 DIAGNOSIS — J45901 Unspecified asthma with (acute) exacerbation: Secondary | ICD-10-CM | POA: Diagnosis not present

## 2017-10-14 DIAGNOSIS — J9601 Acute respiratory failure with hypoxia: Secondary | ICD-10-CM | POA: Diagnosis not present

## 2017-10-17 ENCOUNTER — Ambulatory Visit (INDEPENDENT_AMBULATORY_CARE_PROVIDER_SITE_OTHER): Payer: Medicare Other | Admitting: Psychiatry

## 2017-10-17 DIAGNOSIS — F509 Eating disorder, unspecified: Secondary | ICD-10-CM | POA: Diagnosis not present

## 2017-10-20 DIAGNOSIS — H3582 Retinal ischemia: Secondary | ICD-10-CM | POA: Diagnosis not present

## 2017-10-20 DIAGNOSIS — H35372 Puckering of macula, left eye: Secondary | ICD-10-CM | POA: Diagnosis not present

## 2017-10-20 DIAGNOSIS — E103593 Type 1 diabetes mellitus with proliferative diabetic retinopathy without macular edema, bilateral: Secondary | ICD-10-CM | POA: Diagnosis not present

## 2017-10-31 DIAGNOSIS — I509 Heart failure, unspecified: Secondary | ICD-10-CM | POA: Diagnosis not present

## 2017-10-31 DIAGNOSIS — E559 Vitamin D deficiency, unspecified: Secondary | ICD-10-CM | POA: Diagnosis not present

## 2017-10-31 DIAGNOSIS — N183 Chronic kidney disease, stage 3 (moderate): Secondary | ICD-10-CM | POA: Diagnosis not present

## 2017-10-31 DIAGNOSIS — E039 Hypothyroidism, unspecified: Secondary | ICD-10-CM | POA: Diagnosis not present

## 2017-10-31 DIAGNOSIS — D509 Iron deficiency anemia, unspecified: Secondary | ICD-10-CM | POA: Diagnosis not present

## 2017-10-31 DIAGNOSIS — E1122 Type 2 diabetes mellitus with diabetic chronic kidney disease: Secondary | ICD-10-CM | POA: Diagnosis not present

## 2017-10-31 DIAGNOSIS — Z79899 Other long term (current) drug therapy: Secondary | ICD-10-CM | POA: Diagnosis not present

## 2017-10-31 DIAGNOSIS — A048 Other specified bacterial intestinal infections: Secondary | ICD-10-CM | POA: Diagnosis not present

## 2017-10-31 DIAGNOSIS — R809 Proteinuria, unspecified: Secondary | ICD-10-CM | POA: Diagnosis not present

## 2017-10-31 DIAGNOSIS — E785 Hyperlipidemia, unspecified: Secondary | ICD-10-CM | POA: Diagnosis not present

## 2017-10-31 DIAGNOSIS — R69 Illness, unspecified: Secondary | ICD-10-CM | POA: Diagnosis not present

## 2017-10-31 DIAGNOSIS — I10 Essential (primary) hypertension: Secondary | ICD-10-CM | POA: Diagnosis not present

## 2017-11-01 DIAGNOSIS — N183 Chronic kidney disease, stage 3 (moderate): Secondary | ICD-10-CM | POA: Diagnosis not present

## 2017-11-01 DIAGNOSIS — N2581 Secondary hyperparathyroidism of renal origin: Secondary | ICD-10-CM | POA: Diagnosis not present

## 2017-11-01 DIAGNOSIS — R809 Proteinuria, unspecified: Secondary | ICD-10-CM | POA: Diagnosis not present

## 2017-11-01 DIAGNOSIS — D638 Anemia in other chronic diseases classified elsewhere: Secondary | ICD-10-CM | POA: Diagnosis not present

## 2017-11-02 ENCOUNTER — Encounter: Payer: Self-pay | Admitting: Registered"

## 2017-11-02 ENCOUNTER — Encounter: Payer: Medicare Other | Attending: General Surgery | Admitting: Registered"

## 2017-11-02 DIAGNOSIS — Z713 Dietary counseling and surveillance: Secondary | ICD-10-CM | POA: Diagnosis not present

## 2017-11-02 DIAGNOSIS — R030 Elevated blood-pressure reading, without diagnosis of hypertension: Secondary | ICD-10-CM | POA: Insufficient documentation

## 2017-11-02 DIAGNOSIS — E119 Type 2 diabetes mellitus without complications: Secondary | ICD-10-CM | POA: Diagnosis not present

## 2017-11-02 DIAGNOSIS — E7800 Pure hypercholesterolemia, unspecified: Secondary | ICD-10-CM | POA: Insufficient documentation

## 2017-11-02 DIAGNOSIS — Z6841 Body Mass Index (BMI) 40.0 and over, adult: Secondary | ICD-10-CM | POA: Diagnosis not present

## 2017-11-02 NOTE — Progress Notes (Addendum)
[facility]  Appt start time: 9:15 end time: 9:45  Assessment: 5th SWL Appointment.   Start Wt at NDES: 351.1 Wt: 344.5 BMI: 66.72   Pt arrives having maintained weight from previous visit. Pt states surgeon wants her to lose 25 lbs from 351 lbs.   Pt states she likes Premier Protein (peaches and cream, banana and cream flavor). Pt states she is still working on not drinking around eating. Pt states she is able to not drink while eating but still working on waiting 30 minutes afterwards to drink. Pt states she is becoming preoccupied with how her life will be different after surgery. Pt states she is working on going to bed earlier so that she can wake up earlier.   Pt states she has increased physical activity this week moving furniture in her home. Pt states she can go to [facility] but needs someone to take her. Pt states she helps take care of niece and nephew sometimes.   Pt is talkative. Pt states she has not purchased any carbonated water since previous visit. Pt states she is eating 3 meals a day. Pt states is consuming 64 ounces of fluid a day. Pt states she drinks more water now. Pt states she has reduced caffeine intake to 1 cup a day.  Pt states she reduced fried food intake and has been using her air fryer for chicken. Pt states she is supposed to check BS 4x/day but checks 2x/day: FBS (145) and before bed (200). Pt states she has an appt next week and will find out A1c then.   Pt states she was diagnosed with diabetes at age 41, almost 78 years ago. Pt states she does not drink sodas.  Pt states she may have had A1c in Feb 2019 but does not remember the value. Pt states everyone on her dad's side of the family was "heavy set" and uncle was over 700 lbs before passing away. Pt states she goes to [place] 2[place]3x/week and has a hot dog there. Pt states she eats when she craves things. Pt states she has cravings for cookies at times and once she eats the cookie, she is fine. 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   Pt states she knows she should not but she got back to drinking sparkling water. Pt admits to struggling with lifestyle changes. Pt states she has not tried any other protein shakes. Pt states she tries to eat less dried fruit but then eats too much fruit and ends up having a bowel movement shortly there after. Pt states her doctor told her she needs more fiber and has fiber supplements and states she needs to get back using them. Pt states [facility] but she is trying to use resistance bands or 5 pound weights. Pt states she does not sleep until 1:30am. Pt states she keeps her great nephew and he likes to watch tv at night.  Pt recognized she would not be able to eat 5-6 meals a day due to her sleep schedule.   Per insurance, pt needs 6 SWL visits prior to surgery.   MEDICATIONS: See list   DIETARY INTAKE:  24-hr recall:  B ( AM): boiled eggs or sandwich with 1 cup of coffee Snk ( AM): dried fruit + almonds or sugar-free jello  L ( PM): sausage biscuit Snk ( PM): dried fruit or sugar-free jello  D ( PM): hot dog, potato chips or salad with chicken with french  dressing  Snk ( PM): sometimes sugar-free jello or trail mix Beverages: 1 cup of coffee, water with flavored packs, sparkling water, whole milk  Diet to Follow: 1800 calories 200 g carbohydrates 135 g protein 50 g fat    Nutritional Diagnosis:  [place]-3.3 Overweight/obesity related to past poor dietary habits and physical inactivity as evidenced by patient w/ planned RYGB surgery following dietary guidelines for continued weight loss.    Intervention:  Nutrition counseling for upcoming Bariatric Surgery. Pt was educated and counseled on appropriate vitamin and mineral recommendations for bariatric surgery. Pt was in agreement with goals listed.  Goals:  - Aim for 150 minutes of physical activity including cardio and weight bearing every week - Look at labels of greek yogurt. Keep fat and  sugar in single digits per serving on food label. Find a couple of options.  - Continue to work on waiting 30 minutes after eating to drink.  - Compare bariatric multivitamin prices and factor into budget.   Barriers to learning/adherence to lifestyle change: contemplative stage of change  Demonstrated degree of understanding via:  Teach Back   Monitoring/Evaluation:  Dietary intake, exercise, and body weight in 1 month(s).

## 2017-11-02 NOTE — Patient Instructions (Addendum)
-   Look at labels of greek yogurt. Keep fat and sugar in single digits per serving on food label. Find a couple of options.   - Continue to work on waiting 30 minutes after eating to drink.   - Compare bariatric multivitamin prices and factor into budget.

## 2017-11-14 DIAGNOSIS — J45901 Unspecified asthma with (acute) exacerbation: Secondary | ICD-10-CM | POA: Diagnosis not present

## 2017-11-14 DIAGNOSIS — J9601 Acute respiratory failure with hypoxia: Secondary | ICD-10-CM | POA: Diagnosis not present

## 2017-12-05 ENCOUNTER — Encounter: Payer: Self-pay | Admitting: Registered"

## 2017-12-05 ENCOUNTER — Encounter: Payer: Medicare Other | Attending: General Surgery | Admitting: Registered"

## 2017-12-05 DIAGNOSIS — Z6841 Body Mass Index (BMI) 40.0 and over, adult: Secondary | ICD-10-CM | POA: Insufficient documentation

## 2017-12-05 DIAGNOSIS — E7800 Pure hypercholesterolemia, unspecified: Secondary | ICD-10-CM | POA: Insufficient documentation

## 2017-12-05 DIAGNOSIS — E119 Type 2 diabetes mellitus without complications: Secondary | ICD-10-CM | POA: Diagnosis not present

## 2017-12-05 DIAGNOSIS — Z713 Dietary counseling and surveillance: Secondary | ICD-10-CM | POA: Insufficient documentation

## 2017-12-05 DIAGNOSIS — R030 Elevated blood-pressure reading, without diagnosis of hypertension: Secondary | ICD-10-CM | POA: Diagnosis not present

## 2017-12-05 NOTE — Progress Notes (Signed)
RYGB  Appt start time: 9:15 end time: 9:45  Assessment: 6th SWL Appointment.   Start Wt at [facility]: 351.1 Wt: 348.1 BMI: 67.42   Pt arrives having gained about 3.6 lbs from previous visit. Pt states surgeon wants her to lose 25 lbs from 351 lbs.   Pt states she checks BS 4x/day: FBS (98) and after meals (120). Pt states she had an hypoglycemic episode and mom called EMS; BS was 35. Pt states she did not check her FBS that day. Pt states she does not know her recent A1c because she has not received recent lab results yet. Pt states she is chewing 30 times per bite. Pt states she is no longer drinking sparkling water. Pt states she is still working on not drinking around eating. Pt states she is able to not drink while eating but still working on stopping 15 minutes before eating and waiting 30 minutes afterwards to drink.   Pt states she likes Premier Protein (peaches and cream, banana and cream flavor). Pt states she can go to [facility] but needs someone to take her. Pt states she helps take care of niece and nephew sometimes.   Pt states she is eating 3 meals a day. Pt states is consuming 64 ounces of fluid a day. Pt states she drinks more water now. Pt states she has reduced caffeine intake to 1 cup a day. Pt states she reduced fried food intake and has been using her air fryer for chicken.  Pt states she was diagnosed with diabetes at age 56, almost 68 years ago. Pt states she does not drink sodas. Pt states everyone on her dad's side of the family was "heavy set" and uncle was over 700 lbs before passing away. Pt states she goes to [employer] 2-3x/week and has a hot dog there. Pt states she eats when she craves things. Pt states she has cravings for cookies at times and once she eats the cookie, she is fine. 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   Pt admits to struggling with lifestyle changes. Pt states she has not tried any other protein shakes. Pt states she tries to eat less dried fruit but  then eats too much fruit and ends up having a bowel movement shortly there after. Pt states her doctor told her she needs more fiber and has fiber supplements and states she needs to get back using them. [facility] but she is trying to use resistance bands or 5 pound weights. Pt states she does not sleep until 1:30am. Pt states she keeps her great nephew and he likes to watch tv at night.  Pt recognized she would not be able to eat 5-6 meals a day due to her sleep schedule.   Per insurance, pt needs 6 SWL visits prior to surgery.   MEDICATIONS: See list   DIETARY INTAKE:  24-hr recall:  B ( AM): oatmeal with 1 cup of coffee Snk ( AM): none L ( PM): grapes + sandwich Snk ( PM): mixed nuts + grapes D ( PM): hamburgers + gravy + rice Snk ( PM): sometimes mixed nuts Beverages: 1 cup of coffee, water with flavored packs, whole milk  Diet to Follow: 1800 calories 200 g carbohydrates 135 g protein 50 g fat    Nutritional Diagnosis:  [place]-3.3 Overweight/obesity related to past poor dietary habits and physical inactivity as evidenced by patient w/ planned RYGB surgery following dietary guidelines for continued weight  loss.    Intervention:  Nutrition counseling for upcoming Bariatric Surgery. Pt was educated and counseled on appropriate vitamin and mineral recommendations for bariatric surgery, not drinking around meals, decreasing caffeine intake, and creating a consistent schedule with meals/snacks. Pt will likely need at least 1 more visit to ensure readiness for bariatric surgery. Pt was in agreement with goals listed.  Goals:  - Aim for 150 minutes of physical activity including cardio and weight bearing every week - Continue to work on not drinking 15 minutes before eating and waiting 30 minutes after eating.  - Keep up the great work of not drinking while eating.  - Keep fat and sugar grams in single digits on nutrition facts label, per serving. Aim for 9  grams or less.  - Decrease caffeine intake by drinking half decaf and half caffeinated coffee.  - Get appropriate calcium supplements.   Barriers to learning/adherence to lifestyle change: contemplative stage of change  Demonstrated degree of understanding via:  Teach Back   Monitoring/Evaluation:  Dietary intake, exercise, and body weight in 1 month(s).

## 2017-12-05 NOTE — Patient Instructions (Addendum)
-   Continue to work on not drinking 15 minutes before eating and waiting 30 minutes after eating.   - Keep up the great work of not drinking while eating.   - Keep fat and sugar grams in single digits on nutrition facts label, per serving. Aim for 9 grams or less.   - Decrease caffeine intake by drinking half decaf and half caffeinated coffee.   - Get appropriate calcium supplements.

## 2017-12-06 DIAGNOSIS — E119 Type 2 diabetes mellitus without complications: Secondary | ICD-10-CM | POA: Diagnosis not present

## 2017-12-06 DIAGNOSIS — I509 Heart failure, unspecified: Secondary | ICD-10-CM | POA: Diagnosis not present

## 2017-12-11 ENCOUNTER — Encounter: Payer: Medicare Other | Admitting: Skilled Nursing Facility1

## 2017-12-11 ENCOUNTER — Encounter: Payer: Self-pay | Admitting: Skilled Nursing Facility1

## 2017-12-11 DIAGNOSIS — Z713 Dietary counseling and surveillance: Secondary | ICD-10-CM | POA: Diagnosis not present

## 2017-12-11 DIAGNOSIS — E669 Obesity, unspecified: Secondary | ICD-10-CM

## 2017-12-11 DIAGNOSIS — E119 Type 2 diabetes mellitus without complications: Secondary | ICD-10-CM | POA: Diagnosis not present

## 2017-12-11 DIAGNOSIS — R030 Elevated blood-pressure reading, without diagnosis of hypertension: Secondary | ICD-10-CM | POA: Diagnosis not present

## 2017-12-11 DIAGNOSIS — E7800 Pure hypercholesterolemia, unspecified: Secondary | ICD-10-CM | POA: Diagnosis not present

## 2017-12-11 NOTE — Progress Notes (Signed)
Pre-Operative Nutrition Class:  Appt start time: 4580   End time:  1830.  Patient was seen on 12/11/2017 for Pre-Operative Bariatric Surgery Education at [facility].   Surgery date:  Surgery type: rygb Start weight at [facility]: 344 Weight today: 352  Samples given per MNT protocol. Patient educated on appropriate usage: Bariatric Advantage Multivitamin Lot #[id number] Exp: 10/20  Bariatric Advantage Calcium  Lot # [id number] Exp: June 11, 2018  [facility] Protein  Shake Lot # [id number] Exp: mar-09-2018  The following the learning objectives were met by the patient during this course:  Identify Pre-Op Dietary Goals and will begin 2 weeks pre-operatively  Identify appropriate sources of fluids and proteins   State protein recommendations and appropriate sources pre and post-operatively  Identify Post-Operative Dietary Goals and will follow for 2 weeks post-operatively  Identify appropriate multivitamin and calcium sources  Describe the need for physical activity post-operatively and will follow MD recommendations  State when to call healthcare provider regarding medication questions or post-operative complications  Handouts given during class include:  Pre-Op Bariatric Surgery Diet Handout  Protein Shake Handout  Post-Op Bariatric Surgery Nutrition Handout  [facility] Program Information Flyer  Support Group Information Flyer  [facility] Bariatric Supplements Price List  Follow-Up Plan: [facility] post oper[facility] for diet advancement per MD.

## 2017-12-14 DIAGNOSIS — J45901 Unspecified asthma with (acute) exacerbation: Secondary | ICD-10-CM | POA: Diagnosis not present

## 2017-12-14 DIAGNOSIS — J9601 Acute respiratory failure with hypoxia: Secondary | ICD-10-CM | POA: Diagnosis not present

## 2017-12-18 ENCOUNTER — Ambulatory Visit: Payer: Self-pay

## 2017-12-19 DIAGNOSIS — E1165 Type 2 diabetes mellitus with hyperglycemia: Secondary | ICD-10-CM | POA: Diagnosis not present

## 2017-12-19 DIAGNOSIS — E638 Other specified nutritional deficiencies: Secondary | ICD-10-CM | POA: Diagnosis not present

## 2017-12-19 DIAGNOSIS — Z23 Encounter for immunization: Secondary | ICD-10-CM | POA: Diagnosis not present

## 2017-12-19 DIAGNOSIS — D638 Anemia in other chronic diseases classified elsewhere: Secondary | ICD-10-CM | POA: Diagnosis not present

## 2017-12-19 DIAGNOSIS — D529 Folate deficiency anemia, unspecified: Secondary | ICD-10-CM | POA: Diagnosis not present

## 2017-12-21 IMAGING — CT CT ANGIO CHEST
2 of 6 series · 6 of 36 positions shown · IV contrast (Omnipaque 300)
Comparison: None.

CLINICAL DATA: Sharp Left side chest pain and shortness of breath
since MJEDE today

EXAM:
CT ANGIOGRAPHY CHEST WITH CONTRAST
TECHNIQUE: Multidetector CT imaging of the chest was performed using the
standard protocol during bolus administration of intravenous
contrast. Multiplanar CT image reconstructions and MIPs were
obtained to evaluate the vascular anatomy.
CONTRAST:  100mL OMNIPAQUE IOHEXOL 350 MG/ML SOLN

[Series 5: pe 3.0 b40f · axial · 0.69mm/px · z∈[-238,-64]mm · 5 of 88 slices shown]
[im 15/88  lung]
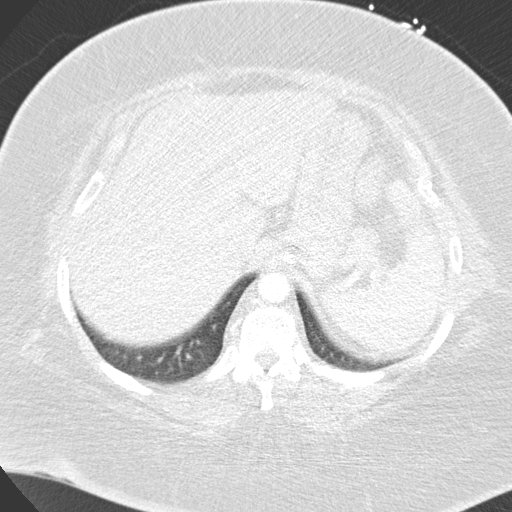
[im 30/88  mediastinal]
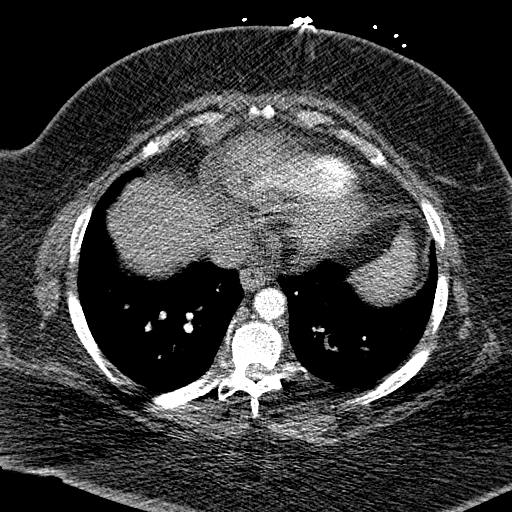
[im 44/88  lung]
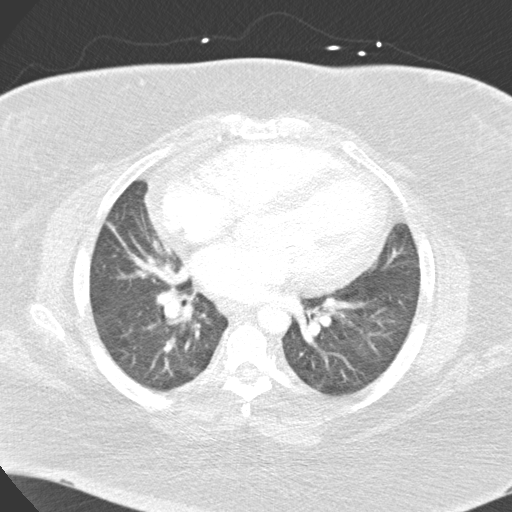
[im 59/88  mediastinal]
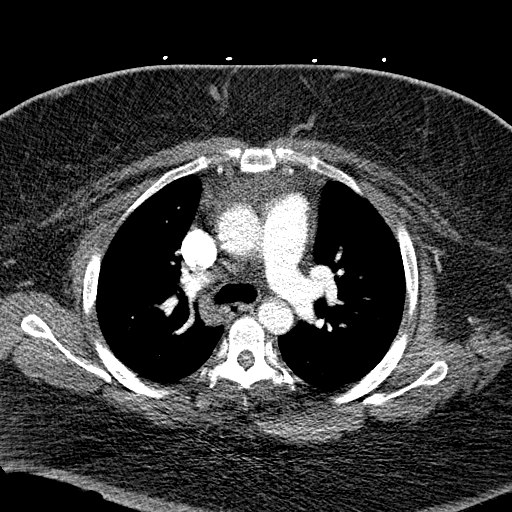
[im 73/88  lung]
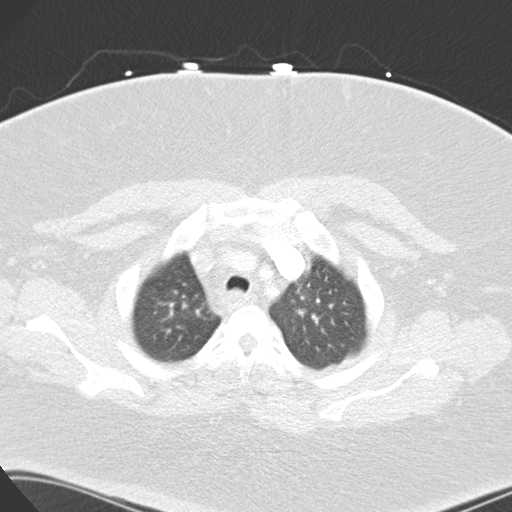

[Series 7: mpr coronal pe 3mm · coronal · 0.54mm/px · 1 of 97 slices shown]
[im 49/97  mediastinal]
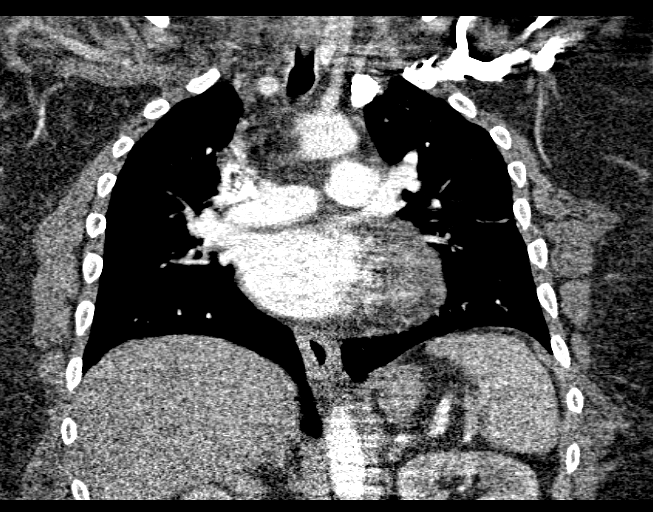

[6 of 36 positions shown; findings below may reference images not displayed]

FINDINGS: There is adequate opacification of the pulmonary arteries. There is
no pulmonary embolus. The main pulmonary artery, right main
pulmonary artery and left main pulmonary arteries are normal in
size. The heart size is enlarged. There is no pericardial effusion.

There is a bandlike area of airspace disease in the left mid lung
likely reflecting atelectasis. There is no focal consolidation,
pleural effusion or pneumothorax.

There is no axillary, hilar, or mediastinal adenopathy.

There is no lytic or blastic osseous lesion.

The visualized portions of the upper abdomen are unremarkable.

Review of the MIP images confirms the above findings.
IMPRESSION: 1. No evidence of pulmonary embolus.
2. No acute cardiopulmonary disease.

## 2017-12-26 DIAGNOSIS — E1140 Type 2 diabetes mellitus with diabetic neuropathy, unspecified: Secondary | ICD-10-CM | POA: Diagnosis not present

## 2017-12-26 DIAGNOSIS — L110 Acquired keratosis follicularis: Secondary | ICD-10-CM | POA: Diagnosis not present

## 2017-12-26 DIAGNOSIS — L609 Nail disorder, unspecified: Secondary | ICD-10-CM | POA: Diagnosis not present

## 2018-01-11 ENCOUNTER — Encounter: Payer: Medicare Other | Attending: General Surgery | Admitting: Dietician

## 2018-01-11 ENCOUNTER — Encounter: Payer: Self-pay | Admitting: Dietician

## 2018-01-11 DIAGNOSIS — E7800 Pure hypercholesterolemia, unspecified: Secondary | ICD-10-CM | POA: Insufficient documentation

## 2018-01-11 DIAGNOSIS — Z713 Dietary counseling and surveillance: Secondary | ICD-10-CM | POA: Insufficient documentation

## 2018-01-11 DIAGNOSIS — E119 Type 2 diabetes mellitus without complications: Secondary | ICD-10-CM | POA: Diagnosis not present

## 2018-01-11 DIAGNOSIS — E669 Obesity, unspecified: Secondary | ICD-10-CM

## 2018-01-11 DIAGNOSIS — R030 Elevated blood-pressure reading, without diagnosis of hypertension: Secondary | ICD-10-CM | POA: Diagnosis not present

## 2018-01-11 DIAGNOSIS — Z6841 Body Mass Index (BMI) 40.0 and over, adult: Secondary | ICD-10-CM | POA: Insufficient documentation

## 2018-01-11 NOTE — Progress Notes (Signed)
Bariatric Supervised Weight Loss Visit Appt Start Time: 9:10am  End Time: 9:45am  Planned Surgery: RYGB   7th SWL Appointment   Pt completed 6 insurance-required SWL appointments and attended Pre-Op Class about 1 month ago. Per RD's clinical judgment at the 6th SWL visit, pt arrives today for another SWL visit to prepare for surgery.     NUTRITION ASSESSMENT  Anthropometrics  Start weight at [facility]: 351.1 lbs Today's weight: 346.2 lbs Weight change: +2.2 lbs since previous visit on 12/11/2017 BMI: 67.6 kg/m2    Pt states she checks BS 3x's/day, once first thing in the morning (200) and after meals (120.) Pt expressed concern over medication and insulin use surrounding surgery. Pt states she receives free vitamins through her insurance which contain 650mg  Calcium and takes 3/day. Pt states she takes a vitamin D3 supplement each day as well. Pt states a healthcare provider advised her to take a multivitamin supplement that contains iron so pt purchased some, however states she is waiting until surgery to take them. Pt states she ordered k-cups for her new Eula Fried but they are not decaf because all the flavored coffees pt likes do not have decaf options.   Pt attended Pre-Op Class about 1 month ago. At today's appointment, pt had several questions regarding the pre-op diet she will need to follow during the 2 weeks prior to surgery (not scheduled yet.) Pt expressed concern over following the diet, particularly eating primarily non-starchy vegetables and meats while limiting high fat and carbohydrate foods. Pt states she often purchases frozen meals to eat and has tried some that are listed as "acceptable" on the pre-op diet handout provided at class, however pt states she does not like them very much so she chooses others. Pt states she feels good about her progress thus far and is excited to have the surgery.   24-Hr Dietary Recall First Meal: boiled egg + sandwich meat  Snack: peanuts  Second  Meal: leftover beef stew  Snack: none stated  Third Meal: hamburger + nacho chips  Snack: none stated  Beverages: flavored water, coffee   Food & Nutrition Related Hx Dietary Hx: Pt states she is struggling to make healthy food choices. Pt states she eats when she has cravings and will eat foods such as [employer]. Pt states she will not drink plain water but will drink water with a sugar-free flavor packet.  GI / Other Notable Symptoms: none stated   Physical Activity  Current average weekly physical activity: None currently. [facility] but has not [facility] in the past week or so. Pt states she relies on other [facility] and has not been able to plan workouts due to numerous medical appointments.   Estimated Energy Needs Calories: 1800 Carbohydrate: 200g Protein: 135g Fat: 50g  Progress Towards Pre-Op Goals Previously Chosen . Pt states she has not been drinking with meals as much.  . Pt states she has not been drinking carbonated beverages.  . Pt states making healthy food choices is still difficult.  . Pt still drinks caffeinated coffee.  . Some foods pt consumes are high in fat/sugar, particularly frozen meals.     NUTRITION DIAGNOSIS  Overweight/obesity ([place]-3.3) related to past poor dietary habits and physical inactivity as evidenced by patient w/ planned RYGB surgery following dietary guidelines for continued weight loss.   NUTRITION INTERVENTION  Nutrition counseling (C-1) and education (E-2) to facilitate bariatric surgery goals.  New  Pre-Op Goals to Work On . Avoid drinking fluids 15 minutes before, during, and 30 minutes after each meal/snack.  . Avoid caffeine.  . Take calcium and multivitamin supplements daily.   Handouts Provided Include   Pre-Op Goals   Learning Style & Readiness for Change Teaching method utilized: Visual & Auditory  Demonstrated degree of understanding via: Teach Back  Barriers to  learning/adherence to lifestyle change: Contemplative stage of change.   Several times during today's apt, pt would disclose information about her dietary habits over the past month and follow with "but I don't know why I did that." For example, pt stated that when grocery shopping she will look at nutrition facts labels and see that it is too high in fat &/or sugar &/or sodium, stating she knows it is something she should limit/avoid, but will purchase it anyway. Pt stated she knows she needs to make better food choices however does not follow through. Regarding the pre-op diet pt will need to follow 2 weeks prior to surgery, pt had a difficult time understanding the need to limit high fat/ high carbohydrate foods (stating "I don't think I can do that") as well as not being able to identify examples of these foods.     MONITORING & EVALUATION Dietary intake, weekly physical activity, body weight, and pre-op goals.   Next Steps  Recommend pt return for another SWL appointment prior to surgery to better establish readiness for lifestyle change.

## 2018-01-11 NOTE — Patient Instructions (Addendum)
   During meals, do not have your drink in the room with you (or at least out of reach) to avoid drinking 15 minutes before, during, and 30 minutes after each meal/snack.   Decrease caffeine intake, eventually only decaf coffee.   Continue taking calcium and multivitamin supplements.

## 2018-01-14 DIAGNOSIS — J45901 Unspecified asthma with (acute) exacerbation: Secondary | ICD-10-CM | POA: Diagnosis not present

## 2018-01-14 DIAGNOSIS — J9601 Acute respiratory failure with hypoxia: Secondary | ICD-10-CM | POA: Diagnosis not present

## 2018-02-05 DIAGNOSIS — D509 Iron deficiency anemia, unspecified: Secondary | ICD-10-CM | POA: Diagnosis not present

## 2018-02-05 DIAGNOSIS — I10 Essential (primary) hypertension: Secondary | ICD-10-CM | POA: Diagnosis not present

## 2018-02-05 DIAGNOSIS — Z79899 Other long term (current) drug therapy: Secondary | ICD-10-CM | POA: Diagnosis not present

## 2018-02-05 DIAGNOSIS — E559 Vitamin D deficiency, unspecified: Secondary | ICD-10-CM | POA: Diagnosis not present

## 2018-02-05 DIAGNOSIS — N183 Chronic kidney disease, stage 3 (moderate): Secondary | ICD-10-CM | POA: Diagnosis not present

## 2018-02-05 DIAGNOSIS — R809 Proteinuria, unspecified: Secondary | ICD-10-CM | POA: Diagnosis not present

## 2018-02-09 ENCOUNTER — Encounter: Payer: Self-pay | Admitting: Dietician

## 2018-02-09 ENCOUNTER — Encounter: Payer: Medicare Other | Attending: General Surgery | Admitting: Dietician

## 2018-02-09 VITALS — Wt 343.5 lb

## 2018-02-09 DIAGNOSIS — Z713 Dietary counseling and surveillance: Secondary | ICD-10-CM | POA: Diagnosis not present

## 2018-02-09 DIAGNOSIS — E119 Type 2 diabetes mellitus without complications: Secondary | ICD-10-CM | POA: Diagnosis not present

## 2018-02-09 DIAGNOSIS — R030 Elevated blood-pressure reading, without diagnosis of hypertension: Secondary | ICD-10-CM | POA: Insufficient documentation

## 2018-02-09 DIAGNOSIS — E669 Obesity, unspecified: Secondary | ICD-10-CM

## 2018-02-09 DIAGNOSIS — E7800 Pure hypercholesterolemia, unspecified: Secondary | ICD-10-CM | POA: Insufficient documentation

## 2018-02-09 DIAGNOSIS — Z6841 Body Mass Index (BMI) 40.0 and over, adult: Secondary | ICD-10-CM | POA: Diagnosis not present

## 2018-02-09 NOTE — Patient Instructions (Signed)
Once surgery date is scheduled, call [facility] to schedule post-op class.

## 2018-02-09 NOTE — Progress Notes (Signed)
Bariatric Supervised Weight Loss Visit Appt Start Time: 9:15  End Time: 9:45  Planned Surgery: RYGB   8th SWL Appointments   Pt completed 6 insurance-required SWL appointments and attended Pre-Op Class about 2 months ago. Per RD's clinical judgment at the 6th SWL visit, pt returned for a 7th SWL visit about 1 month ago. Per RD's clinical judgment at the 7th SWL visit, pt arrives today for another SWL visit in order to prepare for surgery.     NUTRITION ASSESSMENT  Anthropometrics  Start weight at [facility]: 351.1 lbs Today's weight: 343.5 lbs Weight change: -3 lbs since previous visit on 01/11/18 BMI:  67 kg/m2    Per previous visit:  Pt states she checks BS 3x's/day, once first thing in the morning (200) and after meals (120.) Pt expressed concern over medication and insulin use surrounding surgery. Pt states she receives free vitamins through her insurance which contain 650mg  Calcium and takes 3/day. Pt states she takes a vitamin D3 supplement each day as well. Pt states a healthcare provider advised her to take a multivitamin supplement that contains iron so pt purchased some, however states she is waiting until surgery to take them.  Per today's visit:  Pt attended Pre-Op Class about 2 months ago. At today's appointment, pt had several questions regarding the pre-op diet she will need to follow during the 2 weeks prior to surgery (not scheduled yet.) This was reviewed at last month's SWL appointment as well as today. Pt now has a better understanding of the pre-op diet as well as the first 2 phases of the post-op diet.   24-Hr Dietary Recall First Meal: boiled egg or sausage  Snack: none Second Meal: sandwich meat (ham/turkey) + crackers Snack: jello Third Meal: meat + bread + sometimes veggies Snack: none Beverages: water, sometimes OJ (only when blood sugar is low)  Food & Nutrition Related Hx Dietary Hx: Pt states she is drinking more water and less carbonated beverages. Pt limits  coffee intake to 1 cup per day, sometimes 2. Pt states she has ordered decaf K-cups for her Eula Fried. Pt states her typical diet consists of protein foods (meat and eggs) and bread and crackers.  Supplements: Multivitamin. Pt states she bought bariatric multivitamins to have ready after surgery.  Estimated Daily Fluid Intake: 64 oz GI / Other Notable Symptoms: none  Physical Activity  Current average weekly physical activity: Walking more and more frequently. Pt states she is interested in attending [facility] program after surgery.   Estimated Energy Needs Calories: 1800 Carbohydrate: 200g Protein: 135g Fat: 50g  Progress Towards Pre-Op Goals Previously Chosen  Pt is drinking more water while limiting carbonated beverages (none anymore) and caffeine (to 1 cup per day.)   Pt is not drinking before/during/after meals, which is great progress.   Pt is chewing food thoroughly and working on eating slowly.   Pt has made progress on making better food choices as well as ability to select proper food items based on nutrition facts, such as frozen meals.   Pt is taking multivitamin supplement daily.    NUTRITION DIAGNOSIS  Overweight/obesity ([place]-3.3) related to past poor dietary habits and physical inactivity as evidenced by patient w/ planned RYGB surgery following dietary guidelines for continued weight loss.    NUTRITION INTERVENTION  Nutrition counseling (C-1) and education (E-2) to facilitate bariatric surgery goals.  Learning Style & Readiness for Change Teaching method utilized: Visual & Auditory  Demonstrated degree of understanding via: Teach Back  Barriers to learning/adherence  to lifestyle change: Planning/implementing appropriate dietary choices as post-op phases progress. While working through the pre-op dietary goals, pt has had difficulty understanding what food/beverage choices are appropriate. However, pt has more clarity on these now.    MONITORING & EVALUATION Dietary  intake, weekly physical activity, body weight, and pre-op goals until pt returns for Post-Op Class 2 weeks after surgery.   Next Steps  Pt will need to call [facility] to schedule Post-Op Class date once surgery date scheduled.

## 2018-02-12 DIAGNOSIS — N183 Chronic kidney disease, stage 3 (moderate): Secondary | ICD-10-CM | POA: Diagnosis not present

## 2018-02-12 DIAGNOSIS — E1122 Type 2 diabetes mellitus with diabetic chronic kidney disease: Secondary | ICD-10-CM | POA: Diagnosis not present

## 2018-02-12 DIAGNOSIS — E039 Hypothyroidism, unspecified: Secondary | ICD-10-CM | POA: Diagnosis not present

## 2018-02-12 DIAGNOSIS — I10 Essential (primary) hypertension: Secondary | ICD-10-CM | POA: Diagnosis not present

## 2018-02-13 DIAGNOSIS — J45901 Unspecified asthma with (acute) exacerbation: Secondary | ICD-10-CM | POA: Diagnosis not present

## 2018-02-13 DIAGNOSIS — J9601 Acute respiratory failure with hypoxia: Secondary | ICD-10-CM | POA: Diagnosis not present

## 2018-02-14 DIAGNOSIS — N183 Chronic kidney disease, stage 3 (moderate): Secondary | ICD-10-CM | POA: Diagnosis not present

## 2018-02-14 DIAGNOSIS — E559 Vitamin D deficiency, unspecified: Secondary | ICD-10-CM | POA: Diagnosis not present

## 2018-02-14 DIAGNOSIS — R809 Proteinuria, unspecified: Secondary | ICD-10-CM | POA: Diagnosis not present

## 2018-02-14 DIAGNOSIS — D638 Anemia in other chronic diseases classified elsewhere: Secondary | ICD-10-CM | POA: Diagnosis not present

## 2018-02-14 DIAGNOSIS — N2581 Secondary hyperparathyroidism of renal origin: Secondary | ICD-10-CM | POA: Diagnosis not present

## 2018-03-12 DIAGNOSIS — E1165 Type 2 diabetes mellitus with hyperglycemia: Secondary | ICD-10-CM | POA: Diagnosis not present

## 2018-03-12 DIAGNOSIS — E118 Type 2 diabetes mellitus with unspecified complications: Secondary | ICD-10-CM | POA: Diagnosis not present

## 2018-03-12 DIAGNOSIS — E785 Hyperlipidemia, unspecified: Secondary | ICD-10-CM | POA: Diagnosis not present

## 2018-03-12 DIAGNOSIS — E1022 Type 1 diabetes mellitus with diabetic chronic kidney disease: Secondary | ICD-10-CM | POA: Diagnosis not present

## 2018-03-12 DIAGNOSIS — E1122 Type 2 diabetes mellitus with diabetic chronic kidney disease: Secondary | ICD-10-CM | POA: Diagnosis not present

## 2018-03-16 DIAGNOSIS — J9601 Acute respiratory failure with hypoxia: Secondary | ICD-10-CM | POA: Diagnosis not present

## 2018-03-16 DIAGNOSIS — J45901 Unspecified asthma with (acute) exacerbation: Secondary | ICD-10-CM | POA: Diagnosis not present

## 2018-03-20 DIAGNOSIS — L609 Nail disorder, unspecified: Secondary | ICD-10-CM | POA: Diagnosis not present

## 2018-03-20 DIAGNOSIS — E1140 Type 2 diabetes mellitus with diabetic neuropathy, unspecified: Secondary | ICD-10-CM | POA: Diagnosis not present

## 2018-03-20 DIAGNOSIS — L110 Acquired keratosis follicularis: Secondary | ICD-10-CM | POA: Diagnosis not present

## 2018-04-11 ENCOUNTER — Ambulatory Visit: Payer: Self-pay | Admitting: General Surgery

## 2018-04-16 DIAGNOSIS — J45901 Unspecified asthma with (acute) exacerbation: Secondary | ICD-10-CM | POA: Diagnosis not present

## 2018-04-16 DIAGNOSIS — J9601 Acute respiratory failure with hypoxia: Secondary | ICD-10-CM | POA: Diagnosis not present

## 2018-04-17 ENCOUNTER — Telehealth: Payer: Self-pay | Admitting: Skilled Nursing Facility1

## 2018-04-17 NOTE — Telephone Encounter (Signed)
Dietitian called pt to assess their understanding of the pre-op nutrition recommendations through the teach back method to ensure the pts knowledge readiness in preparation for surgery.     

## 2018-04-17 NOTE — Telephone Encounter (Signed)
Dietitian called pt to assess their understanding of the pre-op nutrition recommendations through the teach back method to ensure the pts knowledge readiness in preparation for surgery.

## 2018-04-18 ENCOUNTER — Ambulatory Visit (INDEPENDENT_AMBULATORY_CARE_PROVIDER_SITE_OTHER): Payer: Medicare Other | Admitting: Psychiatry

## 2018-04-18 DIAGNOSIS — F509 Eating disorder, unspecified: Secondary | ICD-10-CM

## 2018-04-20 ENCOUNTER — Telehealth: Payer: Self-pay | Admitting: Cardiology

## 2018-04-20 DIAGNOSIS — I509 Heart failure, unspecified: Secondary | ICD-10-CM | POA: Diagnosis not present

## 2018-04-20 DIAGNOSIS — E119 Type 2 diabetes mellitus without complications: Secondary | ICD-10-CM | POA: Diagnosis not present

## 2018-04-20 NOTE — Telephone Encounter (Signed)
-----   Message from Mickeal Skinner, MD sent at 04/20/2018 10:26 AM EST ----- Regarding: clearance Dr. Einar Gip,  I was seeing Ms. Tomasik for preop and reviewing notes saw that the cardiology PA had deferred "clearance" to you in April, but it does not look like the patient saw you in the interim.  She has completed all other requirements and is set for gastric bypass on 3/3. I am hoping you can evaluate her or write something so anesthesia can feel confident in her cardiac status. We are also reaching out to your office, but it is currently closed.  Thank you,  Lurena Joiner

## 2018-04-23 NOTE — Telephone Encounter (Signed)
Thank you :)

## 2018-04-24 NOTE — Patient Instructions (Addendum)
[name]  04/24/2018   Your procedure is scheduled on: Tuesday 05/01/2018  Report to [facility] Main  Entrance              Report to Short Stay at 0530  AM    Call this number if you have problems the morning of surgery [phone]    Remember: Do not eat food :After  600 PM  MORNING OF SURGERY DRINK:  [place], DRINK ALL OF THE SHAKE AT ONE TIME.   NO SOLID FOOD AFTER 600 PM THE NIGHT BEFORE YOUR SURGERY. YOU MAY DRINK CLEAR FLUIDS. THE SHAKE YOU DRINK BEFORE YOU LEAVE HOME WILL BE THE LAST FLUIDS YOU DRINK BEFORE SURGERY.    CLEAR LIQUID DIET   Foods Allowed                                                                     Foods Excluded  Coffee and tea, regular and decaf                             liquids that you cannot  Plain Jell-O in any flavor                                             see through such as: Fruit ices (not with fruit pulp)                                     milk, soups, orange juice  Iced Popsicles                                    All solid food Carbonated beverages, regular and diet                                    Cranberry, grape and apple juices Sports drinks like Gatorade Lightly seasoned clear broth or consume(fat free) Sugar, honey syrup  Sample Menu Breakfast                                Lunch                                     Supper Cranberry juice                    Beef broth                            Chicken broth Jell-O  Grape juice                           Apple juice Coffee or tea                        Jell-O                                      Popsicle                                                Coffee or tea                        Coffee or tea  _____________________________________________________________________   PAIN IS EXPECTED AFTER SURGERY AND WILL NOT BE COMPLETELY ELIMINATED. AMBULATION AND TYLENOL WILL HELP  REDUCE INCISIONAL AND GAS PAIN. MOVEMENT IS KEY!  YOU ARE EXPECTED TO BE OUT OF BED WITHIN 4 HOURS OF ADMISSION TO YOUR PATIENT ROOM.  SITTING IN THE RECLINER THROUGHOUT THE DAY IS IMPORTANT FOR DRINKING FLUIDS AND MOVING GAS THROUGHOUT THE GI TRACT.  COMPRESSION STOCKINGS SHOULD BE WORN [place] UNLESS YOU ARE WALKING.   INCENTIVE SPIROMETER SHOULD BE USED EVERY HOUR WHILE AWAKE TO DECREASE POST-OPERATIVE COMPLICATIONS SUCH AS PNEUMONIA.  WHEN DISCHARGED HOME, IT IS IMPORTANT TO CONTINUE TO WALK EVERY HOUR AND USE THE INCENTIVE SPIROMETER EVERY HOUR.   How to Manage Your Diabetes Before and After Surgery  Why is it important to control my blood sugar before and after surgery? . Improving blood sugar levels before and after surgery helps healing and can limit problems. . A way of improving blood sugar control is eating a healthy diet by: o  Eating less sugar and carbohydrates o  Increasing activity/exercise o  Talking with your doctor about reaching your blood sugar goals . High blood sugars (greater than 180 mg/dL) can raise your risk of infections and slow your recovery, so you will need to focus on controlling your diabetes during the weeks before surgery. . Make sure that the doctor who takes care of your diabetes knows about your planned surgery including the date and location.  How do I manage my blood sugar before surgery? . Check your blood sugar at least 4 times a day, starting 2 days before surgery, to make sure that the level is not too high or low. o Check your blood sugar the morning of your surgery when you wake up and every 2 hours until you get to the Short Stay unit. . If your blood sugar is less than 70 mg/dL, you will need to treat for low blood sugar: o Do not take insulin. o Treat a low blood sugar (less than 70 mg/dL) with  cup of clear juice (cranberry or apple), 4 glucose tablets, OR glucose gel. o Recheck blood sugar in 15 minutes after  treatment (to make sure it is greater than 70 mg/dL). If your blood sugar is not greater than 70 mg/dL on recheck, call [phone] for further instructions. . Report your blood sugar to the short stay nurse when you get to Short Stay.  . If you are admitted to the hospital after surgery: o Your  blood sugar will be checked by the staff and you will probably be given insulin after surgery (instead of oral diabetes medicines) to make sure you have good blood sugar levels. o The goal for blood sugar control after surgery is 80-180 mg/dL.   WHAT DO I DO ABOUT MY DIABETES MEDICATION?        The day before surgery for the morning dose of Insulin Humulin 70/30 (70/30), take the usual amount of 62 units.        The day before surgery , take Victoza as usual.  . Do not take oral diabetes medicines (pills) the morning of surgery.!  . THE NIGHT BEFORE SURGERY, take 42    units of   Humulin 70/30 kwickpen (70/30)    insulin.       . THE MORNING OF SURGERY, take  0  units of    Humulin 70/30 kwikpen (70/30)   Insulin. .   . The day of surgery, do not take other diabetes injectables, including Byetta (exenatide), Bydureon (exenatide ER), Victoza (liraglutide), or Trulicity (dulaglutide). .               BRUSH YOUR TEETH MORNING OF SURGERY AND RINSE YOUR MOUTH OUT, NO CHEWING GUM CANDY OR MINTS.     Take these medicines the morning of surgery with A SIP OF WATER: Carvedilol (Coreg),Gabapentin (Neurontin), Levothyroxine (Synthroid), Montelukast (Singulair), use Albuterol inhaler if needed and use Albuterol nebulizerif needed and use Advair inhaler and bring inhalers with you to the hospital               DO NOT [place]!                               You may not have any metal on your body including hair pins and              piercings  Do not wear jewelry, make-up, lotions, powders or perfumes, deodorant             Do not wear nail polish.  Do not shave   48 hours prior to surgery.                 Do not bring valuables to the hospital. [place].  Contacts, dentures or bridgework may not be worn into surgery.  Leave suitcase in the car. After surgery it may be brought to your room.                   Please read over the following fact sheets you were given: _____________________________________________________________________             [facility] - Preparing for Surgery Before surgery, you can play an important role.  Because skin is not sterile, your skin needs to be as free of germs as possible.  You can reduce the number of germs on your skin by washing with CHG (chlorahexidine gluconate) soap before surgery.  CHG is an antiseptic cleaner which kills germs and bonds with the skin to continue killing germs even after washing. Please DO NOT use if you have an allergy to CHG or antibacterial soaps.  If your skin becomes reddened/irritated stop using the CHG and inform your nurse when you arrive at Short Stay. Do not  shave (including legs and underarms) for at least 48 hours prior to the first CHG shower.  You may shave your face/neck. Please follow these instructions carefully:  1.  Shower with CHG Soap the night before surgery and the  morning of Surgery.  2.  If you choose to wash your hair, wash your hair first as usual with your  normal  shampoo.  3.  After you shampoo, rinse your hair and body thoroughly to remove the  shampoo.                           4.  Use CHG as you would any other liquid soap.  You can apply chg directly  to the skin and wash                       Gently with a scrungie or clean washcloth.  5.  Apply the CHG Soap to your body ONLY FROM THE NECK DOWN.   Do not use on face/ open                           Wound or open sores. Avoid contact with eyes, ears mouth and genitals (private parts).                       Wash face,  Genitals (private parts) with your  normal soap.             6.  Wash thoroughly, paying special attention to the area where your surgery  will be performed.  7.  Thoroughly rinse your body with warm water from the neck down.  8.  DO NOT shower/wash with your normal soap after using and rinsing off  the CHG Soap.                9.  Pat yourself dry with a clean towel.            10.  Wear clean pajamas.            11.  Place clean sheets on your bed the night of your first shower and do not  sleep with pets. Day of Surgery : Do not apply any lotions/deodorants the morning of surgery.  Please wear clean clothes to the hospital/surgery center.  FAILURE TO FOLLOW THESE INSTRUCTIONS MAY RESULT IN THE CANCELLATION OF YOUR SURGERY PATIENT SIGNATURE_________________________________  NURSE SIGNATURE__________________________________  ________________________________________________________________________   Adam Phenix  An incentive spirometer is a tool that can help keep your lungs clear and active. This tool measures how well you are filling your lungs with each breath. Taking long deep breaths may help reverse or decrease the chance of developing breathing (pulmonary) problems (especially infection) following:  A long period of time when you are unable to move or be active. BEFORE THE PROCEDURE   If the spirometer includes an indicator to show your best effort, your nurse or respiratory therapist will set it to a desired goal.  If possible, sit up straight or lean slightly forward. Try not to slouch.  Hold the incentive spirometer in an upright position. INSTRUCTIONS FOR USE  1. Sit on the edge of your bed if possible, or sit up as far as you can in bed or on a chair. 2. Hold the incentive spirometer in an upright position. 3. Breathe out normally. 4. Place the mouthpiece in your mouth  and seal your lips tightly around it. 5. Breathe in slowly and as deeply as possible, raising the piston or the ball toward the  top of the column. 6. Hold your breath for 3-5 seconds or for as long as possible. Allow the piston or ball to fall to the bottom of the column. 7. Remove the mouthpiece from your mouth and breathe out normally. 8. Rest for a few seconds and repeat Steps 1 through 7 at least 10 times every 1-2 hours when you are awake. Take your time and take a few normal breaths between deep breaths. 9. The spirometer may include an indicator to show your best effort. Use the indicator as a goal to work toward during each repetition. 10. After each set of 10 deep breaths, practice coughing to be sure your lungs are clear. If you have an incision (the cut made at the time of surgery), support your incision when coughing by placing a pillow or rolled up towels firmly against it. Once you are able to get out of bed, walk around indoors and cough well. You may stop using the incentive spirometer when instructed by your caregiver.  RISKS AND COMPLICATIONS  Take your time so you do not get dizzy or light-headed.  If you are in pain, you may need to take or ask for pain medication before doing incentive spirometry. It is harder to take a deep breath if you are having pain. AFTER USE  Rest and breathe slowly and easily.  It can be helpful to keep track of a log of your progress. Your caregiver can provide you with a simple table to help with this. If you are using the spirometer at home, follow these instructions: [place] IF:   You are having difficultly using the spirometer.  You have trouble using the spirometer as often as instructed.  Your pain medication is not giving enough relief while using the spirometer.  You develop fever of 100.5 F (38.1 C) or higher. SEEK IMMEDIATE MEDICAL CARE IF:   You cough up bloody sputum that had not been present before.  You develop fever of 102 F (38.9 C) or greater.  You develop worsening pain at or near the incision site. MAKE SURE YOU:   Understand  these instructions.  Will watch your condition.  Will get help right away if you are not doing well or get worse. Document Released: 06/27/2006 Document Revised: 05/09/2011 Document Reviewed: 08/28/2006 ExitCare Patient Information 2014 Exit[employer], [place].   ________________________________________________________________________  WHAT IS A BLOOD TRANSFUSION? Blood Transfusion Information  A transfusion is the replacement of blood or some of its parts. Blood is made up of multiple cells which provide different functions.  Red blood cells carry oxygen and are used for blood loss replacement.  White blood cells fight against infection.  Platelets control bleeding.  Plasma helps clot blood.  Other blood products are available for specialized needs, such as hemophilia or other clotting disorders. BEFORE THE TRANSFUSION  Who gives blood for transfusions?   Healthy volunteers who are fully evaluated to make sure their blood is safe. This is blood bank blood. Transfusion therapy is the safest it has ever been in the practice of medicine. Before blood is taken from a donor, a complete history is taken to make sure that person has no history of diseases nor engages in risky social behavior (examples are intravenous drug use or sexual activity with multiple partners). The donor's travel history is screened to minimize risk of transmitting  infections, such as malaria. The donated blood is tested for signs of infectious diseases, such as HIV and hepatitis. The blood is then tested to be sure it is compatible with you in order to minimize the chance of a transfusion reaction. If you or a relative donates blood, this is often done in anticipation of surgery and is not appropriate for emergency situations. It takes many days to process the donated blood. RISKS AND COMPLICATIONS Although transfusion therapy is very safe and saves many lives, the main dangers of transfusion include:   Getting an  infectious disease.  Developing a transfusion reaction. This is an allergic reaction to something in the blood you were given. Every precaution is taken to prevent this. The decision to have a blood transfusion has been considered carefully by your caregiver before blood is given. Blood is not given unless the benefits outweigh the risks. AFTER THE TRANSFUSION  Right after receiving a blood transfusion, you will usually feel much better and more energetic. This is especially true if your red blood cells have gotten low (anemic). The transfusion raises the level of the red blood cells which carry oxygen, and this usually causes an energy increase.  The nurse administering the transfusion will monitor you carefully for complications. HOME CARE INSTRUCTIONS  No special instructions are needed after a transfusion. You may find your energy is better. Speak with your caregiver about any limitations on activity for underlying diseases you may have. SEEK MEDICAL CARE IF:   Your condition is not improving after your transfusion.  You develop redness or irritation at the intravenous (IV) site. SEEK IMMEDIATE MEDICAL CARE IF:  Any of the following symptoms occur over the next 12 hours:  Shaking chills.  You have a temperature by mouth above 102 F (38.9 C), not controlled by medicine.  Chest, back, or muscle pain.  People around you feel you are not acting correctly or are confused.  Shortness of breath or difficulty breathing.  Dizziness and fainting.  You get a rash or develop hives.  You have a decrease in urine output.  Your urine turns a dark color or changes to pink, red, or brown. Any of the following symptoms occur over the next 10 days:  You have a temperature by mouth above 102 F (38.9 C), not controlled by medicine.  Shortness of breath.  Weakness after normal activity.  The white part of the eye turns yellow (jaundice).  You have a decrease in the amount of urine or  are urinating less often.  Your urine turns a dark color or changes to pink, red, or brown. Document Released: 02/12/2000 Document Revised: 05/09/2011 Document Reviewed: 10/01/2007 [facility] Patient Information 2014 [place], [place].  _______________________________________________________________________

## 2018-04-25 ENCOUNTER — Other Ambulatory Visit: Payer: Self-pay

## 2018-04-25 ENCOUNTER — Encounter (HOSPITAL_COMMUNITY): Payer: Self-pay

## 2018-04-25 ENCOUNTER — Encounter (HOSPITAL_COMMUNITY)
Admission: RE | Admit: 2018-04-25 | Discharge: 2018-04-25 | Disposition: A | Discharge status: Home / Self Care | Payer: Medicare Other | Source: Ambulatory Visit | Attending: General Surgery | Admitting: General Surgery

## 2018-04-25 DIAGNOSIS — Z01812 Encounter for preprocedural laboratory examination: Secondary | ICD-10-CM | POA: Diagnosis not present

## 2018-04-25 LAB — CBC WITH DIFFERENTIAL/PLATELET
Abs Immature Granulocytes: 0.04 10*3/uL (ref 0.00–0.07)
Basophils Absolute: 0 10*3/uL (ref 0.0–0.1)
Basophils Relative: 0 %
EOS ABS: 0.2 10*3/uL (ref 0.0–0.5)
EOS PCT: 2 %
HCT: 37.2 % (ref 36.0–46.0)
Hemoglobin: 11.2 g/dL — ABNORMAL LOW (ref 12.0–15.0)
Immature Granulocytes: 0 %
Lymphocytes Relative: 22 %
Lymphs Abs: 2.3 10*3/uL (ref 0.7–4.0)
MCH: 26.7 pg (ref 26.0–34.0)
MCHC: 30.1 g/dL (ref 30.0–36.0)
MCV: 88.8 fL (ref 80.0–100.0)
Monocytes Absolute: 0.8 10*3/uL (ref 0.1–1.0)
Monocytes Relative: 7 %
Neutro Abs: 7.2 10*3/uL (ref 1.7–7.7)
Neutrophils Relative %: 69 %
Platelets: 227 10*3/uL (ref 150–400)
RBC: 4.19 MIL/uL (ref 3.87–5.11)
RDW: 13.7 % (ref 11.5–15.5)
WBC: 10.6 10*3/uL — ABNORMAL HIGH (ref 4.0–10.5)
nRBC: 0 % (ref 0.0–0.2)

## 2018-04-25 LAB — COMPREHENSIVE METABOLIC PANEL
ALT: 14 U/L (ref 0–44)
AST: 15 U/L (ref 15–41)
Albumin: 3.9 g/dL (ref 3.5–5.0)
Alkaline Phosphatase: 83 U/L (ref 38–126)
Anion gap: 11 (ref 5–15)
BUN: 71 mg/dL — ABNORMAL HIGH (ref 6–20)
CO2: 26 mmol/L (ref 22–32)
Calcium: 9.4 mg/dL (ref 8.9–10.3)
Chloride: 103 mmol/L (ref 98–111)
Creatinine, Ser: 1.79 mg/dL — ABNORMAL HIGH (ref 0.44–1.00)
GFR calc Af Amer: 37 mL/min — ABNORMAL LOW (ref >60)
GFR calc non Af Amer: 32 mL/min — ABNORMAL LOW (ref >60)
Glucose, Bld: 141 mg/dL — ABNORMAL HIGH (ref 70–99)
Potassium: 4 mmol/L (ref 3.5–5.1)
Sodium: 140 mmol/L (ref 135–145)
TOTAL PROTEIN: 7.7 g/dL (ref 6.5–8.1)
Total Bilirubin: 0.3 mg/dL (ref 0.3–1.2)

## 2018-04-25 LAB — ABO/RH: ABO/RH(D): A POS

## 2018-04-25 LAB — HEMOGLOBIN A1C
Hgb A1c MFr Bld: 6.7 % — ABNORMAL HIGH (ref 4.8–5.6)
Mean Plasma Glucose: 145.59 mg/dL

## 2018-04-25 LAB — GLUCOSE, CAPILLARY: Glucose-Capillary: 135 mg/dL — ABNORMAL HIGH (ref 70–99)

## 2018-04-25 MED ORDER — ENSURE PRE-SURGERY PO LIQD: 296.0000 mL | Freq: Once | ORAL | Status: DC

## 2018-04-25 NOTE — Progress Notes (Addendum)
04/20/2018- note on chart for Cardiac Clearance from Dr. Einar Gip.  05/10/2017- noted in Epic- EKG , CXR and DG UGI w/ KUB  05/07/2014- noted in Epic- ECHO

## 2018-04-25 NOTE — Progress Notes (Signed)
Chart given to [place], [place] , Anesthesia to review lab work from today.

## 2018-04-25 NOTE — Progress Notes (Signed)
   04/25/18 1344  OBSTRUCTIVE SLEEP APNEA  Have you ever been diagnosed with sleep apnea through a sleep study? No  Do you snore loudly (loud enough to be heard through closed doors)?  1  Do you often feel tired, fatigued, or sleepy during the daytime (such as falling asleep during driving or talking to someone)? 0  Has anyone observed you stop breathing during your sleep? 0  Do you have, or are you being treated for high blood pressure? 1  BMI more than 35 kg/m2? 1  Age > 50 (1-yes) 1  Neck circumference greater than:Female 16 inches or larger, Female 17inches or larger? 1  Female Gender (Yes=1) 0  Obstructive Sleep Apnea Score 5  Score 5 or greater  Results sent to PCP

## 2018-04-26 NOTE — Progress Notes (Signed)
Anesthesia Chart Review   Case:  [id number] Date/Time:  05/01/18 0700   Procedure:  LAPAROSCOPIC ROUX-EN-Y GASTRIC BYPASS WITH UPPER ENDOSCOPY WITH ERAS PATHWAY (N/A )   Anesthesia type:  General   Pre-op diagnosis:  MORBID OBESITY   Location:  [place] [facility] / [facility] ORS   Surgeon:  Mickeal Skinner, MD      DISCUSSION: 53 yo never smoker with h/o asthma, DM II, HTN, HLD, CHF, CKD (followed by nephrology), morbid obesity scheduled for above procedure 05/01/18 with Dr. Gurney Maxin.   Pt last seen by cardiologist, Dr. Adrian Prows, February 2019.  Per note, "Coronary angiogram 04/09/2013: Normal coronary arteries, normal left ventricle systolic function. Markedly elevated LVEDP of 31 mmHg. EKG 11/10/2016: Normal sinus rhythm at rate of 84 bpm, borderline criteria for left atrial enlargement, no evidence of ischemia. Normal EKG. She has been stable and I last saw her in Feb 2019. She should proceed with surgery with low risk from Cardiac standpoint."  Creatinine 1.79 at PAT visit 04/25/18.  This appears to be stable, Creatinine range 1.57-1.79.    Pt can proceed with planned procedure barring acute status change.  VS: BP (!) 151/68 (BP Location: Left Arm) Comment: Patient lower arm - Notified RN  Pulse 97   Temp 36.6 C (Oral)   Resp 18   Ht 5' (1.524 m)   Wt (!) 153.9 kg   LMP 09/11/2013   SpO2 98%   BMI 66.28 kg/m   PROVIDERS: Iona Beard, MD is PCP   Adrian Prows, MD is Cardiologist   Peri Maris, MD is nephrologist  LABS: Labs reviewed: Acceptable for surgery. (all labs ordered are listed, but only abnormal results are displayed)  Labs Reviewed  GLUCOSE, CAPILLARY - Abnormal; Notable for the following components:      Result Value   Glucose-Capillary 135 (*)    All other components within normal limits  CBC WITH DIFFERENTIAL/PLATELET - Abnormal; Notable for the following components:   WBC 10.6 (*)    Hemoglobin 11.2 (*)    All other components within normal limits   COMPREHENSIVE METABOLIC PANEL - Abnormal; Notable for the following components:   Glucose, Bld 141 (*)    BUN 71 (*)    Creatinine, Ser 1.79 (*)    GFR calc non Af Amer 32 (*)    GFR calc Af Amer 37 (*)    All other components within normal limits  HEMOGLOBIN A1C - Abnormal; Notable for the following components:   Hgb A1c MFr Bld 6.7 (*)    All other components within normal limits  TYPE AND SCREEN  ABO/RH     IMAGES:   EKG: 05/10/2017 Rate 69 bpm Normal sinus rhythm  Normal ECG No significant change since last tracing   CV: Echo 05/07/2014 Study Conclusions  - Left ventricle: The cavity size was normal. Wall thickness was increased in a pattern of moderate LVH. Systolic function was vigorous. The estimated ejection fraction was in the range of 70% to 75%. Wall motion was normal; there were no regional wall motion abnormalities. Features are consistent with a pseudonormal left ventricular filling pattern, with concomitant abnormal relaxation and increased filling pressure (grade 2 diastolic dysfunction). - Aortic valve: Poorly visualized. Mildly calcified annulus. There was no significant regurgitation. - Mitral valve: Calcified annulus. - Left atrium: The atrium was moderately dilated. - Right atrium: The atrium was mildly dilated. Central venous pressure (est): 15 mm Hg. - Tricuspid valve: There was trivial regurgitation. - Pulmonary arteries: Systolic  pressure could not be accurately estimated. - Pericardium, extracardiac: A small pericardial effusion was identified circumferential to the heart. Past Medical History:  Diagnosis Date  . ARF (acute renal failure) ([place]) 05/03/2014  . Asthma   . Asthma exacerbation 05/03/2014  . Asthma, severe persistent 05/03/2014  . CHF (congestive heart failure) ([place])   . Diabetes mellitus   . Diabetes mellitus type 2 in obese ([place]) 05/03/2014  . History of echocardiogram 05/1935   LVH, diastolic dysfunction   . Hyperlipidemia   . Hypertension   . Hyponatremia 12/02/2014  . Obesity   . SOB (shortness of breath) 12/22/2014    Past Surgical History:  Procedure Laterality Date  . CARDIAC CATHETERIZATION  03/2013   normal coronary arteries, EF 55%  . CARDIAC CATHETERIZATION  03/2013   normal coronary arteries  . CATARACT EXTRACTION W/PHACO Right 12/10/2012   Procedure: CATARACT EXTRACTION PHACO AND INTRAOCULAR LENS PLACEMENT ([place]);  Surgeon: Tonny Branch, MD;  Location: [facility];  Service: Ophthalmology;  Laterality: Right;  CDE:22..42  . CATARACT EXTRACTION W/PHACO Left 05/11/2015   Procedure: CATARACT EXTRACTION PHACO AND INTRAOCULAR LENS PLACEMENT LEFT EYE CDE=6.54;  Surgeon: Tonny Branch, MD;  Location: [facility];  Service: Ophthalmology;  Laterality: Left;  . COLONOSCOPY N/A 09/22/2017   Procedure: COLONOSCOPY;  Surgeon: Danie Binder, MD;  Location: [facility] ENDO SUITE;  Service: Endoscopy;  Laterality: N/A;  12:15  . EYE SURGERY Left   . LEFT HEART CATHETERIZATION WITH CORONARY ANGIOGRAM N/A 04/09/2013   Procedure: LEFT HEART CATHETERIZATION WITH CORONARY ANGIOGRAM;  Surgeon: Laverda Page, MD;  Location: [facility] CATH LAB;  Service: Cardiovascular;  Laterality: N/A;  . TRACHEOSTOMY     at age 34 from asthma attack    MEDICATIONS: . OXYGEN  . acetaminophen (TYLENOL) 500 MG tablet  . albuterol (PROVENTIL HFA;VENTOLIN HFA) 108 (90 BASE) MCG/ACT inhaler  . albuterol (PROVENTIL) (2.5 MG/3ML) 0.083% nebulizer solution  . aspirin EC 81 MG tablet  . atorvastatin (LIPITOR) 20 MG tablet  . Calcium Carbonate-Vitamin D (CALCIUM 600+D PO)  . carboxymethylcellulose (REFRESH PLUS) 0.5 % SOLN  . carvedilol (COREG) 6.25 MG tablet  . Cholecalciferol (VITAMIN D) 2000 units tablet  . Fluticasone-Salmeterol (ADVAIR) 100-50 MCG/DOSE AEPB  . gabapentin (NEURONTIN) 300 MG capsule  . Insulin Isophane & Regular Human (HUMULIN 70/30 KWIKPEN) (70-30) 100 UNIT/ML PEN  . levothyroxine (SYNTHROID, LEVOTHROID) 75 MCG tablet  .  liraglutide (VICTOZA) 18 MG/3ML SOPN  . lisinopril (PRINIVIL,ZESTRIL) 2.5 MG tablet  . montelukast (SINGULAIR) 10 MG tablet  . pantoprazole (PROTONIX) 20 MG tablet  . torsemide (DEMADEX) 20 MG tablet   No current facility-administered medications for this encounter.     Maia Plan [facility] Pre-Surgical Testing [phone] 04/26/18 2:30 PM

## 2018-04-26 NOTE — Anesthesia Preprocedure Evaluation (Addendum)
Anesthesia Evaluation  Patient identified by MRN, date of birth, ID band Patient awake    Reviewed: Allergy & Precautions, NPO status , Patient's Chart, lab work & pertinent test results  Airway Mallampati: II  TM Distance: >3 FB Neck ROM: Full    Dental  (+) Teeth Intact, Dental Advisory Given,    Pulmonary    breath sounds clear to auscultation       Cardiovascular hypertension,  Rhythm:Regular Rate:Normal     Neuro/Psych    GI/Hepatic   Endo/Other  diabetes  Renal/GU      Musculoskeletal   Abdominal (+) + obese,   Peds  Hematology   Anesthesia Other Findings   Reproductive/Obstetrics                            Anesthesia Physical Anesthesia Plan  ASA: III  Anesthesia Plan: General   Post-op Pain Management:    Induction: Intravenous and Rapid sequence  PONV Risk Score and Plan: Ondansetron and Scopolamine patch - Pre-op  Airway Management Planned: Oral ETT and Video Laryngoscope Planned  Additional Equipment:   Intra-op Plan:   Post-operative Plan: Possible Post-op intubation/ventilation  Informed Consent: I have reviewed the patients History and Physical, chart, labs and discussed the procedure including the risks, benefits and alternatives for the proposed anesthesia with the patient or authorized representative who has indicated his/her understanding and acceptance.     Dental advisory given  Plan Discussed with: CRNA and Anesthesiologist  Anesthesia Plan Comments: (See PAT note 04/25/18, Konrad Felix, PA-C)       Anesthesia Quick Evaluation

## 2018-04-30 ENCOUNTER — Telehealth: Payer: Self-pay | Admitting: Skilled Nursing Facility1

## 2018-04-30 MED ORDER — BUPIVACAINE LIPOSOME 1.3 % IJ SUSP
20.0000 mL | Freq: Once | INTRAMUSCULAR | Status: DC
  Filled 2018-04-30: qty 20

## 2018-04-30 NOTE — Telephone Encounter (Signed)
Dietitian called pt to assess their understanding of the pre-op nutrition recommendations through the teach back method to ensure the pts knowledge readiness in preparation for surgery.    LVM 

## 2018-05-01 ENCOUNTER — Inpatient Hospital Stay (HOSPITAL_COMMUNITY): Payer: Medicare Other | Admitting: Physician Assistant

## 2018-05-01 ENCOUNTER — Other Ambulatory Visit: Payer: Self-pay

## 2018-05-01 ENCOUNTER — Encounter (HOSPITAL_COMMUNITY)
Admission: RE | Disposition: A | Discharge status: Home / Self Care | Payer: Self-pay | Source: Home / Self Care | Attending: General Surgery

## 2018-05-01 ENCOUNTER — Encounter (HOSPITAL_COMMUNITY): Payer: Self-pay

## 2018-05-01 ENCOUNTER — Inpatient Hospital Stay (HOSPITAL_COMMUNITY): Payer: Medicare Other | Admitting: Anesthesiology

## 2018-05-01 ENCOUNTER — Inpatient Hospital Stay (HOSPITAL_COMMUNITY)
Admission: RE | Admit: 2018-05-01 | Discharge: 2018-05-02 | DRG: 620 | Disposition: A | Discharge status: Home / Self Care | Payer: Medicare Other | Attending: General Surgery | Admitting: General Surgery

## 2018-05-01 DIAGNOSIS — Z7951 Long term (current) use of inhaled steroids: Secondary | ICD-10-CM

## 2018-05-01 DIAGNOSIS — E1122 Type 2 diabetes mellitus with diabetic chronic kidney disease: Secondary | ICD-10-CM | POA: Diagnosis not present

## 2018-05-01 DIAGNOSIS — I130 Hypertensive heart and chronic kidney disease with heart failure and stage 1 through stage 4 chronic kidney disease, or unspecified chronic kidney disease: Secondary | ICD-10-CM | POA: Diagnosis not present

## 2018-05-01 DIAGNOSIS — E669 Obesity, unspecified: Secondary | ICD-10-CM | POA: Diagnosis present

## 2018-05-01 DIAGNOSIS — N183 Chronic kidney disease, stage 3 (moderate): Secondary | ICD-10-CM | POA: Diagnosis not present

## 2018-05-01 DIAGNOSIS — Z6841 Body Mass Index (BMI) 40.0 and over, adult: Secondary | ICD-10-CM

## 2018-05-01 DIAGNOSIS — Z7982 Long term (current) use of aspirin: Secondary | ICD-10-CM | POA: Diagnosis not present

## 2018-05-01 DIAGNOSIS — E785 Hyperlipidemia, unspecified: Secondary | ICD-10-CM | POA: Diagnosis present

## 2018-05-01 DIAGNOSIS — I129 Hypertensive chronic kidney disease with stage 1 through stage 4 chronic kidney disease, or unspecified chronic kidney disease: Secondary | ICD-10-CM | POA: Diagnosis not present

## 2018-05-01 DIAGNOSIS — Z794 Long term (current) use of insulin: Secondary | ICD-10-CM | POA: Diagnosis not present

## 2018-05-01 DIAGNOSIS — Z9841 Cataract extraction status, right eye: Secondary | ICD-10-CM

## 2018-05-01 DIAGNOSIS — Z8249 Family history of ischemic heart disease and other diseases of the circulatory system: Secondary | ICD-10-CM | POA: Diagnosis not present

## 2018-05-01 DIAGNOSIS — Z825 Family history of asthma and other chronic lower respiratory diseases: Secondary | ICD-10-CM

## 2018-05-01 DIAGNOSIS — Z79899 Other long term (current) drug therapy: Secondary | ICD-10-CM | POA: Diagnosis not present

## 2018-05-01 DIAGNOSIS — Z79890 Hormone replacement therapy (postmenopausal): Secondary | ICD-10-CM

## 2018-05-01 DIAGNOSIS — Z833 Family history of diabetes mellitus: Secondary | ICD-10-CM | POA: Diagnosis not present

## 2018-05-01 DIAGNOSIS — J45909 Unspecified asthma, uncomplicated: Secondary | ICD-10-CM | POA: Diagnosis present

## 2018-05-01 DIAGNOSIS — Z9842 Cataract extraction status, left eye: Secondary | ICD-10-CM

## 2018-05-01 DIAGNOSIS — Z961 Presence of intraocular lens: Secondary | ICD-10-CM | POA: Diagnosis present

## 2018-05-01 DIAGNOSIS — Z9981 Dependence on supplemental oxygen: Secondary | ICD-10-CM | POA: Diagnosis not present

## 2018-05-01 DIAGNOSIS — E119 Type 2 diabetes mellitus without complications: Secondary | ICD-10-CM | POA: Diagnosis not present

## 2018-05-01 DIAGNOSIS — I5032 Chronic diastolic (congestive) heart failure: Secondary | ICD-10-CM | POA: Diagnosis not present

## 2018-05-01 DIAGNOSIS — I10 Essential (primary) hypertension: Secondary | ICD-10-CM | POA: Diagnosis not present

## 2018-05-01 HISTORY — PX: GASTRIC ROUX-EN-Y: SHX5262

## 2018-05-01 HISTORY — PX: GASTRIC BYPASS: SHX52

## 2018-05-01 LAB — GLUCOSE, CAPILLARY
Glucose-Capillary: 144 mg/dL — ABNORMAL HIGH (ref 70–99)
Glucose-Capillary: 156 mg/dL — ABNORMAL HIGH (ref 70–99)
Glucose-Capillary: 179 mg/dL — ABNORMAL HIGH (ref 70–99)
Glucose-Capillary: 210 mg/dL — ABNORMAL HIGH (ref 70–99)
Glucose-Capillary: 227 mg/dL — ABNORMAL HIGH (ref 70–99)
Glucose-Capillary: 263 mg/dL — ABNORMAL HIGH (ref 70–99)
Glucose-Capillary: 268 mg/dL — ABNORMAL HIGH (ref 70–99)

## 2018-05-01 LAB — TYPE AND SCREEN
ABO/RH(D): A POS
Antibody Screen: NEGATIVE

## 2018-05-01 LAB — HEMOGLOBIN AND HEMATOCRIT, BLOOD
HCT: 35.6 % — ABNORMAL LOW (ref 36.0–46.0)
Hemoglobin: 10.7 g/dL — ABNORMAL LOW (ref 12.0–15.0)

## 2018-05-01 SURGERY — LAPAROSCOPIC ROUX-EN-Y GASTRIC BYPASS WITH UPPER ENDOSCOPY
Anesthesia: General

## 2018-05-01 MED ORDER — OXYCODONE HCL 5 MG/5ML PO SOLN
5.0000 mg | ORAL | Status: DC | PRN
  Administered 2018-05-01 – 2018-05-02 (×2): 5 mg via ORAL
  Filled 2018-05-01 (×2): qty 5

## 2018-05-01 MED ORDER — ONDANSETRON HCL 4 MG/2ML IJ SOLN: 4.0000 mg | Freq: Once | INTRAMUSCULAR | Status: DC | PRN

## 2018-05-01 MED ORDER — FENTANYL CITRATE (PF) 100 MCG/2ML IJ SOLN
INTRAMUSCULAR | Status: AC
  Administered 2018-05-01: 25 ug via INTRAVENOUS
  Filled 2018-05-01: qty 2

## 2018-05-01 MED ORDER — SIMETHICONE 80 MG PO CHEW: 80.0000 mg | CHEWABLE_TABLET | Freq: Four times a day (QID) | ORAL | Status: DC | PRN

## 2018-05-01 MED ORDER — ENOXAPARIN SODIUM 30 MG/0.3ML ~~LOC~~ SOLN
30.0000 mg | Freq: Two times a day (BID) | SUBCUTANEOUS | Status: DC
  Administered 2018-05-01 – 2018-05-02 (×2): 30 mg via SUBCUTANEOUS
  Filled 2018-05-01 (×2): qty 0.3

## 2018-05-01 MED ORDER — DEXAMETHASONE SODIUM PHOSPHATE 4 MG/ML IJ SOLN: 4.0000 mg | INTRAMUSCULAR | Status: DC

## 2018-05-01 MED ORDER — GABAPENTIN 100 MG PO CAPS
200.0000 mg | ORAL_CAPSULE | Freq: Two times a day (BID) | ORAL | Status: DC
  Administered 2018-05-01 – 2018-05-02 (×2): 200 mg via ORAL
  Filled 2018-05-01 (×2): qty 2

## 2018-05-01 MED ORDER — PROPOFOL 10 MG/ML IV BOLUS
INTRAVENOUS | Status: AC
  Filled 2018-05-01: qty 20

## 2018-05-01 MED ORDER — BUPIVACAINE HCL 0.25 % IJ SOLN
INTRAMUSCULAR | Status: DC | PRN
  Administered 2018-05-01: 30 mL

## 2018-05-01 MED ORDER — DEXAMETHASONE SODIUM PHOSPHATE 10 MG/ML IJ SOLN
INTRAMUSCULAR | Status: DC | PRN
  Administered 2018-05-01: 4 mg via INTRAVENOUS

## 2018-05-01 MED ORDER — DEXAMETHASONE SODIUM PHOSPHATE 10 MG/ML IJ SOLN
INTRAMUSCULAR | Status: AC
  Filled 2018-05-01: qty 1

## 2018-05-01 MED ORDER — DEXTROSE-NACL 5-0.45 % IV SOLN
INTRAVENOUS | Status: DC
  Administered 2018-05-01 (×2): via INTRAVENOUS

## 2018-05-01 MED ORDER — ALBUTEROL SULFATE (2.5 MG/3ML) 0.083% IN NEBU: 2.5000 mg | INHALATION_SOLUTION | Freq: Four times a day (QID) | RESPIRATORY_TRACT | Status: DC | PRN

## 2018-05-01 MED ORDER — LACTATED RINGERS IV SOLN
INTRAVENOUS | Status: DC
  Administered 2018-05-01: 06:00:00 via INTRAVENOUS

## 2018-05-01 MED ORDER — KETAMINE HCL 10 MG/ML IJ SOLN
INTRAMUSCULAR | Status: DC | PRN
  Administered 2018-05-01: 40 mg via INTRAVENOUS

## 2018-05-01 MED ORDER — SUGAMMADEX SODIUM 200 MG/2ML IV SOLN
INTRAVENOUS | Status: DC | PRN
  Administered 2018-05-01: 500 mg via INTRAVENOUS

## 2018-05-01 MED ORDER — ACETAMINOPHEN 500 MG PO TABS
1000.0000 mg | ORAL_TABLET | ORAL | Status: AC
  Administered 2018-05-01: 1000 mg via ORAL
  Filled 2018-05-01: qty 2

## 2018-05-01 MED ORDER — PHENYLEPHRINE 40 MCG/ML (10ML) SYRINGE FOR IV PUSH (FOR BLOOD PRESSURE SUPPORT)
PREFILLED_SYRINGE | INTRAVENOUS | Status: DC | PRN
  Administered 2018-05-01 (×7): 80 ug via INTRAVENOUS

## 2018-05-01 MED ORDER — ALBUTEROL SULFATE HFA 108 (90 BASE) MCG/ACT IN AERS
INHALATION_SPRAY | RESPIRATORY_TRACT | Status: DC | PRN
  Administered 2018-05-01 (×2): 3 via RESPIRATORY_TRACT

## 2018-05-01 MED ORDER — ROCURONIUM BROMIDE 10 MG/ML (PF) SYRINGE
PREFILLED_SYRINGE | INTRAVENOUS | Status: DC | PRN
  Administered 2018-05-01: 20 mg via INTRAVENOUS
  Administered 2018-05-01: 50 mg via INTRAVENOUS

## 2018-05-01 MED ORDER — HEPARIN SODIUM (PORCINE) 5000 UNIT/ML IJ SOLN
5000.0000 [IU] | INTRAMUSCULAR | Status: AC
  Administered 2018-05-01: 5000 [IU] via SUBCUTANEOUS
  Filled 2018-05-01: qty 1

## 2018-05-01 MED ORDER — CARVEDILOL 6.25 MG PO TABS
6.2500 mg | ORAL_TABLET | Freq: Two times a day (BID) | ORAL | Status: DC
  Administered 2018-05-01 – 2018-05-02 (×2): 6.25 mg via ORAL
  Filled 2018-05-01 (×2): qty 1

## 2018-05-01 MED ORDER — ONDANSETRON HCL 4 MG/2ML IJ SOLN
INTRAMUSCULAR | Status: AC
  Filled 2018-05-01: qty 2

## 2018-05-01 MED ORDER — FENTANYL CITRATE (PF) 100 MCG/2ML IJ SOLN
25.0000 ug | INTRAMUSCULAR | Status: DC | PRN
  Administered 2018-05-01: 25 ug via INTRAVENOUS

## 2018-05-01 MED ORDER — FAMOTIDINE IN NACL 20-0.9 MG/50ML-% IV SOLN
20.0000 mg | Freq: Two times a day (BID) | INTRAVENOUS | Status: DC
  Administered 2018-05-01 – 2018-05-02 (×2): 20 mg via INTRAVENOUS
  Filled 2018-05-01 (×2): qty 50

## 2018-05-01 MED ORDER — LIDOCAINE 2% (20 MG/ML) 5 ML SYRINGE
INTRAMUSCULAR | Status: DC | PRN
  Administered 2018-05-01: 1.5 mg/kg/h via INTRAVENOUS

## 2018-05-01 MED ORDER — ONDANSETRON HCL 4 MG/2ML IJ SOLN: 4.0000 mg | INTRAMUSCULAR | Status: DC | PRN

## 2018-05-01 MED ORDER — GABAPENTIN 300 MG PO CAPS
300.0000 mg | ORAL_CAPSULE | ORAL | Status: AC
  Administered 2018-05-01: 300 mg via ORAL
  Filled 2018-05-01: qty 1

## 2018-05-01 MED ORDER — MIDAZOLAM HCL 2 MG/2ML IJ SOLN
INTRAMUSCULAR | Status: AC
  Filled 2018-05-01: qty 2

## 2018-05-01 MED ORDER — MIDAZOLAM HCL 5 MG/5ML IJ SOLN
INTRAMUSCULAR | Status: DC | PRN
  Administered 2018-05-01: 2 mg via INTRAVENOUS

## 2018-05-01 MED ORDER — SCOPOLAMINE 1 MG/3DAYS TD PT72
1.0000 | MEDICATED_PATCH | TRANSDERMAL | Status: DC
  Administered 2018-05-01: 1.5 mg via TRANSDERMAL
  Filled 2018-05-01: qty 1

## 2018-05-01 MED ORDER — OXYCODONE HCL 5 MG PO TABS: 5.0000 mg | ORAL_TABLET | Freq: Once | ORAL | Status: DC | PRN

## 2018-05-01 MED ORDER — ALBUTEROL SULFATE HFA 108 (90 BASE) MCG/ACT IN AERS: 2.0000 | INHALATION_SPRAY | Freq: Four times a day (QID) | RESPIRATORY_TRACT | Status: DC | PRN

## 2018-05-01 MED ORDER — PHENYLEPHRINE 40 MCG/ML (10ML) SYRINGE FOR IV PUSH (FOR BLOOD PRESSURE SUPPORT)
PREFILLED_SYRINGE | INTRAVENOUS | Status: AC
  Filled 2018-05-01: qty 20

## 2018-05-01 MED ORDER — GLYCOPYRROLATE PF 0.2 MG/ML IJ SOSY
PREFILLED_SYRINGE | INTRAMUSCULAR | Status: DC | PRN
  Administered 2018-05-01: .2 mg via INTRAVENOUS

## 2018-05-01 MED ORDER — ACETAMINOPHEN 160 MG/5ML PO SOLN
650.0000 mg | Freq: Four times a day (QID) | ORAL | Status: DC
  Administered 2018-05-01 – 2018-05-02 (×4): 650 mg via ORAL
  Filled 2018-05-01 (×4): qty 20.3

## 2018-05-01 MED ORDER — KETAMINE HCL 10 MG/ML IJ SOLN
INTRAMUSCULAR | Status: AC
  Filled 2018-05-01: qty 1

## 2018-05-01 MED ORDER — INSULIN ASPART 100 UNIT/ML ~~LOC~~ SOLN
0.0000 [IU] | SUBCUTANEOUS | Status: DC
  Administered 2018-05-01: 11 [IU] via SUBCUTANEOUS
  Administered 2018-05-01: 4 [IU] via SUBCUTANEOUS
  Administered 2018-05-01 – 2018-05-02 (×2): 7 [IU] via SUBCUTANEOUS
  Administered 2018-05-02: 4 [IU] via SUBCUTANEOUS
  Administered 2018-05-02: 11 [IU] via SUBCUTANEOUS
  Administered 2018-05-02: 4 [IU] via SUBCUTANEOUS

## 2018-05-01 MED ORDER — BUPIVACAINE HCL (PF) 0.25 % IJ SOLN
INTRAMUSCULAR | Status: AC
  Filled 2018-05-01: qty 30

## 2018-05-01 MED ORDER — HYDRALAZINE HCL 20 MG/ML IJ SOLN: 10.0000 mg | INTRAMUSCULAR | Status: DC | PRN

## 2018-05-01 MED ORDER — LIDOCAINE 2% (20 MG/ML) 5 ML SYRINGE
INTRAMUSCULAR | Status: AC
  Filled 2018-05-01: qty 5

## 2018-05-01 MED ORDER — CHLORHEXIDINE GLUCONATE 4 % EX LIQD: 60.0000 mL | Freq: Once | CUTANEOUS | Status: DC

## 2018-05-01 MED ORDER — EPHEDRINE SULFATE-NACL 50-0.9 MG/10ML-% IV SOSY
PREFILLED_SYRINGE | INTRAVENOUS | Status: DC | PRN
  Administered 2018-05-01 (×3): 5 mg via INTRAVENOUS

## 2018-05-01 MED ORDER — SUGAMMADEX SODIUM 500 MG/5ML IV SOLN
INTRAVENOUS | Status: AC
  Filled 2018-05-01: qty 5

## 2018-05-01 MED ORDER — MORPHINE SULFATE (PF) 2 MG/ML IV SOLN
1.0000 mg | INTRAVENOUS | Status: DC | PRN
  Administered 2018-05-01: 2 mg via INTRAVENOUS
  Filled 2018-05-01: qty 1

## 2018-05-01 MED ORDER — OXYCODONE HCL 5 MG/5ML PO SOLN: 5.0000 mg | Freq: Once | ORAL | Status: DC | PRN

## 2018-05-01 MED ORDER — LIDOCAINE 2% (20 MG/ML) 5 ML SYRINGE: INTRAMUSCULAR | Status: DC | PRN

## 2018-05-01 MED ORDER — SODIUM CHLORIDE 0.9 % IV SOLN
2.0000 g | INTRAVENOUS | Status: AC
  Administered 2018-05-01: 2 g via INTRAVENOUS
  Filled 2018-05-01: qty 2

## 2018-05-01 MED ORDER — ROCURONIUM BROMIDE 100 MG/10ML IV SOLN
INTRAVENOUS | Status: AC
  Filled 2018-05-01: qty 1

## 2018-05-01 MED ORDER — LIDOCAINE HCL 2 % IJ SOLN
INTRAMUSCULAR | Status: AC
  Filled 2018-05-01: qty 20

## 2018-05-01 MED ORDER — PHENYLEPHRINE 40 MCG/ML (10ML) SYRINGE FOR IV PUSH (FOR BLOOD PRESSURE SUPPORT)
PREFILLED_SYRINGE | INTRAVENOUS | Status: AC
  Filled 2018-05-01: qty 10

## 2018-05-01 MED ORDER — FENTANYL CITRATE (PF) 250 MCG/5ML IJ SOLN
INTRAMUSCULAR | Status: AC
  Filled 2018-05-01: qty 5

## 2018-05-01 MED ORDER — EPHEDRINE 5 MG/ML INJ
INTRAVENOUS | Status: AC
  Filled 2018-05-01: qty 10

## 2018-05-01 MED ORDER — SUCCINYLCHOLINE CHLORIDE 200 MG/10ML IV SOSY
PREFILLED_SYRINGE | INTRAVENOUS | Status: DC | PRN
  Administered 2018-05-01: 200 mg via INTRAVENOUS

## 2018-05-01 MED ORDER — FENTANYL CITRATE (PF) 250 MCG/5ML IJ SOLN
INTRAMUSCULAR | Status: DC | PRN
  Administered 2018-05-01: 50 ug via INTRAVENOUS

## 2018-05-01 MED ORDER — ONDANSETRON HCL 4 MG/2ML IJ SOLN
INTRAMUSCULAR | Status: DC | PRN
  Administered 2018-05-01: 4 mg via INTRAVENOUS

## 2018-05-01 MED ORDER — BUPIVACAINE LIPOSOME 1.3 % IJ SUSP
INTRAMUSCULAR | Status: DC | PRN
  Administered 2018-05-01: 20 mL

## 2018-05-01 MED ORDER — LIDOCAINE 2% (20 MG/ML) 5 ML SYRINGE
INTRAMUSCULAR | Status: DC | PRN
  Administered 2018-05-01: 100 mg via INTRAVENOUS

## 2018-05-01 MED ORDER — PROPOFOL 10 MG/ML IV BOLUS
INTRAVENOUS | Status: DC | PRN
  Administered 2018-05-01: 200 mg via INTRAVENOUS

## 2018-05-01 MED ORDER — LACTATED RINGERS IR SOLN
Status: DC | PRN
  Administered 2018-05-01: 1000 mL

## 2018-05-01 MED ORDER — ENSURE MAX PROTEIN PO LIQD
2.0000 [oz_av] | ORAL | Status: DC
  Administered 2018-05-02 (×4): 2 [oz_av] via ORAL

## 2018-05-01 MED ORDER — APREPITANT 40 MG PO CAPS
40.0000 mg | ORAL_CAPSULE | ORAL | Status: AC
  Administered 2018-05-01: 40 mg via ORAL
  Filled 2018-05-01: qty 1

## 2018-05-01 MED ORDER — LEVOTHYROXINE SODIUM 75 MCG PO TABS
75.0000 ug | ORAL_TABLET | Freq: Every day | ORAL | Status: DC
  Administered 2018-05-02: 75 ug via ORAL
  Filled 2018-05-01: qty 1

## 2018-05-01 MED ORDER — ALBUTEROL SULFATE HFA 108 (90 BASE) MCG/ACT IN AERS
INHALATION_SPRAY | RESPIRATORY_TRACT | Status: AC
  Filled 2018-05-01: qty 6.7

## 2018-05-01 SURGICAL SUPPLY — 74 items
APL SKNCLS STERI-STRIP NONHPOA (GAUZE/BANDAGES/DRESSINGS)
APPLIER CLIP 5 13 M/L LIGAMAX5 (MISCELLANEOUS)
APPLIER CLIP ROT 10 11.4 M/L (STAPLE)
APPLIER CLIP ROT 13.4 12 LRG (CLIP) ×2
APR CLP LRG 13.4X12 ROT 20 MLT (CLIP) ×1
APR CLP MED LRG 11.4X10 (STAPLE)
APR CLP MED LRG 5 ANG JAW (MISCELLANEOUS)
BANDAGE ADH SHEER 1  50/CT (GAUZE/BANDAGES/DRESSINGS) IMPLANT
BENZOIN TINCTURE PRP APPL 2/3 (GAUZE/BANDAGES/DRESSINGS) IMPLANT
BLADE SURG SZ11 CARB STEEL (BLADE) ×2 IMPLANT
CABLE HIGH FREQUENCY MONO STRZ (ELECTRODE) ×1 IMPLANT
CHLORAPREP W/TINT 26ML (MISCELLANEOUS) ×2 IMPLANT
CLIP APPLIE 5 13 M/L LIGAMAX5 (MISCELLANEOUS) IMPLANT
CLIP APPLIE ROT 10 11.4 M/L (STAPLE) IMPLANT
CLIP APPLIE ROT 13.4 12 LRG (CLIP) IMPLANT
COVER SURGICAL LIGHT HANDLE (MISCELLANEOUS) ×2 IMPLANT
COVER WAND RF STERILE (DRAPES) ×2 IMPLANT
DEVICE SUTURE ENDOST 10MM (ENDOMECHANICALS) ×2 IMPLANT
DRAIN CHANNEL 19F RND (DRAIN) IMPLANT
DRAIN PENROSE 18X1/4 LTX STRL (WOUND CARE) ×2 IMPLANT
ELECT L-HOOK LAP 45CM DISP (ELECTROSURGICAL) ×2
ELECT PENCIL ROCKER SW 15FT (MISCELLANEOUS) ×2 IMPLANT
ELECTRODE L-HOOK LAP 45CM DISP (ELECTROSURGICAL) ×1 IMPLANT
EVACUATOR SILICONE 100CC (DRAIN) IMPLANT
GAUZE 4X4 16PLY RFD (DISPOSABLE) ×2 IMPLANT
GAUZE SPONGE 4X4 12PLY STRL (GAUZE/BANDAGES/DRESSINGS) IMPLANT
GLOVE BIOGEL PI IND STRL 7.0 (GLOVE) ×1 IMPLANT
GLOVE BIOGEL PI INDICATOR 7.0 (GLOVE) ×1
GLOVE SURG SS PI 7.0 STRL IVOR (GLOVE) ×2 IMPLANT
GOWN STRL REUS W/TWL LRG LVL3 (GOWN DISPOSABLE) ×2 IMPLANT
GOWN STRL REUS W/TWL XL LVL3 (GOWN DISPOSABLE) ×5 IMPLANT
GRASPER SUT TROCAR 14GX15 (MISCELLANEOUS) IMPLANT
HANDLE STAPLE EGIA 4 XL (STAPLE) ×2 IMPLANT
HOLDER FOLEY CATH W/STRAP (MISCELLANEOUS) ×1 IMPLANT
HOVERMATT SINGLE USE (MISCELLANEOUS) ×2 IMPLANT
IRRIG SUCT STRYKERFLOW 2 WTIP (MISCELLANEOUS) ×2
IRRIGATION SUCT STRKRFLW 2 WTP (MISCELLANEOUS) ×1 IMPLANT
KIT BASIN OR (CUSTOM PROCEDURE TRAY) ×2 IMPLANT
KIT GASTRIC LAVAGE 34FR ADT (SET/KITS/TRAYS/PACK) IMPLANT
MARKER SKIN DUAL TIP RULER LAB (MISCELLANEOUS) ×2 IMPLANT
NDL SPNL 22GX3.5 QUINCKE BK (NEEDLE) ×1 IMPLANT
NEEDLE SPNL 22GX3.5 QUINCKE BK (NEEDLE) ×2 IMPLANT
PACK CARDIOVASCULAR III (CUSTOM PROCEDURE TRAY) ×2 IMPLANT
RELOAD EGIA 45 MED/THCK PURPLE (STAPLE) ×2 IMPLANT
RELOAD EGIA 45 TAN VASC (STAPLE) IMPLANT
RELOAD EGIA 60 MED/THCK PURPLE (STAPLE) ×8 IMPLANT
RELOAD EGIA 60 TAN VASC (STAPLE) ×6 IMPLANT
RELOAD ENDO STITCH 2.0 (ENDOMECHANICALS) ×26
RELOAD STAPLE 60 MED/THCK ART (STAPLE) ×4 IMPLANT
RELOAD SUT SNGL STCH ABSRB 2-0 (ENDOMECHANICALS) ×5 IMPLANT
RELOAD SUT SNGL STCH BLK 2-0 (ENDOMECHANICALS) ×5 IMPLANT
SCISSORS LAP 5X45 EPIX DISP (ENDOMECHANICALS) ×2 IMPLANT
SET TUBE SMOKE EVAC HIGH FLOW (TUBING) ×2 IMPLANT
SHEARS HARMONIC ACE PLUS 45CM (MISCELLANEOUS) ×2 IMPLANT
SLEEVE XCEL OPT CAN 5 100 (ENDOMECHANICALS) ×6 IMPLANT
SOLUTION ANTI FOG 6CC (MISCELLANEOUS) ×2 IMPLANT
STAPLER VISISTAT 35W (STAPLE) IMPLANT
STRIP CLOSURE SKIN 1/2X4 (GAUZE/BANDAGES/DRESSINGS) IMPLANT
SUT ETHILON 2 0 PS N (SUTURE) IMPLANT
SUT MNCRL AB 4-0 PS2 18 (SUTURE) ×2 IMPLANT
SUT RELOAD ENDO STITCH 2 48X1 (ENDOMECHANICALS) ×7
SUT RELOAD ENDO STITCH 2.0 (ENDOMECHANICALS) ×6
SUT SILK 0 SH 30 (SUTURE) IMPLANT
SUT VICRYL 0 TIES 12 18 (SUTURE) IMPLANT
SUTURE RELOAD END STTCH 2 48X1 (ENDOMECHANICALS) ×7 IMPLANT
SUTURE RELOAD ENDO STITCH 2.0 (ENDOMECHANICALS) ×6 IMPLANT
SYR 20CC LL (SYRINGE) ×2 IMPLANT
SYR 50ML LL SCALE MARK (SYRINGE) ×2 IMPLANT
TOWEL OR 17X26 10 PK STRL BLUE (TOWEL DISPOSABLE) ×2 IMPLANT
TOWEL OR NON WOVEN STRL DISP B (DISPOSABLE) ×2 IMPLANT
TRAY FOLEY CATH 14FR (SET/KITS/TRAYS/PACK) ×1 IMPLANT
TROCAR BLADELESS OPT 5 100 (ENDOMECHANICALS) ×2 IMPLANT
TROCAR XCEL 12X100 BLDLESS (ENDOMECHANICALS) ×2 IMPLANT
TUBING CONNECTING 10 (TUBING) ×2 IMPLANT

## 2018-05-01 NOTE — Progress Notes (Signed)
Inpatient Diabetes Program Recommendations  AACE/ADA: New Consensus Statement on Inpatient Glycemic Control (2015)  Target Ranges:  Prepandial:   less than 140 mg/dL      Peak postprandial:   less than 180 mg/dL (1-2 hours)      Critically ill patients:  140 - 180 mg/dL   Lab Results  Component Value Date   GLUCAP 210 (H) 05/01/2018   HGBA1C 6.7 (H) 04/25/2018    Review of Glycemic Control  Diabetes history: DM2 Outpatient Diabetes medications: 70/30 62 units bid, Victoza 1.8 mg QD Current orders for Inpatient glycemic control: Novolog 0-20 units Q4H  HgbA1C - 6.7% - good glycemic control CBGs 144, 156, 179, 210  Inpatient Diabetes Program Recommendations:  (for home)  Novolog 0-20 units Q4H  If FBS in am is > 180 mg/dL, may need small amount Lantus. Would not use 70/30 insulin.  Will speak with pt in am.   Thank you. Lorenda Peck, RD, LDN, CDE Inpatient Diabetes Coordinator [phone]

## 2018-05-01 NOTE — Op Note (Signed)
[name] [id number] 21-Oct-1965 05/01/2018  Preoperative diagnosis: severe obesity  Postoperative diagnosis: Same   Procedure: Upper endoscopy   Surgeon: Gayland Curry M.D., FACS   Anesthesia: Gen.   Indications for procedure: 53 y.o. yo female undergoing a laparoscopic roux en y gastric bypass and an upper endoscopy was requested to evaluate the anastomosis.  Description of procedure: After we have completed the new gastrojejunostomy, I scrubbed out and obtained the Olympus endoscope. I gently placed endoscope in the patient's oropharynx and gently glided it down the esophagus without any difficulty under direct visualization. Once I was in the gastric pouch, I insufflated the pouch was air. The pouch was approximately 5 cm in size. I was able to cannulate and advanced the scope through the gastrojejunostomy. Dr.Kinsinger had placed saline in the upper abdomen. Upon further insufflation of the gastric pouch there was no evidence of bubbles. Upon further inspection of the gastric pouch, the mucosa appeared normal. There is no evidence of any mucosal abnormality. The gastric pouch and Roux limb were decompressed. The width of the gastrojejunal anastomosis was at least 2 cm. The scope was withdrawn. The patient tolerated this portion of the procedure well. Please see Dr Amie Portland operative note for details regarding the laparoscopic roux-en-y gastric bypass.  Lori Ruff. Redmond Pulling, MD, FACS General, Bariatric, & Minimally Invasive Surgery [facility], [place]

## 2018-05-01 NOTE — Progress Notes (Addendum)
PHARMACY CONSULT FOR:  Risk Assessment for Post-Discharge VTE Following Bariatric Surgery  Post-Discharge VTE Risk Assessment: This patient's probability of 30-day post-discharge VTE is increased due to the factors marked:   Female    Age >/=60 years   x BMI >/=50 kg/m2    CHF    Dyspnea at Rest    Paraplegia   x Non-gastric-band surgery    Operation Time >/=3 hr    Return to OR     Length of Stay >/= 3 d   Predicted probability of 30-day post-discharge VTE: 0.27%  Other patient-specific factors to consider:   Recommendation for Discharge: No pharmacologic prophylaxis post-discharge    [name] is a 53 y.o. female who underwent laparoscopic Roux-en-Y gastric bypass on 05/01/18   Case start: 0811 Case end: 0955   No Known Allergies  Patient Measurements: Height: 5' 0.5" (153.7 cm) Weight: (!) 326 lb 8 oz (148.1 kg) IBW/kg (Calculated) : 46.65 Body mass index is 62.72 kg/m.  Recent Labs    05/01/18 1047  HGB 10.7*  HCT 35.6*   Estimated Creatinine Clearance: 51.2 mL/min (A) (by C-G formula based on SCr of 1.79 mg/dL (H)).    Past Medical History:  Diagnosis Date  . ARF (acute renal failure) ([place]) 05/03/2014  . Asthma   . Asthma exacerbation 05/03/2014  . Asthma, severe persistent 05/03/2014  . CHF (congestive heart failure) ([place])   . Diabetes mellitus   . Diabetes mellitus type 2 in obese ([place]) 05/03/2014  . History of echocardiogram 05/979   LVH, diastolic dysfunction  . Hyperlipidemia   . Hypertension   . Hyponatremia 12/02/2014  . Obesity   . SOB (shortness of breath) 12/22/2014     Medications Prior to Admission  Medication Sig Dispense Refill Last Dose  . albuterol (PROVENTIL HFA;VENTOLIN HFA) 108 (90 BASE) MCG/ACT inhaler Inhale 2 puffs into the lungs every 6 (six) hours as needed for wheezing.   Past Month at Unknown time  . aspirin EC 81 MG tablet Take 162 mg by mouth daily.    Past Month at Unknown time  . atorvastatin (LIPITOR) 20 MG  tablet Take 20 mg by mouth daily.    05/01/2018 at 0330  . Calcium Carbonate-Vitamin D (CALCIUM 600+D PO) Take 1 tablet by mouth daily.   04/30/2018 at Unknown time  . carboxymethylcellulose (REFRESH PLUS) 0.5 % SOLN Place 1 drop into both eyes 3 (three) times daily as needed (dry eyes).   Past Week at Unknown time  . carvedilol (COREG) 6.25 MG tablet Take 6.25 mg by mouth 2 (two) times daily.    05/01/2018 at 0330  . Cholecalciferol (VITAMIN D) 2000 units tablet Take 2,000 Units by mouth daily.    04/30/2018 at Unknown time  . Fluticasone-Salmeterol (ADVAIR) 100-50 MCG/DOSE AEPB Inhale 1 puff into the lungs 2 (two) times daily.    Past Month at Unknown time  . gabapentin (NEURONTIN) 300 MG capsule Take 300 mg by mouth 4 (four) times daily.    04/30/2018 at Unknown time  . Insulin Isophane & Regular Human (HUMULIN 70/30 KWIKPEN) (70-30) 100 UNIT/ML PEN Inject 62 Units into the skin 2 (two) times daily. (Patient taking differently: Inject 62 Units into the skin 2 (two) times daily. ) 15 mL 2 04/30/2018 at 1800  . levothyroxine (SYNTHROID, LEVOTHROID) 75 MCG tablet Take 75 mcg by mouth daily before breakfast.   04/30/2018 at Unknown time  . liraglutide (VICTOZA) 18 MG/3ML SOPN Inject 1.8 mg into the skin daily.  04/30/2018 at Unknown time  . lisinopril (PRINIVIL,ZESTRIL) 2.5 MG tablet Take 2.5 mg by mouth daily.    04/30/2018 at Unknown time  . montelukast (SINGULAIR) 10 MG tablet Take 10 mg by mouth daily.    05/01/2018 at 0330  . OXYGEN Inhale 8 L into the lungs daily as needed. Only at bedtime as needed   04/30/2018 at Unknown time  . torsemide (DEMADEX) 20 MG tablet Take 1 tablet (20 mg total) by mouth daily. (Patient taking differently: Take 80 mg by mouth daily. ) 30 tablet 1 04/30/2018 at Unknown time  . acetaminophen (TYLENOL) 500 MG tablet Take 1,000 mg by mouth every 6 (six) hours as needed for moderate pain.   More than a month at Unknown time  . albuterol (PROVENTIL) (2.5 MG/3ML) 0.083% nebulizer solution Take 3  mLs (2.5 mg total) by nebulization every 6 (six) hours as needed for wheezing or shortness of breath. 75 mL 12 More than a month at Unknown time       Kara Mead 05/01/2018,12:46 PM   Patient has history of heart failure on loop diuretic. So calculated risk is 1.72%. We will treat with 2 weeks of prophylactic lovenox dose

## 2018-05-01 NOTE — Anesthesia Procedure Notes (Signed)
Procedure Name: Intubation Date/Time: 05/01/2018 7:48 AM Performed by: Mitzie Na, CRNA Pre-anesthesia Checklist: Patient identified, Emergency Drugs available, Suction available, Patient being monitored and Timeout performed Patient Re-evaluated:Patient Re-evaluated prior to induction Oxygen Delivery Method: Circle system utilized Preoxygenation: Pre-oxygenation with 100% oxygen Induction Type: Rapid sequence and IV induction Laryngoscope Size: Glidescope and 4 Grade View: Grade III Tube type: Oral Tube size: 7.5 mm Number of attempts: 2 Airway Equipment and Method: Video-laryngoscopy and Stylet Placement Confirmation: ETT inserted through vocal cords under direct vision,  positive ETCO2 and breath sounds checked- equal and bilateral Secured at: 24 cm Tube secured with: Tape Dental Injury: Teeth and Oropharynx as per pre-operative assessment

## 2018-05-01 NOTE — Transfer of Care (Signed)
Immediate Anesthesia Transfer of Care Note  Patient: [name]  Procedure(s) Performed: LAPAROSCOPIC ROUX-EN-Y GASTRIC BYPASS WITH UPPER ENDOSCOPY WITH ERAS PATHWAY (N/A )  Patient Location: PACU  Anesthesia Type:General  Level of Consciousness: awake, drowsy and patient cooperative  Airway & Oxygen Therapy: Patient Spontanous Breathing and Patient connected to face mask oxygen  Post-op Assessment: Report given to RN, Post -op Vital signs reviewed and stable and Patient moving all extremities  Post vital signs: Reviewed and stable  Last Vitals:  Vitals Value Taken Time  BP 153/86 05/01/2018 10:09 AM  Temp    Pulse 69 05/01/2018 10:11 AM  Resp 18 05/01/2018 10:11 AM  SpO2 100 % 05/01/2018 10:11 AM  Vitals shown include unvalidated device data.  Last Pain:  Vitals:   05/01/18 0601  TempSrc:   PainSc: 0-No pain         Complications: No apparent anesthesia complications

## 2018-05-01 NOTE — Progress Notes (Signed)
Patient sitting in chair, alert and oriented.  Alerted by staff patient lower incisions oozing some red drainage.  Incision currently reinforced with dressings.  Removed reinforcement, band aids, and steri strips.  Incisions cleansed.  Reapplied steri strips, placed 2x2 dressing with Tegaderm.  No active bleeding noted with changes. Abdominal binder obtained.  No questions at this time.

## 2018-05-01 NOTE — H&P (Signed)
[name] is an 53 y.o. female.   Chief Complaint: obesity HPI: 53 yo female has completed all requirements and is ready to proceed with bariatric surgery  Past Medical History:  Diagnosis Date  . ARF (acute renal failure) ([place]) 05/03/2014  . Asthma   . Asthma exacerbation 05/03/2014  . Asthma, severe persistent 05/03/2014  . CHF (congestive heart failure) ([place])   . Diabetes mellitus   . Diabetes mellitus type 2 in obese ([place]) 05/03/2014  . History of echocardiogram 09/4164   LVH, diastolic dysfunction  . Hyperlipidemia   . Hypertension   . Hyponatremia 12/02/2014  . Obesity   . SOB (shortness of breath) 12/22/2014    Past Surgical History:  Procedure Laterality Date  . CARDIAC CATHETERIZATION  03/2013   normal coronary arteries, EF 55%  . CARDIAC CATHETERIZATION  03/2013   normal coronary arteries  . CATARACT EXTRACTION W/PHACO Right 12/10/2012   Procedure: CATARACT EXTRACTION PHACO AND INTRAOCULAR LENS PLACEMENT ([place]);  Surgeon: Tonny Branch, MD;  Location: [facility];  Service: Ophthalmology;  Laterality: Right;  CDE:22..42  . CATARACT EXTRACTION W/PHACO Left 05/11/2015   Procedure: CATARACT EXTRACTION PHACO AND INTRAOCULAR LENS PLACEMENT LEFT EYE CDE=6.54;  Surgeon: Tonny Branch, MD;  Location: [facility];  Service: Ophthalmology;  Laterality: Left;  . COLONOSCOPY N/A 09/22/2017   Procedure: COLONOSCOPY;  Surgeon: Danie Binder, MD;  Location: [facility] ENDO SUITE;  Service: Endoscopy;  Laterality: N/A;  12:15  . EYE SURGERY Left   . LEFT HEART CATHETERIZATION WITH CORONARY ANGIOGRAM N/A 04/09/2013   Procedure: LEFT HEART CATHETERIZATION WITH CORONARY ANGIOGRAM;  Surgeon: Laverda Page, MD;  Location[facility] CATH LAB;  Service: Cardiovascular;  Laterality: N/A;  . TRACHEOSTOMY     at age 76 from asthma attack    Family History  Problem Relation Age of Onset  . Diabetes Mother   . Asthma Other   . Hypertension Other    Social History:  reports that she has never smoked. She has never used  smokeless tobacco. She reports current alcohol use. She reports that she does not use drugs.  Allergies: No Known Allergies  Medications Prior to Admission  Medication Sig Dispense Refill  . albuterol (PROVENTIL HFA;VENTOLIN HFA) 108 (90 BASE) MCG/ACT inhaler Inhale 2 puffs into the lungs every 6 (six) hours as needed for wheezing.    Marland Kitchen aspirin EC 81 MG tablet Take 162 mg by mouth daily.     Marland Kitchen atorvastatin (LIPITOR) 20 MG tablet Take 20 mg by mouth daily.     . Calcium Carbonate-Vitamin D (CALCIUM 600+D PO) Take 1 tablet by mouth daily.    . carboxymethylcellulose (REFRESH PLUS) 0.5 % SOLN Place 1 drop into both eyes 3 (three) times daily as needed (dry eyes).    . carvedilol (COREG) 6.25 MG tablet Take 6.25 mg by mouth 2 (two) times daily.     . Cholecalciferol (VITAMIN D) 2000 units tablet Take 2,000 Units by mouth daily.     . Fluticasone-Salmeterol (ADVAIR) 100-50 MCG/DOSE AEPB Inhale 1 puff into the lungs 2 (two) times daily.     Marland Kitchen gabapentin (NEURONTIN) 300 MG capsule Take 300 mg by mouth 4 (four) times daily.     . Insulin Isophane & Regular Human (HUMULIN 70/30 KWIKPEN) (70-30) 100 UNIT/ML PEN Inject 62 Units into the skin 2 (two) times daily. (Patient taking differently: Inject 62 Units into the skin 2 (two) times daily. ) 15 mL 2  . levothyroxine (SYNTHROID, LEVOTHROID) 75 MCG tablet Take 75  mcg by mouth daily before breakfast.    . liraglutide (VICTOZA) 18 MG/3ML SOPN Inject 1.8 mg into the skin daily.     Marland Kitchen lisinopril (PRINIVIL,ZESTRIL) 2.5 MG tablet Take 2.5 mg by mouth daily.     . montelukast (SINGULAIR) 10 MG tablet Take 10 mg by mouth daily.     . OXYGEN Inhale 8 L into the lungs daily as needed. Only at bedtime as needed    . torsemide (DEMADEX) 20 MG tablet Take 1 tablet (20 mg total) by mouth daily. (Patient taking differently: Take 80 mg by mouth daily. ) 30 tablet 1  . acetaminophen (TYLENOL) 500 MG tablet Take 1,000 mg by mouth every 6 (six) hours as needed for moderate  pain.    Marland Kitchen albuterol (PROVENTIL) (2.5 MG/3ML) 0.083% nebulizer solution Take 3 mLs (2.5 mg total) by nebulization every 6 (six) hours as needed for wheezing or shortness of breath. 75 mL 12  . pantoprazole (PROTONIX) 20 MG tablet Take 1 tablet (20 mg total) by mouth daily. (Patient not taking: Reported on 09/18/2017) 30 tablet 0    Results for orders placed or performed during the hospital encounter of 05/01/18 (from the past 48 hour(s))  Glucose, capillary     Status: Abnormal   Collection Time: 05/01/18  5:41 AM  Result Value Ref Range   Glucose-Capillary 227 (H) 70 - 99 mg/dL   Comment 1 Notify RN    Comment 2 Document in Chart    No results found.  Review of Systems  Constitutional: Negative for chills and fever.  HENT: Negative for hearing loss.   Eyes: Negative for blurred vision and double vision.  Respiratory: Negative for cough and hemoptysis.   Cardiovascular: Negative for chest pain and palpitations.  Gastrointestinal: Negative for abdominal pain, nausea and vomiting.  Genitourinary: Negative for dysuria and urgency.  Musculoskeletal: Negative for myalgias and neck pain.  Skin: Negative for itching and rash.  Neurological: Negative for dizziness, tingling and headaches.  Endo/Heme/Allergies: Does not bruise/bleed easily.  Psychiatric/Behavioral: Negative for depression and suicidal ideas.    Blood pressure (!) 144/85, pulse 94, temperature 98 F (36.7 C), temperature source Oral, resp. rate 18, height 5' 0.5" (1.537 m), weight (!) 148.1 kg, last menstrual period 09/11/2013, SpO2 98 %. Physical Exam  Vitals reviewed. Constitutional: She is oriented to person, place, and time. She appears well-developed and well-nourished.  HENT:  Head: Normocephalic and atraumatic.  Eyes: Pupils are equal, round, and reactive to light. Conjunctivae and EOM are normal.  Neck: Normal range of motion. Neck supple.  Cardiovascular: Normal rate and regular rhythm.  Respiratory: Effort  normal and breath sounds normal.  GI: Soft. Bowel sounds are normal. She exhibits no distension. There is no abdominal tenderness.  Musculoskeletal: Normal range of motion.  Neurological: She is alert and oriented to person, place, and time.  Skin: Skin is warm and dry.  Psychiatric: She has a normal mood and affect. Her behavior is normal.     Assessment/Plan 53 yo female with class III obesity, heart failure, DM, asthma, CKD presents for bariatric surgery -lap gastric bypass -bariatric inpatient protocol  Arta Bruce Kinsinger, MD 05/01/2018, 7:20 AM

## 2018-05-01 NOTE — Anesthesia Postprocedure Evaluation (Signed)
Anesthesia Post Note  Patient: [name]  Procedure(s) Performed: LAPAROSCOPIC ROUX-EN-Y GASTRIC BYPASS WITH UPPER ENDOSCOPY WITH ERAS PATHWAY (N/A )     Patient location during evaluation: PACU Anesthesia Type: General Level of consciousness: awake and alert Pain management: pain level controlled Vital Signs Assessment: post-procedure vital signs reviewed and stable Respiratory status: spontaneous breathing, nonlabored ventilation, respiratory function stable and patient connected to nasal cannula oxygen Cardiovascular status: blood pressure returned to baseline and stable Postop Assessment: no apparent nausea or vomiting Anesthetic complications: no    Last Vitals:  Vitals:   05/01/18 1254 05/01/18 1354  BP: 135/65 124/66  Pulse: 70 71  Resp: 16 18  Temp:  (!) 36.3 C  SpO2: 99% 99%    Last Pain:  Vitals:   05/01/18 1436  TempSrc:   PainSc: 10-Worst pain ever                 Charliegh Vasudevan COKER

## 2018-05-01 NOTE — Op Note (Signed)
Preop Diagnosis: Obesity Class III  Postop Diagnosis: same  Procedure performed: laparoscopic Roux en Y gastric bypass  Assitant: Greer Pickerel  Indications:  The patient is a 53 y.o. year-old morbidly obese female who has been followed in the Bariatric Clinic as an outpatient. This patient was diagnosed with morbid obesity with a BMI of Body mass index is 62.72 kg/m. and significant co-morbidities including hypertension and non-insulin dependent diabetes.  The patient was counseled extensively in the Bariatric Outpatient Clinic and after a thorough explanation of the risks and benefits of surgery (including death from complications, bowel leak, infection such as peritonitis and/or sepsis, internal hernia, bleeding, need for blood transfusion, bowel obstruction, organ failure, pulmonary embolus, deep venous thrombosis, wound infection, incisional hernia, skin breakdown, and others entailed on the consent form) and after a compliant diet and exercise program, the patient was scheduled for an elective laparoscopic gastric bypass.  Description of Operation:  Following informed consent, the patient was taken to the operating room and placed on the operating table in the supine position.  She had previously received prophylactic antibiotics and subcutaneous heparin for DVT prophylaxis in the pre-op holding area.  After induction of general endotracheal anesthesia by the anesthesiologist, the patient underwent placement of sequential compression devices, Foley catheter and an oro-gastric tube.  A timeout was confirmed by the surgery and anesthesia teams.  The patient was adequately padded at all pressure points and placed on a footboard to prevent slippage from the OR table during extremes of position during surgery.  She underwent a routine sterile prep and drape of her entire abdomen.    Next, A transverse incision was made under the left subcostal area and a 80mm optical viewing trocar was introduced into  the peritoneal cavity. Pneumoperitoneum was applied with a high flow and low pressure. A laparoscope was inserted to confirm placement. A extraperitoneal block was then placed at the lateral abdominal wall using exparel diluted with marcaine . 5 additional trocars were placed: 1 7mm trocar to the left of the midline. 1 additional 8mm trocar in the left lateral area, 1 95mm trocar in the right mid abdomen, and 1 18mm trocar in the right subcostal area.  There was a greater than average amount of adhesive disease of the omentum to the left upper quadrant. This was dissected free with harmonic scalpel.  The greater omentum was flipped over the transverse colon and under the left lobe of the liver. The ligament of trietz was identified. 40cm of jejunum was measured starting from the ligament of Trietz. The mesentery was checked to ensure mobility. Next, a 17mm 2-82mm tristapler was used to divide the jejunum at this location. The harmonic scalpel was used to divide the mesentery down to the origin. A 1/2" penrose was sutured to the distal side. 100cm of jejunum was measured starting at the division. 2-0 silk was used to appose the biliary limb to the 100cm mark of jejunum in 2 places. Enterotomies were made in the biliary and common channels and a 45mm 2-3 tristapler was used to create the J-J anastomosis. A 2-0 silk was used to appose the enterotomy edges and a 653mm 2-3 tristapler was used to close the enterotomy. An anti-obstruction 2-0 silk suture was placed. Next, the mesenteric defect was closed with a 2-0 silk in running fashion.The J-J appeared patent and in neutral position.  Next, the omentum was divided using the Harmonic scalpel. The patient was placed in steep Reverse Trendelenberg position. A Nathanson retracted was placed through  a subxiphoid incision and used to retract the liver. The fat pad over the fundus was incised to free the fundus. Next, a position along the lesser curve 6cm from GE junction  was identified. The pars flaccida was entered and the fat over the lesser curve divided to enter the lesser sac. 4 45mm 3-32mm tristaple firings were peformed to create a 6cm pouch. There was bleeding on the remnant stomach staple line that was clipped in 3 places with good hemostasis afterward. The Roux limb was identified using the placed penrose and brought up to the stomach in antecolic fashion. The limb was inspected to ensure a neutral position. A 2-0 vicryl suture was then used to create a posterior layer connecting the stomach to the Roux limb jejunum in running fashion. Next cautery was used to create an enterotomy along the medial aspect of this suture line and Harmonic scalpel used to create gastotomy. A 82mm 3-81mm tristapler was then used to create a 25-26mm anastomosis. 2 2-0 vicryl sutures were used in running fashion to close the gastrotomy. Finally, a 2-0 vicryl suture was used to close an anterior layer of stomach and jejunum over the anastomosis in running fashion. The penrose was removed from the Roux limb. A 2-0 vicryl was used to appose the transverse mesocolon to the mesentery of the Roux limb.  The assistant then went and performed an upper endoscopy and leak test. No bubbles were seen and the pouch and limb distended appropriately. The limb and pouch were deflated, the endoscope was removed. Hemostasis was ensured. Pneumoperitoneum was evacuated, all ports were removed and all incisions closed with 4-0 monocryl suture in subcuticular fashion. Steristrips and bandaids were put in place for dressing. The patient awoke from anesthesia and was brought to pacu in stable condition. All counts were correct.  Specimens:  None  Estimated Blood Loss: <30 ml  Local Anesthesia: 50 ml Exparel: 0.5% Marcaine Mix  Post-Op Plan:       Pain Management: PO, prn      Antibiotics: Prophylactic      Anticoagulation: Prophylactic, Starting now      Post Op Studies/Consults: Not applicable       Intended Discharge: within 48h      Intended Outpatient Follow-Up: Two Week      Intended Outpatient Studies: Not Applicable      Other: Not Applicable   Arta Bruce Shawntay Prest

## 2018-05-01 NOTE — Progress Notes (Signed)
Pt refused cpap tonight stating she prefers the "oxygen in her nose."  Pt currently on 2lnc.  Pt was encouraged to call should she change her mind.  RN aware.

## 2018-05-01 NOTE — Progress Notes (Signed)
Patient has walked in her room, sat in the chair and achieved up to 750 on incentive spirometer. Patient's pain and nausea are controlled, 2 ounces of water given.

## 2018-05-02 ENCOUNTER — Telehealth: Payer: Self-pay | Admitting: Skilled Nursing Facility1

## 2018-05-02 ENCOUNTER — Encounter (HOSPITAL_COMMUNITY): Payer: Self-pay | Admitting: General Surgery

## 2018-05-02 LAB — CBC WITH DIFFERENTIAL/PLATELET
Abs Immature Granulocytes: 0.07 10*3/uL (ref 0.00–0.07)
Basophils Absolute: 0 10*3/uL (ref 0.0–0.1)
Basophils Relative: 0 %
Eosinophils Absolute: 0 10*3/uL (ref 0.0–0.5)
Eosinophils Relative: 0 %
HCT: 35.3 % — ABNORMAL LOW (ref 36.0–46.0)
Hemoglobin: 10.4 g/dL — ABNORMAL LOW (ref 12.0–15.0)
Immature Granulocytes: 1 %
Lymphocytes Relative: 8 %
Lymphs Abs: 1.2 10*3/uL (ref 0.7–4.0)
MCH: 26.4 pg (ref 26.0–34.0)
MCHC: 29.5 g/dL — ABNORMAL LOW (ref 30.0–36.0)
MCV: 89.6 fL (ref 80.0–100.0)
MONO ABS: 0.9 10*3/uL (ref 0.1–1.0)
Monocytes Relative: 6 %
Neutro Abs: 13.3 10*3/uL — ABNORMAL HIGH (ref 1.7–7.7)
Neutrophils Relative %: 85 %
Platelets: 216 10*3/uL (ref 150–400)
RBC: 3.94 MIL/uL (ref 3.87–5.11)
RDW: 14 % (ref 11.5–15.5)
WBC: 15.5 10*3/uL — ABNORMAL HIGH (ref 4.0–10.5)
nRBC: 0 % (ref 0.0–0.2)

## 2018-05-02 LAB — COMPREHENSIVE METABOLIC PANEL
ALT: 19 U/L (ref 0–44)
AST: 22 U/L (ref 15–41)
Albumin: 3.1 g/dL — ABNORMAL LOW (ref 3.5–5.0)
Alkaline Phosphatase: 76 U/L (ref 38–126)
Anion gap: 7 (ref 5–15)
BUN: 63 mg/dL — ABNORMAL HIGH (ref 6–20)
CO2: 24 mmol/L (ref 22–32)
Calcium: 8.6 mg/dL — ABNORMAL LOW (ref 8.9–10.3)
Chloride: 106 mmol/L (ref 98–111)
Creatinine, Ser: 1.58 mg/dL — ABNORMAL HIGH (ref 0.44–1.00)
GFR calc Af Amer: 43 mL/min — ABNORMAL LOW (ref >60)
GFR calc non Af Amer: 37 mL/min — ABNORMAL LOW (ref >60)
Glucose, Bld: 203 mg/dL — ABNORMAL HIGH (ref 70–99)
Potassium: 4 mmol/L (ref 3.5–5.1)
SODIUM: 137 mmol/L (ref 135–145)
Total Bilirubin: 0.4 mg/dL (ref 0.3–1.2)
Total Protein: 6.5 g/dL (ref 6.5–8.1)

## 2018-05-02 LAB — GLUCOSE, CAPILLARY
Glucose-Capillary: 173 mg/dL — ABNORMAL HIGH (ref 70–99)
Glucose-Capillary: 203 mg/dL — ABNORMAL HIGH (ref 70–99)
Glucose-Capillary: 166 mg/dL — ABNORMAL HIGH (ref 70–99)

## 2018-05-02 MED ORDER — INSULIN LISPRO (1 UNIT DIAL) 100 UNIT/ML (KWIKPEN): 0.0000 [IU] | PEN_INJECTOR | Freq: Three times a day (TID) | SUBCUTANEOUS | 11 refills | Status: DC

## 2018-05-02 MED ORDER — ENOXAPARIN SODIUM 40 MG/0.4ML ~~LOC~~ SOLN: 40.0000 mg | SUBCUTANEOUS | 0 refills | Status: DC

## 2018-05-02 MED ORDER — PANTOPRAZOLE SODIUM 40 MG PO TBEC: 40.0000 mg | DELAYED_RELEASE_TABLET | Freq: Every day | ORAL | 0 refills | Status: DC

## 2018-05-02 MED ORDER — OXYCODONE HCL 5 MG PO TABS: 5.0000 mg | ORAL_TABLET | Freq: Four times a day (QID) | ORAL | 0 refills | Status: DC | PRN

## 2018-05-02 MED ORDER — ENOXAPARIN (LOVENOX) PATIENT EDUCATION KIT
PACK | Freq: Once | Status: AC
  Administered 2018-05-02: 12:00:00

## 2018-05-02 MED ORDER — INSULIN GLARGINE 100 UNIT/ML SOLOSTAR PEN: 10.0000 [IU] | PEN_INJECTOR | Freq: Every day | SUBCUTANEOUS | 11 refills | Status: DC

## 2018-05-02 MED ORDER — GABAPENTIN 300 MG PO CAPS: 300.0000 mg | ORAL_CAPSULE | Freq: Two times a day (BID) | ORAL | 0 refills | Status: DC

## 2018-05-02 MED ORDER — ONDANSETRON 4 MG PO TBDP: 4.0000 mg | ORAL_TABLET | Freq: Four times a day (QID) | ORAL | 0 refills | Status: DC | PRN

## 2018-05-02 NOTE — Progress Notes (Signed)
Inpatient Diabetes Program Recommendations  AACE/ADA: New Consensus Statement on Inpatient Glycemic Control (2015)  Target Ranges:  Prepandial:   less than 140 mg/dL      Peak postprandial:   less than 180 mg/dL (1-2 hours)      Critically ill patients:  140 - 180 mg/dL   Lab Results  Component Value Date   GLUCAP 203 (H) 05/02/2018   HGBA1C 6.7 (H) 04/25/2018    Review of Glycemic Control  CBGs 173, 203, 203.  Received 11 units of Novolog thus far today.  Spoke with pt about importance of checking blood sugars and contact PCP if blood sugars consistently > 250 mg/dL or < 70 mg/dL. Pt has appt with PCP on 05/15/2018.  Inpatient Diabetes Program Recommendations:     To be d/ced on Lantus 10 units QD (Pen) Novolog 0-20 units tidwc (Pen) Pen needles  Discussed with MD and RN.  Thank you. Lorenda Peck, RD, LDN, CDE Inpatient Diabetes Coordinator [phone]

## 2018-05-02 NOTE — Discharge Instructions (Signed)
° ° ° °GASTRIC BYPASS/SLEEVE ° Home Care Instructions ° ° These instructions are to help you care for yourself when you go home. ° °Call: If you have any problems. °• Call [phone] and ask for the surgeon on call °• If you need immediate help, come to the ER at [facility].  °• Tell the ER staff that you are a new post-op gastric bypass or gastric sleeve patient °  °Signs and symptoms to report: • Severe vomiting or nausea °o If you cannot keep down clear liquids for longer than 1 day, call your surgeon  °• Abdominal pain that does not get better after taking your pain medication °• Fever over 100.4° F with chills °• Heart beating over 100 beats a minute °• Shortness of breath at rest °• Chest pain °•  Redness, swelling, drainage, or foul odor at incision (surgical) sites °•  If your incisions open or pull apart °• Swelling or pain in calf (lower leg) °• Diarrhea (Loose bowel movements that happen often), frequent watery, uncontrolled bowel movements °• Constipation, (no bowel movements for 3 days) if this happens: Pick one °o Milk of Magnesia, 2 tablespoons by mouth, 3 times a day for 2 days if needed °o Stop taking Milk of Magnesia once you have a bowel movement °o Call your doctor if constipation continues °Or °o Miralax  (instead of Milk of Magnesia) following the label instructions °o Stop taking Miralax once you have a bowel movement °o Call your doctor if constipation continues °• Anything you think is not normal °  °Normal side effects after surgery: • Unable to sleep at night or unable to focus °• Irritability or moody °• Being tearful (crying) or depressed °These are common complaints, possibly related to your anesthesia medications that put you to sleep, stress of surgery, and change in lifestyle.  This usually goes away a few weeks after surgery.  If these feelings continue, call your primary care doctor. °  °Wound Care: You may have surgical glue, steri-strips, or staples over your incisions after  surgery °• Surgical glue:  Looks like a clear film over your incisions and will wear off a little at a time °• Steri-strips: Strips of tape over your incisions. You may notice a yellowish color on the skin under the steri-strips. This is used to make the   steri-strips stick better. Do not pull the steri-strips off - let them fall off °• Staples: Staples may be removed before you leave the hospital °o If you go home with staples, call [facility], [phone] at for an appointment with your surgeon’s nurse to have staples removed 10 days after surgery. °• Showering: You may shower two (2) days after your surgery unless your surgeon tells you differently °o Wash gently around incisions with warm soapy water, rinse well, and gently pat dry  °o No tub baths until staples are removed, steri-strips fall off or glue is gone.  °  °Medications: • Medications should be liquid or crushed if larger than the size of a dime °• Extended release pills (medication that release a little bit at a time through the day) should NOT be crushed or cut. (examples include XL, ER, DR, SR) °• Depending on the size and number of medications you take, you may need to space (take a few throughout the day)/change the time you take your medications so that you do not over-fill your pouch (smaller stomach) °• Make sure you follow-up with your primary care doctor to   make medication changes needed during rapid weight loss and life-style changes °• If you have diabetes, follow up with the doctor that orders your diabetes medication(s) within one week after surgery and check your blood sugar regularly. °• Do not drive while taking prescription pain medication  °• It is ok to take Tylenol by the bottle instructions with your pain medicine or instead of your pain medicine as needed.  DO NOT TAKE NSAIDS (EXAMPLES OF NSAIDS:  IBUPROFREN/ NAPROXEN)  °Diet:                    First 2 Weeks ° You will see the dietician t about two (2) weeks  after your surgery. The dietician will increase the types of foods you can eat if you are handling liquids well: °• If you have severe vomiting or nausea and cannot keep down clear liquids lasting longer than 1 day, call your surgeon @ ([phone]) °Protein Shake °• Drink at least 2 ounces of shake 5-6 times per day °• Each serving of protein shakes (usually 8 - 12 ounces) should have: °o 15 grams of protein  °o And no more than 5 grams of carbohydrate  °• Goal for protein each day: °o Men = 80 grams per day °o Women = 60 grams per day °• Protein powder may be added to fluids such as non-fat milk or Lactaid milk or unsweetened Soy/Almond milk (limit to 35 grams added protein powder per serving) ° °Hydration °• Slowly increase the amount of water and other clear liquids as tolerated (See Acceptable Fluids) °• Slowly increase the amount of protein shake as tolerated  °•  Sip fluids slowly and throughout the day.  Do not use straws. °• May use sugar substitutes in small amounts (no more than 6 - 8 packets per day; i.e. Splenda) ° °Fluid Goal °• The first goal is to drink at least 8 ounces of protein shake/drink per day (or as directed by the nutritionist); some examples of protein shakes are Syntrax Nectar, Adkins Advantage, EAS Edge HP, and Unjury. See handout from pre-op Bariatric Education Class: °o Slowly increase the amount of protein shake you drink as tolerated °o You may find it easier to slowly sip shakes throughout the day °o It is important to get your proteins in first °• Your fluid goal is to drink 64 - 100 ounces of fluid daily °o It may take a few weeks to build up to this °• 32 oz (or more) should be clear liquids  °And  °• 32 oz (or more) should be full liquids (see below for examples) °• Liquids should not contain sugar, caffeine, or carbonation ° °Clear Liquids: °• Water or Sugar-free flavored water (i.e. Fruit H2O, Propel) °• Decaffeinated coffee or tea (sugar-free) °• Crystal Lite, Wyler’s Lite,  Minute Maid Lite °• Sugar-free Jell-O °• Bouillon or broth °• Sugar-free Popsicle:   *Less than 20 calories each; Limit 1 per day ° °Full Liquids: °Protein Shakes/Drinks + 2 choices per day of other full liquids °• Full liquids must be: °o No More Than 15 grams of Carbs per serving  °o No More Than 3 grams of Fat per serving °• Strained low-fat cream soup (except Cream of Potato or Tomato) °• Non-Fat milk °• Fat-free Lactaid Milk °• Unsweetened Soy Or Unsweetened Almond Milk °• Low Sugar yogurt (Dannon Lite & Fit, Greek yogurt; Oikos Triple Zero; Chobani Simply 100; Yoplait 100 calorie Greek - No Fruit on the Bottom) ° °  °Vitamins   and Minerals • Start 1 day after surgery unless otherwise directed by your surgeon °• 2 Chewable Bariatric Specific Multivitamin / Multimineral Supplement with iron (Example: Bariatric Advantage Multi EA) °• Chewable Calcium with Vitamin D-3 °(Example: 3 Chewable Calcium Plus 600 with Vitamin D-3) °o Take 500 mg three (3) times a day for a total of 1500 mg each day °o Do not take all 3 doses of calcium at one time as it may cause constipation, and you can only absorb 500 mg  at a time  °o Do not mix multivitamins containing iron with calcium supplements; take 2 hours apart °• Menstruating women and those with a history of anemia (a blood disease that causes weakness) may need extra iron °o Talk with your doctor to see if you need more iron °• Do not stop taking or change any vitamins or minerals until you talk to your dietitian or surgeon °• Your Dietitian and/or surgeon must approve all vitamin and mineral supplements °  °Activity and Exercise: Limit your physical activity as instructed by your doctor.  It is important to continue walking at home.  During this time, use these guidelines: °• Do not lift anything greater than ten (10) pounds for at least two (2) weeks °• Do not go back to work or drive until your surgeon says you can °• You may have sex when you feel comfortable  °o It is  VERY important for female patients to use a reliable birth control method; fertility often increases after surgery  °o All hormonal birth control will be ineffective for 30 days after surgery due to medications given during surgery a barrier method must be used. °o Do not get pregnant for at least 18 months °• Start exercising as soon as your doctor tells you that you can °o Make sure your doctor approves any physical activity °• Start with a simple walking program °• Walk 5-15 minutes each day, 7 days per week.  °• Slowly increase until you are walking 30-45 minutes per day °Consider joining our BELT program. [phone] or email [email or web address] °  °Special Instructions Things to remember: °• Use your CPAP when sleeping if this applies to you ° °• [facility] has two free Bariatric Surgery Support Groups that meet monthly °o The 3rd Thursday of each month, 6 pm, [facility]  °o The 2nd Friday of each month, 11:45 am in the private dining room in the basement of [facility] °• It is very important to keep all follow up appointments with your surgeon, dietitian, primary care physician, and behavioral health practitioner °• Routine follow up schedule with your surgeon include appointments at 2-3 weeks, 6-8 weeks, 6 months, and 1 year at a minimum.  Your surgeon may request to see you more often.   °o After the first year, please follow up with your bariatric surgeon and dietitian at least once a year in order to maintain best weight loss results °[facility]: [phone] °[facility]: [phone] °Bariatric Nurse Coordinator: [phone] °  °   Reviewed and Endorsed  °by [place] Patient Education Committee, June, 2016 °Edits Approved: Aug, 2018 ° ° ° °

## 2018-05-02 NOTE — Progress Notes (Addendum)
Patient alert and oriented, pain is controlled. Patient is tolerating fluids, advanced to protein shake today, patient is tolerating well. Reviewed Gastric Bypass discharge instructions with patient and patient is able to articulate understanding.  Lovenox education provided, teach back.   Provided information on BELT program, Support Group and [facility] outpatient pharmacy. All questions answered, will continue to monitor.   Total fluid intake 1180 Per dehydration protocol call back one week post op

## 2018-05-02 NOTE — Progress Notes (Signed)
Pt was discharged home today. Instructions were reviewed with patient, and questions were answered. Pt was taken to main entrance via wheelchair by NT.  

## 2018-05-02 NOTE — Progress Notes (Signed)
Patient alert and oriented, Post op day 1.  Provided support and encouragement.  Encouraged pulmonary toilet, ambulation and small sips of liquids.  Completed 12 ounces of bari clear fluid and 8 ounces of bari clear fluid.  All questions answered.  Will continue to monitor.

## 2018-05-02 NOTE — Discharge Summary (Signed)
Physician Discharge Summary  ADYLENE DLUGOSZ DEY:[id number] DOB: 06/07/1965 DOA: 05/01/2018  PCP: Iona Beard, MD  Admit date: 05/01/2018 Discharge date: 05/02/2018  Recommendations for Outpatient Follow-up:  1.  (include homehealth, outpatient follow-up instructions, specific recommendations for PCP to follow-up on, etc.)  Follow-up Information    Massiah Minjares, Arta Bruce, MD. Go on 05/18/2018.   Specialty:  General Surgery Why:  @ 12 pm Contact information: [address] [place] [address] [phone]        Laurier Jasperson, Arta Bruce, MD .   Specialty:  General Surgery Contact information: [place] [address] [phone]          Discharge Diagnoses:  Active Problems:   Obesity   Surgical Procedure: Roux-en-Y gastric bypass, upper endoscopy  Discharge Condition: Good Disposition: Home  Diet recommendation: Postoperative sleeve gastrectomy diet (liquids only)  Filed Weights   05/01/18 0545  Weight: (!) 148.1 kg     Hospital Course:  The patient was admitted after undergoing Roux-en-Y gastric bypass. POD 0 she ambulated well. POD 1 she was started on the water diet protocol and tolerated 400 ml in the first shift. Once meeting the water amount she was advanced to bariatric protein shakes which they tolerated and were discharged home POD 1.  Treatments: surgery: Roux-en-Y gastric bypass  Discharge Instructions  Discharge Instructions    Ambulate hourly while awake   Complete by:  As directed    Call MD for:  difficulty breathing, headache or visual disturbances   Complete by:  As directed    Call MD for:  persistant dizziness or light-headedness   Complete by:  As directed    Call MD for:  persistant nausea and vomiting   Complete by:  As directed    Call MD for:  redness, tenderness, or signs of infection (pain, swelling, redness, odor or green/yellow discharge around incision site)   Complete by:  As directed     Call MD for:  severe uncontrolled pain   Complete by:  As directed    Call MD for:  temperature >101 F   Complete by:  As directed    DME Other see comment   Complete by:  As directed    20-75mm Hg compression stockings   Diet bariatric full liquid   Complete by:  As directed    Discharge wound care:   Complete by:  As directed    Remove Bandaids tomorrow, ok to shower tomorrow. Steristrips may fall off in 1-3 weeks.   Incentive spirometry   Complete by:  As directed    Perform hourly while awake     Allergies as of 05/02/2018   No Known Allergies     Medication List    STOP taking these medications   aspirin EC 81 MG tablet   Insulin Isophane & Regular Human (70-30) 100 UNIT/ML PEN Commonly known as:  HUMULIN 70/30 KWIKPEN   torsemide 20 MG tablet Commonly known as:  DEMADEX   VICTOZA 18 MG/3ML Sopn Generic drug:  liraglutide     TAKE these medications   acetaminophen 500 MG tablet Commonly known as:  TYLENOL Take 1,000 mg by mouth every 6 (six) hours as needed for moderate pain.   albuterol (2.5 MG/3ML) 0.083% nebulizer solution Commonly known as:  PROVENTIL Take 3 mLs (2.5 mg total) by nebulization every 6 (six) hours as needed for wheezing or shortness of breath.   albuterol 108 (90 Base) MCG/ACT inhaler Commonly known  as:  PROVENTIL HFA;VENTOLIN HFA Inhale 2 puffs into the lungs every 6 (six) hours as needed for wheezing.   atorvastatin 20 MG tablet Commonly known as:  LIPITOR Take 20 mg by mouth daily.   CALCIUM 600+D PO Take 1 tablet by mouth daily.   carboxymethylcellulose 0.5 % Soln Commonly known as:  REFRESH PLUS Place 1 drop into both eyes 3 (three) times daily as needed (dry eyes).   carvedilol 6.25 MG tablet Commonly known as:  COREG Take 6.25 mg by mouth 2 (two) times daily. Notes to patient:  Monitor Blood Pressure Daily and keep a log for primary care physician.  You may need to make changes to your medications with rapid weight loss.      enoxaparin 40 MG/0.4ML injection Commonly known as:  LOVENOX Inject 0.4 mLs (40 mg total) into the skin daily for 14 doses.   Fluticasone-Salmeterol 100-50 MCG/DOSE Aepb Commonly known as:  ADVAIR Inhale 1 puff into the lungs 2 (two) times daily.   gabapentin 300 MG capsule Commonly known as:  NEURONTIN Take 300 mg by mouth 4 (four) times daily. What changed:  Another medication with the same name was added. Make sure you understand how and when to take each.   gabapentin 300 MG capsule Commonly known as:  NEURONTIN Take 1 capsule (300 mg total) by mouth 2 (two) times daily. What changed:  You were already taking a medication with the same name, and this prescription was added. Make sure you understand how and when to take each.   Insulin Glargine 100 UNIT/ML Solostar Pen Commonly known as:  LANTUS SOLOSTAR Inject 10 Units into the skin daily.   insulin lispro 100 UNIT/ML KwikPen Commonly known as:  HUMALOG KWIKPEN Inject 0-0.2 mLs (0-20 Units total) into the skin 3 (three) times daily.   levothyroxine 75 MCG tablet Commonly known as:  SYNTHROID, LEVOTHROID Take 75 mcg by mouth daily before breakfast.   lisinopril 2.5 MG tablet Commonly known as:  PRINIVIL,ZESTRIL Take 2.5 mg by mouth daily. Notes to patient:  Monitor Blood Pressure Daily and keep a log for primary care physician.  You may need to make changes to your medications with rapid weight loss.     montelukast 10 MG tablet Commonly known as:  SINGULAIR Take 10 mg by mouth daily.   ondansetron 4 MG disintegrating tablet Commonly known as:  ZOFRAN-ODT Take 1 tablet (4 mg total) by mouth every 6 (six) hours as needed for nausea or vomiting.   oxyCODONE 5 MG immediate release tablet Commonly known as:  Oxy IR/ROXICODONE Take 1 tablet (5 mg total) by mouth every 6 (six) hours as needed for up to 30 doses for severe pain. 1-2 Tabs PO q6h PRN pain   OXYGEN Inhale 8 L into the lungs daily as needed. Only at bedtime  as needed   pantoprazole 40 MG tablet Commonly known as:  PROTONIX Take 1 tablet (40 mg total) by mouth daily.   Vitamin D 50 MCG (2000 UT) tablet Take 2,000 Units by mouth daily.            Durable Medical Equipment  (From admission, onward)         Start     Ordered   05/02/18 0000  DME Other see comment    Comments:  20-3mm Hg compression stockings   05/02/18 1120           Discharge Care Instructions  (From admission, onward)  Start     Ordered   05/02/18 0000  Discharge wound care:    Comments:  Remove Bandaids tomorrow, ok to shower tomorrow. Steristrips may fall off in 1-3 weeks.   05/02/18 1118         Follow-up Information    Abbie Jablon, Arta Bruce, MD. Go on 05/18/2018.   Specialty:  General Surgery Why:  @ 12 pm Contact information: [address] [place] [address] [phone]        Alana Dayton, Arta Bruce, MD .   Specialty:  General Surgery Contact information: [place] [address] [phone]            The results of significant diagnostics from this hospitalization (including imaging, microbiology, ancillary and laboratory) are listed below for reference.    Significant Diagnostic Studies: No results found.  Labs: Basic Metabolic Panel: Recent Labs  Lab 04/25/18 1427 05/02/18 0505  NA 140 137  K 4.0 4.0  CL 103 106  CO2 26 24  GLUCOSE 141* 203*  BUN 71* 63*  CREATININE 1.79* 1.58*  CALCIUM 9.4 8.6*   Liver Function Tests: Recent Labs  Lab 04/25/18 1427 05/02/18 0505  AST 15 22  ALT 14 19  ALKPHOS 83 76  BILITOT 0.3 0.4  PROT 7.7 6.5  ALBUMIN 3.9 3.1*    CBC: Recent Labs  Lab 04/25/18 1427 05/01/18 1047 05/02/18 0505  WBC 10.6*  --  15.5*  NEUTROABS 7.2  --  13.3*  HGB 11.2* 10.7* 10.4*  HCT 37.2 35.6* 35.3*  MCV 88.8  --  89.6  PLT 227  --  216    CBG: Recent Labs  Lab 05/01/18 1543 05/01/18 2009 05/01/18 2355 05/02/18 0357 05/02/18 0754   GLUCAP 210* 268* 263* 173* 203*    Active Problems:   Obesity   VTE plan: I will prescribe outpatient chemical prophylaxis of enoxaparin due to this increased risk ([email or web address])  Time coordinating discharge: 49min

## 2018-05-02 NOTE — Telephone Encounter (Signed)
Dietitian called pt to assess their understanding of the pre-op nutrition recommendations through the teach back method to ensure the pts knowledge readiness in preparation for surgery.    Pt states she is ready for surgery and has no questions or concerns.

## 2018-05-07 ENCOUNTER — Telehealth (HOSPITAL_COMMUNITY): Payer: Self-pay

## 2018-05-07 NOTE — Telephone Encounter (Signed)
Patient called to discuss post bariatric surgery follow up questions.  See below:   1.  Tell me about your pain and pain management?  2.  Let's talk about fluid intake.  How much total fluid are you taking in?  3.  How much protein have you taken in the last 2 days?  4.  Have you had nausea?  Tell me about when have experienced nausea and what you did to help?  5.  Has the frequency or color changed with your urine?  6.  Tell me what your incisions look like?  7.  Have you been passing gas? BM?  8.  If a problem or question were to arise who would you call?  Do you know contact numbers for [facility], and [facility]?  9.  How has the walking going?  10.  How are your vitamins and calcium going?  How are you taking them?

## 2018-05-15 ENCOUNTER — Other Ambulatory Visit: Payer: Self-pay

## 2018-05-15 ENCOUNTER — Encounter: Payer: Medicare Other | Attending: General Surgery | Admitting: Skilled Nursing Facility1

## 2018-05-15 DIAGNOSIS — I509 Heart failure, unspecified: Secondary | ICD-10-CM | POA: Insufficient documentation

## 2018-05-15 DIAGNOSIS — E119 Type 2 diabetes mellitus without complications: Secondary | ICD-10-CM | POA: Insufficient documentation

## 2018-05-15 DIAGNOSIS — Z7982 Long term (current) use of aspirin: Secondary | ICD-10-CM | POA: Insufficient documentation

## 2018-05-15 DIAGNOSIS — E11649 Type 2 diabetes mellitus with hypoglycemia without coma: Secondary | ICD-10-CM | POA: Diagnosis not present

## 2018-05-15 DIAGNOSIS — I10 Essential (primary) hypertension: Secondary | ICD-10-CM | POA: Diagnosis not present

## 2018-05-15 DIAGNOSIS — J4551 Severe persistent asthma with (acute) exacerbation: Secondary | ICD-10-CM | POA: Diagnosis not present

## 2018-05-15 DIAGNOSIS — Z794 Long term (current) use of insulin: Secondary | ICD-10-CM | POA: Insufficient documentation

## 2018-05-15 DIAGNOSIS — E669 Obesity, unspecified: Secondary | ICD-10-CM | POA: Insufficient documentation

## 2018-05-15 DIAGNOSIS — Z9884 Bariatric surgery status: Secondary | ICD-10-CM | POA: Diagnosis not present

## 2018-05-15 DIAGNOSIS — I110 Hypertensive heart disease with heart failure: Secondary | ICD-10-CM | POA: Insufficient documentation

## 2018-05-15 DIAGNOSIS — J45901 Unspecified asthma with (acute) exacerbation: Secondary | ICD-10-CM | POA: Diagnosis not present

## 2018-05-15 DIAGNOSIS — E785 Hyperlipidemia, unspecified: Secondary | ICD-10-CM | POA: Insufficient documentation

## 2018-05-15 DIAGNOSIS — J9601 Acute respiratory failure with hypoxia: Secondary | ICD-10-CM | POA: Diagnosis not present

## 2018-05-15 DIAGNOSIS — E039 Hypothyroidism, unspecified: Secondary | ICD-10-CM | POA: Diagnosis not present

## 2018-05-15 DIAGNOSIS — Z79899 Other long term (current) drug therapy: Secondary | ICD-10-CM | POA: Diagnosis not present

## 2018-05-17 NOTE — Progress Notes (Signed)
Bariatric Class:  Appt start time: 1530 end time:  1630.  2 Week Post-Operative Nutrition Class  Patient was seen on 05/17/2018 for Post-Operative Nutrition education at the [facility].   Pt needs concise, blunt language to understand the education.  Wants this number to be used: [phone]  Surgery date: 05/01/2018 Surgery type: RYGB Start weight at [facility]: 351.1 Weight today: 332.5  The following the learning objectives were met by the patient during this course:  Identifies Phase 3A (Soft, High Proteins) Dietary Goals and will begin from 2 weeks post-operatively to 2 months post-operatively  Identifies appropriate sources of fluids and proteins   States protein recommendations and appropriate sources post-operatively  Identifies the need for appropriate texture modifications, mastication, and bite sizes when consuming solids  Identifies appropriate multivitamin and calcium sources post-operatively  Describes the need for physical activity post-operatively and will follow MD recommendations  States when to call healthcare provider regarding medication questions or post-operative complications  Handouts given during class include:  Phase 3A: Soft, High Protein Diet Handout  Follow-Up Plan: Patient will follow-up at [facility] in 6 weeks for 2 month post-op nutrition visit for diet advancement per MD.

## 2018-05-17 NOTE — Progress Notes (Signed)
Subjective:  Primary Physician:  Iona Beard, MD  Patient ID: [name], female    DOB: 07-16-1965, 53 y.o.   MRN: [id number]  Chief Complaint  Patient presents with  . Hypertension  . Follow-up    f/u after surgery     HPI: [name]  is a 53 y.o. female  with history of morbid obesity, type II diabetes mellitus which is uncontrolled with stage III-IV chronic kidney disease, hypertension, hyperlipidemia, underwent Roux-en-Y gastric bypass, on 05/01/2018, presents for post procedure f/u requested by Dr Gurney Maxin (Surgery). Patient developed slight worsening dyspnea and significant leg edema about a week ago and her diuretics, furosemide which was discontinued was restarted back and was recommended that she follow-up with me.  Since surgery she has lost about 11 pounds in weight.  After she started furosemide, she has started to feel better, weight has come down and leg edema is improved.  Patient was diagnosed with obstructive sleep apnea however about 3-4 years ago she did not use it and there was some issue with the equipment and since then she has been on nocturnal home oxygen.  No other specific symptoms today and she is excited that she has had gastric bypass.She has normal coronary arteries by angiography in 2015. Patient was last seen by me exactly a year ago.   Past Medical History:  Diagnosis Date  . ARF (acute renal failure) ([place]) 05/03/2014  . Asthma   . Asthma exacerbation 05/03/2014  . Asthma, severe persistent 05/03/2014  . CHF (congestive heart failure) ([place])   . Diabetes mellitus   . Diabetes mellitus type 2 in obese ([place]) 05/03/2014  . History of echocardiogram 09/1015   LVH, diastolic dysfunction  . Hyperlipidemia   . Hypertension   . Hyponatremia 12/02/2014  . Obesity   . SOB (shortness of breath) 12/22/2014    Past Surgical History:  Procedure Laterality Date  . CARDIAC CATHETERIZATION  03/2013   normal coronary arteries, EF 55%  .  CARDIAC CATHETERIZATION  03/2013   normal coronary arteries  . CATARACT EXTRACTION W/PHACO Right 12/10/2012   Procedure: CATARACT EXTRACTION PHACO AND INTRAOCULAR LENS PLACEMENT ([place]);  Surgeon: Tonny Branch, MD;  Location: [facility]S;  Service: Ophthalmology;  Laterality: Right;  CDE:22..42  . CATARACT EXTRACTION W/PHACO Left 05/11/2015   Procedure: CATARACT EXTRACTION PHACO AND INTRAOCULAR LENS PLACEMENT LEFT EYE CDE=6.54;  Surgeon: Tonny Branch, MD;  Location: [facility] ORS;  Service: Ophthalmology;  Laterality: Left;  . COLONOSCOPY N/A 09/22/2017   Procedure: COLONOSCOPY;  Surgeon: Danie Binder, MD;  Location: [facility] ENDO SUITE;  Service: Endoscopy;  Laterality: N/A;  12:15  . EYE SURGERY Left   . GASTRIC BYPASS  05/01/2018  . GASTRIC ROUX-EN-Y N/A 05/01/2018   Procedure: LAPAROSCOPIC ROUX-EN-Y GASTRIC BYPASS WITH UPPER ENDOSCOPY WITH ERAS PATHWAY;  Surgeon: Kinsinger, Arta Bruce, MD;  Location: [facility] ORS;  Service: General;  Laterality: N/A;  . LEFT HEART CATHETERIZATION WITH CORONARY ANGIOGRAM N/A 04/09/2013   Procedure: LEFT HEART CATHETERIZATION WITH CORONARY ANGIOGRAM;  Surgeon: Laverda Page, MD;  [facility] C[facility] LAB;  Service: Cardiovascular;  Laterality: N/A;  . TRACHEOSTOMY     at age 89 from asthma attack    Social History   Socioeconomic History  . Marital status: Single    Spouse name: Not on file  . Number of children: 0  . Years of education: Not on file  . Highest education level: Not on file  Occupational History  . Not on file  Social Needs  . Financial resource strain: Not on file  . Food insecurity:    Worry: Never true    Inability: Never true  . Transportation needs:    Medical: Not on file    Non-medical: Not on file  Tobacco Use  . Smoking status: Never Smoker  . Smokeless tobacco: Never Used  Substance and Sexual Activity  . Alcohol use: Yes    Comment: occasional  . Drug use: No  . Sexual activity: Yes    Birth control/protection: None  Lifestyle  . Physical  activity:    Days per week: Not on file    Minutes per session: Not on file  . Stress: Not on file  Relationships  . Social connections:    Talks on phone: Not on file    Gets together: Not on file    Attends religious service: Not on file    Active member of club or organization: Not on file    Attends meetings of clubs or organizations: Not on file    Relationship status: Not on file  . Intimate partner violence:    Fear of current or ex partner: Not on file    Emotionally abused: Not on file    Physically abused: Not on file    Forced sexual activity: Not on file  Other Topics Concern  . Not on file  Social History Narrative  . Not on file    Current Outpatient Medications on File Prior to Visit  Medication Sig Dispense Refill  . acetaminophen (TYLENOL) 500 MG tablet Take 500 mg by mouth every 6 (six) hours as needed for moderate pain.     Marland Kitchen albuterol (PROVENTIL HFA;VENTOLIN HFA) 108 (90 BASE) MCG/ACT inhaler Inhale 2 puffs into the lungs every 6 (six) hours as needed for wheezing.    Marland Kitchen albuterol (PROVENTIL) (2.5 MG/3ML) 0.083% nebulizer solution Take 3 mLs (2.5 mg total) by nebulization every 6 (six) hours as needed for wheezing or shortness of breath. 75 mL 12  . atorvastatin (LIPITOR) 20 MG tablet Take 20 mg by mouth daily.     . Calcium Carbonate-Vitamin D (CALCIUM 600+D PO) Take 1 tablet by mouth daily.    . carboxymethylcellulose (REFRESH PLUS) 0.5 % SOLN Place 1 drop into both eyes 3 (three) times daily as needed (dry eyes).    . carvedilol (COREG) 6.25 MG tablet Take 6.25 mg by mouth 2 (two) times daily.     . Cholecalciferol (VITAMIN D) 2000 units tablet Take 2,000 Units by mouth daily.     Marland Kitchen enoxaparin (LOVENOX) 40 MG/0.4ML injection Inject 0.4 mLs (40 mg total) into the skin daily for 14 doses. 14 Syringe 0  . Fluticasone-Salmeterol (ADVAIR) 100-50 MCG/DOSE AEPB Inhale 1 puff into the lungs 2 (two) times daily.     Marland Kitchen gabapentin (NEURONTIN) 300 MG capsule Take 300 mg  by mouth 4 (four) times daily.     Marland Kitchen gabapentin (NEURONTIN) 300 MG capsule Take 1 capsule (300 mg total) by mouth 2 (two) times daily. 20 capsule 0  . Insulin Glargine (LANTUS SOLOSTAR) 100 UNIT/ML Solostar Pen Inject 10 Units into the skin daily. 15 mL 11  . insulin lispro (HUMALOG KWIKPEN) 100 UNIT/ML KwikPen Inject 0-0.2 mLs (0-20 Units total) into the skin 3 (three) times daily. 15 mL 11  . levothyroxine (SYNTHROID, LEVOTHROID) 75 MCG tablet Take 75 mcg by mouth daily before breakfast.    . lisinopril (PRINIVIL,ZESTRIL) 2.5 MG tablet Take 2.5 mg by mouth daily.     Marland Kitchen  montelukast (SINGULAIR) 10 MG tablet Take 10 mg by mouth daily.     . ondansetron (ZOFRAN-ODT) 4 MG disintegrating tablet Take 1 tablet (4 mg total) by mouth every 6 (six) hours as needed for nausea or vomiting. 20 tablet 0  . oxyCODONE (OXY IR/ROXICODONE) 5 MG immediate release tablet Take 1 tablet (5 mg total) by mouth every 6 (six) hours as needed for up to 30 doses for severe pain. 1-2 Tabs PO q6h PRN pain 10 tablet 0  . OXYGEN Inhale 8 L into the lungs daily as needed. Only at bedtime as needed    . pantoprazole (PROTONIX) 40 MG tablet Take 1 tablet (40 mg total) by mouth daily. 90 tablet 0  . torsemide (DEMADEX) 20 MG tablet Take 20 mg by mouth daily. Currently take 60mg  daily     No current facility-administered medications on file prior to visit.     Review of Systems  Constitutional: Negative for malaise/fatigue and weight loss.  Respiratory: Positive for shortness of breath (on Exertion, improved on lasix). Negative for cough and hemoptysis.   Cardiovascular: Positive for leg swelling. Negative for chest pain, palpitations and claudication.  Gastrointestinal: Negative for abdominal pain, blood in stool, constipation, heartburn and vomiting.  Genitourinary: Negative for dysuria.  Musculoskeletal: Negative for joint pain and myalgias.  Neurological: Negative for dizziness, focal weakness and headaches.   Endo/Heme/Allergies: Does not bruise/bleed easily.  Psychiatric/Behavioral: The patient is not nervous/anxious.   All other systems reviewed and are negative.      Objective:  Blood pressure (!) 171/81, pulse 79, height 5\' 5"  (1.651 m), weight (!) 317 lb (143.8 kg), last menstrual period 09/11/2013, SpO2 94 %. Body mass index is 52.75 kg/m.  Physical Exam  Constitutional: She appears well-developed. No distress.  Morbidly obese  HENT:  Head: Atraumatic.  Eyes: Conjunctivae are normal.  Neck: Neck supple. No thyromegaly present.  Short neck and difficult to evaluate JVP  Cardiovascular: Normal rate, regular rhythm and normal heart sounds. Exam reveals no gallop.  No murmur heard. Pulses:      Carotid pulses are 2+ on the right side and 2+ on the left side.      Dorsalis pedis pulses are 2+ on the right side and 2+ on the left side.       Posterior tibial pulses are 2+ on the right side and 2+ on the left side.  Femoral and popliteal pulse difficult to feel due to patient's body habitus.   Lower Extremity Inspection - Bilateral - Pigmented and Thick rigid nails, No Varicose ulcers. 2-3 + pitting edema.  Pulmonary/Chest: Effort normal and breath sounds normal.  Abdominal: Soft. Bowel sounds are normal.  Obese. Pannus present  Musculoskeletal: Normal range of motion.  Neurological: She is alert.  Skin: Skin is warm and dry.  Psychiatric: She has a normal mood and affect.   CARDIAC STUDIES:   US Venous Img Lower Unilateral Left 03/13/2015: No evidence of left lower extremity DVT.  Sleep Study 2016: Uses nocturnal O2. Negative sleep study for apnea. On Home O2. Did not tolerate CPAP  Sleep study 2011: Dx'd with sleep apnea-does not use a CPAP. On Nocturnal O2 therapy.  Coronary angiogram 04/09/2013: Normal coronary arteries, normal left ventricle systolic function. Markedly elevated LVEDP of 31 mmHg.  Echo 02/17/11: No apical views. Grossly preserved LVEF. Moderate LVH,  moderate left atrial enlargement.  Lexiscan stress 02/25/11: Mild gut uptake artifact. Normal perfusion. Normal LVEF. Low risk stress    Recent Labs:  CMP Latest Ref Rng & Units 05/02/2018  Glucose 70 - 99 mg/dL 203(H)  BUN 6 - 20 mg/dL 63(H)  Creatinine 0.44 - 1.00 mg/dL 1.58(H)  Sodium 135 - 145 mmol/L 137  Potassium 3.5 - 5.1 mmol/L 4.0  Chloride 98 - 111 mmol/L 106  CO2 22 - 32 mmol/L 24  Calcium 8.9 - 10.3 mg/dL 8.6(L)  Total Protein 6.5 - 8.1 g/dL 6.5  Total Bilirubin 0.3 - 1.2 mg/dL 0.4  Alkaline Phos 38 - 126 U/L 76  AST 15 - 41 U/L 22  ALT 0 - 44 U/L 19   CBC    Component Value Date/Time   WBC 15.5 (H) 05/02/2018 0505   RBC 3.94 05/02/2018 0505   HGB 10.4 (L) 05/02/2018 0505   HCT 35.3 (L) 05/02/2018 0505   PLT 216 05/02/2018 0505   MCV 89.6 05/02/2018 0505   MCH 26.4 05/02/2018 0505   MCHC 29.5 (L) 05/02/2018 0505   RDW 14.0 05/02/2018 0505   LYMPHSABS 1.2 05/02/2018 0505   MONOABS 0.9 05/02/2018 0505   EOSABS 0.0 05/02/2018 0505   BASOSABS 0.0 05/02/2018 0505     Lab Results  Component Value Date   HGBA1C 6.7 (H) 04/25/2018   Lab Results  Component Value Date   TSH 3.363 12/21/2014    Assessment & Recommendations:   Bilateral leg edema - Plan: potassium chloride SA (K-DUR,KLOR-CON) 20 MEQ tablet  Chronic diastolic CHF (congestive heart failure) (HCC) - Plan: potassium chloride SA (K-DUR,KLOR-CON) 20 MEQ tablet  Essential hypertension - Plan: EKG 12-Lead  Class 3 severe obesity due to excess calories with serious comorbidity and body mass index (BMI) of 50.0 to 59.9 in adult [facility])  Diabetes mellitus type 2 in obese ([place])  EKG 11/10/2016: Normal sinus rhythm at rate of 84 bpm, borderline criteria for left atrial enlargement, no evidence of ischemia. Normal EKG.  Recommendation:   Patient presents here to reestablish care, it last seen her a year ago and I had recommended bariatric surgery which is underwent gastric bypass on 05/01/2018.  She  has lost about 20 pounds but mostly fluid, she recuperated fluid and had acute diastolic heart failure, furosemide had to be restarted.  I have advised her to continue furosemide for now, and if leg edema and dyspnea improves over the next few days, to take it only on a p.r.n. basis as she can easily get dehydrated and I'll up worsening renal function.  She does have underlying stage III T4 chronic kidney disease, it has been relatively stable at stage III recently.  She also has chronic anemia.  Otherwise blood pressure is well controlled, EKG is normal, I have added potassium supplements to her furosemide, I'll like to see her back in one month for close monitoring.  Extensive discussion regarding calorie restriction and also watching out for high-calorie diet and smoothies that the high-calorie's was discussed with the patient.  Importance of adhering to eating less as long as she keeps herself well hydrated was reiterated. I'll consider repeating an echocardiogram if her symptoms do not improve.  Adrian Prows, MD, [facility] 05/18/2018, 11:12 AM [facility] [place] Pager: [phone] Office: [phone] If no answer Cell [phone]

## 2018-05-18 ENCOUNTER — Other Ambulatory Visit: Payer: Self-pay

## 2018-05-18 ENCOUNTER — Encounter: Payer: Self-pay | Admitting: Cardiology

## 2018-05-18 ENCOUNTER — Ambulatory Visit (INDEPENDENT_AMBULATORY_CARE_PROVIDER_SITE_OTHER): Payer: Medicare Other | Admitting: Cardiology

## 2018-05-18 VITALS — BP 171/81 | HR 79 | Ht 65.0 in | Wt 317.0 lb

## 2018-05-18 DIAGNOSIS — E1169 Type 2 diabetes mellitus with other specified complication: Secondary | ICD-10-CM | POA: Diagnosis not present

## 2018-05-18 DIAGNOSIS — R600 Localized edema: Secondary | ICD-10-CM

## 2018-05-18 DIAGNOSIS — I5032 Chronic diastolic (congestive) heart failure: Secondary | ICD-10-CM | POA: Diagnosis not present

## 2018-05-18 DIAGNOSIS — Z6841 Body Mass Index (BMI) 40.0 and over, adult: Secondary | ICD-10-CM

## 2018-05-18 DIAGNOSIS — I10 Essential (primary) hypertension: Secondary | ICD-10-CM | POA: Diagnosis not present

## 2018-05-18 DIAGNOSIS — E669 Obesity, unspecified: Secondary | ICD-10-CM

## 2018-05-18 MED ORDER — POTASSIUM CHLORIDE CRYS ER 20 MEQ PO TBCR: 20.0000 meq | EXTENDED_RELEASE_TABLET | Freq: Every day | ORAL | 3 refills | Status: DC | PRN

## 2018-05-22 ENCOUNTER — Telehealth: Payer: Self-pay | Admitting: Skilled Nursing Facility1

## 2018-05-22 NOTE — Telephone Encounter (Signed)
RD called pt to verify fluid intake once starting soft, solid proteins 2 week post-bariatric surgery.   Daily Fluid intake: 60+ Daily Protein intake: 60 with protein shake  Concerns/issues:   Pain in stomach with half beef patty

## 2018-06-12 DIAGNOSIS — E1140 Type 2 diabetes mellitus with diabetic neuropathy, unspecified: Secondary | ICD-10-CM | POA: Diagnosis not present

## 2018-06-12 DIAGNOSIS — L609 Nail disorder, unspecified: Secondary | ICD-10-CM | POA: Diagnosis not present

## 2018-06-12 DIAGNOSIS — L110 Acquired keratosis follicularis: Secondary | ICD-10-CM | POA: Diagnosis not present

## 2018-06-15 ENCOUNTER — Ambulatory Visit: Payer: Medicare Other | Admitting: Cardiology

## 2018-06-15 DIAGNOSIS — J9601 Acute respiratory failure with hypoxia: Secondary | ICD-10-CM | POA: Diagnosis not present

## 2018-06-15 DIAGNOSIS — J45901 Unspecified asthma with (acute) exacerbation: Secondary | ICD-10-CM | POA: Diagnosis not present

## 2018-06-19 DIAGNOSIS — E1169 Type 2 diabetes mellitus with other specified complication: Secondary | ICD-10-CM | POA: Diagnosis not present

## 2018-06-19 DIAGNOSIS — I10 Essential (primary) hypertension: Secondary | ICD-10-CM | POA: Diagnosis not present

## 2018-06-19 DIAGNOSIS — E039 Hypothyroidism, unspecified: Secondary | ICD-10-CM | POA: Diagnosis not present

## 2018-06-21 ENCOUNTER — Ambulatory Visit (INDEPENDENT_AMBULATORY_CARE_PROVIDER_SITE_OTHER): Payer: Medicare Other | Admitting: Cardiology

## 2018-06-21 ENCOUNTER — Encounter: Payer: Self-pay | Admitting: Cardiology

## 2018-06-21 ENCOUNTER — Other Ambulatory Visit: Payer: Self-pay

## 2018-06-21 VITALS — Ht 65.0 in | Wt 311.0 lb

## 2018-06-21 DIAGNOSIS — I10 Essential (primary) hypertension: Secondary | ICD-10-CM

## 2018-06-21 DIAGNOSIS — I5032 Chronic diastolic (congestive) heart failure: Secondary | ICD-10-CM

## 2018-06-21 DIAGNOSIS — I129 Hypertensive chronic kidney disease with stage 1 through stage 4 chronic kidney disease, or unspecified chronic kidney disease: Secondary | ICD-10-CM | POA: Diagnosis not present

## 2018-06-21 DIAGNOSIS — E1169 Type 2 diabetes mellitus with other specified complication: Secondary | ICD-10-CM

## 2018-06-21 DIAGNOSIS — E669 Obesity, unspecified: Secondary | ICD-10-CM

## 2018-06-21 DIAGNOSIS — N183 Chronic kidney disease, stage 3 unspecified: Secondary | ICD-10-CM

## 2018-06-21 HISTORY — DX: Chronic diastolic (congestive) heart failure: I50.32

## 2018-06-21 NOTE — Progress Notes (Signed)
Virtual Visit via Video Note: This visit type was conducted due to national recommendations for restrictions regarding the COVID-19 Pandemic (e.g. social distancing).  This format is felt to be most appropriate for this patient at this time.  All issues noted in this document were discussed and addressed.  No physical exam was performed (except for noted visual exam findings with Telehealth visits).  The patient has consented to conduct a Telehealth visit and understands insurance will be billed.   I connected with@, on 06/21/18 at  by a video enabled telemedicine application and verified that I am speaking with the correct person using two identifiers.   I discussed the limitations of evaluation and management by telemedicine and the availability of in person appointments. The patient expressed understanding and agreed to proceed.   I have discussed with patient regarding the safety during COVID Pandemic and steps and precautions to be taken including social distancing, frequent hand wash and use of detergent soap, gels with the patient. I asked the patient to avoid touching mouth, nose, eyes, ears with the hands. I encouraged regular walking around the neighborhood and exercise and regular diet, as long as social distancing can be maintained.  Primary Physician/Referring:  Iona Beard, MD  Patient ID: [name], female    DOB: 12/12/1965, 53 y.o.   MRN: [id number]  Chief Complaint  Patient presents with   Congestive Heart Failure    4 week f/u    Edema    HPI: [name]  is a 53 y.o. 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 Patient developed slight worsening dyspnea and significant leg edema about a week ago and her diuretics, furosemide  which was discontinued was restarted back and was recommended that she follow-up with me.  Since surgery she has lost about 11 pounds in weight.  After she started furosemide, she has started to feel better, weight has come down and leg edema is improved.  Patient was diagnosed with obstructive sleep apnea however about 3-4 years ago she did not use it and there was some issue with the equipment and since then she has been on nocturnal home oxygen.   She was seen by me a month ago for diastolic CHF and leg edema and today presents for f/u.   States that she is feeling better, breathing is improved but still taking furosemide twice a day.  Since last office visit a month ago, she has only lost about 5-6 pounds in weight.  She has been drinking protein smoothies.  Past Medical History:  Diagnosis Date   Acute asthma exacerbation 12/21/2014   Acute renal failure ([place])    ARF (acute renal failure) ([place]) 05/03/2014   Asthma    Asthma exacerbation 05/03/2014   Asthma, severe persistent 05/03/2014   Chronic diastolic CHF (congestive heart failure) ([place]) 06/21/2018   Diabetes mellitus    Diabetes mellitus type 2 in obese ([place]) 05/03/2014   History of echocardiogram 07/5679   LVH, diastolic dysfunction   Hyperlipidemia    Hypertension    Hyponatremia 12/02/2014   Obesity    Preop examination 05/18/2017   SOB (shortness of breath) 12/22/2014    Past Surgical History:  Procedure Laterality Date   CARDIAC CATHETERIZATION  03/2013   normal coronary arteries, EF 55%   CARDIAC CATHETERIZATION  03/2013  normal coronary arteries   CATARACT EXTRACTION W/PHACO Right 12/10/2012   Procedure: CATARACT EXTRACTION PHACO AND INTRAOCULAR LENS PLACEMENT (IOC);  Surgeon: Tonny Branch, MD;  Location: [facility];  Service: Ophthalmology;  Laterality: Right;  CDE:22..42   CATARACT EXTRACTION W/PHACO Left 05/11/2015   Procedure: CATARACT EXTRACTION PHACO AND INTRAOCULAR LENS PLACEMENT LEFT EYE CDE=6.54;   Surgeon: Tonny Branch, MD;  Location: [facility];  Service: Ophthalmology;  Laterality: Left;   COLONOSCOPY N/A 09/22/2017   Procedure: COLONOSCOPY;  Surgeon: Danie Binder, MD;  Location: [facility];  Service: Endoscopy;  Laterality: N/A;  12:15   EYE SURGERY Left    GASTRIC BYPASS  05/01/2018   GASTRIC ROUX-EN-Y N/A 05/01/2018   Procedure: LAPAROSCOPIC ROUX-EN-Y GASTRIC BYPASS WITH UPPER ENDOSCOPY WITH ERAS PATHWAY;  Surgeon: Kinsinger, Arta Bruce, MD;  Location: [facility];  Service: General;  Laterality: N/A;   LEFT HEART CATHETERIZATION WITH CORONARY ANGIOGRAM N/A 04/09/2013   Procedure: LEFT HEART CATHETERIZATION WITH CORONARY ANGIOGRAM;  Surgeon: Laverda Page, MD;  Location: [facility];  Service: Cardiovascular;  Laterality: N/A;   TRACHEOSTOMY     at age 44 from asthma attack    Social History   Socioeconomic History   Marital status: Single    Spouse name: Not on file   Number of children: 0   Years of education: Not on file   Highest education level: Not on file  Occupational History   Not on file  Social Needs   Financial resource strain: Not on file   Food insecurity:    Worry: Never true    Inability: Never true   Transportation needs:    Medical: Not on file    Non-medical: Not on file  Tobacco Use   Smoking status: Never Smoker   Smokeless tobacco: Never Used  Substance and Sexual Activity   Alcohol use: Yes    Comment: occasional   Drug use: No   Sexual activity: Yes    Birth control/protection: None  Lifestyle   Physical activity:    Days per week: Not on file    Minutes per session: Not on file   Stress: Not on file  Relationships   Social connections:    Talks on phone: Not on file    Gets together: Not on file    Attends religious service: Not on file    Active member of club or organization: Not on file    Attends meetings of clubs or organizations: Not on file    Relationship status: Not on file   Intimate partner violence:     Fear of current or ex partner: Not on file    Emotionally abused: Not on file    Physically abused: Not on file    Forced sexual activity: Not on file  Other Topics Concern   Not on file  Social History Narrative   Not on file    Current Outpatient Medications on File Prior to Visit  Medication Sig Dispense Refill   acetaminophen (TYLENOL) 500 MG tablet Take 500 mg by mouth every 6 (six) hours as needed for moderate pain.      albuterol (PROVENTIL HFA;VENTOLIN HFA) 108 (90 BASE) MCG/ACT inhaler Inhale 2 puffs into the lungs every 6 (six) hours as needed for wheezing.     albuterol (PROVENTIL) (2.5 MG/3ML) 0.083% nebulizer solution Take 3 mLs (2.5 mg total) by nebulization every 6 (six) hours as needed for wheezing or shortness of breath. 75 mL 12   atorvastatin (LIPITOR) 20  MG tablet Take 20 mg by mouth daily.      Calcium Carbonate-Vitamin D (CALCIUM 600+D PO) Take 1 tablet by mouth daily.     Calcium Carbonate-Vitamin D (CALCIUM CREAMIES) 600-400 MG-UNIT chew tablet Chew 1 tablet by mouth 3 (three) times daily with meals.     carboxymethylcellulose (REFRESH PLUS) 0.5 % SOLN Place 1 drop into both eyes 3 (three) times daily as needed (dry eyes).     carvedilol (COREG) 6.25 MG tablet Take 6.25 mg by mouth 2 (two) times daily.      Cholecalciferol (VITAMIN D) 2000 units tablet Take 2,000 Units by mouth daily.      Fluticasone-Salmeterol (ADVAIR) 100-50 MCG/DOSE AEPB Inhale 1 puff into the lungs 2 (two) times daily.      gabapentin (NEURONTIN) 300 MG capsule Take 300 mg by mouth 4 (four) times daily.      Insulin Glargine (LANTUS SOLOSTAR) 100 UNIT/ML Solostar Pen Inject 10 Units into the skin daily. 15 mL 11   insulin lispro (HUMALOG KWIKPEN) 100 UNIT/ML KwikPen Inject 0-0.2 mLs (0-20 Units total) into the skin 3 (three) times daily. 15 mL 11   levothyroxine (SYNTHROID, LEVOTHROID) 75 MCG tablet Take 75 mcg by mouth daily before breakfast.     lisinopril  (PRINIVIL,ZESTRIL) 2.5 MG tablet Take 5 mg by mouth daily.      montelukast (SINGULAIR) 10 MG tablet Take 10 mg by mouth daily.      Multiple Vitamins-Minerals (MULTIVITAMIN WITH MINERALS) tablet Take 1 tablet by mouth daily.     OXYGEN Inhale 8 L into the lungs daily as needed. Only at bedtime as needed     potassium chloride SA (K-DUR,KLOR-CON) 20 MEQ tablet Take 1 tablet (20 mEq total) by mouth daily as needed (With furosemide). 30 tablet 3   torsemide (DEMADEX) 20 MG tablet Take 20 mg by mouth daily. Currently take 60mg  daily     enoxaparin (LOVENOX) 40 MG/0.4ML injection Inject 0.4 mLs (40 mg total) into the skin daily for 14 doses. (Patient not taking: Reported on 06/21/2018) 14 Syringe 0   ondansetron (ZOFRAN-ODT) 4 MG disintegrating tablet Take 1 tablet (4 mg total) by mouth every 6 (six) hours as needed for nausea or vomiting. (Patient not taking: Reported on 06/21/2018) 20 tablet 0   oxyCODONE (OXY IR/ROXICODONE) 5 MG immediate release tablet Take 1 tablet (5 mg total) by mouth every 6 (six) hours as needed for up to 30 doses for severe pain. 1-2 Tabs PO q6h PRN pain (Patient not taking: Reported on 06/21/2018) 10 tablet 0   pantoprazole (PROTONIX) 40 MG tablet Take 1 tablet (40 mg total) by mouth daily. (Patient not taking: Reported on 06/21/2018) 90 tablet 0   No current facility-administered medications on file prior to visit.     Review of Systems  Constitution: Negative for chills, decreased appetite, malaise/fatigue and weight gain.  Cardiovascular: Positive for dyspnea on exertion (stable) and leg swelling (improving). Negative for syncope.  Endocrine: Negative for cold intolerance.  Hematologic/Lymphatic: Does not bruise/bleed easily.  Musculoskeletal: Negative for joint swelling.  Gastrointestinal: Negative for abdominal pain, anorexia and change in bowel habit.  Neurological: Negative for headaches and light-headedness.  Psychiatric/Behavioral: Negative for depression  and substance abuse.  All other systems reviewed and are negative.     Objective:  Height 5' (1.524 m), weight (!) 311 lb (141.1 kg), last menstrual period 09/11/2013. Body mass index is 60.74 kg/m. Limited exam due to virtual visit.  Physical Exam  Constitutional: She is oriented to  person, place, and time. No distress.  Moderately built and moderately obese  Neck: Neck supple.  Pulmonary/Chest: Effort normal.  Neurological: She is alert and oriented to person, place, and time.  Psychiatric: She has a normal mood and affect.   Radiology: No results found. Laboratory Examination:    CMP Latest Ref Rng & Units 05/02/2018 04/25/2018 03/24/2015  Glucose 70 - 99 mg/dL 203(H) 141(H) 180(H)  BUN 6 - 20 mg/dL 63(H) 71(H) 39(H)  Creatinine 0.44 - 1.00 mg/dL 1.58(H) 1.79(H) 1.59(H)  Sodium 135 - 145 mmol/L 137 140 139  Potassium 3.5 - 5.1 mmol/L 4.0 4.0 4.3  Chloride 98 - 111 mmol/L 106 103 102  CO2 22 - 32 mmol/L 24 26 29   Calcium 8.9 - 10.3 mg/dL 8.6(L) 9.4 9.1  Total Protein 6.5 - 8.1 g/dL 6.5 7.7 6.8  Total Bilirubin 0.3 - 1.2 mg/dL 0.4 0.3 0.4  Alkaline Phos 38 - 126 U/L 76 83 80  AST 15 - 41 U/L 22 15 21   ALT 0 - 44 U/L 19 14 15    CBC Latest Ref Rng & Units 05/02/2018 05/01/2018 04/25/2018  WBC 4.0 - 10.5 K/uL 15.5(H) - 10.6(H)  Hemoglobin 12.0 - 15.0 g/dL 10.4(L) 10.7(L) 11.2(L)  Hematocrit 36.0 - 46.0 % 35.3(L) 35.6(L) 37.2  Platelets 150 - 400 K/uL 216 - 227   Lipid Panel  No results found for: CHOL, TRIG, HDL, CHOLHDL, VLDL, LDLCALC, LDLDIRECT HEMOGLOBIN A1C Lab Results  Component Value Date   HGBA1C 6.7 (H) 04/25/2018   MPG 145.59 04/25/2018   TSH No results for input(s): TSH in the last 8760 hours.  Cardiac studies:   US Venous Img Lower Unilateral Left 03/13/2015: No evidence of left lower extremity DVT.  Sleep Study 2016: Uses nocturnal O2. Negative sleep study for apnea. On Home O2. Did not tolerate CPAP  Sleep study 2011: Dx'd with sleep apnea-does not use a  CPAP. On Nocturnal O2 therapy.  Coronary angiogram 04/09/2013: Normal coronary arteries, normal left ventricle systolic function. Markedly elevated LVEDP of 31 mmHg.  Echo 02/17/11: No apical views. Grossly preserved LVEF. Moderate LVH, moderate left atrial enlargement.  Lexiscan stress 02/25/11: Mild gut uptake artifact. Normal perfusion. Normal LVEF. Low risk stress   Assessment:    Essential hypertension  Chronic diastolic CHF (congestive heart failure) (HCC)  Diabetes mellitus type 2 in obese (H[facility])  CKD (chronic kidney disease) stage 3, GFR 30-59 ml/min (H[facility])  EKG 11/10/2016: Normal sinus rhythm at rate of 84 bpm, borderline criteria for left atrial enlargement, no evidence of ischemia. Normal EKG.  Recommendations:    Recommendation:   She underwent gastric bypass on 05/01/2018.  On her last office visit a month ago, I'd given her furosemide which is still taking and in spite of this although leg edema is improved has persistent leg edema.  I suspect dietary noncompliance, patient has been drinking protein shakes.  Extensive discussion regarding calorie restriction and also watching out for high-calorie diet and smoothies that the high-calorie was discussed again.  I will like to see her back in the office in 2 weeks, very concerned that she has lost only 2 pounds since her last office visit with me a month ago  I do not think she is in acute decompensated heart failure. I'll consider repeating an echocardiogram if her symptoms do not improve.  Adrian Prows, MD, [facility] 06/21/2018, 2:29 PM [facility]. [place] Pager: [phone] Office: [phone] If no answer Cell [phone]

## 2018-06-26 ENCOUNTER — Other Ambulatory Visit: Payer: Self-pay

## 2018-06-26 ENCOUNTER — Encounter: Payer: Self-pay | Admitting: Skilled Nursing Facility1

## 2018-06-26 ENCOUNTER — Encounter: Payer: Medicare Other | Attending: General Surgery | Admitting: Skilled Nursing Facility1

## 2018-06-26 DIAGNOSIS — Z794 Long term (current) use of insulin: Secondary | ICD-10-CM | POA: Insufficient documentation

## 2018-06-26 DIAGNOSIS — E669 Obesity, unspecified: Secondary | ICD-10-CM | POA: Insufficient documentation

## 2018-06-26 DIAGNOSIS — E785 Hyperlipidemia, unspecified: Secondary | ICD-10-CM | POA: Insufficient documentation

## 2018-06-26 DIAGNOSIS — Z79899 Other long term (current) drug therapy: Secondary | ICD-10-CM | POA: Diagnosis not present

## 2018-06-26 DIAGNOSIS — Z9884 Bariatric surgery status: Secondary | ICD-10-CM | POA: Diagnosis not present

## 2018-06-26 DIAGNOSIS — Z7982 Long term (current) use of aspirin: Secondary | ICD-10-CM | POA: Insufficient documentation

## 2018-06-26 DIAGNOSIS — E119 Type 2 diabetes mellitus without complications: Secondary | ICD-10-CM | POA: Insufficient documentation

## 2018-06-26 DIAGNOSIS — N183 Chronic kidney disease, stage 3 unspecified: Secondary | ICD-10-CM

## 2018-06-26 DIAGNOSIS — I110 Hypertensive heart disease with heart failure: Secondary | ICD-10-CM | POA: Diagnosis not present

## 2018-06-26 DIAGNOSIS — J4551 Severe persistent asthma with (acute) exacerbation: Secondary | ICD-10-CM | POA: Insufficient documentation

## 2018-06-26 DIAGNOSIS — I509 Heart failure, unspecified: Secondary | ICD-10-CM | POA: Diagnosis not present

## 2018-06-26 NOTE — Progress Notes (Signed)
Bariatric Follow-Up Visit 2 Months Medical Nutrition Therapy  Appt Start Time: 9:45 End Time: 10:15 Primary Concerns Today: Bariatric Surgery Nutrition Follow Up   Bariatric Surgery Type: RYGB    Surgery Date: 05/01/2018   NUTRITION ASSESSMENT   Anthropometrics  Start weight at [facility]: 351.1  lbs  Today's weight: 305.9  lbs Weight change:  27 lbs (since previous nutrition appointment)     Psychosocial/Lifestyle  Pt did not do the body comp scale due to her socks. 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  Pt states cold water hurst her stomach but hates room temperature. Pt was thinking she was not eating enough because she is not eating as much as she was before surgery: Dietitian educated pt on the appropriate amount of food. Pt states she has measuring her foods using actual measuring cups. Pt does live with her mother and kids she is watching so that forms how she eats. Pt has been avoiding high sugar options and stopping from buying high sugar foods. Pt states she does eat rice sometimes eating protein and non starchy veggies along with it and measuring out the rice. Pt sates she is recognizing if she wants more she can have more 3 hours later.  Pt is currently over consuming sugar free options but she is not in a place to change that behavior without under consuming fluid in general.   Pt does have a hx of CKD stage 3 and DM.   Medications: See list Labs:  Supplements:  Unknown multi and calcium   24-Hr Dietary Recall First Meal: boiled hotdog or 2 eggs with french toast with [place] sausage with 1 french toast stick  Snack: cheese  Second Meal: 3 chicken nuggets baked in oven with olive oil spray or 1 hotdog plain Snack: greek yogurt  Third Meal: chicken pot pie just the filling or pork and beans Snack: 2 sugar free popcicles and 1 sugar free jello  Beverages: water and diet dr pepper   Estimated Daily Fluid Intake: 33.8 oz of water with sugar free  flavorings and 1 diet dr pepper Estimated Daily Protein Intake:  g   Physical Activity  Current average weekly physical activity: weights and WII fit: 2 times a week   Signs/Symptoms  Using straws: no Drinking while eating: no Chewing/swallowing difficulties: no Changes in vision: no Changes to mood/headaches: no Hair loss/changes to skin/nails: no Difficulty focusing/concentrating: no Sweating: no Dizziness/lightheadedness: no Palpitations: no Carbonated/caffeinated beverages: no N/V/D/C/Gas: no Abdominal pain: no Dumping syndrome: no     NUTRITION DIAGNOSIS  Overweight/obesity ([place]-3.3) related to past poor dietary habits and physical inactivity as evidenced by patient w/ completed Bariatric surgery following dietary guidelines for continued weight loss and healthy nutrition status.     NUTRITION INTERVENTION Nutrition counseling (C-1) and education (E-2) to facilitate bariatric surgery goals, including:  Diet advancement to the next phase now including non-starchy vegetables  The importance of consuming adequate calories as well as certain nutrients daily due to the body's need for essential vitamins, minerals, and fats  The importance of daily physical activity and to reach a goal of at least 150 minutes of moderate to vigorous physical activity weekly (or as directed by their physician) due to benefits such as increased musculature and improved lab values  Pt Chosen Goals: -Drink from the faucet once the water supply is fixed  -Aim to drink 64 fluid ounces every day -only drink the diet dr pepper 1 time a week -Only  eat hotdog 1 time a week do not eat every day -If you eat oatmeal do NOT add anything to it; do not add splenda  -Do not eat breaded foods  -Do not eat slim jims more than once a week -Continue to aim for a minimum of 64 fluid ounces 7 days a week with at least 30 ounces being plain water -Eat non-starchy vegetables 2 times a day 7 days a week -Start out  with soft cooked vegetables today and tomorrow; if tolerated begin to eat raw vegetables or cooked including salads -Eat your 3 ounces of protein first then start in on your non-starchy vegetables; once you understand how much of your meal leads to satisfaction and not full while still eating 3 ounces of protein and non-starchy vegetables you can eat them in any order  -Continue to aim for 30 minutes of activity at least 5 times a week -Do NOT cook with/add to your food: alfredo sauce, cheese sauce, barbeque sauce, ketchup, fat back, butter, bacon grease, grease, Crisco  -Only use half a dressing packet for your zaxby salad and do not get fried chicken Do not eat oodles of noodles yet Increase exercise 3 days a week   Handouts Provided Include  Non starchy veggies + protein   Readiness for Change: ready  Demonstrated degree of understanding via: Teach Back     MONITORING & EVALUATION Dietary intake, weekly physical activity, and body weight follow up in 2 months

## 2018-06-26 NOTE — Patient Instructions (Addendum)
-  Continue to aim for a minimum of 64 fluid ounces 7 days a week with at least 30 ounces being plain water  -Eat non-starchy vegetables 2 times a day 7 days a week  -Start out with soft cooked vegetables today and tomorrow; if tolerated begin to eat raw vegetables or cooked including salads  -Eat your 3 ounces of protein first then start in on your non-starchy vegetables; once you understand how much of your meal leads to satisfaction and not full while still eating 3 ounces of protein and non-starchy vegetables you can eat them in any order   -Continue to aim for 30 minutes of activity at least 5 times a week  -Do NOT cook with/add to your food: alfredo sauce, cheese sauce, barbeque sauce, ketchup, fat back, butter, bacon grease, grease, Crisco   -Drink from the faucet once the water supply is fixed  -Aim to drink 64 fluid ounces every day -only drink the diet dr pepper 1 time a week -Only eat hotdog 1 time a week do not eat every day -If you eat oatmeal do NOT add anything to it; do not add splenda  -Do not eat breaded foods  -Do not eat slim jims more than once a week  Do not eat oodles of noodles yet  Increase exercise 3 days a week

## 2018-07-06 ENCOUNTER — Encounter: Payer: Self-pay | Admitting: Cardiology

## 2018-07-06 ENCOUNTER — Other Ambulatory Visit: Payer: Self-pay

## 2018-07-06 ENCOUNTER — Ambulatory Visit (INDEPENDENT_AMBULATORY_CARE_PROVIDER_SITE_OTHER): Payer: Medicare Other | Admitting: Cardiology

## 2018-07-06 VITALS — BP 140/63 | HR 63 | Ht 61.0 in | Wt 303.0 lb

## 2018-07-06 DIAGNOSIS — I5032 Chronic diastolic (congestive) heart failure: Secondary | ICD-10-CM | POA: Diagnosis not present

## 2018-07-06 DIAGNOSIS — I10 Essential (primary) hypertension: Secondary | ICD-10-CM

## 2018-07-06 DIAGNOSIS — Z6841 Body Mass Index (BMI) 40.0 and over, adult: Secondary | ICD-10-CM | POA: Diagnosis not present

## 2018-07-06 NOTE — Progress Notes (Signed)
Primary Physician/Referring:  Iona Beard, MD  Patient ID: [name], female    DOB: 1965-11-24, 54 y.o.   MRN: [id number]  Chief Complaint  Patient presents with  . Congestive Heart Failure  . Hypertension  . Follow-up    HPI: [name]  is a 53 y.o. 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  Since surgery she has lost about 11 pounds in weight.  She was switched from Lasix to Torsemide by nephrology. Dyspnea has improved and stable, leg edema is improving.   Patient was diagnosed with obstructive sleep apnea however about 3-4 years ago she did not use it and there was some issue with the equipment and since then she has been on nocturnal home oxygen.   She was seen by me a month ago for diastolic CHF and leg edema and again virtual visit 2 weeks ago and due to concern of weight gain and edema and CHF, I am seeing her today. Dyspnea is better.  On her last office visit I had advised her not to drink anymore Smokies and also protein drinks and protein shakes.  She has been avoiding this.  Past Medical History:  Diagnosis Date  . Acute asthma exacerbation 12/21/2014  . Acute renal failure ([place])   . ARF (acute renal failure) ([place]) 05/03/2014  . Asthma   . Asthma exacerbation 05/03/2014  . Asthma, severe persistent 05/03/2014  . Chronic diastolic CHF (congestive heart failure) ([place]) 06/21/2018  . Diabetes mellitus   . Diabetes mellitus type 2 in obese ([place]) 05/03/2014  . History of echocardiogram 09/5883   LVH, diastolic dysfunction  . Hyperlipidemia   . Hypertension   . Hyponatremia 12/02/2014  . Obesity   . Preop examination 05/18/2017  . SOB (shortness of breath) 12/22/2014    Past Surgical History:  Procedure Laterality Date  .  CARDIAC CATHETERIZATION  03/2013   normal coronary arteries, EF 55%  . CARDIAC CATHETERIZATION  03/2013   normal coronary arteries  . CATARACT EXTRACTION W/PHACO Right 12/10/2012   Procedure: CATARACT EXTRACTION PHACO AND INTRAOCULAR LENS PLACEMENT ([place]);  Surgeon: Tonny Branch, MD;  Location: [facility];  Service: Ophthalmology;  Laterality: Right;  CDE:22..42  . CATARACT EXTRACTION W/PHACO Left 05/11/2015   Procedure: CATARACT EXTRACTION PHACO AND INTRAOCULAR LENS PLACEMENT LEFT EYE CDE=6.54;  Surgeon: Tonny Branch, MD;  Location: [facility];  Service: Ophthalmology;  Laterality: Left;  . COLONOSCOPY N/A 09/22/2017   Procedure: COLONOSCOPY;  Surgeon: Danie Binder, MD;  Location: [facility] SUITE;  Service: Endoscopy;  Laterality: N/A;  12:15  . EYE SURGERY Left   . GASTRIC BYPASS  05/01/2018  . GASTRIC ROUX-EN-Y N/A 05/01/2018   Procedure: LAPAROSCOPIC ROUX-EN-Y GASTRIC BYPASS WITH UPPER ENDOSCOPY WITH ERAS PATHWAY;  Surgeon: Kinsinger, Arta Bruce, MD;  Location: [facility];  Service: General;  Laterality: N/A;  . LEFT HEART CATHETERIZATION WITH CORONARY ANGIOGRAM N/A 04/09/2013   Procedure: LEFT HEART CATHETERIZATION WITH CORONARY ANGIOGRAM;  Surgeon: Laverda Page, MD;  Location: [facility] CATH LAB;  Service: Cardiovascular;  Laterality: N/A;  . TRACHEOSTOMY     at age 67 from asthma attack    Social History   Socioeconomic History  . Marital status: Single    Spouse name: Not on file  . Number of children: 0  . Years of education: Not on file  .  Highest education level: Not on file  Occupational History  . Not on file  Social Needs  . Financial resource strain: Not on file  . Food insecurity:    Worry: Never true    Inability: Never true  . Transportation needs:    Medical: Not on file    Non-medical: Not on file  Tobacco Use  . Smoking status: Never Smoker  . Smokeless tobacco: Never Used  Substance and Sexual Activity  . Alcohol use: Not Currently  . Drug use: No  . Sexual activity: Yes     Birth control/protection: None  Lifestyle  . Physical activity:    Days per week: Not on file    Minutes per session: Not on file  . Stress: Not on file  Relationships  . Social connections:    Talks on phone: Not on file    Gets together: Not on file    Attends religious service: Not on file    Active member of club or organization: Not on file    Attends meetings of clubs or organizations: Not on file    Relationship status: Not on file  . Intimate partner violence:    Fear of current or ex partner: Not on file    Emotionally abused: Not on file    Physically abused: Not on file    Forced sexual activity: Not on file  Other Topics Concern  . Not on file  Social History Narrative  . Not on file    Current Outpatient Medications on File Prior to Visit  Medication Sig Dispense Refill  . acetaminophen (TYLENOL) 500 MG tablet Take 500 mg by mouth every 6 (six) hours as needed for moderate pain.     Marland Kitchen albuterol (PROVENTIL HFA;VENTOLIN HFA) 108 (90 BASE) MCG/ACT inhaler Inhale 2 puffs into the lungs every 6 (six) hours as needed for wheezing.    Marland Kitchen albuterol (PROVENTIL) (2.5 MG/3ML) 0.083% nebulizer solution Take 3 mLs (2.5 mg total) by nebulization every 6 (six) hours as needed for wheezing or shortness of breath. 75 mL 12  . atorvastatin (LIPITOR) 20 MG tablet Take 20 mg by mouth daily.     . Calcium Carbonate-Vitamin D (CALCIUM 600+D PO) Take 1 tablet by mouth daily.    . Calcium Carbonate-Vitamin D (CALCIUM CREAMIES) 600-400 MG-UNIT chew tablet Chew 1 tablet by mouth 3 (three) times daily with meals.    . carvedilol (COREG) 6.25 MG tablet Take 6.25 mg by mouth 2 (two) times daily.     . Fluticasone-Salmeterol (ADVAIR) 100-50 MCG/DOSE AEPB Inhale 1 puff into the lungs 2 (two) times daily.     Marland Kitchen gabapentin (NEURONTIN) 300 MG capsule Take 300 mg by mouth 4 (four) times daily.     . Insulin Glargine (LANTUS SOLOSTAR) 100 UNIT/ML Solostar Pen Inject 10 Units into the skin daily. 15 mL 11   . insulin lispro (HUMALOG KWIKPEN) 100 UNIT/ML KwikPen Inject 0-0.2 mLs (0-20 Units total) into the skin 3 (three) times daily. 15 mL 11  . levothyroxine (SYNTHROID, LEVOTHROID) 75 MCG tablet Take 75 mcg by mouth daily before breakfast.    . lisinopril (PRINIVIL,ZESTRIL) 2.5 MG tablet Take 5 mg by mouth daily.     . montelukast (SINGULAIR) 10 MG tablet Take 10 mg by mouth daily.     . Multiple Vitamins-Minerals (MULTIVITAMIN WITH MINERALS) tablet Take 1 tablet by mouth daily.    . OXYGEN Inhale 8 L into the lungs daily as needed. Only at bedtime as needed    .  pantoprazole (PROTONIX) 40 MG tablet Take 1 tablet (40 mg total) by mouth daily. 90 tablet 0  . potassium chloride SA (K-DUR,KLOR-CON) 20 MEQ tablet Take 1 tablet (20 mEq total) by mouth daily as needed (With furosemide). 30 tablet 3  . torsemide (DEMADEX) 20 MG tablet Take 20 mg by mouth 2 (two) times a day. Two tablets in morning and one in the evening    . carboxymethylcellulose (REFRESH PLUS) 0.5 % SOLN Place 1 drop into both eyes 3 (three) times daily as needed (dry eyes).    . Cholecalciferol (VITAMIN D) 2000 units tablet Take 2,000 Units by mouth daily.     Marland Kitchen enoxaparin (LOVENOX) 40 MG/0.4ML injection Inject 0.4 mLs (40 mg total) into the skin daily for 14 doses. (Patient not taking: Reported on 06/21/2018) 14 Syringe 0  . ondansetron (ZOFRAN-ODT) 4 MG disintegrating tablet Take 1 tablet (4 mg total) by mouth every 6 (six) hours as needed for nausea or vomiting. (Patient not taking: Reported on 06/21/2018) 20 tablet 0  . oxyCODONE (OXY IR/ROXICODONE) 5 MG immediate release tablet Take 1 tablet (5 mg total) by mouth every 6 (six) hours as needed for up to 30 doses for severe pain. 1-2 Tabs PO q6h PRN pain (Patient not taking: Reported on 06/21/2018) 10 tablet 0   No current facility-administered medications on file prior to visit.     Review of Systems  Constitution: Negative for chills, decreased appetite, malaise/fatigue and weight  gain.  Cardiovascular: Positive for dyspnea on exertion (stable) and leg swelling (improving). Negative for syncope.  Endocrine: Negative for cold intolerance.  Hematologic/Lymphatic: Does not bruise/bleed easily.  Musculoskeletal: Negative for joint swelling.  Gastrointestinal: Negative for abdominal pain, anorexia and change in bowel habit.  Neurological: Negative for headaches and light-headedness.  Psychiatric/Behavioral: Negative for depression and substance abuse.  All other systems reviewed and are negative.     Objective:  Blood pressure 140/63, pulse 63, height 5\' 1"  (1.549 m), weight (!) 303 lb (137.4 kg), last menstrual period 09/11/2013, SpO2 100 %. Body mass index is 57.25 kg/m. Limited exam due to virtual visit.  Physical Exam  Constitutional: She appears well-developed. No distress.  Morbidly obese  HENT:  Head: Atraumatic.  Eyes: Conjunctivae are normal.  Neck: Neck supple. No thyromegaly present.  Short neck and difficult to evaluate JVP  Cardiovascular: Normal rate, regular rhythm and normal heart sounds. Exam reveals no gallop.  No murmur heard. Pulses:      Carotid pulses are 2+ on the right side and 2+ on the left side.      Dorsalis pedis pulses are 2+ on the right side and 2+ on the left side.       Posterior tibial pulses are 2+ on the right side and 2+ on the left side.  Femoral and popliteal pulse difficult to feel due to patient's body habitus. Chronic venostasis changes noted in the lower extremity along with 2+ pitting edema.  Skin has the appearance of a Peu-de-orange.  Pulmonary/Chest: Effort normal and breath sounds normal.  Abdominal: Soft. Bowel sounds are normal.  Obese. Pannus present  Musculoskeletal: Normal range of motion.  Neurological: She is alert.  Skin: Skin is warm and dry.  Psychiatric: She has a normal mood and affect.   Radiology: No results found. Laboratory Examination:    CMP Latest Ref Rng & Units 05/02/2018 04/25/2018  03/24/2015  Glucose 70 - 99 mg/dL 203(H) 141(H) 180(H)  BUN 6 - 20 mg/dL 63(H) 71(H) 39(H)  Creatinine 0.44 -  1.00 mg/dL 1.58(H) 1.79(H) 1.59(H)  Sodium 135 - 145 mmol/L 137 140 139  Potassium 3.5 - 5.1 mmol/L 4.0 4.0 4.3  Chloride 98 - 111 mmol/L 106 103 102  CO2 22 - 32 mmol/L 24 26 29   Calcium 8.9 - 10.3 mg/dL 8.6(L) 9.4 9.1  Total Protein 6.5 - 8.1 g/dL 6.5 7.7 6.8  Total Bilirubin 0.3 - 1.2 mg/dL 0.4 0.3 0.4  Alkaline Phos 38 - 126 U/L 76 83 80  AST 15 - 41 U/L 22 15 21   ALT 0 - 44 U/L 19 14 15    CBC Latest Ref Rng & Units 05/02/2018 05/01/2018 04/25/2018  WBC 4.0 - 10.5 K/uL 15.5(H) - 10.6(H)  Hemoglobin 12.0 - 15.0 g/dL 10.4(L) 10.7(L) 11.2(L)  Hematocrit 36.0 - 46.0 % 35.3(L) 35.6(L) 37.2  Platelets 150 - 400 K/uL 216 - 227   Lipid Panel  No results found for: CHOL, TRIG, HDL, CHOLHDL, VLDL, LDLCALC, LDLDIRECT HEMOGLOBIN A1C Lab Results  Component Value Date   HGBA1C 6.7 (H) 04/25/2018   MPG 145.59 04/25/2018   TSH No results for input(s): TSH in the last 8760 hours.  Cardiac studies:   US Venous Img Lower Unilateral Left 03/13/2015: No evidence of left lower extremity DVT. 55 Sleep Study 2016: Uses nocturnal O2. Negative sleep study for apnea. On Home O2. Did not tolerate CPAP  Sleep study 2011: Dx'd with sleep apnea-does not use a CPAP. On Nocturnal O2 therapy.  Coronary angiogram 04/09/2013: Normal coronary arteries, normal left ventricle systolic function. Markedly elevated LVEDP of 31 mmHg.  Echo 02/17/11: No apical views. Grossly preserved LVEF. Moderate LVH, moderate left atrial enlargement.  Lexiscan stress 02/25/11: Mild gut uptake artifact. Normal perfusion. Normal LVEF. Low risk stress   Assessment:    Chronic diastolic CHF (congestive heart failure) (HCC)  Essential hypertension  Class 3 severe obesity due to excess calories with serious comorbidity and body mass index (BMI) of 50.0 to 59.9 in adult [facility])  EKG 11/10/2016: Normal sinus rhythm at  rate of 84 bpm, borderline criteria for left atrial enlargement, no evidence of ischemia. Normal EKG.  Recommendations:    Recommendation:   She underwent gastric bypass on 05/01/2018.  Over the past 2 weeks has lost about 3 Lbs. Leg edema is improving gradually. I am hoping she will loose weight and will adhere to diet.  I do not think she is in acute decompensated heart failure. No change in dyspnea. No chest pain or PND or orthopnea. I will see her in 4 weeks. She will continue present dose diuretics (torsemide 40 mg am and 20 mg PM). She does have stage 3 CKD. BP is controlled relative to her. Do not want to bring BP down in view of potential worsening of renal function with aggressive diuresis.   Adrian Prows, MD, [facility] 07/06/2018, 10:07 AM [facility] [place] Pager: [phone] Office: [phone] If no answer Cell [phone]

## 2018-07-15 DIAGNOSIS — J9601 Acute respiratory failure with hypoxia: Secondary | ICD-10-CM | POA: Diagnosis not present

## 2018-07-15 DIAGNOSIS — J45901 Unspecified asthma with (acute) exacerbation: Secondary | ICD-10-CM | POA: Diagnosis not present

## 2018-07-31 DIAGNOSIS — I10 Essential (primary) hypertension: Secondary | ICD-10-CM | POA: Diagnosis not present

## 2018-07-31 DIAGNOSIS — E1169 Type 2 diabetes mellitus with other specified complication: Secondary | ICD-10-CM | POA: Diagnosis not present

## 2018-07-31 DIAGNOSIS — E1165 Type 2 diabetes mellitus with hyperglycemia: Secondary | ICD-10-CM | POA: Diagnosis not present

## 2018-07-31 DIAGNOSIS — E039 Hypothyroidism, unspecified: Secondary | ICD-10-CM | POA: Diagnosis not present

## 2018-08-10 ENCOUNTER — Other Ambulatory Visit: Payer: Self-pay

## 2018-08-10 ENCOUNTER — Encounter: Payer: Self-pay | Admitting: Cardiology

## 2018-08-10 ENCOUNTER — Ambulatory Visit (INDEPENDENT_AMBULATORY_CARE_PROVIDER_SITE_OTHER): Payer: Medicare Other | Admitting: Cardiology

## 2018-08-10 VITALS — BP 160/63 | HR 58 | Ht 60.0 in | Wt 285.0 lb

## 2018-08-10 DIAGNOSIS — I5032 Chronic diastolic (congestive) heart failure: Secondary | ICD-10-CM | POA: Diagnosis not present

## 2018-08-10 DIAGNOSIS — I129 Hypertensive chronic kidney disease with stage 1 through stage 4 chronic kidney disease, or unspecified chronic kidney disease: Secondary | ICD-10-CM

## 2018-08-10 DIAGNOSIS — N183 Chronic kidney disease, stage 3 unspecified: Secondary | ICD-10-CM

## 2018-08-10 DIAGNOSIS — Z6841 Body Mass Index (BMI) 40.0 and over, adult: Secondary | ICD-10-CM

## 2018-08-10 DIAGNOSIS — I10 Essential (primary) hypertension: Secondary | ICD-10-CM

## 2018-08-10 MED ORDER — AMLODIPINE BESYLATE 5 MG PO TABS: 5.0000 mg | ORAL_TABLET | Freq: Every day | ORAL | 1 refills | Status: DC

## 2018-08-10 NOTE — Progress Notes (Signed)
Primary Physician/Referring:  Iona Beard, MD  Patient ID: [name], female    DOB: 12-05-1965, 53 y.o.   MRN: [id number]  Chief Complaint  Patient presents with  . Congestive Heart Failure  . Leg Swelling  . Shortness of Breath    HPI: [name]  is a 53 y.o. 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 She has normal coronary arteries by angiography in 2015.  She presents for follow-up of diastolic heart failure and hypertension.  She has become more compliant with diet, has lost some weight since last office visit.  I'm seeing her frequently to improve compliance.  States that her dyspnea is improving.  No chest pain or palpitations.  No change in her leg edema.  Past Medical History:  Diagnosis Date  . Acute asthma exacerbation 12/21/2014  . Acute renal failure ([place])   . ARF (acute renal failure) ([place]) 05/03/2014  . Asthma   . Asthma exacerbation 05/03/2014  . Asthma, severe persistent 05/03/2014  . Chronic diastolic CHF (congestive heart failure) ([place]) 06/21/2018  . Diabetes mellitus   . Diabetes mellitus type 2 in obese ([place]) 05/03/2014  . History of echocardiogram 08/348   LVH, diastolic dysfunction  . Hyperlipidemia   . Hypertension   . Hyponatremia 12/02/2014  . Obesity   . Preop examination 05/18/2017  . SOB (shortness of breath) 12/22/2014    Past Surgical History:  Procedure Laterality Date  . CARDIAC CATHETERIZATION  03/2013   normal coronary arteries, EF 55%  . CARDIAC CATHETERIZATION  03/2013   normal coronary arteries  . CATARACT EXTRACTION W/PHACO Right 12/10/2012   Procedure: CATARACT EXTRACTION PHACO AND INTRAOCULAR LENS PLACEMENT ([place]);  Surgeon: Tonny Branch, MD;  Location: [facility];  Service: Ophthalmology;  Laterality:  Right;  CDE:22..42  . CATARACT EXTRACTION W/PHACO Left 05/11/2015   Procedure: CATARACT EXTRACTION PHACO AND INTRAOCULAR LENS PLACEMENT LEFT EYE CDE=6.54;  Surgeon: Tonny Branch, MD;  Location: [facility];  Service: Ophthalmology;  Laterality: Left;  . COLONOSCOPY N/A 09/22/2017   Procedure: COLONOSCOPY;  Surgeon: Danie Binder, MD;  Location: [facility] SUITE;  Service: Endoscopy;  Laterality: N/A;  12:15  . EYE SURGERY Left   . GASTRIC BYPASS  05/01/2018  . GASTRIC ROUX-EN-Y N/A 05/01/2018   Procedure: LAPAROSCOPIC ROUX-EN-Y GASTRIC BYPASS WITH UPPER ENDOSCOPY WITH ERAS PATHWAY;  Surgeon: Kinsinger, Arta Bruce, MD;  Location: [facility];  Service: General;  Laterality: N/A;  . LEFT HEART CATHETERIZATION WITH CORONARY ANGIOGRAM N/A 04/09/2013   Procedure: LEFT HEART CATHETERIZATION WITH CORONARY ANGIOGRAM;  Surgeon: Laverda Page, MD;  Location: [facility] CATH LAB;  Service: Cardiovascular;  Laterality: N/A;  . TRACHEOSTOMY     at age 35 from asthma attack    Social History   Socioeconomic History  . Marital status: Single    Spouse name: Not on file  . Number of children: 0  . Years of education: Not on file  . Highest education level: Not on file  Occupational History  . Not on file  Social Needs  . Financial resource strain: Not on file  . Food insecurity    Worry: Never true    Inability: Never true  . Transportation needs    Medical: Not on file    Non-medical: Not on file  Tobacco Use  . Smoking status: Never Smoker  .  Smokeless tobacco: Never Used  Substance and Sexual Activity  . Alcohol use: Not Currently  . Drug use: No  . Sexual activity: Yes    Birth control/protection: None  Lifestyle  . Physical activity    Days per week: Not on file    Minutes per session: Not on file  . Stress: Not on file  Relationships  . Social Herbalist on phone: Not on file    Gets together: Not on file    Attends religious service: Not on file    Active member of club or organization:  Not on file    Attends meetings of clubs or organizations: Not on file    Relationship status: Not on file  . Intimate partner violence    Fear of current or ex partner: Not on file    Emotionally abused: Not on file    Physically abused: Not on file    Forced sexual activity: Not on file  Other Topics Concern  . Not on file  Social History Narrative  . Not on file    Current Outpatient Medications on File Prior to Visit  Medication Sig Dispense Refill  . acetaminophen (TYLENOL) 500 MG tablet Take 500 mg by mouth every 6 (six) hours as needed for moderate pain.     Marland Kitchen albuterol (PROVENTIL HFA;VENTOLIN HFA) 108 (90 BASE) MCG/ACT inhaler Inhale 2 puffs into the lungs every 6 (six) hours as needed for wheezing.    Marland Kitchen albuterol (PROVENTIL) (2.5 MG/3ML) 0.083% nebulizer solution Take 3 mLs (2.5 mg total) by nebulization every 6 (six) hours as needed for wheezing or shortness of breath. 75 mL 12  . atorvastatin (LIPITOR) 20 MG tablet Take 20 mg by mouth daily.     . Calcium Carbonate-Vitamin D (CALCIUM 600+D PO) Take 1 tablet by mouth daily.    . carboxymethylcellulose (REFRESH PLUS) 0.5 % SOLN Place 1 drop into both eyes 3 (three) times daily as needed (dry eyes).    . carvedilol (COREG) 6.25 MG tablet Take 6.25 mg by mouth 2 (two) times daily.     . Cholecalciferol (VITAMIN D) 2000 units tablet Take 2,000 Units by mouth daily.     . Fluticasone-Salmeterol (ADVAIR) 100-50 MCG/DOSE AEPB Inhale 1 puff into the lungs 2 (two) times daily.     Marland Kitchen gabapentin (NEURONTIN) 300 MG capsule Take 300 mg by mouth 4 (four) times daily.     . Insulin Glargine (LANTUS SOLOSTAR) 100 UNIT/ML Solostar Pen Inject 10 Units into the skin daily. 15 mL 11  . insulin lispro (HUMALOG KWIKPEN) 100 UNIT/ML KwikPen Inject 0-0.2 mLs (0-20 Units total) into the skin 3 (three) times daily. 15 mL 11  . levothyroxine (SYNTHROID, LEVOTHROID) 75 MCG tablet Take 75 mcg by mouth daily before breakfast.    . lisinopril (ZESTRIL) 10 MG  tablet Take 10 mg by mouth daily.     . montelukast (SINGULAIR) 10 MG tablet Take 10 mg by mouth daily.     . Multiple Vitamins-Minerals (MULTIVITAMIN WITH MINERALS) tablet Take 1 tablet by mouth daily.    . OXYGEN Inhale 8 L into the lungs daily as needed. Only at bedtime as needed    . pantoprazole (PROTONIX) 40 MG tablet Take 1 tablet (40 mg total) by mouth daily. 90 tablet 0  . potassium chloride SA (K-DUR,KLOR-CON) 20 MEQ tablet Take 1 tablet (20 mEq total) by mouth daily as needed (With furosemide). 30 tablet 3  . torsemide (DEMADEX) 20 MG tablet Take 20  mg by mouth 2 (two) times a day. Two tablets in morning and one in the evening     No current facility-administered medications on file prior to visit.    Review of Systems  Constitution: Negative for chills, decreased appetite, malaise/fatigue and weight gain.  Cardiovascular: Positive for dyspnea on exertion (stable) and leg swelling (improving). Negative for syncope.  Endocrine: Negative for cold intolerance.  Hematologic/Lymphatic: Does not bruise/bleed easily.  Musculoskeletal: Negative for joint swelling.  Gastrointestinal: Negative for abdominal pain, anorexia and change in bowel habit.  Neurological: Negative for headaches and light-headedness.  Psychiatric/Behavioral: Negative for depression and substance abuse.  All other systems reviewed and are negative.     Objective:  Blood pressure (!) 160/63, pulse (!) 58, height 5' (1.524 m), weight 285 lb (129.3 kg), last menstrual period 09/11/2013, SpO2 97 %. Body mass index is 55.66 kg/m.  Physical Exam  Constitutional: She appears well-developed. No distress.  Morbidly obese  HENT:  Head: Atraumatic.  Eyes: Conjunctivae are normal.  Neck: Neck supple. No thyromegaly present.  Short neck and difficult to evaluate JVP  Cardiovascular: Normal rate, regular rhythm and normal heart sounds. Exam reveals no gallop.  No murmur heard. Pulses:      Carotid pulses are 2+ on the  right side and 2+ on the left side.      Dorsalis pedis pulses are 2+ on the right side and 2+ on the left side.       Posterior tibial pulses are 2+ on the right side and 2+ on the left side.  Femoral and popliteal pulse difficult to feel due to patient's body habitus. Chronic venostasis changes noted in the lower extremity along with 2+ pitting edema.  Skin has the appearance of a Peu-de-orange. 1-2 + pitting edema also present.  Pulmonary/Chest: Effort normal and breath sounds normal.  Abdominal: Soft. Bowel sounds are normal.  Obese. Pannus present  Musculoskeletal: Normal range of motion.  Neurological: She is alert.  Skin: Skin is warm and dry.  Psychiatric: She has a normal mood and affect.   Radiology: No results found. Laboratory Examination:   CMP Latest Ref Rng & Units 05/02/2018 04/25/2018 03/24/2015  Glucose 70 - 99 mg/dL 203(H) 141(H) 180(H)  BUN 6 - 20 mg/dL 63(H) 71(H) 39(H)  Creatinine 0.44 - 1.00 mg/dL 1.58(H) 1.79(H) 1.59(H)  Sodium 135 - 145 mmol/L 137 140 139  Potassium 3.5 - 5.1 mmol/L 4.0 4.0 4.3  Chloride 98 - 111 mmol/L 106 103 102  CO2 22 - 32 mmol/L 24 26 29   Calcium 8.9 - 10.3 mg/dL 8.6(L) 9.4 9.1  Total Protein 6.5 - 8.1 g/dL 6.5 7.7 6.8  Total Bilirubin 0.3 - 1.2 mg/dL 0.4 0.3 0.4  Alkaline Phos 38 - 126 U/L 76 83 80  AST 15 - 41 U/L 22 15 21   ALT 0 - 44 U/L 19 14 15   zzz CBC Latest Ref Rng & Units 05/02/2018 05/01/2018 04/25/2018  WBC 4.0 - 10.5 K/uL 15.5(H) - 10.6(H)  Hemoglobin 12.0 - 15.0 g/dL 10.4(L) 10.7(L) 11.2(L)  Hematocrit 36.0 - 46.0 % 35.3(L) 35.6(L) 37.2  Platelets 150 - 400 K/uL 216 - 227   Lipid Panel  No results found for: CHOL, TRIG, HDL, CHOLHDL, VLDL, LDLCALC, LDLDIRECT HEMOGLOBIN A1C Lab Results  Component Value Date   HGBA1C 6.7 (H) 04/25/2018   MPG 145.59 04/25/2018   TSH No results for input(s): TSH in the last 8760 hours.  Cardiac studies:   US Venous Img Lower Unilateral Left 03/13/2015:  No evidence of left lower extremity  DVT. 55 Sleep Study 2016: Uses nocturnal O2. Negative sleep study for apnea. On Home O2. Did not tolerate CPAP  Sleep study 2011: Dx'd with sleep apnea-does not use a CPAP. On Nocturnal O2 therapy.  Coronary angiogram 04/09/2013: Normal coronary arteries, normal left ventricle systolic function. Markedly elevated LVEDP of 31 mmHg.  Echo 02/17/11: No apical views. Grossly preserved LVEF. Moderate LVH, moderate left atrial enlargement.  Lexiscan stress 02/25/11: Mild gut uptake artifact. Normal perfusion. Normal LVEF. Low risk stress   Assessment:    Chronic diastolic CHF (congestive heart failure) (HCC) -  Essential hypertension - Plan: amLODipine (NORVASC) 5 MG tablet,  CKD (chronic kidney disease) stage 3, GFR 30-59 ml/min (HCC) -  Class 3 severe obesity due to excess calories with serious comorbidity and body mass index (BMI) of 50.0 to 59.9 in adult [facility]) -  EKG 11/10/2016: Normal sinus rhythm at rate of 84 bpm, borderline criteria for left atrial enlargement, no evidence of ischemia. Normal EKG.  Recommendations:    Recommendation:   She underwent gastric bypass on 05/01/2018. I BEEN SEEING HER FOR FREQUENTLY TO IMPROVE COMPLIANCE WITH WEIGHT LOSS, today she has lost about 30 pounds in weight over the past 3 months, about 18 of it has come in the past month.   Blood pressure is elevated today, will add amlodipine 5 mg daily.  She doesn't stage III chronic kidney disease and presently on 10 mg of ACE inhibitor and would not want to increase this any further.  With regard to coronary artery disease she has not had any angina pectoris, underwent gastric bypass surgery without cardiac complications.  Hence I reassured her.  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 She has no PND or orthopnea, dyspnea has remained stable.  I would like to see  her back in 3 months.  Patient is willing and  will be screened for DELIVER trial (Dapagliflozin vs Placibo in patients with chronic diastolic HF).  Adrian Prows, MD, [facility] 08/10/2018, 12:25 PM [facility]. [place] Pager: [phone] Office: [phone] If no answer Cell [phone]

## 2018-08-11 ENCOUNTER — Encounter: Payer: Self-pay | Admitting: Cardiology

## 2018-08-14 DIAGNOSIS — Z0000 Encounter for general adult medical examination without abnormal findings: Secondary | ICD-10-CM | POA: Diagnosis not present

## 2018-08-15 DIAGNOSIS — J9601 Acute respiratory failure with hypoxia: Secondary | ICD-10-CM | POA: Diagnosis not present

## 2018-08-15 DIAGNOSIS — J45901 Unspecified asthma with (acute) exacerbation: Secondary | ICD-10-CM | POA: Diagnosis not present

## 2018-08-22 DIAGNOSIS — E1165 Type 2 diabetes mellitus with hyperglycemia: Secondary | ICD-10-CM | POA: Diagnosis not present

## 2018-08-28 ENCOUNTER — Other Ambulatory Visit: Payer: Self-pay

## 2018-08-28 ENCOUNTER — Encounter: Payer: Medicare Other | Attending: General Surgery | Admitting: Skilled Nursing Facility1

## 2018-08-28 ENCOUNTER — Ambulatory Visit: Payer: Medicare Other | Admitting: Skilled Nursing Facility1

## 2018-08-28 DIAGNOSIS — E669 Obesity, unspecified: Secondary | ICD-10-CM | POA: Diagnosis present

## 2018-08-28 DIAGNOSIS — Z794 Long term (current) use of insulin: Secondary | ICD-10-CM | POA: Insufficient documentation

## 2018-08-28 DIAGNOSIS — N183 Chronic kidney disease, stage 3 unspecified: Secondary | ICD-10-CM

## 2018-08-28 DIAGNOSIS — I509 Heart failure, unspecified: Secondary | ICD-10-CM | POA: Insufficient documentation

## 2018-08-28 DIAGNOSIS — Z7982 Long term (current) use of aspirin: Secondary | ICD-10-CM | POA: Insufficient documentation

## 2018-08-28 DIAGNOSIS — Z79899 Other long term (current) drug therapy: Secondary | ICD-10-CM | POA: Diagnosis not present

## 2018-08-28 DIAGNOSIS — J4551 Severe persistent asthma with (acute) exacerbation: Secondary | ICD-10-CM | POA: Diagnosis not present

## 2018-08-28 DIAGNOSIS — E785 Hyperlipidemia, unspecified: Secondary | ICD-10-CM | POA: Diagnosis not present

## 2018-08-28 DIAGNOSIS — I110 Hypertensive heart disease with heart failure: Secondary | ICD-10-CM | POA: Diagnosis not present

## 2018-08-28 DIAGNOSIS — Z9884 Bariatric surgery status: Secondary | ICD-10-CM | POA: Insufficient documentation

## 2018-08-28 DIAGNOSIS — E119 Type 2 diabetes mellitus without complications: Secondary | ICD-10-CM | POA: Insufficient documentation

## 2018-08-28 NOTE — Progress Notes (Signed)
Bariatric Follow-Up Visit 2 Months Medical Nutrition Therapy  Appt Start Time: 9:45 End Time: 10:15 Primary Concerns Today: Bariatric Surgery Nutrition Follow Up   Bariatric Surgery Type: RYGB    Surgery Date: 05/01/2018   NUTRITION ASSESSMENT   Anthropometrics  Start weight at [facility]: 351.1  lbs  Today's weight: 294.1 Weight change:  11.8 lbs (since previous nutrition appointment)     Psychosocial/Lifestyle  Pt states she has measuring her foods using actual measuring cups. Pt does live with her mother and kids she is watching so that forms how she eats. Pt has been avoiding high sugar options and stopping from buying high sugar foods. Pt states she does eat rice sometimes eating protein and non starchy veggies along with it and measuring out the rice. Pt sates she is recognizing if she wants more she can have more 3 hours later.  Pt is currently over consuming sugar free options but she is not in a place to change that behavior without under consuming fluid in general.   Pt does have a hx of CKD stage 3 and DM.   Pt states her knee has been causing her pain requiring a cane for todays appt. Pt states she ate chicken which caused her to vomit stating it was too dry this was also fried: dietitian educated the pt on why she need not eat fried foods. Pt does eat foods outside of the current phase such as mashed potatoes, rice, and oatmeal and has been since shortly after surgery. Pt state she will stop eating when she feels weird and is getting better about not feeling she is wasting it. Pt states she thinks she is getting at not snacking no often during the day. Pt states she has recognized she has fried zucchini with a crust on it and has tried it in the air fryer. Pt states she tries not to weigh herself every day so she does not get upset.  Pt states she no longer drinks diet soda but does drink half and half tea.  Pt states she will be starting an insulin pump and has started amlodipine.  Pt  states she bought a basketball to shoot some hoops.  After further discussion her vegetables are actually high fat casseroles    Body Composition Scale 08/28/2018  Total Body Fat % 51.3  Visceral Fat 25  Fat-Free Mass % 48.6   Total Body Water % 38.8   Muscle-Mass lbs 28.6  Body Fat Displacement          Torso  lbs 93.7         Left Leg  lbs 18.7         Right Leg  lbs 18.7         Left Arm  lbs 9.3         Right Arm   lbs 9.3    Medications: Pt states she will be starting an insulin pump and has started amlodipine.   Labs: Blood sugars: 120 after breakfast about 1 hour   Supplements:  procare multi and calcium and vitamin D3  24-Hr Dietary Recall: eating out multiple times a week First Meal: 1 pack grits or 1 pack oatmeal with [place] sausage  Snack: cheese  Second Meal: sandwich  Third Meal: chicken pot pie just the filling or pork and beans And then [place] ice or cheese or fruit Snack: 2 sugar free popcicles and 1 sugar free jello  Beverages: water with sugar free packs and half and half  tea   Estimated Daily Fluid Intake: 4 17 oz bottles total 68 ounces  Estimated Daily Protein Intake:  60 g   Physical Activity  Current average weekly physical activity: app on her phone or walking    Signs/Symptoms  Using straws: no Drinking while eating: no Chewing/swallowing difficulties: no Changes in vision: no Changes to mood/headaches: no Hair loss/changes to skin/nails: no Difficulty focusing/concentrating: no Sweating: no Dizziness/lightheadedness: no Palpitations: no Carbonated/caffeinated beverages: no N/V/D/C/Gas: no Abdominal pain: no Dumping syndrome: no     NUTRITION DIAGNOSIS  Overweight/obesity ([place]-3.3) related to past poor dietary habits and physical inactivity as evidenced by patient w/ completed Bariatric surgery following dietary guidelines for continued weight loss and healthy nutrition status.     NUTRITION INTERVENTION Nutrition counseling  (C-1) and education (E-2) to facilitate bariatric surgery goals, including:  The importance of consuming adequate calories as well as certain nutrients daily due to the body's need for essential vitamins, minerals, and fats  The importance of daily physical activity and to reach a goal of at least 150 minutes of moderate to vigorous physical activity weekly (or as directed by their physician) due to benefits such as increased musculature and improved lab values  Pt Chosen Goals: -Do not eat fried food such as fried chicken -When you finish your procare chewable you can switch to capsule; have food on your stomach before you take them -If you eat the other half of your lunch that is your snack do not have another one; so do not eat again until dinner -[place] ice= 5 grams of sugar or less -Do not eat in between 3 hours  -Use fat free plain greek yogurt instead of sour cream -do not eat oysters since the only way you eat them is with a crust  -Have 2 of your bottles of water be plain with no added packets  -Eat non starchy vegetables 2 times a day, 7 days a week -Do not eat casseroles eat the vegetables not the casserole   Handouts Provided Include  myplate and meal ideas with servings   Readiness for Change: ready  Demonstrated degree of understanding via: Teach Back     MONITORING & EVALUATION Dietary intake, weekly physical activity, and body weight follow up in 2 months

## 2018-08-28 NOTE — Patient Instructions (Addendum)
-  Do not eat fried food such as fried chicken  -When you finish your procare chewable you can switch to capsule; have food on your stomach before you take them  -If you eat the other half of your lunch that is your snack do not have another one; so do not eat again until dinner  -[place] ice= 5 grams of sugar or less  -Do not eat in between 3 hours   -Use fat free plain greek yogurt instead of sour cream  -do not eat oysters since the only way you eat them is with a crust   -Have 2 of your bottles of water be plain with no added packets   -Eat non starchy vegetables 2 times a day, 7 days a week  -Do not eat casseroles eat the vegetables not the casserole

## 2018-09-04 DIAGNOSIS — M79672 Pain in left foot: Secondary | ICD-10-CM | POA: Diagnosis not present

## 2018-09-04 DIAGNOSIS — M79671 Pain in right foot: Secondary | ICD-10-CM | POA: Diagnosis not present

## 2018-09-14 DIAGNOSIS — J9601 Acute respiratory failure with hypoxia: Secondary | ICD-10-CM | POA: Diagnosis not present

## 2018-09-14 DIAGNOSIS — J45901 Unspecified asthma with (acute) exacerbation: Secondary | ICD-10-CM | POA: Diagnosis not present

## 2018-10-02 DIAGNOSIS — H35372 Puckering of macula, left eye: Secondary | ICD-10-CM | POA: Diagnosis not present

## 2018-10-02 DIAGNOSIS — E103593 Type 1 diabetes mellitus with proliferative diabetic retinopathy without macular edema, bilateral: Secondary | ICD-10-CM | POA: Diagnosis not present

## 2018-10-02 DIAGNOSIS — H3582 Retinal ischemia: Secondary | ICD-10-CM | POA: Diagnosis not present

## 2018-10-09 ENCOUNTER — Other Ambulatory Visit: Payer: Self-pay

## 2018-10-09 ENCOUNTER — Encounter: Payer: Medicare Other | Attending: General Surgery | Admitting: Skilled Nursing Facility1

## 2018-10-09 DIAGNOSIS — E669 Obesity, unspecified: Secondary | ICD-10-CM | POA: Insufficient documentation

## 2018-10-09 DIAGNOSIS — Z7982 Long term (current) use of aspirin: Secondary | ICD-10-CM | POA: Insufficient documentation

## 2018-10-09 DIAGNOSIS — E785 Hyperlipidemia, unspecified: Secondary | ICD-10-CM | POA: Diagnosis not present

## 2018-10-09 DIAGNOSIS — Z9884 Bariatric surgery status: Secondary | ICD-10-CM | POA: Diagnosis not present

## 2018-10-09 DIAGNOSIS — E119 Type 2 diabetes mellitus without complications: Secondary | ICD-10-CM | POA: Insufficient documentation

## 2018-10-09 DIAGNOSIS — Z794 Long term (current) use of insulin: Secondary | ICD-10-CM | POA: Diagnosis not present

## 2018-10-09 DIAGNOSIS — J4551 Severe persistent asthma with (acute) exacerbation: Secondary | ICD-10-CM | POA: Insufficient documentation

## 2018-10-09 DIAGNOSIS — I509 Heart failure, unspecified: Secondary | ICD-10-CM | POA: Insufficient documentation

## 2018-10-09 DIAGNOSIS — Z79899 Other long term (current) drug therapy: Secondary | ICD-10-CM | POA: Diagnosis not present

## 2018-10-09 DIAGNOSIS — N183 Chronic kidney disease, stage 3 unspecified: Secondary | ICD-10-CM

## 2018-10-09 DIAGNOSIS — I110 Hypertensive heart disease with heart failure: Secondary | ICD-10-CM | POA: Diagnosis not present

## 2018-10-09 NOTE — Progress Notes (Signed)
Bariatric Follow-Up Visit 2 Months Medical Nutrition Therapy  Appt Start Time: 9:45 End Time: 10:15 Primary Concerns Today: Bariatric Surgery Nutrition Follow Up   Bariatric Surgery Type: RYGB    Surgery Date: 05/01/2018   NUTRITION ASSESSMENT   Anthropometrics  Start weight at [facility]: 351.1  lbs  Today's weight: 290 Weight change:  4 lbs (since previous nutrition appointment)     Psychosocial/Lifestyle  Pt states she has measuring her foods using actual measuring cups. Pt does live with her mother and kids she is watching so that forms how she eats. Pt has been avoiding high sugar options and stopping from buying high sugar foods. Pt states she does eat rice sometimes eating protein and non starchy veggies along with it and measuring out the rice. Pt sates she is recognizing if she wants more she can have more 3 hours later.  Pt is currently over consuming sugar free options but she is not in a place to change that behavior without under consuming fluid in general.   Pt does have a hx of CKD stage 3 and DM.    Pt states she has been feeling dizzy having started last Friday August 7th. Pt states for 3 days she was having dizziness and nausea. Since that time she started taking her amlodipine at lunch time and fluid pill 1 3 times a day taken throughout the day. Pt states she has had no issues since Sunday.   Pt did start on an insulin pump; blood sugars 79-120. Pt state she is waiting for strips for her pump so for now manually typing in how much insulin she needs.     Body Composition Scale 08/28/2018 10/09/2018  Total Body Fat % 51.3 51.1  Visceral Fat 25 25  Fat-Free Mass % 48.6 48.8   Total Body Water % 38.8 38.9   Muscle-Mass lbs 28.6 28.6  Body Fat Displacement           Torso  lbs 93.7 91.9         Left Leg  lbs 18.7 18.3         Right Leg  lbs 18.7 18.3         Left Arm  lbs 9.3 9.1         Right Arm   lbs 9.3 9.1    Medications: Pt states she will be starting an  insulin pump and has started amlodipine.   Labs: Blood sugars: 120 after breakfast about 1 hour   Supplements:  procare multi and calcium and vitamin D3  24-Hr Dietary Recall: eating out multiple times a week First Meal: 1 pack grits with bacon bits and cheese or 1 pack oatmeal with [place] sausage  Snack: cheesestick and slim jims Second Meal: 1 can of tuna with mayo and eggs and relish and 4 crackers or sandwich Snack: box of raisins or 5 cherries  Third Meal: chicken pot pie just the filling or pork and beans or broiled ham potato salad: mayo and mustard and relish and peppers and pimento (1 cup) AND 1 cup of green beans made with olive oil  AND 1 cup of squash made with butter Snack: other half of dinner Beverages: water with sugar free packs and half and half tea, gingerale   Estimated Daily Fluid Intake: 72 oz bottles total 68 ounces  Estimated Daily Protein Intake:  60+ g   Physical Activity  Current average weekly physical activity: ADL's   Signs/Symptoms  Using straws: no Drinking while eating:  no Chewing/swallowing difficulties: no Changes in vision: no Changes to mood/headaches: no Hair loss/changes to skin/nails: no Difficulty focusing/concentrating: no Sweating: no Dizziness/lightheadedness: no Palpitations: no Carbonated/caffeinated beverages: no N/V/D/C/Gas: no Abdominal pain: no Dumping syndrome: no     NUTRITION DIAGNOSIS  Overweight/obesity ([place]-3.3) related to past poor dietary habits and physical inactivity as evidenced by patient w/ completed Bariatric surgery following dietary guidelines for continued weight loss and healthy nutrition status.     NUTRITION INTERVENTION Nutrition counseling (C-1) and education (E-2) to facilitate bariatric surgery goals, including:  The importance of consuming adequate calories as well as certain nutrients daily due to the body's need for essential vitamins, minerals, and fats  The importance of daily physical  activity and to reach a goal of at least 150 minutes of moderate to vigorous physical activity weekly (or as directed by their physician) due to benefits such as increased musculature and improved lab values  Pt Chosen Goals: -if you have dizziness again call your doctor -Start taking your blood pressure at home and writing those numbers down; at least once a day different times of day throughout the week -If you are feeling dizzy and nausea check your blood pressure -Have non starchy vegetables 2 times a day 7 days a week -Do a tortilla or wrap instead of bread -Aiming to do these things 7 days a week -Do not eat fried food or breading on your vegetables  -Continue to not use casseroles as your vegetables -Walk in the morning with your neighbor 4 times a week   Handouts Provided Include  myplate and meal ideas with servings   Readiness for Change: ready  Demonstrated degree of understanding via: Teach Back     MONITORING & EVALUATION Dietary intake, weekly physical activity, and body weight follow up in 2 months

## 2018-10-09 NOTE — Patient Instructions (Addendum)
-  if you have dizziness again call your doctor  -Start taking your blood pressure at home and writing those numbers down; at least once a day different times of day throughout the week  -If you are feeling dizzy and nausea check your blood pressure  -Have non starchy vegetables 2 times a day 7 days a week  -Do a tortilla or wrap instead of bread  -Aiming to do these things 7 days a week  -Do not eat fried food or breading on your vegetables   -Continue to not use casseroles as your vegetables   -Cut your starch down to 1/4 cup (poatato, corn, peas, rice) at dinner  -Walk in the morning with your neighbor 4 times a week

## 2018-10-15 DIAGNOSIS — J45901 Unspecified asthma with (acute) exacerbation: Secondary | ICD-10-CM | POA: Diagnosis not present

## 2018-10-15 DIAGNOSIS — J9601 Acute respiratory failure with hypoxia: Secondary | ICD-10-CM | POA: Diagnosis not present

## 2018-10-22 DIAGNOSIS — E1169 Type 2 diabetes mellitus with other specified complication: Secondary | ICD-10-CM | POA: Diagnosis not present

## 2018-10-22 DIAGNOSIS — E1165 Type 2 diabetes mellitus with hyperglycemia: Secondary | ICD-10-CM | POA: Diagnosis not present

## 2018-10-31 DIAGNOSIS — I10 Essential (primary) hypertension: Secondary | ICD-10-CM | POA: Diagnosis not present

## 2018-10-31 DIAGNOSIS — Z79899 Other long term (current) drug therapy: Secondary | ICD-10-CM | POA: Diagnosis not present

## 2018-10-31 DIAGNOSIS — R809 Proteinuria, unspecified: Secondary | ICD-10-CM | POA: Diagnosis not present

## 2018-10-31 DIAGNOSIS — E559 Vitamin D deficiency, unspecified: Secondary | ICD-10-CM | POA: Diagnosis not present

## 2018-10-31 DIAGNOSIS — D649 Anemia, unspecified: Secondary | ICD-10-CM | POA: Diagnosis not present

## 2018-10-31 DIAGNOSIS — N183 Chronic kidney disease, stage 3 (moderate): Secondary | ICD-10-CM | POA: Diagnosis not present

## 2018-11-02 DIAGNOSIS — E1165 Type 2 diabetes mellitus with hyperglycemia: Secondary | ICD-10-CM | POA: Diagnosis not present

## 2018-11-02 DIAGNOSIS — E1169 Type 2 diabetes mellitus with other specified complication: Secondary | ICD-10-CM | POA: Diagnosis not present

## 2018-11-06 DIAGNOSIS — E039 Hypothyroidism, unspecified: Secondary | ICD-10-CM | POA: Diagnosis not present

## 2018-11-06 DIAGNOSIS — I10 Essential (primary) hypertension: Secondary | ICD-10-CM | POA: Diagnosis not present

## 2018-11-06 DIAGNOSIS — E1022 Type 1 diabetes mellitus with diabetic chronic kidney disease: Secondary | ICD-10-CM | POA: Diagnosis not present

## 2018-11-06 DIAGNOSIS — N183 Chronic kidney disease, stage 3 (moderate): Secondary | ICD-10-CM | POA: Diagnosis not present

## 2018-11-06 DIAGNOSIS — Z794 Long term (current) use of insulin: Secondary | ICD-10-CM | POA: Diagnosis not present

## 2018-11-12 ENCOUNTER — Encounter: Payer: Self-pay | Admitting: Cardiology

## 2018-11-12 ENCOUNTER — Other Ambulatory Visit: Payer: Self-pay

## 2018-11-12 ENCOUNTER — Ambulatory Visit (INDEPENDENT_AMBULATORY_CARE_PROVIDER_SITE_OTHER): Payer: Medicare Other | Admitting: Cardiology

## 2018-11-12 VITALS — BP 90/60 | HR 65 | Ht 60.0 in | Wt 280.6 lb

## 2018-11-12 DIAGNOSIS — I10 Essential (primary) hypertension: Secondary | ICD-10-CM | POA: Diagnosis not present

## 2018-11-12 DIAGNOSIS — Z6841 Body Mass Index (BMI) 40.0 and over, adult: Secondary | ICD-10-CM | POA: Diagnosis not present

## 2018-11-12 DIAGNOSIS — I5032 Chronic diastolic (congestive) heart failure: Secondary | ICD-10-CM

## 2018-11-12 NOTE — Progress Notes (Signed)
Primary Physician/Referring:  Iona Beard, MD  Patient ID: [name], female    DOB: 22-Jan-1966, 53 y.o.   MRN: [id number]  Chief Complaint  Patient presents with  . Congestive Heart Failure    3 month f/u    HPI: [name]  is a 53 y.o. 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 She has normal coronary arteries by angiography in 2015.  She presents for follow-up of diastolic heart failure and hypertension.  She has become more compliant with diet, She has lost about 5 pounds in weight since last office visit 3 months ago.  Dyspnea is remained stable, no change in her leg edema.  On her last office visit had added amlodipine for blood pressure control which she is tolerating without any side effects.  Past Medical History:  Diagnosis Date  . Acute asthma exacerbation 12/21/2014  . Acute renal failure ([place])   . ARF (acute renal failure) ([place]) 05/03/2014  . Asthma   . Asthma exacerbation 05/03/2014  . Asthma, severe persistent 05/03/2014  . Chronic diastolic CHF (congestive heart failure) ([place]) 06/21/2018  . Diabetes mellitus   . Diabetes mellitus type 2 in obese ([place]) 05/03/2014  . History of echocardiogram 08/348   LVH, diastolic dysfunction  . Hyperlipidemia   . Hypertension   . Hyponatremia 12/02/2014  . Obesity   . Preop examination 05/18/2017  . SOB (shortness of breath) 12/22/2014    Past Surgical History:  Procedure Laterality Date  . CARDIAC CATHETERIZATION  03/2013   normal coronary arteries, EF 55%  . CARDIAC CATHETERIZATION  03/2013   normal coronary arteries  . CATARACT EXTRACTION W/PHACO Right 12/10/2012   Procedure: CATARACT EXTRACTION PHACO AND INTRAOCULAR LENS PLACEMENT ([place]);  Surgeon: Tonny Branch, MD;  Location: [facility];   Service: Ophthalmology;  Laterality: Right;  CDE:22..42  . CATARACT EXTRACTION W/PHACO Left 05/11/2015   Procedure: CATARACT EXTRACTION PHACO AND INTRAOCULAR LENS PLACEMENT LEFT EYE CDE=6.54;  Surgeon: Tonny Branch, MD;  Location: [facility];  Service: Ophthalmology;  Laterality: Left;  . COLONOSCOPY N/A 09/22/2017   Procedure: COLONOSCOPY;  Surgeon: Danie Binder, MD;  Location: [facility]ITE;  Service: Endoscopy;  Laterality: N/A;  12:15  . EYE SURGERY Left   . GASTRIC BYPASS  05/01/2018  . GASTRIC ROUX-EN-Y N/A 05/01/2018   Procedure: LAPAROSCOPIC ROUX-EN-Y GASTRIC BYPASS WITH UPPER ENDOSCOPY WITH ERAS PATHWAY;  Surgeon: Kinsinger, Arta Bruce, MD;  Location: [facility];  Service: General;  Laterality: N/A;  . LEFT HEART CATHETERIZATION WITH CORONARY ANGIOGRAM N/A 04/09/2013   Procedure: LEFT HEART CATHETERIZATION WITH CORONARY ANGIOGRAM;  Surgeon: Laverda Page, MD;  Location: [facility] CATH LAB;  Service: Cardiovascular;  Laterality: N/A;  . TRACHEOSTOMY     at age 74 from asthma attack    Social History   Socioeconomic History  . Marital status: Single    Spouse name: Not on file  . Number of children: 0  . Years of education: Not on file  . Highest education level: Not on file  Occupational History  . Not on file  Social Needs  . Financial resource strain: Not on file  . Food insecurity    Worry: Never true    Inability: Never true  . Transportation needs    Medical: Not on file    Non-medical: Not on file  Tobacco Use  . Smoking status: Never Smoker  . Smokeless tobacco: Never Used  Substance and Sexual Activity  . Alcohol use: Not Currently  . Drug use: No  . Sexual activity: Yes    Birth control/protection: None  Lifestyle  . Physical activity    Days per week: Not on file    Minutes per session: Not on file  . Stress: Not on file  Relationships  . Social Herbalist on phone: Not on file    Gets together: Not on file    Attends religious service: Not on file     Active member of club or organization: Not on file    Attends meetings of clubs or organizations: Not on file    Relationship status: Not on file  . Intimate partner violence    Fear of current or ex partner: Not on file    Emotionally abused: Not on file    Physically abused: Not on file    Forced sexual activity: Not on file  Other Topics Concern  . Not on file  Social History Narrative  . Not on file    Current Outpatient Medications on File Prior to Visit  Medication Sig Dispense Refill  . acetaminophen (TYLENOL) 500 MG tablet Take 500 mg by mouth every 6 (six) hours as needed for moderate pain.     Marland Kitchen albuterol (PROVENTIL HFA;VENTOLIN HFA) 108 (90 BASE) MCG/ACT inhaler Inhale 2 puffs into the lungs every 6 (six) hours as needed for wheezing.    Marland Kitchen albuterol (PROVENTIL) (2.5 MG/3ML) 0.083% nebulizer solution Take 3 mLs (2.5 mg total) by nebulization every 6 (six) hours as needed for wheezing or shortness of breath. 75 mL 12  . amLODipine (NORVASC) 5 MG tablet Take 1 tablet (5 mg total) by mouth daily. 90 tablet 1  . atorvastatin (LIPITOR) 20 MG tablet Take 20 mg by mouth daily.     . Calcium Carbonate-Vitamin D (CALCIUM 600+D PO) Take 1 tablet by mouth daily.    . carboxymethylcellulose (REFRESH PLUS) 0.5 % SOLN Place 1 drop into both eyes 3 (three) times daily as needed (dry eyes).    . carvedilol (COREG) 6.25 MG tablet Take 6.25 mg by mouth 2 (two) times daily.     . Cholecalciferol (VITAMIN D) 2000 units tablet Take 2,000 Units by mouth daily.     . Fluticasone-Salmeterol (ADVAIR) 100-50 MCG/DOSE AEPB Inhale 1 puff into the lungs 2 (two) times daily.     Marland Kitchen gabapentin (NEURONTIN) 300 MG capsule Take 300 mg by mouth 4 (four) times daily.     . Insulin Human (INSULIN PUMP) SOLN Inject into the skin.    Marland Kitchen levothyroxine (SYNTHROID, LEVOTHROID) 75 MCG tablet Take 75 mcg by mouth daily before breakfast.    . lisinopril (ZESTRIL) 10 MG tablet Take 10 mg by mouth daily.     . montelukast  (SINGULAIR) 10 MG tablet Take 10 mg by mouth daily.     . Multiple Vitamins-Minerals (MULTIVITAMIN WITH MINERALS) tablet Take 1 tablet by mouth daily.    . potassium chloride SA (K-DUR,KLOR-CON) 20 MEQ tablet Take 1 tablet (20 mEq total) by mouth daily as needed (With furosemide). 30 tablet 3  . torsemide (DEMADEX) 20 MG tablet Take 20 mg by mouth 2 (two) times a day. Two tablets in morning and one in the evening    . OXYGEN Inhale 8 L into the lungs daily as needed. Only at bedtime as needed    . pantoprazole (  PROTONIX) 40 MG tablet Take 1 tablet (40 mg total) by mouth daily. 90 tablet 0   No current facility-administered medications on file prior to visit.    Review of Systems  Constitution: Negative for chills, decreased appetite, malaise/fatigue and weight gain.  Cardiovascular: Positive for dyspnea on exertion (stable) and leg swelling. Negative for syncope.  Endocrine: Negative for cold intolerance.  Hematologic/Lymphatic: Does not bruise/bleed easily.  Musculoskeletal: Negative for joint swelling.  Gastrointestinal: Negative for abdominal pain, anorexia and change in bowel habit.  Neurological: Negative for headaches and light-headedness.  Psychiatric/Behavioral: Negative for depression and substance abuse.  All other systems reviewed and are negative.     Objective:  Blood pressure (!) 162/65, pulse 65, height 5' (1.524 m), weight 280 lb 9.6 oz (127.3 kg), last menstrual period 09/11/2013, SpO2 98 %. Body mass index is 54.8 kg/m.  Physical Exam  Constitutional: She appears well-developed. No distress.  Morbidly obese  HENT:  Head: Atraumatic.  Eyes: Conjunctivae are normal.  Neck: Neck supple. No thyromegaly present.  Short neck and difficult to evaluate JVP  Cardiovascular: Normal rate, regular rhythm and normal heart sounds. Exam reveals no gallop.  No murmur heard. Pulses:      Carotid pulses are 2+ on the right side and 2+ on the left side.      Dorsalis pedis pulses  are 2+ on the right side and 2+ on the left side.       Posterior tibial pulses are 2+ on the right side and 2+ on the left side.  Femoral and popliteal pulse difficult to feel due to patient's body habitus. Chronic venostasis changes noted in the lower extremity along with 2+ pitting edema.  Skin has the appearance of a Peu-de-orange. 1-2 + pitting edema also present.  Pulmonary/Chest: Effort normal and breath sounds normal.  Abdominal: Soft. Bowel sounds are normal.  Obese. Pannus present  Musculoskeletal: Normal range of motion.  Neurological: She is alert.  Skin: Skin is warm and dry.  Psychiatric: She has a normal mood and affect.   Radiology: No results found. Laboratory Examination:   CMP Latest Ref Rng & Units 05/02/2018 04/25/2018 03/24/2015  Glucose 70 - 99 mg/dL 203(H) 141(H) 180(H)  BUN 6 - 20 mg/dL 63(H) 71(H) 39(H)  Creatinine 0.44 - 1.00 mg/dL 1.58(H) 1.79(H) 1.59(H)  Sodium 135 - 145 mmol/L 137 140 139  Potassium 3.5 - 5.1 mmol/L 4.0 4.0 4.3  Chloride 98 - 111 mmol/L 106 103 102  CO2 22 - 32 mmol/L 24 26 29   Calcium 8.9 - 10.3 mg/dL 8.6(L) 9.4 9.1  Total Protein 6.5 - 8.1 g/dL 6.5 7.7 6.8  Total Bilirubin 0.3 - 1.2 mg/dL 0.4 0.3 0.4  Alkaline Phos 38 - 126 U/L 76 83 80  AST 15 - 41 U/L 22 15 21   ALT 0 - 44 U/L 19 14 15   zzz CBC Latest Ref Rng & Units 05/02/2018 05/01/2018 04/25/2018  WBC 4.0 - 10.5 K/uL 15.5(H) - 10.6(H)  Hemoglobin 12.0 - 15.0 g/dL 10.4(L) 10.7(L) 11.2(L)  Hematocrit 36.0 - 46.0 % 35.3(L) 35.6(L) 37.2  Platelets 150 - 400 K/uL 216 - 227   Lipid Panel  No results found for: CHOL, TRIG, HDL, CHOLHDL, VLDL, LDLCALC, LDLDIRECT HEMOGLOBIN A1C Lab Results  Component Value Date   HGBA1C 6.7 (H) 04/25/2018   MPG 145.59 04/25/2018   TSH No results for input(s): TSH in the last 8760 hours.  Cardiac studies:   US Venous Img Lower Unilateral Left 03/13/2015: No evidence of  left lower extremity DVT. 55 Sleep Study 2016: Uses nocturnal O2. Negative sleep  study for apnea. On Home O2. Did not tolerate CPAP  Sleep study 2011: Dx'd with sleep apnea-does not use a CPAP. On Nocturnal O2 therapy.  Coronary angiogram 04/09/2013: Normal coronary arteries, normal left ventricle systolic function. Markedly elevated LVEDP of 31 mmHg.  Echo 02/17/11: No apical views. Grossly preserved LVEF. Moderate LVH, moderate left atrial enlargement.  Lexiscan stress 02/25/11: Mild gut uptake artifact. Normal perfusion. Normal LVEF. Low risk stress   Assessment:      ICD-10-CM   1. Chronic diastolic CHF (congestive heart failure) (HCC)  I50.32 EKG 12-Lead  2. Essential hypertension  I10   3. Class 3 severe obesity due to excess calories with serious comorbidity and body mass index (BMI) of 50.0 to 59.9 in adult [facility])  E66.01    Z68.43     EKG 11/12/2018: Normal sinus rhythm at rate of 55 bpm, normal axis.  Poor R-wave progression, probably normal variant.  Low-voltage complexes. No significant change from  EKG 11/10/2016  Recommendations:    Recommendation:   She underwent gastric bypass on 05/01/2018. I BEEN SEEING HER FOR FREQUENTLY TO IMPROVE COMPLIANCE WITH WEIGHT LOSS, today she has lost about 5 pounds in weight over the past 3 months. Patient is here on a three-month office visit and follow-up of hypertension as well.  Her last office visit and added amlodipine, blood pressure is well controlled in fact it is fairly low but patient is completely asymptomatic.With continued weight loss she may need decreasing doses of insulin and also blood pressure medications.  For now continue the same, I'll see her back in 6 months. It is unfortunate that she has not had more significant weight loss and I have again discussed with her regarding making changes to her diet.  With regard to coronary artery disease she has not had any angina pectoris, EKG reveals normal sinus rhythm.  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 She has no PND or orthopnea, dyspnea has remained stable.   Adrian Prows, MD, [facility] 11/12/2018, 11:03 AM [facility] [place] Pager: [phone] Office: [phone] If no answer Cell [phone]

## 2018-11-13 ENCOUNTER — Encounter: Payer: Medicare Other | Attending: General Surgery | Admitting: Dietician

## 2018-11-13 ENCOUNTER — Other Ambulatory Visit: Payer: Self-pay

## 2018-11-13 ENCOUNTER — Encounter: Payer: Self-pay | Admitting: Dietician

## 2018-11-13 DIAGNOSIS — I110 Hypertensive heart disease with heart failure: Secondary | ICD-10-CM | POA: Diagnosis not present

## 2018-11-13 DIAGNOSIS — E669 Obesity, unspecified: Secondary | ICD-10-CM | POA: Diagnosis present

## 2018-11-13 DIAGNOSIS — I509 Heart failure, unspecified: Secondary | ICD-10-CM | POA: Diagnosis not present

## 2018-11-13 DIAGNOSIS — Z9884 Bariatric surgery status: Secondary | ICD-10-CM | POA: Diagnosis not present

## 2018-11-13 DIAGNOSIS — Z79899 Other long term (current) drug therapy: Secondary | ICD-10-CM | POA: Insufficient documentation

## 2018-11-13 DIAGNOSIS — E785 Hyperlipidemia, unspecified: Secondary | ICD-10-CM | POA: Diagnosis not present

## 2018-11-13 DIAGNOSIS — E119 Type 2 diabetes mellitus without complications: Secondary | ICD-10-CM | POA: Diagnosis not present

## 2018-11-13 DIAGNOSIS — J4551 Severe persistent asthma with (acute) exacerbation: Secondary | ICD-10-CM | POA: Diagnosis not present

## 2018-11-13 DIAGNOSIS — Z794 Long term (current) use of insulin: Secondary | ICD-10-CM | POA: Insufficient documentation

## 2018-11-13 DIAGNOSIS — Z7982 Long term (current) use of aspirin: Secondary | ICD-10-CM | POA: Diagnosis not present

## 2018-11-13 DIAGNOSIS — N183 Chronic kidney disease, stage 3 unspecified: Secondary | ICD-10-CM

## 2018-11-13 NOTE — Progress Notes (Signed)
Bariatric Follow-Up Visit 6 Months Medical Nutrition Therapy  Appt Start Time: 9:10am  End Time: 9:40am  Primary Concerns Today: Bariatric Surgery Nutrition Follow Up  Bariatric Surgery Type: RYGB     Surgery Date: 05/01/2018   NUTRITION ASSESSMENT   Anthropometrics  Start weight at [facility]: 351.1 lbs  Today's weight: 290 lbs Weight change:  -12 lbs (since previous nutrition appointment on 10/09/2018)  Body Composition Scale 08/28/2018 10/09/2018 11/13/2018  BMI   51.4  Total Body Fat % 51.3 51.1 49.6     Visceral Fat 25 25 22   Fat-Free Mass % 48.6 48.8 50.3     Total Body Water % 38.8 38.9 39.6     Muscle-Mass lbs 28.6 28.6 29.4  Body Fat Displacement            Torso  lbs 93.7 91.9 85.7         Left Leg  lbs 18.7 18.3 17.1         Right Leg  lbs 18.7 18.3 17.1         Left Arm  lbs 9.3 9.1 8.5         Right Arm   lbs 9.3 9.1 8.5    Pt states she has measuring her foods using actual measuring cups. Pt does live with her mother and kids she is watching so that forms how she eats. Pt has been avoiding high sugar options and stopping from buying high sugar foods. Pt states she does eat rice sometimes eating protein and non starchy veggies along with it and measuring out the rice. Pt sates she is recognizing if she wants more she can have more 3 hours later.  Pt is currently over consuming sugar free options but she is not in a place to change that behavior without under consuming fluid in general.   Pt does have a hx of CKD stage 3 and DM.   Supplements procare multi (*has not been taking), calcium and vitamin D3  24-Hr Dietary Recall: eating out multiple times a week First Meal: 1 pack grits with bacon bits and cheese (or 1 pack oatmeal with [place] sausage) Snack: cheesestick (or slim jim) Second Meal: 1 can of tuna with mayo and eggs and relish and 4 crackers or sandwich Snack: box of raisins (or 5 cherries)  Third Meal: chicken + green beans + rice  Snack: other half of  dinner Beverages: water with sugar free packs and half and half tea, gingerale   Estimated Daily Fluid Intake: 68 ounces  Estimated Daily Protein Intake:  50-60 g   Physical Activity  Current average weekly physical activity: ADL's   Signs/Symptoms  Using straws: no Drinking while eating: no Chewing/swallowing difficulties: no Changes in vision: no Changes to mood/headaches: no Hair loss/changes to skin/nails: no Difficulty focusing/concentrating: no Sweating: no Dizziness/lightheadedness: no Palpitations: no Carbonated/caffeinated beverages: no N/V/D/C/Gas: no Abdominal pain: no Dumping syndrome: no     NUTRITION DIAGNOSIS  Overweight/obesity ([place]-3.3) related to past poor dietary habits and physical inactivity as evidenced by patient w/ completed RYGB surgery following dietary guidelines for continued weight loss and healthy nutrition status.     NUTRITION INTERVENTION Nutrition counseling (C-1) and education (E-2) to facilitate bariatric surgery goals, including:  Advancement to diet phase V to include starchy vegetables  The importance of consuming adequate calories as well as certain nutrients daily due to the body's need for essential vitamins, minerals, and fats  The importance of daily physical activity and to reach a goal of  at least 150 minutes of moderate to vigorous physical activity weekly (or as directed by their physician) due to benefits such as increased musculature and improved lab values  Handouts Provided Include   Phase V: Protein + All Vegetables   Readiness for Change: Ready Demonstrated degree of understanding via: Teach Back     MONITORING & EVALUATION Dietary intake, weekly physical activity, and body weight follow up in 3 months.  Patient is to return to [facility] in 3 months for 9 Month Post-Op visit.

## 2018-11-14 ENCOUNTER — Other Ambulatory Visit: Payer: Self-pay | Admitting: Cardiology

## 2018-11-14 DIAGNOSIS — Z9884 Bariatric surgery status: Secondary | ICD-10-CM | POA: Diagnosis not present

## 2018-11-14 DIAGNOSIS — R69 Illness, unspecified: Secondary | ICD-10-CM | POA: Diagnosis not present

## 2018-11-14 DIAGNOSIS — K912 Postsurgical malabsorption, not elsewhere classified: Secondary | ICD-10-CM | POA: Diagnosis not present

## 2018-11-15 DIAGNOSIS — J9601 Acute respiratory failure with hypoxia: Secondary | ICD-10-CM | POA: Diagnosis not present

## 2018-11-15 DIAGNOSIS — J45901 Unspecified asthma with (acute) exacerbation: Secondary | ICD-10-CM | POA: Diagnosis not present

## 2018-11-21 DIAGNOSIS — N183 Chronic kidney disease, stage 3 (moderate): Secondary | ICD-10-CM | POA: Diagnosis not present

## 2018-11-21 DIAGNOSIS — I10 Essential (primary) hypertension: Secondary | ICD-10-CM | POA: Diagnosis not present

## 2018-11-21 DIAGNOSIS — Z79899 Other long term (current) drug therapy: Secondary | ICD-10-CM | POA: Diagnosis not present

## 2018-11-29 ENCOUNTER — Ambulatory Visit (HOSPITAL_COMMUNITY)
Admission: RE | Admit: 2018-11-29 | Discharge: 2018-11-29 | Disposition: A | Discharge status: Home / Self Care | Payer: Medicare Other | Source: Ambulatory Visit | Attending: Family Medicine | Admitting: Family Medicine

## 2018-11-29 ENCOUNTER — Other Ambulatory Visit (HOSPITAL_COMMUNITY): Payer: Self-pay | Admitting: Family Medicine

## 2018-11-29 ENCOUNTER — Other Ambulatory Visit: Payer: Self-pay

## 2018-11-29 ENCOUNTER — Encounter (HOSPITAL_COMMUNITY): Payer: Self-pay

## 2018-11-29 DIAGNOSIS — D631 Anemia in chronic kidney disease: Secondary | ICD-10-CM | POA: Diagnosis not present

## 2018-11-29 DIAGNOSIS — G8929 Other chronic pain: Secondary | ICD-10-CM

## 2018-11-29 DIAGNOSIS — R42 Dizziness and giddiness: Secondary | ICD-10-CM | POA: Diagnosis not present

## 2018-11-29 DIAGNOSIS — M25562 Pain in left knee: Secondary | ICD-10-CM

## 2018-11-29 DIAGNOSIS — M1711 Unilateral primary osteoarthritis, right knee: Secondary | ICD-10-CM | POA: Diagnosis not present

## 2018-11-29 DIAGNOSIS — R809 Proteinuria, unspecified: Secondary | ICD-10-CM | POA: Diagnosis not present

## 2018-11-29 DIAGNOSIS — M25561 Pain in right knee: Secondary | ICD-10-CM

## 2018-11-29 DIAGNOSIS — E211 Secondary hyperparathyroidism, not elsewhere classified: Secondary | ICD-10-CM | POA: Diagnosis not present

## 2018-11-29 DIAGNOSIS — Z1231 Encounter for screening mammogram for malignant neoplasm of breast: Secondary | ICD-10-CM

## 2018-11-29 DIAGNOSIS — N189 Chronic kidney disease, unspecified: Secondary | ICD-10-CM | POA: Diagnosis not present

## 2018-12-04 DIAGNOSIS — L110 Acquired keratosis follicularis: Secondary | ICD-10-CM | POA: Diagnosis not present

## 2018-12-04 DIAGNOSIS — E1140 Type 2 diabetes mellitus with diabetic neuropathy, unspecified: Secondary | ICD-10-CM | POA: Diagnosis not present

## 2018-12-04 DIAGNOSIS — L609 Nail disorder, unspecified: Secondary | ICD-10-CM | POA: Diagnosis not present

## 2018-12-06 ENCOUNTER — Ambulatory Visit (HOSPITAL_COMMUNITY)
Admission: RE | Admit: 2018-12-06 | Discharge: 2018-12-06 | Disposition: A | Discharge status: Home / Self Care | Payer: Medicare Other | Source: Ambulatory Visit | Attending: Family Medicine | Admitting: Family Medicine

## 2018-12-06 ENCOUNTER — Other Ambulatory Visit: Payer: Self-pay

## 2018-12-06 DIAGNOSIS — Z1231 Encounter for screening mammogram for malignant neoplasm of breast: Secondary | ICD-10-CM | POA: Diagnosis not present

## 2018-12-11 DIAGNOSIS — E875 Hyperkalemia: Secondary | ICD-10-CM | POA: Diagnosis not present

## 2018-12-11 DIAGNOSIS — N179 Acute kidney failure, unspecified: Secondary | ICD-10-CM | POA: Diagnosis not present

## 2018-12-11 DIAGNOSIS — D649 Anemia, unspecified: Secondary | ICD-10-CM | POA: Diagnosis not present

## 2018-12-11 DIAGNOSIS — R809 Proteinuria, unspecified: Secondary | ICD-10-CM | POA: Diagnosis not present

## 2018-12-12 DIAGNOSIS — E1169 Type 2 diabetes mellitus with other specified complication: Secondary | ICD-10-CM | POA: Diagnosis not present

## 2018-12-12 DIAGNOSIS — E1165 Type 2 diabetes mellitus with hyperglycemia: Secondary | ICD-10-CM | POA: Diagnosis not present

## 2018-12-13 DIAGNOSIS — R42 Dizziness and giddiness: Secondary | ICD-10-CM | POA: Diagnosis not present

## 2018-12-13 DIAGNOSIS — E875 Hyperkalemia: Secondary | ICD-10-CM | POA: Diagnosis not present

## 2018-12-13 DIAGNOSIS — N170 Acute kidney failure with tubular necrosis: Secondary | ICD-10-CM | POA: Diagnosis not present

## 2018-12-13 DIAGNOSIS — N189 Chronic kidney disease, unspecified: Secondary | ICD-10-CM | POA: Diagnosis not present

## 2018-12-13 DIAGNOSIS — R809 Proteinuria, unspecified: Secondary | ICD-10-CM | POA: Diagnosis not present

## 2018-12-15 DIAGNOSIS — J9601 Acute respiratory failure with hypoxia: Secondary | ICD-10-CM | POA: Diagnosis not present

## 2018-12-15 DIAGNOSIS — J45901 Unspecified asthma with (acute) exacerbation: Secondary | ICD-10-CM | POA: Diagnosis not present

## 2018-12-17 ENCOUNTER — Other Ambulatory Visit: Payer: Self-pay | Admitting: Cardiology

## 2018-12-17 DIAGNOSIS — I10 Essential (primary) hypertension: Secondary | ICD-10-CM

## 2019-01-01 DIAGNOSIS — H43821 Vitreomacular adhesion, right eye: Secondary | ICD-10-CM | POA: Diagnosis not present

## 2019-01-01 DIAGNOSIS — H3582 Retinal ischemia: Secondary | ICD-10-CM | POA: Diagnosis not present

## 2019-01-01 DIAGNOSIS — H35372 Puckering of macula, left eye: Secondary | ICD-10-CM | POA: Diagnosis not present

## 2019-01-01 DIAGNOSIS — E103593 Type 1 diabetes mellitus with proliferative diabetic retinopathy without macular edema, bilateral: Secondary | ICD-10-CM | POA: Diagnosis not present

## 2019-01-03 DIAGNOSIS — I10 Essential (primary) hypertension: Secondary | ICD-10-CM | POA: Diagnosis not present

## 2019-01-03 DIAGNOSIS — N183 Chronic kidney disease, stage 3 unspecified: Secondary | ICD-10-CM | POA: Diagnosis not present

## 2019-01-03 DIAGNOSIS — D649 Anemia, unspecified: Secondary | ICD-10-CM | POA: Diagnosis not present

## 2019-01-03 DIAGNOSIS — Z79899 Other long term (current) drug therapy: Secondary | ICD-10-CM | POA: Diagnosis not present

## 2019-01-03 DIAGNOSIS — E559 Vitamin D deficiency, unspecified: Secondary | ICD-10-CM | POA: Diagnosis not present

## 2019-01-03 DIAGNOSIS — R809 Proteinuria, unspecified: Secondary | ICD-10-CM | POA: Diagnosis not present

## 2019-01-10 DIAGNOSIS — I129 Hypertensive chronic kidney disease with stage 1 through stage 4 chronic kidney disease, or unspecified chronic kidney disease: Secondary | ICD-10-CM | POA: Diagnosis not present

## 2019-01-10 DIAGNOSIS — R809 Proteinuria, unspecified: Secondary | ICD-10-CM | POA: Diagnosis not present

## 2019-01-10 DIAGNOSIS — E875 Hyperkalemia: Secondary | ICD-10-CM | POA: Diagnosis not present

## 2019-01-10 DIAGNOSIS — N170 Acute kidney failure with tubular necrosis: Secondary | ICD-10-CM | POA: Diagnosis not present

## 2019-01-10 DIAGNOSIS — N189 Chronic kidney disease, unspecified: Secondary | ICD-10-CM | POA: Diagnosis not present

## 2019-01-14 DIAGNOSIS — E1169 Type 2 diabetes mellitus with other specified complication: Secondary | ICD-10-CM | POA: Diagnosis not present

## 2019-01-14 DIAGNOSIS — E1165 Type 2 diabetes mellitus with hyperglycemia: Secondary | ICD-10-CM | POA: Diagnosis not present

## 2019-01-15 DIAGNOSIS — J9601 Acute respiratory failure with hypoxia: Secondary | ICD-10-CM | POA: Diagnosis not present

## 2019-01-15 DIAGNOSIS — J45901 Unspecified asthma with (acute) exacerbation: Secondary | ICD-10-CM | POA: Diagnosis not present

## 2019-02-04 DIAGNOSIS — Z79899 Other long term (current) drug therapy: Secondary | ICD-10-CM | POA: Diagnosis not present

## 2019-02-04 DIAGNOSIS — D631 Anemia in chronic kidney disease: Secondary | ICD-10-CM | POA: Diagnosis not present

## 2019-02-04 DIAGNOSIS — N1832 Chronic kidney disease, stage 3b: Secondary | ICD-10-CM | POA: Diagnosis not present

## 2019-02-04 DIAGNOSIS — E559 Vitamin D deficiency, unspecified: Secondary | ICD-10-CM | POA: Diagnosis not present

## 2019-02-04 DIAGNOSIS — I10 Essential (primary) hypertension: Secondary | ICD-10-CM | POA: Diagnosis not present

## 2019-02-05 DIAGNOSIS — E1122 Type 2 diabetes mellitus with diabetic chronic kidney disease: Secondary | ICD-10-CM | POA: Diagnosis not present

## 2019-02-05 DIAGNOSIS — M79662 Pain in left lower leg: Secondary | ICD-10-CM | POA: Diagnosis not present

## 2019-02-05 DIAGNOSIS — I10 Essential (primary) hypertension: Secondary | ICD-10-CM | POA: Diagnosis not present

## 2019-02-05 DIAGNOSIS — N183 Chronic kidney disease, stage 3 unspecified: Secondary | ICD-10-CM | POA: Diagnosis not present

## 2019-02-08 DIAGNOSIS — N170 Acute kidney failure with tubular necrosis: Secondary | ICD-10-CM | POA: Diagnosis not present

## 2019-02-08 DIAGNOSIS — R809 Proteinuria, unspecified: Secondary | ICD-10-CM | POA: Diagnosis not present

## 2019-02-08 DIAGNOSIS — N189 Chronic kidney disease, unspecified: Secondary | ICD-10-CM | POA: Diagnosis not present

## 2019-02-08 DIAGNOSIS — E211 Secondary hyperparathyroidism, not elsewhere classified: Secondary | ICD-10-CM | POA: Diagnosis not present

## 2019-02-08 DIAGNOSIS — E875 Hyperkalemia: Secondary | ICD-10-CM | POA: Diagnosis not present

## 2019-02-12 ENCOUNTER — Other Ambulatory Visit: Payer: Self-pay

## 2019-02-12 ENCOUNTER — Encounter: Payer: Medicare Other | Attending: General Surgery | Admitting: Dietician

## 2019-02-12 DIAGNOSIS — E669 Obesity, unspecified: Secondary | ICD-10-CM | POA: Diagnosis present

## 2019-02-12 DIAGNOSIS — E1169 Type 2 diabetes mellitus with other specified complication: Secondary | ICD-10-CM | POA: Diagnosis not present

## 2019-02-12 NOTE — Progress Notes (Signed)
Bariatric Follow-Up Visit Medical Nutrition Therapy  Appt Start Time: 8:50am  End Time: 9:15am  9 Months Post Op Bariatric Surgery  Primary Concerns Today: Bariatric Surgery Nutrition Follow Up   Bariatric Surgery Type: RYGB     Surgery Date: 05/01/2018   NUTRITION ASSESSMENT   Anthropometrics  Start weight at [facility]: 351.1 lbs  Today's weight: 267 lbs  Body Composition Scale 08/28/2018 10/09/2018 11/13/2018 02/12/2019  Weight 294.1 290 278 267  BMI 56 55.7 54.3 52  Total Body Fat % 51.3 51.1 49.6 -     Visceral Fat 25 25 22  -  Fat-Free Mass % 48.6 48.8 50.3 -     Total Body Water % 38.8 38.9 39.6 -     Muscle-Mass lbs 28.6 28.6 29.4 -  Body Fat Displacement             Torso  lbs 93.7 91.9 85.7 -         Left Leg  lbs 18.7 18.3 17.1 -         Right Leg  lbs 18.7 18.3 17.1 -         Left Arm  lbs 9.3 9.1 8.5 -         Right Arm   lbs 9.3 9.1 8.5 -    Pt states she is tolerating foods and fluids well. May have sausage for breakfast. May snack on ham and cheese roll up or sugar free jello for snack. Other foods eaten include chicken nuggets, quesadillas, and shredded wheat cereal.   24-Hr Dietary Recall First Meal: protein drink + grits  Snack: cheese stick  Second Meal: chicken noodle soup  Snack: sugar free jello + sugar free whipped cream Third Meal: baked tilapia  Snack: - Beverages: water, 5 calorie juice   Estimated Daily Fluid Intake: 80+ ounces  Estimated Daily Protein Intake:  50-60 g Supplements: bariatric MVI, calcium  Current average weekly physical activity: walking around the house and neighborhood    Signs/Symptoms  Using straws: no Drinking while eating: no Chewing/swallowing difficulties: no Changes in vision: no Changes to mood/headaches: no Hair loss/changes to skin/nails: no Difficulty focusing/concentrating: no Sweating: no Dizziness/lightheadedness: no Palpitations: no Carbonated/caffeinated beverages: no N/V/D/C/Gas: no Abdominal pain:  no Dumping syndrome: no     NUTRITION DIAGNOSIS  Overweight/obesity ([place]-3.3) related to past poor dietary habits and physical inactivity as evidenced by patient w/ completed RYGB surgery following dietary guidelines for continued weight loss and healthy nutrition status.     NUTRITION INTERVENTION Nutrition counseling (C-1) and education (E-2) to facilitate bariatric surgery goals, including:  Advancement to diet phase 6 to include fruit  The importance of consuming adequate calories as well as certain nutrients daily due to the body's need for essential vitamins, minerals, and fats  The importance of daily physical activity and to reach a goal of at least 150 minutes of moderate to vigorous physical activity weekly (or as directed by their physician) due to benefits such as increased musculature and improved lab values  Handouts Provided Include   Phase 6: Protein + All Vegetables + Fruit   Readiness for Change: Ready Demonstrated degree of understanding via: Teach Back     MONITORING & EVALUATION Dietary intake, weekly physical activity, and body weight follow up in 3 months.  Patient is to return to [facility] in 3 months for 12 Month Post-Op visit.

## 2019-02-14 DIAGNOSIS — E1169 Type 2 diabetes mellitus with other specified complication: Secondary | ICD-10-CM | POA: Diagnosis not present

## 2019-02-14 DIAGNOSIS — E1165 Type 2 diabetes mellitus with hyperglycemia: Secondary | ICD-10-CM | POA: Diagnosis not present

## 2019-02-14 DIAGNOSIS — J9601 Acute respiratory failure with hypoxia: Secondary | ICD-10-CM | POA: Diagnosis not present

## 2019-02-14 DIAGNOSIS — J45901 Unspecified asthma with (acute) exacerbation: Secondary | ICD-10-CM | POA: Diagnosis not present

## 2019-02-15 ENCOUNTER — Other Ambulatory Visit: Payer: Self-pay | Admitting: Cardiology

## 2019-03-05 DIAGNOSIS — L110 Acquired keratosis follicularis: Secondary | ICD-10-CM | POA: Diagnosis not present

## 2019-03-05 DIAGNOSIS — E1140 Type 2 diabetes mellitus with diabetic neuropathy, unspecified: Secondary | ICD-10-CM | POA: Diagnosis not present

## 2019-03-05 DIAGNOSIS — L609 Nail disorder, unspecified: Secondary | ICD-10-CM | POA: Diagnosis not present

## 2019-03-15 ENCOUNTER — Other Ambulatory Visit: Payer: Self-pay | Admitting: Cardiology

## 2019-03-15 DIAGNOSIS — E1169 Type 2 diabetes mellitus with other specified complication: Secondary | ICD-10-CM | POA: Diagnosis not present

## 2019-03-15 DIAGNOSIS — E1165 Type 2 diabetes mellitus with hyperglycemia: Secondary | ICD-10-CM | POA: Diagnosis not present

## 2019-03-17 DIAGNOSIS — J45901 Unspecified asthma with (acute) exacerbation: Secondary | ICD-10-CM | POA: Diagnosis not present

## 2019-03-17 DIAGNOSIS — J9601 Acute respiratory failure with hypoxia: Secondary | ICD-10-CM | POA: Diagnosis not present

## 2019-03-19 DIAGNOSIS — N189 Chronic kidney disease, unspecified: Secondary | ICD-10-CM | POA: Diagnosis not present

## 2019-03-19 DIAGNOSIS — E1122 Type 2 diabetes mellitus with diabetic chronic kidney disease: Secondary | ICD-10-CM | POA: Diagnosis not present

## 2019-03-19 DIAGNOSIS — E211 Secondary hyperparathyroidism, not elsewhere classified: Secondary | ICD-10-CM | POA: Diagnosis not present

## 2019-03-19 DIAGNOSIS — E1129 Type 2 diabetes mellitus with other diabetic kidney complication: Secondary | ICD-10-CM | POA: Diagnosis not present

## 2019-03-19 DIAGNOSIS — R809 Proteinuria, unspecified: Secondary | ICD-10-CM | POA: Diagnosis not present

## 2019-03-19 DIAGNOSIS — N170 Acute kidney failure with tubular necrosis: Secondary | ICD-10-CM | POA: Diagnosis not present

## 2019-03-29 DIAGNOSIS — R809 Proteinuria, unspecified: Secondary | ICD-10-CM | POA: Diagnosis not present

## 2019-03-29 DIAGNOSIS — E875 Hyperkalemia: Secondary | ICD-10-CM | POA: Diagnosis not present

## 2019-03-29 DIAGNOSIS — E211 Secondary hyperparathyroidism, not elsewhere classified: Secondary | ICD-10-CM | POA: Diagnosis not present

## 2019-03-29 DIAGNOSIS — N189 Chronic kidney disease, unspecified: Secondary | ICD-10-CM | POA: Diagnosis not present

## 2019-03-29 DIAGNOSIS — I129 Hypertensive chronic kidney disease with stage 1 through stage 4 chronic kidney disease, or unspecified chronic kidney disease: Secondary | ICD-10-CM | POA: Diagnosis not present

## 2019-03-31 DIAGNOSIS — E039 Hypothyroidism, unspecified: Secondary | ICD-10-CM | POA: Diagnosis not present

## 2019-03-31 DIAGNOSIS — N183 Chronic kidney disease, stage 3 unspecified: Secondary | ICD-10-CM | POA: Diagnosis not present

## 2019-03-31 DIAGNOSIS — I129 Hypertensive chronic kidney disease with stage 1 through stage 4 chronic kidney disease, or unspecified chronic kidney disease: Secondary | ICD-10-CM | POA: Diagnosis not present

## 2019-03-31 DIAGNOSIS — E1122 Type 2 diabetes mellitus with diabetic chronic kidney disease: Secondary | ICD-10-CM | POA: Diagnosis not present

## 2019-04-10 DIAGNOSIS — E1129 Type 2 diabetes mellitus with other diabetic kidney complication: Secondary | ICD-10-CM | POA: Diagnosis not present

## 2019-04-10 DIAGNOSIS — E875 Hyperkalemia: Secondary | ICD-10-CM | POA: Diagnosis not present

## 2019-04-10 DIAGNOSIS — R809 Proteinuria, unspecified: Secondary | ICD-10-CM | POA: Diagnosis not present

## 2019-04-10 DIAGNOSIS — E1122 Type 2 diabetes mellitus with diabetic chronic kidney disease: Secondary | ICD-10-CM | POA: Diagnosis not present

## 2019-04-10 DIAGNOSIS — N189 Chronic kidney disease, unspecified: Secondary | ICD-10-CM | POA: Diagnosis not present

## 2019-04-12 ENCOUNTER — Other Ambulatory Visit: Payer: Self-pay | Admitting: Cardiology

## 2019-04-17 DIAGNOSIS — E1169 Type 2 diabetes mellitus with other specified complication: Secondary | ICD-10-CM | POA: Diagnosis not present

## 2019-04-17 DIAGNOSIS — E1165 Type 2 diabetes mellitus with hyperglycemia: Secondary | ICD-10-CM | POA: Diagnosis not present

## 2019-04-17 DIAGNOSIS — J9601 Acute respiratory failure with hypoxia: Secondary | ICD-10-CM | POA: Diagnosis not present

## 2019-04-17 DIAGNOSIS — J45901 Unspecified asthma with (acute) exacerbation: Secondary | ICD-10-CM | POA: Diagnosis not present

## 2019-05-03 DIAGNOSIS — K912 Postsurgical malabsorption, not elsewhere classified: Secondary | ICD-10-CM | POA: Diagnosis not present

## 2019-05-03 DIAGNOSIS — Z9884 Bariatric surgery status: Secondary | ICD-10-CM | POA: Diagnosis not present

## 2019-05-03 DIAGNOSIS — R69 Illness, unspecified: Secondary | ICD-10-CM | POA: Diagnosis not present

## 2019-05-07 DIAGNOSIS — K912 Postsurgical malabsorption, not elsewhere classified: Secondary | ICD-10-CM | POA: Diagnosis not present

## 2019-05-07 DIAGNOSIS — E1122 Type 2 diabetes mellitus with diabetic chronic kidney disease: Secondary | ICD-10-CM | POA: Diagnosis not present

## 2019-05-07 DIAGNOSIS — N183 Chronic kidney disease, stage 3 unspecified: Secondary | ICD-10-CM | POA: Diagnosis not present

## 2019-05-07 DIAGNOSIS — I10 Essential (primary) hypertension: Secondary | ICD-10-CM | POA: Diagnosis not present

## 2019-05-07 DIAGNOSIS — E039 Hypothyroidism, unspecified: Secondary | ICD-10-CM | POA: Diagnosis not present

## 2019-05-13 ENCOUNTER — Other Ambulatory Visit: Payer: Self-pay

## 2019-05-13 ENCOUNTER — Encounter: Payer: Self-pay | Admitting: Cardiology

## 2019-05-13 ENCOUNTER — Ambulatory Visit: Payer: Medicare Other | Admitting: Cardiology

## 2019-05-13 VITALS — BP 130/71 | HR 62 | Temp 97.7°F | Resp 14 | Ht 60.0 in | Wt 268.0 lb

## 2019-05-13 DIAGNOSIS — I5032 Chronic diastolic (congestive) heart failure: Secondary | ICD-10-CM

## 2019-05-13 DIAGNOSIS — I10 Essential (primary) hypertension: Secondary | ICD-10-CM

## 2019-05-13 DIAGNOSIS — N1832 Chronic kidney disease, stage 3b: Secondary | ICD-10-CM | POA: Diagnosis not present

## 2019-05-13 DIAGNOSIS — Z6841 Body Mass Index (BMI) 40.0 and over, adult: Secondary | ICD-10-CM

## 2019-05-13 MED ORDER — GABAPENTIN 300 MG PO CAPS: 300.0000 mg | ORAL_CAPSULE | Freq: Four times a day (QID) | ORAL | 2 refills | Status: AC

## 2019-05-13 NOTE — Progress Notes (Signed)
Primary Physician/Referring:  Iona Beard, MD  Patient ID: [name], female    DOB: 23-Apr-1965, 54 y.o.   MRN: [id number]  Chief Complaint  Patient presents with  . Hypertension  . Chronic Diastolic CHF    6 month follow up  . Congestive Heart Failure    [name]  is a 54 y.o. 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  She presents for follow-up of diastolic heart failure and hypertension.  She has become more compliant with diet.  Dyspnea is remained stable, no change in her leg edema.  She is tolerating all her medications well and requests Neurontin Rx refills.   Past Medical History:  Diagnosis Date  . Acute asthma exacerbation 12/21/2014  . Acute renal failure ([place])   . ARF (acute renal failure) ([place]) 05/03/2014  . Asthma   . Asthma exacerbation 05/03/2014  . Asthma, severe persistent 05/03/2014  . Chronic diastolic CHF (congestive heart failure) ([place]) 06/21/2018  . Diabetes mellitus   . Diabetes mellitus type 2 in obese ([employer]) 05/03/2014  . History of echocardiogram 10/9240   LVH, diastolic dysfunction  . Hyperlipidemia   . Hypertension   . Hyponatremia 12/02/2014  . Obesity   . Preop examination 05/18/2017  . SOB (shortness of breath) 12/22/2014    Past Surgical History:  Procedure Laterality Date  . CARDIAC CATHETERIZATION  03/2013   normal coronary arteries, EF 55%  . CARDIAC CATHETERIZATION  03/2013   normal coronary arteries  . CATARACT EXTRACTION W/PHACO Right 12/10/2012   Procedure: CATARACT EXTRACTION PHACO AND INTRAOCULAR LENS PLACEMENT ([place]);  Surgeon: Tonny Branch, MD;  Location: [facility];  Service: Ophthalmology;  Laterality: Right;  CDE:22..42  . CATARACT EXTRACTION W/PHACO Left 05/11/2015   Procedure: CATARACT EXTRACTION PHACO AND INTRAOCULAR LENS  PLACEMENT LEFT EYE CDE=6.54;  Surgeon: Tonny Branch, MD;  Location: [facility];  Service: Ophthalmology;  Laterality: Left;  . COLONOSCOPY N/A 09/22/2017   Procedure: COLONOSCOPY;  Surgeon: Danie Binder, MD;  Location: [facility] SUITE;  Service: Endoscopy;  Laterality: N/A;  12:15  . EYE SURGERY Left   . GASTRIC BYPASS  05/01/2018  . GASTRIC ROUX-EN-Y N/A 05/01/2018   Procedure: LAPAROSCOPIC ROUX-EN-Y GASTRIC BYPASS WITH UPPER ENDOSCOPY WITH ERAS PATHWAY;  Surgeon: Kinsinger, Arta Bruce, MD;  Location: [facility];  Service: General;  Laterality: N/A;  . LEFT HEART CATHETERIZATION WITH CORONARY ANGIOGRAM N/A 04/09/2013   Procedure: LEFT HEART CATHETERIZATION WITH CORONARY ANGIOGRAM;  Surgeon: Laverda Page, MD;  Location: [facility] CATH LAB;  Service: Cardiovascular;  Laterality: N/A;  . TRACHEOSTOMY     at age 11 from asthma attack   Social History   Tobacco Use  . Smoking status: Never Smoker  . Smokeless tobacco: Never Used  Substance Use Topics  . Alcohol use: Not Currently     Current Outpatient Medications on File Prior to Visit  Medication Sig Dispense Refill  . acetaminophen (TYLENOL) 500 MG tablet Take 500 mg by mouth every 6 (six) hours as needed for moderate pain.     Marland Kitchen albuterol (PROVENTIL HFA;VENTOLIN HFA) 108 (90 BASE) MCG/ACT inhaler Inhale 2 puffs into the lungs every 6 (six) hours as needed for wheezing.    Marland Kitchen albuterol (PROVENTIL) (2.5 MG/3ML) 0.083% nebulizer solution Take 3 mLs (2.5 mg total) by nebulization every 6 (six) hours as needed for wheezing or shortness of  breath. 75 mL 12  . amLODipine (NORVASC) 5 MG tablet TAKE ONE TABLET BY MOUTH ONCE DAILY. 90 tablet 0  . atorvastatin (LIPITOR) 20 MG tablet Take 20 mg by mouth daily.     . Calcium Carbonate-Vitamin D (CALCIUM 600+D PO) Take 1 tablet by mouth daily.    . carboxymethylcellulose (REFRESH PLUS) 0.5 % SOLN Place 1 drop into both eyes 3 (three) times daily as needed (dry eyes).    . carvedilol (COREG) 6.25 MG tablet Take 6.25  mg by mouth 2 (two) times daily.     . Cholecalciferol (VITAMIN D) 2000 units tablet Take 2,000 Units by mouth daily.     . Fluticasone-Salmeterol (ADVAIR) 100-50 MCG/DOSE AEPB Inhale 1 puff into the lungs 2 (two) times daily.     . Insulin Human (INSULIN PUMP) SOLN Inject into the skin.    Marland Kitchen levothyroxine (SYNTHROID, LEVOTHROID) 75 MCG tablet Take 75 mcg by mouth daily before breakfast.    . montelukast (SINGULAIR) 10 MG tablet Take 10 mg by mouth daily.     . Multiple Vitamins-Minerals (MULTIVITAMIN WITH MINERALS) tablet Take 1 tablet by mouth daily.    . pantoprazole (PROTONIX) 40 MG tablet Take 1 tablet (40 mg total) by mouth daily. 90 tablet 0  . torsemide (DEMADEX) 20 MG tablet Take 20 mg by mouth 2 (two) times a day. Two tablets in morning and one in the evening     No current facility-administered medications on file prior to visit.   Review of Systems  Cardiovascular: Positive for dyspnea on exertion (stable) and leg swelling. Negative for syncope.  Neurological: Positive for dizziness. Negative for loss of balance.  All other systems reviewed and are negative.  Objective:  Blood pressure 130/71, pulse 62, temperature 97.7 F (36.5 C), temperature source Temporal, resp. rate 14, height 5' (1.524 m), weight 268 lb (121.6 kg), last menstrual period 09/11/2013, SpO2 100 %. Body mass index is 52.34 kg/m.  Vitals with BMI 05/13/2019 05/13/2019 02/12/2019  Height 5\' 0"  5\' 0"  -  Weight 268 lbs 268 lbs 8 oz 267 lbs  BMI 62.95 28.41 -  Systolic 324 401 -  Diastolic 71 76 -  Pulse 62 61 -    Physical Exam  Constitutional: She appears well-developed. No distress.  Morbidly obese  Neck:  Short neck and difficult to evaluate JVP  Cardiovascular: Normal rate, regular rhythm and normal heart sounds. Exam reveals no gallop.  No murmur heard. Pulses:      Carotid pulses are 2+ on the right side and 2+ on the left side.      Dorsalis pedis pulses are 2+ on the right side and 2+ on the  left side.       Posterior tibial pulses are 2+ on the right side and 2+ on the left side.  Femoral and popliteal pulse difficult to feel due to patient's body habitus.  Chronic venostasis changes noted in the lower extremity along with 2+ pitting edema.  Skin has the appearance of a Peu-de-orange. 1-2 + pitting edema also present. JVD difficult to make out due to short neck.  Pulmonary/Chest: Effort normal and breath sounds normal.  Abdominal: Soft. Bowel sounds are normal.  Obese. Pannus present   Radiology: No results found. Laboratory Examination:   CMP Latest Ref Rng & Units 05/02/2018 04/25/2018 03/24/2015  Glucose 70 - 99 mg/dL 203(H) 141(H) 180(H)  BUN 6 - 20 mg/dL 63(H) 71(H) 39(H)  Creatinine 0.44 - 1.00 mg/dL 1.58(H) 1.79(H) 1.59(H)  Sodium 135 -  145 mmol/L 137 140 139  Potassium 3.5 - 5.1 mmol/L 4.0 4.0 4.3  Chloride 98 - 111 mmol/L 106 103 102  CO2 22 - 32 mmol/L 24 26 29   Calcium 8.9 - 10.3 mg/dL 8.6(L) 9.4 9.1  Total Protein 6.5 - 8.1 g/dL 6.5 7.7 6.8  Total Bilirubin 0.3 - 1.2 mg/dL 0.4 0.3 0.4  Alkaline Phos 38 - 126 U/L 76 83 80  AST 15 - 41 U/L 22 15 21   ALT 0 - 44 U/L 19 14 15   zzz CBC Latest Ref Rng & Units 05/02/2018 05/01/2018 04/25/2018  WBC 4.0 - 10.5 K/uL 15.5(H) - 10.6(H)  Hemoglobin 12.0 - 15.0 g/dL 10.4(L) 10.7(L) 11.2(L)  Hematocrit 36.0 - 46.0 % 35.3(L) 35.6(L) 37.2  Platelets 150 - 400 K/uL 216 - 227   Lipid Panel  No results found for: CHOL, TRIG, HDL, CHOLHDL, VLDL, LDLCALC, LDLDIRECT HEMOGLOBIN A1C Lab Results  Component Value Date   HGBA1C 6.7 (H) 04/25/2018   MPG 145.59 04/25/2018   External labs: Cholesterol, total 169.000 M 10/31/2017 HDL 44.000 M 10/31/2017 LDL 134 mg% Triglycerides 44.000 M 10/31/2017  A1C 6.600 11/07/2018; TSH 1.650 07/31/2018  Hemoglobin 10.100 G/ 01/03/2019  Creatinine, Serum 1.720 MG/ 01/03/2019 Potassium 4.500 mm 07/31/2018 ALT (SGPT) 12.000 U/L 05/15/2018  Cardiac studies:   US Venous Img Lower Unilateral Left  03/13/2015: No evidence of left lower extremity DVT. 55 Sleep Study 2016: Uses nocturnal O2. Negative sleep study for apnea. On Home O2. Did not tolerate CPAP  Sleep study 2011: Dx'd with sleep apnea-does not use a CPAP. On Nocturnal O2 therapy.  Coronary angiogram 04/09/2013: Normal coronary arteries, normal left ventricle systolic function. Markedly elevated LVEDP of 31 mmHg.  Echo 02/17/11: No apical views. Grossly preserved LVEF. Moderate LVH, moderate left atrial enlargement.  Lexiscan stress 02/25/11: Mild gut uptake artifact. Normal perfusion. Normal LVEF. Low risk stress   EKG 05/13/2019: Normal sinus rhythm at rate of 65 bpm, normal axis.  No evidence of ischemia, normal EKG.  Borderline low voltage complexes.  No significant change from 11/12/2018.  Assessment:      ICD-10-CM   1. Chronic diastolic CHF (congestive heart failure) (HCC)  I50.32 EKG 12-Lead  2. Essential hypertension  I10   3. Stage 3b chronic kidney disease  N18.32   4. Class 3 severe obesity due to excess calories with serious comorbidity and body mass index (BMI) of 50.0 to 59.9 in adult [facility])  E66.01    Z68.43      Recommendations:    [name]  is a 54 y.o. 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  She underwent gastric bypass on 05/01/2018. I BEEN SEEING HER FOR FREQUENTLY TO IMPROVE COMPLIANCE WITH WEIGHT LOSS. Patient is here on a six-month office visit and follow-up of hypertension as well.   She has had occasional episodes of dizziness when she suddenly stands up but otherwise presently doing well.  She has been gradually losing weight.  No change in symptoms, vascular examination and cardiac examination are unchanged and EKG is normal.  Blood pressure is now well controlled.  No changes in  the medications were done today.  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 She has no PND or  orthopnea, dyspnea has remained stable.   External labs reviewed, she also had labs yesterday and do not have access. I have encouraged her to watch calories and continue to be careful with diet. I will see her PRN.   Adrian Prows, MD, [facility] 05/13/2019, 9:39 AM [facility]. [place] Office: [phone]

## 2019-05-14 ENCOUNTER — Other Ambulatory Visit: Payer: Self-pay

## 2019-05-14 ENCOUNTER — Encounter: Payer: Medicare Other | Attending: General Surgery | Admitting: Skilled Nursing Facility1

## 2019-05-14 DIAGNOSIS — E669 Obesity, unspecified: Secondary | ICD-10-CM | POA: Diagnosis present

## 2019-05-14 DIAGNOSIS — E1169 Type 2 diabetes mellitus with other specified complication: Secondary | ICD-10-CM | POA: Diagnosis not present

## 2019-05-14 NOTE — Patient Instructions (Addendum)
-  Sodium: 1500 mg per day   -On the nutrition facts label Limit to single digits of fat and sugar  -Avoid canned soups and frozen meals and chinese food  -Be sure to have non starchy vegetables with lunch and dinner 7 days a week  -Do not add butter or bacon grease to any of your foods; 1 table spoon of oil is okay  -For cereal: double digits fiber (or close to it), singles digits or fat and sugar   -Be sure to drink at least 1 bottle of plain water every day   -Switch to a one a day capsule for your multi: still of the 4 company's recommended with iron

## 2019-05-14 NOTE — Progress Notes (Signed)
Bariatric Follow-Up Visit Medical Nutrition Therapy  Appt Start Time: 8:50am  End Time: 9:15am  9 Months Post Op Bariatric Surgery  Primary Concerns Today: Bariatric Surgery Nutrition Follow Up   Bariatric Surgery Type: RYGB     Surgery Date: 05/01/2018   NUTRITION ASSESSMENT   Anthropometrics  Start weight at [facility]: 351.1 lbs  Today's weight: 265 lbs  Body Composition Scale 08/28/2018 10/09/2018 11/13/2018 05/14/2019  Weight 294.1 290 278 265.6  BMI 56 55.7 54.3 51  Total Body Fat % 51.3 51.1 49.6 49.4     Visceral Fat 25 25 22 22   Fat-Free Mass % 48.6 48.8 50.3 50.5     Total Body Water % 38.8 38.9 39.6 39.7     Muscle-Mass lbs 28.6 28.6 29.4 28.3  Body Fat Displacement             Torso  lbs 93.7 91.9 85.7 81.5         Left Leg  lbs 18.7 18.3 17.1 16.3         Right Leg  lbs 18.7 18.3 17.1 16.3         Left Arm  lbs 9.3 9.1 8.5 8.1         Right Arm   lbs 9.3 9.1 8.5 8.1    Pt has an insulin pump and does a bolus at meals.   Pt states in the morning she gets dizzy and sometimes it is just while laying down. Pt states she will start checking her blood pressure at home.  Pt became dizzy after bending over to take her shows off.  Pt states he was recently taken off potassium and lipitor stating she sees her nephrologist every 2 months. Pt states he checks her blood sugar 4 times a day: FBS 76; later in the day 90's and at bedtime 120. Pt states she got a BS of 250 realizing she drank orange juice. Pt states 2 weeks ago she was feeling dizzy and sweaty so she checked her BS (in the 30's) stating she ate 1 packet grits sausage and half protein shake (8am-BS 78) then jello and cheese (12) 1:30 fixing lunch had the low (did not do a bolus for breakfast or lunch).  Pt states her neice is going to show her how to cook with the airfryer.  Pts mother cooks for her and uses high fat seasoning. Pt is open to try new recipes.  Pt states he is realizing she might be bored eating lately.    K: 4.0 (WNL) Calcium: 8.6 (low)    Medical dx: CKD stage 3, DM type 2, CHF, HTN  24-Hr Dietary Recall First Meal: 1/2 protein drink + grits + sausage or pancake and scrambled eggs Snack: cheese stick and sugar free jello Second Meal: chicken noodle soup or frozen meal Snack: sugar free jello + sugar free whipped cream Third Meal: baked tilapia  Snack: - Beverages: water with flavoring, 5 calorie juice, orange juice   Estimated Daily Fluid Intake: 80+ ounces  Estimated Daily Protein Intake:  50-60 g Supplements: bariatric MVI, calcium  Current average weekly physical activity: walking around the house and neighborhood sometimes Wii or resistance bands   Signs/Symptoms  Using straws: no Drinking while eating: no Chewing/swallowing difficulties: no Changes in vision: no Changes to mood/headaches: no Hair loss/changes to skin/nails: no Difficulty focusing/concentrating: no Sweating: no Dizziness/lightheadedness: no Palpitations: no Carbonated/caffeinated beverages: no N/V/D/C/Gas: no Abdominal pain: no Dumping syndrome: no     NUTRITION DIAGNOSIS  Overweight/obesity ([place]-3.3)  related to past poor dietary habits and physical inactivity as evidenced by patient w/ completed RYGB surgery following dietary guidelines for continued weight loss and healthy nutrition status.    NUTRITION INTERVENTION Nutrition counseling (C-1) and education (E-2) to facilitate bariatric surgery goals, including:  Advancement to diet phase 6 to include fruit  The importance of consuming adequate calories as well as certain nutrients daily due to the body's need for essential vitamins, minerals, and fats  The importance of daily physical activity and to reach a goal of at least 150 minutes of moderate to vigorous physical activity weekly (or as directed by their physician) due to benefits such as increased musculature and improved lab values  Goals: -Sodium: 1500 mg per day  -On the  nutrition facts label Limit to single digits of fat and sugar -Avoid canned soups and frozen meals and chinese food -Be sure to have non starchy vegetables with lunch and dinner 7 days a week -Do not add butter or bacon grease to any of your foods; 1 table spoon of oil is okay -For cereal: double digits fiber (or close to it), singles digits or fat and sugar  -Be sure to drink at least 1 bottle of plain water every day  -Switch to a one a day capsule for your multi: still of the 4 company's recommended with iron  Handouts Provided Include   Maintaince folder handouts  Readiness for Change: Ready Demonstrated degree of understanding via: Teach Back     MONITORING & EVALUATION Dietary intake, weekly physical activity, and body weight follow up in 3 months.  Patient is to return to [facility] in 6 weeks

## 2019-05-18 DIAGNOSIS — E1169 Type 2 diabetes mellitus with other specified complication: Secondary | ICD-10-CM | POA: Diagnosis not present

## 2019-05-18 DIAGNOSIS — E1165 Type 2 diabetes mellitus with hyperglycemia: Secondary | ICD-10-CM | POA: Diagnosis not present

## 2019-05-28 DIAGNOSIS — L110 Acquired keratosis follicularis: Secondary | ICD-10-CM | POA: Diagnosis not present

## 2019-05-28 DIAGNOSIS — E1140 Type 2 diabetes mellitus with diabetic neuropathy, unspecified: Secondary | ICD-10-CM | POA: Diagnosis not present

## 2019-05-28 DIAGNOSIS — L609 Nail disorder, unspecified: Secondary | ICD-10-CM | POA: Diagnosis not present

## 2019-05-31 DIAGNOSIS — N189 Chronic kidney disease, unspecified: Secondary | ICD-10-CM | POA: Diagnosis not present

## 2019-05-31 DIAGNOSIS — E1122 Type 2 diabetes mellitus with diabetic chronic kidney disease: Secondary | ICD-10-CM | POA: Diagnosis not present

## 2019-05-31 DIAGNOSIS — E1129 Type 2 diabetes mellitus with other diabetic kidney complication: Secondary | ICD-10-CM | POA: Diagnosis not present

## 2019-05-31 DIAGNOSIS — R809 Proteinuria, unspecified: Secondary | ICD-10-CM | POA: Diagnosis not present

## 2019-05-31 DIAGNOSIS — E211 Secondary hyperparathyroidism, not elsewhere classified: Secondary | ICD-10-CM | POA: Diagnosis not present

## 2019-05-31 DIAGNOSIS — E875 Hyperkalemia: Secondary | ICD-10-CM | POA: Diagnosis not present

## 2019-06-05 DIAGNOSIS — I129 Hypertensive chronic kidney disease with stage 1 through stage 4 chronic kidney disease, or unspecified chronic kidney disease: Secondary | ICD-10-CM | POA: Diagnosis not present

## 2019-06-05 DIAGNOSIS — R809 Proteinuria, unspecified: Secondary | ICD-10-CM | POA: Diagnosis not present

## 2019-06-05 DIAGNOSIS — N189 Chronic kidney disease, unspecified: Secondary | ICD-10-CM | POA: Diagnosis not present

## 2019-06-05 DIAGNOSIS — E875 Hyperkalemia: Secondary | ICD-10-CM | POA: Diagnosis not present

## 2019-06-05 DIAGNOSIS — E211 Secondary hyperparathyroidism, not elsewhere classified: Secondary | ICD-10-CM | POA: Diagnosis not present

## 2019-06-17 DIAGNOSIS — E1129 Type 2 diabetes mellitus with other diabetic kidney complication: Secondary | ICD-10-CM | POA: Diagnosis not present

## 2019-06-17 DIAGNOSIS — E1122 Type 2 diabetes mellitus with diabetic chronic kidney disease: Secondary | ICD-10-CM | POA: Diagnosis not present

## 2019-06-17 DIAGNOSIS — N189 Chronic kidney disease, unspecified: Secondary | ICD-10-CM | POA: Diagnosis not present

## 2019-06-17 DIAGNOSIS — E875 Hyperkalemia: Secondary | ICD-10-CM | POA: Diagnosis not present

## 2019-06-17 DIAGNOSIS — R809 Proteinuria, unspecified: Secondary | ICD-10-CM | POA: Diagnosis not present

## 2019-06-24 ENCOUNTER — Encounter: Payer: Medicare Other | Attending: General Surgery | Admitting: Skilled Nursing Facility1

## 2019-06-24 ENCOUNTER — Other Ambulatory Visit: Payer: Self-pay

## 2019-06-24 DIAGNOSIS — E669 Obesity, unspecified: Secondary | ICD-10-CM | POA: Diagnosis present

## 2019-06-24 DIAGNOSIS — E1169 Type 2 diabetes mellitus with other specified complication: Secondary | ICD-10-CM | POA: Insufficient documentation

## 2019-06-24 DIAGNOSIS — N1832 Chronic kidney disease, stage 3b: Secondary | ICD-10-CM

## 2019-06-24 NOTE — Patient Instructions (Addendum)
-  do your workout videos every day; 7 days a week

## 2019-06-24 NOTE — Progress Notes (Signed)
Bariatric Follow-Up Visit Medical Nutrition Therapy  Appt Start Time: 8:50am  End Time: 9:15am  9 Months Post Op Bariatric Surgery  Primary Concerns Today: Bariatric Surgery Nutrition Follow Up   Bariatric Surgery Type: RYGB     Surgery Date: 05/01/2018   NUTRITION ASSESSMENT   Anthropometrics  Start weight at [facility]: 351.1 lbs  Today's weight: 266 lbs  Body Composition Scale 10/09/2018 11/13/2018 05/14/2019 06/24/2019  Weight 290 278 265.6 266.8  BMI 55.7 54.3 51 51.3  Total Body Fat % 51.1 49.6 49.4 49.5     Visceral Fat _0 Fat-Free Mass % 48.8 50.3 50.5 50.4     Total Body Water % 38.9 39.6 39.7 39.7     Muscle-Mass lbs 28.6 29.4 28.3 28.3  Body Fat Displacement             Torso  lbs 91.9 85.7 81.5 82         Left Leg  lbs 18.3 17.1 16.3 16.4         Right Leg  lbs 18.3 17.1 16.3 16.4         Left Arm  lbs 9.1 8.5 8.1 8.2         Right Arm   lbs 9.1 8.5 8.1 8.2    Pt has an insulin pump and does a bolus at meals.    Pt states her neice is going to show her how to cook with the airfryer.  Pts mother cooks for her and uses high fat seasoning. Pt is open to try new recipes.  Pt states he is realizing she might be bored eating lately.   Pt states her fasting is 79 and the afternoon is 120. Pt states she has not been experiencing any lows. Pt state she feels she needs to see the psychologist  because she feels stuck with emotional eating. Pt states he ate 2 boxes of reeses pieces realizing she does not want to go back into that behavior.   For the next appt having had met with her psychologist.    Medical dx: CKD stage 3, DM type 2, CHF, HTN  24-Hr Dietary Recall First Meal: 1/2-1 protein drink + grits or boiled egg + protein drink Snack: cheese stick and sugar free jello and candy Second Meal 12:30: tuna salad or chicken on salad + [place] dressing or fat free ranch or mayo or sandwich and dry cereal  Snack: sugar free jello + sugar free whipped cream or  applesauce or cheese and candy Third Meal: baked tilapia or chicken + black eyed peas + potato salad Snack: jello or trailmix Beverages: water with flavoring, 5 calorie juice   Estimated Daily Fluid Intake: 80+ ounces  Estimated Daily Protein Intake:  50-60 g Supplements: bariatric MVI, calcium  Current average weekly physical activity: walking around the house and neighborhood sometimes Wii or resistance bands   Signs/Symptoms  Using straws: no Drinking while eating: no Chewing/swallowing difficulties: no Changes in vision: no Changes to mood/headaches: no Hair loss/changes to skin/nails: no Difficulty focusing/concentrating: no Sweating: no Dizziness/lightheadedness: no Palpitations: no Carbonated/caffeinated beverages: no N/V/D/C/Gas: no Abdominal pain: no Dumping syndrome: no     NUTRITION DIAGNOSIS  Overweight/obesity ([place]-3.3) related to past poor dietary habits and physical inactivity as evidenced by patient w/ completed RYGB surgery following dietary guidelines for continued weight loss and healthy nutrition status.    NUTRITION INTERVENTION Nutrition counseling (C-1) and education (E-2) to facilitate bariatric surgery goals, including:  The importance of consuming  adequate calories as well as certain nutrients daily due to the body's need for essential vitamins, minerals, and fats  The importance of daily physical activity and to reach a goal of at least 150 minutes of moderate to vigorous physical activity weekly (or as directed by their physician) due to benefits such as increased musculature and improved lab values  Goals: -do your workout videos every day; 7 days a week -contact your psychologist  -great job on recognizing your unhealthy food behaviors   Handouts Provided Include    Readiness for Change: Ready Demonstrated degree of understanding via: Teach Back     MONITORING & EVALUATION Dietary intake, weekly physical activity, and body weight follow  up in 3 months.  Patient is to return to [facility]

## 2019-07-02 DIAGNOSIS — H43821 Vitreomacular adhesion, right eye: Secondary | ICD-10-CM | POA: Diagnosis not present

## 2019-07-02 DIAGNOSIS — H35372 Puckering of macula, left eye: Secondary | ICD-10-CM | POA: Diagnosis not present

## 2019-07-02 DIAGNOSIS — E103593 Type 1 diabetes mellitus with proliferative diabetic retinopathy without macular edema, bilateral: Secondary | ICD-10-CM | POA: Diagnosis not present

## 2019-07-02 DIAGNOSIS — H3582 Retinal ischemia: Secondary | ICD-10-CM | POA: Diagnosis not present

## 2019-08-06 DIAGNOSIS — E1129 Type 2 diabetes mellitus with other diabetic kidney complication: Secondary | ICD-10-CM | POA: Diagnosis not present

## 2019-08-06 DIAGNOSIS — E875 Hyperkalemia: Secondary | ICD-10-CM | POA: Diagnosis not present

## 2019-08-06 DIAGNOSIS — N189 Chronic kidney disease, unspecified: Secondary | ICD-10-CM | POA: Diagnosis not present

## 2019-08-06 DIAGNOSIS — R809 Proteinuria, unspecified: Secondary | ICD-10-CM | POA: Diagnosis not present

## 2019-08-06 DIAGNOSIS — E1122 Type 2 diabetes mellitus with diabetic chronic kidney disease: Secondary | ICD-10-CM | POA: Diagnosis not present

## 2019-08-07 DIAGNOSIS — M79662 Pain in left lower leg: Secondary | ICD-10-CM | POA: Diagnosis not present

## 2019-08-07 DIAGNOSIS — I129 Hypertensive chronic kidney disease with stage 1 through stage 4 chronic kidney disease, or unspecified chronic kidney disease: Secondary | ICD-10-CM | POA: Diagnosis not present

## 2019-08-07 DIAGNOSIS — I10 Essential (primary) hypertension: Secondary | ICD-10-CM | POA: Diagnosis not present

## 2019-08-07 DIAGNOSIS — E039 Hypothyroidism, unspecified: Secondary | ICD-10-CM | POA: Diagnosis not present

## 2019-08-07 DIAGNOSIS — Z0000 Encounter for general adult medical examination without abnormal findings: Secondary | ICD-10-CM | POA: Diagnosis not present

## 2019-08-12 DIAGNOSIS — I10 Essential (primary) hypertension: Secondary | ICD-10-CM | POA: Diagnosis not present

## 2019-08-12 DIAGNOSIS — R799 Abnormal finding of blood chemistry, unspecified: Secondary | ICD-10-CM | POA: Diagnosis not present

## 2019-08-12 DIAGNOSIS — E039 Hypothyroidism, unspecified: Secondary | ICD-10-CM | POA: Diagnosis not present

## 2019-08-12 DIAGNOSIS — E119 Type 2 diabetes mellitus without complications: Secondary | ICD-10-CM | POA: Diagnosis not present

## 2019-08-12 DIAGNOSIS — I129 Hypertensive chronic kidney disease with stage 1 through stage 4 chronic kidney disease, or unspecified chronic kidney disease: Secondary | ICD-10-CM | POA: Diagnosis not present

## 2019-08-14 DIAGNOSIS — N170 Acute kidney failure with tubular necrosis: Secondary | ICD-10-CM | POA: Diagnosis not present

## 2019-08-14 DIAGNOSIS — R809 Proteinuria, unspecified: Secondary | ICD-10-CM | POA: Diagnosis not present

## 2019-08-14 DIAGNOSIS — E211 Secondary hyperparathyroidism, not elsewhere classified: Secondary | ICD-10-CM | POA: Diagnosis not present

## 2019-08-14 DIAGNOSIS — E875 Hyperkalemia: Secondary | ICD-10-CM | POA: Diagnosis not present

## 2019-08-14 DIAGNOSIS — N189 Chronic kidney disease, unspecified: Secondary | ICD-10-CM | POA: Diagnosis not present

## 2019-08-27 DIAGNOSIS — N189 Chronic kidney disease, unspecified: Secondary | ICD-10-CM | POA: Diagnosis not present

## 2019-08-27 DIAGNOSIS — E1122 Type 2 diabetes mellitus with diabetic chronic kidney disease: Secondary | ICD-10-CM | POA: Diagnosis not present

## 2019-08-27 DIAGNOSIS — E1129 Type 2 diabetes mellitus with other diabetic kidney complication: Secondary | ICD-10-CM | POA: Diagnosis not present

## 2019-08-27 DIAGNOSIS — E875 Hyperkalemia: Secondary | ICD-10-CM | POA: Diagnosis not present

## 2019-08-27 DIAGNOSIS — R809 Proteinuria, unspecified: Secondary | ICD-10-CM | POA: Diagnosis not present

## 2019-08-29 DIAGNOSIS — R809 Proteinuria, unspecified: Secondary | ICD-10-CM | POA: Diagnosis not present

## 2019-08-29 DIAGNOSIS — E875 Hyperkalemia: Secondary | ICD-10-CM | POA: Diagnosis not present

## 2019-08-29 DIAGNOSIS — I129 Hypertensive chronic kidney disease with stage 1 through stage 4 chronic kidney disease, or unspecified chronic kidney disease: Secondary | ICD-10-CM | POA: Diagnosis not present

## 2019-08-29 DIAGNOSIS — N189 Chronic kidney disease, unspecified: Secondary | ICD-10-CM | POA: Diagnosis not present

## 2019-08-29 DIAGNOSIS — N170 Acute kidney failure with tubular necrosis: Secondary | ICD-10-CM | POA: Diagnosis not present

## 2019-09-12 DIAGNOSIS — L110 Acquired keratosis follicularis: Secondary | ICD-10-CM | POA: Diagnosis not present

## 2019-09-12 DIAGNOSIS — E1140 Type 2 diabetes mellitus with diabetic neuropathy, unspecified: Secondary | ICD-10-CM | POA: Diagnosis not present

## 2019-09-12 DIAGNOSIS — L609 Nail disorder, unspecified: Secondary | ICD-10-CM | POA: Diagnosis not present

## 2019-09-25 ENCOUNTER — Other Ambulatory Visit: Payer: Self-pay

## 2019-09-25 ENCOUNTER — Encounter: Payer: Medicare Other | Attending: General Surgery | Admitting: Skilled Nursing Facility1

## 2019-09-25 DIAGNOSIS — N170 Acute kidney failure with tubular necrosis: Secondary | ICD-10-CM | POA: Diagnosis not present

## 2019-09-25 DIAGNOSIS — N1832 Chronic kidney disease, stage 3b: Secondary | ICD-10-CM | POA: Insufficient documentation

## 2019-09-25 DIAGNOSIS — E1129 Type 2 diabetes mellitus with other diabetic kidney complication: Secondary | ICD-10-CM | POA: Diagnosis not present

## 2019-09-25 DIAGNOSIS — E1122 Type 2 diabetes mellitus with diabetic chronic kidney disease: Secondary | ICD-10-CM | POA: Diagnosis not present

## 2019-09-25 DIAGNOSIS — Z7689 Persons encountering health services in other specified circumstances: Secondary | ICD-10-CM | POA: Diagnosis not present

## 2019-09-25 DIAGNOSIS — N189 Chronic kidney disease, unspecified: Secondary | ICD-10-CM | POA: Diagnosis not present

## 2019-09-25 DIAGNOSIS — R809 Proteinuria, unspecified: Secondary | ICD-10-CM | POA: Diagnosis not present

## 2019-09-25 NOTE — Progress Notes (Signed)
Bariatric Follow-Up Visit Medical Nutrition Therapy  Appt Start Time: 8:50am  End Time: 9:15am  9 Months Post Op Bariatric Surgery  Primary Concerns Today: Bariatric Surgery Nutrition Follow Up   Bariatric Surgery Type: RYGB     Surgery Date: 05/01/2018   NUTRITION ASSESSMENT   Anthropometrics  Start weight at [facility]: 351.1 lbs  Today's weight: 269.3 lbs  Body Composition Scale 10/09/2018 11/13/2018 05/14/2019 06/24/2019 09/25/2019  Weight 290 278 265.6 266.8 269.3  BMI 55.7 54.3 51 51.3 51.8  Total Body Fat % 51.1 49.6 49.4 49.5 49.8     Visceral Fat 25 22 22 22 23   Fat-Free Mass % 48.8 50.3 50.5 50.4 50.1     Total Body Water % 38.9 39.6 39.7 39.7 39.5     Muscle-Mass lbs 28.6 29.4 28.3 28.3 28.2  Body Fat Displacement              Torso  lbs 91.9 85.7 81.5 82 83.3         Left Leg  lbs 18.3 17.1 16.3 16.4 16.6         Right Leg  lbs 18.3 17.1 16.3 16.4 16.6         Left Arm  lbs 9.1 8.5 8.1 8.2 8.3         Right Arm   lbs 9.1 8.5 8.1 8.2 8.3    Pt has an insulin pump and does a bolus at meals.   Pts mother cooks for her and uses high fat seasoning. Pt is open to try new recipes.   Pt arrives with Bilateral pedal edema.  Pt state she has not found a psychologist yet but does plan on calling her insurance company today to see who is covered.  Pt states she has tried some recipes in the airfryer which she has liked and is excited about. Pt states she she is not a big fan of most fish.  Pt states she has been walking about 3 days a week walking with cane and able to catch her breath. Pt states she is Off lisinopril and lowered fluid pill. Checking blood sugar 8am before eating anything: 71 in the evening 91 checking 3 times a day  Pt states she will get labs taken today. Pt states she is still working on drinking plain water Pt states she will be going to [place] to visit her cousin. Pt states she uses paper plates with sections for portion control.   Pt is struggling with  emotional eating.   Medical dx: CKD stage 3, DM type 2, CHF, HTN  24-Hr Dietary Recall First Meal: 1/2-1 protein drink + grits + bacon bits or boiled egg + protein drink + applesauce  Snack: cheese stick and sugar free jello and candy Second Meal 12:30: tuna salad or chicken on salad + [place] dressing or fat free ranch or mayo or sandwich and dry cereal or lunch meat sandwich  Snack: sugar free jello + sugar free whipped cream or applesauce or cheese and candy Third Meal: baked tilapia or chicken + black eyed peas + potato salad or canned spinach or green beans + olive oil Snack: jello or trailmix Beverages: water with flavoring, 5 calorie juice   Estimated Daily Fluid Intake: 80+ ounces  Estimated Daily Protein Intake:  50-60 g Supplements: bariatric MVI, calcium  Current average weekly physical activity: walking around the house and neighborhood sometimes Wii or resistance bands    Signs/Symptoms  Using straws: no Drinking while eating: no Chewing/swallowing difficulties:  no Changes in vision: no Changes to mood/headaches: no Hair loss/changes to skin/nails: no Difficulty focusing/concentrating: no Sweating: no Dizziness/lightheadedness: no Palpitations: no Carbonated/caffeinated beverages: no N/V/D/C/Gas: no Abdominal pain: no Dumping syndrome: no     NUTRITION DIAGNOSIS  Overweight/obesity ([place]-3.3) related to past poor dietary habits and physical inactivity as evidenced by patient w/ completed RYGB surgery following dietary guidelines for continued weight loss and healthy nutrition status.    NUTRITION INTERVENTION Nutrition counseling (C-1) and education (E-2) to facilitate bariatric surgery goals, including:  The importance of consuming adequate calories as well as certain nutrients daily due to the body's need for essential vitamins, minerals, and fats  The importance of daily physical activity and to reach a goal of at least 150 minutes of moderate to vigorous  physical activity weekly (or as directed by their physician) due to benefits such as increased musculature and improved lab values  Goals:  Check blood sugars 2 hours after any meal  Get a psychologist  Avoid eating bacon bits Try low sodium lunch meat  Use your check list handout for mindfulness with meals Avoid high sodium foods   Handouts Provided Include  Mindful meals check list  Readiness for Change: Ready Demonstrated degree of understanding via: Teach Back     MONITORING & EVALUATION Dietary intake, weekly physical activity, and body weight follow up in 1 month.  Patient is to return to [facility]

## 2019-09-26 DIAGNOSIS — I129 Hypertensive chronic kidney disease with stage 1 through stage 4 chronic kidney disease, or unspecified chronic kidney disease: Secondary | ICD-10-CM | POA: Diagnosis not present

## 2019-09-26 DIAGNOSIS — R600 Localized edema: Secondary | ICD-10-CM | POA: Diagnosis not present

## 2019-09-26 DIAGNOSIS — R809 Proteinuria, unspecified: Secondary | ICD-10-CM | POA: Diagnosis not present

## 2019-09-26 DIAGNOSIS — N170 Acute kidney failure with tubular necrosis: Secondary | ICD-10-CM | POA: Diagnosis not present

## 2019-09-26 DIAGNOSIS — N189 Chronic kidney disease, unspecified: Secondary | ICD-10-CM | POA: Diagnosis not present

## 2019-09-27 DIAGNOSIS — E1122 Type 2 diabetes mellitus with diabetic chronic kidney disease: Secondary | ICD-10-CM | POA: Diagnosis not present

## 2019-09-27 DIAGNOSIS — E039 Hypothyroidism, unspecified: Secondary | ICD-10-CM | POA: Diagnosis not present

## 2019-09-27 DIAGNOSIS — I129 Hypertensive chronic kidney disease with stage 1 through stage 4 chronic kidney disease, or unspecified chronic kidney disease: Secondary | ICD-10-CM | POA: Diagnosis not present

## 2019-09-27 DIAGNOSIS — N1832 Chronic kidney disease, stage 3b: Secondary | ICD-10-CM | POA: Diagnosis not present

## 2019-10-24 ENCOUNTER — Encounter: Payer: Medicare Other | Attending: General Surgery | Admitting: Skilled Nursing Facility1

## 2019-10-24 ENCOUNTER — Other Ambulatory Visit: Payer: Self-pay

## 2019-10-24 DIAGNOSIS — N1832 Chronic kidney disease, stage 3b: Secondary | ICD-10-CM | POA: Diagnosis not present

## 2019-10-24 DIAGNOSIS — E1169 Type 2 diabetes mellitus with other specified complication: Secondary | ICD-10-CM

## 2019-10-24 NOTE — Progress Notes (Signed)
Bariatric Follow-Up Visit Medical Nutrition Therapy  Appt Start Time: 8:50am  End Time: 9:15am  9 Months Post Op Bariatric Surgery  Primary Concerns Today: Bariatric Surgery Nutrition Follow Up   Bariatric Surgery Type: RYGB     Surgery Date: 05/01/2018   NUTRITION ASSESSMENT   Anthropometrics  Start weight at [facility]: 351.1 lbs  Today's weight: 260 lbs  Body Composition Scale 05/14/2019 06/24/2019 09/25/2019 10/24/2019  Weight 265.6 266.8 269.3 260  BMI 51 51.3 51.8 49.9  Total Body Fat % 49.4 49.5 49.8 49.1     Visceral Fat 22 22 23 22   Fat-Free Mass % 50.5 50.4 50.1 50.8     Total Body Water % 39.7 39.7 39.5 39.9     Muscle-Mass lbs 28.3 28.3 28.2 28.1  Body Fat Displacement             Torso  lbs 81.5 82 83.3 79.2         Left Leg  lbs 16.3 16.4 16.6 15.8         Right Leg  lbs 16.3 16.4 16.6 15.8         Left Arm  lbs 8.1 8.2 8.3 7.9         Right Arm   lbs 8.1 8.2 8.3 7.9    Pt has an insulin pump and does a bolus at meals.   Pts mother cooks for her and uses high fat seasoning. Pt is open to try new recipes.   Checking blood sugar 8am before eating anything: 71 in the evening 91 checking 3 times a day   Pt states her doctor has her on a fluid pill regimen to decrease her fluid retention. Pt states emotional eating has gotten better by avoiding eating after 8pm and measuring her foods. Pt states she really loves bacon and sausage and hates the [place] variety stating she avoids bacon Dietitian advised pt sausage is also not a healthy choice for kidney and heart health. Pt state she has been trying to cook more in the oven and avoid fried foods. Pt states it is a learning experiencing and she is still learning but proud of herself having taken her first flight to [place] a friend which she is very proud of. Pt state sshe tries to use equal instead of regular sugar in her vegetables.  Pt state she is going to start keeping prunes in the fridge for random times of constipation  stating she does not have this issue regular.  Pt states she loves sourer kraut.  Pt states she recently got some teeth pulled.   Medical dx: CKD stage 3, DM type 2, CHF, HTN  24-Hr Dietary Recall First Meal: 1 piece bacon and 1 egg or with a protein shake or egg + grits Snack: cheese stick and sugar free jello and candy Second Meal 12:30: tuna salad or chicken on salad + [place] dressing or fat free ranch or mayo or sandwich and dry cereal or lunch meat sandwich  Snack: sugar free jello + sugar free whipped cream or applesauce or cheese and candy Third Meal: baked tilapia or chicken + black eyed peas + potato salad or canned spinach or green beans + olive oil Snack: jello or trailmix Beverages: water with flavoring, 5 calorie juice   Estimated Daily Fluid Intake: 80+ ounces  Estimated Daily Protein Intake:  50-60 g Supplements: bariatric MVI, calcium  Current average weekly physical activity: walking around the house and neighborhood sometimes Wii or resistance bands  Signs/Symptoms  Using straws: no Drinking while eating: no Chewing/swallowing difficulties: no Changes in vision: no Changes to mood/headaches: no Hair loss/changes to skin/nails: no Difficulty focusing/concentrating: no Sweating: no Dizziness/lightheadedness: no Palpitations: no Carbonated/caffeinated beverages: no N/V/D/C/Gas: no Abdominal pain: no Dumping syndrome: no     NUTRITION DIAGNOSIS  Overweight/obesity ([place]-3.3) related to past poor dietary habits and physical inactivity as evidenced by patient w/ completed RYGB surgery following dietary guidelines for continued weight loss and healthy nutrition status.  Handouts: Meal ideas with appropriate portion sizes    NUTRITION INTERVENTION Nutrition counseling (C-1) and education (E-2) to facilitate bariatric surgery goals, including:  The importance of consuming adequate calories as well as certain nutrients daily due to the body's need for essential  vitamins, minerals, and fats  The importance of daily physical activity and to reach a goal of at least 150 minutes of moderate to vigorous physical activity weekly (or as directed by their physician) due to benefits such as increased musculature and improved lab values  Goals: -Continue to avoid adding butter, sugar, and salt to foods  Handouts Provided Include  Mindful meals check list  Readiness for Change: Ready Demonstrated degree of understanding via: Teach Back     MONITORING & EVALUATION Dietary intake, weekly physical activity, and body weight follow up in 3 months.  Patient is to return to [facility]

## 2019-10-29 DIAGNOSIS — E039 Hypothyroidism, unspecified: Secondary | ICD-10-CM | POA: Diagnosis not present

## 2019-10-29 DIAGNOSIS — E1122 Type 2 diabetes mellitus with diabetic chronic kidney disease: Secondary | ICD-10-CM | POA: Diagnosis not present

## 2019-10-29 DIAGNOSIS — N1832 Chronic kidney disease, stage 3b: Secondary | ICD-10-CM | POA: Diagnosis not present

## 2019-10-29 DIAGNOSIS — I129 Hypertensive chronic kidney disease with stage 1 through stage 4 chronic kidney disease, or unspecified chronic kidney disease: Secondary | ICD-10-CM | POA: Diagnosis not present

## 2019-11-12 DIAGNOSIS — N183 Chronic kidney disease, stage 3 unspecified: Secondary | ICD-10-CM | POA: Diagnosis not present

## 2019-11-12 DIAGNOSIS — E039 Hypothyroidism, unspecified: Secondary | ICD-10-CM | POA: Diagnosis not present

## 2019-11-12 DIAGNOSIS — Z23 Encounter for immunization: Secondary | ICD-10-CM | POA: Diagnosis not present

## 2019-11-12 DIAGNOSIS — E1122 Type 2 diabetes mellitus with diabetic chronic kidney disease: Secondary | ICD-10-CM | POA: Diagnosis not present

## 2019-11-21 DIAGNOSIS — Z79899 Other long term (current) drug therapy: Secondary | ICD-10-CM | POA: Diagnosis not present

## 2019-11-21 DIAGNOSIS — E559 Vitamin D deficiency, unspecified: Secondary | ICD-10-CM | POA: Diagnosis not present

## 2019-11-21 DIAGNOSIS — E1122 Type 2 diabetes mellitus with diabetic chronic kidney disease: Secondary | ICD-10-CM | POA: Diagnosis not present

## 2019-11-21 DIAGNOSIS — R809 Proteinuria, unspecified: Secondary | ICD-10-CM | POA: Diagnosis not present

## 2019-11-21 DIAGNOSIS — N170 Acute kidney failure with tubular necrosis: Secondary | ICD-10-CM | POA: Diagnosis not present

## 2019-11-21 DIAGNOSIS — E1129 Type 2 diabetes mellitus with other diabetic kidney complication: Secondary | ICD-10-CM | POA: Diagnosis not present

## 2019-11-21 DIAGNOSIS — N189 Chronic kidney disease, unspecified: Secondary | ICD-10-CM | POA: Diagnosis not present

## 2019-11-27 DIAGNOSIS — Z794 Long term (current) use of insulin: Secondary | ICD-10-CM | POA: Diagnosis not present

## 2019-11-27 DIAGNOSIS — D631 Anemia in chronic kidney disease: Secondary | ICD-10-CM | POA: Diagnosis not present

## 2019-11-27 DIAGNOSIS — E108 Type 1 diabetes mellitus with unspecified complications: Secondary | ICD-10-CM | POA: Diagnosis not present

## 2019-11-27 DIAGNOSIS — R809 Proteinuria, unspecified: Secondary | ICD-10-CM | POA: Diagnosis not present

## 2019-11-27 DIAGNOSIS — N189 Chronic kidney disease, unspecified: Secondary | ICD-10-CM | POA: Diagnosis not present

## 2019-11-27 DIAGNOSIS — E211 Secondary hyperparathyroidism, not elsewhere classified: Secondary | ICD-10-CM | POA: Diagnosis not present

## 2019-11-27 DIAGNOSIS — I129 Hypertensive chronic kidney disease with stage 1 through stage 4 chronic kidney disease, or unspecified chronic kidney disease: Secondary | ICD-10-CM | POA: Diagnosis not present

## 2019-12-03 ENCOUNTER — Other Ambulatory Visit (HOSPITAL_COMMUNITY): Payer: Self-pay | Admitting: Family Medicine

## 2019-12-03 DIAGNOSIS — Z1231 Encounter for screening mammogram for malignant neoplasm of breast: Secondary | ICD-10-CM

## 2019-12-11 ENCOUNTER — Other Ambulatory Visit: Payer: Self-pay

## 2019-12-11 ENCOUNTER — Ambulatory Visit (HOSPITAL_COMMUNITY)
Admission: RE | Admit: 2019-12-11 | Discharge: 2019-12-11 | Disposition: A | Discharge status: Home / Self Care | Payer: Medicare Other | Source: Ambulatory Visit | Attending: Family Medicine | Admitting: Family Medicine

## 2019-12-11 DIAGNOSIS — Z1231 Encounter for screening mammogram for malignant neoplasm of breast: Secondary | ICD-10-CM | POA: Insufficient documentation

## 2019-12-19 DIAGNOSIS — E1140 Type 2 diabetes mellitus with diabetic neuropathy, unspecified: Secondary | ICD-10-CM | POA: Diagnosis not present

## 2019-12-19 DIAGNOSIS — L609 Nail disorder, unspecified: Secondary | ICD-10-CM | POA: Diagnosis not present

## 2019-12-19 DIAGNOSIS — L110 Acquired keratosis follicularis: Secondary | ICD-10-CM | POA: Diagnosis not present

## 2019-12-23 DIAGNOSIS — Z794 Long term (current) use of insulin: Secondary | ICD-10-CM | POA: Diagnosis not present

## 2019-12-23 DIAGNOSIS — E108 Type 1 diabetes mellitus with unspecified complications: Secondary | ICD-10-CM | POA: Diagnosis not present

## 2019-12-31 DIAGNOSIS — E103593 Type 1 diabetes mellitus with proliferative diabetic retinopathy without macular edema, bilateral: Secondary | ICD-10-CM | POA: Diagnosis not present

## 2019-12-31 DIAGNOSIS — H35372 Puckering of macula, left eye: Secondary | ICD-10-CM | POA: Diagnosis not present

## 2019-12-31 DIAGNOSIS — H3582 Retinal ischemia: Secondary | ICD-10-CM | POA: Diagnosis not present

## 2019-12-31 DIAGNOSIS — H43821 Vitreomacular adhesion, right eye: Secondary | ICD-10-CM | POA: Diagnosis not present

## 2020-01-20 ENCOUNTER — Other Ambulatory Visit: Payer: Self-pay

## 2020-01-20 ENCOUNTER — Encounter: Payer: Medicare Other | Attending: General Surgery | Admitting: Skilled Nursing Facility1

## 2020-01-20 DIAGNOSIS — N183 Chronic kidney disease, stage 3 unspecified: Secondary | ICD-10-CM | POA: Diagnosis not present

## 2020-01-20 DIAGNOSIS — Z9884 Bariatric surgery status: Secondary | ICD-10-CM | POA: Diagnosis not present

## 2020-01-20 DIAGNOSIS — I509 Heart failure, unspecified: Secondary | ICD-10-CM | POA: Insufficient documentation

## 2020-01-20 DIAGNOSIS — I130 Hypertensive heart and chronic kidney disease with heart failure and stage 1 through stage 4 chronic kidney disease, or unspecified chronic kidney disease: Secondary | ICD-10-CM | POA: Diagnosis not present

## 2020-01-20 DIAGNOSIS — E1122 Type 2 diabetes mellitus with diabetic chronic kidney disease: Secondary | ICD-10-CM | POA: Insufficient documentation

## 2020-01-20 DIAGNOSIS — E669 Obesity, unspecified: Secondary | ICD-10-CM

## 2020-01-20 NOTE — Progress Notes (Signed)
Bariatric Follow-Up Visit Medical Nutrition Therapy  Appt Start Time: 8:50am  End Time: 9:11am   Primary Concerns Today: Bariatric Surgery Nutrition Follow Up   Bariatric Surgery Type: RYGB     Surgery Date: 05/01/2018   NUTRITION ASSESSMENT   Anthropometrics  Start weight at [facility]: 351.1 lbs  Today's weight: pt declined  Body Composition Scale 05/14/2019 06/24/2019 09/25/2019 10/24/2019  Weight 265.6 266.8 269.3 260  BMI 51 51.3 51.8 49.9  Total Body Fat % 49.4 49.5 49.8 49.1     Visceral Fat 22 22 23 22   Fat-Free Mass % 50.5 50.4 50.1 50.8     Total Body Water % 39.7 39.7 39.5 39.9     Muscle-Mass lbs 28.3 28.3 28.2 28.1  Body Fat Displacement             Torso  lbs 81.5 82 83.3 79.2         Left Leg  lbs 16.3 16.4 16.6 15.8         Right Leg  lbs 16.3 16.4 16.6 15.8         Left Arm  lbs 8.1 8.2 8.3 7.9         Right Arm   lbs 8.1 8.2 8.3 7.9   Pt states her fasting blood glucose is 75 and usually feels fine or drink juice if its below 75.  Pt states she tries to hit 6000 steps goals everyday. Pt states she is having trouble falling sleep until 2 am. Pt states she has been paying attention eating sugary food and limiting those. Pt states she make dip from sausage and refried beans and cream cheese: Dietitian suggested to limit foods that are high in saturated fat and salt.    Medical dx: CKD stage 3, DM type 2, CHF, HTN  24-Hr Dietary Recall First Meal: 1 piece bacon and egg or grit and egg, protein sake Snack: cheese stick and sugar free jello and candy Second Meal 12:30: cheese toast or half sandwich Snack:  Third Meal: green , pinto beans , meat balls Snack: peanut butter cracker, jello banana Beverages: water with flavoring, tea (half sweetened and half non)   Estimated Daily Fluid Intake: 80+ ounces  Estimated Daily Protein Intake:  50-60 g Supplements: bariatric MVI, calcium  Current average weekly physical activity: walking around the house and neighborhood  sometimes Wii or resistance bands, jumba 2 days a week    Signs/Symptoms  Using straws: no Drinking while eating: no Chewing/swallowing difficulties: no Changes in vision: no Changes to mood/headaches: no Hair loss/changes to skin/nails: no Difficulty focusing/concentrating: no Sweating: no Dizziness/lightheadedness: no Palpitations: no Carbonated/caffeinated beverages: no N/V/D/C/Gas: no Abdominal pain: no Dumping syndrome: no     NUTRITION DIAGNOSIS  Overweight/obesity ([place]-3.3) related to past poor dietary habits and physical inactivity as evidenced by patient w/ completed RYGB surgery following dietary guidelines for continued weight loss and healthy nutrition status.  Handouts: Meal ideas with appropriate portion sizes    NUTRITION INTERVENTION Nutrition counseling (C-1) and education (E-2) to facilitate bariatric surgery goals, including:  The importance of consuming adequate calories as well as certain nutrients daily due to the body's need for essential vitamins, minerals, and fats  The importance of daily physical activity and to reach a goal of at least 150 minutes of moderate to vigorous physical activity weekly (or as directed by their physician) due to benefits such as increased musculature and improved lab values  Goals: -Continue to avoid adding butter, sugar, and salt to foods  Checking blood glucose- fasting and 2 hr after meal Add more fruits and vegetables  Handouts Provided Include  Mindful meals check list  Readiness for Change: Ready Demonstrated degree of understanding via: Teach Back     MONITORING & EVALUATION Dietary intake, weekly physical activity, and body weight follow up in 3 months.  Patient is to return to [facility]

## 2020-01-24 DIAGNOSIS — Z794 Long term (current) use of insulin: Secondary | ICD-10-CM | POA: Diagnosis not present

## 2020-01-24 DIAGNOSIS — E108 Type 1 diabetes mellitus with unspecified complications: Secondary | ICD-10-CM | POA: Diagnosis not present

## 2020-01-28 DIAGNOSIS — I129 Hypertensive chronic kidney disease with stage 1 through stage 4 chronic kidney disease, or unspecified chronic kidney disease: Secondary | ICD-10-CM | POA: Diagnosis not present

## 2020-01-28 DIAGNOSIS — E211 Secondary hyperparathyroidism, not elsewhere classified: Secondary | ICD-10-CM | POA: Diagnosis not present

## 2020-01-28 DIAGNOSIS — N1832 Chronic kidney disease, stage 3b: Secondary | ICD-10-CM | POA: Diagnosis not present

## 2020-01-28 DIAGNOSIS — D631 Anemia in chronic kidney disease: Secondary | ICD-10-CM | POA: Diagnosis not present

## 2020-01-28 DIAGNOSIS — E1122 Type 2 diabetes mellitus with diabetic chronic kidney disease: Secondary | ICD-10-CM | POA: Diagnosis not present

## 2020-01-28 DIAGNOSIS — N189 Chronic kidney disease, unspecified: Secondary | ICD-10-CM | POA: Diagnosis not present

## 2020-01-28 DIAGNOSIS — E039 Hypothyroidism, unspecified: Secondary | ICD-10-CM | POA: Diagnosis not present

## 2020-01-29 DIAGNOSIS — E1122 Type 2 diabetes mellitus with diabetic chronic kidney disease: Secondary | ICD-10-CM | POA: Diagnosis not present

## 2020-01-29 DIAGNOSIS — N189 Chronic kidney disease, unspecified: Secondary | ICD-10-CM | POA: Diagnosis not present

## 2020-01-29 DIAGNOSIS — E211 Secondary hyperparathyroidism, not elsewhere classified: Secondary | ICD-10-CM | POA: Diagnosis not present

## 2020-01-29 DIAGNOSIS — R809 Proteinuria, unspecified: Secondary | ICD-10-CM | POA: Diagnosis not present

## 2020-01-29 DIAGNOSIS — I129 Hypertensive chronic kidney disease with stage 1 through stage 4 chronic kidney disease, or unspecified chronic kidney disease: Secondary | ICD-10-CM | POA: Diagnosis not present

## 2020-02-07 IMAGING — CR DG CHEST 2V
3 series · 3 of 3 positions shown · non-contrast
Comparison: 03/24/2015

CLINICAL DATA: Preop for gastric bypass.

EXAM:
CHEST - 2 VIEW

[w chest pa]
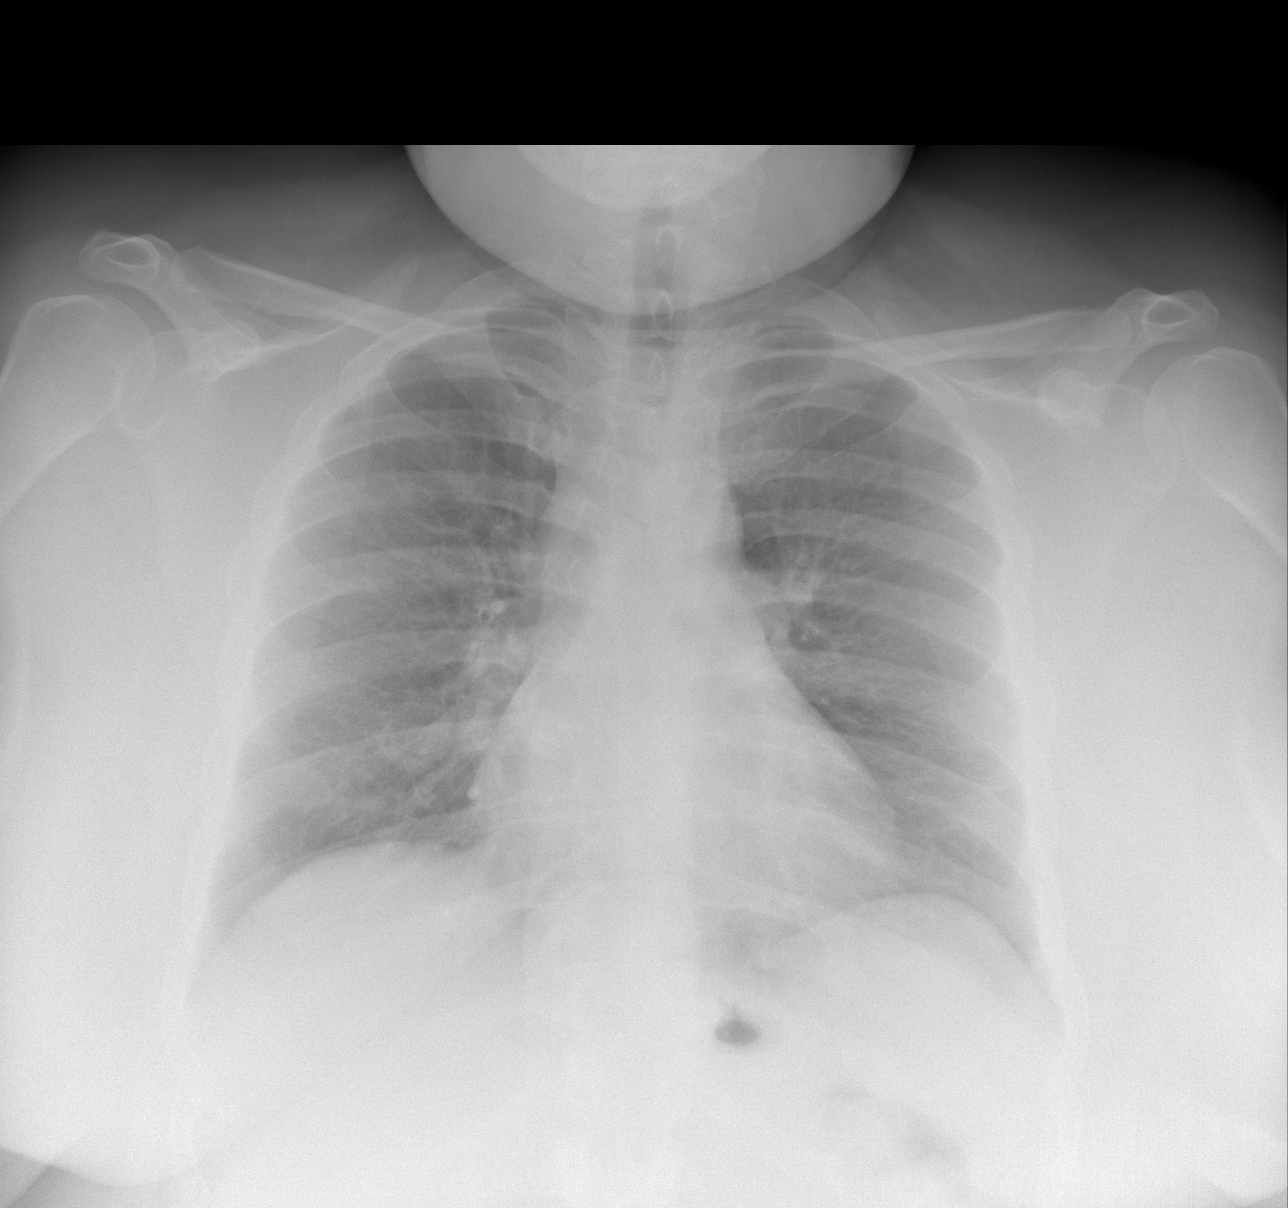

[t chest supine]
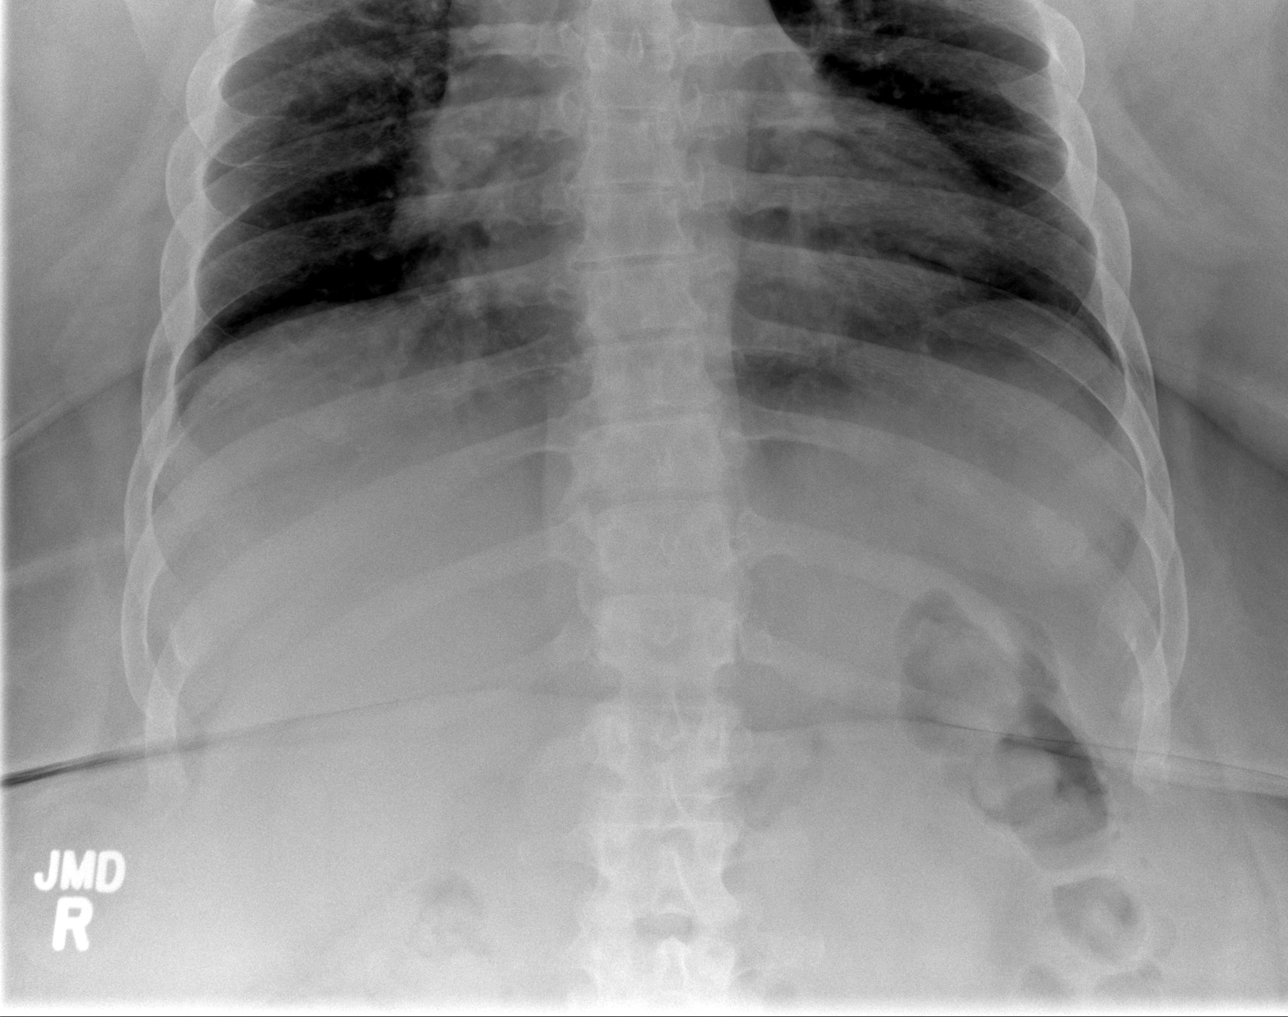

[w chest lat *]
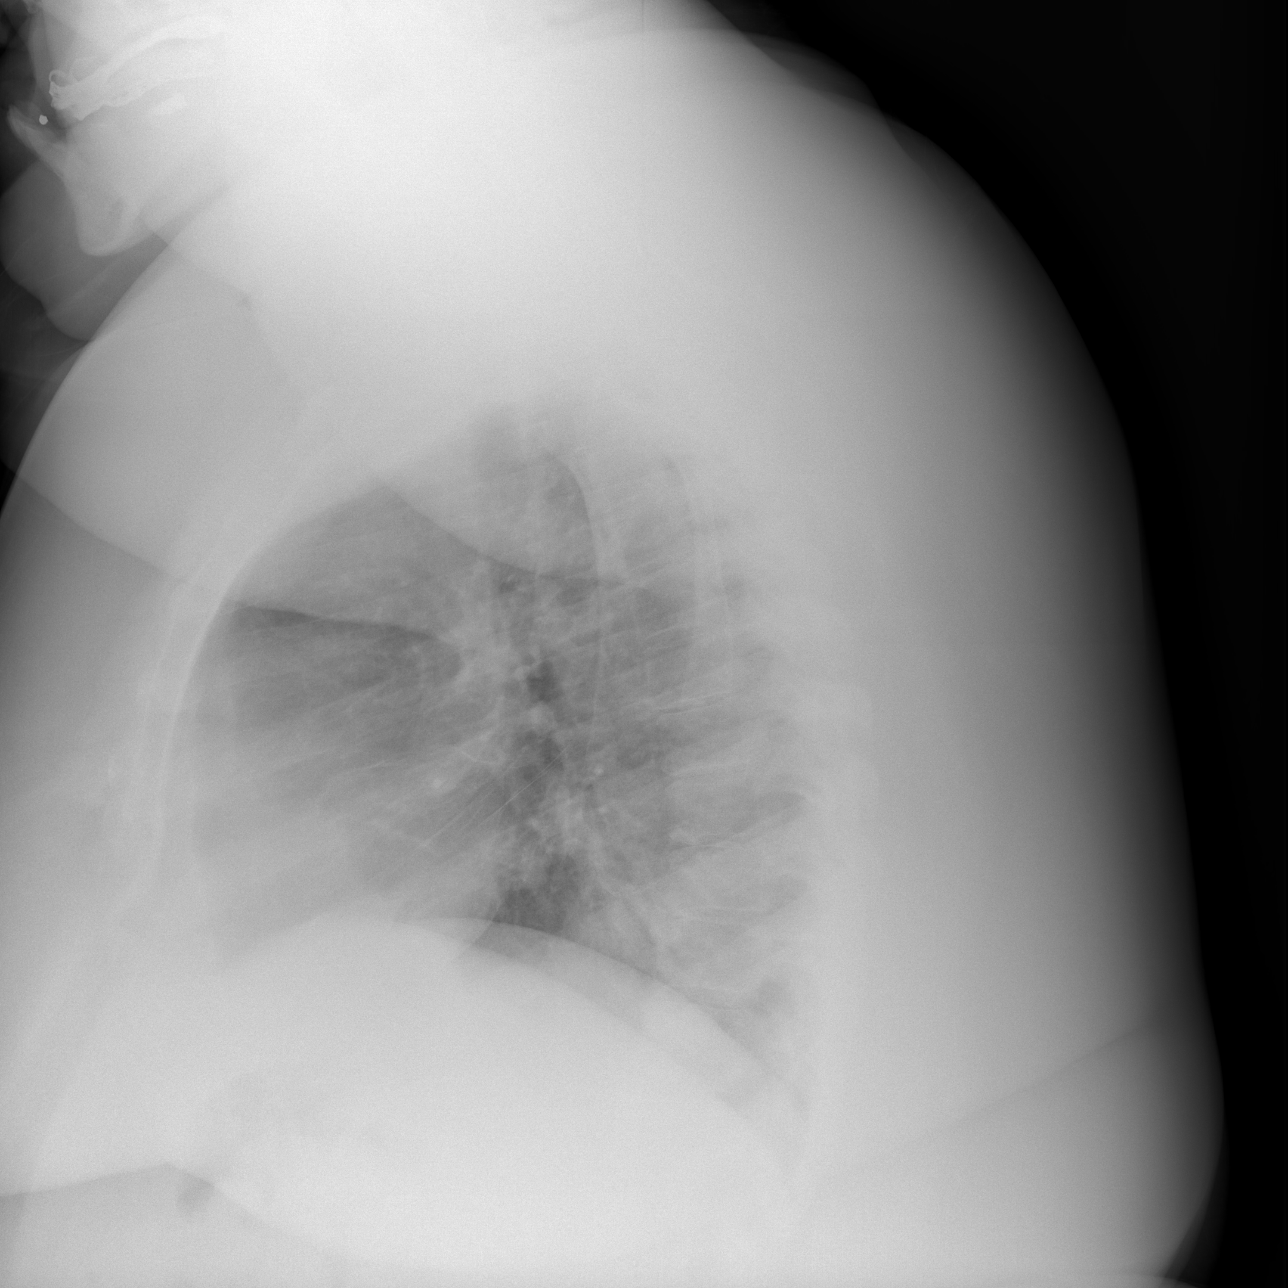

[3 of 3 positions shown; findings below may reference images not displayed]

FINDINGS: The heart size and mediastinal contours are within normal limits.
Both lungs are clear. The visualized skeletal structures are
unremarkable.
IMPRESSION: No active cardiopulmonary disease.

## 2020-02-11 DIAGNOSIS — N183 Chronic kidney disease, stage 3 unspecified: Secondary | ICD-10-CM | POA: Diagnosis not present

## 2020-02-11 DIAGNOSIS — E039 Hypothyroidism, unspecified: Secondary | ICD-10-CM | POA: Diagnosis not present

## 2020-02-11 DIAGNOSIS — E1122 Type 2 diabetes mellitus with diabetic chronic kidney disease: Secondary | ICD-10-CM | POA: Diagnosis not present

## 2020-02-11 DIAGNOSIS — E785 Hyperlipidemia, unspecified: Secondary | ICD-10-CM | POA: Diagnosis not present

## 2020-02-11 DIAGNOSIS — N185 Chronic kidney disease, stage 5: Secondary | ICD-10-CM | POA: Diagnosis not present

## 2020-02-11 DIAGNOSIS — I10 Essential (primary) hypertension: Secondary | ICD-10-CM | POA: Diagnosis not present

## 2020-02-19 DIAGNOSIS — H5213 Myopia, bilateral: Secondary | ICD-10-CM | POA: Diagnosis not present

## 2020-02-24 DIAGNOSIS — Z794 Long term (current) use of insulin: Secondary | ICD-10-CM | POA: Diagnosis not present

## 2020-02-24 DIAGNOSIS — E108 Type 1 diabetes mellitus with unspecified complications: Secondary | ICD-10-CM | POA: Diagnosis not present

## 2020-03-19 DIAGNOSIS — L609 Nail disorder, unspecified: Secondary | ICD-10-CM | POA: Diagnosis not present

## 2020-03-19 DIAGNOSIS — E1140 Type 2 diabetes mellitus with diabetic neuropathy, unspecified: Secondary | ICD-10-CM | POA: Diagnosis not present

## 2020-03-19 DIAGNOSIS — L110 Acquired keratosis follicularis: Secondary | ICD-10-CM | POA: Diagnosis not present

## 2020-03-25 DIAGNOSIS — E1122 Type 2 diabetes mellitus with diabetic chronic kidney disease: Secondary | ICD-10-CM | POA: Diagnosis not present

## 2020-03-25 DIAGNOSIS — R809 Proteinuria, unspecified: Secondary | ICD-10-CM | POA: Diagnosis not present

## 2020-03-25 DIAGNOSIS — I129 Hypertensive chronic kidney disease with stage 1 through stage 4 chronic kidney disease, or unspecified chronic kidney disease: Secondary | ICD-10-CM | POA: Diagnosis not present

## 2020-03-25 DIAGNOSIS — N189 Chronic kidney disease, unspecified: Secondary | ICD-10-CM | POA: Diagnosis not present

## 2020-03-25 DIAGNOSIS — E211 Secondary hyperparathyroidism, not elsewhere classified: Secondary | ICD-10-CM | POA: Diagnosis not present

## 2020-03-26 DIAGNOSIS — E108 Type 1 diabetes mellitus with unspecified complications: Secondary | ICD-10-CM | POA: Diagnosis not present

## 2020-03-26 DIAGNOSIS — Z794 Long term (current) use of insulin: Secondary | ICD-10-CM | POA: Diagnosis not present

## 2020-04-01 DIAGNOSIS — I129 Hypertensive chronic kidney disease with stage 1 through stage 4 chronic kidney disease, or unspecified chronic kidney disease: Secondary | ICD-10-CM | POA: Diagnosis not present

## 2020-04-01 DIAGNOSIS — E875 Hyperkalemia: Secondary | ICD-10-CM | POA: Diagnosis not present

## 2020-04-01 DIAGNOSIS — N189 Chronic kidney disease, unspecified: Secondary | ICD-10-CM | POA: Diagnosis not present

## 2020-04-01 DIAGNOSIS — E1122 Type 2 diabetes mellitus with diabetic chronic kidney disease: Secondary | ICD-10-CM | POA: Diagnosis not present

## 2020-04-01 DIAGNOSIS — R809 Proteinuria, unspecified: Secondary | ICD-10-CM | POA: Diagnosis not present

## 2020-04-01 DIAGNOSIS — E211 Secondary hyperparathyroidism, not elsewhere classified: Secondary | ICD-10-CM | POA: Diagnosis not present

## 2020-04-01 DIAGNOSIS — E1129 Type 2 diabetes mellitus with other diabetic kidney complication: Secondary | ICD-10-CM | POA: Diagnosis not present

## 2020-04-15 DIAGNOSIS — N189 Chronic kidney disease, unspecified: Secondary | ICD-10-CM | POA: Diagnosis not present

## 2020-04-15 DIAGNOSIS — E211 Secondary hyperparathyroidism, not elsewhere classified: Secondary | ICD-10-CM | POA: Diagnosis not present

## 2020-04-15 DIAGNOSIS — E875 Hyperkalemia: Secondary | ICD-10-CM | POA: Diagnosis not present

## 2020-04-15 DIAGNOSIS — I129 Hypertensive chronic kidney disease with stage 1 through stage 4 chronic kidney disease, or unspecified chronic kidney disease: Secondary | ICD-10-CM | POA: Diagnosis not present

## 2020-04-15 DIAGNOSIS — E1122 Type 2 diabetes mellitus with diabetic chronic kidney disease: Secondary | ICD-10-CM | POA: Diagnosis not present

## 2020-04-21 ENCOUNTER — Encounter: Payer: Medicare Other | Attending: General Surgery | Admitting: Skilled Nursing Facility1

## 2020-04-21 ENCOUNTER — Other Ambulatory Visit: Payer: Self-pay

## 2020-04-21 DIAGNOSIS — E669 Obesity, unspecified: Secondary | ICD-10-CM | POA: Insufficient documentation

## 2020-04-21 DIAGNOSIS — N1832 Chronic kidney disease, stage 3b: Secondary | ICD-10-CM

## 2020-04-21 DIAGNOSIS — E1169 Type 2 diabetes mellitus with other specified complication: Secondary | ICD-10-CM | POA: Insufficient documentation

## 2020-04-21 NOTE — Progress Notes (Signed)
Bariatric Follow-Up Visit Medical Nutrition Therapy  Appt Start Time: 8:50am  End Time: 9:11am   Primary Concerns Today: Bariatric Surgery Nutrition Follow Up   Bariatric Surgery Type: RYGB     Surgery Date: 05/01/2018   NUTRITION ASSESSMENT   Anthropometrics  Start weight at [facility]: 351.1 lbs  Today's weight: 268 pounds  Body Composition Scale 05/14/2019 06/24/2019 09/25/2019 10/24/2019  Weight 265.6 266.8 269.3 260  BMI 51 51.3 51.8 49.9  Total Body Fat % 49.4 49.5 49.8 49.1     Visceral Fat 22 22 23 22   Fat-Free Mass % 50.5 50.4 50.1 50.8     Total Body Water % 39.7 39.7 39.5 39.9     Muscle-Mass lbs 28.3 28.3 28.2 28.1  Body Fat Displacement             Torso  lbs 81.5 82 83.3 79.2         Left Leg  lbs 16.3 16.4 16.6 15.8         Right Leg  lbs 16.3 16.4 16.6 15.8         Left Arm  lbs 8.1 8.2 8.3 7.9         Right Arm   lbs 8.1 8.2 8.3 7.9   Pt states she make dip from sausage and refried beans and cream cheese: Dietitian suggested to limit foods that are high in saturated fat and salt.  Pt states she was eating lunchables but stopped due to the salt.  Pt states she has had some depressed eating so is interested in working with a talk therapist: Dietitian advised she call her insurance to see who is covered.   Focused on CKD for todays appt.   Medical dx: CKD stage 3, DM type 2, CHF, HTN  LAbs: potassium: (04/15/2020) 4.9; (03/25/2020) creatinine 2.06, GFR 31, phosphorus 4.5  24-Hr Dietary Recall First Meal: oatmeal or grits + boiled egg  Snack: cheese stick and sugar free jello and candy Second Meal 12:30: baked chicken + green beans or chicken salad Snack: cheese stick + ritz crackers + deli meat Third Meal: green , pinto beans , meat balls Snack: peanut butter cracker, jello banana Beverages: water with flavoring, tea (half sweetened and half non), sometimes diet soda   Estimated Daily Fluid Intake: 80+ ounces  Estimated Daily Protein Intake:  50-60  g Supplements: bariatric MVI, calcium  Current average weekly physical activity: walking around the house and neighborhood sometimes Wii or resistance bands, zumba 2 days a week    Signs/Symptoms  Using straws: no Drinking while eating: no Chewing/swallowing difficulties: no Changes in vision: no Changes to mood/headaches: no Hair loss/changes to skin/nails: no Difficulty focusing/concentrating: no Sweating: no Dizziness/lightheadedness: no Palpitations: no Carbonated/caffeinated beverages: no N/V/D/C/Gas: no Abdominal pain: no Dumping syndrome: no     NUTRITION DIAGNOSIS  Overweight/obesity ([place]-3.3) related to past poor dietary habits and physical inactivity as evidenced by patient w/ completed RYGB surgery following dietary guidelines for continued weight loss and healthy nutrition status.  Previous Handouts: Meal ideas with appropriate portion sizes    NUTRITION INTERVENTION Nutrition counseling (C-1) and education (E-2) to facilitate bariatric surgery goals, including:  The importance of consuming adequate calories as well as certain nutrients daily due to the body's need for essential vitamins, minerals, and fats  The importance of daily physical activity and to reach a goal of at least 150 minutes of moderate to vigorous physical activity weekly (or as directed by their physician) due to benefits such as increased  musculature and improved lab values  Goals: -Continue to avoid adding butter, sugar, and salt to foods -continue: Checking blood glucose- fasting and 2 hr after meal -Avoid eating deli meat completely -Avoid hot dogs completely -Only eat your sausage 3 times a week 1 patty per day -Limit bacon to 2 slices a WEEK -Try [employer] recipes  Handouts Provided Include  Mindful meals check list  Readiness for Change: Ready Demonstrated degree of understanding via: Teach Back     MONITORING & EVALUATION Dietary intake, weekly physical activity, and body  weight follow up in 3 months.  Patient is to return to [facility]

## 2020-04-27 DIAGNOSIS — E108 Type 1 diabetes mellitus with unspecified complications: Secondary | ICD-10-CM | POA: Diagnosis not present

## 2020-04-27 DIAGNOSIS — Z794 Long term (current) use of insulin: Secondary | ICD-10-CM | POA: Diagnosis not present

## 2020-05-01 DIAGNOSIS — H524 Presbyopia: Secondary | ICD-10-CM | POA: Diagnosis not present

## 2020-05-04 DIAGNOSIS — R69 Illness, unspecified: Secondary | ICD-10-CM | POA: Diagnosis not present

## 2020-05-04 DIAGNOSIS — Z9884 Bariatric surgery status: Secondary | ICD-10-CM | POA: Diagnosis not present

## 2020-05-04 DIAGNOSIS — K912 Postsurgical malabsorption, not elsewhere classified: Secondary | ICD-10-CM | POA: Diagnosis not present

## 2020-05-12 DIAGNOSIS — E039 Hypothyroidism, unspecified: Secondary | ICD-10-CM | POA: Diagnosis not present

## 2020-05-12 DIAGNOSIS — E1122 Type 2 diabetes mellitus with diabetic chronic kidney disease: Secondary | ICD-10-CM | POA: Diagnosis not present

## 2020-05-12 DIAGNOSIS — E1169 Type 2 diabetes mellitus with other specified complication: Secondary | ICD-10-CM | POA: Diagnosis not present

## 2020-05-12 DIAGNOSIS — I10 Essential (primary) hypertension: Secondary | ICD-10-CM | POA: Diagnosis not present

## 2020-05-12 DIAGNOSIS — N1832 Chronic kidney disease, stage 3b: Secondary | ICD-10-CM | POA: Diagnosis not present

## 2020-05-12 DIAGNOSIS — J45909 Unspecified asthma, uncomplicated: Secondary | ICD-10-CM | POA: Diagnosis not present

## 2020-05-12 DIAGNOSIS — K912 Postsurgical malabsorption, not elsewhere classified: Secondary | ICD-10-CM | POA: Diagnosis not present

## 2020-05-18 ENCOUNTER — Telehealth: Payer: Self-pay | Admitting: Family Medicine

## 2020-05-18 NOTE — Telephone Encounter (Signed)
   [name] DOB: 05-Dec-1965 MRN: [id number]   [name]  For purposes of improving physical access to our facilities, [name] is pleased to partner with third parties to provide [name] patients or other authorized individuals the option of convenient, on-demand ground transportation [name] (the [name]") through use of the technology service that enables users to request on-demand ground transportation from independent third-party providers.  By opting to use and accept these [name], I, the undersigned, hereby agree on behalf of myself, and on behalf of any minor child using the [name] for whom I am the parent or legal guardian, as follows:  1. [name] provided to me are provided by independent third-party transportation providers who are not [name] or employees and who are unaffiliated with [name]. 2. [name] is neither a transportation carrier nor a common or public carrier. 3. [name] has no control over the quality or safety of the transportation that occurs as a result of the [name]. 4. [name] cannot guarantee that any third-party transportation provider will complete any arranged transportation service. 5. [name] makes no representation, warranty, or guarantee regarding the reliability, timeliness, quality, safety, suitability, or availability of any of the [name] or that they will be error free. 6. I fully understand that traveling by vehicle involves risks and dangers of serious bodily injury, including permanent disability, paralysis, and death. I agree, on behalf of myself and on behalf of any minor child using the [name] for whom I am the parent or legal guardian, that the entire risk arising out of my use of the [name] remains solely with me, to the maximum extent permitted under applicable law. 7. The [name] are provided "as is" and "as available." [name] disclaims all representations and warranties, express, implied or statutory, not expressly set out in these terms, including the implied warranties of merchantability and fitness for a particular purpose. 8. I hereby waive and release [name], employees, officers, directors, representatives, insurers, attorneys, assigns, successors, subsidiaries, and affiliates from any and all past, present, or future claims, demands, liabilities, actions, causes of action, or suits of any kind directly or indirectly arising from acceptance and use of the [name]. 9. I further waive and release  and its affiliates from all present and future [name] and responsibility for any injury or death to persons or damages to property caused by or related to the use of the [name]. 10. I have read this Waiver and Release of [name], and I understand the terms used in it and their legal significance. This Waiver is freely and voluntarily given with the understanding that my right (as well as the right of any minor child for whom I am the parent or legal guardian using the [name]) to legal recourse against  in connection with the [name] is knowingly surrendered in return for use of these [name].   I attest that I read the consent document to [name], gave [name] the opportunity to ask questions and answered the questions asked (if any). I affirm that [name] then provided consent for she's participation in this program.     [name]

## 2020-05-26 DIAGNOSIS — E108 Type 1 diabetes mellitus with unspecified complications: Secondary | ICD-10-CM | POA: Diagnosis not present

## 2020-05-26 DIAGNOSIS — Z794 Long term (current) use of insulin: Secondary | ICD-10-CM | POA: Diagnosis not present

## 2020-06-09 DIAGNOSIS — E1122 Type 2 diabetes mellitus with diabetic chronic kidney disease: Secondary | ICD-10-CM | POA: Diagnosis not present

## 2020-06-09 DIAGNOSIS — I129 Hypertensive chronic kidney disease with stage 1 through stage 4 chronic kidney disease, or unspecified chronic kidney disease: Secondary | ICD-10-CM | POA: Diagnosis not present

## 2020-06-09 DIAGNOSIS — E211 Secondary hyperparathyroidism, not elsewhere classified: Secondary | ICD-10-CM | POA: Diagnosis not present

## 2020-06-09 DIAGNOSIS — E875 Hyperkalemia: Secondary | ICD-10-CM | POA: Diagnosis not present

## 2020-06-09 DIAGNOSIS — N189 Chronic kidney disease, unspecified: Secondary | ICD-10-CM | POA: Diagnosis not present

## 2020-06-11 ENCOUNTER — Other Ambulatory Visit: Payer: Self-pay | Admitting: Nephrology

## 2020-06-11 ENCOUNTER — Other Ambulatory Visit (HOSPITAL_COMMUNITY): Payer: Self-pay | Admitting: Nephrology

## 2020-06-11 DIAGNOSIS — R600 Localized edema: Secondary | ICD-10-CM

## 2020-06-11 DIAGNOSIS — E1129 Type 2 diabetes mellitus with other diabetic kidney complication: Secondary | ICD-10-CM | POA: Diagnosis not present

## 2020-06-11 DIAGNOSIS — I129 Hypertensive chronic kidney disease with stage 1 through stage 4 chronic kidney disease, or unspecified chronic kidney disease: Secondary | ICD-10-CM | POA: Diagnosis not present

## 2020-06-11 DIAGNOSIS — L110 Acquired keratosis follicularis: Secondary | ICD-10-CM | POA: Diagnosis not present

## 2020-06-11 DIAGNOSIS — N189 Chronic kidney disease, unspecified: Secondary | ICD-10-CM | POA: Diagnosis not present

## 2020-06-11 DIAGNOSIS — R809 Proteinuria, unspecified: Secondary | ICD-10-CM | POA: Diagnosis not present

## 2020-06-11 DIAGNOSIS — L609 Nail disorder, unspecified: Secondary | ICD-10-CM | POA: Diagnosis not present

## 2020-06-11 DIAGNOSIS — E1140 Type 2 diabetes mellitus with diabetic neuropathy, unspecified: Secondary | ICD-10-CM | POA: Diagnosis not present

## 2020-06-11 DIAGNOSIS — E1122 Type 2 diabetes mellitus with diabetic chronic kidney disease: Secondary | ICD-10-CM | POA: Diagnosis not present

## 2020-06-11 DIAGNOSIS — E211 Secondary hyperparathyroidism, not elsewhere classified: Secondary | ICD-10-CM | POA: Diagnosis not present

## 2020-06-23 ENCOUNTER — Ambulatory Visit (HOSPITAL_COMMUNITY)
Admission: RE | Admit: 2020-06-23 | Discharge: 2020-06-23 | Disposition: A | Discharge status: Home / Self Care | Payer: Medicare Other | Source: Ambulatory Visit | Attending: Nephrology | Admitting: Nephrology

## 2020-06-23 DIAGNOSIS — R600 Localized edema: Secondary | ICD-10-CM | POA: Insufficient documentation

## 2020-06-30 ENCOUNTER — Other Ambulatory Visit: Payer: Self-pay

## 2020-06-30 ENCOUNTER — Encounter: Payer: Medicare Other | Attending: General Surgery | Admitting: Skilled Nursing Facility1

## 2020-06-30 DIAGNOSIS — Z6841 Body Mass Index (BMI) 40.0 and over, adult: Secondary | ICD-10-CM | POA: Insufficient documentation

## 2020-06-30 DIAGNOSIS — E669 Obesity, unspecified: Secondary | ICD-10-CM | POA: Diagnosis present

## 2020-06-30 DIAGNOSIS — E1169 Type 2 diabetes mellitus with other specified complication: Secondary | ICD-10-CM | POA: Diagnosis not present

## 2020-06-30 DIAGNOSIS — N1832 Chronic kidney disease, stage 3b: Secondary | ICD-10-CM | POA: Diagnosis not present

## 2020-06-30 NOTE — Progress Notes (Signed)
Bariatric Follow-Up Visit Medical Nutrition Therapy  Appt Start Time: 8:50am  End Time: 9:11am   Primary Concerns Today: Bariatric Surgery Nutrition Follow Up   Bariatric Surgery Type: RYGB     Surgery Date: 05/01/2018   NUTRITION ASSESSMENT   Anthropometrics  Start weight at [facility]: 351.1 lbs  Today's weight: 269.3 pounds; 48.2% body fat, 40.3% TBW  Body Composition Scale 05/14/2019 06/24/2019 09/25/2019 10/24/2019  Weight 265.6 266.8 269.3 260  BMI 51 51.3 51.8 49.9  Total Body Fat % 49.4 49.5 49.8 49.1     Visceral Fat 22 22 23 22   Fat-Free Mass % 50.5 50.4 50.1 50.8     Total Body Water % 39.7 39.7 39.5 39.9     Muscle-Mass lbs 28.3 28.3 28.2 28.1  Body Fat Displacement             Torso  lbs 81.5 82 83.3 79.2         Left Leg  lbs 16.3 16.4 16.6 15.8         Right Leg  lbs 16.3 16.4 16.6 15.8         Left Arm  lbs 8.1 8.2 8.3 7.9         Right Arm   lbs 8.1 8.2 8.3 7.9   Pt states she make dip from sausage and refried beans and cream cheese: Dietitian suggested to limit foods that are high in saturated fat and salt.  Pt states she was eating lunchables but stopped due to the salt.  Pt states she has had some depressed eating so is interested in working with a talk therapist: Dietitian advised she call her insurance to see who is covered.   Focused on CKD for todays appt.   Pt states since l;ast visit she has reduced sausage to 2 times a week. Pt states she learned how to bake in the oven instead of frying. Pt states she tried beyond meat burgers stating she liked it and added some pepper/salt to it. Pt states if she does not eat all of her food she throws it away: Dietitian advised she can save it for the net day and pt states she did not think of that.  Pt states when she is making her meals she thinks: not too much salt, the portion eating off of a small paper plate, chewing well well, not drinking with meals. Pt states she baked a pound cake and did eat some of it.  Pt  states she stopped taking her multivitamin because she forgot which one she was taking.  Pt states she has avoided sandwich meat. Pt states her blood sugars have been about 98.   When asked where she feels she is at in her diet/lifestyle: pt states she  feels she is making progress but it is slow and really hard  Medical dx: CKD stage 3, DM type 2, CHF, HTN  LAbs: potassium: (04/15/2020) 4.9; (03/25/2020) creatinine 2.06, GFR 34, phosphorus 4.7  24-Hr Dietary Recall First Meal: oatmeal or grits + boiled egg  + sausage or protein shake Snack: cheese stick and sugar free jello and candy Second Meal 12:30: baked chicken + green beans or chicken salad or baked potato (sour cream + butter + cheese + onion) + beyond meat burger Snack: cheese stick + ritz crackers + deli meat Third Meal: green , pinto beans , meat balls Snack: peanut butter cracker, jello banana Beverages: water with flavoring, tea (half sweetened and half non), sometimes diet soda   Estimated  Daily Fluid Intake: 50+ ounces  Estimated Daily Protein Intake:  50-60 g Supplements: bariatric MVI (ran out), calcium, vitamin D Current average weekly physical activity: walking around the house and neighborhood sometimes Wii or resistance bands, zumba 2 days a week    Signs/Symptoms  Using straws: no Drinking while eating: no Chewing/swallowing difficulties: no Changes in vision: no Changes to mood/headaches: no Hair loss/changes to skin/nails: no Difficulty focusing/concentrating: no Sweating: no Dizziness/lightheadedness: no Palpitations: no Carbonated/caffeinated beverages: no N/V/D/C/Gas: no Abdominal pain: no Dumping syndrome: no     NUTRITION DIAGNOSIS  Overweight/obesity ([place]-3.3) related to past poor dietary habits and physical inactivity as evidenced by patient w/ completed RYGB surgery following dietary guidelines for continued weight loss and healthy nutrition status.  Previous Handouts: Meal ideas with  appropriate portion sizes    NUTRITION INTERVENTION Nutrition counseling (C-1) and education (E-2) to facilitate bariatric surgery goals, including:  The importance of consuming adequate calories as well as certain nutrients daily due to the body's need for essential vitamins, minerals, and fats  The importance of daily physical activity and to reach a goal of at least 150 minutes of moderate to vigorous physical activity weekly (or as directed by their physician) due to benefits such as increased musculature and improved lab values  Goals: -Do not eat any crackers -do not drink the protein shakes -do not eat biscuits at all -Avoid chocolate pudding do the sugar free jello instead -Limit all dairy to 1 half serving per day: sour cream, cheese, yogurt, chocolate pudding -Avoid adding sugar to anything -Take a picture of your water flavorings and seasonings and send to Dietitian    Handouts Provided Include  Mindful meals check list Eat this not that phosphorus list of foods   Readiness for Change: Ready Demonstrated degree of understanding via: Teach Back     MONITORING & EVALUATION Dietary intake, weekly physical activity, and body weight follow up in 1 month.  Patient is to return to [facility]

## 2020-07-01 DIAGNOSIS — Z794 Long term (current) use of insulin: Secondary | ICD-10-CM | POA: Diagnosis not present

## 2020-07-01 DIAGNOSIS — E108 Type 1 diabetes mellitus with unspecified complications: Secondary | ICD-10-CM | POA: Diagnosis not present

## 2020-07-06 DIAGNOSIS — Z794 Long term (current) use of insulin: Secondary | ICD-10-CM | POA: Diagnosis not present

## 2020-07-06 DIAGNOSIS — E108 Type 1 diabetes mellitus with unspecified complications: Secondary | ICD-10-CM | POA: Diagnosis not present

## 2020-08-01 DIAGNOSIS — Z794 Long term (current) use of insulin: Secondary | ICD-10-CM | POA: Diagnosis not present

## 2020-08-01 DIAGNOSIS — E108 Type 1 diabetes mellitus with unspecified complications: Secondary | ICD-10-CM | POA: Diagnosis not present

## 2020-08-10 ENCOUNTER — Other Ambulatory Visit: Payer: Self-pay

## 2020-08-10 ENCOUNTER — Encounter: Payer: Medicare Other | Attending: General Surgery | Admitting: Skilled Nursing Facility1

## 2020-08-10 DIAGNOSIS — E1169 Type 2 diabetes mellitus with other specified complication: Secondary | ICD-10-CM | POA: Insufficient documentation

## 2020-08-10 DIAGNOSIS — N1832 Chronic kidney disease, stage 3b: Secondary | ICD-10-CM | POA: Diagnosis not present

## 2020-08-10 DIAGNOSIS — Z6841 Body Mass Index (BMI) 40.0 and over, adult: Secondary | ICD-10-CM | POA: Diagnosis present

## 2020-08-10 DIAGNOSIS — E108 Type 1 diabetes mellitus with unspecified complications: Secondary | ICD-10-CM | POA: Diagnosis not present

## 2020-08-10 DIAGNOSIS — E669 Obesity, unspecified: Secondary | ICD-10-CM | POA: Diagnosis present

## 2020-08-10 DIAGNOSIS — Z794 Long term (current) use of insulin: Secondary | ICD-10-CM | POA: Diagnosis not present

## 2020-08-10 NOTE — Progress Notes (Signed)
Bariatric Follow-Up Visit Medical Nutrition Therapy   Primary Concerns Today: Bariatric Surgery Nutrition Follow Up   Bariatric Surgery Type: RYGB     Surgery Date: 05/01/2018   NUTRITION ASSESSMENT   Anthropometrics  Start weight at [facility]: 351.1 lbs  Today's weight: 267.7 pounds   Body Composition Scale 06/24/2019 09/25/2019 10/24/2019 08/10/2020  Weight 266.8 269.3 260 267.7  BMI 51.3 51.8 49.9 51.4  Total Body Fat % 49.5 49.8 49.1 49.8     Visceral Fat 22 23 22 23   Fat-Free Mass % 50.4 50.1 50.8 50.1     Total Body Water % 39.7 39.5 39.9 39.5     Muscle-Mass lbs 28.3 28.2 28.1 28.1  Body Fat Displacement             Torso  lbs 82 83.3 79.2 82.7         Left Leg  lbs 16.4 16.6 15.8 16.5         Right Leg  lbs 16.4 16.6 15.8 16.5         Left Arm  lbs 8.2 8.3 7.9 8.2         Right Arm   lbs 8.2 8.3 7.9 8.2    Pt arrives with her drink additives to show. Pt states she really does not drink plain water.   Pt states she has the dexcom now stating her blood sugars have been about 90. Pt states after eating sherbert her blood sugars went to 125 so now she is realizing she needs to measure out her sherbert and not use a cone.  Pt states he is thinking she is going to start BELT: Dietitian gave pt the flyer  Medical dx: CKD stage 3, DM type 2, CHF, HTN  LAbs: potassium: (04/15/2020) 4.9; (03/25/2020) creatinine 2.06, GFR 34, phosphorus 4.7  24-Hr Dietary Recall First Meal: oatmeal + raisins or bacon Snack: cheese stick and sugar free jello and candy Second Meal 12:30: grapes Snack: cheese stick  Third Meal: cubed steak + green beans + rice + gravy Snack: peanut butter cracker, jello banana Beverages: water with flavoring (flavoring does have potassium and phosphorus), tea (half sweetened and half non), sometimes diet soda   Estimated Daily Fluid Intake: 50+ ounces  Estimated Daily Protein Intake:  50-60 g Supplements: bariatric MVI (ran out), calcium, vitamin D Current  average weekly physical activity: walking around the house and neighborhood sometimes Wii or resistance bands, zumba 2 days a week    Signs/Symptoms  Using straws: no Drinking while eating: no Chewing/swallowing difficulties: no Changes in vision: no Changes to mood/headaches: no Hair loss/changes to skin/nails: no Difficulty focusing/concentrating: no Sweating: no Dizziness/lightheadedness: no Palpitations: no Carbonated/caffeinated beverages: no N/V/D/C/Gas: no Abdominal pain: no Dumping syndrome: no     NUTRITION DIAGNOSIS  Overweight/obesity ([place]-3.3) related to past poor dietary habits and physical inactivity as evidenced by patient w/ completed RYGB surgery following dietary guidelines for continued weight loss and healthy nutrition status within the context of CKD and DM  Previous Handouts: Meal ideas with appropriate portion sizes    NUTRITION INTERVENTION Nutrition counseling (C-1) and education (E-2) to facilitate bariatric surgery goals, including: The importance of consuming adequate calories as well as certain nutrients daily due to the body's need for essential vitamins, minerals, and fats The importance of daily physical activity and to reach a goal of at least 150 minutes of moderate to vigorous physical activity weekly (or as directed by their physician) due to benefits such as increased musculature and improved  lab values Encouraged patient to honor their body's internal hunger and fullness cues.  Throughout the day, check in mentally and rate hunger. Stop eating when satisfied not full regardless of how much food is left on the plate.  Get more if still hungry 20-30 minutes later.  The key is to honor satisfaction so throughout the meal, rate fullness factor and stop when comfortably satisfied not physically full. The key is to honor hunger and fullness without any feelings of guilt or shame.  Pay attention to what the internal cues are, rather than any external  factors. This will enhance the confidence you have in listening to your own body and following those internal cues enabling you to increase how often you eat when you are hungry not out of appetite and stop when you are satisfied not full.  Encouraged pt to continue to eat balanced meals inclusive of non starchy vegetables 2 times a day 7 days a week Encouraged pt to choose lean protein sources: limiting beef, pork, sausage, hotdogs, and lunch meat Encourage pt to choose healthy fats such as plant based limiting animal fats Encouraged pt to continue to drink a minium 64 fluid ounces with half being plain water to satisfy proper hydration   Goals:  -Stop adding flavor packets to your water -Hint water is okay -Try Stur to add to your water for flavor  -Measure your sherbet out to 1/2 cup and do not use a cone -Find a mental health professional call the number on the back of your insurance card -Try brown rice  -use 1 bullion cube per dish  -Measure out a tsp (teaspoon) of raisins    Handouts Previously Provided Include  Mindful meals check list Eat this not that phosphorus list of foods   Readiness for Change: contemplating  Demonstrated degree of understanding via: Teach Back     MONITORING & EVALUATION Dietary intake, weekly physical activity, and body weight follow up in 6 weeks  Patient is to return to [facility]

## 2020-08-11 DIAGNOSIS — I129 Hypertensive chronic kidney disease with stage 1 through stage 4 chronic kidney disease, or unspecified chronic kidney disease: Secondary | ICD-10-CM | POA: Diagnosis not present

## 2020-08-11 DIAGNOSIS — E1122 Type 2 diabetes mellitus with diabetic chronic kidney disease: Secondary | ICD-10-CM | POA: Diagnosis not present

## 2020-08-11 DIAGNOSIS — N189 Chronic kidney disease, unspecified: Secondary | ICD-10-CM | POA: Diagnosis not present

## 2020-08-11 DIAGNOSIS — R809 Proteinuria, unspecified: Secondary | ICD-10-CM | POA: Diagnosis not present

## 2020-08-11 DIAGNOSIS — E1129 Type 2 diabetes mellitus with other diabetic kidney complication: Secondary | ICD-10-CM | POA: Diagnosis not present

## 2020-08-18 DIAGNOSIS — E785 Hyperlipidemia, unspecified: Secondary | ICD-10-CM | POA: Diagnosis not present

## 2020-08-18 DIAGNOSIS — E039 Hypothyroidism, unspecified: Secondary | ICD-10-CM | POA: Diagnosis not present

## 2020-08-18 DIAGNOSIS — N183 Chronic kidney disease, stage 3 unspecified: Secondary | ICD-10-CM | POA: Diagnosis not present

## 2020-08-18 DIAGNOSIS — N1832 Chronic kidney disease, stage 3b: Secondary | ICD-10-CM | POA: Diagnosis not present

## 2020-08-18 DIAGNOSIS — E1169 Type 2 diabetes mellitus with other specified complication: Secondary | ICD-10-CM | POA: Diagnosis not present

## 2020-08-18 DIAGNOSIS — S81802A Unspecified open wound, left lower leg, initial encounter: Secondary | ICD-10-CM | POA: Diagnosis not present

## 2020-08-18 DIAGNOSIS — I10 Essential (primary) hypertension: Secondary | ICD-10-CM | POA: Diagnosis not present

## 2020-08-18 DIAGNOSIS — J45909 Unspecified asthma, uncomplicated: Secondary | ICD-10-CM | POA: Diagnosis not present

## 2020-08-18 DIAGNOSIS — I129 Hypertensive chronic kidney disease with stage 1 through stage 4 chronic kidney disease, or unspecified chronic kidney disease: Secondary | ICD-10-CM | POA: Diagnosis not present

## 2020-08-18 DIAGNOSIS — E1122 Type 2 diabetes mellitus with diabetic chronic kidney disease: Secondary | ICD-10-CM | POA: Diagnosis not present

## 2020-08-25 ENCOUNTER — Encounter: Payer: Self-pay | Admitting: Internal Medicine

## 2020-08-25 DIAGNOSIS — R809 Proteinuria, unspecified: Secondary | ICD-10-CM | POA: Diagnosis not present

## 2020-08-25 DIAGNOSIS — D631 Anemia in chronic kidney disease: Secondary | ICD-10-CM | POA: Diagnosis not present

## 2020-08-25 DIAGNOSIS — I129 Hypertensive chronic kidney disease with stage 1 through stage 4 chronic kidney disease, or unspecified chronic kidney disease: Secondary | ICD-10-CM | POA: Diagnosis not present

## 2020-08-25 DIAGNOSIS — E1122 Type 2 diabetes mellitus with diabetic chronic kidney disease: Secondary | ICD-10-CM | POA: Diagnosis not present

## 2020-08-25 DIAGNOSIS — E1129 Type 2 diabetes mellitus with other diabetic kidney complication: Secondary | ICD-10-CM | POA: Diagnosis not present

## 2020-08-25 DIAGNOSIS — N189 Chronic kidney disease, unspecified: Secondary | ICD-10-CM | POA: Diagnosis not present

## 2020-08-25 DIAGNOSIS — E211 Secondary hyperparathyroidism, not elsewhere classified: Secondary | ICD-10-CM | POA: Diagnosis not present

## 2020-08-27 DIAGNOSIS — E1122 Type 2 diabetes mellitus with diabetic chronic kidney disease: Secondary | ICD-10-CM | POA: Diagnosis not present

## 2020-08-27 DIAGNOSIS — I129 Hypertensive chronic kidney disease with stage 1 through stage 4 chronic kidney disease, or unspecified chronic kidney disease: Secondary | ICD-10-CM | POA: Diagnosis not present

## 2020-08-27 DIAGNOSIS — E039 Hypothyroidism, unspecified: Secondary | ICD-10-CM | POA: Diagnosis not present

## 2020-08-27 DIAGNOSIS — N1832 Chronic kidney disease, stage 3b: Secondary | ICD-10-CM | POA: Diagnosis not present

## 2020-08-31 DIAGNOSIS — E108 Type 1 diabetes mellitus with unspecified complications: Secondary | ICD-10-CM | POA: Diagnosis not present

## 2020-08-31 DIAGNOSIS — Z794 Long term (current) use of insulin: Secondary | ICD-10-CM | POA: Diagnosis not present

## 2020-09-09 DIAGNOSIS — E108 Type 1 diabetes mellitus with unspecified complications: Secondary | ICD-10-CM | POA: Diagnosis not present

## 2020-09-09 DIAGNOSIS — Z794 Long term (current) use of insulin: Secondary | ICD-10-CM | POA: Diagnosis not present

## 2020-09-10 DIAGNOSIS — E1140 Type 2 diabetes mellitus with diabetic neuropathy, unspecified: Secondary | ICD-10-CM | POA: Diagnosis not present

## 2020-09-10 DIAGNOSIS — L110 Acquired keratosis follicularis: Secondary | ICD-10-CM | POA: Diagnosis not present

## 2020-09-10 DIAGNOSIS — L609 Nail disorder, unspecified: Secondary | ICD-10-CM | POA: Diagnosis not present

## 2020-09-22 ENCOUNTER — Encounter: Payer: Medicare Other | Attending: General Surgery | Admitting: Skilled Nursing Facility1

## 2020-09-22 ENCOUNTER — Other Ambulatory Visit: Payer: Self-pay

## 2020-09-22 DIAGNOSIS — E669 Obesity, unspecified: Secondary | ICD-10-CM | POA: Insufficient documentation

## 2020-09-22 DIAGNOSIS — Z6841 Body Mass Index (BMI) 40.0 and over, adult: Secondary | ICD-10-CM

## 2020-09-22 DIAGNOSIS — E1169 Type 2 diabetes mellitus with other specified complication: Secondary | ICD-10-CM | POA: Insufficient documentation

## 2020-09-22 DIAGNOSIS — E1122 Type 2 diabetes mellitus with diabetic chronic kidney disease: Secondary | ICD-10-CM | POA: Diagnosis not present

## 2020-09-22 DIAGNOSIS — I129 Hypertensive chronic kidney disease with stage 1 through stage 4 chronic kidney disease, or unspecified chronic kidney disease: Secondary | ICD-10-CM | POA: Diagnosis not present

## 2020-09-22 DIAGNOSIS — M1830 Unilateral post-traumatic osteoarthritis of first carpometacarpal joint, unspecified hand: Secondary | ICD-10-CM | POA: Diagnosis not present

## 2020-09-22 DIAGNOSIS — S81802A Unspecified open wound, left lower leg, initial encounter: Secondary | ICD-10-CM | POA: Diagnosis not present

## 2020-09-22 DIAGNOSIS — N1832 Chronic kidney disease, stage 3b: Secondary | ICD-10-CM

## 2020-09-22 DIAGNOSIS — E119 Type 2 diabetes mellitus without complications: Secondary | ICD-10-CM

## 2020-09-22 DIAGNOSIS — N183 Chronic kidney disease, stage 3 unspecified: Secondary | ICD-10-CM | POA: Diagnosis not present

## 2020-09-22 NOTE — Progress Notes (Signed)
Bariatric Follow-Up Visit Medical Nutrition Therapy   Primary Concerns Today: Bariatric Surgery Nutrition Follow Up   Bariatric Surgery Type: RYGB     Surgery Date: 05/01/2018   NUTRITION ASSESSMENT   Anthropometrics  Start weight at [facility]: 351.1 lbs  Today's weight: 265.5 pounds   Body Composition Scale 09/25/2019 10/24/2019 08/10/2020 09/22/2020  Weight 269.3 260 267.7 265.5  BMI 51.8 49.9 51.4 51  Total Body Fat % 49.8 49.1 49.8 49.6     Visceral Fat 23 22 23 23   Fat-Free Mass % 50.1 50.8 50.1 50.3     Total Body Water % 39.5 39.9 39.5 39.6     Muscle-Mass lbs 28.2 28.1 28.1 28  Body Fat Displacement             Torso  lbs 83.3 79.2 82.7 81.7         Left Leg  lbs 16.6 15.8 16.5 16.3         Right Leg  lbs 16.6 15.8 16.5 16.3         Left Arm  lbs 8.3 7.9 8.2 8.1         Right Arm   lbs 8.3 7.9 8.2 8.1    Pt to follow up with [facility] for hypoglycemic episodes.   Pt states she really likes coffee but cannot drink it without a lot of creamer stating she will try sugar free syrup: Dietitian advised due to the potassium in that product be sure to only use it once a day and limit to the serving size.  Pt states she did stop drinking the flavorings with potassium/phosphorus.   Pt states she is excited to get a dress for her high school reunion and is excited to wear a tight dress.  Pt states she wants to try impossible burgers on the grill.   Pt recently prescribed calcitriol Monday, Wednesday, Friday: Physician is aware of her 1500mg  calcium per day.   Pt states her sugar drops around 6am to about 49.   Medical dx: CKD stage 3, DM type 2, CHF, HTN  LAbs: potassium: 4.7; creatinine 2.2, GFR 26, phosphorus 4.5, PTH 205, hemoglobin 10.7  Medications: insulin pump, continuous glucose monitor   24-Hr Dietary Recall First Meal: oatmeal + raisins or bacon or grits + bacon or 2 boiled eggs Snack: cheese stick and sugar free jello and candy Second Meal 12:30: BBQ ribs   Snack: cheese stick  Third Meal: cubed steak + green beans + rice + gravy or ribs + cabbage + green beans + butter Snack: cherries Beverages: water with flavoring,  tea (half sweetened and half non), sometimes diet soda   Estimated Daily Fluid Intake: 50+ ounces  Estimated Daily Protein Intake:  50-60 g Supplements: bariatric MVI (ran out), calcium, vitamin D Current average weekly physical activity: walking around the house and neighborhood sometimes Wii or resistance bands, zumba 2 days a week, virtual reality game    Signs/Symptoms  Using straws: no Drinking while eating: no Chewing/swallowing difficulties: no Changes in vision: no Changes to mood/headaches: no Hair loss/changes to skin/nails: no Difficulty focusing/concentrating: no Sweating: no Dizziness/lightheadedness: no Palpitations: no Carbonated/caffeinated beverages: no N/V/D/C/Gas: no Abdominal pain: no Dumping syndrome: no     NUTRITION DIAGNOSIS  Overweight/obesity ([place]-3.3) related to past poor dietary habits and physical inactivity as evidenced by patient w/ completed RYGB surgery following dietary guidelines for continued weight loss and healthy nutrition status within the context of CKD and DM  Previous Handouts: Meal ideas with  appropriate portion sizes    NUTRITION INTERVENTION Nutrition counseling (C-1) and education (E-2) to facilitate bariatric surgery goals, including: The importance of consuming adequate calories as well as certain nutrients daily due to the body's need for essential vitamins, minerals, and fats The importance of daily physical activity and to reach a goal of at least 150 minutes of moderate to vigorous physical activity weekly (or as directed by their physician) due to benefits such as increased musculature and improved lab values Encouraged patient to honor their body's internal hunger and fullness cues.  Throughout the day, check in mentally and rate hunger. Stop eating when  satisfied not full regardless of how much food is left on the plate.  Get more if still hungry 20-30 minutes later.  The key is to honor satisfaction so throughout the meal, rate fullness factor and stop when comfortably satisfied not physically full. The key is to honor hunger and fullness without any feelings of guilt or shame.  Pay attention to what the internal cues are, rather than any external factors. This will enhance the confidence you have in listening to your own body and following those internal cues enabling you to increase how often you eat when you are hungry not out of appetite and stop when you are satisfied not full.  Encouraged pt to continue to eat balanced meals inclusive of non starchy vegetables 2 times a day 7 days a week Encouraged pt to choose lean protein sources: limiting beef, pork, sausage, hotdogs, and lunch meat Encourage pt to choose healthy fats such as plant based limiting animal fats Encouraged pt to continue to drink a minium 64 fluid ounces with half being plain water to satisfy proper hydration  Educated on hypoglycemia correction  Hyperphosphoremia and its consequences    Goals: continue  -ensure you are only eating meat the size of your palm (phosphorus is high) -Stop adding flavor packets to your water -Hint water is okay -Try Stur to add to your water for flavor  -Measure your sherbet out to 1/2 cup and do not use a cone -Find a mental health professional call the number on the back of your insurance card -Try brown rice  -use 1 bullion cube per dish  -Measure out a tsp (teaspoon) of raisins   Handouts Previously Provided Include  Mindful meals check list Eat this not that phosphorus list of foods   Readiness for Change: contemplating  Demonstrated degree of understanding via: Teach Back     MONITORING & EVALUATION Dietary intake, weekly physical activity, and body weight follow up   Patient is to return to [facility] as soon as [facility] is available

## 2020-09-30 DIAGNOSIS — E108 Type 1 diabetes mellitus with unspecified complications: Secondary | ICD-10-CM | POA: Diagnosis not present

## 2020-09-30 DIAGNOSIS — Z794 Long term (current) use of insulin: Secondary | ICD-10-CM | POA: Diagnosis not present

## 2020-10-01 DIAGNOSIS — E162 Hypoglycemia, unspecified: Secondary | ICD-10-CM | POA: Diagnosis not present

## 2020-10-01 DIAGNOSIS — R531 Weakness: Secondary | ICD-10-CM | POA: Diagnosis not present

## 2020-10-05 ENCOUNTER — Encounter: Payer: Medicare Other | Attending: General Surgery | Admitting: Nutrition

## 2020-10-05 ENCOUNTER — Other Ambulatory Visit: Payer: Self-pay

## 2020-10-05 DIAGNOSIS — E11649 Type 2 diabetes mellitus with hypoglycemia without coma: Secondary | ICD-10-CM | POA: Diagnosis not present

## 2020-10-06 NOTE — Patient Instructions (Signed)
Read over handouts given on symptoms and treatments of low blood sugar.

## 2020-10-06 NOTE — Progress Notes (Signed)
Patient is here today to review how to treat low blood sugars.  She lives with mother.  Her children live outside of her home, but can get there within 15 minutes if needed.   She wears a [employer] pump:  Basal rate: [place]: 0.425, 6:30AM; 0.50, IC; 15, ISF: 60, target 100-150.  Timing 3 hours.  She wears a Dexcom CGM. Pt. Reported that on Sunday, blood sugar via her CGM sensor dropped to 69.  She did not verify this with a fingerstick, but says felt "funny".  She immediately took the G-voke (glucagon).   Her  blood sugar rose to 120.  She then ate 2 eggs with grits in 1 hour on her sensor reading dropped again to 69.and Her mother fixed her a peanut butter sandwich with "lots of jelly.  Pt. Reports treating lows (2-3X/wk) at different times of day/night, and treating these with varying foods like nabs, soda, candy bars,  Reports that blood sugar dropped again last Wednesday at [place] to 45.  She felt very nauseated and was given "sweets" by her mother.  They then called EMS for this.  EMS then gave her juice with sugar.    Discussed difference between CGM readings and blood sugar readings and the appropriate treatment of low blood sugars.  ALso, the fact that 15 grams of fast acting carbs will raise her blood sugar 60 porints in 15 min.  List of samples of what 15 grams are were given to her.   4 Glucose tablets or one tube of glucose gel, wait 15 min., and retest blood sugar.  Handout given for this with other treatment options. Discussed need to not give anything with fat or fiber (penatu butter, chocolate etc.) and other foods with this in them, as they slow blood sugar rise and cause over treatment of low blood sugars.   Also showed her on her pump to see if she has IOB, and what this means.  If there is IOB, she will give 15 more grams of glucose for every unit of Insulin On board.  She reported good understanding of this. Stressed need for eating protein with each meal, prevent low blood sugars 2-4 hours  after bolusing.  She reported good understanding of this and agreed to do this.  Suggestions given for protein at breakfast and lunch.   Also discussed the need to not take Gvoke.  That this is used if patient can not take anything by mouth.  She reported good understanding of ths and had no final questions.

## 2020-10-12 DIAGNOSIS — Z794 Long term (current) use of insulin: Secondary | ICD-10-CM | POA: Diagnosis not present

## 2020-10-12 DIAGNOSIS — E108 Type 1 diabetes mellitus with unspecified complications: Secondary | ICD-10-CM | POA: Diagnosis not present

## 2020-10-20 ENCOUNTER — Other Ambulatory Visit: Payer: Self-pay

## 2020-10-20 ENCOUNTER — Encounter: Payer: Medicare Other | Attending: Physician Assistant | Admitting: Physician Assistant

## 2020-10-20 DIAGNOSIS — E11622 Type 2 diabetes mellitus with other skin ulcer: Secondary | ICD-10-CM | POA: Diagnosis not present

## 2020-10-20 DIAGNOSIS — I890 Lymphedema, not elsewhere classified: Secondary | ICD-10-CM | POA: Diagnosis not present

## 2020-10-20 DIAGNOSIS — Z87891 Personal history of nicotine dependence: Secondary | ICD-10-CM | POA: Insufficient documentation

## 2020-10-20 DIAGNOSIS — I130 Hypertensive heart and chronic kidney disease with heart failure and stage 1 through stage 4 chronic kidney disease, or unspecified chronic kidney disease: Secondary | ICD-10-CM | POA: Diagnosis not present

## 2020-10-20 DIAGNOSIS — I5042 Chronic combined systolic (congestive) and diastolic (congestive) heart failure: Secondary | ICD-10-CM | POA: Diagnosis not present

## 2020-10-20 DIAGNOSIS — I872 Venous insufficiency (chronic) (peripheral): Secondary | ICD-10-CM | POA: Insufficient documentation

## 2020-10-20 DIAGNOSIS — Z794 Long term (current) use of insulin: Secondary | ICD-10-CM | POA: Diagnosis not present

## 2020-10-20 DIAGNOSIS — N183 Chronic kidney disease, stage 3 unspecified: Secondary | ICD-10-CM | POA: Diagnosis not present

## 2020-10-20 DIAGNOSIS — I129 Hypertensive chronic kidney disease with stage 1 through stage 4 chronic kidney disease, or unspecified chronic kidney disease: Secondary | ICD-10-CM | POA: Diagnosis not present

## 2020-10-20 DIAGNOSIS — E669 Obesity, unspecified: Secondary | ICD-10-CM | POA: Insufficient documentation

## 2020-10-20 DIAGNOSIS — L97829 Non-pressure chronic ulcer of other part of left lower leg with unspecified severity: Secondary | ICD-10-CM | POA: Diagnosis present

## 2020-10-20 DIAGNOSIS — L97822 Non-pressure chronic ulcer of other part of left lower leg with fat layer exposed: Secondary | ICD-10-CM | POA: Diagnosis not present

## 2020-10-20 DIAGNOSIS — E1122 Type 2 diabetes mellitus with diabetic chronic kidney disease: Secondary | ICD-10-CM | POA: Diagnosis not present

## 2020-10-20 DIAGNOSIS — Z9884 Bariatric surgery status: Secondary | ICD-10-CM | POA: Diagnosis not present

## 2020-10-20 DIAGNOSIS — S81802D Unspecified open wound, left lower leg, subsequent encounter: Secondary | ICD-10-CM | POA: Diagnosis not present

## 2020-10-21 NOTE — Progress Notes (Signed)
[name], [name] ([id number]) Visit Report for 10/20/2020 Allergy List Details Patient Name: [name], [name] Date of Service: 10/20/2020 8:45 AM Medical Record Number: [id number] Patient Account Number: [id number] Date of Birth/Sex: [birth year] (55 y.o. F) Treating RN: Carlene Coria Primary Care Louiza Moor: Iona Beard Other Clinician: Referring Travis Purk: Iona Beard Treating Duaa Stelzner/Extender: Jeri Cos Weeks in Treatment: 0 Allergies Active Allergies No Known Allergies Allergy Notes Electronic Signature(s) Signed: 10/21/2020 9:51:49 AM By: Carlene Coria RN Entered By: Carlene Coria on 10/20/2020 09:15:18 [name], [name] ([id number]) -------------------------------------------------------------------------------- Arrival Information Details Patient Name: [name] Date of Service: 10/20/2020 8:45 AM Medical Record Number: [id number] Patient Account Number: [id number] Date of Birth/Sex: 04-06-1965 (55 y.o. F) Treating RN: Carlene Coria Primary Care Ottis Vacha: Iona Beard Other Clinician: Referring Peta Peachey: Iona Beard Treating Davis Ambrosini/Extender: Skipper Cliche in Treatment: 0 Visit Information Patient Arrived: Ambulatory Arrival Time: 08:56 Accompanied By: self Transfer Assistance: None Patient Identification Verified: Yes Secondary Verification Process Completed: Yes Patient Requires Transmission-Based Precautions: No Patient Has Alerts: No Electronic Signature(s) Signed: 10/21/2020 9:51:49 AM By: Carlene Coria RN Entered By: Carlene Coria on 10/20/2020 09:07:18 [name], [name] ([id number]) -------------------------------------------------------------------------------- Clinic Level of Care Assessment Details Patient Name: [name] Date of Service: 10/20/2020 8:45 AM Medical Record Number: [id number] Patient Account Number: [id number] Date of Birth/Sex: 1965-06-06 (55 y.o. F) Treating RN: Carlene Coria Primary Care Denney Shein:  Iona Beard Other Clinician: Referring Derya Dettmann: Iona Beard Treating Damel Querry/Extender: Skipper Cliche in Treatment: 0 Clinic Level of Care Assessment Items TOOL 1 Quantity Score []  - Use when EandM and Procedure is performed on INITIAL visit 0 ASSESSMENTS - Nursing Assessment / Reassessment X - General Physical Exam (combine w/ comprehensive assessment (listed just below) when performed on new 1 20 pt. evals) X- 1 25 Comprehensive Assessment (HX, ROS, Risk Assessments, Wounds Hx, etc.) ASSESSMENTS - Wound and Skin Assessment / Reassessment []  - Dermatologic / Skin Assessment (not related to wound area) 0 ASSESSMENTS - Ostomy and/or Continence Assessment and Care []  - Incontinence Assessment and Management 0 []  - 0 Ostomy Care Assessment and Management (repouching, etc.) PROCESS - Coordination of Care X - Simple Patient / Family Education for ongoing care 1 15 []  - 0 Complex (extensive) Patient / Family Education for ongoing care X- 1 10 Staff obtains Programmer, systems, Records, Test Results / Process Orders []  - 0 Staff telephones HHA, Nursing Homes / Clarify orders / etc []  - 0 Routine Transfer to another Facility (non-emergent condition) []  - 0 Routine Hospital Admission (non-emergent condition) X- 1 15 New Admissions / Biomedical engineer / Ordering NPWT, Apligraf, etc. []  - 0 Emergency Hospital Admission (emergent condition) PROCESS - Special Needs []  - Pediatric / Minor Patient Management 0 []  - 0 Isolation Patient Management []  - 0 Hearing / Language / Visual special needs []  - 0 Assessment of Community assistance (transportation, D/C planning, etc.) []  - 0 Additional assistance / Altered mentation []  - 0 Support Surface(s) Assessment (bed, cushion, seat, etc.) INTERVENTIONS - Miscellaneous []  - External ear exam 0 []  - 0 Patient Transfer (multiple staff / Civil Service fast streamer / Similar devices) []  - 0 Simple Staple / Suture removal (25 or less) []  - 0 Complex  Staple / Suture removal (26 or more) []  - 0 Hypo/Hyperglycemic Management (do not check if billed separately) X- 1 15 Ankle / Brachial Index (ABI) - do not check if billed separately Has the patient been seen at the hospital within the last three years: Yes Total Score: 100 Level Of Care: New/Established -  Level 3 [name], Lori R. ([id number]) Electronic Signature(s) Signed: 10/21/2020 9:51:49 AM By: Carlene Coria RN Entered By: Carlene Coria on 10/20/2020 10:02:41 [name], [name] ([id number]) -------------------------------------------------------------------------------- Compression Therapy Details Patient Name: [name] Date of Service: 10/20/2020 8:45 AM Medical Record Number: [id number] Patient Account Number: [id number] Date of Birth/Sex: February 26, 1966 (55 y.o. F) Treating RN: Carlene Coria Primary Care Ahni Bradwell: Iona Beard Other Clinician: Referring Deangela Randleman: Iona Beard Treating Ionna Avis/Extender: Skipper Cliche in Treatment: 0 Compression Therapy Performed for Wound Assessment: Wound #1 Left,Medial Lower Leg Performed By: Clinician Carlene Coria, RN Compression Type: Four Layer Post Procedure Diagnosis Same as Pre-procedure Electronic Signature(s) Signed: 10/21/2020 9:51:49 AM By: Carlene Coria RN Entered By: Carlene Coria on 10/20/2020 09:50:56 [name], [name] ([id number]) -------------------------------------------------------------------------------- Encounter Discharge Information Details Patient Name: [name] Date of Service: 10/20/2020 8:45 AM Medical Record Number: [id number] Patient Account Number: [id number] Date of Birth/Sex: June 22, 1965 (55 y.o. F) Treating RN: Carlene Coria Primary Care Cher Franzoni: Iona Beard Other Clinician: Referring Mani Celestin: Iona Beard Treating Rodneisha Bonnet/Extender: Skipper Cliche in Treatment: 0 Encounter Discharge Information Items Discharge Condition: Stable Ambulatory Status:  Ambulatory Discharge Destination: Home Transportation: Private Auto Accompanied By: self Schedule Follow-up Appointment: Yes Clinical Summary of Care: Patient Declined Electronic Signature(s) Signed: 10/21/2020 9:51:49 AM By: Carlene Coria RN Entered By: Carlene Coria on 10/20/2020 10:03:20 [name], [name] ([id number]) -------------------------------------------------------------------------------- Lower Extremity Assessment Details Patient Name: [name] Date of Service: 10/20/2020 8:45 AM Medical Record Number: [id number] Patient Account Number: [id number] Date of Birth/Sex: 05/12/1965 (55 y.o. F) Treating RN: Carlene Coria Primary Care Heriberto Stmartin: Iona Beard Other Clinician: Referring Verdie Wilms: Iona Beard Treating Yosiel Thieme/Extender: Skipper Cliche in Treatment: 0 Edema Assessment Assessed: [Left: No] [Right: No] Edema: [Left: Ye] [Right: s] Calf Left: Right: Point of Measurement: 33 cm From Medial Instep 53 cm Ankle Left: Right: Point of Measurement: 10 cm From Medial Instep 33 cm Knee To Floor Left: Right: From Medial Instep 43 cm Vascular Assessment Pulses: Dorsalis Pedis Palpable: [Left:Yes] Blood Pressure: Brachial: [Left:138] Ankle: [Left:Dorsalis Pedis: 160 1.16] Electronic Signature(s) Signed: 10/21/2020 9:51:49 AM By: Carlene Coria RN Entered By: Carlene Coria on 10/20/2020 09:15:08 [name], [name] ([id number]) -------------------------------------------------------------------------------- Multi Wound Chart Details Patient Name: [name] Date of Service: 10/20/2020 8:45 AM Medical Record Number: [id number] Patient Account Number: [id number] Date of Birth/Sex: Dec 21, 1965 (54 y.o. F) Treating RN: Carlene Coria Primary Care Blaize Nipper: Iona Beard Other Clinician: Referring Skyann Ganim: Iona Beard Treating Jalei Shibley/Extender: Skipper Cliche in Treatment: 0 Vital Signs Height(in): 60 Pulse(bpm): 51 Weight(lbs):  264 Blood Pressure(mmHg): 138/79 Body Mass Index(BMI): 52 Temperature(F): 98.2 Respiratory Rate(breaths/min): 18 Photos: [N/A:N/A] Wound Location: Left, Medial Lower Leg N/A N/A Wounding Event: Gradually Appeared N/A N/A Primary Etiology: Diabetic Wound/Ulcer of the Lower N/A N/A Extremity Comorbid History: Congestive Heart Failure, N/A N/A Hypertension, Type II Diabetes Date Acquired: 09/28/2020 N/A N/A Weeks of Treatment: 0 N/A N/A Wound Status: Open N/A N/A Measurements L x W x D (cm) 1.5x2.2x0.1 N/A N/A Area (cm) : 2.592 N/A N/A Volume (cm) : 0.259 N/A N/A Classification: Grade 2 N/A N/A Exudate Amount: Medium N/A N/A Exudate Type: Serosanguineous N/A N/A Exudate Color: red, brown N/A N/A Granulation Amount: Medium (34-66%) N/A N/A Granulation Quality: Red N/A N/A Necrotic Amount: Medium (34-66%) N/A N/A Exposed Structures: Fat Layer (Subcutaneous Tissue): N/A N/A Yes Fascia: No Tendon: No Muscle: No Joint: No Bone: No Epithelialization: None N/A N/A Treatment Notes Electronic Signature(s) Signed: 10/21/2020 9:51:49 AM By: Carlene Coria RN Entered By: Carlene Coria on  10/20/2020 09:48:46 [name], [name] ([id number]) -------------------------------------------------------------------------------- Multi-Disciplinary Care Plan Details Patient Name: [name], [name] Date of Service: 10/20/2020 8:45 AM Medical Record Number: [id number] Patient Account Number: [id number] Date of Birth/Sex: Jul 04, 1965 (54 y.o. F) Treating RN: Carlene Coria Primary Care Enrrique Mierzwa: Iona Beard Other Clinician: Referring Jaimi Belle: Iona Beard Treating Quadre Bristol/Extender: Skipper Cliche in Treatment: 0 Active Inactive Wound/Skin Impairment Nursing Diagnoses: Knowledge deficit related to ulceration/compromised skin integrity Goals: Patient/caregiver will verbalize understanding of skin care regimen Date Initiated: 10/20/2020 Target Resolution Date: 11/20/2020 Goal Status:  Active Ulcer/skin breakdown will have a volume reduction of 30% by week 4 Date Initiated: 10/20/2020 Target Resolution Date: 11/20/2020 Goal Status: Active Ulcer/skin breakdown will have a volume reduction of 50% by week 8 Date Initiated: 10/20/2020 Target Resolution Date: 12/20/2020 Goal Status: Active Ulcer/skin breakdown will have a volume reduction of 80% by week 12 Date Initiated: 10/20/2020 Target Resolution Date: 01/20/2021 Goal Status: Active Ulcer/skin breakdown will heal within 14 weeks Date Initiated: 10/20/2020 Target Resolution Date: 02/19/2021 Goal Status: Active Interventions: Assess patient/caregiver ability to obtain necessary supplies Assess patient/caregiver ability to perform ulcer/skin care regimen upon admission and as needed Assess ulceration(s) every visit Notes: Electronic Signature(s) Signed: 10/21/2020 9:51:49 AM By: Carlene Coria RN Entered By: Carlene Coria on 10/20/2020 09:48:26 [name], [name] ([id number]) -------------------------------------------------------------------------------- Pain Assessment Details Patient Name: [name] Date of Service: 10/20/2020 8:45 AM Medical Record Number: [id number] Patient Account Number: [id number] Date of Birth/Sex: Apr 22, 1965 (55 y.o. F) Treating RN: Carlene Coria Primary Care Madalene Mickler: Iona Beard Other Clinician: Referring Ardis Lawley: Iona Beard Treating Caylyn Tedeschi/Extender: Skipper Cliche in Treatment: 0 Active Problems Location of Pain Severity and Description of Pain Patient Has Paino No Site Locations Pain Management and Medication Current Pain Management: Electronic Signature(s) Signed: 10/21/2020 9:51:49 AM By: Carlene Coria RN Entered By: Carlene Coria on 10/20/2020 09:07:34 [name], [name] ([id number]) -------------------------------------------------------------------------------- Patient/Caregiver Education Details Patient Name: [name] Date of Service:  10/20/2020 8:45 AM Medical Record Number: [id number] Patient Account Number: [id number] Date of Birth/Gender: 1965-10-30 (55 y.o. F) Treating RN: Carlene Coria Primary Care Physician: Iona Beard Other Clinician: Referring Physician: Iona Beard Treating Physician/Extender: Skipper Cliche in Treatment: 0 Education Assessment Education Provided To: Patient Education Topics Provided Wound/Skin Impairment: Methods: Explain/Verbal Responses: State content correctly Electronic Signature(s) Signed: 10/21/2020 9:51:49 AM By: Carlene Coria RN Entered By: Carlene Coria on 10/20/2020 09:49:11 [place], [name] ([id number]) -------------------------------------------------------------------------------- Wound Assessment Details Patient Name: [name] Date of Service: 10/20/2020 8:45 AM Medical Record Number: [id number] Patient Account Number: [id number] Date of Birth/Sex: 1965-04-19 (55 y.o. F) Treating RN: Carlene Coria Primary Care Phelix Fudala: Iona Beard Other Clinician: Referring Seleste Tallman: Iona Beard Treating Shallon Yaklin/Extender: Skipper Cliche in Treatment: 0 Wound Status Wound Number: 1 Primary Etiology: Diabetic Wound/Ulcer of the Lower Extremity Wound Location: Left, Medial Lower Leg Wound Status: Open Wounding Event: Gradually Appeared Comorbid Congestive Heart Failure, Hypertension, Type II History: Diabetes Date Acquired: 09/28/2020 Weeks Of Treatment: 0 Clustered Wound: No Photos Wound Measurements Length: (cm) 1.5 Width: (cm) 2.2 Depth: (cm) 0.1 Area: (cm) 2.592 Volume: (cm) 0.259 % Reduction in Area: % Reduction in Volume: Epithelialization: None Tunneling: No Undermining: No Wound Description Classification: Grade 2 Exudate Amount: Medium Exudate Type: Serosanguineous Exudate Color: red, brown Foul Odor After Cleansing: No Slough/Fibrino Yes Wound Bed Granulation Amount: Medium (34-66%) Exposed Structure Granulation Quality: Red Fascia  Exposed: No Necrotic Amount: Medium (34-66%) Fat Layer (Subcutaneous Tissue) Exposed: Yes Necrotic Quality: Adherent Slough Tendon Exposed: No Muscle Exposed: No Joint  Exposed: No Bone Exposed: No Treatment Notes Wound #1 (Lower Leg) Wound Laterality: Left, Medial Cleanser Normal Saline Discharge Instruction: Wash your hands with soap and water. Remove old dressing, discard into plastic bag and place into trash. Cleanse the wound with Normal Saline prior to applying a clean dressing using gauze sponges, not tissues or cotton balls. Do not scrub or use excessive force. Pat dry using gauze sponges, not tissue or cotton balls. Soap and Water [place], BROOKELIN FELBER ([id number]) Discharge Instruction: Gently cleanse wound with antibacterial soap, rinse and pat dry prior to dressing wounds Peri-Wound Care Topical Primary Dressing Silvercel 4 1/4x 4 1/4 (in/in) Discharge Instruction: Apply Silvercel 4 1/4x 4 1/4 (in/in) as instructed Secondary Dressing Zetuvit Plus Silicone Border Dressing 4x4 (in/in) Secured With Compression Wrap Medichoice 4 layer Compression System, 35-40 mmHG Discharge Instruction: Apply multi-layer wrap as directed. Compression Stockings Add-Ons Electronic Signature(s) Signed: 10/21/2020 9:51:49 AM By: Carlene Coria RN Entered By: Carlene Coria on 10/20/2020 09:22:00 Kilian, [name] ([id number]) -------------------------------------------------------------------------------- Vitals Details Patient Name: [name] Date of Service: 10/20/2020 8:45 AM Medical Record Number: [id number] Patient Account Number: [id number] Date of Birth/Sex: [birth year] (55 y.o. F) Treating RN: Carlene Coria Primary Care Paddy Neis: Iona Beard Other Clinician: Referring Konrad Hoak: Iona Beard Treating Aleese Kamps/Extender: Skipper Cliche in Treatment: 0 Vital Signs Time Taken: 09:07 Temperature (F): 98.2 Height (in): 60 Pulse (bpm): 51 Source: Stated Respiratory  Rate (breaths/min): 18 Weight (lbs): 264 Blood Pressure (mmHg): 138/79 Source: Stated Reference Range: 80 - 120 mg / dl Body Mass Index (BMI): 51.6 Electronic Signature(s) Signed: 10/21/2020 9:51:49 AM By: Carlene Coria RN Entered By: Carlene Coria on 10/20/2020 09:08:33

## 2020-10-21 NOTE — Progress Notes (Signed)
WINOLA, DRUM ([id number]) Visit Report for 10/20/2020 Abuse/Suicide Risk Screen Details Patient Name: [name], [name] Date of Service: 10/20/2020 8:45 AM Medical Record Number: [id number] Patient Account Number: [id number] Date of Birth/Sex: May 07, 1965 (55 y.o. F) Treating RN: Carlene Coria Primary Care Leilani Cespedes: Iona Beard Other Clinician: Referring Quintan Saldivar: Iona Beard Treating Jearldine Cassady/Extender: Skipper Cliche in Treatment: 0 Abuse/Suicide Risk Screen Items Answer ABUSE RISK SCREEN: Has anyone close to you tried to hurt or harm you recentlyo No Do you feel uncomfortable with anyone in your familyo No Has anyone forced you do things that you didnot want to doo No Electronic Signature(s) Signed: 10/21/2020 9:51:49 AM By: Carlene Coria RN Entered By: Carlene Coria on 10/20/2020 09:17:37 Hutt, [name] ([id number]) -------------------------------------------------------------------------------- Activities of Daily Living Details Patient Name: [name] Date of Service: 10/20/2020 8:45 AM Medical Record Number: [id number] Patient Account Number: [id number] Date of Birth/Sex: 1965/08/02 (55 y.o. F) Treating RN: Carlene Coria Primary Care Aleecia Tapia: Iona Beard Other Clinician: Referring Ardeth Repetto: Iona Beard Treating Kee Drudge/Extender: Skipper Cliche in Treatment: 0 Activities of Daily Living Items Answer Activities of Daily Living (Please select one for each item) Drive Automobile Completely Able Take Medications Completely Able Use Telephone Completely Able Care for Appearance Completely Able Use Toilet Completely Able Bath / Shower Completely Able Dress Self Completely Able Feed Self Completely Able Walk Completely Able Get In / Out Bed Completely Able Housework Completely Able Prepare Meals Completely Able Handle Money Completely Able Shop for Self Completely Able Electronic Signature(s) Signed: 10/21/2020 9:51:49 AM By: Carlene Coria RN Entered By: Carlene Coria on 10/20/2020 09:18:03 Kinkade, [name] ([id number]) -------------------------------------------------------------------------------- Education Screening Details Patient Name: [name] Date of Service: 10/20/2020 8:45 AM Medical Record Number: [id number] Patient Account Number: [id number] Date of Birth/Sex: [birth year] (55 y.o. F) Treating RN: Carlene Coria Primary Care Jaggar Benko: Iona Beard Other Clinician: Referring Trung Wenzl: Iona Beard Treating Alucard Fearnow/Extender: Skipper Cliche in Treatment: 0 Primary Learner Assessed: Patient Learning Preferences/Education Level/Primary Language Learning Preference: Explanation Highest Education Level: High School Preferred Language: English Cognitive Barrier Language Barrier: No Translator Needed: No Memory Deficit: No Emotional Barrier: No Cultural/Religious Beliefs Affecting Medical Care: No Physical Barrier Impaired Vision: No Impaired Hearing: No Decreased Hand dexterity: No Knowledge/Comprehension Knowledge Level: Medium Comprehension Level: High Ability to understand written instructions: High Ability to understand verbal instructions: High Motivation Anxiety Level: Anxious Cooperation: Cooperative Education Importance: Acknowledges Need Interest in Health Problems: Asks Questions Perception: Coherent Willingness to Engage in Self-Management High Activities: Readiness to Engage in Self-Management High Activities: Electronic Signature(s) Signed: 10/21/2020 9:51:49 AM By: Carlene Coria RN Entered By: Carlene Coria on 10/20/2020 09:18:39 Hillier, [name] ([id number]) -------------------------------------------------------------------------------- Fall Risk Assessment Details Patient Name: [name] Date of Service: 10/20/2020 8:45 AM Medical Record Number: [id number] Patient Account Number: [id number] Date of Birth/Sex: [birth year] (55 y.o. F) Treating  RN: Carlene Coria Primary Care Kieon Lawhorn: Iona Beard Other Clinician: Referring Inanna Telford: Iona Beard Treating Journii Nierman/Extender: Skipper Cliche in Treatment: 0 Fall Risk Assessment Items Have you had 2 or more falls in the last 12 monthso 0 No Have you had any fall that resulted in injury in the last 12 monthso 0 No FALLS RISK SCREEN History of falling - immediate or within 3 months 0 No Secondary diagnosis (Do you have 2 or more medical diagnoseso) 0 No Ambulatory aid None/bed rest/wheelchair/nurse 0 Yes Crutches/cane/walker 0 No Furniture 0 No Intravenous therapy Access/Saline/Heparin Lock 0 No Gait/Transferring Normal/ bed rest/ wheelchair 0 Yes Weak (short steps with or  without shuffle, stooped but able to lift head while walking, may 0 No seek support from furniture) Impaired (short steps with shuffle, may have difficulty arising from chair, head down, impaired 0 No balance) Mental Status Oriented to own ability 0 Yes Electronic Signature(s) Signed: 10/21/2020 9:51:49 AM By: Carlene Coria RN Entered By: Carlene Coria on 10/20/2020 09:19:09 Schuneman, [name] ([id number]) -------------------------------------------------------------------------------- Foot Assessment Details Patient Name: [name] Date of Service: 10/20/2020 8:45 AM Medical Record Number: [id number] Patient Account Number: [id number] Date of Birth/Sex: 1965/06/01 (55 y.o. F) Treating RN: Carlene Coria Primary Care Aliani Caccavale: Iona Beard Other Clinician: Referring Jacqulene Huntley: Iona Beard Treating Daemyn Gariepy/Extender: Skipper Cliche in Treatment: 0 Foot Assessment Items Site Locations + = Sensation present, - = Sensation absent, C = Callus, U = Ulcer R = Redness, W = Warmth, M = Maceration, PU = Pre-ulcerative lesion F = Fissure, S = Swelling, D = Dryness Assessment Right: Left: Other Deformity: No No Prior Foot Ulcer: No No Prior Amputation: No No Charcot Joint: No No Ambulatory  Status: Ambulatory Without Help Gait: Steady Electronic Signature(s) Signed: 10/21/2020 9:51:49 AM By: Carlene Coria RN Entered By: Carlene Coria on 10/20/2020 09:20:14 Beilke, [name] ([id number]) -------------------------------------------------------------------------------- Nutrition Risk Screening Details Patient Name: [name] Date of Service: 10/20/2020 8:45 AM Medical Record Number: [id number] Patient Account Number: [id number] Date of Birth/Sex: 07-10-1965 (55 y.o. F) Treating RN: Carlene Coria Primary Care Jasdeep Dejarnett: Iona Beard Other Clinician: Referring Melitza Metheny: Iona Beard Treating Deaundre Allston/Extender: Skipper Cliche in Treatment: 0 Height (in): 60 Weight (lbs): 264 Body Mass Index (BMI): 51.6 Nutrition Risk Screening Items Score Screening NUTRITION RISK SCREEN: I have an illness or condition that made me change the kind and/or amount of food I eat 0 No I eat fewer than two meals per day 0 No I eat few fruits and vegetables, or milk products 0 No I have three or more drinks of beer, liquor or wine almost every day 0 No I have tooth or mouth problems that make it hard for me to eat 0 No I don't always have enough money to buy the food I need 0 No I eat alone most of the time 0 No I take three or more different prescribed or over-the-counter drugs a day 1 Yes Without wanting to, I have lost or gained 10 pounds in the last six months 0 No I am not always physically able to shop, cook and/or feed myself 0 No Nutrition Protocols Good Risk Protocol 0 No interventions needed Moderate Risk Protocol High Risk Proctocol Risk Level: Good Risk Score: 1 Electronic Signature(s) Signed: 10/21/2020 9:51:49 AM By: Carlene Coria RN Entered By: Carlene Coria on 10/20/2020 09:19:47

## 2020-10-21 NOTE — Progress Notes (Signed)
OMEKA, HOLBEN ([id number]) Visit Report for 10/20/2020 Chief Complaint Document Details Patient Name: [name] Date of Service: 10/20/2020 8:45 AM Medical Record Number: [id number] Patient Account Number: [id number] Date of Birth/Sex: 11-03-1965 (55 y.o. F) Treating RN: Carlene Coria Primary Care Provider: Iona Beard Other Clinician: Referring Provider: Iona Beard Treating Provider/Extender: Skipper Cliche in Treatment: 0 Information Obtained from: Patient Chief Complaint Left LE Ulcer Electronic Signature(s) Signed: 10/20/2020 9:46:31 AM By: Worthy Keeler PA-C Entered By: Worthy Keeler on 10/20/2020 09:46:31 Niday, [name] ([id number]) -------------------------------------------------------------------------------- HPI Details Patient Name: [name] Date of Service: 10/20/2020 8:45 AM Medical Record Number: [id number] Patient Account Number: [id number] Date of Birth/Sex: 08/26/1965 (55 y.o. F) Treating RN: Carlene Coria Primary Care Provider: Iona Beard Other Clinician: Referring Provider: Iona Beard Treating Provider/Extender: Skipper Cliche in Treatment: 0 History of Present Illness HPI Description: 10/20/2020 upon evaluation today patient appears to be doing somewhat poorly in regard to her left leg where she has a significant wound although not very deep but this is definitely due to her chronic swelling. Fortunately there does not appear to be any signs of active infection at this time which is great news. There was a lot of scabbing going on and then she had areas that were somewhat open up underneath. It does appear to be very lymphedema like in nature. With that being said I do not see any signs of active infection currently. I do think that based on what we are seeing at the moment this is likely an area that will respond well to appropriate compression therapy. If it does not then we will need to reevaluate the situation  for sure. The patient does have a history of diabetes mellitus type 2, chronic venous insufficiency, lymphedema, chronic kidney disease stage III, and congestive heart failure. She is status post bariatric surgery as well. Overall I am pleased with where things stand I think she does need to definitely have compression in place. This wound has been present for about 4 to 6 weeks she tells me. Electronic Signature(s) Signed: 10/20/2020 10:32:27 AM By: Worthy Keeler PA-C Entered By: Worthy Keeler on 10/20/2020 10:32:27 [name], [name] ([id number]) -------------------------------------------------------------------------------- Physical Exam Details Patient Name: [name] Date of Service: 10/20/2020 8:45 AM Medical Record Number: [id number] Patient Account Number: [id number] Date of Birth/Sex: 09/03/1965 (55 y.o. F) Treating RN: Carlene Coria Primary Care Provider: Iona Beard Other Clinician: Referring Provider: Iona Beard Treating Provider/Extender: Skipper Cliche in Treatment: 0 Constitutional sitting or standing blood pressure is within target range for patient.. pulse regular and within target range for patient.Marland Kitchen respirations regular, non- labored and within target range for patient.Marland Kitchen temperature within target range for patient.. Obese and well-hydrated in no acute distress. Eyes conjunctiva clear no eyelid edema noted. pupils equal round and reactive to light and accommodation. Ears, Nose, Mouth, and Throat no gross abnormality of ear auricles or external auditory canals. normal hearing noted during conversation. mucus membranes moist. Respiratory normal breathing without difficulty. Cardiovascular 2+ dorsalis pedis/posterior tibialis pulses. Patient has bilateral stage II lymphedema bordering on stage III.Marland Kitchen Musculoskeletal normal gait and posture. no significant deformity or arthritic changes, no loss or range of motion, no clubbing. Psychiatric this  patient is able to make decisions and demonstrates good insight into disease process. Alert and Oriented x 3. pleasant and cooperative. Notes Upon inspection patient's wound bed actually showed signs of a somewhat irregular surface to the wound. There does not appear to be  any signs of infection but I do feel like that based on what I am seeing currently this likely represents more of a chronic venous stasis/lymphedema type issue although I think lymphedema changes are early I would say it is definitely a stage II situation were dealing with at this point. I explained to the patient and had a fairly lengthy conversation about this that she needs to be wearing compression socks on a regular basis where else things are going to worsen significantly with regard to her swelling. Electronic Signature(s) Signed: 10/20/2020 5:49:14 PM By: Worthy Keeler PA-C Entered By: Worthy Keeler on 10/20/2020 17:49:14 [name], [name] ([id number]) -------------------------------------------------------------------------------- Physician Orders Details Patient Name: [name] Date of Service: 10/20/2020 8:45 AM Medical Record Number: [id number] Patient Account Number: [id number] Date of Birth/Sex: 10/05/1965 (55 y.o. F) Treating RN: Carlene Coria Primary Care Provider: Iona Beard Other Clinician: Referring Provider: Iona Beard Treating Provider/Extender: Skipper Cliche in Treatment: 0 Verbal / Phone Orders: No Diagnosis Coding ICD-10 Coding Code Description E11.622 Type 2 diabetes mellitus with other skin ulcer L97.822 Non-pressure chronic ulcer of other part of left lower leg with fat layer exposed N18.30 Chronic kidney disease, stage 3 unspecified 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.84 Bariatric surgery status Follow-up Appointments o Return Appointment in 1 week. o Nurse Visit as needed - thursday or friday Bathing/  Shower/ Hygiene o May shower with wound dressing protected with water repellent cover or cast protector. Edema Control - Lymphedema / Segmental Compressive Device / Other o Optional: One layer of unna paste to top of compression wrap (to act as an anchor). o Elevate, Exercise Daily and Avoid Standing for Long Periods of Time. o Elevate legs to the level of the heart and pump ankles as often as possible o Elevate leg(s) parallel to the floor when sitting. Wound Treatment Wound #1 - Lower Leg Wound Laterality: Left, Medial Cleanser: Normal Saline 2 x Per Week/30 Days Discharge Instructions: Wash your hands with soap and water. Remove old dressing, discard into plastic bag and place into trash. Cleanse the wound with Normal Saline prior to applying a clean dressing using gauze sponges, not tissues or cotton balls. Do not scrub or use excessive force. Pat dry using gauze sponges, not tissue or cotton balls. Cleanser: Soap and Water 2 x Per Week/30 Days Discharge Instructions: Gently cleanse wound with antibacterial soap, rinse and pat dry prior to dressing wounds Primary Dressing: Silvercel 4 1/4x 4 1/4 (in/in) 2 x Per Week/30 Days Discharge Instructions: Apply Silvercel 4 1/4x 4 1/4 (in/in) as instructed Secondary Dressing: Zetuvit Plus Silicone Border Dressing 4x4 (in/in) 2 x Per Week/30 Days Compression Wrap: Medichoice 4 layer Compression System, 35-40 mmHG 2 x Per Week/30 Days Discharge Instructions: Apply multi-layer wrap as directed. Electronic Signature(s) Signed: 10/20/2020 5:51:22 PM By: Worthy Keeler PA-C Signed: 10/21/2020 9:51:49 AM By: Carlene Coria RN Entered By: Carlene Coria on 10/20/2020 10:07:57 [name], [name] ([id number]) -------------------------------------------------------------------------------- Problem List Details Patient Name: [name] Date of Service: 10/20/2020 8:45 AM Medical Record Number: [id number] Patient Account Number:  [id number] Date of Birth/Sex: [birth year] (55 y.o. F) Treating RN: Carlene Coria Primary Care Provider: Iona Beard Other Clinician: Referring Provider: Iona Beard Treating Provider/Extender: Skipper Cliche in Treatment: 0 Active Problems ICD-10 Encounter Code Description Active Date MDM Diagnosis I89.0 Lymphedema, not elsewhere classified 10/20/2020 No Yes I87.2 Venous insufficiency (chronic) (peripheral) 10/20/2020 No Yes E11.622 Type 2 diabetes mellitus with other skin ulcer 10/20/2020  No Yes L97.822 Non-pressure chronic ulcer of other part of left lower leg with fat layer 10/20/2020 No Yes exposed N18.30 Chronic kidney disease, stage 3 unspecified 10/20/2020 No Yes I50.42 Chronic combined systolic (congestive) and diastolic (congestive) heart 10/20/2020 No Yes failure Z98.84 Bariatric surgery status 10/20/2020 No Yes Inactive Problems Resolved Problems Electronic Signature(s) Signed: 10/20/2020 10:33:23 AM By: Worthy Keeler PA-C Previous Signature: 10/20/2020 9:45:57 AM Version By: Worthy Keeler PA-C Previous Signature: 10/20/2020 9:43:48 AM Version By: Worthy Keeler PA-C Entered By: Worthy Keeler on 10/20/2020 10:33:23 [name], [name] ([id number]) -------------------------------------------------------------------------------- Progress Note Details Patient Name: [name] Date of Service: 10/20/2020 8:45 AM Medical Record Number: [id number] Patient Account Number: [id number] Date of Birth/Sex: 1965-04-30 (55 y.o. F) Treating RN: Carlene Coria Primary Care Provider: Iona Beard Other Clinician: Referring Provider: Iona Beard Treating Provider/Extender: Skipper Cliche in Treatment: 0 Subjective Chief Complaint Information obtained from Patient Left LE Ulcer History of Present Illness (HPI) 10/20/2020 upon evaluation today patient appears to be doing somewhat poorly in regard to her left leg where she has a significant wound although not very deep  but this is definitely due to her chronic swelling. Fortunately there does not appear to be any signs of active infection at this time which is great news. There was a lot of scabbing going on and then she had areas that were somewhat open up underneath. It does appear to be very lymphedema like in nature. With that being said I do not see any signs of active infection currently. I do think that based on what we are seeing at the moment this is likely an area that will respond well to appropriate compression therapy. If it does not then we will need to reevaluate the situation for sure. The patient does have a history of diabetes mellitus type 2, chronic venous insufficiency, lymphedema, chronic kidney disease stage III, and congestive heart failure. She is status post bariatric surgery as well. Overall I am pleased with where things stand I think she does need to definitely have compression in place. This wound has been present for about 4 to 6 weeks she tells me. Patient History Allergies No Known Allergies Social History Former smoker, Marital Status - Single, Alcohol Use - Rarely, Drug Use - No History, Caffeine Use - Rarely. Medical History Cardiovascular Patient has history of Congestive Heart Failure, Hypertension Endocrine Patient has history of Type II Diabetes Patient is treated with Insulin, Oral Agents. Review of Systems (ROS) Genitourinary Complains or has symptoms of Kidney failure/ Dialysis. Integumentary (Skin) Complains or has symptoms of Wounds. Objective Constitutional sitting or standing blood pressure is within target range for patient.. pulse regular and within target range for patient.Marland Kitchen respirations regular, non- labored and within target range for patient.Marland Kitchen temperature within target range for patient.. Obese and well-hydrated in no acute distress. Vitals Time Taken: 9:07 AM, Height: 60 in, Source: Stated, Weight: 264 lbs, Source: Stated, BMI: 51.6, Temperature:  98.2 F, Pulse: 51 bpm, Respiratory Rate: 18 breaths/min, Blood Pressure: 138/79 mmHg. Eyes conjunctiva clear no eyelid edema noted. pupils equal round and reactive to light and accommodation. Ears, Nose, Mouth, and Throat no gross abnormality of ear auricles or external auditory canals. normal hearing noted during conversation. mucus membranes moist. [name], Lori R. ([id number]) Respiratory normal breathing without difficulty. Cardiovascular 2+ dorsalis pedis/posterior tibialis pulses. Patient has bilateral stage II lymphedema bordering on stage III.Marland Kitchen Musculoskeletal normal gait and posture. no significant deformity or arthritic changes, no loss or range  of motion, no clubbing. Psychiatric this patient is able to make decisions and demonstrates good insight into disease process. Alert and Oriented x 3. pleasant and cooperative. General Notes: Upon inspection patient's wound bed actually showed signs of a somewhat irregular surface to the wound. There does not appear to be any signs of infection but I do feel like that based on what I am seeing currently this likely represents more of a chronic venous stasis/lymphedema type issue although I think lymphedema changes are early I would say it is definitely a stage II situation were dealing with at this point. I explained to the patient and had a fairly lengthy conversation about this that she needs to be wearing compression socks on a regular basis where else things are going to worsen significantly with regard to her swelling. Integumentary (Hair, Skin) Wound #1 status is Open. Original cause of wound was Gradually Appeared. The date acquired was: 09/28/2020. The wound is located on the Left,Medial Lower Leg. The wound measures 1.5cm length x 2.2cm width x 0.1cm depth; 2.592cm^2 area and 0.259cm^3 volume. There is Fat Layer (Subcutaneous Tissue) exposed. There is no tunneling or undermining noted. There is a medium amount of serosanguineous  drainage noted. There is medium (34-66%) red granulation within the wound bed. There is a medium (34-66%) amount of necrotic tissue within the wound bed including Adherent Slough. Assessment Active Problems ICD-10 Lymphedema, not elsewhere classified Venous insufficiency (chronic) (peripheral) Type 2 diabetes mellitus with other skin ulcer Non-pressure chronic ulcer of other part of left lower leg with fat layer exposed Chronic kidney disease, stage 3 unspecified Chronic combined systolic (congestive) and diastolic (congestive) heart failure Bariatric surgery status Procedures Wound #1 Pre-procedure diagnosis of Wound #1 is a Diabetic Wound/Ulcer of the Lower Extremity located on the Left,Medial Lower Leg . There was a Four Layer Compression Therapy Procedure by Carlene Coria, RN. Post procedure Diagnosis Wound #1: Same as Pre-Procedure Plan Follow-up Appointments: Return Appointment in 1 week. Nurse Visit as needed - thursday or friday Bathing/ Shower/ Hygiene: May shower with wound dressing protected with water repellent cover or cast protector. Edema Control - Lymphedema / Segmental Compressive Device / Other: Optional: One layer of unna paste to top of compression wrap (to act as an anchor). Elevate, Exercise Daily and Avoid Standing for Long Periods of Time. Elevate legs to the level of the heart and pump ankles as often as possible Elevate leg(s) parallel to the floor when sitting. WOUND #1: - Lower Leg Wound Laterality: Left, Medial Cleanser: Normal Saline 2 x Per Week/30 Days Discharge Instructions: Wash your hands with soap and water. Remove old dressing, discard into plastic bag and place into trash. Cleanse the wound with Normal Saline prior to applying a clean dressing using gauze sponges, not tissues or cotton balls. Do not scrub or use [name], Lori R. ([id number]) excessive force. Pat dry using gauze sponges, not tissue or cotton balls. Cleanser: Soap and Water  2 x Per Week/30 Days Discharge Instructions: Gently cleanse wound with antibacterial soap, rinse and pat dry prior to dressing wounds Primary Dressing: Silvercel 4 1/4x 4 1/4 (in/in) 2 x Per Week/30 Days Discharge Instructions: Apply Silvercel 4 1/4x 4 1/4 (in/in) as instructed Secondary Dressing: Zetuvit Plus Silicone Border Dressing 4x4 (in/in) 2 x Per Week/30 Days Compression Wrap: Medichoice 4 layer Compression System, 35-40 mmHG 2 x Per Week/30 Days Discharge Instructions: Apply multi-layer wrap as directed. 1. I would recommend currently that we go ahead and initiate treatment at this point  with a silver alginate dressing I think this can be the best way to go and the patient is in agreement with plan. 2. I am also can recommend that we use Zetuvit over top of this. 3. I am also can recommend that we initiate a 4-layer compression wrap to help with edema control. I think this is good to be the best way to go based on what I am seeing currently. 4. I did have a fairly lengthy conversation with the patient concerning the need for ongoing compression socks in order to keep her edema under good control. She voiced an understanding. She is going to see about ordering some as well which I think would definitely be of benefit. We will see patient back for reevaluation in 1 week here in the clinic. If anything worsens or changes patient will contact our office for additional recommendations. Electronic Signature(s) Signed: 10/20/2020 5:50:12 PM By: Worthy Keeler PA-C Entered By: Worthy Keeler on 10/20/2020 17:50:12 [name], [name] ([id number]) -------------------------------------------------------------------------------- ROS/PFSH Details Patient Name: [name] Date of Service: 10/20/2020 8:45 AM Medical Record Number: [id number] Patient Account Number: [id number] Date of Birth/Sex: 1965/12/09 (55 y.o. F) Treating RN: Carlene Coria Primary Care Provider: Iona Beard  Other Clinician: Referring Provider: Iona Beard Treating Provider/Extender: Skipper Cliche in Treatment: 0 Genitourinary Complaints and Symptoms: Positive for: Kidney failure/ Dialysis Integumentary (Skin) Complaints and Symptoms: Positive for: Wounds Cardiovascular Medical History: Positive for: Congestive Heart Failure; Hypertension Endocrine Medical History: Positive for: Type II Diabetes Time with diabetes: 24 years Treated with: Insulin, Oral agents Immunizations Pneumococcal Vaccine: Received Pneumococcal Vaccination: No Implantable Devices None Family and Social History Former smoker; Marital Status - Single; Alcohol Use: Rarely; Drug Use: No History; Caffeine Use: Rarely; Financial Concerns: No; Food, Clothing or Shelter Needs: No; Support System Lacking: No; Transportation Concerns: No Electronic Signature(s) Signed: 10/20/2020 5:51:22 PM By: Worthy Keeler PA-C Signed: 10/21/2020 9:51:49 AM By: Carlene Coria RN Entered By: Carlene Coria on 10/20/2020 09:17:28 [name], [name] ([id number]) -------------------------------------------------------------------------------- SuperBill Details Patient Name: [name] Date of Service: 10/20/2020 Medical Record Number: [id number] Patient Account Number: [id number] Date of Birth/Sex: 02-12-1966 (55 y.o. F) Treating RN: Carlene Coria Primary Care Provider: Iona Beard Other Clinician: Referring Provider: Iona Beard Treating Provider/Extender: Skipper Cliche in Treatment: 0 Diagnosis Coding ICD-10 Codes Code Description E11.622 Type 2 diabetes mellitus with other skin ulcer L97.822 Non-pressure chronic ulcer of other part of left lower leg with fat layer exposed N18.30 Chronic kidney disease, stage 3 unspecified 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.84 Bariatric surgery status Facility Procedures CPT4 Code: 60630160 Description: 99213 - WOUND  CARE VISIT-LEV 3 EST PT Modifier: Quantity: 1 Physician Procedures CPT4 Code: 1093235 Description: 57322 - WC PHYS LEVEL 4 - NEW PT Modifier: Quantity: 1 CPT4 Code: Description: ICD-10 Diagnosis Description E11.622 Type 2 diabetes mellitus with other skin ulcer L97.822 Non-pressure chronic ulcer of other part of left lower leg with fat laye N18.30 Chronic kidney disease, stage 3 unspecified I50.42 Chronic combined  systolic (congestive) and diastolic (congestive) heart Modifier: r exposed failure Quantity: Electronic Signature(s) Signed: 10/20/2020 5:50:46 PM By: Worthy Keeler PA-C Entered By: Worthy Keeler on 10/20/2020 17:50:45

## 2020-10-27 ENCOUNTER — Ambulatory Visit: Payer: Medicare Other | Admitting: Physician Assistant

## 2020-10-27 DIAGNOSIS — I129 Hypertensive chronic kidney disease with stage 1 through stage 4 chronic kidney disease, or unspecified chronic kidney disease: Secondary | ICD-10-CM | POA: Diagnosis not present

## 2020-10-27 DIAGNOSIS — E1129 Type 2 diabetes mellitus with other diabetic kidney complication: Secondary | ICD-10-CM | POA: Diagnosis not present

## 2020-10-27 DIAGNOSIS — N189 Chronic kidney disease, unspecified: Secondary | ICD-10-CM | POA: Diagnosis not present

## 2020-10-27 DIAGNOSIS — E1122 Type 2 diabetes mellitus with diabetic chronic kidney disease: Secondary | ICD-10-CM | POA: Diagnosis not present

## 2020-10-27 DIAGNOSIS — R809 Proteinuria, unspecified: Secondary | ICD-10-CM | POA: Diagnosis not present

## 2020-10-28 DIAGNOSIS — E1129 Type 2 diabetes mellitus with other diabetic kidney complication: Secondary | ICD-10-CM | POA: Diagnosis not present

## 2020-10-28 DIAGNOSIS — E039 Hypothyroidism, unspecified: Secondary | ICD-10-CM | POA: Diagnosis not present

## 2020-10-28 DIAGNOSIS — N189 Chronic kidney disease, unspecified: Secondary | ICD-10-CM | POA: Diagnosis not present

## 2020-10-28 DIAGNOSIS — I129 Hypertensive chronic kidney disease with stage 1 through stage 4 chronic kidney disease, or unspecified chronic kidney disease: Secondary | ICD-10-CM | POA: Diagnosis not present

## 2020-10-28 DIAGNOSIS — D631 Anemia in chronic kidney disease: Secondary | ICD-10-CM | POA: Diagnosis not present

## 2020-10-28 DIAGNOSIS — E211 Secondary hyperparathyroidism, not elsewhere classified: Secondary | ICD-10-CM | POA: Diagnosis not present

## 2020-10-28 DIAGNOSIS — R809 Proteinuria, unspecified: Secondary | ICD-10-CM | POA: Diagnosis not present

## 2020-10-28 DIAGNOSIS — N1832 Chronic kidney disease, stage 3b: Secondary | ICD-10-CM | POA: Diagnosis not present

## 2020-10-28 DIAGNOSIS — E1122 Type 2 diabetes mellitus with diabetic chronic kidney disease: Secondary | ICD-10-CM | POA: Diagnosis not present

## 2020-10-28 DIAGNOSIS — E870 Hyperosmolality and hypernatremia: Secondary | ICD-10-CM | POA: Diagnosis not present

## 2020-10-29 ENCOUNTER — Encounter: Payer: Medicare Other | Attending: Physician Assistant | Admitting: Physician Assistant

## 2020-10-29 ENCOUNTER — Other Ambulatory Visit: Payer: Self-pay

## 2020-10-29 DIAGNOSIS — N183 Chronic kidney disease, stage 3 unspecified: Secondary | ICD-10-CM | POA: Diagnosis not present

## 2020-10-29 DIAGNOSIS — Z9884 Bariatric surgery status: Secondary | ICD-10-CM | POA: Diagnosis not present

## 2020-10-29 DIAGNOSIS — E11622 Type 2 diabetes mellitus with other skin ulcer: Secondary | ICD-10-CM | POA: Insufficient documentation

## 2020-10-29 DIAGNOSIS — E1122 Type 2 diabetes mellitus with diabetic chronic kidney disease: Secondary | ICD-10-CM | POA: Insufficient documentation

## 2020-10-29 DIAGNOSIS — I890 Lymphedema, not elsewhere classified: Secondary | ICD-10-CM | POA: Insufficient documentation

## 2020-10-29 DIAGNOSIS — I5042 Chronic combined systolic (congestive) and diastolic (congestive) heart failure: Secondary | ICD-10-CM | POA: Diagnosis not present

## 2020-10-29 DIAGNOSIS — I130 Hypertensive heart and chronic kidney disease with heart failure and stage 1 through stage 4 chronic kidney disease, or unspecified chronic kidney disease: Secondary | ICD-10-CM | POA: Insufficient documentation

## 2020-10-29 DIAGNOSIS — L97822 Non-pressure chronic ulcer of other part of left lower leg with fat layer exposed: Secondary | ICD-10-CM | POA: Diagnosis not present

## 2020-10-29 DIAGNOSIS — I872 Venous insufficiency (chronic) (peripheral): Secondary | ICD-10-CM | POA: Insufficient documentation

## 2020-10-29 DIAGNOSIS — E1151 Type 2 diabetes mellitus with diabetic peripheral angiopathy without gangrene: Secondary | ICD-10-CM | POA: Diagnosis not present

## 2020-10-29 NOTE — Progress Notes (Signed)
KHILEE, HENDRICKSEN ([id number]) Visit Report for 10/29/2020 Chief Complaint Document Details Patient Name: [name], [name] Date of Service: 10/29/2020 8:00 AM Medical Record Number: [id number] Patient Account Number: [id number] Date of Birth/Sex: May 02, 1965 (55 y.o. F) Treating RN: Carlene Coria Primary Care Provider: Iona Beard Other Clinician: Referring Provider: Iona Beard Treating Provider/Extender: Skipper Cliche in Treatment: 1 Information Obtained from: Patient Chief Complaint Left LE Ulcer Electronic Signature(s) Signed: 10/29/2020 8:33:25 AM By: Worthy Keeler PA-C Entered By: Worthy Keeler on 10/29/2020 08:33:25 Vandenbosch, [name] ([id number]) -------------------------------------------------------------------------------- HPI Details Patient Name: [name] Date of Service: 10/29/2020 8:00 AM Medical Record Number: [id number] Patient Account Number: [id number] Date of Birth/Sex: 03-29-1965 (55 y.o. F) Treating RN: Carlene Coria Primary Care Provider: Iona Beard Other Clinician: Referring Provider: Iona Beard Treating Provider/Extender: Skipper Cliche in Treatment: 1 History of Present Illness HPI Description: 10/20/2020 upon evaluation today patient appears to be doing somewhat poorly in regard to her left leg where she has a significant wound although not very deep but this is definitely due to her chronic swelling. Fortunately there does not appear to be any signs of active infection at this time which is great news. There was a lot of scabbing going on and then she had areas that were somewhat open up underneath. It does appear to be very lymphedema like in nature. With that being said I do not see any signs of active infection currently. I do think that based on what we are seeing at the moment this is likely an area that will respond well to appropriate compression therapy. If it does not then we will need to reevaluate the situation for  sure. The patient does have a history of diabetes mellitus type 2, chronic venous insufficiency, lymphedema, chronic kidney disease stage III, and congestive heart failure. She is status post bariatric surgery as well. Overall I am pleased with where things stand I think she does need to definitely have compression in place. This wound has been present for about 4 to 6 weeks she tells me. 10/29/2020 upon evaluation today patient actually appears to be doing much better. She has been tolerating the dressing changes without complication and the compression wrap did slide down her a bit but to be perfectly honest I am very pleased overall with well the wound appears today. I do not see any signs of active infection which is also great news. Nonetheless I think that we are definitely headed in the appropriate direction as far as healing is concerned and in fact this is significantly improved even as compared to just last week. Electronic Signature(s) Signed: 10/29/2020 9:21:33 AM By: Worthy Keeler PA-C Entered By: Worthy Keeler on 10/29/2020 09:21:33 Kleinschmidt, [name] ([id number]) -------------------------------------------------------------------------------- Physical Exam Details Patient Name: [name] Date of Service: 10/29/2020 8:00 AM Medical Record Number: [id number] Patient Account Number: [id number] Date of Birth/Sex: 10/02/1965 (55 y.o. F) Treating RN: Carlene Coria Primary Care Provider: Iona Beard Other Clinician: Referring Provider: Iona Beard Treating Provider/Extender: Skipper Cliche in Treatment: 1 Constitutional Well-nourished and well-hydrated in no acute distress. Respiratory normal breathing without difficulty. Psychiatric this patient is able to make decisions and demonstrates good insight into disease process. Alert and Oriented x 3. pleasant and cooperative. Notes Upon inspection patient's wound bed actually showed signs of good granulation  epithelization at this point. Fortunately there does not appear to be any signs of active infection which is great news and overall I am extremely pleased with  where things stand today. I did not even have to perform any sharp debridement as the wound did appear to be doing so great today. I think that she is actually getting fairly close to healing. This is awesome news. Electronic Signature(s) Signed: 10/29/2020 9:22:08 AM By: Worthy Keeler PA-C Entered By: Worthy Keeler on 10/29/2020 09:22:08 KYUNG, MUTO ([id number]) -------------------------------------------------------------------------------- Physician Orders Details Patient Name: [name] Date of Service: 10/29/2020 8:00 AM Medical Record Number: [id number] Patient Account Number: [id number] Date of Birth/Sex: 12/18/1965 (55 y.o. F) Treating RN: Carlene Coria Primary Care Provider: Iona Beard Other Clinician: Referring Provider: Iona Beard Treating Provider/Extender: Skipper Cliche in Treatment: 1 Verbal / Phone Orders: No Diagnosis Coding ICD-10 Coding Code Description I89.0 Lymphedema, not elsewhere classified I87.2 Venous insufficiency (chronic) (peripheral) E11.622 Type 2 diabetes mellitus with other skin ulcer L97.822 Non-pressure chronic ulcer of other part of left lower leg with fat layer exposed N18.30 Chronic kidney disease, stage 3 unspecified 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.84 Bariatric surgery status Follow-up Appointments o Return Appointment in 1 week. o Nurse Visit as needed - thursday or friday Bathing/ Shower/ Hygiene o May shower with wound dressing protected with water repellent cover or cast protector. Edema Control - Lymphedema / Segmental Compressive Device / Other o Optional: One layer of unna paste to top of compression wrap (to act as an anchor). o Elevate, Exercise Daily and Avoid Standing for Long  Periods of Time. o Elevate legs to the level of the heart and pump ankles as often as possible o Elevate leg(s) parallel to the floor when sitting. Wound Treatment Wound #1 - Lower Leg Wound Laterality: Left, Medial Cleanser: Normal Saline 2 x Per Week/30 Days Discharge Instructions: Wash your hands with soap and water. Remove old dressing, discard into plastic bag and place into trash. Cleanse the wound with Normal Saline prior to applying a clean dressing using gauze sponges, not tissues or cotton balls. Do not scrub or use excessive force. Pat dry using gauze sponges, not tissue or cotton balls. Cleanser: Soap and Water 2 x Per Week/30 Days Discharge Instructions: Gently cleanse wound with antibacterial soap, rinse and pat dry prior to dressing wounds Primary Dressing: Silvercel 4 1/4x 4 1/4 (in/in) 2 x Per Week/30 Days Discharge Instructions: Apply Silvercel 4 1/4x 4 1/4 (in/in) as instructed Secondary Dressing: Zetuvit Plus Silicone Border Dressing 4x4 (in/in) 2 x Per Week/30 Days Compression Wrap: Medichoice 4 layer Compression System, 35-40 mmHG 2 x Per Week/30 Days Discharge Instructions: Apply multi-layer wrap as directed. Electronic Signature(s) Signed: 10/29/2020 9:24:10 AM By: Worthy Keeler PA-C Entered By: Worthy Keeler on 10/29/2020 09:24:10 Penna, [name] ([id number]) -------------------------------------------------------------------------------- Problem List Details Patient Name: [name] Date of Service: 10/29/2020 8:00 AM Medical Record Number: [id number] Patient Account Number: [id number] Date of Birth/Sex: Dec 07, 1965 (55 y.o. F) Treating RN: Carlene Coria Primary Care Provider: Iona Beard Other Clinician: Referring Provider: Iona Beard Treating Provider/Extender: Skipper Cliche in Treatment: 1 Active Problems ICD-10 Encounter Code Description Active Date MDM Diagnosis I89.0 Lymphedema, not elsewhere classified 10/20/2020 No  Yes I87.2 Venous insufficiency (chronic) (peripheral) 10/20/2020 No Yes E11.622 Type 2 diabetes mellitus with other skin ulcer 10/20/2020 No Yes L97.822 Non-pressure chronic ulcer of other part of left lower leg with fat layer 10/20/2020 No Yes exposed N18.30 Chronic kidney disease, stage 3 unspecified 10/20/2020 No Yes I50.42 Chronic combined systolic (congestive) and diastolic (congestive) heart 10/20/2020 No Yes failure Z98.84 Bariatric surgery  status 10/20/2020 No Yes Inactive Problems Resolved Problems Electronic Signature(s) Signed: 10/29/2020 8:33:17 AM By: Worthy Keeler PA-C Entered By: Worthy Keeler on 10/29/2020 08:33:17 Tow, [name] ([id number]) -------------------------------------------------------------------------------- Progress Note Details Patient Name: [name] Date of Service: 10/29/2020 8:00 AM Medical Record Number: [id number] Patient Account Number: [id number] Date of Birth/Sex: November 16, 1965 (55 y.o. F) Treating RN: Carlene Coria Primary Care Provider: Iona Beard Other Clinician: Referring Provider: Iona Beard Treating Provider/Extender: Skipper Cliche in Treatment: 1 Subjective Chief Complaint Information obtained from Patient Left LE Ulcer History of Present Illness (HPI) 10/20/2020 upon evaluation today patient appears to be doing somewhat poorly in regard to her left leg where she has a significant wound although not very deep but this is definitely due to her chronic swelling. Fortunately there does not appear to be any signs of active infection at this time which is great news. There was a lot of scabbing going on and then she had areas that were somewhat open up underneath. It does appear to be very lymphedema like in nature. With that being said I do not see any signs of active infection currently. I do think that based on what we are seeing at the moment this is likely an area that will respond well to appropriate compression  therapy. If it does not then we will need to reevaluate the situation for sure. The patient does have a history of diabetes mellitus type 2, chronic venous insufficiency, lymphedema, chronic kidney disease stage III, and congestive heart failure. She is status post bariatric surgery as well. Overall I am pleased with where things stand I think she does need to definitely have compression in place. This wound has been present for about 4 to 6 weeks she tells me. 10/29/2020 upon evaluation today patient actually appears to be doing much better. She has been tolerating the dressing changes without complication and the compression wrap did slide down her a bit but to be perfectly honest I am very pleased overall with well the wound appears today. I do not see any signs of active infection which is also great news. Nonetheless I think that we are definitely headed in the appropriate direction as far as healing is concerned and in fact this is significantly improved even as compared to just last week. Objective Constitutional Well-nourished and well-hydrated in no acute distress. Vitals Time Taken: 8:08 AM, Height: 60 in, Weight: 264 lbs, BMI: 51.6, Temperature: 98 F, Pulse: 52 bpm, Respiratory Rate: 18 breaths/min, Blood Pressure: 144/58 mmHg. Respiratory normal breathing without difficulty. Psychiatric this patient is able to make decisions and demonstrates good insight into disease process. Alert and Oriented x 3. pleasant and cooperative. General Notes: Upon inspection patient's wound bed actually showed signs of good granulation epithelization at this point. Fortunately there does not appear to be any signs of active infection which is great news and overall I am extremely pleased with where things stand today. I did not even have to perform any sharp debridement as the wound did appear to be doing so great today. I think that she is actually getting fairly close to healing. This is awesome  news. Integumentary (Hair, Skin) Wound #1 status is Open. Original cause of wound was Gradually Appeared. The date acquired was: 09/28/2020. The wound has been in treatment 1 weeks. The wound is located on the Left,Medial Lower Leg. The wound measures 1.5cm length x 3.5cm width x 0.1cm depth; 4.123cm^2 area and 0.412cm^3 volume. There is Fat Layer (Subcutaneous Tissue)  exposed. There is no tunneling or undermining noted. There is a medium amount of serosanguineous drainage noted. There is medium (34-66%) red granulation within the wound bed. There is a medium (34-66%) amount of necrotic tissue within the wound bed including Adherent Slough. Assessment Cerny, Thao R. ([id number]) Active Problems ICD-10 Lymphedema, not elsewhere classified Venous insufficiency (chronic) (peripheral) Type 2 diabetes mellitus with other skin ulcer Non-pressure chronic ulcer of other part of left lower leg with fat layer exposed Chronic kidney disease, stage 3 unspecified Chronic combined systolic (congestive) and diastolic (congestive) heart failure Bariatric surgery status Procedures Wound #1 Pre-procedure diagnosis of Wound #1 is a Diabetic Wound/Ulcer of the Lower Extremity located on the Left,Medial Lower Leg . There was a Four Layer Compression Therapy Procedure by Carlene Coria, RN. Post procedure Diagnosis Wound #1: Same as Pre-Procedure Plan Follow-up Appointments: Return Appointment in 1 week. Nurse Visit as needed - thursday or friday Bathing/ Shower/ Hygiene: May shower with wound dressing protected with water repellent cover or cast protector. Edema Control - Lymphedema / Segmental Compressive Device / Other: Optional: One layer of unna paste to top of compression wrap (to act as an anchor). Elevate, Exercise Daily and Avoid Standing for Long Periods of Time. Elevate legs to the level of the heart and pump ankles as often as possible Elevate leg(s) parallel to the floor when  sitting. WOUND #1: - Lower Leg Wound Laterality: Left, Medial Cleanser: Normal Saline 2 x Per Week/30 Days Discharge Instructions: Wash your hands with soap and water. Remove old dressing, discard into plastic bag and place into trash. Cleanse the wound with Normal Saline prior to applying a clean dressing using gauze sponges, not tissues or cotton balls. Do not scrub or use excessive force. Pat dry using gauze sponges, not tissue or cotton balls. Cleanser: Soap and Water 2 x Per Week/30 Days Discharge Instructions: Gently cleanse wound with antibacterial soap, rinse and pat dry prior to dressing wounds Primary Dressing: Silvercel 4 1/4x 4 1/4 (in/in) 2 x Per Week/30 Days Discharge Instructions: Apply Silvercel 4 1/4x 4 1/4 (in/in) as instructed Secondary Dressing: Zetuvit Plus Silicone Border Dressing 4x4 (in/in) 2 x Per Week/30 Days Compression Wrap: Medichoice 4 layer Compression System, 35-40 mmHG 2 x Per Week/30 Days Discharge Instructions: Apply multi-layer wrap as directed. 1. Would recommend currently that we going continue with the wound care measures as before and the patient is in agreement with the plan. This includes the use of the silver alginate dressing followed by Zetuvit to catch the excessive drainage. 2. I am also can recommend that we continue with the 4-layer compression wrap as it seems to be doing well as far as helping with edema control. We will see patient back for reevaluation in 1 week here in the clinic. If anything worsens or changes patient will contact our office for additional recommendations. Electronic Signature(s) Signed: 10/29/2020 9:24:30 AM By: Worthy Keeler PA-C Entered By: Worthy Keeler on 10/29/2020 09:24:30 Servello, [name] ([id number]) -------------------------------------------------------------------------------- SuperBill Details Patient Name: [name] Date of Service: 10/29/2020 Medical Record Number: [id number] Patient  Account Number: [id number] Date of Birth/Sex: 1966-01-22 (55 y.o. F) Treating RN: Carlene Coria Primary Care Provider: Iona Beard Other Clinician: Referring Provider: Iona Beard Treating Provider/Extender: Skipper Cliche in Treatment: 1 Diagnosis Coding ICD-10 Codes Code Description E11.622 Type 2 diabetes mellitus with other skin ulcer L97.822 Non-pressure chronic ulcer of other part of left lower leg with fat layer exposed N18.30 Chronic kidney disease, stage 3 unspecified  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.84 Bariatric surgery status Facility Procedures CPT4 Code: [id number] Description: (Facility Use Only) [phone] - [facility] LT LEG Modifier: Quantity: 1 Physician Procedures CPT4 Code: [id number] Description: 16244 - WC PHYS LEVEL 3 - EST PT Modifier: Quantity: 1 CPT4 Code: Description: ICD-10 Diagnosis Description E11.622 Type 2 diabetes mellitus with other skin ulcer L97.822 Non-pressure chronic ulcer of other part of left lower leg with fat lay N18.30 Chronic kidney disease, stage 3 unspecified I50.42 Chronic combined  systolic (congestive) and diastolic (congestive) heart Modifier: er exposed failure Quantity: Electronic Signature(s) Signed: 10/29/2020 9:37:57 AM By: Worthy Keeler PA-C Entered By: Worthy Keeler on 10/29/2020 09:37:55

## 2020-10-30 ENCOUNTER — Encounter (HOSPITAL_COMMUNITY): Payer: Self-pay

## 2020-10-30 ENCOUNTER — Other Ambulatory Visit: Payer: Self-pay

## 2020-10-30 ENCOUNTER — Emergency Department (HOSPITAL_COMMUNITY)
Admission: EM | Admit: 2020-10-30 | Discharge: 2020-10-30 | Disposition: A | Discharge status: Home / Self Care | Payer: Medicare Other | Attending: Emergency Medicine | Admitting: Emergency Medicine

## 2020-10-30 DIAGNOSIS — I10 Essential (primary) hypertension: Secondary | ICD-10-CM

## 2020-10-30 DIAGNOSIS — N183 Chronic kidney disease, stage 3 unspecified: Secondary | ICD-10-CM | POA: Insufficient documentation

## 2020-10-30 DIAGNOSIS — Z794 Long term (current) use of insulin: Secondary | ICD-10-CM | POA: Diagnosis not present

## 2020-10-30 DIAGNOSIS — E1122 Type 2 diabetes mellitus with diabetic chronic kidney disease: Secondary | ICD-10-CM | POA: Insufficient documentation

## 2020-10-30 DIAGNOSIS — E108 Type 1 diabetes mellitus with unspecified complications: Secondary | ICD-10-CM | POA: Diagnosis not present

## 2020-10-30 DIAGNOSIS — I130 Hypertensive heart and chronic kidney disease with heart failure and stage 1 through stage 4 chronic kidney disease, or unspecified chronic kidney disease: Secondary | ICD-10-CM | POA: Diagnosis not present

## 2020-10-30 DIAGNOSIS — I5032 Chronic diastolic (congestive) heart failure: Secondary | ICD-10-CM | POA: Diagnosis not present

## 2020-10-30 DIAGNOSIS — J4550 Severe persistent asthma, uncomplicated: Secondary | ICD-10-CM | POA: Diagnosis not present

## 2020-10-30 LAB — CBC WITH DIFFERENTIAL/PLATELET
Abs Immature Granulocytes: 0.01 10*3/uL (ref 0.00–0.07)
Basophils Absolute: 0 10*3/uL (ref 0.0–0.1)
Basophils Relative: 1 %
Eosinophils Absolute: 0.4 10*3/uL (ref 0.0–0.5)
Eosinophils Relative: 5 %
HCT: 36 % (ref 36.0–46.0)
Hemoglobin: 11 g/dL — ABNORMAL LOW (ref 12.0–15.0)
Immature Granulocytes: 0 %
Lymphocytes Relative: 35 %
Lymphs Abs: 2.7 10*3/uL (ref 0.7–4.0)
MCH: 27.4 pg (ref 26.0–34.0)
MCHC: 30.6 g/dL (ref 30.0–36.0)
MCV: 89.8 fL (ref 80.0–100.0)
Monocytes Absolute: 0.7 10*3/uL (ref 0.1–1.0)
Monocytes Relative: 9 %
Neutro Abs: 4 10*3/uL (ref 1.7–7.7)
Neutrophils Relative %: 50 %
Platelets: 173 10*3/uL (ref 150–400)
RBC: 4.01 MIL/uL (ref 3.87–5.11)
RDW: 14.2 % (ref 11.5–15.5)
WBC: 7.7 10*3/uL (ref 4.0–10.5)
nRBC: 0 % (ref 0.0–0.2)

## 2020-10-30 LAB — COMPREHENSIVE METABOLIC PANEL
ALT: 20 U/L (ref 0–44)
AST: 20 U/L (ref 15–41)
Albumin: 3.9 g/dL (ref 3.5–5.0)
Alkaline Phosphatase: 113 U/L (ref 38–126)
Anion gap: 7 (ref 5–15)
BUN: 46 mg/dL — ABNORMAL HIGH (ref 6–20)
CO2: 26 mmol/L (ref 22–32)
Calcium: 8.6 mg/dL — ABNORMAL LOW (ref 8.9–10.3)
Chloride: 105 mmol/L (ref 98–111)
Creatinine, Ser: 1.81 mg/dL — ABNORMAL HIGH (ref 0.44–1.00)
GFR, Estimated: 33 mL/min — ABNORMAL LOW (ref >60)
Glucose, Bld: 74 mg/dL (ref 70–99)
Potassium: 3.8 mmol/L (ref 3.5–5.1)
Sodium: 138 mmol/L (ref 135–145)
Total Bilirubin: 0.3 mg/dL (ref 0.3–1.2)
Total Protein: 7.7 g/dL (ref 6.5–8.1)

## 2020-10-30 NOTE — ED Notes (Signed)
Pt pushed call bell and reported to RN that her blood sugar was low. Pt wears a continuous blood glucose monitor and her reading was showing 54. Pt was given grape juice and a pack of peanut butter crackers. Pt assisted to side of bed to drink and eat. Will continue to monitor.

## 2020-10-30 NOTE — ED Triage Notes (Signed)
Pt presents to ED and states she takes her BP and HR everyday at home for her PCP and they told her her BP is high and HR is lower and she needs to come be evaluated. Pt states she "feels fine." Pt denies dizziness, headache.

## 2020-10-30 NOTE — ED Provider Notes (Signed)
Emergency Department Provider Note   I have reviewed the triage vital signs and the nursing notes.   HISTORY  Chief Complaint Hypertension   HPI [name] is a 55 y.o. female with past medical history reviewed below presents to the emergency department with elevated blood pressures at home.  Patient states that she has been monitoring her blood pressures with a system which transmits her readings to a nurse.  They alerted her that blood pressures are elevated into the 213Y and 865H systolic with heart rates in the 60s and some in the 50s.  Patient states she is been feeling well today.  She was frustrated earlier in the day because she was having some computer issues but otherwise was having no medical complaints.  She felt some tingling in the fingers of the right hand which lasted a few seconds but then resolved.  No numbness or weakness.  No speech or vision change.  No leg symptoms.  No chest pain.  She states that she was advised to present for further evaluation to the ED.    Past Medical History:  Diagnosis Date   Acute asthma exacerbation 12/21/2014   Acute renal failure ([place])    ARF (acute renal failure) ([place]) 05/03/2014   Asthma    Asthma exacerbation 05/03/2014   Asthma, severe persistent 05/03/2014   Chronic diastolic CHF (congestive heart failure) ([place]) 06/21/2018   Diabetes mellitus    Diabetes mellitus type 2 in obese ([place]) 05/03/2014   History of echocardiogram 09/4694   LVH, diastolic dysfunction   Hyperlipidemia    Hypertension    Hyponatremia 12/02/2014   Obesity    Preop examination 05/18/2017   SOB (shortness of breath) 12/22/2014    Patient Active Problem List   Diagnosis Date Noted   Chronic diastolic CHF (congestive heart failure) ([place]) 06/21/2018   Obesity 05/01/2018   Special screening for malignant neoplasms, colon    SOB (shortness of breath) 12/22/2014   Hyponatremia 12/02/2014   CKD (chronic kidney disease) stage 3, GFR 30-59 ml/min ([facility])  05/04/2014   Asthma exacerbation 05/03/2014   Diabetes mellitus type 2 in obese ([place]) 05/03/2014   Hypertension 05/03/2014   Hyperlipidemia 05/03/2014    Past Surgical History:  Procedure Laterality Date   CARDIAC CATHETERIZATION  03/2013   normal coronary arteries, EF 55%   CARDIAC CATHETERIZATION  03/2013   normal coronary arteries   CATARACT EXTRACTION W/PHACO Right 12/10/2012   Procedure: CATARACT EXTRACTION PHACO AND INTRAOCULAR LENS PLACEMENT ([place]);  Surgeon: Tonny Branch, MD;  Location: [facility];  Service: Ophthalmology;  Laterality: Right;  CDE:22..42   CATARACT EXTRACTION W/PHACO Left 05/11/2015   Procedure: CATARACT EXTRACTION PHACO AND INTRAOCULAR LENS PLACEMENT LEFT EYE CDE=6.54;  Surgeon: Tonny Branch, MD;  Location: [facility];  Service: Ophthalmology;  Laterality: Left;   COLONOSCOPY N/A 09/22/2017   Procedure: COLONOSCOPY;  Surgeon: Danie Binder, MD;  Location: [facility];  Service: Endoscopy;  Laterality: N/A;  12:15   EYE SURGERY Left    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  Family History  Problem Relation Age of Onset   Diabetes Mother    Asthma Other    Hypertension Other    Hypertension Sister     Social History Social History   Tobacco Use   Smoking status: Never   Smokeless tobacco: Never  Vaping Use   Vaping Use: Never used  Substance Use Topics   Alcohol use: Not Currently   Drug use: No    Review of  Systems  Constitutional: No fever/chills Eyes: No visual changes. ENT: No sore throat. Cardiovascular: Denies chest pain. Positive elevated BP.  Respiratory: Denies shortness of breath. Gastrointestinal: No abdominal pain. No nausea, no vomiting. No diarrhea. No constipation. Genitourinary: Negative for dysuria. Musculoskeletal: Negative for back pain. Skin: Negative for rash. Neurological: Negative for headaches, focal weakness or numbness.  10-point ROS otherwise negative.  ____________________________________________   PHYSICAL EXAM:  VITAL SIGNS: ED Triage Vitals [10/30/20 1653]  Enc Vitals Group     BP (!) 141/51     Pulse Rate (!) 57     Resp 18     Temp 98 F (36.7 C)     Temp Source Temporal     SpO2 99 %     Weight 264 lb (119.7 kg)     Height 5' (1.524 m)   Constitutional: Alert and oriented. Well appearing and in no acute distress. Eyes: Conjunctivae are normal.  Head: Atraumatic. Nose: No congestion/rhinnorhea. Mouth/Throat: Mucous membranes are moist.  Neck: No stridor.  Cardiovascular: Normal rate, regular rhythm. Good peripheral circulation. Grossly normal heart sounds.   Respiratory: Normal respiratory effort.  No retractions. Lungs CTAB. Gastrointestinal: Soft and nontender. No distention.  Musculoskeletal: No lower extremity tenderness. No gross deformities of extremities. Neurologic:  Normal speech and language. No gross focal neurologic deficits are appreciated.  Skin:  Skin is warm, dry and intact. No rash noted.  ____________________________________________   LABS (all labs ordered are listed, but only abnormal results are displayed)  Labs Reviewed  COMPREHENSIVE METABOLIC PANEL - Abnormal; Notable for the following components:      Result Value   BUN 46 (*)    Creatinine, Ser 1.81 (*)    Calcium 8.6 (*)    GFR, Estimated 33 (*)    All other components within normal limits  CBC WITH DIFFERENTIAL/PLATELET - Abnormal; Notable for the  following components:   Hemoglobin 11.0 (*)    All other components within normal limits   ____________________________________________  EKG   EKG Interpretation  Date/Time:  Friday October 30 2020 16:59:45 EDT Ventricular Rate:  54 PR Interval:  158 QRS Duration: 88 QT Interval:  424 QTC Calculation: 402 R Axis:   47 Text Interpretation: Sinus bradycardia Low voltage QRS Borderline ECG No change since Mar 2019 tracing Confirmed by Nanda Quinton [phone]) on 10/30/2020 5:06:50 PM        ____________________________________________  RADIOLOGY  None   ____________________________________________   PROCEDURES  Procedure(s) performed:   Procedures  None ____________________________________________   INITIAL IMPRESSION / ASSESSMENT AND PLAN / ED COURSE  Pertinent labs & imaging results that were available during my care of the patient were reviewed by me and considered in my medical decision making (see chart for details).   Patient presents emergency department with hypertension and some mild bradycardia.  Her EKG is similar to prior exams.  In review of outpatient encounters in epic she frequently has heart rates in the  43s and 60s.  Do not see a high-grade block to prompt emergent cardiology evaluation.  Will obtain screening blood work with elevated blood pressures but no findings on exam to strongly suspect hypertensive emergency.  Patient had some tingling in the fingers which was momentary earlier today and does not seem consistent with stroke.  She has an intact neurologic exam including strength and sensation throughout.   07:35 PM  Labs reviewed and reassuring. Plan for discharge with continued home monitoring and PCP follow up next week for BP re-check. Discussed ED return precautions and provided in writing at discharge.  ____________________________________________  FINAL CLINICAL IMPRESSION(S) / ED DIAGNOSES  Final diagnoses:  Primary hypertension      Note:  This document was prepared using Dragon voice recognition software and may include unintentional dictation errors.  Nanda Quinton, MD, [facility] Emergency Medicine    Mery Guadalupe, Wonda Olds, MD 10/30/20 [phone]

## 2020-10-30 NOTE — Discharge Instructions (Addendum)

## 2020-10-31 NOTE — Progress Notes (Signed)
DEAUNDRA, DUPRIEST ([id number]) Visit Report for 10/29/2020 Arrival Information Details Patient Name: [name], [name] Date of Service: 10/29/2020 8:00 AM Medical Record Number: [id number] Patient Account Number: [id number] Date of Birth/Sex: Nov 03, 1965 (55 y.o. F) Treating RN: Carlene Coria Primary Care [place] People: Iona Beard Other Clinician: Referring Elisheva Fallas: Iona Beard Treating Sheriann Newmann/Extender: Skipper Cliche in Treatment: 1 Visit Information History Since Last Visit All ordered tests and consults were completed: No Patient Arrived: Ambulatory Added or deleted any medications: No Arrival Time: 08:03 Any new allergies or adverse reactions: No Accompanied By: self Had a fall or experienced change in No Transfer Assistance: None activities of daily living that may affect Patient Identification Verified: Yes risk of falls: Secondary Verification Process Completed: Yes Signs or symptoms of abuse/neglect since last visito No Patient Requires Transmission-Based Precautions: No Hospitalized since last visit: No Patient Has Alerts: No Implantable device outside of the clinic excluding No cellular tissue based products placed in the center since last visit: Has Dressing in Place as Prescribed: Yes Has Compression in Place as Prescribed: Yes Pain Present Now: No Electronic Signature(s) Signed: 10/30/2020 7:59:11 PM By: Carlene Coria RN Entered By: Carlene Coria on 10/29/2020 08:08:37 Colla, [name] ([id number]) -------------------------------------------------------------------------------- Clinic Level of Care Assessment Details Patient Name: [name] Date of Service: 10/29/2020 8:00 AM Medical Record Number: [id number] Patient Account Number: [id number] Date of Birth/Sex: Jul 04, 1965 (55 y.o. F) Treating RN: Carlene Coria Primary Care Riese Hellard: Iona Beard Other Clinician: Referring Qusay Villada: Iona Beard Treating Kriti Katayama/Extender: Skipper Cliche  in Treatment: 1 Clinic Level of Care Assessment Items TOOL 1 Quantity Score []  - Use when EandM and Procedure is performed on INITIAL visit 0 ASSESSMENTS - Nursing Assessment / Reassessment []  - General Physical Exam (combine w/ comprehensive assessment (listed just below) when performed on new 0 pt. evals) []  - 0 Comprehensive Assessment (HX, ROS, Risk Assessments, Wounds Hx, etc.) ASSESSMENTS - Wound and Skin Assessment / Reassessment []  - Dermatologic / Skin Assessment (not related to wound area) 0 ASSESSMENTS - Ostomy and/or Continence Assessment and Care []  - Incontinence Assessment and Management 0 []  - 0 Ostomy Care Assessment and Management (repouching, etc.) PROCESS - Coordination of Care []  - Simple Patient / Family Education for ongoing care 0 []  - 0 Complex (extensive) Patient / Family Education for ongoing care []  - 0 Staff obtains Programmer, systems, Records, Test Results / Process Orders []  - 0 Staff telephones HHA, Nursing Homes / Clarify orders / etc []  - 0 Routine Transfer to another Facility (non-emergent condition) []  - 0 Routine Hospital Admission (non-emergent condition) []  - 0 New Admissions / Biomedical engineer / Ordering NPWT, Apligraf, etc. []  - 0 Emergency Hospital Admission (emergent condition) PROCESS - Special Needs []  - Pediatric / Minor Patient Management 0 []  - 0 Isolation Patient Management []  - 0 Hearing / Language / Visual special needs []  - 0 Assessment of Community assistance (transportation, D/C planning, etc.) []  - 0 Additional assistance / Altered mentation []  - 0 Support Surface(s) Assessment (bed, cushion, seat, etc.) INTERVENTIONS - Miscellaneous []  - External ear exam 0 []  - 0 Patient Transfer (multiple staff / Civil Service fast streamer / Similar devices) []  - 0 Simple Staple / Suture removal (25 or less) []  - 0 Complex Staple / Suture removal (26 or more) []  - 0 Hypo/Hyperglycemic Management (do not check if billed separately) []  -  0 Ankle / Brachial Index (ABI) - do not check if billed separately Has the patient been seen at the hospital within the last  three years: Yes Total Score: 0 Level Of Care: ____ [name] ([id number]) Electronic Signature(s) Signed: 10/30/2020 7:59:11 PM By: Carlene Coria RN Entered By: Carlene Coria on 10/29/2020 08:23:20 Spidle, [name] ([id number]) -------------------------------------------------------------------------------- Compression Therapy Details Patient Name: [name] Date of Service: 10/29/2020 8:00 AM Medical Record Number: [id number] Patient Account Number: [id number] Date of Birth/Sex: 01-21-1966 (55 y.o. F) Treating RN: Carlene Coria Primary Care Kyuss Hale: Iona Beard Other Clinician: Referring Jamerica Snavely: Iona Beard Treating Rykin Route/Extender: Skipper Cliche in Treatment: 1 Compression Therapy Performed for Wound Assessment: Wound #1 Left,Medial Lower Leg Performed By: Clinician Carlene Coria, RN Compression Type: Four Layer Post Procedure Diagnosis Same as Pre-procedure Electronic Signature(s) Signed: 10/30/2020 7:59:11 PM By: Carlene Coria RN Entered By: Carlene Coria on 10/29/2020 08:23:09 Deady, [name] ([id number]) -------------------------------------------------------------------------------- Encounter Discharge Information Details Patient Name: [name] Date of Service: 10/29/2020 8:00 AM Medical Record Number: [id number] Patient Account Number: [id number] Date of Birth/Sex: [birth year] (55 y.o. F) Treating RN: Carlene Coria Primary Care Guynell Kleiber: Iona Beard Other Clinician: Referring Nitika Jackowski: Iona Beard Treating Lucah Petta/Extender: Skipper Cliche in Treatment: 1 Encounter Discharge Information Items Discharge Condition: Stable Ambulatory Status: Ambulatory Discharge Destination: Home Transportation: Private Auto Accompanied By: self Schedule Follow-up Appointment: Yes Clinical Summary of Care:  Patient Declined Electronic Signature(s) Signed: 10/30/2020 7:59:11 PM By: Carlene Coria RN Entered By: Carlene Coria on 10/29/2020 08:26:21 Therriault, [name] ([id number]) -------------------------------------------------------------------------------- Lower Extremity Assessment Details Patient Name: [name] Date of Service: 10/29/2020 8:00 AM Medical Record Number: [id number] Patient Account Number: [id number] Date of Birth/Sex: [birth year] (55 y.o. F) Treating RN: Carlene Coria Primary Care Ajanae Virag: Iona Beard Other Clinician: Referring Mekenzie Modeste: Iona Beard Treating Wilmore Holsomback/Extender: Skipper Cliche in Treatment: 1 Edema Assessment Assessed: [Left: No] [Right: No] Edema: [Left: Ye] [Right: s] Calf Left: Right: Point of Measurement: 33 cm From Medial Instep 50 cm Ankle Left: Right: Point of Measurement: 10 cm From Medial Instep 30 cm Knee To Floor Left: Right: From Medial Instep 43 cm Vascular Assessment Pulses: Dorsalis Pedis Palpable: [Left:Yes] Electronic Signature(s) Signed: 10/30/2020 7:59:11 PM By: Carlene Coria RN Entered By: Carlene Coria on 10/29/2020 08:18:02 Ankney, [name] ([id number]) -------------------------------------------------------------------------------- Multi Wound Chart Details Patient Name: [name] Date of Service: 10/29/2020 8:00 AM Medical Record Number: [id number] Patient Account Number: [id number] Date of Birth/Sex: [birth year] (55 y.o. F) Treating RN: Carlene Coria Primary Care Rashaun Curl: Iona Beard Other Clinician: Referring Matteson Blue: Iona Beard Treating Joachim Carton/Extender: Skipper Cliche in Treatment: 1 Vital Signs Height(in): 60 Pulse(bpm): 52 Weight(lbs): 264 Blood Pressure(mmHg): 144/58 Body Mass Index(BMI): 52 Temperature(F): 98 Respiratory Rate(breaths/min): 18 Photos: [N/A:N/A] Wound Location: Left, Medial Lower Leg N/A N/A Wounding Event: Gradually Appeared N/A N/A Primary Etiology:  Diabetic Wound/Ulcer of the Lower N/A N/A Extremity Comorbid History: Congestive Heart Failure, N/A N/A Hypertension, Type II Diabetes Date Acquired: 09/28/2020 N/A N/A Weeks of Treatment: 1 N/A N/A Wound Status: Open N/A N/A Measurements L x W x D (cm) 1.5x3.5x0.1 N/A N/A Area (cm) : 4.123 N/A N/A Volume (cm) : 0.412 N/A N/A % Reduction in Area: -59.10% N/A N/A % Reduction in Volume: -59.10% N/A N/A Classification: Grade 2 N/A N/A Exudate Amount: Medium N/A N/A Exudate Type: Serosanguineous N/A N/A Exudate Color: red, brown N/A N/A Granulation Amount: Medium (34-66%) N/A N/A Granulation Quality: Red N/A N/A Necrotic Amount: Medium (34-66%) N/A N/A Exposed Structures: Fat Layer (Subcutaneous Tissue): N/A N/A Yes Fascia: No Tendon: No Muscle: No Joint: No Bone: No Epithelialization: None N/A N/A Treatment Notes Electronic Signature(s)  Signed: 10/30/2020 7:59:11 PM By: Carlene Coria RN Entered By: Carlene Coria on 10/29/2020 08:20:06 Nagy, [name] ([id number]) -------------------------------------------------------------------------------- [place] Details Patient Name: [name] Date of Service: 10/29/2020 8:00 AM Medical Record Number: [id number] Patient Account Number: [id number] Date of Birth/Sex: [birth year] (55 y.o. F) Treating RN: Carlene Coria Primary Care Vuong Musa: Iona Beard Other Clinician: Referring Miriah Maruyama: Iona Beard Treating Rickey Farrier/Extender: Skipper Cliche in Treatment: 1 Active Inactive Wound/Skin Impairment Nursing Diagnoses: Knowledge deficit related to ulceration/compromised skin integrity Goals: Patient/caregiver will verbalize understanding of skin care regimen Date Initiated: 10/20/2020 Target Resolution Date: 11/20/2020 Goal Status: Active Ulcer/skin breakdown will have a volume reduction of 30% by week 4 Date Initiated: 10/20/2020 Target Resolution Date: 11/20/2020 Goal Status: Active Ulcer/skin  breakdown will have a volume reduction of 50% by week 8 Date Initiated: 10/20/2020 Target Resolution Date: 12/20/2020 Goal Status: Active Ulcer/skin breakdown will have a volume reduction of 80% by week 12 Date Initiated: 10/20/2020 Target Resolution Date: 01/20/2021 Goal Status: Active Ulcer/skin breakdown will heal within 14 weeks Date Initiated: 10/20/2020 Target Resolution Date: 02/19/2021 Goal Status: Active Interventions: Assess patient/caregiver ability to obtain necessary supplies Assess patient/caregiver ability to perform ulcer/skin care regimen upon admission and as needed Assess ulceration(s) every visit Notes: Electronic Signature(s) Signed: 10/30/2020 7:59:11 PM By: Carlene Coria RN Entered By: Carlene Coria on 10/29/2020 08:19:57 Emma, [name] ([id number]) -------------------------------------------------------------------------------- Pain Assessment Details Patient Name: [name] Date of Service: 10/29/2020 8:00 AM Medical Record Number: [id number] Patient Account Number: [id number] Date of Birth/Sex: Jan 06, 1966 (55 y.o. F) Treating RN: Carlene Coria Primary Care Jennie Bolar: Iona Beard Other Clinician: Referring Domenica Weightman: Iona Beard Treating Isrrael Fluckiger/Extender: Skipper Cliche in Treatment: 1 Active Problems Location of Pain Severity and Description of Pain Patient Has Paino No Site Locations Pain Management and Medication Current Pain Management: Electronic Signature(s) Signed: 10/30/2020 7:59:11 PM By: Carlene Coria RN Entered By: Carlene Coria on 10/29/2020 08:09:13 Shearer, [name] ([id number]) -------------------------------------------------------------------------------- Patient/Caregiver Education Details Patient Name: [name] Date of Service: 10/29/2020 8:00 AM Medical Record Number: [id number] Patient Account Number: [id number] Date of Birth/Gender: 08/13/1965 (55 y.o. F) Treating RN: Carlene Coria Primary Care  Physician: Iona Beard Other Clinician: Referring Physician: Iona Beard Treating Physician/Extender: Skipper Cliche in Treatment: 1 Education Assessment Education Provided To: Patient Education Topics Provided Wound/Skin Impairment: Methods: Printed Responses: State content correctly Electronic Signature(s) Signed: 10/30/2020 7:59:11 PM By: Carlene Coria RN Entered By: Carlene Coria on 10/29/2020 08:23:43 Droessler, [name] ([id number]) -------------------------------------------------------------------------------- Wound Assessment Details Patient Name: [name] Date of Service: 10/29/2020 8:00 AM Medical Record Number: [id number] Patient Account Number: [id number] Date of Birth/Sex: 1965-04-06 (55 y.o. F) Treating RN: Carlene Coria Primary Care Leslyn Monda: Iona Beard Other Clinician: Referring Kesia Dalto: Iona Beard Treating Noboru Bidinger/Extender: Skipper Cliche in Treatment: 1 Wound Status Wound Number: 1 Primary Etiology: Diabetic Wound/Ulcer of the Lower Extremity Wound Location: Left, Medial Lower Leg Wound Status: Open Wounding Event: Gradually Appeared Comorbid Congestive Heart Failure, Hypertension, Type II History: Diabetes Date Acquired: 09/28/2020 Weeks Of Treatment: 1 Clustered Wound: No Photos Wound Measurements Length: (cm) 1.5 Width: (cm) 3.5 Depth: (cm) 0.1 Area: (cm) 4.123 Volume: (cm) 0.412 % Reduction in Area: -59.1% % Reduction in Volume: -59.1% Epithelialization: None Tunneling: No Undermining: No Wound Description Classification: Grade 2 Exudate Amount: Medium Exudate Type: Serosanguineous Exudate Color: red, brown Foul Odor After Cleansing: No Slough/Fibrino Yes Wound Bed Granulation Amount: Medium (34-66%) Exposed Structure Granulation Quality: Red Fascia Exposed: No Necrotic Amount: Medium (34-66%) Fat Layer (Subcutaneous  Tissue) Exposed: Yes Necrotic Quality: Adherent Slough Tendon Exposed: No Muscle Exposed:  No Joint Exposed: No Bone Exposed: No Treatment Notes Wound #1 (Lower Leg) Wound Laterality: Left, Medial Cleanser Normal Saline Discharge Instruction: Wash your hands with soap and water. Remove old dressing, discard into plastic bag and place into trash. Cleanse the wound with Normal Saline prior to applying a clean dressing using gauze sponges, not tissues or cotton balls. Do not scrub or use excessive force. Pat dry using gauze sponges, not tissue or cotton balls. Soap and Water [place], AKILA BATTA ([id number]) Discharge Instruction: Gently cleanse wound with antibacterial soap, rinse and pat dry prior to dressing wounds Peri-Wound Care Topical Primary Dressing Silvercel 4 1/4x 4 1/4 (in/in) Discharge Instruction: Apply Silvercel 4 1/4x 4 1/4 (in/in) as instructed Secondary Dressing Zetuvit Plus Silicone Border Dressing 4x4 (in/in) Secured With Compression Wrap Medichoice 4 layer Compression System, 35-40 mmHG Discharge Instruction: Apply multi-layer wrap as directed. Compression Stockings Add-Ons Electronic Signature(s) Signed: 10/30/2020 7:59:11 PM By: Carlene Coria RN Entered By: Carlene Coria on 10/29/2020 08:16:51 [place]road, [name] ([id number]) -------------------------------------------------------------------------------- Vitals Details Patient Name: [name] Date of Service: 10/29/2020 8:00 AM Medical Record Number: [id number] Patient Account Number: [id number] Date of Birth/Sex: [birth year] (55 y.o. F) Treating RN: Carlene Coria Primary Care Cai Anfinson: Iona Beard Other Clinician: Referring Olufemi Mofield: Iona Beard Treating Acheron Sugg/Extender: Skipper Cliche in Treatment: 1 Vital Signs Time Taken: 08:08 Temperature (F): 98 Height (in): 60 Pulse (bpm): 52 Weight (lbs): 264 Respiratory Rate (breaths/min): 18 Body Mass Index (BMI): 51.6 Blood Pressure (mmHg): 144/58 Reference Range: 80 - 120 mg / dl Electronic Signature(s) Signed: 10/30/2020  7:59:11 PM By: Carlene Coria RN Entered By: Carlene Coria on 10/29/2020 08:09:03

## 2020-11-05 ENCOUNTER — Encounter: Payer: Medicare Other | Admitting: Physician Assistant

## 2020-11-06 ENCOUNTER — Encounter: Payer: Medicare Other | Admitting: Physician Assistant

## 2020-11-06 ENCOUNTER — Other Ambulatory Visit: Payer: Self-pay

## 2020-11-06 DIAGNOSIS — I130 Hypertensive heart and chronic kidney disease with heart failure and stage 1 through stage 4 chronic kidney disease, or unspecified chronic kidney disease: Secondary | ICD-10-CM | POA: Diagnosis not present

## 2020-11-06 DIAGNOSIS — E1122 Type 2 diabetes mellitus with diabetic chronic kidney disease: Secondary | ICD-10-CM | POA: Diagnosis not present

## 2020-11-06 DIAGNOSIS — L97822 Non-pressure chronic ulcer of other part of left lower leg with fat layer exposed: Secondary | ICD-10-CM | POA: Diagnosis not present

## 2020-11-06 DIAGNOSIS — E11622 Type 2 diabetes mellitus with other skin ulcer: Secondary | ICD-10-CM | POA: Diagnosis not present

## 2020-11-06 DIAGNOSIS — Z9884 Bariatric surgery status: Secondary | ICD-10-CM | POA: Diagnosis not present

## 2020-11-06 DIAGNOSIS — E1151 Type 2 diabetes mellitus with diabetic peripheral angiopathy without gangrene: Secondary | ICD-10-CM | POA: Diagnosis not present

## 2020-11-06 DIAGNOSIS — N183 Chronic kidney disease, stage 3 unspecified: Secondary | ICD-10-CM | POA: Diagnosis not present

## 2020-11-06 DIAGNOSIS — I5042 Chronic combined systolic (congestive) and diastolic (congestive) heart failure: Secondary | ICD-10-CM | POA: Diagnosis not present

## 2020-11-06 DIAGNOSIS — I890 Lymphedema, not elsewhere classified: Secondary | ICD-10-CM | POA: Diagnosis not present

## 2020-11-06 DIAGNOSIS — I872 Venous insufficiency (chronic) (peripheral): Secondary | ICD-10-CM | POA: Diagnosis not present

## 2020-11-06 NOTE — Progress Notes (Addendum)
[name] ([id number]) Visit Report for 11/06/2020 Chief Complaint Document Details Patient Name: [name] Date of Service: 11/06/2020 8:45 AM Medical Record Number: [id number] Patient Account Number: [id number] Date of Birth/Sex: [birth year] (55 y.o. F) Treating RN: Carlene Coria Primary Care Provider: Iona Beard Other Clinician: Referring Provider: Iona Beard Treating Provider/Extender: Skipper Cliche in Treatment: 2 Information Obtained from: Patient Chief Complaint Left LE Ulcer Electronic Signature(s) Signed: 11/06/2020 9:23:13 AM By: Worthy Keeler PA-C Entered By: Worthy Keeler on 11/06/2020 09:23:13 [name], [name] ([id number]) -------------------------------------------------------------------------------- HPI Details Patient Name: [name] Date of Service: 11/06/2020 8:45 AM Medical Record Number: [id number] Patient Account Number: [id number] Date of Birth/Sex: [birth year] (55 y.o. F) Treating RN: Carlene Coria Primary Care Provider: Iona Beard Other Clinician: Referring Provider: Iona Beard Treating Provider/Extender: Skipper Cliche in Treatment: 2 History of Present Illness HPI Description: 10/20/2020 upon evaluation today patient appears to be doing somewhat poorly in regard to her left leg where she has a significant wound although not very deep but this is definitely due to her chronic swelling. Fortunately there does not appear to be any signs of active infection at this time which is great news. There was a lot of scabbing going on and then she had areas that were somewhat open up underneath. It does appear to be very lymphedema like in nature. With that being said I do not see any signs of active infection currently. I do think that based on what we are seeing at the moment this is likely an area that will respond well to appropriate compression therapy. If it does not then we will need to reevaluate the situation for  sure. The patient does have a history of diabetes mellitus type 2, chronic venous insufficiency, lymphedema, chronic kidney disease stage III, and congestive heart failure. She is status post bariatric surgery as well. Overall I am pleased with where things stand I think she does need to definitely have compression in place. This wound has been present for about 4 to 6 weeks she tells me. 10/29/2020 upon evaluation today patient actually appears to be doing much better. She has been tolerating the dressing changes without complication and the compression wrap did slide down her a bit but to be perfectly honest I am very pleased overall with well the wound appears today. I do not see any signs of active infection which is also great news. Nonetheless I think that we are definitely headed in the appropriate direction as far as healing is concerned and in fact this is significantly improved even as compared to just last week. 11/06/2020 upon evaluation today patient appears to be doing well with regard to her wounds. In fact everything appears to be completely healed which is great news. I am extremely pleased with where things stand today. No fevers, chills, nausea, vomiting, or diarrhea. Electronic Signature(s) Signed: 11/06/2020 9:44:36 AM By: Worthy Keeler PA-C Entered By: Worthy Keeler on 11/06/2020 09:44:35 [name], [name] ([id number]) -------------------------------------------------------------------------------- Physical Exam Details Patient Name: [name] Date of Service: 11/06/2020 8:45 AM Medical Record Number: [id number] Patient Account Number: [id number] Date of Birth/Sex: 1965/07/19 (55 y.o. F) Treating RN: Carlene Coria Primary Care Provider: Iona Beard Other Clinician: Referring Provider: Iona Beard Treating Provider/Extender: Skipper Cliche in Treatment: 2 Constitutional Well-nourished and well-hydrated in no acute distress. Respiratory normal breathing  without difficulty. Psychiatric this patient is able to make decisions and demonstrates good insight into disease process. Alert and Oriented x  3. pleasant and cooperative. Notes Upon inspection patient's wound signs of excellent epithelization at this point everything appears to be completely closed which I am grateful for. The patient is extremely happy. Fortunately I think that she did well with the compression she is going to need to see about getting her some compression socks long-term. Electronic Signature(s) Signed: 11/06/2020 9:44:54 AM By: Worthy Keeler PA-C Entered By: Worthy Keeler on 11/06/2020 09:44:54 [name], [name] ([id number]) -------------------------------------------------------------------------------- Physician Orders Details Patient Name: [name] Date of Service: 11/06/2020 8:45 AM Medical Record Number: [id number] Patient Account Number: [id number] Date of Birth/Sex: October 12, 1965 (55 y.o. F) Treating RN: Carlene Coria Primary Care Provider: Iona Beard Other Clinician: Referring Provider: Iona Beard Treating Provider/Extender: Skipper Cliche in Treatment: 2 Verbal / Phone Orders: No Diagnosis Coding ICD-10 Coding Code Description I89.0 Lymphedema, not elsewhere classified I87.2 Venous insufficiency (chronic) (peripheral) E11.622 Type 2 diabetes mellitus with other skin ulcer L97.822 Non-pressure chronic ulcer of other part of left lower leg with fat layer exposed N18.30 Chronic kidney disease, stage 3 unspecified 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.84 Bariatric surgery status Discharge From [facility] Services o Discharge from [place] [facility] Complete o Wear compression garments daily. Put garments on first thing when you wake up and remove them before bed. o Moisturize legs daily after removing compression garments. o Elevate, Exercise Daily and Avoid Standing for  Long Periods of Time. Wound Treatment Electronic Signature(s) Signed: 11/06/2020 11:43:44 AM By: Worthy Keeler PA-C Signed: 11/11/2020 7:54:12 AM By: Carlene Coria RN Entered By: Carlene Coria on 11/06/2020 09:27:14 [name], [name] ([id number]) -------------------------------------------------------------------------------- Problem List Details Patient Name: [name] Date of Service: 11/06/2020 8:45 AM Medical Record Number: [id number] Patient Account Number: [id number] Date of Birth/Sex: 15-Jan-1966 (55 y.o. F) Treating RN: Carlene Coria Primary Care Provider: Iona Beard Other Clinician: Referring Provider: Iona Beard Treating Provider/Extender: Skipper Cliche in Treatment: 2 Active Problems ICD-10 Encounter Code Description Active Date MDM Diagnosis I89.0 Lymphedema, not elsewhere classified 10/20/2020 No Yes I87.2 Venous insufficiency (chronic) (peripheral) 10/20/2020 No Yes E11.622 Type 2 diabetes mellitus with other skin ulcer 10/20/2020 No Yes L97.822 Non-pressure chronic ulcer of other part of left lower leg with fat layer 10/20/2020 No Yes exposed N18.30 Chronic kidney disease, stage 3 unspecified 10/20/2020 No Yes I50.42 Chronic combined systolic (congestive) and diastolic (congestive) heart 10/20/2020 No Yes failure Z98.84 Bariatric surgery status 10/20/2020 No Yes Inactive Problems Resolved Problems Electronic Signature(s) Signed: 11/06/2020 9:23:02 AM By: Worthy Keeler PA-C Entered By: Worthy Keeler on 11/06/2020 09:23:02 [name], [name] ([id number]) -------------------------------------------------------------------------------- Progress Note Details Patient Name: [name] Date of Service: 11/06/2020 8:45 AM Medical Record Number: [id number] Patient Account Number: [id number] Date of Birth/Sex: 1966/01/24 (55 y.o. F) Treating RN: Carlene Coria Primary Care Provider: Iona Beard Other Clinician: Referring Provider: Iona Beard Treating Provider/Extender: Skipper Cliche in Treatment: 2 Subjective Chief Complaint Information obtained from Patient Left LE Ulcer History of Present Illness (HPI) 10/20/2020 upon evaluation today patient appears to be doing somewhat poorly in regard to her left leg where she has a significant wound although not very deep but this is definitely due to her chronic swelling. Fortunately there does not appear to be any signs of active infection at this time which is great news. There was a lot of scabbing going on and then she had areas that were somewhat open up underneath. It does appear to be very lymphedema like in  nature. With that being said I do not see any signs of active infection currently. I do think that based on what we are seeing at the moment this is likely an area that will respond well to appropriate compression therapy. If it does not then we will need to reevaluate the situation for sure. The patient does have a history of diabetes mellitus type 2, chronic venous insufficiency, lymphedema, chronic kidney disease stage III, and congestive heart failure. She is status post bariatric surgery as well. Overall I am pleased with where things stand I think she does need to definitely have compression in place. This wound has been present for about 4 to 6 weeks she tells me. 10/29/2020 upon evaluation today patient actually appears to be doing much better. She has been tolerating the dressing changes without complication and the compression wrap did slide down her a bit but to be perfectly honest I am very pleased overall with well the wound appears today. I do not see any signs of active infection which is also great news. Nonetheless I think that we are definitely headed in the appropriate direction as far as healing is concerned and in fact this is significantly improved even as compared to just last week. 11/06/2020 upon evaluation today patient appears to be doing well with  regard to her wounds. In fact everything appears to be completely healed which is great news. I am extremely pleased with where things stand today. No fevers, chills, nausea, vomiting, or diarrhea. Objective Constitutional Well-nourished and well-hydrated in no acute distress. Vitals Time Taken: 9:01 AM, Height: 60 in, Weight: 264 lbs, BMI: 51.6, Temperature: 98.3 F, Pulse: 51 bpm, Respiratory Rate: 18 breaths/min, Blood Pressure: 147/83 mmHg. Respiratory normal breathing without difficulty. Psychiatric this patient is able to make decisions and demonstrates good insight into disease process. Alert and Oriented x 3. pleasant and cooperative. General Notes: Upon inspection patient's wound signs of excellent epithelization at this point everything appears to be completely closed which I am grateful for. The patient is extremely happy. Fortunately I think that she did well with the compression she is going to need to see about getting her some compression socks long-term. Integumentary (Hair, Skin) Wound #1 status is Open. Original cause of wound was Gradually Appeared. The date acquired was: 09/28/2020. The wound has been in treatment 2 weeks. The wound is located on the Left,Medial Lower Leg. The wound measures 0cm length x 0cm width x 0cm depth; 0cm^2 area and 0cm^3 volume. There is no tunneling or undermining noted. There is a none present amount of drainage noted. There is no granulation within the wound bed. There is no necrotic tissue within the wound bed. Assessment [name], Lori STEINKAMP ([id number]) Active Problems ICD-10 Lymphedema, not elsewhere classified Venous insufficiency (chronic) (peripheral) Type 2 diabetes mellitus with other skin ulcer Non-pressure chronic ulcer of other part of left lower leg with fat layer exposed Chronic kidney disease, stage 3 unspecified Chronic combined systolic (congestive) and diastolic (congestive) heart failure Bariatric surgery  status Plan Discharge From [facility] Services: Discharge from [place] [facility] Complete Wear compression garments daily. Put garments on first thing when you wake up and remove them before bed. Moisturize legs daily after removing compression garments. Elevate, Exercise Daily and Avoid Standing for Long Periods of Time. 1. Would recommend currently that we going to continue with the wound care measures as before and the patient is in agreement with plan this includes the use of the compression therapy I do think  getting some compression socks will be the ideal way to go. 2. I am also can recommend at this time based on what we are seeing that we go ahead and have the patient continue to monitor for any signs of worsening from the standpoint of drainage or infection if she develops any issues she should let us know soon as possible. We will see the patient back for follow-up visit as needed. Electronic Signature(s) Signed: 11/06/2020 9:45:44 AM By: Worthy Keeler PA-C Previous Signature: 11/06/2020 9:45:33 AM Version By: Worthy Keeler PA-C Entered By: Worthy Keeler on 11/06/2020 09:45:44 [name], [name] ([id number]) -------------------------------------------------------------------------------- SuperBill Details Patient Name: [name] Date of Service: 11/06/2020 Medical Record Number: [id number] Patient Account Number: [id number] Date of Birth/Sex: [birth year] (55 y.o. F) Treating RN: Carlene Coria Primary Care Provider: Iona Beard Other Clinician: Referring Provider: Iona Beard Treating Provider/Extender: Skipper Cliche in Treatment: 2 Diagnosis Coding ICD-10 Codes Code Description I89.0 Lymphedema, not elsewhere classified I87.2 Venous insufficiency (chronic) (peripheral) E11.622 Type 2 diabetes mellitus with other skin ulcer L97.822 Non-pressure chronic ulcer of other part of left lower leg with fat layer exposed N18.30 Chronic kidney disease, stage 3  unspecified 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.84 Bariatric surgery status Facility Procedures CPT4 Code: [id number] Description: [id number] - WOUND CARE VISIT-LEV 2 EST PT Modifier: Quantity: 1 Physician Procedures CPT4 Code: [id number] Description: 99213 - WC PHYS LEVEL 3 - EST PT Modifier: Quantity: 1 CPT4 Code: Description: ICD-10 Diagnosis Description I89.0 Lymphedema, not elsewhere classified I87.2 Venous insufficiency (chronic) (peripheral) E11.622 Type 2 diabetes mellitus with other skin ulcer L97.822 Non-pressure chronic ulcer of other part of left lower  leg with fat lay Modifier: er exposed Quantity: Electronic Signature(s) Signed: 11/06/2020 9:45:57 AM By: Worthy Keeler PA-C Entered By: Worthy Keeler on 11/06/2020 09:45:56

## 2020-11-11 NOTE — Progress Notes (Signed)
Lori, [name] ([id number]) Visit Report for 11/06/2020 Arrival Information Details Patient Name: [name], [name] Date of Service: 11/06/2020 8:45 AM Medical Record Number: [id number] Patient Account Number: [id number] Date of Birth/Sex: 02/02/1966 (55 y.o. F) Treating RN: Carlene Coria Primary Care Nesbit Michon: Iona Beard Other Clinician: Referring Aubreyanna Dorrough: Iona Beard Treating Soriah Leeman/Extender: Skipper Cliche in Treatment: 2 Visit Information History Since Last Visit All ordered tests and consults were completed: No Patient Arrived: Ambulatory Added or deleted any medications: No Arrival Time: 08:56 Any new allergies or adverse reactions: No Accompanied By: self Had a fall or experienced change in No Transfer Assistance: None activities of daily living that may affect Patient Identification Verified: Yes risk of falls: Secondary Verification Process Completed: Yes Signs or symptoms of abuse/neglect since last visito No Patient Requires Transmission-Based Precautions: No Hospitalized since last visit: No Patient Has Alerts: No Implantable device outside of the clinic excluding No cellular tissue based products placed in the center since last visit: Has Dressing in Place as Prescribed: Yes Has Compression in Place as Prescribed: Yes Pain Present Now: No Electronic Signature(s) Signed: 11/11/2020 7:54:12 AM By: Carlene Coria RN Entered By: Carlene Coria on 11/06/2020 09:01:03 Wiedman, [name] ([id number]) -------------------------------------------------------------------------------- Clinic Level of Care Assessment Details Patient Name: [name] Date of Service: 11/06/2020 8:45 AM Medical Record Number: [id number] Patient Account Number: [id number] Date of Birth/Sex: 10-Feb-1966 (55 y.o. F) Treating RN: Carlene Coria Primary Care Annita Ratliff: Iona Beard Other Clinician: Referring Jacquline Terrill: Iona Beard Treating Marylee Belzer/Extender: Skipper Cliche  in Treatment: 2 Clinic Level of Care Assessment Items TOOL 4 Quantity Score X - Use when only an EandM is performed on FOLLOW-UP visit 1 0 ASSESSMENTS - Nursing Assessment / Reassessment X - Reassessment of Co-morbidities (includes updates in patient status) 1 10 X- 1 5 Reassessment of Adherence to Treatment Plan ASSESSMENTS - Wound and Skin Assessment / Reassessment X - Simple Wound Assessment / Reassessment - one wound 1 5 []  - 0 Complex Wound Assessment / Reassessment - multiple wounds []  - 0 Dermatologic / Skin Assessment (not related to wound area) ASSESSMENTS - Focused Assessment []  - Circumferential Edema Measurements - multi extremities 0 []  - 0 Nutritional Assessment / Counseling / Intervention []  - 0 Lower Extremity Assessment (monofilament, tuning fork, pulses) []  - 0 Peripheral Arterial Disease Assessment (using hand held doppler) ASSESSMENTS - Ostomy and/or Continence Assessment and Care []  - Incontinence Assessment and Management 0 []  - 0 Ostomy Care Assessment and Management (repouching, etc.) PROCESS - Coordination of Care X - Simple Patient / Family Education for ongoing care 1 15 []  - 0 Complex (extensive) Patient / Family Education for ongoing care []  - 0 Staff obtains Programmer, systems, Records, Test Results / Process Orders []  - 0 Staff telephones HHA, Nursing Homes / Clarify orders / etc []  - 0 Routine Transfer to another Facility (non-emergent condition) []  - 0 Routine Hospital Admission (non-emergent condition) []  - 0 New Admissions / Biomedical engineer / Ordering NPWT, Apligraf, etc. []  - 0 Emergency Hospital Admission (emergent condition) X- 1 10 Simple Discharge Coordination []  - 0 Complex (extensive) Discharge Coordination PROCESS - Special Needs []  - Pediatric / Minor Patient Management 0 []  - 0 Isolation Patient Management []  - 0 Hearing / Language / Visual special needs []  - 0 Assessment of Community assistance (transportation, D/C  planning, etc.) []  - 0 Additional assistance / Altered mentation []  - 0 Support Surface(s) Assessment (bed, cushion, seat, etc.) INTERVENTIONS - Wound Cleansing / Measurement Gradilla, Tyan R. ([id number])  X- 1 5 Simple Wound Cleansing - one wound []  - 0 Complex Wound Cleansing - multiple wounds X- 1 5 Wound Imaging (photographs - any number of wounds) []  - 0 Wound Tracing (instead of photographs) X- 1 5 Simple Wound Measurement - one wound []  - 0 Complex Wound Measurement - multiple wounds INTERVENTIONS - Wound Dressings []  - Small Wound Dressing one or multiple wounds 0 []  - 0 Medium Wound Dressing one or multiple wounds []  - 0 Large Wound Dressing one or multiple wounds []  - 0 Application of Medications - topical []  - 0 Application of Medications - injection INTERVENTIONS - Miscellaneous []  - External ear exam 0 []  - 0 Specimen Collection (cultures, biopsies, blood, body fluids, etc.) []  - 0 Specimen(s) / Culture(s) sent or taken to Lab for analysis []  - 0 Patient Transfer (multiple staff / Civil Service fast streamer / Similar devices) []  - 0 Simple Staple / Suture removal (25 or less) []  - 0 Complex Staple / Suture removal (26 or more) []  - 0 Hypo / Hyperglycemic Management (close monitor of Blood Glucose) []  - 0 Ankle / Brachial Index (ABI) - do not check if billed separately X- 1 5 Vital Signs Has the patient been seen at the hospital within the last three years: Yes Total Score: 65 Level Of Care: New/Established - Level 2 Electronic Signature(s) Signed: 11/11/2020 7:54:12 AM By: Carlene Coria RN Entered By: Carlene Coria on 11/06/2020 09:27:38 Zettler, [name] ([id number]) -------------------------------------------------------------------------------- Encounter Discharge Information Details Patient Name: [name] Date of Service: 11/06/2020 8:45 AM Medical Record Number: [id number] Patient Account Number: [id number] Date of Birth/Sex: 12-15-1965 (55  y.o. F) Treating RN: Carlene Coria Primary Care Safir Michalec: Iona Beard Other Clinician: Referring Jaiel Saraceno: Iona Beard Treating Uchechi Denison/Extender: Skipper Cliche in Treatment: 2 Encounter Discharge Information Items Discharge Condition: Stable Ambulatory Status: Ambulatory Discharge Destination: Home Transportation: Private Auto Accompanied By: self Schedule Follow-up Appointment: Yes Clinical Summary of Care: Patient Declined Electronic Signature(s) Signed: 11/11/2020 7:54:12 AM By: Carlene Coria RN Entered By: Carlene Coria on 11/06/2020 09:28:32 Bunyan, [name] ([id number]) -------------------------------------------------------------------------------- Lower Extremity Assessment Details Patient Name: [name] Date of Service: 11/06/2020 8:45 AM Medical Record Number: [id number] Patient Account Number: [id number] Date of Birth/Sex: 10-27-1965 (55 y.o. F) Treating RN: Carlene Coria Primary Care Jermar Colter: Iona Beard Other Clinician: Referring Claudie Brickhouse: Iona Beard Treating Netty Sullivant/Extender: Skipper Cliche in Treatment: 2 Edema Assessment Assessed: [Left: No] [Right: No] Edema: [Left: Ye] [Right: s] Calf Left: Right: Point of Measurement: 33 cm From Medial Instep 46 cm Ankle Left: Right: Point of Measurement: 10 cm From Medial Instep 28 cm Knee To Floor Left: Right: From Medial Instep 43 cm Vascular Assessment Pulses: Dorsalis Pedis Palpable: [Left:Yes] Electronic Signature(s) Signed: 11/11/2020 7:54:12 AM By: Carlene Coria RN Entered By: Carlene Coria on 11/06/2020 09:06:03 Barhorst, [name] ([id number]) -------------------------------------------------------------------------------- Multi Wound Chart Details Patient Name: [name] Date of Service: 11/06/2020 8:45 AM Medical Record Number: [id number] Patient Account Number: [id number] Date of Birth/Sex: 1965-05-31 (55 y.o. F) Treating RN: Carlene Coria Primary Care Braxxton Stoudt:  Iona Beard Other Clinician: Referring Cyndie Woodbeck: Iona Beard Treating Drevion Offord/Extender: Skipper Cliche in Treatment: 2 Vital Signs Height(in): 60 Pulse(bpm): 47 Weight(lbs): 264 Blood Pressure(mmHg): 147/83 Body Mass Index(BMI): 52 Temperature(F): 98.3 Respiratory Rate(breaths/min): 18 Photos: [N/A:N/A] Wound Location: Left, Medial Lower Leg N/A N/A Wounding Event: Gradually Appeared N/A N/A Primary Etiology: Diabetic Wound/Ulcer of the Lower N/A N/A Extremity Comorbid History: Congestive Heart Failure, N/A N/A Hypertension, Type II Diabetes Date Acquired: 09/28/2020  N/A N/A Weeks of Treatment: 2 N/A N/A Wound Status: Open N/A N/A Measurements L x W x D (cm) 0x0x0 N/A N/A Area (cm) : 0 N/A N/A Volume (cm) : 0 N/A N/A % Reduction in Area: 100.00% N/A N/A % Reduction in Volume: 100.00% N/A N/A Classification: Grade 2 N/A N/A Exudate Amount: None Present N/A N/A Granulation Amount: None Present (0%) N/A N/A Necrotic Amount: None Present (0%) N/A N/A Exposed Structures: Fascia: No N/A N/A Fat Layer (Subcutaneous Tissue): No Tendon: No Muscle: No Joint: No Bone: No Epithelialization: None N/A N/A Treatment Notes Electronic Signature(s) Signed: 11/11/2020 7:54:12 AM By: Carlene Coria RN Entered By: Carlene Coria on 11/06/2020 09:26:41 Faulstich, [name] ([id number]) -------------------------------------------------------------------------------- [place] Details Patient Name: [name] Date of Service: 11/06/2020 8:45 AM Medical Record Number: [id number] Patient Account Number: [id number] Date of Birth/Sex: 09/06/1965 (55 y.o. F) Treating RN: Carlene Coria Primary Care Taiz Bickle: Iona Beard Other Clinician: Referring Carlo Guevarra: Iona Beard Treating Riley Papin/Extender: Skipper Cliche in Treatment: 2 Active Inactive Electronic Signature(s) Signed: 11/11/2020 7:54:12 AM By: Carlene Coria RN Entered By: Carlene Coria on 11/06/2020  09:26:28 Lyster, [name] ([id number]) -------------------------------------------------------------------------------- Pain Assessment Details Patient Name: [name] Date of Service: 11/06/2020 8:45 AM Medical Record Number: [id number] Patient Account Number: [id number] Date of Birth/Sex: 11/02/1965 (55 y.o. F) Treating RN: Carlene Coria Primary Care Kayleanna Lorman: Iona Beard Other Clinician: Referring Amila Callies: Iona Beard Treating Cuma Polyakov/Extender: Skipper Cliche in Treatment: 2 Active Problems Location of Pain Severity and Description of Pain Patient Has Paino No Site Locations Pain Management and Medication Current Pain Management: Electronic Signature(s) Signed: 11/11/2020 7:54:12 AM By: Carlene Coria RN Entered By: Carlene Coria on 11/06/2020 09:02:05 Aggarwal, [name] ([id number]) -------------------------------------------------------------------------------- Patient/Caregiver Education Details Patient Name: [name] Date of Service: 11/06/2020 8:45 AM Medical Record Number: [id number] Patient Account Number: [id number] Date of Birth/Gender: 1965/06/22 (55 y.o. F) Treating RN: Carlene Coria Primary Care Physician: Iona Beard Other Clinician: Referring Physician: Iona Beard Treating Physician/Extender: Skipper Cliche in Treatment: 2 Education Assessment Education Provided To: Patient Education Topics Provided Wound/Skin Impairment: Methods: Explain/Verbal Responses: State content correctly Electronic Signature(s) Signed: 11/11/2020 7:54:12 AM By: Carlene Coria RN Entered By: Carlene Coria on 11/06/2020 09:27:56 Derks, [name] ([id number]) -------------------------------------------------------------------------------- Wound Assessment Details Patient Name: [name] Date of Service: 11/06/2020 8:45 AM Medical Record Number: [id number] Patient Account Number: [id number] Date of Birth/Sex: 1965-08-14 (55 y.o.  F) Treating RN: Carlene Coria Primary Care Lennell Shanks: Iona Beard Other Clinician: Referring Malka Bocek: Iona Beard Treating Dru Laurel/Extender: Skipper Cliche in Treatment: 2 Wound Status Wound Number: 1 Primary Etiology: Diabetic Wound/Ulcer of the Lower Extremity Wound Location: Left, Medial Lower Leg Wound Status: Open Wounding Event: Gradually Appeared Comorbid Congestive Heart Failure, Hypertension, Type II History: Diabetes Date Acquired: 09/28/2020 Weeks Of Treatment: 2 Clustered Wound: No Photos Wound Measurements Length: (cm) 0 Width: (cm) 0 Depth: (cm) 0 Area: (cm) Volume: (cm) % Reduction in Area: 100% % Reduction in Volume: 100% Epithelialization: None 0 Tunneling: No 0 Undermining: No Wound Description Classification: Grade 2 Exudate Amount: None Present Foul Odor After Cleansing: No Slough/Fibrino No Wound Bed Granulation Amount: None Present (0%) Exposed Structure Necrotic Amount: None Present (0%) Fascia Exposed: No Fat Layer (Subcutaneous Tissue) Exposed: No Tendon Exposed: No Muscle Exposed: No Joint Exposed: No Bone Exposed: No Electronic Signature(s) Signed: 11/11/2020 7:54:12 AM By: Carlene Coria RN Entered By: Carlene Coria on 11/06/2020 09:26:03 Bathe, [name] ([id number]) -------------------------------------------------------------------------------- Vitals Details Patient Name: [name]. Date  of Service: 11/06/2020 8:45 AM Medical Record Number: [id number] Patient Account Number: [id number] Date of Birth/Sex: [birth year] (55 y.o. F) Treating RN: Carlene Coria Primary Care Eleny Cortez: Iona Beard Other Clinician: Referring Allena Pietila: Iona Beard Treating Lakendria Nicastro/Extender: Skipper Cliche in Treatment: 2 Vital Signs Time Taken: 09:01 Temperature (F): 98.3 Height (in): 60 Pulse (bpm): 51 Weight (lbs): 264 Respiratory Rate (breaths/min): 18 Body Mass Index (BMI): 51.6 Blood Pressure (mmHg): 147/83 Reference  Range: 80 - 120 mg / dl Electronic Signature(s) Signed: 11/11/2020 7:54:12 AM By: Carlene Coria RN Entered By: Carlene Coria on 11/06/2020 09:01:58

## 2020-11-12 ENCOUNTER — Encounter: Payer: Medicare Other | Admitting: Physician Assistant

## 2020-11-12 DIAGNOSIS — E108 Type 1 diabetes mellitus with unspecified complications: Secondary | ICD-10-CM | POA: Diagnosis not present

## 2020-11-12 DIAGNOSIS — Z794 Long term (current) use of insulin: Secondary | ICD-10-CM | POA: Diagnosis not present

## 2020-11-13 ENCOUNTER — Ambulatory Visit: Payer: Medicare Other | Admitting: Dietician

## 2020-11-17 DIAGNOSIS — J45998 Other asthma: Secondary | ICD-10-CM | POA: Diagnosis not present

## 2020-11-17 DIAGNOSIS — E1122 Type 2 diabetes mellitus with diabetic chronic kidney disease: Secondary | ICD-10-CM | POA: Diagnosis not present

## 2020-11-17 DIAGNOSIS — S81802D Unspecified open wound, left lower leg, subsequent encounter: Secondary | ICD-10-CM | POA: Diagnosis not present

## 2020-11-17 DIAGNOSIS — I10 Essential (primary) hypertension: Secondary | ICD-10-CM | POA: Diagnosis not present

## 2020-11-17 DIAGNOSIS — R001 Bradycardia, unspecified: Secondary | ICD-10-CM | POA: Diagnosis not present

## 2020-11-17 DIAGNOSIS — H1830 Unspecified corneal membrane change: Secondary | ICD-10-CM | POA: Diagnosis not present

## 2020-11-17 DIAGNOSIS — N1832 Chronic kidney disease, stage 3b: Secondary | ICD-10-CM | POA: Diagnosis not present

## 2020-11-17 DIAGNOSIS — Z0001 Encounter for general adult medical examination with abnormal findings: Secondary | ICD-10-CM | POA: Diagnosis not present

## 2020-11-19 ENCOUNTER — Encounter: Payer: Medicare Other | Admitting: Physician Assistant

## 2020-11-26 ENCOUNTER — Encounter: Payer: Medicare Other | Admitting: Physician Assistant

## 2020-11-27 DIAGNOSIS — E039 Hypothyroidism, unspecified: Secondary | ICD-10-CM | POA: Diagnosis not present

## 2020-11-27 DIAGNOSIS — I129 Hypertensive chronic kidney disease with stage 1 through stage 4 chronic kidney disease, or unspecified chronic kidney disease: Secondary | ICD-10-CM | POA: Diagnosis not present

## 2020-11-27 DIAGNOSIS — E1122 Type 2 diabetes mellitus with diabetic chronic kidney disease: Secondary | ICD-10-CM | POA: Diagnosis not present

## 2020-11-27 DIAGNOSIS — N1832 Chronic kidney disease, stage 3b: Secondary | ICD-10-CM | POA: Diagnosis not present

## 2020-12-01 DIAGNOSIS — E108 Type 1 diabetes mellitus with unspecified complications: Secondary | ICD-10-CM | POA: Diagnosis not present

## 2020-12-01 DIAGNOSIS — Z794 Long term (current) use of insulin: Secondary | ICD-10-CM | POA: Diagnosis not present

## 2020-12-03 DIAGNOSIS — L609 Nail disorder, unspecified: Secondary | ICD-10-CM | POA: Diagnosis not present

## 2020-12-03 DIAGNOSIS — L110 Acquired keratosis follicularis: Secondary | ICD-10-CM | POA: Diagnosis not present

## 2020-12-03 DIAGNOSIS — E1140 Type 2 diabetes mellitus with diabetic neuropathy, unspecified: Secondary | ICD-10-CM | POA: Diagnosis not present

## 2020-12-11 ENCOUNTER — Telehealth: Payer: Self-pay | Admitting: Cardiology

## 2020-12-11 NOTE — Telephone Encounter (Signed)
Please advise 

## 2020-12-11 NOTE — Telephone Encounter (Signed)
Patient has concerns with heart rate, says it's low (in the 40s at times).

## 2020-12-12 NOTE — Telephone Encounter (Signed)
Sent my Chart message, asymptomatic bradycardia. Reassure

## 2020-12-14 DIAGNOSIS — Z794 Long term (current) use of insulin: Secondary | ICD-10-CM | POA: Diagnosis not present

## 2020-12-14 DIAGNOSIS — E108 Type 1 diabetes mellitus with unspecified complications: Secondary | ICD-10-CM | POA: Diagnosis not present

## 2020-12-17 ENCOUNTER — Other Ambulatory Visit (HOSPITAL_COMMUNITY): Payer: Self-pay | Admitting: Family Medicine

## 2020-12-17 DIAGNOSIS — Z1231 Encounter for screening mammogram for malignant neoplasm of breast: Secondary | ICD-10-CM

## 2020-12-17 DIAGNOSIS — J45998 Other asthma: Secondary | ICD-10-CM | POA: Diagnosis not present

## 2020-12-22 DIAGNOSIS — H04123 Dry eye syndrome of bilateral lacrimal glands: Secondary | ICD-10-CM | POA: Diagnosis not present

## 2020-12-28 ENCOUNTER — Ambulatory Visit (HOSPITAL_COMMUNITY)
Admission: RE | Admit: 2020-12-28 | Discharge: 2020-12-28 | Disposition: A | Discharge status: Home / Self Care | Payer: Medicare Other | Source: Ambulatory Visit | Attending: Family Medicine | Admitting: Family Medicine

## 2020-12-28 ENCOUNTER — Other Ambulatory Visit: Payer: Self-pay

## 2020-12-28 ENCOUNTER — Encounter (HOSPITAL_COMMUNITY): Payer: Self-pay

## 2020-12-28 DIAGNOSIS — I129 Hypertensive chronic kidney disease with stage 1 through stage 4 chronic kidney disease, or unspecified chronic kidney disease: Secondary | ICD-10-CM | POA: Diagnosis not present

## 2020-12-28 DIAGNOSIS — Z1231 Encounter for screening mammogram for malignant neoplasm of breast: Secondary | ICD-10-CM | POA: Insufficient documentation

## 2020-12-28 DIAGNOSIS — N1832 Chronic kidney disease, stage 3b: Secondary | ICD-10-CM | POA: Diagnosis not present

## 2020-12-28 DIAGNOSIS — E039 Hypothyroidism, unspecified: Secondary | ICD-10-CM | POA: Diagnosis not present

## 2020-12-28 DIAGNOSIS — E1122 Type 2 diabetes mellitus with diabetic chronic kidney disease: Secondary | ICD-10-CM | POA: Diagnosis not present

## 2020-12-29 DIAGNOSIS — E870 Hyperosmolality and hypernatremia: Secondary | ICD-10-CM | POA: Diagnosis not present

## 2020-12-29 DIAGNOSIS — N189 Chronic kidney disease, unspecified: Secondary | ICD-10-CM | POA: Diagnosis not present

## 2020-12-29 DIAGNOSIS — D631 Anemia in chronic kidney disease: Secondary | ICD-10-CM | POA: Diagnosis not present

## 2020-12-29 DIAGNOSIS — E1122 Type 2 diabetes mellitus with diabetic chronic kidney disease: Secondary | ICD-10-CM | POA: Diagnosis not present

## 2020-12-29 DIAGNOSIS — E211 Secondary hyperparathyroidism, not elsewhere classified: Secondary | ICD-10-CM | POA: Diagnosis not present

## 2020-12-31 DIAGNOSIS — Z794 Long term (current) use of insulin: Secondary | ICD-10-CM | POA: Diagnosis not present

## 2020-12-31 DIAGNOSIS — E211 Secondary hyperparathyroidism, not elsewhere classified: Secondary | ICD-10-CM | POA: Diagnosis not present

## 2020-12-31 DIAGNOSIS — E1129 Type 2 diabetes mellitus with other diabetic kidney complication: Secondary | ICD-10-CM | POA: Diagnosis not present

## 2020-12-31 DIAGNOSIS — R809 Proteinuria, unspecified: Secondary | ICD-10-CM | POA: Diagnosis not present

## 2020-12-31 DIAGNOSIS — N189 Chronic kidney disease, unspecified: Secondary | ICD-10-CM | POA: Diagnosis not present

## 2020-12-31 DIAGNOSIS — E108 Type 1 diabetes mellitus with unspecified complications: Secondary | ICD-10-CM | POA: Diagnosis not present

## 2020-12-31 DIAGNOSIS — D631 Anemia in chronic kidney disease: Secondary | ICD-10-CM | POA: Diagnosis not present

## 2020-12-31 DIAGNOSIS — E1122 Type 2 diabetes mellitus with diabetic chronic kidney disease: Secondary | ICD-10-CM | POA: Diagnosis not present

## 2021-01-04 ENCOUNTER — Other Ambulatory Visit: Payer: Self-pay

## 2021-01-04 ENCOUNTER — Encounter: Payer: Self-pay | Admitting: Cardiology

## 2021-01-04 ENCOUNTER — Ambulatory Visit: Payer: Medicare Other | Admitting: Cardiology

## 2021-01-04 ENCOUNTER — Ambulatory Visit (HOSPITAL_COMMUNITY)
Admission: RE | Admit: 2021-01-04 | Discharge: 2021-01-04 | Disposition: A | Discharge status: Home / Self Care | Payer: Medicare Other | Source: Ambulatory Visit | Attending: Family Medicine | Admitting: Family Medicine

## 2021-01-04 VITALS — BP 146/65 | HR 57 | Temp 98.0°F | Resp 12 | Ht 60.0 in | Wt 269.8 lb

## 2021-01-04 DIAGNOSIS — I5032 Chronic diastolic (congestive) heart failure: Secondary | ICD-10-CM

## 2021-01-04 DIAGNOSIS — Z1231 Encounter for screening mammogram for malignant neoplasm of breast: Secondary | ICD-10-CM | POA: Insufficient documentation

## 2021-01-04 DIAGNOSIS — I358 Other nonrheumatic aortic valve disorders: Secondary | ICD-10-CM | POA: Diagnosis not present

## 2021-01-04 DIAGNOSIS — R001 Bradycardia, unspecified: Secondary | ICD-10-CM | POA: Diagnosis not present

## 2021-01-04 DIAGNOSIS — I10 Essential (primary) hypertension: Secondary | ICD-10-CM | POA: Diagnosis not present

## 2021-01-04 DIAGNOSIS — Z7689 Persons encountering health services in other specified circumstances: Secondary | ICD-10-CM | POA: Diagnosis not present

## 2021-01-04 MED ORDER — VALSARTAN 40 MG PO TABS
40.0000 mg | ORAL_TABLET | Freq: Every day | ORAL | 3 refills | Status: DC
Start: 2021-01-04 — End: 2021-01-18

## 2021-01-04 NOTE — Progress Notes (Signed)
Primary Physician/Referring:  Iona Beard, MD  Patient ID: [name], female    DOB: 1965/04/28, 55 y.o.   MRN: [id number]  Chief Complaint  Patient presents with   Chronic diastolic CHF    Follow-up    1 year    [name]  is a 55 y.o. 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  Patient was last seen in our office by Dr. Einar Gip 05/13/2019 at which time she was stable from a cardiovascular standpoint and advised for as needed follow-up.  Patient now presents for reevaluation and management of hypertension and bradycardia.  Patient is being followed closely by her PCP with remote blood pressure monitoring.  Will blood pressures have been uncontrolled and heart rate noted to be as low as 45 bpm occasionally.  Patient is asymptomatic from a cardiovascular standpoint.  Denies chest pain, dizziness, dyspnea, palpitations, syncope, near syncope.  Denies orthopnea, PND.  Patient does have chronic bilateral leg edema which is stable.  Patient has component of nonpitting lymphedema due to chronic venous stasis.  She wears support stockings on a daily basis.  Notably patient was seen by nephrology yesterday who recommended initiation of valsartan 40 mg daily.  Past Medical History:  Diagnosis Date   Acute asthma exacerbation 12/21/2014   Acute renal failure ([place])    ARF (acute renal failure) ([place]) 05/03/2014   Asthma    Asthma exacerbation 05/03/2014   Asthma, severe persistent 05/03/2014   Chronic diastolic CHF (congestive heart failure) ([place]) 06/21/2018   Diabetes mellitus    Diabetes mellitus type 2 in obese ([place]) 05/03/2014   History of echocardiogram 08/5641   LVH, diastolic dysfunction   Hyperlipidemia    Hypertension    Hyponatremia  12/02/2014   Obesity    Preop examination 05/18/2017   SOB (shortness of breath) 12/22/2014   Past Surgical History:  Procedure Laterality Date   CARDIAC CATHETERIZATION  03/2013   normal coronary arteries, EF 55%   CARDIAC CATHETERIZATION  03/2013   normal coronary arteries   CATARACT EXTRACTION W/PHACO Right 12/10/2012   Procedure: CATARACT EXTRACTION PHACO AND INTRAOCULAR LENS PLACEMENT (IOC);  Surgeon: Tonny Branch, MD;  Location: [facility];  Service: Ophthalmology;  Laterality: Right;  CDE:22..42   CATARACT EXTRACTION W/PHACO Left 05/11/2015   Procedure: CATARACT EXTRACTION PHACO AND INTRAOCULAR LENS PLACEMENT LEFT EYE CDE=6.54;  Surgeon: Tonny Branch, MD;  Location: [facility];  Service: Ophthalmology;  Laterality: Left;   COLONOSCOPY N/A 09/22/2017   Procedure: COLONOSCOPY;  Surgeon: Danie Binder, MD;  Location: [facility];  Service: Endoscopy;  Laterality: N/A;  12:15   EYE SURGERY Left    GASTRIC BYPASS  05/01/2018   GASTRIC ROUX-EN-Y N/A 05/01/2018   Procedure: LAPAROSCOPIC ROUX-EN-Y GASTRIC BYPASS WITH UPPER ENDOSCOPY WITH ERAS PATHWAY;  Surgeon: Kinsinger, Arta Bruce, MD;  Location: [facility];  Service: General;  Laterality: N/A;   LEFT HEART CATHETERIZATION WITH CORONARY ANGIOGRAM N/A 04/09/2013   Procedure: LEFT HEART CATHETERIZATION WITH CORONARY ANGIOGRAM;  Surgeon: Laverda Page, MD;  Location: [facility] CATH LAB;  Service: Cardiovascular;  Laterality: N/A;   TRACHEOSTOMY     at age 57 from asthma attack   Family History  Problem Relation Age of Onset   Diabetes Mother    Asthma Other    Hypertension Other  Hypertension Sister    Social History   Tobacco Use   Smoking status: Never   Smokeless tobacco: Never  Substance Use Topics   Alcohol use: Not Currently    Allergies  No Known Allergies   Medications Prior to Visit:   Outpatient Medications Prior to Visit  Medication Sig Dispense Refill   acetaminophen (TYLENOL) 500 MG tablet Take 500 mg by mouth every 6 (six) hours  as needed for moderate pain.      albuterol (PROVENTIL HFA;VENTOLIN HFA) 108 (90 BASE) MCG/ACT inhaler Inhale 2 puffs into the lungs every 6 (six) hours as needed for wheezing.     albuterol (PROVENTIL) (2.5 MG/3ML) 0.083% nebulizer solution Take 3 mLs (2.5 mg total) by nebulization every 6 (six) hours as needed for wheezing or shortness of breath. 75 mL 12   amLODipine (NORVASC) 5 MG tablet TAKE ONE TABLET BY MOUTH ONCE DAILY. 90 tablet 0   atorvastatin (LIPITOR) 20 MG tablet Take 20 mg by mouth daily.      calcitRIOL (ROCALTROL) 0.25 MCG capsule Take 0.25 mcg by mouth as directed.     Calcium Carbonate-Vitamin D (CALCIUM 600+D PO) Take 1 tablet by mouth daily.     carboxymethylcellulose (REFRESH PLUS) 0.5 % SOLN Place 1 drop into both eyes 3 (three) times daily as needed (dry eyes).     carvedilol (COREG) 6.25 MG tablet Take 6.25 mg by mouth 2 (two) times daily.      Cholecalciferol (VITAMIN D) 2000 units tablet Take 2,000 Units by mouth daily.      Fluticasone-Salmeterol (ADVAIR) 100-50 MCG/DOSE AEPB Inhale 1 puff into the lungs 2 (two) times daily.     gabapentin (NEURONTIN) 300 MG capsule Take 1 capsule (300 mg total) by mouth 4 (four) times daily. 120 capsule 2   Insulin Human (INSULIN PUMP) SOLN Inject into the skin.     levothyroxine (SYNTHROID, LEVOTHROID) 75 MCG tablet Take 75 mcg by mouth daily before breakfast.     montelukast (SINGULAIR) 10 MG tablet Take 10 mg by mouth daily.      Multiple Vitamins-Minerals (MULTIVITAMIN WITH MINERALS) tablet Take 1 tablet by mouth daily.     insulin regular human CONCENTRATED (HUMULIN R) 500 UNIT/ML injection Inject into the skin.     torsemide (DEMADEX) 20 MG tablet Take 20 mg by mouth 2 (two) times a day. Two tablets in morning and one in the evening     pantoprazole (PROTONIX) 40 MG tablet Take 1 tablet (40 mg total) by mouth daily. 90 tablet 0   No facility-administered medications prior to visit.   Final Medications at End of Visit     Current Meds  Medication Sig   acetaminophen (TYLENOL) 500 MG tablet Take 500 mg by mouth every 6 (six) hours as needed for moderate pain.    albuterol (PROVENTIL HFA;VENTOLIN HFA) 108 (90 BASE) MCG/ACT inhaler Inhale 2 puffs into the lungs every 6 (six) hours as needed for wheezing.   albuterol (PROVENTIL) (2.5 MG/3ML) 0.083% nebulizer solution Take 3 mLs (2.5 mg total) by nebulization every 6 (six) hours as needed for wheezing or shortness of breath.   amLODipine (NORVASC) 5 MG tablet TAKE ONE TABLET BY MOUTH ONCE DAILY.   atorvastatin (LIPITOR) 20 MG tablet Take 20 mg by mouth daily.    calcitRIOL (ROCALTROL) 0.25 MCG capsule Take 0.25 mcg by mouth as directed.   Calcium Carbonate-Vitamin D (CALCIUM 600+D PO) Take 1 tablet by mouth daily.   carboxymethylcellulose (REFRESH PLUS) 0.5 % SOLN Place  1 drop into both eyes 3 (three) times daily as needed (dry eyes).   carvedilol (COREG) 6.25 MG tablet Take 6.25 mg by mouth 2 (two) times daily.    Cholecalciferol (VITAMIN D) 2000 units tablet Take 2,000 Units by mouth daily.    Fluticasone-Salmeterol (ADVAIR) 100-50 MCG/DOSE AEPB Inhale 1 puff into the lungs 2 (two) times daily.   gabapentin (NEURONTIN) 300 MG capsule Take 1 capsule (300 mg total) by mouth 4 (four) times daily.   Insulin Human (INSULIN PUMP) SOLN Inject into the skin.   levothyroxine (SYNTHROID, LEVOTHROID) 75 MCG tablet Take 75 mcg by mouth daily before breakfast.   montelukast (SINGULAIR) 10 MG tablet Take 10 mg by mouth daily.    Multiple Vitamins-Minerals (MULTIVITAMIN WITH MINERALS) tablet Take 1 tablet by mouth daily.   valsartan (DIOVAN) 40 MG tablet Take 1 tablet (40 mg total) by mouth daily.   ROS   Review of Systems  Constitutional: Positive for weight loss (apporximately 100 lbs since gastric bypass surgery). Negative for malaise/fatigue and weight gain.  Cardiovascular:  Positive for dyspnea on exertion (stable) and leg swelling (chronic, stable). Negative for  chest pain, claudication, near-syncope, orthopnea, palpitations, paroxysmal nocturnal dyspnea and syncope.  Respiratory:  Negative for shortness of breath.   Neurological:  Positive for dizziness. Negative for loss of balance.  All other systems reviewed and are negative.  Objective:  Blood pressure (!) 146/65, pulse (!) 57, temperature 98 F (36.7 C), temperature source Temporal, resp. rate 12, height 5' (1.524 m), weight 269 lb 12.8 oz (122.4 kg), last menstrual period 09/11/2013, SpO2 99 %. Body mass index is 52.69 kg/m.  Vitals with BMI 01/04/2021 10/30/2020 10/30/2020  Height 5\' 0"  - -  Weight 269 lbs 13 oz - -  BMI 19.41 - -  Systolic 740 814 481  Diastolic 65 80 50  Pulse 57 62 54    Physical Exam Vitals reviewed.  Constitutional:      General: She is not in acute distress.    Appearance: She is well-developed.     Comments: Morbidly obese  Neck:     Comments: Short neck and difficult to evaluate JVP Cardiovascular:     Rate and Rhythm: Normal rate and regular rhythm.     Pulses:          Carotid pulses are 2+ on the right side and 2+ on the left side.      Dorsalis pedis pulses are 2+ on the right side and 2+ on the left side.       Posterior tibial pulses are 2+ on the right side and 2+ on the left side.     Heart sounds: Murmur heard.  Midsystolic murmur is present with a grade of 2/6 at the upper right sternal border.    No gallop.     Comments: Femoral and popliteal pulse difficult to feel due to patient's body habitus.  Chronic venostasis changes noted in the lower extremity along with 1+ pitting edema.  Skin has the appearance of a Peu-de-orange. Significant non-pitting edema bilaterally. JVD difficult to make out due to short neck. Pulmonary:     Effort: Pulmonary effort is normal.     Breath sounds: Normal breath sounds.  Abdominal:     Comments: Obese. Pannus present    CMP Latest Ref Rng & Units 10/30/2020 05/02/2018 04/25/2018  Glucose 70 - 99 mg/dL 74 203(H)  141(H)  BUN 6 - 20 mg/dL 46(H) 63(H) 71(H)  Creatinine 0.44 - 1.00 mg/dL 1.81(H)  1.58(H) 1.79(H)  Sodium 135 - 145 mmol/L 138 137 140  Potassium 3.5 - 5.1 mmol/L 3.8 4.0 4.0  Chloride 98 - 111 mmol/L 105 106 103  CO2 22 - 32 mmol/L 26 24 26   Calcium 8.9 - 10.3 mg/dL 8.6(L) 8.6(L) 9.4  Total Protein 6.5 - 8.1 g/dL 7.7 6.5 7.7  Total Bilirubin 0.3 - 1.2 mg/dL 0.3 0.4 0.3  Alkaline Phos 38 - 126 U/L 113 76 83  AST 15 - 41 U/L 20 22 15   ALT 0 - 44 U/L 20 19 14    CBC Latest Ref Rng & Units 10/30/2020 05/02/2018 05/01/2018  WBC 4.0 - 10.5 K/uL 7.7 15.5(H) -  Hemoglobin 12.0 - 15.0 g/dL 11.0(L) 10.4(L) 10.7(L)  Hematocrit 36.0 - 46.0 % 36.0 35.3(L) 35.6(L)  Platelets 150 - 400 K/uL 173 216 -   Lipid Panel  No results found for: CHOL, TRIG, HDL, CHOLHDL, VLDL, LDLCALC, LDLDIRECT HEMOGLOBIN A1C Lab Results  Component Value Date   HGBA1C 6.7 (H) 04/25/2018   MPG 145.59 04/25/2018   External labs: 12/29/2020: Sodium 144, potassium 4.7, BUN 49, creatinine 1.91, GFR 31 Hgb 10.9, HCT 34.3, MCV 84, platelet 179  Cholesterol, total 169.000 M 10/31/2017 HDL 44.000 M 10/31/2017 LDL 134 mg% Triglycerides 44.000 M 10/31/2017  A1C 6.600 11/07/2018; TSH 1.650 07/31/2018  Hemoglobin 10.100 G/ 01/03/2019  Creatinine, Serum 1.720 MG/ 01/03/2019 Potassium 4.500 mm 07/31/2018 ALT (SGPT) 12.000 U/L 05/15/2018  Radiology :  No results found.  Cardiac studies:   US Venous Img Lower Unilateral Left 03/13/2015: No evidence of left lower extremity DVT.  55 Sleep Study 2016: Uses nocturnal O2. Negative sleep study for apnea. On Home O2. Did not tolerate CPAP   Sleep study 2011: Dx'd with sleep apnea-does not use a CPAP.  On Nocturnal O2 therapy.   Coronary angiogram 04/09/2013: Normal coronary arteries, normal left ventricle systolic function. Markedly elevated LVEDP of 31 mmHg.   Echo 02/17/11: No apical views. Grossly preserved LVEF. Moderate LVH, moderate left atrial enlargement.   Lexiscan stress 02/25/11:  Mild gut uptake artifact. Normal perfusion. Normal LVEF. Low risk stress    EKG:   01/04/2021: Sinus rhythm at a rate of 56 bpm.  Normal axis.  No evidence of ischemia or underlying injury pattern.  Low voltage complexes, consider pulmonary disease pattern.  Compared to EKG 05/13/2019, no significant change.  05/13/2019: Normal sinus rhythm at rate of 65 bpm, normal axis.  No evidence of ischemia, normal EKG.  Borderline low voltage complexes.  No significant change from 11/12/2018.  Assessment:      ICD-10-CM   1. Chronic diastolic CHF (congestive heart failure) (HCC)  I50.32 EKG [id number]    Basic metabolic panel    Basic metabolic panel    2. Essential hypertension  W40 Basic metabolic panel    Basic metabolic panel    3. Aortic systolic murmur on examination  I35.8 PCV ECHOCARDIOGRAM COMPLETE    4. Bradycardia, sinus  R00.1        Recommendations:    [name]  is a 55 y.o. 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  Patient was last seen in our office by Dr. Einar Gip 05/13/2019 at which time she was stable from a cardiovascular standpoint and advised for as needed follow-up.  Patient now presents for reevaluation and management of hypertension  and bradycardia.  Patient reports episodes of asymptomatic bradycardia noted on home blood pressure monitoring.  EKG today shows normal sinus rhythm unchanged compared to previous in 2021.  As patient is asymptomatic reassured her regarding bradycardic episodes.  Counseled her regarding signs symptoms of warrant urgent or emergent evaluation, she verbalized understanding and agreement.  Right hypertension, blood pressure is uncontrolled.  We will continue carvedilol and amlodipine.  I personally reviewed  external labs from nephrology.  Agree with initiation of valsartan 40 mg p.o. once daily.  We will repeat BMP in 1 week.  There is no clinical evidence of heart failure at this time.  However on physical exam noted new systolic murmur in the aortic area, will therefore obtain echocardiogram. Advised patient to continue to follow with PCP and wound management for management of lymphedema.   Encourage patient to continue to work on weight loss, she is congratulated on lost 100 pounds over the last 2 years.  Follow-up in 6 weeks, sooner if needed, for hypertension and results of cardiac testing.   Alethia Berthold, PA-C 01/04/2021, 11:41 AM Office: [phone]

## 2021-01-14 DIAGNOSIS — Z794 Long term (current) use of insulin: Secondary | ICD-10-CM | POA: Diagnosis not present

## 2021-01-14 DIAGNOSIS — E108 Type 1 diabetes mellitus with unspecified complications: Secondary | ICD-10-CM | POA: Diagnosis not present

## 2021-01-15 DIAGNOSIS — I10 Essential (primary) hypertension: Secondary | ICD-10-CM | POA: Diagnosis not present

## 2021-01-15 DIAGNOSIS — I5032 Chronic diastolic (congestive) heart failure: Secondary | ICD-10-CM | POA: Diagnosis not present

## 2021-01-16 LAB — BASIC METABOLIC PANEL
BUN/Creatinine Ratio: 23 (ref 9–23)
BUN: 46 mg/dL — ABNORMAL HIGH (ref 6–24)
CO2: 23 mmol/L (ref 20–29)
Calcium: 9 mg/dL (ref 8.7–10.2)
Chloride: 104 mmol/L (ref 96–106)
Creatinine, Ser: 2 mg/dL — ABNORMAL HIGH (ref 0.57–1.00)
Glucose: 67 mg/dL — ABNORMAL LOW (ref 70–99)
Potassium: 4.6 mmol/L (ref 3.5–5.2)
Sodium: 142 mmol/L (ref 134–144)
eGFR: 29 mL/min/{1.73_m2} — ABNORMAL LOW (ref >59)

## 2021-01-17 DIAGNOSIS — J45998 Other asthma: Secondary | ICD-10-CM | POA: Diagnosis not present

## 2021-01-18 ENCOUNTER — Other Ambulatory Visit: Payer: Self-pay

## 2021-01-18 MED ORDER — HYDRALAZINE HCL 25 MG PO TABS: 25.0000 mg | ORAL_TABLET | Freq: Three times a day (TID) | ORAL | 3 refills | Status: DC

## 2021-01-18 NOTE — Progress Notes (Signed)
Done and new med sent to pt pharmacy

## 2021-01-18 NOTE — Progress Notes (Signed)
Can you please make sure this gets done today?

## 2021-01-19 ENCOUNTER — Ambulatory Visit: Payer: Medicare Other

## 2021-01-19 ENCOUNTER — Other Ambulatory Visit: Payer: Self-pay

## 2021-01-19 DIAGNOSIS — I358 Other nonrheumatic aortic valve disorders: Secondary | ICD-10-CM

## 2021-01-27 DIAGNOSIS — E1122 Type 2 diabetes mellitus with diabetic chronic kidney disease: Secondary | ICD-10-CM | POA: Diagnosis not present

## 2021-01-27 DIAGNOSIS — I129 Hypertensive chronic kidney disease with stage 1 through stage 4 chronic kidney disease, or unspecified chronic kidney disease: Secondary | ICD-10-CM | POA: Diagnosis not present

## 2021-01-27 DIAGNOSIS — N1832 Chronic kidney disease, stage 3b: Secondary | ICD-10-CM | POA: Diagnosis not present

## 2021-01-29 DIAGNOSIS — Z794 Long term (current) use of insulin: Secondary | ICD-10-CM | POA: Diagnosis not present

## 2021-01-29 DIAGNOSIS — E108 Type 1 diabetes mellitus with unspecified complications: Secondary | ICD-10-CM | POA: Diagnosis not present

## 2021-02-09 DIAGNOSIS — R001 Bradycardia, unspecified: Secondary | ICD-10-CM | POA: Diagnosis not present

## 2021-02-09 DIAGNOSIS — R519 Headache, unspecified: Secondary | ICD-10-CM | POA: Diagnosis not present

## 2021-02-09 DIAGNOSIS — N1832 Chronic kidney disease, stage 3b: Secondary | ICD-10-CM | POA: Diagnosis not present

## 2021-02-09 DIAGNOSIS — E1122 Type 2 diabetes mellitus with diabetic chronic kidney disease: Secondary | ICD-10-CM | POA: Diagnosis not present

## 2021-02-09 DIAGNOSIS — I5032 Chronic diastolic (congestive) heart failure: Secondary | ICD-10-CM | POA: Diagnosis not present

## 2021-02-09 DIAGNOSIS — I10 Essential (primary) hypertension: Secondary | ICD-10-CM | POA: Diagnosis not present

## 2021-02-09 DIAGNOSIS — Z7689 Persons encountering health services in other specified circumstances: Secondary | ICD-10-CM | POA: Diagnosis not present

## 2021-02-09 DIAGNOSIS — E039 Hypothyroidism, unspecified: Secondary | ICD-10-CM | POA: Diagnosis not present

## 2021-02-15 ENCOUNTER — Ambulatory Visit: Payer: Medicare Other | Admitting: Student

## 2021-02-15 ENCOUNTER — Other Ambulatory Visit: Payer: Self-pay

## 2021-02-15 ENCOUNTER — Encounter: Payer: Self-pay | Admitting: Student

## 2021-02-15 VITALS — BP 139/43 | HR 54 | Temp 97.8°F | Ht 60.0 in | Wt 273.0 lb

## 2021-02-15 DIAGNOSIS — R600 Localized edema: Secondary | ICD-10-CM

## 2021-02-15 DIAGNOSIS — E108 Type 1 diabetes mellitus with unspecified complications: Secondary | ICD-10-CM | POA: Diagnosis not present

## 2021-02-15 DIAGNOSIS — I10 Essential (primary) hypertension: Secondary | ICD-10-CM

## 2021-02-15 DIAGNOSIS — Z794 Long term (current) use of insulin: Secondary | ICD-10-CM | POA: Diagnosis not present

## 2021-02-15 NOTE — Progress Notes (Signed)
Primary Physician/Referring:  Iona Beard, MD  Patient ID: [name], female    DOB: [birth year], 55 y.o.   MRN: [id number]  Chief Complaint  Patient presents with   Hypertension   Follow-up   Results    [name]  is a 55 y.o. 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  Patient was referred back to our office for management of hypertension and bradycardiaOn 01/04/2021.  Patient has been notified by home blood pressure monitor of bradycardic episodes, however she is asymptomatic therefore no changes were made at last office visit in regards to heart rate.  However given uncontrolled hypertension and recommendation by nephrology started valsartan 40 mg once daily, However patient developed worsening renal function therefore stopped valsartan and switch her to hydralazine 25 mg 3 times daily.  At last office visit noted new systolic murmur on exam, therefore ordered echocardiogram which revealed normal LVEF and no significant valvular disease. Patient now presents for 6-week follow up.  She is tolerating hydralazine without issue and blood pressure control has improved.  She continues to follow with PCP for remote patient monitoring of blood pressure.  Patient is without specific complaints today. Patient is asymptomatic from a cardiovascular standpoint.  Denies chest pain, dizziness, dyspnea, palpitations, syncope, near syncope.  Denies orthopnea, PND.  Past Medical History:  Diagnosis Date   Acute asthma exacerbation 12/21/2014   Acute renal failure ([place])    ARF (acute renal failure) ([place]) 05/03/2014   Asthma    Asthma exacerbation 05/03/2014   Asthma, severe persistent 05/03/2014   Chronic diastolic CHF (congestive heart failure) ([place])  06/21/2018   Diabetes mellitus    Diabetes mellitus type 2 in obese ([place]) 05/03/2014   History of echocardiogram 03/1192   LVH, diastolic dysfunction   Hyperlipidemia    Hypertension    Hyponatremia 12/02/2014   Obesity    Preop examination 05/18/2017   SOB (shortness of breath) 12/22/2014   Past Surgical History:  Procedure Laterality Date   CARDIAC CATHETERIZATION  03/2013   normal coronary arteries, EF 55%   CARDIAC CATHETERIZATION  03/2013   normal coronary arteries   CATARACT EXTRACTION W/PHACO Right 12/10/2012   Procedure: CATARACT EXTRACTION PHACO AND INTRAOCULAR LENS PLACEMENT (IOC);  Surgeon: Tonny Branch, MD;  Location: [facility];  Service: Ophthalmology;  Laterality: Right;  CDE:22..42   CATARACT EXTRACTION W/PHACO Left 05/11/2015   Procedure: CATARACT EXTRACTION PHACO AND INTRAOCULAR LENS PLACEMENT LEFT EYE CDE=6.54;  Surgeon: Tonny Branch, MD;  Location: [facility];  Service: Ophthalmology;  Laterality: Left;   COLONOSCOPY N/A 09/22/2017   Procedure: COLONOSCOPY;  Surgeon: Danie Binder, MD;  Location: [facility];  Service: Endoscopy;  Laterality: N/A;  12:15   EYE SURGERY Left    GASTRIC BYPASS  05/01/2018   GASTRIC ROUX-EN-Y N/A 05/01/2018   Procedure: LAPAROSCOPIC ROUX-EN-Y GASTRIC BYPASS WITH UPPER ENDOSCOPY WITH ERAS PATHWAY;  Surgeon: Kinsinger, Arta Bruce, MD;  Location: [facility];  Service: General;  Laterality: N/A;   LEFT HEART CATHETERIZATION WITH CORONARY ANGIOGRAM N/A 04/09/2013   Procedure: LEFT HEART CATHETERIZATION WITH CORONARY ANGIOGRAM;  Surgeon: Laverda Page, MD;  Location: [facility] CATH LAB;  Service: Cardiovascular;  Laterality: N/A;   TRACHEOSTOMY     at age 25 from asthma attack   Family History  Problem Relation Age of Onset  Diabetes Mother    Asthma Other    Hypertension Other    Hypertension Sister    Social History   Tobacco Use   Smoking status: Never   Smokeless tobacco: Never  Substance Use Topics   Alcohol use: Not Currently    Allergies  No  Known Allergies   Medications Prior to Visit:   Outpatient Medications Prior to Visit  Medication Sig Dispense Refill   acetaminophen (TYLENOL) 500 MG tablet Take 500 mg by mouth every 6 (six) hours as needed for moderate pain.      albuterol (PROVENTIL HFA;VENTOLIN HFA) 108 (90 BASE) MCG/ACT inhaler Inhale 2 puffs into the lungs every 6 (six) hours as needed for wheezing.     albuterol (PROVENTIL) (2.5 MG/3ML) 0.083% nebulizer solution Take 3 mLs (2.5 mg total) by nebulization every 6 (six) hours as needed for wheezing or shortness of breath. 75 mL 12   amLODipine (NORVASC) 5 MG tablet TAKE ONE TABLET BY MOUTH ONCE DAILY. 90 tablet 0   atorvastatin (LIPITOR) 20 MG tablet Take 20 mg by mouth daily.      calcitRIOL (ROCALTROL) 0.25 MCG capsule Take 0.25 mcg by mouth as directed.     Calcium Carbonate-Vitamin D (CALCIUM 600+D PO) Take 1 tablet by mouth daily.     carboxymethylcellulose (REFRESH PLUS) 0.5 % SOLN Place 1 drop into both eyes 3 (three) times daily as needed (dry eyes).     carvedilol (COREG) 6.25 MG tablet Take 6.25 mg by mouth 2 (two) times daily.      Cholecalciferol (VITAMIN D) 2000 units tablet Take 2,000 Units by mouth daily.      Fluticasone-Salmeterol (ADVAIR) 100-50 MCG/DOSE AEPB Inhale 1 puff into the lungs 2 (two) times daily.     gabapentin (NEURONTIN) 300 MG capsule Take 1 capsule (300 mg total) by mouth 4 (four) times daily. 120 capsule 2   hydrALAZINE (APRESOLINE) 25 MG tablet Take 1 tablet (25 mg total) by mouth 3 (three) times daily. 90 tablet 3   Insulin Human (INSULIN PUMP) SOLN Inject into the skin.     insulin regular human CONCENTRATED (HUMULIN R) 500 UNIT/ML injection Inject into the skin.     levothyroxine (SYNTHROID, LEVOTHROID) 75 MCG tablet Take 75 mcg by mouth daily before breakfast.     montelukast (SINGULAIR) 10 MG tablet Take 10 mg by mouth daily.      Multiple Vitamins-Minerals (MULTIVITAMIN WITH MINERALS) tablet Take 1 tablet by mouth daily.      torsemide (DEMADEX) 20 MG tablet Take 20 mg by mouth 2 (two) times a day. Two tablets in morning and one in the evening     No facility-administered medications prior to visit.   Final Medications at End of Visit    Current Meds  Medication Sig   acetaminophen (TYLENOL) 500 MG tablet Take 500 mg by mouth every 6 (six) hours as needed for moderate pain.    albuterol (PROVENTIL HFA;VENTOLIN HFA) 108 (90 BASE) MCG/ACT inhaler Inhale 2 puffs into the lungs every 6 (six) hours as needed for wheezing.   albuterol (PROVENTIL) (2.5 MG/3ML) 0.083% nebulizer solution Take 3 mLs (2.5 mg total) by nebulization every 6 (six) hours as needed for wheezing or shortness of breath.   amLODipine (NORVASC) 5 MG tablet TAKE ONE TABLET BY MOUTH ONCE DAILY.   atorvastatin (LIPITOR) 20 MG tablet Take 20 mg by mouth daily.    calcitRIOL (ROCALTROL) 0.25 MCG capsule Take 0.25 mcg by mouth as directed.   Calcium Carbonate-Vitamin D (  CALCIUM 600+D PO) Take 1 tablet by mouth daily.   carboxymethylcellulose (REFRESH PLUS) 0.5 % SOLN Place 1 drop into both eyes 3 (three) times daily as needed (dry eyes).   carvedilol (COREG) 6.25 MG tablet Take 6.25 mg by mouth 2 (two) times daily.    Cholecalciferol (VITAMIN D) 2000 units tablet Take 2,000 Units by mouth daily.    Fluticasone-Salmeterol (ADVAIR) 100-50 MCG/DOSE AEPB Inhale 1 puff into the lungs 2 (two) times daily.   gabapentin (NEURONTIN) 300 MG capsule Take 1 capsule (300 mg total) by mouth 4 (four) times daily.   hydrALAZINE (APRESOLINE) 25 MG tablet Take 1 tablet (25 mg total) by mouth 3 (three) times daily.   Insulin Human (INSULIN PUMP) SOLN Inject into the skin.   insulin regular human CONCENTRATED (HUMULIN R) 500 UNIT/ML injection Inject into the skin.   levothyroxine (SYNTHROID, LEVOTHROID) 75 MCG tablet Take 75 mcg by mouth daily before breakfast.   montelukast (SINGULAIR) 10 MG tablet Take 10 mg by mouth daily.    Multiple Vitamins-Minerals (MULTIVITAMIN WITH  MINERALS) tablet Take 1 tablet by mouth daily.   torsemide (DEMADEX) 20 MG tablet Take 20 mg by mouth 2 (two) times a day. Two tablets in morning and one in the evening   ROS   Review of Systems  Constitutional: Positive for weight loss (apporximately 100 lbs since gastric bypass surgery). Negative for malaise/fatigue and weight gain.  Cardiovascular:  Positive for dyspnea on exertion (stable) and leg swelling (chronic, stable). Negative for chest pain, claudication, near-syncope, orthopnea, palpitations, paroxysmal nocturnal dyspnea and syncope.  Respiratory:  Negative for shortness of breath.   Neurological:  Positive for dizziness. Negative for loss of balance.  All other systems reviewed and are negative.  Objective:  Blood pressure (!) 139/43, pulse (!) 54, temperature 97.8 F (36.6 C), height 5' (1.524 m), weight 273 lb (123.8 kg), last menstrual period 09/11/2013, SpO2 99 %. Body mass index is 53.32 kg/m.  Vitals with BMI 02/15/2021 01/04/2021 10/30/2020  Height 5\' 0"  5\' 0"  -  Weight 273 lbs 269 lbs 13 oz -  BMI 60.73 71.06 -  Systolic 269 485 462  Diastolic 43 65 80  Pulse 54 57 62    Physical Exam Vitals reviewed.  Constitutional:      General: She is not in acute distress.    Appearance: She is well-developed.     Comments: Morbidly obese  Neck:     Comments: Short neck and difficult to evaluate JVP Cardiovascular:     Rate and Rhythm: Normal rate and regular rhythm.     Pulses:          Carotid pulses are 2+ on the right side and 2+ on the left side.      Dorsalis pedis pulses are 2+ on the right side and 2+ on the left side.       Posterior tibial pulses are 2+ on the right side and 2+ on the left side.     Heart sounds: Murmur heard.  Midsystolic murmur is present with a grade of 2/6 at the upper right sternal border.    No gallop.     Comments: Femoral and popliteal pulse difficult to feel due to patient's body habitus.  Chronic venostasis changes noted in the  lower extremity along with 1+ pitting edema.  Skin has the appearance of a Peu-de-orange. Significant non-pitting edema bilaterally. JVD difficult to make out due to short neck. Pulmonary:     Effort: Pulmonary effort is normal.  Breath sounds: Normal breath sounds.  Abdominal:     Comments: Obese. Pannus present  Physical exam unchanged compared to previous office visit.  CMP Latest Ref Rng & Units 01/15/2021 10/30/2020 05/02/2018  Glucose 70 - 99 mg/dL 67(L) 74 203(H)  BUN 6 - 24 mg/dL 46(H) 46(H) 63(H)  Creatinine 0.57 - 1.00 mg/dL 2.00(H) 1.81(H) 1.58(H)  Sodium 134 - 144 mmol/L 142 138 137  Potassium 3.5 - 5.2 mmol/L 4.6 3.8 4.0  Chloride 96 - 106 mmol/L 104 105 106  CO2 20 - 29 mmol/L 23 26 24   Calcium 8.7 - 10.2 mg/dL 9.0 8.6(L) 8.6(L)  Total Protein 6.5 - 8.1 g/dL - 7.7 6.5  Total Bilirubin 0.3 - 1.2 mg/dL - 0.3 0.4  Alkaline Phos 38 - 126 U/L - 113 76  AST 15 - 41 U/L - 20 22  ALT 0 - 44 U/L - 20 19   CBC Latest Ref Rng & Units 10/30/2020 05/02/2018 05/01/2018  WBC 4.0 - 10.5 K/uL 7.7 15.5(H) -  Hemoglobin 12.0 - 15.0 g/dL 11.0(L) 10.4(L) 10.7(L)  Hematocrit 36.0 - 46.0 % 36.0 35.3(L) 35.6(L)  Platelets 150 - 400 K/uL 173 216 -   Lipid Panel  No results found for: CHOL, TRIG, HDL, CHOLHDL, VLDL, LDLCALC, LDLDIRECT HEMOGLOBIN A1C Lab Results  Component Value Date   HGBA1C 6.7 (H) 04/25/2018   MPG 145.59 04/25/2018   External labs: 12/29/2020: Sodium 144, potassium 4.7, BUN 49, creatinine 1.91, GFR 31 Hgb 10.9, HCT 34.3, MCV 84, platelet 179  Cholesterol, total 169.000 M 10/31/2017 HDL 44.000 M 10/31/2017 LDL 134 mg% Triglycerides 44.000 M 10/31/2017  A1C 6.600 11/07/2018; TSH 1.650 07/31/2018  Hemoglobin 10.100 G/ 01/03/2019  Creatinine, Serum 1.720 MG/ 01/03/2019 Potassium 4.500 mm 07/31/2018 ALT (SGPT) 12.000 U/L 05/15/2018  Radiology :  No results found.  Cardiac studies:  PCV ECHOCARDIOGRAM COMPLETE 64/68/0321 Normal LV systolic function with visual EF 55-60%. Left  ventricle cavity is normal in size. Normal left ventricular wall thickness. Normal global wall motion. Indeterminate diastolic filling pattern, elevated LAP. Mild tricuspid regurgitation. No evidence of pulmonary hypertension. Compared to study 02/17/2011 no significant change.   US Venous Img Lower Unilateral Left 03/13/2015: No evidence of left lower extremity DVT.  55 Sleep Study 2016: Uses nocturnal O2. Negative sleep study for apnea. On Home O2. Did not tolerate CPAP   Sleep study 2011: Dx'd with sleep apnea-does not use a CPAP.  On Nocturnal O2 therapy.   Coronary angiogram 04/09/2013: Normal coronary arteries, normal left ventricle systolic function. Markedly elevated LVEDP of 31 mmHg.   Echo 02/17/11: No apical views. Grossly preserved LVEF. Moderate LVH, moderate left atrial enlargement.   Lexiscan stress 02/25/11: Mild gut uptake artifact. Normal perfusion. Normal LVEF. Low risk stress    EKG:   01/04/2021: Sinus rhythm at a rate of 56 bpm.  Normal axis.  No evidence of ischemia or underlying injury pattern.  Low voltage complexes, consider pulmonary disease pattern.  Compared to EKG 05/13/2019, no significant change.  05/13/2019: Normal sinus rhythm at rate of 65 bpm, normal axis.  No evidence of ischemia, normal EKG.  Borderline low voltage complexes.  No significant change from 11/12/2018.  Assessment:      ICD-10-CM   1. Essential hypertension  Y24 Basic metabolic panel    Brain natriuretic peptide    2. Bilateral leg edema  M25.0 Basic metabolic panel    Brain natriuretic peptide       Recommendations:    [name]  is a  55 y.o. 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  Patient was referred back to our office for management of  hypertension and bradycardiaOn 01/04/2021.  Patient has been notified by home blood pressure monitor of bradycardic episodes, however she is asymptomatic therefore no changes were made at last office visit in regards to heart rate.  However given uncontrolled hypertension and recommendation by nephrology started valsartan 40 mg once daily, However patient developed worsening renal function therefore stopped valsartan and switch her to hydralazine 25 mg 3 times daily.  At last office visit noted new systolic murmur on exam, therefore ordered echocardiogram which revealed normal LVEF and no significant valvular disease. Patient now presents for 6-week follow up.  She is tolerating hydralazine without issue.  PCP recently monitors patient's blood pressure, which has improved since last office visit.  Will defer further management of hypertension to PCPs office.  Patient's physical exam remained stable.  We will obtain repeat BMP to reevaluate renal function since stopping valsartan.  We will also obtain BNP given continued bilateral leg edema, although this is stable. Advised patient to continue to follow with PCP and wound management for management of lymphedema.   Reviewed and discussed results of echocardiogram, patient's questions were answered to her satisfaction.  Follow-up in 6 months, sooner if needed, for hypertension, hyperlipidemia, leg edema.   Alethia Berthold, PA-C 02/15/2021, 9:09 AM Office: [phone]

## 2021-02-16 DIAGNOSIS — J45998 Other asthma: Secondary | ICD-10-CM | POA: Diagnosis not present

## 2021-02-17 ENCOUNTER — Encounter: Payer: Medicare Other | Attending: Internal Medicine | Admitting: Internal Medicine

## 2021-02-17 ENCOUNTER — Other Ambulatory Visit: Payer: Self-pay

## 2021-02-17 DIAGNOSIS — E1122 Type 2 diabetes mellitus with diabetic chronic kidney disease: Secondary | ICD-10-CM | POA: Diagnosis not present

## 2021-02-17 DIAGNOSIS — N183 Chronic kidney disease, stage 3 unspecified: Secondary | ICD-10-CM | POA: Diagnosis not present

## 2021-02-17 DIAGNOSIS — I509 Heart failure, unspecified: Secondary | ICD-10-CM | POA: Insufficient documentation

## 2021-02-17 DIAGNOSIS — I130 Hypertensive heart and chronic kidney disease with heart failure and stage 1 through stage 4 chronic kidney disease, or unspecified chronic kidney disease: Secondary | ICD-10-CM | POA: Diagnosis not present

## 2021-02-17 DIAGNOSIS — Z7689 Persons encountering health services in other specified circumstances: Secondary | ICD-10-CM | POA: Diagnosis not present

## 2021-02-17 DIAGNOSIS — I890 Lymphedema, not elsewhere classified: Secondary | ICD-10-CM

## 2021-02-17 DIAGNOSIS — L97929 Non-pressure chronic ulcer of unspecified part of left lower leg with unspecified severity: Secondary | ICD-10-CM | POA: Insufficient documentation

## 2021-02-17 DIAGNOSIS — E11622 Type 2 diabetes mellitus with other skin ulcer: Secondary | ICD-10-CM | POA: Insufficient documentation

## 2021-02-17 DIAGNOSIS — I872 Venous insufficiency (chronic) (peripheral): Secondary | ICD-10-CM | POA: Diagnosis not present

## 2021-02-17 NOTE — Progress Notes (Signed)
MARGRETTA, ZAMORANO ([id number]) Visit Report for 02/17/2021 Abuse/Suicide Risk Screen Details Patient Name: [name], [name] Date of Service: 02/17/2021 12:45 PM Medical Record Number: [id number] Patient Account Number: [id number] Date of Birth/Sex: 09-21-1965 (55 y.o. F) Treating RN: Donnamarie Poag Primary Care Charliee Krenz: Iona Beard Other Clinician: Referring Shantanique Hodo: Referral, Self Treating Fernie Grimm/Extender: Yaakov Guthrie in Treatment: 0 Abuse/Suicide Risk Screen Items Answer ABUSE RISK SCREEN: Has anyone close to you tried to hurt or harm you recentlyo No Do you feel uncomfortable with anyone in your familyo No Has anyone forced you do things that you didnot want to doo No Electronic Signature(s) Signed: 02/17/2021 4:04:38 PM By: Donnamarie Poag Entered By: Donnamarie Poag on 02/17/2021 13:06:05 Goswami, [name] ([id number]) -------------------------------------------------------------------------------- Activities of Daily Living Details Patient Name: [name] Date of Service: 02/17/2021 12:45 PM Medical Record Number: [id number] Patient Account Number: [id number] Date of Birth/Sex: June 16, 1965 (55 y.o. F) Treating RN: Donnamarie Poag Primary Care Boby Eyer: Iona Beard Other Clinician: Referring Maika Mcelveen: Referral, Self Treating Rafael Quesada/Extender: Yaakov Guthrie in Treatment: 0 Activities of Daily Living Items Answer Activities of Daily Living (Please select one for each item) Drive Automobile Completely Able Take Medications Completely Able Use Telephone Completely Able Care for Appearance Completely Able Use Toilet Completely Able Bath / Shower Completely Able Dress Self Completely Able Feed Self Completely Able Walk Completely Able Get In / Out Bed Completely Able Housework Completely Able Prepare Meals Completely Able Handle Money Completely Able Shop for Self Completely Able Electronic Signature(s) Signed: 02/17/2021 4:04:38 PM  By: Donnamarie Poag Entered By: Donnamarie Poag on 02/17/2021 13:06:28 Rosenboom, [name] ([id number]) -------------------------------------------------------------------------------- Education Screening Details Patient Name: [name] Date of Service: 02/17/2021 12:45 PM Medical Record Number: [id number] Patient Account Number: [id number] Date of Birth/Sex: [birth year] (55 y.o. F) Treating RN: Donnamarie Poag Primary Care Dannika Hilgeman: Iona Beard Other Clinician: Referring Shondale Quinley: Referral, Self Treating Arval Brandstetter/Extender: Yaakov Guthrie in Treatment: 0 Primary Learner Assessed: Patient Learning Preferences/Education Level/Primary Language Learning Preference: Explanation Highest Education Level: High School Preferred Language: English Cognitive Barrier Language Barrier: No Translator Needed: No Memory Deficit: No Emotional Barrier: No Cultural/Religious Beliefs Affecting Medical Care: No Physical Barrier Impaired Vision: No Impaired Hearing: No Decreased Hand dexterity: No Knowledge/Comprehension Knowledge Level: High Comprehension Level: High Ability to understand written instructions: High Ability to understand verbal instructions: High Motivation Anxiety Level: Calm Cooperation: Cooperative Education Importance: Acknowledges Need Interest in Health Problems: Asks Questions Perception: Coherent Willingness to Engage in Self-Management High Activities: Readiness to Engage in Self-Management High Activities: Electronic Signature(s) Signed: 02/17/2021 4:04:38 PM By: Donnamarie Poag Entered ByDonnamarie Poag on 02/17/2021 13:07:21 Meggett, [name] ([id number]) -------------------------------------------------------------------------------- Fall Risk Assessment Details Patient Name: [name] Date of Service: 02/17/2021 12:45 PM Medical Record Number: [id number] Patient Account Number: [id number] Date of Birth/Sex: May 18, 1965 (55 y.o.  F) Treating RN: Donnamarie Poag Primary Care Arial Galligan: Iona Beard Other Clinician: Referring Doaa Kendzierski: Referral, Self Treating Kelden Lavallee/Extender: Yaakov Guthrie in Treatment: 0 Fall Risk Assessment Items Have you had 2 or more falls in the last 12 monthso 0 No Have you had any fall that resulted in injury in the last 12 monthso 0 No FALLS RISK SCREEN History of falling - immediate or within 3 months 0 No Secondary diagnosis (Do you have 2 or more medical diagnoseso) 0 No Ambulatory aid None/bed rest/wheelchair/nurse 0 Yes Crutches/cane/walker 0 No Furniture 0 No Intravenous therapy Access/Saline/Heparin Lock 0 No Gait/Transferring Normal/ bed rest/ wheelchair 0 Yes Weak (short steps with or without shuffle, stooped  but able to lift head while walking, may 0 No seek support from furniture) Impaired (short steps with shuffle, may have difficulty arising from chair, head down, impaired 0 No balance) Mental Status Oriented to own ability 0 Yes Electronic Signature(s) Signed: 02/17/2021 4:04:38 PM By: Donnamarie Poag Entered By: Donnamarie Poag on 02/17/2021 13:07:36 Bathgate, [name] ([id number]) -------------------------------------------------------------------------------- Foot Assessment Details Patient Name: [name] Date of Service: 02/17/2021 12:45 PM Medical Record Number: [id number] Patient Account Number: [id number] Date of Birth/Sex: [birth year] (55 y.o. F) Treating RN: Donnamarie Poag Primary Care Kamaiyah Uselton: Iona Beard Other Clinician: Referring Kili Gracy: Referral, Self Treating Kathlee Barnhardt/Extender: Yaakov Guthrie in Treatment: 0 Foot Assessment Items Site Locations + = Sensation present, - = Sensation absent, C = Callus, U = Ulcer R = Redness, W = Warmth, M = Maceration, PU = Pre-ulcerative lesion F = Fissure, S = Swelling, D = Dryness Assessment Right: Left: Other Deformity: No No Prior Foot Ulcer: No No Prior Amputation: No No Charcot  Joint: No No Ambulatory Status: Ambulatory Without Help Gait: Steady Electronic Signature(s) Signed: 02/17/2021 4:04:38 PM By: Donnamarie Poag Entered By: Donnamarie Poag on 02/17/2021 13:08:25 Riebel, [name] ([id number]) -------------------------------------------------------------------------------- Nutrition Risk Screening Details Patient Name: [name] Date of Service: 02/17/2021 12:45 PM Medical Record Number: [id number] Patient Account Number: [id number] Date of Birth/Sex: [birth year] (54 y.o. F) Treating RN: Donnamarie Poag Primary Care Andora Krull: Iona Beard Other Clinician: Referring Braysen Cloward: Referral, Self Treating Dashiell Franchino/Extender: Yaakov Guthrie in Treatment: 0 Height (in): 60.5 Weight (lbs): 268 Body Mass Index (BMI): 51.5 Nutrition Risk Screening Items Score Screening NUTRITION RISK SCREEN: I have an illness or condition that made me change the kind and/or amount of food I eat 0 No I eat fewer than two meals per day 0 No I eat few fruits and vegetables, or milk products 0 No I have three or more drinks of beer, liquor or wine almost every day 0 No I have tooth or mouth problems that make it hard for me to eat 0 No I don't always have enough money to buy the food I need 0 No I eat alone most of the time 0 No I take three or more different prescribed or over-the-counter drugs a day 1 Yes Without wanting to, I have lost or gained 10 pounds in the last six months 0 No I am not always physically able to shop, cook and/or feed myself 0 No Nutrition Protocols Good Risk Protocol 0 No interventions needed Moderate Risk Protocol High Risk Proctocol Risk Level: Good Risk Score: 1 Electronic Signature(s) Signed: 02/17/2021 4:04:38 PM By: Donnamarie Poag Entered ByDonnamarie Poag on 02/17/2021 13:07:47

## 2021-02-17 NOTE — Progress Notes (Signed)
INOLA, LISLE ([id number]) Visit Report for 02/17/2021 Chief Complaint Document Details Patient Name: [name], [name] Date of Service: 02/17/2021 12:45 PM Medical Record Number: [id number] Patient Account Number: [id number] Date of Birth/Sex: 1965/07/02 (55 y.o. F) Treating RN: Donnamarie Poag Primary Care Provider: Iona Beard Other Clinician: Referring Provider: Referral, Self Treating Provider/Extender: Yaakov Guthrie in Treatment: 0 Information Obtained from: Patient Chief Complaint Evaluation for lymphedema pumps Electronic Signature(s) Signed: 02/17/2021 3:01:29 PM By: Kalman Shan DO Entered By: Kalman Shan on 02/17/2021 13:42:04 Kruer, [name] ([id number]) -------------------------------------------------------------------------------- HPI Details Patient Name: [name] Date of Service: 02/17/2021 12:45 PM Medical Record Number: [id number] Patient Account Number: [id number] Date of Birth/Sex: Mar 03, 1965 (55 y.o. F) Treating RN: Donnamarie Poag Primary Care Provider: Iona Beard Other Clinician: Referring Provider: Referral, Self Treating Provider/Extender: Yaakov Guthrie in Treatment: 0 History of Present Illness HPI Description: 10/20/2020 upon evaluation today patient appears to be doing somewhat poorly in regard to her left leg where she has a significant wound although not very deep but this is definitely due to her chronic swelling. Fortunately there does not appear to be any signs of active infection at this time which is great news. There was a lot of scabbing going on and then she had areas that were somewhat open up underneath. It does appear to be very lymphedema like in nature. With that being said I do not see any signs of active infection currently. I do think that based on what we are seeing at the moment this is likely an area that will respond well to appropriate compression therapy. If it does not then we  will need to reevaluate the situation for sure. The patient does have a history of diabetes mellitus type 2, chronic venous insufficiency, lymphedema, chronic kidney disease stage III, and congestive heart failure. She is status post bariatric surgery as well. Overall I am pleased with where things stand I think she does need to definitely have compression in place. This wound has been present for about 4 to 6 weeks she tells me. 10/29/2020 upon evaluation today patient actually appears to be doing much better. She has been tolerating the dressing changes without complication and the compression wrap did slide down her a bit but to be perfectly honest I am very pleased overall with well the wound appears today. I do not see any signs of active infection which is also great news. Nonetheless I think that we are definitely headed in the appropriate direction as far as healing is concerned and in fact this is significantly improved even as compared to just last week. 11/06/2020 upon evaluation today patient appears to be doing well with regard to her wounds. In fact everything appears to be completely healed which is great news. I am extremely pleased with where things stand today. No fevers, chills, nausea, vomiting, or diarrhea. 02/17/2021 [name] is a 55 year old female with a past medical history of insulin-dependent type 2 diabetes, CKD stage III, bariatric surgery and lymphedema that presents to the clinic because she would like to order lymphedema pumps. She was seen in our clinic in August and September for an open wound to her left lower extremity due to lymphedema. She was treated With 4-layer compression for 2-1/2 weeks with resolution of her wound. She has not developed wounds since her discharge in September. She has compression stockings but states she cannot put these on daily. Electronic Signature(s) Signed: 02/17/2021 3:01:29 PM By: Kalman Shan DO Entered By: [place],  Margan Elias on 02/17/2021 13:45:49 LAVANA, HUCKEBA ([id number]) -------------------------------------------------------------------------------- Physical Exam Details Patient Name: [name], [name] Date of Service: 02/17/2021 12:45 PM Medical Record Number: [id number] Patient Account Number: [id number] Date of Birth/Sex: [birth year] (55 y.o. F) Treating RN: Donnamarie Poag Primary Care Provider: Iona Beard Other Clinician: Referring Provider: Referral, Self Treating Provider/Extender: Yaakov Guthrie in Treatment: 0 Constitutional . Cardiovascular . Psychiatric . Notes Lymphedema bilaterally, nonpitting edema to the knee. No open wounds Electronic Signature(s) Signed: 02/17/2021 3:01:29 PM By: Kalman Shan DO Entered By: Kalman Shan on 02/17/2021 13:46:25 Harcum, [name] ([id number]) -------------------------------------------------------------------------------- Physician Orders Details Patient Name: [name] Date of Service: 02/17/2021 12:45 PM Medical Record Number: [id number] Patient Account Number: [id number] Date of Birth/Sex: 1965-05-23 (55 y.o. F) Treating RN: Donnamarie Poag Primary Care Provider: Iona Beard Other Clinician: Referring Provider: Referral, Self Treating Provider/Extender: Yaakov Guthrie in Treatment: 0 Verbal / Phone Orders: No Diagnosis Coding Discharge From [facility] Only o Elevate, Exercise Daily and Avoid Standing for Long Periods of Time. o DO YOUR BEST to sleep in the bed at night. DO NOT sleep in your recliner. Long hours of sitting in a recliner leads to swelling of the legs and/or potential wounds on your backside. Edema Control - Lymphedema / Segmental Compressive Device / Other o Patient to wear own compression stockings. Remove compression stockings every night before going to bed and put on every morning when getting up. o Compression Pump: Use compression pump on left  lower extremity for 60 minutes, twice daily. - Orders sent today o Compression Pump: Use compression pump on right lower extremity for 60 minutes, twice daily. - Orders sent today Consults o Vascular - ABI/TBI Electronic Signature(s) Signed: 02/17/2021 3:01:29 PM By: Kalman Shan DO Signed: 02/17/2021 4:04:38 PM By: Donnamarie Poag Entered By: Donnamarie Poag on 02/17/2021 14:18:38 Bankson, [name] ([id number]) -------------------------------------------------------------------------------- Problem List Details Patient Name: [name] Date of Service: 02/17/2021 12:45 PM Medical Record Number: [id number] Patient Account Number: [id number] Date of Birth/Sex: 1966/01/17 (55 y.o. F) Treating RN: Donnamarie Poag Primary Care Provider: Iona Beard Other Clinician: Referring Provider: Referral, Self Treating Provider/Extender: Yaakov Guthrie in Treatment: 0 Active Problems ICD-10 Encounter Code Description Active Date MDM Diagnosis I89.0 Lymphedema, not elsewhere classified 02/17/2021 No Yes I87.2 Venous insufficiency (chronic) (peripheral) 02/17/2021 No Yes Inactive Problems Resolved Problems Electronic Signature(s) Signed: 02/17/2021 3:01:29 PM By: Kalman Shan DO Entered By: Kalman Shan on 02/17/2021 14:29:26 Safer, [name] ([id number]) -------------------------------------------------------------------------------- Progress Note Details Patient Name: [name] Date of Service: 02/17/2021 12:45 PM Medical Record Number: [id number] Patient Account Number: [id number] Date of Birth/Sex: 09/26/1965 (55 y.o. F) Treating RN: Donnamarie Poag Primary Care Provider: Iona Beard Other Clinician: Referring Provider: Referral, Self Treating Provider/Extender: Yaakov Guthrie in Treatment: 0 Subjective Chief Complaint Information obtained from Patient Evaluation for lymphedema pumps History of Present Illness (HPI) 10/20/2020 upon  evaluation today patient appears to be doing somewhat poorly in regard to her left leg where she has a significant wound although not very deep but this is definitely due to her chronic swelling. Fortunately there does not appear to be any signs of active infection at this time which is great news. There was a lot of scabbing going on and then she had areas that were somewhat open up underneath. It does appear to be very lymphedema like in nature. With that being said I do not see any signs of active infection currently. I do think that based on what we  are seeing at the moment this is likely an area that will respond well to appropriate compression therapy. If it does not then we will need to reevaluate the situation for sure. The patient does have a history of diabetes mellitus type 2, chronic venous insufficiency, lymphedema, chronic kidney disease stage III, and congestive heart failure. She is status post bariatric surgery as well. Overall I am pleased with where things stand I think she does need to definitely have compression in place. This wound has been present for about 4 to 6 weeks she tells me. 10/29/2020 upon evaluation today patient actually appears to be doing much better. She has been tolerating the dressing changes without complication and the compression wrap did slide down her a bit but to be perfectly honest I am very pleased overall with well the wound appears today. I do not see any signs of active infection which is also great news. Nonetheless I think that we are definitely headed in the appropriate direction as far as healing is concerned and in fact this is significantly improved even as compared to just last week. 11/06/2020 upon evaluation today patient appears to be doing well with regard to her wounds. In fact everything appears to be completely healed which is great news. I am extremely pleased with where things stand today. No fevers, chills, nausea, vomiting, or  diarrhea. 02/17/2021 [name] is a 55 year old female with a past medical history of insulin-dependent type 2 diabetes, CKD stage III, bariatric surgery and lymphedema that presents to the clinic because she would like to order lymphedema pumps. She was seen in our clinic in August and September for an open wound to her left lower extremity due to lymphedema. She was treated With 4-layer compression for 2-1/2 weeks with resolution of her wound. She has not developed wounds since her discharge in September. She has compression stockings but states she cannot put these on daily. Patient History Information obtained from Patient. Allergies No Known Allergies Social History Never smoker, Marital Status - Single, Alcohol Use - Rarely, Drug Use - No History, Caffeine Use - Rarely. Medical History Eyes Patient has history of Cataracts - hx Hematologic/Lymphatic Patient has history of Lymphedema Respiratory Patient has history of Asthma Cardiovascular Patient has history of Congestive Heart Failure, Hypertension Endocrine Patient has history of Type II Diabetes Musculoskeletal Patient has history of Osteoarthritis Neurologic Patient has history of Neuropathy Patient is treated with Insulin, Oral Agents. Blood sugar is tested. Blood sugar results noted at the following times: Lunch - 114. Review of Systems (ROS) Constitutional Symptoms (General Health) Denies complaints or symptoms of Fatigue, Fever, Chills, Marked Weight Change. Respiratory Complains or has symptoms of Shortness of Breath. ADILYNN, BESSEY ([id number]) Cardiovascular Complains or has symptoms of LE edema, follows cardiology Gastrointestinal Denies complaints or symptoms of Frequent diarrhea, Nausea, Vomiting, 2020 gastric bypass surgery Endocrine Complains or has symptoms of Thyroid disease. Genitourinary hx ARF/follows nephrology Immunological Denies complaints or symptoms of Hives,  Itching. Integumentary (Skin) Complains or has symptoms of Wounds - lower legs, Swelling. Psychiatric Denies complaints or symptoms of Anxiety, Claustrophobia. Objective Constitutional Vitals Time Taken: 12:55 PM, Height: 60.5 in, Source: Stated, Weight: 268 lbs, Source: Measured, BMI: 51.5, Temperature: 98.2 F, Pulse: 58 bpm, Respiratory Rate: 16 breaths/min, Blood Pressure: 136/77 mmHg. General Notes: Lymphedema bilaterally, nonpitting edema to the knee. No open wounds Assessment Active Problems ICD-10 Lymphedema, not elsewhere classified Venous insufficiency (chronic) (peripheral) Patient has stage II lymphedema Secondary to venous insufficiency. She has a  history of ulcer to the left lower extremity that was treated with 2-1/2 weeks of 4-layer compression in 09/2020. She does not have a wound at this time. For the past 3 months she has tried compression stockings but has a hard time putting these on. She also tries to elevate her legs and stay active. Despite this she still presents with stage II lymphedema. Her ABI on the left is 1.27 and on the right is 1.47. Due to noncompressible ABIs on the right we will obtain formal ABIs with TBI's. If no significant arterial disease noted patient would benefit from lymphedema pumps. Plan Discharge From [facility] Services: Consult Only Elevate, Exercise Daily and Avoid Standing for Long Periods of Time. DO YOUR BEST to sleep in the bed at night. DO NOT sleep in your recliner. Long hours of sitting in a recliner leads to swelling of the legs and/or potential wounds on your backside. Edema Control - Lymphedema / Segmental Compressive Device / Other: Patient to wear own compression stockings. Remove compression stockings every night before going to bed and put on every morning when getting up. Compression Pump: Use compression pump on left lower extremity for 60 minutes, twice daily. - Orders sent today Compression Pump: Use compression pump on right  lower extremity for 60 minutes, twice daily. - Orders sent today Consults ordered were: Vascular - ABI/TBI [name], Lori R. ([id number]) 1. ABIs with TBI's 2. If results normal then we will order lymphedema pumps 3. Follow-up as needed Electronic Signature(s) Signed: 02/17/2021 3:01:29 PM By: Kalman Shan DO Entered By: Kalman Shan on 02/17/2021 14:29:45 Mervin, [name] ([id number]) -------------------------------------------------------------------------------- ROS/PFSH Details Patient Name: [name] Date of Service: 02/17/2021 12:45 PM Medical Record Number: [id number] Patient Account Number: [id number] Date of Birth/Sex: 1965-07-17 (55 y.o. F) Treating RN: Donnamarie Poag Primary Care Provider: Iona Beard Other Clinician: Referring Provider: Referral, Self Treating Provider/Extender: Yaakov Guthrie in Treatment: 0 Information Obtained From Patient Constitutional Symptoms (General Health) Complaints and Symptoms: Negative for: Fatigue; Fever; Chills; Marked Weight Change Respiratory Complaints and Symptoms: Positive for: Shortness of Breath Medical History: Positive for: Asthma Cardiovascular Complaints and Symptoms: Positive for: LE edema Review of System Notes: follows cardiology Medical History: Positive for: Congestive Heart Failure; Hypertension Gastrointestinal Complaints and Symptoms: Negative for: Frequent diarrhea; Nausea; Vomiting Review of System Notes: 2020 gastric bypass surgery Endocrine Complaints and Symptoms: Positive for: Thyroid disease Medical History: Positive for: Type II Diabetes Time with diabetes: 70 years Treated with: Insulin, Oral agents Blood sugar tested every day: Yes Tested : daily Blood sugar testing results: Lunch: 114 Immunological Complaints and Symptoms: Negative for: Hives; Itching Integumentary (Skin) Complaints and Symptoms: Positive for: Wounds - lower legs;  Swelling Psychiatric [name], [name] ([id number]) Complaints and Symptoms: Negative for: Anxiety; Claustrophobia Eyes Medical History: Positive for: Cataracts - hx Ear/Nose/Mouth/Throat Hematologic/Lymphatic Medical History: Positive for: Lymphedema Genitourinary Complaints and Symptoms: Review of System Notes: hx ARF/follows nephrology Musculoskeletal Medical History: Positive for: Osteoarthritis Neurologic Medical History: Positive for: Neuropathy Oncologic HBO Extended History Items Eyes: Cataracts Immunizations Pneumococcal Vaccine: Received Pneumococcal Vaccination: No Implantable Devices None Family and Social History Never smoker; Marital Status - Single; Alcohol Use: Rarely; Drug Use: No History; Caffeine Use: Rarely; Financial Concerns: No; Food, Clothing or Shelter Needs: No; Support System Lacking: No; Transportation Concerns: No Electronic Signature(s) Signed: 02/17/2021 3:01:29 PM By: Kalman Shan DO Signed: 02/17/2021 4:04:38 PM By: Donnamarie Poag Entered By: Donnamarie Poag on 02/17/2021 13:05:53 [place], [name] ([id number]) -------------------------------------------------------------------------------- [place] Details Patient Name: Lori Russell  R. Date of Service: 02/17/2021 Medical Record Number: [id number] Patient Account Number: [id number] Date of Birth/Sex: [birth year] (55 y.o. F) Treating RN: Donnamarie Poag Primary Care Provider: Iona Beard Other Clinician: Referring Provider: Referral, Self Treating Provider/Extender: Yaakov Guthrie in Treatment: 0 Diagnosis Coding ICD-10 Codes Code Description I89.0 Lymphedema, not elsewhere classified I87.2 Venous insufficiency (chronic) (peripheral) Facility Procedures CPT4 Code: [id number] Description: 03709 - WOUND CARE VISIT-LEV 3 EST PT Modifier: Quantity: 1 Physician Procedures CPT4 Code: [id number] Description: 84037 - WC PHYS LEVEL 3 - EST PT Modifier: Quantity: 1 CPT4  Code: Description: ICD-10 Diagnosis Description I89.0 Lymphedema, not elsewhere classified I87.2 Venous insufficiency (chronic) (peripheral) Modifier: Quantity: Electronic Signature(s) Signed: 02/17/2021 3:01:29 PM By: Kalman Shan DO Entered By: Kalman Shan on 02/17/2021 14:30:04

## 2021-02-17 NOTE — Progress Notes (Signed)
[name], [name] ([id number]) Visit Report for 02/17/2021 Allergy List Details Patient Name: [name], [name] Date of Service: 02/17/2021 12:45 PM Medical Record Number: [id number] Patient Account Number: [id number] Date of Birth/Sex: 1965-05-11 (55 y.o. F) Treating RN: Donnamarie Poag Primary Care Teyanna Thielman: Iona Beard Other Clinician: Referring Brennyn Ortlieb: Referral, Self Treating Briyonna Omara/Extender: Yaakov Guthrie in Treatment: 0 Allergies Active Allergies No Known Allergies Allergy Notes Electronic Signature(s) Signed: 02/17/2021 4:04:38 PM By: Donnamarie Poag Entered By: Donnamarie Poag on 02/17/2021 13:00:45 [name], [name] ([id number]) -------------------------------------------------------------------------------- Arrival Information Details Patient Name: [name] Date of Service: 02/17/2021 12:45 PM Medical Record Number: [id number] Patient Account Number: [id number] Date of Birth/Sex: 1965/03/17 (55 y.o. F) Treating RN: Donnamarie Poag Primary Care Elius Etheredge: Iona Beard Other Clinician: Referring Jameelah Watts: Referral, Self Treating Azuri Bozard/Extender: Yaakov Guthrie in Treatment: 0 Visit Information Patient Arrived: Ambulatory Arrival Time: 12:52 Accompanied By: self Transfer Assistance: None Patient Identification Verified: Yes Secondary Verification Process Completed: Yes Patient Requires Transmission-Based Precautions: No Patient Has Alerts: Yes Patient Alerts: DIABETIC ABI 8/22 Left 1.16 [place] bil ABI History Since Last Visit Electronic Signature(s) Signed: 02/17/2021 2:20:12 PM By: Donnamarie Poag Entered By: Donnamarie Poag on 02/17/2021 14:20:12 [name], [name] ([id number]) -------------------------------------------------------------------------------- Clinic Level of Care Assessment Details Patient Name: [name] Date of Service: 02/17/2021 12:45 PM Medical Record Number: [id number] Patient Account Number:  [id number] Date of Birth/Sex: 1966-02-06 (55 y.o. F) Treating RN: Donnamarie Poag Primary Care Leeya Rusconi: Iona Beard Other Clinician: Referring Brecklynn Jian: Referral, Self Treating Brynnlie Unterreiner/Extender: Yaakov Guthrie in Treatment: 0 Clinic Level of Care Assessment Items TOOL 2 Quantity Score []  - Use when only an EandM is performed on the INITIAL visit 0 ASSESSMENTS - Nursing Assessment / Reassessment X - General Physical Exam (combine w/ comprehensive assessment (listed just below) when performed on new 1 20 pt. evals) X- 1 25 Comprehensive Assessment (HX, ROS, Risk Assessments, Wounds Hx, etc.) ASSESSMENTS - Wound and Skin Assessment / Reassessment []  - Simple Wound Assessment / Reassessment - one wound 0 []  - 0 Complex Wound Assessment / Reassessment - multiple wounds []  - 0 Dermatologic / Skin Assessment (not related to wound area) ASSESSMENTS - Ostomy and/or Continence Assessment and Care []  - Incontinence Assessment and Management 0 []  - 0 Ostomy Care Assessment and Management (repouching, etc.) PROCESS - Coordination of Care X - Simple Patient / Family Education for ongoing care 1 15 []  - 0 Complex (extensive) Patient / Family Education for ongoing care []  - 0 Staff obtains Programmer, systems, Records, Test Results / Process Orders []  - 0 Staff telephones HHA, Nursing Homes / Clarify orders / etc []  - 0 Routine Transfer to another Facility (non-emergent condition) []  - 0 Routine Hospital Admission (non-emergent condition) X- 1 15 New Admissions / Biomedical engineer / Ordering NPWT, Apligraf, etc. []  - 0 Emergency Hospital Admission (emergent condition) X- 1 10 Simple Discharge Coordination []  - 0 Complex (extensive) Discharge Coordination PROCESS - Special Needs []  - Pediatric / Minor Patient Management 0 []  - 0 Isolation Patient Management []  - 0 Hearing / Language / Visual special needs []  - 0 Assessment of Community assistance (transportation, D/C planning,  etc.) []  - 0 Additional assistance / Altered mentation []  - 0 Support Surface(s) Assessment (bed, cushion, seat, etc.) INTERVENTIONS - Wound Cleansing / Measurement X - Wound Imaging (photographs - any number of wounds) 1 5 []  - 0 Wound Tracing (instead of photographs) []  - 0 Simple Wound Measurement - one wound []  - 0 Complex Wound Measurement - multiple  wounds [name], [name] ([id number]) []  - 0 Simple Wound Cleansing - one wound []  - 0 Complex Wound Cleansing - multiple wounds INTERVENTIONS - Wound Dressings []  - Small Wound Dressing one or multiple wounds 0 []  - 0 Medium Wound Dressing one or multiple wounds []  - 0 Large Wound Dressing one or multiple wounds []  - 0 Application of Medications - injection INTERVENTIONS - Miscellaneous []  - External ear exam 0 []  - 0 Specimen Collection (cultures, biopsies, blood, body fluids, etc.) []  - 0 Specimen(s) / Culture(s) sent or taken to Lab for analysis []  - 0 Patient Transfer (multiple staff / Harrel Lemon Lift / Similar devices) []  - 0 Simple Staple / Suture removal (25 or less) []  - 0 Complex Staple / Suture removal (26 or more) []  - 0 Hypo / Hyperglycemic Management (close monitor of Blood Glucose) X- 1 15 Ankle / Brachial Index (ABI) - do not check if billed separately Has the patient been seen at the hospital within the last three years: Yes Total Score: 105 Level Of Care: New/Established - Level 3 Electronic Signature(s) Signed: 02/17/2021 4:04:38 PM By: Donnamarie Poag Entered By: Donnamarie Poag on 02/17/2021 13:36:33 [name], [name] ([id number]) -------------------------------------------------------------------------------- Encounter Discharge Information Details Patient Name: [name] Date of Service: 02/17/2021 12:45 PM Medical Record Number: [id number] Patient Account Number: [id number] Date of Birth/Sex: 1965/11/19 (55 y.o. F) Treating RN: Donnamarie Poag Primary Care Alban Marucci: Iona Beard  Other Clinician: Referring Oscar Hank: Referral, Self Treating Jerelyn Trimarco/Extender: Yaakov Guthrie in Treatment: 0 Encounter Discharge Information Items Discharge Condition: Stable Ambulatory Status: Ambulatory Discharge Destination: Home Transportation: Private Auto Accompanied By: self Schedule Follow-up Appointment: Yes Clinical Summary of Care: Electronic Signature(s) Signed: 02/17/2021 4:04:38 PM By: Donnamarie Poag Entered By: Donnamarie Poag on 02/17/2021 13:47:34 Taaffe, [name] ([id number]) -------------------------------------------------------------------------------- Lower Extremity Assessment Details Patient Name: [name] Date of Service: 02/17/2021 12:45 PM Medical Record Number: [id number] Patient Account Number: [id number] Date of Birth/Sex: 1965-07-02 (55 y.o. F) Treating RN: Donnamarie Poag Primary Care Paislynn Hegstrom: Iona Beard Other Clinician: Referring Adiana Smelcer: Referral, Self Treating Raiden Haydu/Extender: Yaakov Guthrie in Treatment: 0 Edema Assessment Assessed: [Left: Yes] [Right: Yes] Edema: [Left: Yes] [Right: Yes] Calf Left: Right: Point of Measurement: 26 cm From Medial Instep 50 cm 47.5 cm Ankle Left: Right: Point of Measurement: 12 cm From Medial Instep 34 cm 29.5 cm Knee To Floor Left: Right: From Medial Instep 41 cm 41 cm Vascular Assessment Pulses: Dorsalis Pedis Palpable: [Left:Yes] [Right:Yes] Blood Pressure: Brachial: [Left:148] [Right:148] Ankle: [Left:Dorsalis Pedis: 188 1.27] [Right:Dorsalis Pedis: 218 1.47] Electronic Signature(s) Signed: 02/17/2021 4:04:38 PM By: Donnamarie Poag Entered By: Donnamarie Poag on 02/17/2021 13:42:44 Deasis, [name] ([id number]) -------------------------------------------------------------------------------- Multi Wound Chart Details Patient Name: [name] Date of Service: 02/17/2021 12:45 PM Medical Record Number: [id number] Patient Account Number: [id number] Date of  Birth/Sex: Jul 09, 1965 (55 y.o. F) Treating RN: Donnamarie Poag Primary Care Paden Kuras: Iona Beard Other Clinician: Referring Floella Ensz: Referral, Self Treating Erion Hermans/Extender: Yaakov Guthrie in Treatment: 0 Vital Signs Height(in): 60.5 Pulse(bpm): 58 Weight(lbs): 268 Blood Pressure(mmHg): 136/77 Body Mass Index(BMI): 51 Temperature(F): 98.2 Respiratory Rate(breaths/min): 16 Wound Assessments Treatment Notes Notes No open wounds Electronic Signature(s) Signed: 02/17/2021 3:01:29 PM By: Kalman Shan DO Entered By: Kalman Shan on 02/17/2021 13:41:25 Stimmel, [name] ([id number]) -------------------------------------------------------------------------------- [place] Details Patient Name: [name] Date of Service: 02/17/2021 12:45 PM Medical Record Number: [id number] Patient Account Number: [id number] Date of Birth/Sex: 1965/04/23 (54 y.o. F) Treating RN: Donnamarie Poag Primary Care Venson Ferencz: Iona Beard  Other Clinician: Referring Antwonette Feliz: Referral, Self Treating Mieczyslaw Stamas/Extender: Yaakov Guthrie in Treatment: 0 Active Inactive Electronic Signature(s) Signed: 02/17/2021 4:04:38 PM By: Donnamarie Poag Entered By: Donnamarie Poag on 02/17/2021 13:13:09 Michael, [name] ([id number]) -------------------------------------------------------------------------------- Non-Wound Condition Assessment Details Patient Name: [name] Date of Service: 02/17/2021 12:45 PM Medical Record Number: [id number] Patient Account Number: [id number] Date of Birth/Sex: 28-Feb-1966 (55 y.o. F) Treating RN: Donnamarie Poag Primary Care Uilani Sanville: Iona Beard Other Clinician: Referring Laurann Mcmorris: Referral, Self Treating Yousra Ivens/Extender: Yaakov Guthrie in Treatment: 0 Non-Wound Condition: Condition: Lymphedema Location: Leg Side: Bilateral Photos Electronic Signature(s) Signed: 02/17/2021 4:04:38 PM By: Donnamarie Poag Entered By: Donnamarie Poag on 02/17/2021 13:09:09 [place], [name] ([id number]) -------------------------------------------------------------------------------- Pain Assessment Details Patient Name: [name] Date of Service: 02/17/2021 12:45 PM Medical Record Number: [id number] Patient Account Number: [id number] Date of Birth/Sex: 1966-02-04 (55 y.o. F) Treating RN: Donnamarie Poag Primary Care Jalyric Kaestner: Iona Beard Other Clinician: Referring Eldo Umanzor: Referral, Self Treating Usiel Astarita/Extender: Yaakov Guthrie in Treatment: 0 Active Problems Location of Pain Severity and Description of Pain Patient Has Paino No Site Locations Rate the pain. Current Pain Level: 0 Pain Management and Medication Current Pain Management: Notes not at this time Electronic Signature(s) Signed: 02/17/2021 4:04:38 PM By: Donnamarie Poag Entered By: Donnamarie Poag on 02/17/2021 12:58:29 Collet, [name] ([id number]) -------------------------------------------------------------------------------- Patient/Caregiver Education Details Patient Name: [name] Date of Service: 02/17/2021 12:45 PM Medical Record Number: [id number] Patient Account Number: [id number] Date of Birth/Gender: [birth year] (55 y.o. F) Treating RN: Donnamarie Poag Primary Care Physician: Iona Beard Other Clinician: Referring Physician: Referral, Self Treating Physician/Extender: Yaakov Guthrie in Treatment: 0 Education Assessment Education Provided To: Patient Education Topics Provided Basic Hygiene: Elevated Blood Sugar/ Impact on Healing: Venous: Electronic Signature(s) Signed: 02/17/2021 4:04:38 PM By: Donnamarie Poag Entered By: Donnamarie Poag on 02/17/2021 13:36:53 Yeley, [name] ([id number]) -------------------------------------------------------------------------------- [place] Details Patient Name: [name] Date of Service: 02/17/2021 12:45 PM Medical Record  Number: [id number] Patient Account Number: [id number] Date of Birth/Sex: 1965/05/20 (55 y.o. F) Treating RN: Donnamarie Poag Primary Care Adaysha Dubinsky: Iona Beard Other Clinician: Referring Neysha Criado: Referral, Self Treating Yahsir Wickens/Extender: Yaakov Guthrie in Treatment: 0 Vital Signs Time Taken: 12:55 Temperature (F): 98.2 Height (in): 60.5 Pulse (bpm): 58 Source: Stated Respiratory Rate (breaths/min): 16 Weight (lbs): 268 Blood Pressure (mmHg): 136/77 Source: Measured Reference Range: 80 - 120 mg / dl Body Mass Index (BMI): 51.5 Electronic Signature(s) Signed: 02/17/2021 4:04:38 PM By: Donnamarie Poag Entered ByDonnamarie Poag on 02/17/2021 13:00:31

## 2021-02-25 ENCOUNTER — Other Ambulatory Visit (INDEPENDENT_AMBULATORY_CARE_PROVIDER_SITE_OTHER): Payer: Self-pay | Admitting: Internal Medicine

## 2021-02-25 DIAGNOSIS — R6889 Other general symptoms and signs: Secondary | ICD-10-CM

## 2021-02-25 DIAGNOSIS — I890 Lymphedema, not elsewhere classified: Secondary | ICD-10-CM

## 2021-02-25 DIAGNOSIS — I872 Venous insufficiency (chronic) (peripheral): Secondary | ICD-10-CM

## 2021-02-26 DIAGNOSIS — E039 Hypothyroidism, unspecified: Secondary | ICD-10-CM | POA: Diagnosis not present

## 2021-02-26 DIAGNOSIS — I129 Hypertensive chronic kidney disease with stage 1 through stage 4 chronic kidney disease, or unspecified chronic kidney disease: Secondary | ICD-10-CM | POA: Diagnosis not present

## 2021-02-26 DIAGNOSIS — E1122 Type 2 diabetes mellitus with diabetic chronic kidney disease: Secondary | ICD-10-CM | POA: Diagnosis not present

## 2021-02-26 DIAGNOSIS — N1832 Chronic kidney disease, stage 3b: Secondary | ICD-10-CM | POA: Diagnosis not present

## 2021-03-02 DIAGNOSIS — E1122 Type 2 diabetes mellitus with diabetic chronic kidney disease: Secondary | ICD-10-CM | POA: Diagnosis not present

## 2021-03-02 DIAGNOSIS — R809 Proteinuria, unspecified: Secondary | ICD-10-CM | POA: Diagnosis not present

## 2021-03-02 DIAGNOSIS — D631 Anemia in chronic kidney disease: Secondary | ICD-10-CM | POA: Diagnosis not present

## 2021-03-02 DIAGNOSIS — R600 Localized edema: Secondary | ICD-10-CM | POA: Diagnosis not present

## 2021-03-02 DIAGNOSIS — I10 Essential (primary) hypertension: Secondary | ICD-10-CM | POA: Diagnosis not present

## 2021-03-02 DIAGNOSIS — N189 Chronic kidney disease, unspecified: Secondary | ICD-10-CM | POA: Diagnosis not present

## 2021-03-02 DIAGNOSIS — E211 Secondary hyperparathyroidism, not elsewhere classified: Secondary | ICD-10-CM | POA: Diagnosis not present

## 2021-03-03 LAB — BASIC METABOLIC PANEL
BUN/Creatinine Ratio: 30 — ABNORMAL HIGH (ref 9–23)
BUN: 51 mg/dL — ABNORMAL HIGH (ref 6–24)
CO2: 24 mmol/L (ref 20–29)
Calcium: 9.2 mg/dL (ref 8.7–10.2)
Chloride: 106 mmol/L (ref 96–106)
Creatinine, Ser: 1.68 mg/dL — ABNORMAL HIGH (ref 0.57–1.00)
Glucose: 87 mg/dL (ref 70–99)
Potassium: 4.1 mmol/L (ref 3.5–5.2)
Sodium: 144 mmol/L (ref 134–144)
eGFR: 36 mL/min/{1.73_m2} — ABNORMAL LOW (ref >59)

## 2021-03-03 LAB — BRAIN NATRIURETIC PEPTIDE: BNP: 144.6 pg/mL — ABNORMAL HIGH (ref 0.0–100.0)

## 2021-03-04 DIAGNOSIS — E1140 Type 2 diabetes mellitus with diabetic neuropathy, unspecified: Secondary | ICD-10-CM | POA: Diagnosis not present

## 2021-03-04 DIAGNOSIS — E108 Type 1 diabetes mellitus with unspecified complications: Secondary | ICD-10-CM | POA: Diagnosis not present

## 2021-03-04 DIAGNOSIS — L110 Acquired keratosis follicularis: Secondary | ICD-10-CM | POA: Diagnosis not present

## 2021-03-04 DIAGNOSIS — L609 Nail disorder, unspecified: Secondary | ICD-10-CM | POA: Diagnosis not present

## 2021-03-04 DIAGNOSIS — Z794 Long term (current) use of insulin: Secondary | ICD-10-CM | POA: Diagnosis not present

## 2021-03-05 NOTE — Progress Notes (Signed)
Pt I aware of results

## 2021-03-05 NOTE — Progress Notes (Signed)
Called pt, but pt was not at home

## 2021-03-10 ENCOUNTER — Ambulatory Visit (INDEPENDENT_AMBULATORY_CARE_PROVIDER_SITE_OTHER): Payer: Commercial Managed Care - HMO

## 2021-03-10 ENCOUNTER — Other Ambulatory Visit: Payer: Self-pay

## 2021-03-10 DIAGNOSIS — R6889 Other general symptoms and signs: Secondary | ICD-10-CM

## 2021-03-10 DIAGNOSIS — I890 Lymphedema, not elsewhere classified: Secondary | ICD-10-CM

## 2021-03-10 DIAGNOSIS — I872 Venous insufficiency (chronic) (peripheral): Secondary | ICD-10-CM | POA: Diagnosis not present

## 2021-03-16 DIAGNOSIS — I129 Hypertensive chronic kidney disease with stage 1 through stage 4 chronic kidney disease, or unspecified chronic kidney disease: Secondary | ICD-10-CM | POA: Diagnosis not present

## 2021-03-16 DIAGNOSIS — R809 Proteinuria, unspecified: Secondary | ICD-10-CM | POA: Diagnosis not present

## 2021-03-16 DIAGNOSIS — E870 Hyperosmolality and hypernatremia: Secondary | ICD-10-CM | POA: Diagnosis not present

## 2021-03-16 DIAGNOSIS — E1122 Type 2 diabetes mellitus with diabetic chronic kidney disease: Secondary | ICD-10-CM | POA: Diagnosis not present

## 2021-03-16 DIAGNOSIS — E1129 Type 2 diabetes mellitus with other diabetic kidney complication: Secondary | ICD-10-CM | POA: Diagnosis not present

## 2021-03-16 DIAGNOSIS — D631 Anemia in chronic kidney disease: Secondary | ICD-10-CM | POA: Diagnosis not present

## 2021-03-16 DIAGNOSIS — E211 Secondary hyperparathyroidism, not elsewhere classified: Secondary | ICD-10-CM | POA: Diagnosis not present

## 2021-03-16 DIAGNOSIS — N19 Unspecified kidney failure: Secondary | ICD-10-CM | POA: Diagnosis not present

## 2021-03-16 DIAGNOSIS — N189 Chronic kidney disease, unspecified: Secondary | ICD-10-CM | POA: Diagnosis not present

## 2021-03-18 DIAGNOSIS — E108 Type 1 diabetes mellitus with unspecified complications: Secondary | ICD-10-CM | POA: Diagnosis not present

## 2021-03-18 DIAGNOSIS — Z794 Long term (current) use of insulin: Secondary | ICD-10-CM | POA: Diagnosis not present

## 2021-03-19 DIAGNOSIS — J45998 Other asthma: Secondary | ICD-10-CM | POA: Diagnosis not present

## 2021-03-28 DIAGNOSIS — E1122 Type 2 diabetes mellitus with diabetic chronic kidney disease: Secondary | ICD-10-CM | POA: Diagnosis not present

## 2021-03-28 DIAGNOSIS — E039 Hypothyroidism, unspecified: Secondary | ICD-10-CM | POA: Diagnosis not present

## 2021-03-28 DIAGNOSIS — N1831 Chronic kidney disease, stage 3a: Secondary | ICD-10-CM | POA: Diagnosis not present

## 2021-03-28 DIAGNOSIS — I129 Hypertensive chronic kidney disease with stage 1 through stage 4 chronic kidney disease, or unspecified chronic kidney disease: Secondary | ICD-10-CM | POA: Diagnosis not present

## 2021-03-29 DIAGNOSIS — I890 Lymphedema, not elsewhere classified: Secondary | ICD-10-CM | POA: Diagnosis not present

## 2021-03-30 DIAGNOSIS — R809 Proteinuria, unspecified: Secondary | ICD-10-CM | POA: Diagnosis not present

## 2021-03-30 DIAGNOSIS — D631 Anemia in chronic kidney disease: Secondary | ICD-10-CM | POA: Diagnosis not present

## 2021-03-30 DIAGNOSIS — E1122 Type 2 diabetes mellitus with diabetic chronic kidney disease: Secondary | ICD-10-CM | POA: Diagnosis not present

## 2021-03-30 DIAGNOSIS — E211 Secondary hyperparathyroidism, not elsewhere classified: Secondary | ICD-10-CM | POA: Diagnosis not present

## 2021-03-30 DIAGNOSIS — N189 Chronic kidney disease, unspecified: Secondary | ICD-10-CM | POA: Diagnosis not present

## 2021-03-31 ENCOUNTER — Other Ambulatory Visit: Payer: Self-pay

## 2021-03-31 ENCOUNTER — Encounter: Payer: Medicare Other | Attending: Internal Medicine | Admitting: Internal Medicine

## 2021-03-31 DIAGNOSIS — I890 Lymphedema, not elsewhere classified: Secondary | ICD-10-CM | POA: Diagnosis not present

## 2021-03-31 DIAGNOSIS — I5042 Chronic combined systolic (congestive) and diastolic (congestive) heart failure: Secondary | ICD-10-CM | POA: Insufficient documentation

## 2021-03-31 DIAGNOSIS — L97822 Non-pressure chronic ulcer of other part of left lower leg with fat layer exposed: Secondary | ICD-10-CM | POA: Insufficient documentation

## 2021-03-31 DIAGNOSIS — N183 Chronic kidney disease, stage 3 unspecified: Secondary | ICD-10-CM | POA: Diagnosis not present

## 2021-03-31 DIAGNOSIS — E11622 Type 2 diabetes mellitus with other skin ulcer: Secondary | ICD-10-CM | POA: Diagnosis not present

## 2021-03-31 DIAGNOSIS — I130 Hypertensive heart and chronic kidney disease with heart failure and stage 1 through stage 4 chronic kidney disease, or unspecified chronic kidney disease: Secondary | ICD-10-CM | POA: Insufficient documentation

## 2021-03-31 DIAGNOSIS — Z9884 Bariatric surgery status: Secondary | ICD-10-CM | POA: Diagnosis not present

## 2021-03-31 DIAGNOSIS — I87313 Chronic venous hypertension (idiopathic) with ulcer of bilateral lower extremity: Secondary | ICD-10-CM | POA: Diagnosis not present

## 2021-03-31 DIAGNOSIS — S81802A Unspecified open wound, left lower leg, initial encounter: Secondary | ICD-10-CM | POA: Diagnosis not present

## 2021-03-31 DIAGNOSIS — I872 Venous insufficiency (chronic) (peripheral): Secondary | ICD-10-CM | POA: Diagnosis not present

## 2021-03-31 NOTE — Progress Notes (Signed)
SIOBAHN, WORSLEY ([id number]) Visit Report for 03/31/2021 Allergy List Details Patient Name: [name], [name] Date of Service: 03/31/2021 8:45 AM Medical Record Number: [id number] Patient Account Number: [id number] Date of Birth/Sex: 11-Feb-1966 (56 y.o. F) Treating RN: Carlene Coria Primary Care Lucky Trotta: Iona Beard Other Clinician: Referring Geniva Lohnes: Referral, Self Treating Shandrea Lusk/Extender: Yaakov Guthrie in Treatment: 0 Allergies Active Allergies No Known Allergies Allergy Notes Electronic Signature(s) Signed: 03/31/2021 1:16:38 PM By: Carlene Coria RN Entered By: Carlene Coria on 03/31/2021 09:00:43 Dicaprio, [name] ([id number]) -------------------------------------------------------------------------------- Arrival Information Details Patient Name: [name] Date of Service: 03/31/2021 8:45 AM Medical Record Number: [id number] Patient Account Number: [id number] Date of Birth/Sex: December 05, 1965 (55 y.o. F) Treating RN: Carlene Coria Primary Care Neale Marzette: Iona Beard Other Clinician: Referring Lynnetta Tom: Referral, Self Treating Torrin Crihfield/Extender: Yaakov Guthrie in Treatment: 0 Visit Information Patient Arrived: Ambulatory Arrival Time: 08:58 Accompanied By: self Transfer Assistance: None Patient Identification Verified: Yes Secondary Verification Process Completed: Yes Patient Requires Transmission-Based No Precautions: Patient Has Alerts: Yes Patient Alerts: DIABETIC R ABI 1.39/.80 03/10/21 L ABI 1.29/.74 03/10/21 History Since Last Visit All ordered tests and consults were completed: No Added or deleted any medications: No Any new allergies or adverse reactions: No Had a fall or experienced change in activities of daily living that may affect risk of falls: No Signs or symptoms of abuse/neglect since last visito No Hospitalized since last visit: No Implantable device outside of the clinic excluding cellular tissue based products  placed in the center since last visit: No Has Dressing in Place as Prescribed: Yes Has Compression in Place as Prescribed: Yes Electronic Signature(s) Signed: 03/31/2021 1:16:38 PM By: Carlene Coria RN Entered By: Carlene Coria on 03/31/2021 [place], [name] ([id number]) -------------------------------------------------------------------------------- Clinic Level of Care Assessment Details Patient Name: [name] Date of Service: 03/31/2021 8:45 AM Medical Record Number: [id number] Patient Account Number: [id number] Date of Birth/Sex: 1966/01/08 (56 y.o. F) Treating RN: Carlene Coria Primary Care Taylore Hinde: Iona Beard Other Clinician: Referring Sanford Lindblad: Referral, Self Treating Emilian Stawicki/Extender: Yaakov Guthrie in Treatment: 0 Clinic Level of Care Assessment Items TOOL 1 Quantity Score []  - Use when EandM and Procedure is performed on INITIAL visit 0 ASSESSMENTS - Nursing Assessment / Reassessment []  - General Physical Exam (combine w/ comprehensive assessment (listed just below) when performed on new 0 pt. evals) []  - 0 Comprehensive Assessment (HX, ROS, Risk Assessments, Wounds Hx, etc.) ASSESSMENTS - Wound and Skin Assessment / Reassessment []  - Dermatologic / Skin Assessment (not related to wound area) 0 ASSESSMENTS - Ostomy and/or Continence Assessment and Care []  - Incontinence Assessment and Management 0 []  - 0 Ostomy Care Assessment and Management (repouching, etc.) PROCESS - Coordination of Care []  - Simple Patient / Family Education for ongoing care 0 []  - 0 Complex (extensive) Patient / Family Education for ongoing care []  - 0 Staff obtains Programmer, systems, Records, Test Results / Process Orders []  - 0 Staff telephones HHA, Nursing Homes / Clarify orders / etc []  - 0 Routine Transfer to another Facility (non-emergent condition) []  - 0 Routine Hospital Admission (non-emergent condition) []  - 0 New Admissions / Biomedical engineer /  Ordering NPWT, Apligraf, etc. []  - 0 Emergency Hospital Admission (emergent condition) PROCESS - Special Needs []  - Pediatric / Minor Patient Management 0 []  - 0 Isolation Patient Management []  - 0 Hearing / Language / Visual special needs []  - 0 Assessment of Community assistance (transportation, D/C planning, etc.) []  - 0 Additional assistance / Altered mentation []  -  0 Support Surface(s) Assessment (bed, cushion, seat, etc.) INTERVENTIONS - Miscellaneous []  - External ear exam 0 []  - 0 Patient Transfer (multiple staff / Civil Service fast streamer / Similar devices) []  - 0 Simple Staple / Suture removal (25 or less) []  - 0 Complex Staple / Suture removal (26 or more) []  - 0 Hypo/Hyperglycemic Management (do not check if billed separately) []  - 0 Ankle / Brachial Index (ABI) - do not check if billed separately Has the patient been seen at the hospital within the last three years: Yes Total Score: 0 Level Of Care: ____ [name] ([id number]) Electronic Signature(s) Signed: 03/31/2021 1:16:38 PM By: Carlene Coria RN Entered By: Carlene Coria on 03/31/2021 09:35:45 Bottino, [name] ([id number]) -------------------------------------------------------------------------------- Compression Therapy Details Patient Name: [name] Date of Service: 03/31/2021 8:45 AM Medical Record Number: [id number] Patient Account Number: [id number] Date of Birth/Sex: 09-25-1965 (55 y.o. F) Treating RN: Carlene Coria Primary Care Laurine Kuyper: Iona Beard Other Clinician: Referring Donnisha Besecker: Referral, Self Treating Lynnea Vandervoort/Extender: Yaakov Guthrie in Treatment: 0 Compression Therapy Performed for Wound Assessment: Wound #2 Left,Medial Lower Leg Performed By: Clinician Carlene Coria, RN Compression Type: Four Layer Post Procedure Diagnosis Same as Pre-procedure Electronic Signature(s) Signed: 03/31/2021 1:16:38 PM By: Carlene Coria RN Entered By: Carlene Coria on 03/31/2021  09:36:22 Bergren, [name] ([id number]) -------------------------------------------------------------------------------- Encounter Discharge Information Details Patient Name: [name] Date of Service: 03/31/2021 8:45 AM Medical Record Number: [id number] Patient Account Number: [id number] Date of Birth/Sex: [birth year] (55 y.o. F) Treating RN: Carlene Coria Primary Care Javaughn Opdahl: Iona Beard Other Clinician: Referring Denzel Etienne: Referral, Self Treating Elowen Debruyn/Extender: Yaakov Guthrie in Treatment: 0 Encounter Discharge Information Items Post Procedure Vitals Discharge Condition: Stable Temperature (F): 98.2 Ambulatory Status: Ambulatory Pulse (bpm): 63 Discharge Destination: Home Respiratory Rate (breaths/min): 18 Transportation: Private Auto Blood Pressure (mmHg): 143/72 Accompanied By: self Schedule Follow-up Appointment: Yes Clinical Summary of Care: Patient Declined Electronic Signature(s) Signed: 03/31/2021 1:16:38 PM By: Carlene Coria RN Entered By: Carlene Coria on 03/31/2021 09:37:43 Mingle, [name] ([id number]) -------------------------------------------------------------------------------- Lower Extremity Assessment Details Patient Name: [name] Date of Service: 03/31/2021 8:45 AM Medical Record Number: [id number] Patient Account Number: [id number] Date of Birth/Sex: 1965-11-10 (55 y.o. F) Treating RN: Carlene Coria Primary Care Adeeb Konecny: Iona Beard Other Clinician: Referring Shayde Gervacio: Referral, Self Treating Hartlee Amedee/Extender: Yaakov Guthrie in Treatment: 0 Edema Assessment Assessed: [Left: No] [Right: No] Edema: [Left: Ye] [Right: s] Calf Left: Right: Point of Measurement: 30 cm From Medial Instep 51 cm Ankle Left: Right: Point of Measurement: 10 cm From Medial Instep 32 cm Knee To Floor Left: Right: From Medial Instep 44 cm Vascular Assessment Pulses: Dorsalis Pedis Palpable: [Left:Yes] Electronic  Signature(s) Signed: 03/31/2021 1:16:38 PM By: Carlene Coria RN Entered By: Carlene Coria on 03/31/2021 09:11:48 Tuite, [name] ([id number]) -------------------------------------------------------------------------------- Multi Wound Chart Details Patient Name: [name] Date of Service: 03/31/2021 8:45 AM Medical Record Number: [id number] Patient Account Number: [id number] Date of Birth/Sex: 02-08-1966 (55 y.o. F) Treating RN: Carlene Coria Primary Care Jayvier Burgher: Iona Beard Other Clinician: Referring Rohin Krejci: Referral, Self Treating Konstance Happel/Extender: Yaakov Guthrie in Treatment: 0 Vital Signs Height(in): 60 Pulse(bpm): 56 Weight(lbs): 270 Blood Pressure(mmHg): 143/72 Body Mass Index(BMI): 51.4 Temperature(F): 98.2 Respiratory Rate(breaths/min): 18 Photos: [N/A:N/A] Wound Location: Left, Medial Lower Leg N/A N/A Wounding Event: Gradually Appeared N/A N/A Primary Etiology: Diabetic Wound/Ulcer of the Lower N/A N/A Extremity Comorbid History: Cataracts, Lymphedema, Asthma, N/A N/A Congestive Heart Failure, Hypertension, Type II Diabetes, Osteoarthritis, Neuropathy Date Acquired: 03/18/2021 N/A N/A [place]ella [place]  of Treatment: 0 N/A N/A Wound Status: Open N/A N/A Wound Recurrence: No N/A N/A Measurements L x W x D (cm) 1.3x2.5x0.1 N/A N/A Area (cm) : 2.553 N/A N/A Volume (cm) : 0.255 N/A N/A Classification: Grade 2 N/A N/A Exudate Amount: Medium N/A N/A Exudate Type: Serosanguineous N/A N/A Exudate Color: red, brown N/A N/A Granulation Amount: Large (67-100%) N/A N/A Granulation Quality: Red N/A N/A Necrotic Amount: None Present (0%) N/A N/A Exposed Structures: Fat Layer (Subcutaneous Tissue): N/A N/A Yes Fascia: No Tendon: No Muscle: No Joint: No Bone: No Epithelialization: None N/A N/A Debridement: Chemical/Enzymatic/Mechanical N/A N/A Pre-procedure Verification/Time 09:30 N/A N/A Out Taken: Instrument: Other(saline and gauze) N/A  N/A Bleeding: Minimum N/A N/A Hemostasis Achieved: Pressure N/A N/A Procedural Pain: 0 N/A N/A Post Procedural Pain: 0 N/A N/A Debridement Treatment Procedure was tolerated well N/A N/A Response: Post Debridement 1.3x2.5x0.1 N/A N/A Measurements L x W x D (cm) 0.255 N/A N/A Manalo, Sally-Ann R. ([id number]) Post Debridement Volume: (cm) Procedures Performed: Compression Therapy N/A N/A Debridement Treatment Notes Wound #2 (Lower Leg) Wound Laterality: Left, Medial Cleanser Peri-Wound Care Topical Primary Dressing Silvercel Small 2x2 (in/in) Discharge Instruction: Apply Silvercel Small 2x2 (in/in) as instructed Secondary Dressing ABD Pad 5x9 (in/in) Discharge Instruction: Cover with ABD pad Secured With Compression Wrap Medichoice 4 layer Compression System, 35-40 mmHG Discharge Instruction: Apply multi-layer wrap as directed. Compression Stockings Circaid Juxta Lite Compression Wrap Quantity: 1 Left Leg Compression Amount: 30-40 mmHg Discharge Instruction: Apply Circaid Juxta Lite Compression Wrap as directed Add-Ons Electronic Signature(s) Signed: 03/31/2021 10:44:58 AM By: Kalman Shan DO Entered By: Kalman Shan on 03/31/2021 10:38:46 Chovanec, [name] ([id number]) -------------------------------------------------------------------------------- Multi-Disciplinary Care Plan Details Patient Name: [name] Date of Service: 03/31/2021 8:45 AM Medical Record Number: [id number] Patient Account Number: [id number] Date of Birth/Sex: 1965/10/26 (56 y.o. F) Treating RN: Carlene Coria Primary Care Yao Hyppolite: Iona Beard Other Clinician: Referring Susi Goslin: Referral, Self Treating Marthena Whitmyer/Extender: Yaakov Guthrie in Treatment: 0 Active Inactive Electronic Signature(s) Signed: 03/31/2021 1:16:38 PM By: Carlene Coria RN Entered By: Carlene Coria on 03/31/2021 09:26:45 Khalid, [name]  ([id number]) -------------------------------------------------------------------------------- Pain Assessment Details Patient Name: [name] Date of Service: 03/31/2021 8:45 AM Medical Record Number: [id number] Patient Account Number: [id number] Date of Birth/Sex: 1965-08-20 (56 y.o. F) Treating RN: Carlene Coria Primary Care Willella Harding: Iona Beard Other Clinician: Referring Harumi Yamin: Referral, Self Treating Trust Crago/Extender: Yaakov Guthrie in Treatment: 0 Active Problems Location of Pain Severity and Description of Pain Patient Has Paino No Site Locations Pain Management and Medication Current Pain Management: Electronic Signature(s) Signed: 03/31/2021 1:16:38 PM By: Carlene Coria RN Entered By: Carlene Coria on 03/31/2021 08:59:30 Ikard, [name] ([id number]) -------------------------------------------------------------------------------- Patient/Caregiver Education Details Patient Name: [name] Date of Service: 03/31/2021 8:45 AM Medical Record Number: [id number] Patient Account Number: [id number] Date of Birth/Gender: May 01, 1965 (56 y.o. F) Treating RN: Carlene Coria Primary Care Physician: Iona Beard Other Clinician: Referring Physician: Referral, Self Treating Physician/Extender: Yaakov Guthrie in Treatment: 0 Education Assessment Education Provided To: Patient Education Topics Provided Wound/Skin Impairment: Methods: Explain/Verbal Responses: State content correctly Electronic Signature(s) Signed: 03/31/2021 1:16:38 PM By: Carlene Coria RN Entered By: Carlene Coria on 03/31/2021 09:36:51 Bia, [name] ([id number]) -------------------------------------------------------------------------------- Wound Assessment Details Patient Name: [name] Date of Service: 03/31/2021 8:45 AM Medical Record Number: [id number] Patient Account Number: [id number] Date of Birth/Sex: 1966-02-09 (55 y.o. F) Treating RN: Carlene Coria Primary Care Salif Tay: Iona Beard Other Clinician: Referring Germain Koopmann: Referral, Self Treating Vannie Hilgert/Extender: Yaakov Guthrie in  Treatment: 0 Wound Status Wound Number: 2 Primary Diabetic Wound/Ulcer of the Lower Extremity Etiology: Wound Location: Left, Medial Lower Leg Wound Open Wounding Event: Gradually Appeared Status: Date Acquired: 03/18/2021 Comorbid Cataracts, Lymphedema, Asthma, Congestive Heart Weeks Of Treatment: 0 History: Failure, Hypertension, Type II Diabetes, Osteoarthritis, Clustered Wound: No Neuropathy Photos Wound Measurements Length: (cm) 1.3 % Reduct Width: (cm) 2.5 % Reduct Depth: (cm) 0.1 Epitheli Area: (cm) 2.553 Tunneli Volume: (cm) 0.255 Undermi ion in Area: ion in Volume: alization: None ng: No ning: No Wound Description Classification: Grade 2 Foul Odo Exudate Amount: Medium Slough/F Exudate Type: Serosanguineous Exudate Color: red, brown r After Cleansing: No ibrino No Wound Bed Granulation Amount: Large (67-100%) Exposed Structure Granulation Quality: Red Fascia Exposed: No Necrotic Amount: None Present (0%) Fat Layer (Subcutaneous Tissue) Exposed: Yes Tendon Exposed: No Muscle Exposed: No Joint Exposed: No Bone Exposed: No Treatment Notes Wound #2 (Lower Leg) Wound Laterality: Left, Medial Cleanser Peri-Wound Care Topical Primary Dressing Sarris, Navada R. ([id number]) Silvercel Small 2x2 (in/in) Discharge Instruction: Apply Silvercel Small 2x2 (in/in) as instructed Secondary Dressing ABD Pad 5x9 (in/in) Discharge Instruction: Cover with ABD pad Secured With Compression Wrap Medichoice 4 layer Compression System, 35-40 mmHG Discharge Instruction: Apply multi-layer wrap as directed. Compression Stockings Circaid Juxta Lite Compression Wrap Quantity: 1 Left Leg Compression Amount: 30-40 mmHg Discharge Instruction: Apply Circaid Juxta Lite Compression Wrap as directed Add-Ons Electronic  Signature(s) Signed: 03/31/2021 1:16:38 PM By: Carlene Coria RN Entered By: Carlene Coria on 03/31/2021 09:11:02 Stephen, [name] ([id number]) -------------------------------------------------------------------------------- [place] Details Patient Name: [name] Date of Service: 03/31/2021 8:45 AM Medical Record Number: [id number] Patient Account Number: [id number] Date of Birth/Sex: [birth year] (56 y.o. F) Treating RN: Carlene Coria Primary Care Mila Pair: Iona Beard Other Clinician: Referring Dejuan Elman: Referral, Self Treating Yer Olivencia/Extender: Yaakov Guthrie in Treatment: 0 Vital Signs Time Taken: 08:59 Temperature (F): 98.2 Height (in): 60 Pulse (bpm): 63 Source: Stated Respiratory Rate (breaths/min): 18 Weight (lbs): 263 Blood Pressure (mmHg): 143/72 Source: Stated Reference Range: 80 - 120 mg / dl Body Mass Index (BMI): 51.4 Electronic Signature(s) Signed: 03/31/2021 1:16:38 PM By: Carlene Coria RN Entered By: Carlene Coria on 03/31/2021 09:00:29

## 2021-03-31 NOTE — Progress Notes (Signed)
MARYELA, TAPPER ([id number]) Visit Report for 03/31/2021 Abuse Risk Screen Details Patient Name: JULY, LINAM Date of Service: 03/31/2021 8:45 AM Medical Record Number: [id number] Patient Account Number: [id number] Date of Birth/Sex: Oct 08, 1965 (56 y.o. F) Treating RN: Carlene Coria Primary Care Shalen Petrak: Iona Beard Other Clinician: Referring Maven Rosander: Referral, Self Treating Tyrome Donatelli/Extender: Yaakov Guthrie in Treatment: 0 Abuse Risk Screen Items Answer ABUSE RISK SCREEN: Has anyone close to you tried to hurt or harm you recentlyo No Do you feel uncomfortable with anyone in your familyo No Has anyone forced you do things that you didnot want to doo No Electronic Signature(s) Signed: 03/31/2021 1:16:38 PM By: Carlene Coria RN Entered By: Carlene Coria on 03/31/2021 09:02:05 Vanzandt, [name] ([id number]) -------------------------------------------------------------------------------- Activities of Daily Living Details Patient Name: [name] Date of Service: 03/31/2021 8:45 AM Medical Record Number: [id number] Patient Account Number: [id number] Date of Birth/Sex: 06/18/1965 (56 y.o. F) Treating RN: Carlene Coria Primary Care Marvella Jenning: Iona Beard Other Clinician: Referring Zyliah Schier: Referral, Self Treating Adylee Leonardo/Extender: Yaakov Guthrie in Treatment: 0 Activities of Daily Living Items Answer Activities of Daily Living (Please select one for each item) Drive Automobile Completely Able Take Medications Completely Able Use Telephone Completely Able Care for Appearance Completely Able Use Toilet Completely Able Bath / Shower Completely Able Dress Self Completely Able Feed Self Completely Able Walk Completely Able Get In / Out Bed Completely Able Housework Completely Able Prepare Meals Completely Able Handle Money Completely Able Shop for Self Completely Able Electronic Signature(s) Signed: 03/31/2021 1:16:38 PM By: Carlene Coria  RN Entered By: Carlene Coria on 03/31/2021 09:02:36 Defelice, [name] ([id number]) -------------------------------------------------------------------------------- Education Screening Details Patient Name: [name] Date of Service: 03/31/2021 8:45 AM Medical Record Number: [id number] Patient Account Number: [id number] Date of Birth/Sex: Dec 29, 1965 (55 y.o. F) Treating RN: Carlene Coria Primary Care Freida Nebel: Iona Beard Other Clinician: Referring Yarel Rushlow: Referral, Self Treating Thinh Cuccaro/Extender: Yaakov Guthrie in Treatment: 0 Primary Learner Assessed: Patient Learning Preferences/Education Level/Primary Language Learning Preference: Explanation Highest Education Level: High School Preferred Language: English Cognitive Barrier Language Barrier: No Translator Needed: No Memory Deficit: No Emotional Barrier: No Cultural/Religious Beliefs Affecting Medical Care: No Physical Barrier Impaired Vision: No Impaired Hearing: No Decreased Hand dexterity: No Knowledge/Comprehension Knowledge Level: Medium Comprehension Level: High Ability to understand written instructions: High Ability to understand verbal instructions: High Motivation Anxiety Level: Anxious Cooperation: Cooperative Education Importance: Acknowledges Need Interest in Health Problems: Asks Questions Perception: Coherent Willingness to Engage in Self-Management High Activities: Readiness to Engage in Self-Management High Activities: Electronic Signature(s) Signed: 03/31/2021 1:16:38 PM By: Carlene Coria RN Entered By: Carlene Coria on 03/31/2021 09:03:16 Pohlmann, [name] ([id number]) -------------------------------------------------------------------------------- Fall Risk Assessment Details Patient Name: [name] Date of Service: 03/31/2021 8:45 AM Medical Record Number: [id number] Patient Account Number: [id number] Date of Birth/Sex: 03/23/1965 (55 y.o. F) Treating RN:  Carlene Coria Primary Care Miriam Liles: Iona Beard Other Clinician: Referring Mercer Peifer: Referral, Self Treating Gelisa Tieken/Extender: Yaakov Guthrie in Treatment: 0 Fall Risk Assessment Items Have you had 2 or more falls in the last 12 monthso 0 No Have you had any fall that resulted in injury in the last 12 monthso 0 No FALLS RISK SCREEN History of falling - immediate or within 3 months 0 No Secondary diagnosis (Do you have 2 or more medical diagnoseso) 0 No Ambulatory aid None/bed rest/wheelchair/nurse 0 No Crutches/cane/walker 0 No Furniture 0 No Intravenous therapy Access/Saline/Heparin Lock 0 No Gait/Transferring Normal/ bed rest/ wheelchair 0 No Weak (short steps with or  without shuffle, stooped but able to lift head while walking, may 0 No seek support from furniture) Impaired (short steps with shuffle, may have difficulty arising from chair, head down, impaired 0 No balance) Mental Status Oriented to own ability 0 No Electronic Signature(s) Signed: 03/31/2021 1:16:38 PM By: Carlene Coria RN Entered By: Carlene Coria on 03/31/2021 09:03:32 Kassebaum, [name] ([id number]) -------------------------------------------------------------------------------- Foot Assessment Details Patient Name: [name] Date of Service: 03/31/2021 8:45 AM Medical Record Number: [id number] Patient Account Number: [id number] Date of Birth/Sex: 07-02-1965 (55 y.o. F) Treating RN: Carlene Coria Primary Care Kyera Felan: Iona Beard Other Clinician: Referring Kaizer Dissinger: Referral, Self Treating Rosemarie Galvis/Extender: Yaakov Guthrie in Treatment: 0 Foot Assessment Items Site Locations + = Sensation present, - = Sensation absent, C = Callus, U = Ulcer R = Redness, W = Warmth, M = Maceration, PU = Pre-ulcerative lesion F = Fissure, S = Swelling, D = Dryness Assessment Right: Left: Other Deformity: No No Prior Foot Ulcer: No No Prior Amputation: No No Charcot Joint: No  No Ambulatory Status: Ambulatory Without Help Gait: Steady Electronic Signature(s) Signed: 03/31/2021 1:16:38 PM By: Carlene Coria RN Entered By: Carlene Coria on 03/31/2021 09:05:38 Degroff, [name] ([id number]) -------------------------------------------------------------------------------- Nutrition Risk Screening Details Patient Name: [name] Date of Service: 03/31/2021 8:45 AM Medical Record Number: [id number] Patient Account Number: [id number] Date of Birth/Sex: 01-02-1966 (55 y.o. F) Treating RN: Carlene Coria Primary Care Coner Gibbard: Iona Beard Other Clinician: Referring Darral Rishel: Referral, Self Treating Joani Cosma/Extender: Yaakov Guthrie in Treatment: 0 Height (in): 60 Weight (lbs): 263 Body Mass Index (BMI): 51.4 Nutrition Risk Screening Items Score Screening NUTRITION RISK SCREEN: I have an illness or condition that made me change the kind and/or amount of food I eat 0 No I eat fewer than two meals per day 0 No I eat few fruits and vegetables, or milk products 0 No I have three or more drinks of beer, liquor or wine almost every day 0 No I have tooth or mouth problems that make it hard for me to eat 0 No I don't always have enough money to buy the food I need 0 No I eat alone most of the time 0 No I take three or more different prescribed or over-the-counter drugs a day 1 Yes Without wanting to, I have lost or gained 10 pounds in the last six months 0 No I am not always physically able to shop, cook and/or feed myself 0 No Nutrition Protocols Good Risk Protocol Moderate Risk Protocol High Risk Proctocol Risk Level: Good Risk Score: 1 Electronic Signature(s) Signed: 03/31/2021 1:16:38 PM By: Carlene Coria RN Entered By: Carlene Coria on 03/31/2021 09:03:51

## 2021-03-31 NOTE — Progress Notes (Signed)
ADJA, RUFF ([id number]) Visit Report for 03/31/2021 Chief Complaint Document Details Patient Name: [name], [name] Date of Service: 03/31/2021 8:45 AM Medical Record Number: [id number] Patient Account Number: [id number] Date of Birth/Sex: Oct 06, 1965 (56 y.o. F) Treating RN: Carlene Coria Primary Care Provider: Iona Beard Other Clinician: Referring Provider: Referral, Self Treating Provider/Extender: Yaakov Guthrie in Treatment: 0 Information Obtained from: Patient Chief Complaint Evaluation for lymphedema pumps Electronic Signature(s) Signed: 03/31/2021 10:44:58 AM By: Kalman Shan DO Entered By: Kalman Shan on 03/31/2021 10:38:59 [name], [name] ([id number]) -------------------------------------------------------------------------------- Debridement Details Patient Name: [name] Date of Service: 03/31/2021 8:45 AM Medical Record Number: [id number] Patient Account Number: [id number] Date of Birth/Sex: 1965-09-02 (55 y.o. F) Treating RN: Carlene Coria Primary Care Provider: Iona Beard Other Clinician: Referring Provider: Referral, Self Treating Provider/Extender: Yaakov Guthrie in Treatment: 0 Debridement Performed for Wound #2 Left,Medial Lower Leg Assessment: Performed By: Physician Kalman Shan, MD Debridement Type: Chemical/Enzymatic/Mechanical Agent Used: Saline and gauze Severity of Tissue Pre Debridement: Fat layer exposed Level of Consciousness (Pre- Awake and Alert procedure): Pre-procedure Verification/Time Out Yes - 09:30 Taken: Start Time: 09:30 Instrument: Other : saline and gauze Bleeding: Minimum Hemostasis Achieved: Pressure End Time: 09:34 Procedural Pain: 0 Post Procedural Pain: 0 Response to Treatment: Procedure was tolerated well Level of Consciousness (Post- Awake and Alert procedure): Post Debridement Measurements of Total Wound Length: (cm) 1.3 Width: (cm) 2.5 Depth: (cm)  0.1 Volume: (cm) 0.255 Character of Wound/Ulcer Post Debridement: Improved Severity of Tissue Post Debridement: Fat layer exposed Post Procedure Diagnosis Same as Pre-procedure Electronic Signature(s) Signed: 03/31/2021 10:44:58 AM By: Kalman Shan DO Signed: 03/31/2021 1:16:38 PM By: Carlene Coria RN Entered By: Carlene Coria on 03/31/2021 09:35:17 [name], [name] ([id number]) -------------------------------------------------------------------------------- HPI Details Patient Name: [name] Date of Service: 03/31/2021 8:45 AM Medical Record Number: [id number] Patient Account Number: [id number] Date of Birth/Sex: 1965/07/27 (55 y.o. F) Treating RN: Carlene Coria Primary Care Provider: Iona Beard Other Clinician: Referring Provider: Referral, Self Treating Provider/Extender: Yaakov Guthrie in Treatment: 0 History of Present Illness HPI Description: 10/20/2020 upon evaluation today patient appears to be doing somewhat poorly in regard to her left leg where she has a significant wound although not very deep but this is definitely due to her chronic swelling. Fortunately there does not appear to be any signs of active infection at this time which is great news. There was a lot of scabbing going on and then she had areas that were somewhat open up underneath. It does appear to be very lymphedema like in nature. With that being said I do not see any signs of active infection currently. I do think that based on what we are seeing at the moment this is likely an area that will respond well to appropriate compression therapy. If it does not then we will need to reevaluate the situation for sure. The patient does have a history of diabetes mellitus type 2, chronic venous insufficiency, lymphedema, chronic kidney disease stage III, and congestive heart failure. She is status post bariatric surgery as well. Overall I am pleased with where things stand I think she does need  to definitely have compression in place. This wound has been present for about 4 to 6 weeks she tells me. 10/29/2020 upon evaluation today patient actually appears to be doing much better. She has been tolerating the dressing changes without complication and the compression wrap did slide down her a bit but to be perfectly honest I am very pleased overall  with well the wound appears today. I do not see any signs of active infection which is also great news. Nonetheless I think that we are definitely headed in the appropriate direction as far as healing is concerned and in fact this is significantly improved even as compared to just last week. 11/06/2020 upon evaluation today patient appears to be doing well with regard to her wounds. In fact everything appears to be completely healed which is great news. I am extremely pleased with where things stand today. No fevers, chills, nausea, vomiting, or diarrhea. 02/17/2021 Ms. Alvenia Treese is a 56 year old female with a past medical history of insulin-dependent type 2 diabetes, CKD stage III, bariatric surgery and lymphedema that presents to the clinic because she would like to order lymphedema pumps. She was seen in our clinic in August and September for an open wound to her left lower extremity due to lymphedema. She was treated With 4-layer compression for 2-1/2 weeks with resolution of her wound. She has not developed wounds since her discharge in September. She has compression stockings but states she cannot put these on daily. Readmission 03/31/2021 Patient presents with an open wound today. She was last seen on 02/17/2021 for lymphedema pump order. She states that she is working with insurance to get these covered. She still has not received them. She also has not been using compression stockings daily. She states these are difficult to put on. She developed a wound 2 weeks ago and has been keeping the area covered. She currently denies signs of  infection. Electronic Signature(s) Signed: 03/31/2021 10:44:58 AM By: Kalman Shan DO Entered By: Kalman Shan on 03/31/2021 10:40:20 [name], [name] ([id number]) -------------------------------------------------------------------------------- Physical Exam Details Patient Name: [name] Date of Service: 03/31/2021 8:45 AM Medical Record Number: [id number] Patient Account Number: [id number] Date of Birth/Sex: February 19, 1966 (55 y.o. F) Treating RN: Carlene Coria Primary Care Provider: Iona Beard Other Clinician: Referring Provider: Referral, Self Treating Provider/Extender: Yaakov Guthrie in Treatment: 0 Constitutional . Cardiovascular . Psychiatric . Notes Left lower extremity: To the distal medial aspect there is an open wound with granulation tissue. 2+ pitting edema to the knee. No signs of infection. Electronic Signature(s) Signed: 03/31/2021 10:44:58 AM By: Kalman Shan DO Entered By: Kalman Shan on 03/31/2021 10:42:05 [name], [name] ([id number]) -------------------------------------------------------------------------------- Physician Orders Details Patient Name: [name] Date of Service: 03/31/2021 8:45 AM Medical Record Number: [id number] Patient Account Number: [id number] Date of Birth/Sex: Apr 05, 1965 (55 y.o. F) Treating RN: Carlene Coria Primary Care Provider: Iona Beard Other Clinician: Referring Provider: Referral, Self Treating Provider/Extender: Yaakov Guthrie in Treatment: 0 Verbal / Phone Orders: No Diagnosis Coding Discharge From [facility] Services o Moisturize legs daily after removing compression garments. o Elevate, Exercise Daily and Avoid Standing for Long Periods of Time. o DO YOUR BEST to sleep in the bed at night. DO NOT sleep in your recliner. Long hours of sitting in a recliner leads to swelling of the legs and/or potential wounds on your backside. Bathing/ Shower/ Hygiene o May  shower with wound dressing protected with water repellent cover or cast protector. o No tub bath. Edema Control - Lymphedema / Segmental Compressive Device / Other o Optional: One layer of unna paste to top of compression wrap (to act as an anchor). o Compression Pump: Use compression pump on left lower extremity for 60 minutes, twice daily. o Compression Pump: Use compression pump on right lower extremity for 60 minutes, twice daily. o DO YOUR BEST to sleep in  the bed at night. DO NOT sleep in your recliner. Long hours of sitting in a recliner leads to swelling of the legs and/or potential wounds on your backside. Wound Treatment Wound #2 - Lower Leg Wound Laterality: Left, Medial Primary Dressing: Silvercel Small 2x2 (in/in) 1 x Per Week/30 Days Discharge Instructions: Apply Silvercel Small 2x2 (in/in) as instructed Secondary Dressing: ABD Pad 5x9 (in/in) 1 x Per Week/30 Days Discharge Instructions: Cover with ABD pad Compression Wrap: Medichoice 4 layer Compression System, 35-40 mmHG 1 x Per Week/30 Days Discharge Instructions: Apply multi-layer wrap as directed. Compression Stockings: Circaid Juxta Lite Compression Wrap (DME) Left Leg Compression Amount: 30-40 mmHG Discharge Instructions: Apply Circaid Juxta Lite Compression Wrap as directed Electronic Signature(s) Signed: 03/31/2021 10:44:58 AM By: Kalman Shan DO Signed: 03/31/2021 1:16:38 PM By: Carlene Coria RN Entered By: Carlene Coria on 03/31/2021 09:35:39 [name], [name] ([id number]) -------------------------------------------------------------------------------- Problem List Details Patient Name: [name] Date of Service: 03/31/2021 8:45 AM Medical Record Number: [id number] Patient Account Number: [id number] Date of Birth/Sex: October 21, 1965 (56 y.o. F) Treating RN: Carlene Coria Primary Care Provider: Iona Beard Other Clinician: Referring Provider: Referral, Self Treating Provider/Extender: Yaakov Guthrie in Treatment: 0 Active Problems ICD-10 Encounter Code Description Active Date MDM Diagnosis L97.822 Non-pressure chronic ulcer of other part of left lower leg with fat layer 03/31/2021 No Yes exposed I89.0 Lymphedema, not elsewhere classified 03/31/2021 No Yes I87.2 Venous insufficiency (chronic) (peripheral) 03/31/2021 No Yes E11.622 Type 2 diabetes mellitus with other skin ulcer 03/31/2021 No Yes N18.30 Chronic kidney disease, stage 3 unspecified 03/31/2021 No Yes I50.42 Chronic combined systolic (congestive) and diastolic (congestive) heart 03/31/2021 No Yes failure Z98.84 Bariatric surgery status 03/31/2021 No Yes Inactive Problems Resolved Problems Electronic Signature(s) Signed: 03/31/2021 10:44:58 AM By: Kalman Shan DO Entered By: Kalman Shan on 03/31/2021 10:38:37 [name], [name] ([id number]) -------------------------------------------------------------------------------- Progress Note Details Patient Name: [name] Date of Service: 03/31/2021 8:45 AM Medical Record Number: [id number] Patient Account Number: [id number] Date of Birth/Sex: January 19, 1966 (56 y.o. F) Treating RN: Carlene Coria Primary Care Provider: Iona Beard Other Clinician: Referring Provider: Referral, Self Treating Provider/Extender: Yaakov Guthrie in Treatment: 0 Subjective Chief Complaint Information obtained from Patient Evaluation for lymphedema pumps History of Present Illness (HPI) 10/20/2020 upon evaluation today patient appears to be doing somewhat poorly in regard to her left leg where she has a significant wound although not very deep but this is definitely due to her chronic swelling. Fortunately there does not appear to be any signs of active infection at this time which is great news. There was a lot of scabbing going on and then she had areas that were somewhat open up underneath. It does appear to be very lymphedema like in nature. With that being said I do  not see any signs of active infection currently. I do think that based on what we are seeing at the moment this is likely an area that will respond well to appropriate compression therapy. If it does not then we will need to reevaluate the situation for sure. The patient does have a history of diabetes mellitus type 2, chronic venous insufficiency, lymphedema, chronic kidney disease stage III, and congestive heart failure. She is status post bariatric surgery as well. Overall I am pleased with where things stand I think she does need to definitely have compression in place. This wound has been present for about 4 to 6 weeks she tells me. 10/29/2020 upon evaluation today patient actually appears to be doing much  better. She has been tolerating the dressing changes without complication and the compression wrap did slide down her a bit but to be perfectly honest I am very pleased overall with well the wound appears today. I do not see any signs of active infection which is also great news. Nonetheless I think that we are definitely headed in the appropriate direction as far as healing is concerned and in fact this is significantly improved even as compared to just last week. 11/06/2020 upon evaluation today patient appears to be doing well with regard to her wounds. In fact everything appears to be completely healed which is great news. I am extremely pleased with where things stand today. No fevers, chills, nausea, vomiting, or diarrhea. 02/17/2021 Ms. [name] is a 56 year old female with a past medical history of insulin-dependent type 2 diabetes, CKD stage III, bariatric surgery and lymphedema that presents to the clinic because she would like to order lymphedema pumps. She was seen in our clinic in August and September for an open wound to her left lower extremity due to lymphedema. She was treated With 4-layer compression for 2-1/2 weeks with resolution of her wound. She has not developed  wounds since her discharge in September. She has compression stockings but states she cannot put these on daily. Readmission 03/31/2021 Patient presents with an open wound today. She was last seen on 02/17/2021 for lymphedema pump order. She states that she is working with insurance to get these covered. She still has not received them. She also has not been using compression stockings daily. She states these are difficult to put on. She developed a wound 2 weeks ago and has been keeping the area covered. She currently denies signs of infection. Patient History Information obtained from Patient. Allergies No Known Allergies Social History Never smoker, Marital Status - Single, Alcohol Use - Rarely, Drug Use - No History, Caffeine Use - Rarely. Medical History Eyes Patient has history of Cataracts - hx Hematologic/Lymphatic Patient has history of Lymphedema Respiratory Patient has history of Asthma Cardiovascular Patient has history of Congestive Heart Failure, Hypertension Endocrine Patient has history of Type II Diabetes Musculoskeletal Patient has history of Osteoarthritis Neurologic Patient has history of Neuropathy [name], Lori R. ([id number]) Objective Constitutional Vitals Time Taken: 8:59 AM, Height: 60 in, Source: Stated, Weight: 263 lbs, Source: Stated, BMI: 51.4, Temperature: 98.2 F, Pulse: 63 bpm, Respiratory Rate: 18 breaths/min, Blood Pressure: 143/72 mmHg. General Notes: Left lower extremity: To the distal medial aspect there is an open wound with granulation tissue. 2+ pitting edema to the knee. No signs of infection. Integumentary (Hair, Skin) Wound #2 status is Open. Original cause of wound was Gradually Appeared. The date acquired was: 03/18/2021. The wound is located on the Left,Medial Lower Leg. The wound measures 1.3cm length x 2.5cm width x 0.1cm depth; 2.553cm^2 area and 0.255cm^3 volume. There is Fat Layer (Subcutaneous Tissue) exposed. There is no  tunneling or undermining noted. There is a medium amount of serosanguineous drainage noted. There is large (67-100%) red granulation within the wound bed. There is no necrotic tissue within the wound bed. Assessment Active Problems ICD-10 Non-pressure chronic ulcer of other part of left lower leg with fat layer exposed Lymphedema, not elsewhere classified Venous insufficiency (chronic) (peripheral) Type 2 diabetes mellitus with other skin ulcer Chronic kidney disease, stage 3 unspecified Chronic combined systolic (congestive) and diastolic (congestive) heart failure Bariatric surgery status Patient presents with a 2-week history of nonhealing wound to the left lower extremity. Patient has a  history of chronic venous insufficiency with lymphedema. Patient has done well in the past with Silvercel and 4-layer compression. We will do this today in office. She knows not to get the compression wrap wet or keep this on for more than a week. We will order her a juxta light compression since she is having difficulty putting on daily compression stockings. Procedures Wound #2 Pre-procedure diagnosis of Wound #2 is a Diabetic Wound/Ulcer of the Lower Extremity located on the Left,Medial Lower Leg .Severity of Tissue Pre Debridement is: Fat layer exposed. There was a Chemical/Enzymatic/Mechanical debridement performed by Kalman Shan, MD. With the following instrument(s): saline and gauze. Other agent used was Saline and gauze. A time out was conducted at 09:30, prior to the start of the procedure. A Minimum amount of bleeding was controlled with Pressure. The procedure was tolerated well with a pain level of 0 throughout and a pain level of 0 following the procedure. Post Debridement Measurements: 1.3cm length x 2.5cm width x 0.1cm depth; 0.255cm^3 volume. Character of Wound/Ulcer Post Debridement is improved. Severity of Tissue Post Debridement is: Fat layer exposed. Post procedure Diagnosis Wound  #2: Same as Pre-Procedure Pre-procedure diagnosis of Wound #2 is a Diabetic Wound/Ulcer of the Lower Extremity located on the Left,Medial Lower Leg . There was a Four Layer Compression Therapy Procedure by Carlene Coria, RN. Post procedure Diagnosis Wound #2: Same as Pre-Procedure Plan [name], [name] ([id number]) Discharge From [facility] Services: Moisturize legs daily after removing compression garments. Elevate, Exercise Daily and Avoid Standing for Long Periods of Time. DO YOUR BEST to sleep in the bed at night. DO NOT sleep in your recliner. Long hours of sitting in a recliner leads to swelling of the legs and/or potential wounds on your backside. Bathing/ Shower/ Hygiene: May shower with wound dressing protected with water repellent cover or cast protector. No tub bath. Edema Control - Lymphedema / Segmental Compressive Device / Other: Optional: One layer of unna paste to top of compression wrap (to act as an anchor). Compression Pump: Use compression pump on left lower extremity for 60 minutes, twice daily. Compression Pump: Use compression pump on right lower extremity for 60 minutes, twice daily. DO YOUR BEST to sleep in the bed at night. DO NOT sleep in your recliner. Long hours of sitting in a recliner leads to swelling of the legs and/or potential wounds on your backside. WOUND #2: - Lower Leg Wound Laterality: Left, Medial Primary Dressing: Silvercel Small 2x2 (in/in) 1 x Per Week/30 Days Discharge Instructions: Apply Silvercel Small 2x2 (in/in) as instructed Secondary Dressing: ABD Pad 5x9 (in/in) 1 x Per Week/30 Days Discharge Instructions: Cover with ABD pad Compression Wrap: Medichoice 4 layer Compression System, 35-40 mmHG 1 x Per Week/30 Days Discharge Instructions: Apply multi-layer wrap as directed. Compression Stockings: Circaid Juxta Lite Compression Wrap (DME) Compression Amount: 30-40 mmHg (left) Discharge Instructions: Apply Circaid Juxta Lite Compression Wrap  as directed 1. Silver cell with 4-layer compression 2. Order juxta lite 3. Follow-up in 1 week Electronic Signature(s) Signed: 03/31/2021 10:44:58 AM By: Kalman Shan DO Entered By: Kalman Shan on 03/31/2021 10:44:15 [name], [name] ([id number]) -------------------------------------------------------------------------------- ROS/PFSH Details Patient Name: [name] Date of Service: 03/31/2021 8:45 AM Medical Record Number: [id number] Patient Account Number: [id number] Date of Birth/Sex: 07/06/1965 (55 y.o. F) Treating RN: Carlene Coria Primary Care Provider: Iona Beard Other Clinician: Referring Provider: Referral, Self Treating Provider/Extender: Yaakov Guthrie in Treatment: 0 Information Obtained From Patient Eyes Medical History: Positive for: Cataracts - hx  Hematologic/Lymphatic Medical History: Positive for: Lymphedema Respiratory Medical History: Positive for: Asthma Cardiovascular Medical History: Positive for: Congestive Heart Failure; Hypertension Endocrine Medical History: Positive for: Type II Diabetes Time with diabetes: 16 years Treated with: Insulin, Oral agents Blood sugar tested every day: Yes Tested : daily Blood sugar testing results: Lunch: 114 Musculoskeletal Medical History: Positive for: Osteoarthritis Neurologic Medical History: Positive for: Neuropathy HBO Extended History Items Eyes: Cataracts Immunizations Pneumococcal Vaccine: Received Pneumococcal Vaccination: No Implantable Devices None Family and Social History TYLENA, [name] ([id number]) Never smoker; Marital Status - Single; Alcohol Use: Rarely; Drug Use: No History; Caffeine Use: Rarely; Financial Concerns: No; Food, Clothing or Shelter Needs: No; Support System Lacking: No; Transportation Concerns: No Electronic Signature(s) Signed: 03/31/2021 10:44:58 AM By: Kalman Shan DO Signed: 03/31/2021 1:16:38 PM By: Carlene Coria RN Entered  By: Carlene Coria on 03/31/2021 09:01:56 Mance, [name] ([id number]) -------------------------------------------------------------------------------- SuperBill Details Patient Name: [name] Date of Service: 03/31/2021 Medical Record Number: [id number] Patient Account Number: [id number] Date of Birth/Sex: 01-27-1966 (56 y.o. F) Treating RN: Carlene Coria Primary Care Provider: Iona Beard Other Clinician: Referring Provider: Referral, Self Treating Provider/Extender: Yaakov Guthrie in Treatment: 0 Diagnosis Coding ICD-10 Codes Code Description [phone] Non-pressure chronic ulcer of other part of left lower leg with fat layer exposed I89.0 Lymphedema, not elsewhere classified I87.2 Venous insufficiency (chronic) (peripheral) E11.622 Type 2 diabetes mellitus with other skin ulcer N18.30 Chronic kidney disease, stage 3 unspecified 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.84 Bariatric surgery status Facility Procedures CPT4 Code: [id number] Description: (Facility Use Only) [phone] - [place] LWR LT LEG Modifier: Quantity: 1 Physician Procedures CPT4 Code: 0102725 Description: 99213 - WC PHYS LEVEL 3 - EST PT Modifier: Quantity: 1 CPT4 Code: Description: ICD-10 Diagnosis Description L97.822 Non-pressure chronic ulcer of other part of left lower leg with fat lay I89.0 Lymphedema, not elsewhere classified I87.2 Venous insufficiency (chronic) (peripheral) E11.622 Type 2 diabetes mellitus with  other skin ulcer Modifier: er exposed Quantity: Electronic Signature(s) Signed: 03/31/2021 10:44:58 AM By: Kalman Shan DO Entered By: Kalman Shan on 03/31/2021 10:44:34

## 2021-04-02 DIAGNOSIS — E108 Type 1 diabetes mellitus with unspecified complications: Secondary | ICD-10-CM | POA: Diagnosis not present

## 2021-04-02 DIAGNOSIS — Z794 Long term (current) use of insulin: Secondary | ICD-10-CM | POA: Diagnosis not present

## 2021-04-07 ENCOUNTER — Other Ambulatory Visit: Payer: Self-pay

## 2021-04-07 ENCOUNTER — Encounter (HOSPITAL_BASED_OUTPATIENT_CLINIC_OR_DEPARTMENT_OTHER): Payer: Medicare Other | Admitting: Internal Medicine

## 2021-04-07 DIAGNOSIS — E11622 Type 2 diabetes mellitus with other skin ulcer: Secondary | ICD-10-CM | POA: Diagnosis not present

## 2021-04-07 DIAGNOSIS — I5042 Chronic combined systolic (congestive) and diastolic (congestive) heart failure: Secondary | ICD-10-CM | POA: Diagnosis not present

## 2021-04-07 DIAGNOSIS — I130 Hypertensive heart and chronic kidney disease with heart failure and stage 1 through stage 4 chronic kidney disease, or unspecified chronic kidney disease: Secondary | ICD-10-CM | POA: Diagnosis not present

## 2021-04-07 DIAGNOSIS — N183 Chronic kidney disease, stage 3 unspecified: Secondary | ICD-10-CM | POA: Diagnosis not present

## 2021-04-07 DIAGNOSIS — I890 Lymphedema, not elsewhere classified: Secondary | ICD-10-CM | POA: Diagnosis not present

## 2021-04-07 DIAGNOSIS — L97822 Non-pressure chronic ulcer of other part of left lower leg with fat layer exposed: Secondary | ICD-10-CM | POA: Diagnosis not present

## 2021-04-07 DIAGNOSIS — Z9884 Bariatric surgery status: Secondary | ICD-10-CM | POA: Diagnosis not present

## 2021-04-07 NOTE — Progress Notes (Signed)
[name], [name] ([id number]) Visit Report for 04/07/2021 Arrival Information Details Patient Name: [name], [name] Date of Service: 04/07/2021 8:15 AM Medical Record Number: [id number] Patient Account Number: [id number] Date of Birth/Sex: [birth year] (56 y.o. F) Treating RN: Levora Dredge Primary Care Elona Yinger: Iona Beard Other Clinician: Referring Coren Crownover: Iona Beard Treating Maaz Spiering/Extender: Yaakov Guthrie in Treatment: 1 Visit Information History Since Last Visit Added or deleted any medications: No Patient Arrived: Ambulatory Any new allergies or adverse reactions: No Arrival Time: 08:08 Had a fall or experienced change in No Accompanied By: self activities of daily living that may affect Transfer Assistance: None risk of falls: Patient Identification Verified: Yes Hospitalized since last visit: No Secondary Verification Process Completed: Yes Has Dressing in Place as Prescribed: Yes Patient Requires Transmission-Based No Pain Present Now: No Precautions: Patient Has Alerts: Yes Patient Alerts: DIABETIC R ABI 1.39/.80 03/10/21 L ABI 1.29/.74 03/10/21 Electronic Signature(s) Signed: 04/07/2021 4:45:40 PM By: Levora Dredge Entered By: Levora Dredge on 04/07/2021 08:10:19 [name] ([id number]) -------------------------------------------------------------------------------- Clinic Level of Care Assessment Details Patient Name: [name] Date of Service: 04/07/2021 8:15 AM Medical Record Number: [id number] Patient Account Number: [id number] Date of Birth/Sex: [birth year] (56 y.o. F) Treating RN: Levora Dredge Primary Care Oneal Schoenberger: Iona Beard Other Clinician: Referring Latia Mataya: Iona Beard Treating Kassadee Carawan/Extender: Yaakov Guthrie in Treatment: 1 Clinic Level of Care Assessment Items TOOL 1 Quantity Score []  - Use when EandM and Procedure is performed on INITIAL visit 0 ASSESSMENTS - Nursing Assessment /  Reassessment []  - General Physical Exam (combine w/ comprehensive assessment (listed just below) when performed on new 0 pt. evals) []  - 0 Comprehensive Assessment (HX, ROS, Risk Assessments, Wounds Hx, etc.) ASSESSMENTS - Wound and Skin Assessment / Reassessment []  - Dermatologic / Skin Assessment (not related to wound area) 0 ASSESSMENTS - Ostomy and/or Continence Assessment and Care []  - Incontinence Assessment and Management 0 []  - 0 Ostomy Care Assessment and Management (repouching, etc.) PROCESS - Coordination of Care []  - Simple Patient / Family Education for ongoing care 0 []  - 0 Complex (extensive) Patient / Family Education for ongoing care []  - 0 Staff obtains Programmer, systems, Records, Test Results / Process Orders []  - 0 Staff telephones HHA, Nursing Homes / Clarify orders / etc []  - 0 Routine Transfer to another Facility (non-emergent condition) []  - 0 Routine Hospital Admission (non-emergent condition) []  - 0 New Admissions / Biomedical engineer / Ordering NPWT, Apligraf, etc. []  - 0 Emergency Hospital Admission (emergent condition) PROCESS - Special Needs []  - Pediatric / Minor Patient Management 0 []  - 0 Isolation Patient Management []  - 0 Hearing / Language / Visual special needs []  - 0 Assessment of Community assistance (transportation, D/C planning, etc.) []  - 0 Additional assistance / Altered mentation []  - 0 Support Surface(s) Assessment (bed, cushion, seat, etc.) INTERVENTIONS - Miscellaneous []  - External ear exam 0 []  - 0 Patient Transfer (multiple staff / Civil Service fast streamer / Similar devices) []  - 0 Simple Staple / Suture removal (25 or less) []  - 0 Complex Staple / Suture removal (26 or more) []  - 0 Hypo/Hyperglycemic Management (do not check if billed separately) []  - 0 Ankle / Brachial Index (ABI) - do not check if billed separately Has the patient been seen at the hospital within the last three years: Yes Total Score: 0 Level Of Care:  ____ [name] ([id number]) Electronic Signature(s) Signed: 04/07/2021 4:45:40 PM By: Levora Dredge Entered By: Levora Dredge on 04/07/2021 08:54:03 [name], Lori R. (  [id number]) -------------------------------------------------------------------------------- Lower Extremity Assessment Details Patient Name: [name] Date of Service: 04/07/2021 8:15 AM Medical Record Number: [id number] Patient Account Number: [id number] Date of Birth/Sex: 1965/04/03 (56 y.o. F) Treating RN: Levora Dredge Primary Care Hargun Spurling: Iona Beard Other Clinician: Referring Percy Winterrowd: Iona Beard Treating Sumire Halbleib/Extender: Yaakov Guthrie in Treatment: 1 Edema Assessment Assessed: [Left: No] [Right: No] [Left: Edema] [Right: :] Calf Left: Right: Point of Measurement: 30 cm From Medial Instep 47.5 cm Ankle Left: Right: Point of Measurement: 10 cm From Medial Instep 28 cm Vascular Assessment Pulses: Dorsalis Pedis Palpable: [Left:Yes] Posterior Tibial Palpable: [Left:Yes] Electronic Signature(s) Signed: 04/07/2021 4:45:40 PM By: Levora Dredge Entered By: Levora Dredge on 04/07/2021 08:23:37 [name] ([id number]) -------------------------------------------------------------------------------- Multi Wound Chart Details Patient Name: [name] Date of Service: 04/07/2021 8:15 AM Medical Record Number: [id number] Patient Account Number: [id number] Date of Birth/Sex: Nov 05, 1965 (55 y.o. F) Treating RN: Levora Dredge Primary Care Krishawn Vanderweele: Iona Beard Other Clinician: Referring Seddrick Flax: Iona Beard Treating Dreydon Cardenas/Extender: Yaakov Guthrie in Treatment: 1 Vital Signs Height(in): 60 Pulse(bpm): 7 Weight(lbs): 824 Blood Pressure(mmHg): 144/78 Body Mass Index(BMI): 51.4 Temperature(F): 97.7 Respiratory Rate(breaths/min): 18 Photos: [N/A:N/A] Wound Location: Left, Medial Lower Leg N/A N/A Wounding Event: Gradually  Appeared N/A N/A Primary Etiology: Diabetic Wound/Ulcer of the Lower N/A N/A Extremity Comorbid History: Cataracts, Lymphedema, Asthma, N/A N/A Congestive Heart Failure, Hypertension, Type II Diabetes, Osteoarthritis, Neuropathy Date Acquired: 03/18/2021 N/A N/A Weeks of Treatment: 1 N/A N/A Wound Status: Open N/A N/A Wound Recurrence: No N/A N/A Measurements L x W x D (cm) 0.9x1.8x0.1 N/A N/A Area (cm) : 1.272 N/A N/A Volume (cm) : 0.127 N/A N/A % Reduction in Area: 50.20% N/A N/A % Reduction in Volume: 50.20% N/A N/A Classification: Grade 2 N/A N/A Exudate Amount: Medium N/A N/A Exudate Type: Serosanguineous N/A N/A Exudate Color: red, brown N/A N/A Granulation Amount: Large (67-100%) N/A N/A Granulation Quality: Red N/A N/A Necrotic Amount: Small (1-33%) N/A N/A Exposed Structures: Fat Layer (Subcutaneous Tissue): N/A N/A Yes Fascia: No Tendon: No Muscle: No Joint: No Bone: No Epithelialization: None N/A N/A Debridement: Debridement - Excisional N/A N/A Pre-procedure Verification/Time 08:51 N/A N/A Out Taken: Pain Control: Lidocaine 4% Topical Solution N/A N/A Tissue Debrided: Subcutaneous, Slough N/A N/A Level: Skin/Subcutaneous Tissue N/A N/A Debridement Area (sq cm): 1.62 N/A N/A Instrument: Curette N/A N/A Bleeding: Minimum N/A N/A Hemostasis Achieved: Pressure N/A N/A Procedure was tolerated well N/A N/A Gauger, Milta R. ([id number]) Debridement Treatment Response: Post Debridement 0.9x1.8x0.1 N/A N/A Measurements L x W x D (cm) Post Debridement Volume: 0.127 N/A N/A (cm) Procedures Performed: Debridement N/A N/A Treatment Notes Electronic Signature(s) Signed: 04/07/2021 4:45:40 PM By: Levora Dredge Entered By: Levora Dredge on 04/07/2021 08:54:51 Crossno, [name] ([id number]) -------------------------------------------------------------------------------- Multi-Disciplinary Care Plan Details Patient Name: [name] Date of  Service: 04/07/2021 8:15 AM Medical Record Number: [id number] Patient Account Number: [id number] Date of Birth/Sex: 11/23/1965 (55 y.o. F) Treating RN: Levora Dredge Primary Care Jhana Giarratano: Iona Beard Other Clinician: Referring Crandall Harvel: Iona Beard Treating Senai Kingsley/Extender: Yaakov Guthrie in Treatment: 1 Active Inactive Electronic Signature(s) Signed: 04/07/2021 4:45:40 PM By: Levora Dredge Entered By: Levora Dredge on 04/07/2021 08:54:38 Whitlatch, [name] ([id number]) -------------------------------------------------------------------------------- Pain Assessment Details Patient Name: [name] Date of Service: 04/07/2021 8:15 AM Medical Record Number: [id number] Patient Account Number: [id number] Date of Birth/Sex: 1965/05/03 (56 y.o. F) Treating RN: Levora Dredge Primary Care Vonita Calloway: Iona Beard Other Clinician: Referring Antar Milks: Iona Beard Treating Laval Cafaro/Extender: Yaakov Guthrie in Treatment: 1 Active  Problems Location of Pain Severity and Description of Pain Patient Has Paino No Site Locations Rate the pain. Current Pain Level: 0 Pain Management and Medication Current Pain Management: Electronic Signature(s) Signed: 04/07/2021 4:45:40 PM By: Levora Dredge Entered By: Levora Dredge on 04/07/2021 08:12:41 Whitcomb, [name] ([id number]) -------------------------------------------------------------------------------- Patient/Caregiver Education Details Patient Name: [name] Date of Service: 04/07/2021 8:15 AM Medical Record Number: [id number] Patient Account Number: [id number] Date of Birth/Gender: 09-27-1965 (56 y.o. F) Treating RN: Levora Dredge Primary Care Physician: Iona Beard Other Clinician: Referring Physician: Iona Beard Treating Physician/Extender: Yaakov Guthrie in Treatment: 1 Education Assessment Education Provided To: Patient Education Topics Provided Wound/Skin  Impairment: Handouts: Caring for Your Ulcer Methods: Explain/Verbal Responses: State content correctly Electronic Signature(s) Signed: 04/07/2021 4:45:40 PM By: Levora Dredge Entered By: Levora Dredge on 04/07/2021 08:54:25 Alden, [name] ([id number]) -------------------------------------------------------------------------------- Wound Assessment Details Patient Name: [name] Date of Service: 04/07/2021 8:15 AM Medical Record Number: [id number] Patient Account Number: [id number] Date of Birth/Sex: 02-22-1966 (55 y.o. F) Treating RN: Levora Dredge Primary Care Nathanael Krist: Iona Beard Other Clinician: Referring Cung Masterson: Iona Beard Treating Harper Vandervoort/Extender: Yaakov Guthrie in Treatment: 1 Wound Status Wound Number: 2 Primary Diabetic Wound/Ulcer of the Lower Extremity Etiology: Wound Location: Left, Medial Lower Leg Wound Open Wounding Event: Gradually Appeared Status: Date Acquired: 03/18/2021 Comorbid Cataracts, Lymphedema, Asthma, Congestive Heart Weeks Of Treatment: 1 History: Failure, Hypertension, Type II Diabetes, Osteoarthritis, Clustered Wound: No Neuropathy Photos Wound Measurements Length: (cm) 0.9 Width: (cm) 1.8 Depth: (cm) 0.1 Area: (cm) 1.272 Volume: (cm) 0.127 % Reduction in Area: 50.2% % Reduction in Volume: 50.2% Epithelialization: None Tunneling: No Undermining: No Wound Description Classification: Grade 2 Exudate Amount: Medium Exudate Type: Serosanguineous Exudate Color: red, brown Foul Odor After Cleansing: No Slough/Fibrino No Wound Bed Granulation Amount: Large (67-100%) Exposed Structure Granulation Quality: Red Fascia Exposed: No Necrotic Amount: Small (1-33%) Fat Layer (Subcutaneous Tissue) Exposed: Yes Necrotic Quality: Adherent Slough Tendon Exposed: No Muscle Exposed: No Joint Exposed: No Bone Exposed: No Electronic Signature(s) Signed: 04/07/2021 4:45:40 PM By: Levora Dredge Entered By:  Levora Dredge on 04/07/2021 08:23:06 Lasseigne, [name] ([id number]) -------------------------------------------------------------------------------- Vitals Details Patient Name: [name] Date of Service: 04/07/2021 8:15 AM Medical Record Number: [id number] Patient Account Number: [id number] Date of Birth/Sex: 1965-06-27 (56 y.o. F) Treating RN: Levora Dredge Primary Care Elleanna Melling: Iona Beard Other Clinician: Referring Ivyanna Sibert: Iona Beard Treating Serria Sloma/Extender: Yaakov Guthrie in Treatment: 1 Vital Signs Time Taken: 08:11 Temperature (F): 97.7 Height (in): 60 Pulse (bpm): 54 Weight (lbs): 263 Respiratory Rate (breaths/min): 18 Body Mass Index (BMI): 51.4 Blood Pressure (mmHg): 144/78 Reference Range: 80 - 120 mg / dl Electronic Signature(s) Signed: 04/07/2021 4:45:40 PM By: Levora Dredge Entered By: Levora Dredge on 04/07/2021 08:12:26

## 2021-04-07 NOTE — Progress Notes (Signed)
AMELY, VOORHEIS ([id number]) Visit Report for 04/07/2021 Chief Complaint Document Details Patient Name: [name], [name] Date of Service: 04/07/2021 8:15 AM Medical Record Number: [id number] Patient Account Number: [id number] Date of Birth/Sex: [birth year] (56 y.o. F) Treating RN: Levora Dredge Primary Care Provider: Iona Beard Other Clinician: Referring Provider: Iona Beard Treating Provider/Extender: Yaakov Guthrie in Treatment: 1 Information Obtained from: Patient Chief Complaint Evaluation for lymphedema pumps Electronic Signature(s) Signed: 04/07/2021 9:22:32 AM By: Kalman Shan DO Entered By: Kalman Shan on 04/07/2021 09:18:55 [name], [name] ([id number]) -------------------------------------------------------------------------------- Debridement Details Patient Name: [name] Date of Service: 04/07/2021 8:15 AM Medical Record Number: [id number] Patient Account Number: [id number] Date of Birth/Sex: [birth year] (55 y.o. F) Treating RN: Levora Dredge Primary Care Provider: Iona Beard Other Clinician: Referring Provider: Iona Beard Treating Provider/Extender: Yaakov Guthrie in Treatment: 1 Debridement Performed for Wound #2 Left,Medial Lower Leg Assessment: Performed By: Physician Kalman Shan, MD Debridement Type: Debridement Severity of Tissue Pre Debridement: Fat layer exposed Level of Consciousness (Pre- Awake and Alert procedure): Pre-procedure Verification/Time Out Yes - 08:51 Taken: Pain Control: Lidocaine 4% Topical Solution Total Area Debrided (L x W): 0.9 (cm) x 1.8 (cm) = 1.62 (cm) Tissue and other material Non-Viable, Slough, Subcutaneous, Slough debrided: Level: Skin/Subcutaneous Tissue Debridement Description: Excisional Instrument: Curette Bleeding: Minimum Hemostasis Achieved: Pressure Response to Treatment: Procedure was tolerated well Level of Consciousness (Post- Awake and  Alert procedure): Post Debridement Measurements of Total Wound Length: (cm) 0.9 Width: (cm) 1.8 Depth: (cm) 0.1 Volume: (cm) 0.127 Character of Wound/Ulcer Post Debridement: Stable Severity of Tissue Post Debridement: Fat layer exposed Post Procedure Diagnosis Same as Pre-procedure Electronic Signature(s) Signed: 04/07/2021 9:22:32 AM By: Kalman Shan DO Signed: 04/07/2021 4:45:40 PM By: Levora Dredge Entered By: Levora Dredge on 04/07/2021 08:52:43 [name], [name] ([id number]) -------------------------------------------------------------------------------- HPI Details Patient Name: [name] Date of Service: 04/07/2021 8:15 AM Medical Record Number: [id number] Patient Account Number: [id number] Date of Birth/Sex: 23-Jun-1965 (55 y.o. F) Treating RN: Levora Dredge Primary Care Provider: Iona Beard Other Clinician: Referring Provider: Iona Beard Treating Provider/Extender: Yaakov Guthrie in Treatment: 1 History of Present Illness HPI Description: 10/20/2020 upon evaluation today patient appears to be doing somewhat poorly in regard to her left leg where she has a significant wound although not very deep but this is definitely due to her chronic swelling. Fortunately there does not appear to be any signs of active infection at this time which is great news. There was a lot of scabbing going on and then she had areas that were somewhat open up underneath. It does appear to be very lymphedema like in nature. With that being said I do not see any signs of active infection currently. I do think that based on what we are seeing at the moment this is likely an area that will respond well to appropriate compression therapy. If it does not then we will need to reevaluate the situation for sure. The patient does have a history of diabetes mellitus type 2, chronic venous insufficiency, lymphedema, chronic kidney disease stage III, and congestive heart failure.  She is status post bariatric surgery as well. Overall I am pleased with where things stand I think she does need to definitely have compression in place. This wound has been present for about 4 to 6 weeks she tells me. 10/29/2020 upon evaluation today patient actually appears to be doing much better. She has been tolerating the dressing changes without complication and the compression wrap did slide down her  a bit but to be perfectly honest I am very pleased overall with well the wound appears today. I do not see any signs of active infection which is also great news. Nonetheless I think that we are definitely headed in the appropriate direction as far as healing is concerned and in fact this is significantly improved even as compared to just last week. 11/06/2020 upon evaluation today patient appears to be doing well with regard to her wounds. In fact everything appears to be completely healed which is great news. I am extremely pleased with where things stand today. No fevers, chills, nausea, vomiting, or diarrhea. 02/17/2021 [name] is a 56 year old female with a past medical history of insulin-dependent type 2 diabetes, CKD stage III, bariatric surgery and lymphedema that presents to the clinic because she would like to order lymphedema pumps. She was seen in our clinic in August and September for an open wound to her left lower extremity due to lymphedema. She was treated With 4-layer compression for 2-1/2 weeks with resolution of her wound. She has not developed wounds since her discharge in September. She has compression stockings but states she cannot put these on daily. Readmission 03/31/2021 Patient presents with an open wound today. She was last seen on 02/17/2021 for lymphedema pump order. She states that she is working with insurance to get these covered. She still has not received them. She also has not been using compression stockings daily. She states these are difficult to  put on. She developed a wound 2 weeks ago and has been keeping the area covered. She currently denies signs of infection. 2/8; patient presents for follow-up. She reports receiving lymphedema pumps however these were the wrong size. She states she is getting these replaced and sent in the mail soon. She did receive her juxta lite compression. She had no issues or complaints with the compression wrap this past week. She denies signs of infection. Electronic Signature(s) Signed: 04/07/2021 9:22:32 AM By: Kalman Shan DO Entered By: Kalman Shan on 04/07/2021 09:19:35 [name], [name] ([id number]) -------------------------------------------------------------------------------- Physical Exam Details Patient Name: [name] Date of Service: 04/07/2021 8:15 AM Medical Record Number: [id number] Patient Account Number: [id number] Date of Birth/Sex: 06/26/1965 (55 y.o. F) Treating RN: Levora Dredge Primary Care Provider: Iona Beard Other Clinician: Referring Provider: Iona Beard Treating Provider/Extender: Yaakov Guthrie in Treatment: 1 Constitutional . Cardiovascular . Psychiatric . Notes Left lower extremity: To the distal medial aspect there is an open wound with granulation tissue And nonviable tissue. 2+ pitting edema to the knee. No signs of infection. Electronic Signature(s) Signed: 04/07/2021 9:22:32 AM By: Kalman Shan DO Entered By: Kalman Shan on 04/07/2021 09:20:00 [name], [name] ([id number]) -------------------------------------------------------------------------------- Physician Orders Details Patient Name: [name] Date of Service: 04/07/2021 8:15 AM Medical Record Number: [id number] Patient Account Number: [id number] Date of Birth/Sex: [birth year] (55 y.o. F) Treating RN: Levora Dredge Primary Care Provider: Iona Beard Other Clinician: Referring Provider: Iona Beard Treating Provider/Extender: Yaakov Guthrie in Treatment: 1 Verbal / Phone Orders: No Diagnosis Coding Discharge From [facility] o Moisturize legs daily after removing compression garments. o Elevate, Exercise Daily and Avoid Standing for Long Periods of Time. o DO YOUR BEST to sleep in the bed at night. DO NOT sleep in your recliner. Long hours of sitting in a recliner leads to swelling of the legs and/or potential wounds on your backside. Bathing/ Shower/ Hygiene o May shower with wound dressing protected with water repellent cover or cast  protector. o No tub bath. Edema Control - Lymphedema / Segmental Compressive Device / Other o Optional: One layer of unna paste to top of compression wrap (to act as an anchor). o Compression Pump: Use compression pump on left lower extremity for 60 minutes, twice daily. o Compression Pump: Use compression pump on right lower extremity for 60 minutes, twice daily. o DO YOUR BEST to sleep in the bed at night. DO NOT sleep in your recliner. Long hours of sitting in a recliner leads to swelling of the legs and/or potential wounds on your backside. Wound Treatment Wound #2 - Lower Leg Wound Laterality: Left, Medial Primary Dressing: Silvercel Small 2x2 (in/in) 1 x Per Week/30 Days Discharge Instructions: Apply Silvercel Small 2x2 (in/in) as instructed Secondary Dressing: ABD Pad 5x9 (in/in) 1 x Per Week/30 Days Discharge Instructions: Cover with ABD pad Compression Wrap: Medichoice 4 layer Compression System, 35-40 mmHG 1 x Per Week/30 Days Discharge Instructions: Apply multi-layer wrap as directed. Compression Stockings: Circaid Juxta Lite Compression Wrap Left Leg Compression Amount: 30-40 mmHG Discharge Instructions: Apply Circaid Juxta Lite Compression Wrap as directed Electronic Signature(s) Signed: 04/07/2021 9:22:32 AM By: Kalman Shan DO Entered By: Kalman Shan on 04/07/2021 09:22:11 [name], [name]  ([id number]) -------------------------------------------------------------------------------- Problem List Details Patient Name: [name] Date of Service: 04/07/2021 8:15 AM Medical Record Number: [id number] Patient Account Number: [id number] Date of Birth/Sex: 01-04-1966 (55 y.o. F) Treating RN: Levora Dredge Primary Care Provider: Iona Beard Other Clinician: Referring Provider: Iona Beard Treating Provider/Extender: Yaakov Guthrie in Treatment: 1 Active Problems ICD-10 Encounter Code Description Active Date MDM Diagnosis [phone] Non-pressure chronic ulcer of other part of left lower leg with fat layer 03/31/2021 No Yes exposed I89.0 Lymphedema, not elsewhere classified 03/31/2021 No Yes I87.2 Venous insufficiency (chronic) (peripheral) 03/31/2021 No Yes E11.622 Type 2 diabetes mellitus with other skin ulcer 03/31/2021 No Yes N18.30 Chronic kidney disease, stage 3 unspecified 03/31/2021 No Yes I50.42 Chronic combined systolic (congestive) and diastolic (congestive) heart 03/31/2021 No Yes failure Z98.84 Bariatric surgery status 03/31/2021 No Yes Inactive Problems Resolved Problems Electronic Signature(s) Signed: 04/07/2021 9:22:32 AM By: Kalman Shan DO Entered By: Kalman Shan on 04/07/2021 09:18:50 [name], [name] ([id number]) -------------------------------------------------------------------------------- Progress Note Details Patient Name: [name] Date of Service: 04/07/2021 8:15 AM Medical Record Number: [id number] Patient Account Number: [id number] Date of Birth/Sex: November 04, 1965 (55 y.o. F) Treating RN: Levora Dredge Primary Care Provider: Iona Beard Other Clinician: Referring Provider: Iona Beard Treating Provider/Extender: Yaakov Guthrie in Treatment: 1 Subjective Chief Complaint Information obtained from Patient Evaluation for lymphedema pumps History of Present Illness (HPI) 10/20/2020 upon evaluation today patient  appears to be doing somewhat poorly in regard to her left leg where she has a significant wound although not very deep but this is definitely due to her chronic swelling. Fortunately there does not appear to be any signs of active infection at this time which is great news. There was a lot of scabbing going on and then she had areas that were somewhat open up underneath. It does appear to be very lymphedema like in nature. With that being said I do not see any signs of active infection currently. I do think that based on what we are seeing at the moment this is likely an area that will respond well to appropriate compression therapy. If it does not then we will need to reevaluate the situation for sure. The patient does have a history of diabetes mellitus type 2, chronic venous insufficiency, lymphedema, chronic  kidney disease stage III, and congestive heart failure. She is status post bariatric surgery as well. Overall I am pleased with where things stand I think she does need to definitely have compression in place. This wound has been present for about 4 to 6 weeks she tells me. 10/29/2020 upon evaluation today patient actually appears to be doing much better. She has been tolerating the dressing changes without complication and the compression wrap did slide down her a bit but to be perfectly honest I am very pleased overall with well the wound appears today. I do not see any signs of active infection which is also great news. Nonetheless I think that we are definitely headed in the appropriate direction as far as healing is concerned and in fact this is significantly improved even as compared to just last week. 11/06/2020 upon evaluation today patient appears to be doing well with regard to her wounds. In fact everything appears to be completely healed which is great news. I am extremely pleased with where things stand today. No fevers, chills, nausea, vomiting, or diarrhea. 02/17/2021 [name] is a 56 year old female with a past medical history of insulin-dependent type 2 diabetes, CKD stage III, bariatric surgery and lymphedema that presents to the clinic because she would like to order lymphedema pumps. She was seen in our clinic in August and September for an open wound to her left lower extremity due to lymphedema. She was treated With 4-layer compression for 2-1/2 weeks with resolution of her wound. She has not developed wounds since her discharge in September. She has compression stockings but states she cannot put these on daily. Readmission 03/31/2021 Patient presents with an open wound today. She was last seen on 02/17/2021 for lymphedema pump order. She states that she is working with insurance to get these covered. She still has not received them. She also has not been using compression stockings daily. She states these are difficult to put on. She developed a wound 2 weeks ago and has been keeping the area covered. She currently denies signs of infection. 2/8; patient presents for follow-up. She reports receiving lymphedema pumps however these were the wrong size. She states she is getting these replaced and sent in the mail soon. She did receive her juxta lite compression. She had no issues or complaints with the compression wrap this past week. She denies signs of infection. Objective Constitutional Vitals Time Taken: 8:11 AM, Height: 60 in, Weight: 263 lbs, BMI: 51.4, Temperature: 97.7 F, Pulse: 54 bpm, Respiratory Rate: 18 breaths/min, Blood Pressure: 144/78 mmHg. General Notes: Left lower extremity: To the distal medial aspect there is an open wound with granulation tissue And nonviable tissue. 2+ pitting edema to the knee. No signs of infection. Integumentary (Hair, Skin) Wound #2 status is Open. Original cause of wound was Gradually Appeared. The date acquired was: 03/18/2021. The wound has been in treatment 1 weeks. The wound is located on the  Left,Medial Lower Leg. The wound measures 0.9cm length x 1.8cm width x 0.1cm depth; 1.272cm^2 area and 0.127cm^3 volume. There is Fat Layer (Subcutaneous Tissue) exposed. There is no tunneling or undermining noted. There is a medium Spitzley, Roselie R. ([id number]) amount of serosanguineous drainage noted. There is large (67-100%) red granulation within the wound bed. There is a small (1-33%) amount of necrotic tissue within the wound bed including Adherent Slough. Assessment Active Problems ICD-10 Non-pressure chronic ulcer of other part of left lower leg with fat layer exposed Lymphedema, not elsewhere classified Venous  insufficiency (chronic) (peripheral) Type 2 diabetes mellitus with other skin ulcer Chronic kidney disease, stage 3 unspecified Chronic combined systolic (congestive) and diastolic (congestive) heart failure Bariatric surgery status Patient's wound has shown improvement in size in appearance since last clinic visit. I debrided nonviable tissue. No surrounding signs of infection. I recommended continuing with silver alginate under 4-layer compression. Procedures Wound #2 Pre-procedure diagnosis of Wound #2 is a Diabetic Wound/Ulcer of the Lower Extremity located on the Left,Medial Lower Leg .Severity of Tissue Pre Debridement is: Fat layer exposed. There was a Excisional Skin/Subcutaneous Tissue Debridement with a total area of 1.62 sq cm performed by Kalman Shan, MD. With the following instrument(s): Curette to remove Non-Viable tissue/material. Material removed includes Subcutaneous Tissue and Slough and after achieving pain control using Lidocaine 4% Topical Solution. No specimens were taken. A time out was conducted at 08:51, prior to the start of the procedure. A Minimum amount of bleeding was controlled with Pressure. The procedure was tolerated well. Post Debridement Measurements: 0.9cm length x 1.8cm width x 0.1cm depth; 0.127cm^3 volume. Character of  Wound/Ulcer Post Debridement is stable. Severity of Tissue Post Debridement is: Fat layer exposed. Post procedure Diagnosis Wound #2: Same as Pre-Procedure Plan Discharge From [facility]: Moisturize legs daily after removing compression garments. Elevate, Exercise Daily and Avoid Standing for Long Periods of Time. DO YOUR BEST to sleep in the bed at night. DO NOT sleep in your recliner. Long hours of sitting in a recliner leads to swelling of the legs and/or potential wounds on your backside. Bathing/ Shower/ Hygiene: May shower with wound dressing protected with water repellent cover or cast protector. No tub bath. Edema Control - Lymphedema / Segmental Compressive Device / Other: Optional: One layer of unna paste to top of compression wrap (to act as an anchor). Compression Pump: Use compression pump on left lower extremity for 60 minutes, twice daily. Compression Pump: Use compression pump on right lower extremity for 60 minutes, twice daily. DO YOUR BEST to sleep in the bed at night. DO NOT sleep in your recliner. Long hours of sitting in a recliner leads to swelling of the legs and/or potential wounds on your backside. WOUND #2: - Lower Leg Wound Laterality: Left, Medial Primary Dressing: Silvercel Small 2x2 (in/in) 1 x Per Week/30 Days Discharge Instructions: Apply Silvercel Small 2x2 (in/in) as instructed Secondary Dressing: ABD Pad 5x9 (in/in) 1 x Per Week/30 Days Discharge Instructions: Cover with ABD pad Compression Wrap: Medichoice 4 layer Compression System, 35-40 mmHG 1 x Per Week/30 Days Discharge Instructions: Apply multi-layer wrap as directed. Compression Stockings: Circaid Juxta Lite Compression Wrap Compression Amount: 30-40 mmHg (left) Discharge Instructions: Apply Circaid Juxta Lite Compression Wrap as directed [name], Lori R. ([id number]) 1. In office sharp debridement 2. Silver alginate under 4-layer compression 3. Follow-up in 1 week Electronic  Signature(s) Signed: 04/07/2021 9:22:32 AM By: Kalman Shan DO Entered By: Kalman Shan on 04/07/2021 09:21:23 Petroni, [name] ([id number]) -------------------------------------------------------------------------------- SuperBill Details Patient Name: [name] Date of Service: 04/07/2021 Medical Record Number: [id number] Patient Account Number: [id number] Date of Birth/Sex: [birth year] (55 y.o. F) Treating RN: Levora Dredge Primary Care Provider: Iona Beard Other Clinician: Referring Provider: Iona Beard Treating Provider/Extender: Yaakov Guthrie in Treatment: 1 Diagnosis Coding ICD-10 Codes Code Description [phone] Non-pressure chronic ulcer of other part of left lower leg with fat layer exposed I89.0 Lymphedema, not elsewhere classified I87.2 Venous insufficiency (chronic) (peripheral) E11.622 Type 2 diabetes mellitus with other skin ulcer N18.30 Chronic kidney disease, stage 3  unspecified 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.84 Bariatric surgery status Facility Procedures CPT4 Code: [id number] Description: 40102 - DEB SUBQ TISSUE 20 SQ CM/< Modifier: Quantity: 1 CPT4 Code: Description: ICD-10 Diagnosis Description L97.822 Non-pressure chronic ulcer of other part of left lower leg with fat layer Modifier: exposed Quantity: Physician Procedures CPT4 Code: [id number] Description: 11042 - WC PHYS SUBQ TISS 20 SQ CM Modifier: Quantity: 1 CPT4 Code: Description: ICD-10 Diagnosis Description L97.822 Non-pressure chronic ulcer of other part of left lower leg with fat layer Modifier: exposed Quantity: Electronic Signature(s) Signed: 04/07/2021 9:22:32 AM By: Kalman Shan DO Entered By: Kalman Shan on 04/07/2021 09:21:40

## 2021-04-14 ENCOUNTER — Encounter (HOSPITAL_BASED_OUTPATIENT_CLINIC_OR_DEPARTMENT_OTHER): Payer: Medicare Other | Admitting: Internal Medicine

## 2021-04-14 ENCOUNTER — Other Ambulatory Visit: Payer: Self-pay

## 2021-04-14 DIAGNOSIS — E11622 Type 2 diabetes mellitus with other skin ulcer: Secondary | ICD-10-CM

## 2021-04-14 DIAGNOSIS — L97822 Non-pressure chronic ulcer of other part of left lower leg with fat layer exposed: Secondary | ICD-10-CM | POA: Diagnosis not present

## 2021-04-14 DIAGNOSIS — I890 Lymphedema, not elsewhere classified: Secondary | ICD-10-CM | POA: Diagnosis not present

## 2021-04-14 DIAGNOSIS — I5042 Chronic combined systolic (congestive) and diastolic (congestive) heart failure: Secondary | ICD-10-CM | POA: Diagnosis not present

## 2021-04-14 DIAGNOSIS — N183 Chronic kidney disease, stage 3 unspecified: Secondary | ICD-10-CM | POA: Diagnosis not present

## 2021-04-14 DIAGNOSIS — Z9884 Bariatric surgery status: Secondary | ICD-10-CM | POA: Diagnosis not present

## 2021-04-14 DIAGNOSIS — I130 Hypertensive heart and chronic kidney disease with heart failure and stage 1 through stage 4 chronic kidney disease, or unspecified chronic kidney disease: Secondary | ICD-10-CM | POA: Diagnosis not present

## 2021-04-14 NOTE — Progress Notes (Signed)
KELLE, RUPPERT ([id number]) Visit Report for 04/14/2021 Arrival Information Details Patient Name: [name], [name] Date of Service: 04/14/2021 8:15 AM Medical Record Number: [id number] Patient Account Number: [id number] Date of Birth/Sex: [birth year] (56 y.o. F) Treating RN: Levora Dredge Primary Care Ryszard Socarras: Iona Beard Other Clinician: Referring Rayvion Stumph: Iona Beard Treating Adaira Centola/Extender: Yaakov Guthrie in Treatment: 2 Visit Information History Since Last Visit Added or deleted any medications: No Patient Arrived: Ambulatory Any new allergies or adverse reactions: No Arrival Time: 08:04 Hospitalized since last visit: No Accompanied By: self Has Dressing in Place as Prescribed: Yes Transfer Assistance: None Pain Present Now: No Patient Identification Verified: Yes Secondary Verification Process Completed: Yes Patient Requires Transmission-Based No Precautions: Patient Has Alerts: Yes Patient Alerts: DIABETIC R ABI 1.39/.80 03/10/21 L ABI 1.29/.74 03/10/21 Electronic Signature(s) Signed: 04/14/2021 5:33:03 PM By: Levora Dredge Entered By: Levora Dredge on 04/14/2021 08:08:38 Beamer, [name] ([id number]) -------------------------------------------------------------------------------- Clinic Level of Care Assessment Details Patient Name: [name] Date of Service: 04/14/2021 8:15 AM Medical Record Number: [id number] Patient Account Number: [id number] Date of Birth/Sex: 1965/08/19 (55 y.o. F) Treating RN: Levora Dredge Primary Care Ruberta Holck: Iona Beard Other Clinician: Referring Camari Quintanilla: Iona Beard Treating Karlyn Glasco/Extender: Yaakov Guthrie in Treatment: 2 Clinic Level of Care Assessment Items TOOL 1 Quantity Score []  - Use when EandM and Procedure is performed on INITIAL visit 0 ASSESSMENTS - Nursing Assessment / Reassessment []  - General Physical Exam (combine w/ comprehensive assessment (listed just below)  when performed on new 0 pt. evals) []  - 0 Comprehensive Assessment (HX, ROS, Risk Assessments, Wounds Hx, etc.) ASSESSMENTS - Wound and Skin Assessment / Reassessment []  - Dermatologic / Skin Assessment (not related to wound area) 0 ASSESSMENTS - Ostomy and/or Continence Assessment and Care []  - Incontinence Assessment and Management 0 []  - 0 Ostomy Care Assessment and Management (repouching, etc.) PROCESS - Coordination of Care []  - Simple Patient / Family Education for ongoing care 0 []  - 0 Complex (extensive) Patient / Family Education for ongoing care []  - 0 Staff obtains Programmer, systems, Records, Test Results / Process Orders []  - 0 Staff telephones HHA, Nursing Homes / Clarify orders / etc []  - 0 Routine Transfer to another Facility (non-emergent condition) []  - 0 Routine Hospital Admission (non-emergent condition) []  - 0 New Admissions / Biomedical engineer / Ordering NPWT, Apligraf, etc. []  - 0 Emergency Hospital Admission (emergent condition) PROCESS - Special Needs []  - Pediatric / Minor Patient Management 0 []  - 0 Isolation Patient Management []  - 0 Hearing / Language / Visual special needs []  - 0 Assessment of Community assistance (transportation, D/C planning, etc.) []  - 0 Additional assistance / Altered mentation []  - 0 Support Surface(s) Assessment (bed, cushion, seat, etc.) INTERVENTIONS - Miscellaneous []  - External ear exam 0 []  - 0 Patient Transfer (multiple staff / Civil Service fast streamer / Similar devices) []  - 0 Simple Staple / Suture removal (25 or less) []  - 0 Complex Staple / Suture removal (26 or more) []  - 0 Hypo/Hyperglycemic Management (do not check if billed separately) []  - 0 Ankle / Brachial Index (ABI) - do not check if billed separately Has the patient been seen at the hospital within the last three years: Yes Total Score: 0 Level Of Care: ____ [name] ([id number]) Electronic Signature(s) Signed: 04/14/2021 5:33:03 PM By: Levora Dredge Entered By: Levora Dredge on 04/14/2021 09:18:24 Spaeth, [name] ([id number]) -------------------------------------------------------------------------------- Encounter Discharge Information Details Patient Name: [name] Date of Service: 04/14/2021 8:15 AM Medical  Record Number: [id number] Patient Account Number: [id number] Date of Birth/Sex: 1965/11/26 (56 y.o. F) Treating RN: Levora Dredge Primary Care Mardie Kellen: Iona Beard Other Clinician: Referring Chihiro Frey: Iona Beard Treating Tamesha Ellerbrock/Extender: Yaakov Guthrie in Treatment: 2 Encounter Discharge Information Items Post Procedure Vitals Discharge Condition: Stable Temperature (F): 97.6 Ambulatory Status: Ambulatory Pulse (bpm): 75 Discharge Destination: Home Respiratory Rate (breaths/min): 18 Transportation: Private Auto Blood Pressure (mmHg): 108/64 Accompanied By: self Schedule Follow-up Appointment: Yes Clinical Summary of Care: Electronic Signature(s) Signed: 04/14/2021 9:19:51 AM By: Levora Dredge Entered By: Levora Dredge on 04/14/2021 09:19:51 Ratcliffe, [name] ([id number]) -------------------------------------------------------------------------------- Lower Extremity Assessment Details Patient Name: [name] Date of Service: 04/14/2021 8:15 AM Medical Record Number: [id number] Patient Account Number: [id number] Date of Birth/Sex: 17-Mar-1965 (55 y.o. F) Treating RN: Levora Dredge Primary Care Breasia Karges: Iona Beard Other Clinician: Referring Aryona Sill: Iona Beard Treating Deshan Hemmelgarn/Extender: Yaakov Guthrie in Treatment: 2 Edema Assessment Assessed: [Left: No] [Right: No] Edema: [Left: Ye] [Right: s] Calf Left: Right: Point of Measurement: 30 cm From Medial Instep 50 cm Ankle Left: Right: Point of Measurement: 10 cm From Medial Instep 28 cm Vascular Assessment Pulses: Dorsalis Pedis Palpable: [Left:Yes] Posterior Tibial Palpable:  [Left:Yes] Electronic Signature(s) Signed: 04/14/2021 5:33:03 PM By: Levora Dredge Entered By: Levora Dredge on 04/14/2021 08:21:12 Ludemann, [name] ([id number]) -------------------------------------------------------------------------------- Multi Wound Chart Details Patient Name: [name] Date of Service: 04/14/2021 8:15 AM Medical Record Number: [id number] Patient Account Number: [id number] Date of Birth/Sex: Dec 19, 1965 (56 y.o. F) Treating RN: Levora Dredge Primary Care Ayisha Pol: Iona Beard Other Clinician: Referring Nylan Nevel: Iona Beard Treating Tyreon Frigon/Extender: Yaakov Guthrie in Treatment: 2 Vital Signs Height(in): 60 Pulse(bpm): 75 Weight(lbs): 263 Blood Pressure(mmHg): 108/64 Body Mass Index(BMI): 51.4 Temperature(F): 97.6 Respiratory Rate(breaths/min): 18 Photos: [N/A:N/A] Wound Location: Left, Medial Lower Leg N/A N/A Wounding Event: Gradually Appeared N/A N/A Primary Etiology: Diabetic Wound/Ulcer of the Lower N/A N/A Extremity Comorbid History: Cataracts, Lymphedema, Asthma, N/A N/A Congestive Heart Failure, Hypertension, Type II Diabetes, Osteoarthritis, Neuropathy Date Acquired: 03/18/2021 N/A N/A Weeks of Treatment: 2 N/A N/A Wound Status: Open N/A N/A Wound Recurrence: No N/A N/A Measurements L x W x D (cm) 1x1.5x0.1 N/A N/A Area (cm) : 1.178 N/A N/A Volume (cm) : 0.118 N/A N/A % Reduction in Area: 53.90% N/A N/A % Reduction in Volume: 53.70% N/A N/A Classification: Grade 2 N/A N/A Exudate Amount: Medium N/A N/A Exudate Type: Serosanguineous N/A N/A Exudate Color: red, brown N/A N/A Granulation Amount: Large (67-100%) N/A N/A Granulation Quality: Red N/A N/A Necrotic Amount: Small (1-33%) N/A N/A Exposed Structures: Fat Layer (Subcutaneous Tissue): N/A N/A Yes Fascia: No Tendon: No Muscle: No Joint: No Bone: No Epithelialization: None N/A N/A Treatment Notes Electronic Signature(s) Signed: 04/14/2021  5:33:03 PM By: Levora Dredge Entered By: Levora Dredge on 04/14/2021 08:49:17 Sellen, [name] ([id number]) -------------------------------------------------------------------------------- Multi-Disciplinary Care Plan Details Patient Name: [name] Date of Service: 04/14/2021 8:15 AM Medical Record Number: [id number] Patient Account Number: [id number] Date of Birth/Sex: [birth year] (56 y.o. F) Treating RN: Levora Dredge Primary Care Tonjia Parillo: Iona Beard Other Clinician: Referring Katelyn Kohlmeyer: Iona Beard Treating Winn [employer]uehl/Extender: Yaakov Guthrie in Treatment: 2 Active Inactive Electronic Signature(s) Signed: 04/14/2021 5:33:03 PM By: Levora Dredge Entered By: Levora Dredge on 04/14/2021 08:49:08 Weitzman, [name] ([id number]) -------------------------------------------------------------------------------- Pain Assessment Details Patient Name: [name] Date of Service: 04/14/2021 8:15 AM Medical Record Number: [id number] Patient Account Number: [id number] Date of Birth/Sex: 1965-08-24 (56 y.o. F) Treating RN: Levora Dredge Primary Care Zyaire Mccleod: Iona Beard Other Clinician: Referring  Sanvika Cuttino: Iona Beard Treating Tuvia Woodrick/Extender: Yaakov Guthrie in Treatment: 2 Active Problems Location of Pain Severity and Description of Pain Patient Has Paino No Site Locations Rate the pain. Current Pain Level: 0 Pain Management and Medication Current Pain Management: Electronic Signature(s) Signed: 04/14/2021 5:33:03 PM By: Levora Dredge Entered By: Levora Dredge on 04/14/2021 08:11:48 Forstrom, [name] ([id number]) -------------------------------------------------------------------------------- Patient/Caregiver Education Details Patient Name: [name] Date of Service: 04/14/2021 8:15 AM Medical Record Number: [id number] Patient Account Number: [id number] Date of Birth/Gender: 10-27-1965 (56 y.o.  F) Treating RN: Levora Dredge Primary Care Physician: Iona Beard Other Clinician: Referring Physician: Iona Beard Treating Physician/Extender: Yaakov Guthrie in Treatment: 2 Education Assessment Education Provided To: Patient Education Topics Provided Wound/Skin Impairment: Handouts: Caring for Your Ulcer Methods: Explain/Verbal Responses: State content correctly Electronic Signature(s) Signed: 04/14/2021 5:33:03 PM By: Levora Dredge Entered By: Levora Dredge on 04/14/2021 09:18:47 Griswold, [name] ([id number]) -------------------------------------------------------------------------------- Wound Assessment Details Patient Name: [name] Date of Service: 04/14/2021 8:15 AM Medical Record Number: [id number] Patient Account Number: [id number] Date of Birth/Sex: 1965/05/13 (55 y.o. F) Treating RN: Levora Dredge Primary Care Lalah Durango: Iona Beard Other Clinician: Referring Irvin Bastin: Iona Beard Treating Avondre Richens/Extender: Yaakov Guthrie in Treatment: 2 Wound Status Wound Number: 2 Primary Diabetic Wound/Ulcer of the Lower Extremity Etiology: Wound Location: Left, Medial Lower Leg Wound Open Wounding Event: Gradually Appeared Status: Date Acquired: 03/18/2021 Comorbid Cataracts, Lymphedema, Asthma, Congestive Heart Weeks Of Treatment: 2 History: Failure, Hypertension, Type II Diabetes, Osteoarthritis, Clustered Wound: No Neuropathy Photos Wound Measurements Length: (cm) 1 % Reduct Width: (cm) 1.5 % Reduct Depth: (cm) 0.1 Epitheli Area: (cm) 1.178 Tunneli Volume: (cm) 0.118 Undermi ion in Area: 53.9% ion in Volume: 53.7% alization: None ng: No ning: No Wound Description Classification: Grade 2 Foul Odo Exudate Amount: Medium Slough/F Exudate Type: Serosanguineous Exudate Color: red, brown r After Cleansing: No ibrino No Wound Bed Granulation Amount: Large (67-100%) Exposed Structure Granulation Quality: Red Fascia  Exposed: No Necrotic Amount: Small (1-33%) Fat Layer (Subcutaneous Tissue) Exposed: Yes Necrotic Quality: Adherent Slough Tendon Exposed: No Muscle Exposed: No Joint Exposed: No Bone Exposed: No Treatment Notes Wound #2 (Lower Leg) Wound Laterality: Left, Medial Cleanser Peri-Wound Care Topical Primary Dressing Vanderford, Ericha R. ([id number]) Silvercel Small 2x2 (in/in) Discharge Instruction: Apply Silvercel Small 2x2 (in/in) as instructed Secondary Dressing ABD Pad 5x9 (in/in) Discharge Instruction: Cover with ABD pad Secured With Compression Wrap Medichoice 4 layer Compression System, 35-40 mmHG Discharge Instruction: Apply multi-layer wrap as directed. Compression Stockings Circaid Juxta Lite Compression Wrap Quantity: 1 Left Leg Compression Amount: 30-40 mmHg Discharge Instruction: Apply Circaid Juxta Lite Compression Wrap as directed Add-Ons Electronic Signature(s) Signed: 04/14/2021 5:33:03 PM By: Levora Dredge Entered By: Levora Dredge on 04/14/2021 08:20:46 Simons, [name] ([id number]) -------------------------------------------------------------------------------- Vitals Details Patient Name: [name] Date of Service: 04/14/2021 8:15 AM Medical Record Number: [id number] Patient Account Number: [id number] Date of Birth/Sex: 1966/02/28 (56 y.o. F) Treating RN: Levora Dredge Primary Care Zeb Rawl: Iona Beard Other Clinician: Referring Glorian Mcdonell: Iona Beard Treating Geroge Gilliam/Extender: Yaakov Guthrie in Treatment: 2 Vital Signs Time Taken: 08:08 Temperature (F): 97.6 Height (in): 60 Pulse (bpm): 75 Weight (lbs): 263 Respiratory Rate (breaths/min): 18 Body Mass Index (BMI): 51.4 Blood Pressure (mmHg): 108/64 Reference Range: 80 - 120 mg / dl Electronic Signature(s) Signed: 04/14/2021 5:33:03 PM By: Levora Dredge Entered By: Levora Dredge on 04/14/2021 08:11:39

## 2021-04-14 NOTE — Progress Notes (Signed)
Lori, [name] ([id number]) Visit Report for 04/14/2021 Chief Complaint Document Details Patient Name: Lori, [name] Date of Service: 04/14/2021 8:15 AM Medical Record Number: [id number] Patient Account Number: [id number] Date of Birth/Sex: Mar 17, 1965 (56 y.o. F) Treating RN: [name] Primary Care Provider: Iona Beard Other Clinician: Referring Provider: Iona Beard Treating Provider/Extender: Yaakov Guthrie in Treatment: 2 Information Obtained from: Patient Chief Complaint Evaluation for lymphedema pumps 03/31/2021; left lower extremity ulcer Electronic Signature(s) Signed: 04/14/2021 9:43:17 AM By: Lori Shan DO Entered By: [name] on 04/14/2021 09:40:06 [name], [name] ([id number]) -------------------------------------------------------------------------------- Debridement Details Patient Name: [name] Date of Service: 04/14/2021 8:15 AM Medical Record Number: [id number] Patient Account Number: [id number] Date of Birth/Sex: 07-29-1965 (55 y.o. F) Treating RN: [name] Primary Care Provider: Iona Beard Other Clinician: Referring Provider: Iona Beard Treating Provider/Extender: Yaakov Guthrie in Treatment: 2 Debridement Performed for Wound #2 Left,Medial Lower Leg Assessment: Performed By: Physician Lori Shan, MD Debridement Type: Debridement Severity of Tissue Pre Debridement: Fat layer exposed Level of Consciousness (Pre- Awake and Alert procedure): Pre-procedure Verification/Time Out Yes - 08:49 Taken: Total Area Debrided (L x W): 1 (cm) x 1.5 (cm) = 1.5 (cm) Tissue and other material Viable, Non-Viable, Slough, Subcutaneous, Slough debrided: Level: Skin/Subcutaneous Tissue Debridement Description: Excisional Instrument: Curette Bleeding: Minimum Hemostasis Achieved: Pressure Response to Treatment: Procedure was tolerated well Level of Consciousness (Post- Awake and  Alert procedure): Post Debridement Measurements of Total Wound Length: (cm) 1 Width: (cm) 1.5 Depth: (cm) 0.1 Volume: (cm) 0.118 Character of Wound/Ulcer Post Debridement: Stable Severity of Tissue Post Debridement: Fat layer exposed Post Procedure Diagnosis Same as Pre-procedure Electronic Signature(s) Signed: 04/14/2021 9:43:17 AM By: Lori Shan DO Signed: 04/14/2021 5:33:03 PM By: [name] Entered By: [name] on 04/14/2021 08:50:06 [name], [name] ([id number]) -------------------------------------------------------------------------------- HPI Details Patient Name: [name] Date of Service: 04/14/2021 8:15 AM Medical Record Number: [id number] Patient Account Number: [id number] Date of Birth/Sex: February 15, 1966 (55 y.o. F) Treating RN: [name] Primary Care Provider: Iona Beard Other Clinician: Referring Provider: Iona Beard Treating Provider/Extender: Yaakov Guthrie in Treatment: 2 History of Present Illness HPI Description: 10/20/2020 upon evaluation today patient appears to be doing somewhat poorly in regard to her left leg where she has a significant wound although not very deep but this is definitely due to her chronic swelling. Fortunately there does not appear to be any signs of active infection at this time which is great news. There was a lot of scabbing going on and then she had areas that were somewhat open up underneath. It does appear to be very lymphedema like in nature. With that being said I do not see any signs of active infection currently. I do think that based on what we are seeing at the moment this is likely an area that will respond well to appropriate compression therapy. If it does not then we will need to reevaluate the situation for sure. The patient does have a history of diabetes mellitus type 2, chronic venous insufficiency, lymphedema, chronic kidney disease stage III, and congestive heart failure.  She is status post bariatric surgery as well. Overall I am pleased with where things stand I think she does need to definitely have compression in place. This wound has been present for about 4 to 6 weeks she tells me. 10/29/2020 upon evaluation today patient actually appears to be doing much better. She has been tolerating the dressing changes without complication and the compression wrap did slide down her  a bit but to be perfectly honest I am very pleased overall with well the wound appears today. I do not see any signs of active infection which is also great news. Nonetheless I think that we are definitely headed in the appropriate direction as far as healing is concerned and in fact this is significantly improved even as compared to just last week. 11/06/2020 upon evaluation today patient appears to be doing well with regard to her wounds. In fact everything appears to be completely healed which is great news. I am extremely pleased with where things stand today. No fevers, chills, nausea, vomiting, or diarrhea. 02/17/2021 [name] is a 56 year old female with a past medical history of insulin-dependent type 2 diabetes, CKD stage III, bariatric surgery and lymphedema that presents to the clinic because she would like to order lymphedema pumps. She was seen in our clinic in August and September for an open wound to her left lower extremity due to lymphedema. She was treated With 4-layer compression for 2-1/2 weeks with resolution of her wound. She has not developed wounds since her discharge in September. She has compression stockings but states she cannot put these on daily. Readmission 03/31/2021 Patient presents with an open wound today. She was last seen on 02/17/2021 for lymphedema pump order. She states that she is working with insurance to get these covered. She still has not received them. She also has not been using compression stockings daily. She states these are difficult to  put on. She developed a wound 2 weeks ago and has been keeping the area covered. She currently denies signs of infection. 2/8; patient presents for follow-up. She reports receiving lymphedema pumps however these were the wrong size. She states she is getting these replaced and sent in the mail soon. She did receive her juxta lite compression. She had no issues or complaints with the compression wrap this past week. She denies signs of infection. 2/15; patient presents for follow-up. She reports obtaining the correct size lymphedema pumps and started using them yesterday. She has no issues or complaints today. She denies signs of infection. Electronic Signature(s) Signed: 04/14/2021 9:43:17 AM By: Lori Shan DO Entered By: [name] on 04/14/2021 09:40:35 [name], [name] ([id number]) -------------------------------------------------------------------------------- Physical Exam Details Patient Name: [name] Date of Service: 04/14/2021 8:15 AM Medical Record Number: [id number] Patient Account Number: [id number] Date of Birth/Sex: [birth year] (55 y.o. F) Treating RN: [name] Primary Care Provider: Iona Beard Other Clinician: Referring Provider: Iona Beard Treating Provider/Extender: Yaakov Guthrie in Treatment: 2 Constitutional . Cardiovascular . Psychiatric . Notes Left lower extremity: To the distal medial aspect there is an open wound with granulation tissue And nonviable tissue. Good edema control. No signs of infection. Electronic Signature(s) Signed: 04/14/2021 9:43:17 AM By: Lori Shan DO Entered By: [name] on 04/14/2021 09:41:09 [name], [name] ([id number]) -------------------------------------------------------------------------------- Physician Orders Details Patient Name: [name] Date of Service: 04/14/2021 8:15 AM Medical Record Number: [id number] Patient Account Number: [id number] Date  of Birth/Sex: [birth year] (55 y.o. F) Treating RN: [name] Primary Care Provider: Iona Beard Other Clinician: Referring Provider: Iona Beard Treating Provider/Extender: Yaakov Guthrie in Treatment: 2 Verbal / Phone Orders: No Diagnosis Coding Discharge From [facility] Services o Moisturize legs daily after removing compression garments. o Elevate, Exercise Daily and Avoid Standing for Long Periods of Time. o DO YOUR BEST to sleep in the bed at night. DO NOT sleep in your recliner. Long hours of sitting in a recliner leads  to swelling of the legs and/or potential wounds on your backside. Bathing/ Shower/ Hygiene o May shower with wound dressing protected with water repellent cover or cast protector. o No tub bath. Edema Control - Lymphedema / Segmental Compressive Device / Other o Optional: One layer of unna paste to top of compression wrap (to act as an anchor). o Compression Pump: Use compression pump on left lower extremity for 60 minutes, twice daily. o Compression Pump: Use compression pump on right lower extremity for 60 minutes, twice daily. o DO YOUR BEST to sleep in the bed at night. DO NOT sleep in your recliner. Long hours of sitting in a recliner leads to swelling of the legs and/or potential wounds on your backside. Wound Treatment Wound #2 - Lower Leg Wound Laterality: Left, Medial Primary Dressing: Silvercel Small 2x2 (in/in) 1 x Per Week/30 Days Discharge Instructions: Apply Silvercel Small 2x2 (in/in) as instructed Secondary Dressing: ABD Pad 5x9 (in/in) 1 x Per Week/30 Days Discharge Instructions: Cover with ABD pad Compression Wrap: Medichoice 4 layer Compression System, 35-40 mmHG 1 x Per Week/30 Days Discharge Instructions: Apply multi-layer wrap as directed. Compression Stockings: Circaid Juxta Lite Compression Wrap Left Leg Compression Amount: 30-40 mmHG Discharge Instructions: Apply Circaid Juxta Lite Compression Wrap as  directed Electronic Signature(s) Signed: 04/14/2021 9:43:17 AM By: Lori Shan DO Entered By: [name] on 04/14/2021 09:42:35 [name], [name] ([id number]) -------------------------------------------------------------------------------- Problem List Details Patient Name: [name] Date of Service: 04/14/2021 8:15 AM Medical Record Number: [id number] Patient Account Number: [id number] Date of Birth/Sex: October 19, 1965 (55 y.o. F) Treating RN: [name] Primary Care Provider: Iona Beard Other Clinician: Referring Provider: Iona Beard Treating Provider/Extender: Yaakov Guthrie in Treatment: 2 Active Problems ICD-10 Encounter Code Description Active Date MDM Diagnosis [phone] Non-pressure chronic ulcer of other part of left lower leg with fat layer 03/31/2021 No Yes exposed I89.0 Lymphedema, not elsewhere classified 03/31/2021 No Yes I87.2 Venous insufficiency (chronic) (peripheral) 03/31/2021 No Yes E11.622 Type 2 diabetes mellitus with other skin ulcer 03/31/2021 No Yes N18.30 Chronic kidney disease, stage 3 unspecified 03/31/2021 No Yes I50.42 Chronic combined systolic (congestive) and diastolic (congestive) heart 03/31/2021 No Yes failure Z98.84 Bariatric surgery status 03/31/2021 No Yes Inactive Problems Resolved Problems Electronic Signature(s) Signed: 04/14/2021 9:43:17 AM By: Lori Shan DO Entered By: [name] on 04/14/2021 09:39:18 [name], [name] ([id number]) -------------------------------------------------------------------------------- Progress Note Details Patient Name: [name] Date of Service: 04/14/2021 8:15 AM Medical Record Number: [id number] Patient Account Number: [id number] Date of Birth/Sex: [birth year] (56 y.o. F) Treating RN: [name] Primary Care Provider: Iona Beard Other Clinician: Referring Provider: Iona Beard Treating Provider/Extender: Yaakov Guthrie in Treatment:  2 Subjective Chief Complaint Information obtained from Patient Evaluation for lymphedema pumps 03/31/2021; left lower extremity ulcer History of Present Illness (HPI) 10/20/2020 upon evaluation today patient appears to be doing somewhat poorly in regard to her left leg where she has a significant wound although not very deep but this is definitely due to her chronic swelling. Fortunately there does not appear to be any signs of active infection at this time which is great news. There was a lot of scabbing going on and then she had areas that were somewhat open up underneath. It does appear to be very lymphedema like in nature. With that being said I do not see any signs of active infection currently. I do think that based on what we are seeing at the moment this is likely an area that will respond well to appropriate  compression therapy. If it does not then we will need to reevaluate the situation for sure. The patient does have a history of diabetes mellitus type 2, chronic venous insufficiency, lymphedema, chronic kidney disease stage III, and congestive heart failure. She is status post bariatric surgery as well. Overall I am pleased with where things stand I think she does need to definitely have compression in place. This wound has been present for about 4 to 6 weeks she tells me. 10/29/2020 upon evaluation today patient actually appears to be doing much better. She has been tolerating the dressing changes without complication and the compression wrap did slide down her a bit but to be perfectly honest I am very pleased overall with well the wound appears today. I do not see any signs of active infection which is also great news. Nonetheless I think that we are definitely headed in the appropriate direction as far as healing is concerned and in fact this is significantly improved even as compared to just last week. 11/06/2020 upon evaluation today patient appears to be doing well with regard to her  wounds. In fact everything appears to be completely healed which is great news. I am extremely pleased with where things stand today. No fevers, chills, nausea, vomiting, or diarrhea. 02/17/2021 Ms. [name] is a 56 year old female with a past medical history of insulin-dependent type 2 diabetes, CKD stage III, bariatric surgery and lymphedema that presents to the clinic because she would like to order lymphedema pumps. She was seen in our clinic in August and September for an open wound to her left lower extremity due to lymphedema. She was treated With 4-layer compression for 2-1/2 weeks with resolution of her wound. She has not developed wounds since her discharge in September. She has compression stockings but states she cannot put these on daily. Readmission 03/31/2021 Patient presents with an open wound today. She was last seen on 02/17/2021 for lymphedema pump order. She states that she is working with insurance to get these covered. She still has not received them. She also has not been using compression stockings daily. She states these are difficult to put on. She developed a wound 2 weeks ago and has been keeping the area covered. She currently denies signs of infection. 2/8; patient presents for follow-up. She reports receiving lymphedema pumps however these were the wrong size. She states she is getting these replaced and sent in the mail soon. She did receive her juxta lite compression. She had no issues or complaints with the compression wrap this past week. She denies signs of infection. 2/15; patient presents for follow-up. She reports obtaining the correct size lymphedema pumps and started using them yesterday. She has no issues or complaints today. She denies signs of infection. Objective Constitutional Vitals Time Taken: 8:08 AM, Height: 60 in, Weight: 263 lbs, BMI: 51.4, Temperature: 97.6 F, Pulse: 75 bpm, Respiratory Rate: 18 breaths/min, Blood Pressure: 108/64  mmHg. General Notes: Left lower extremity: To the distal medial aspect there is an open wound with granulation tissue And nonviable tissue. Good edema control. No signs of infection. Integumentary (Hair, Skin) [name], Lori R. ([id number]) Wound #2 status is Open. Original cause of wound was Gradually Appeared. The date acquired was: 03/18/2021. The wound has been in treatment 2 weeks. The wound is located on the Left,Medial Lower Leg. The wound measures 1cm length x 1.5cm width x 0.1cm depth; 1.178cm^2 area and 0.118cm^3 volume. There is Fat Layer (Subcutaneous Tissue) exposed. There is no tunneling or undermining  noted. There is a medium amount of serosanguineous drainage noted. There is large (67-100%) red granulation within the wound bed. There is a small (1-33%) amount of necrotic tissue within the wound bed including Adherent Slough. Assessment Active Problems ICD-10 Non-pressure chronic ulcer of other part of left lower leg with fat layer exposed Lymphedema, not elsewhere classified Venous insufficiency (chronic) (peripheral) Type 2 diabetes mellitus with other skin ulcer Chronic kidney disease, stage 3 unspecified Chronic combined systolic (congestive) and diastolic (congestive) heart failure Bariatric surgery status Patient's mentation improvement in size and appearance since last clinic visit. I debrided nonviable tissue. No signs of infection on exam. I recommended continuing with silver alginate under 4-layer compression. Follow-up in 1 week. Procedures Wound #2 Pre-procedure diagnosis of Wound #2 is a Diabetic Wound/Ulcer of the Lower Extremity located on the Left,Medial Lower Leg .Severity of Tissue Pre Debridement is: Fat layer exposed. There was a Excisional Skin/Subcutaneous Tissue Debridement with a total area of 1.5 sq cm performed by Lori Shan, MD. With the following instrument(s): Curette to remove Viable and Non-Viable tissue/material. Material removed  includes Subcutaneous Tissue and Slough and. No specimens were taken. A time out was conducted at 08:49, prior to the start of the procedure. A Minimum amount of bleeding was controlled with Pressure. The procedure was tolerated well. Post Debridement Measurements: 1cm length x 1.5cm width x 0.1cm depth; 0.118cm^3 volume. Character of Wound/Ulcer Post Debridement is stable. Severity of Tissue Post Debridement is: Fat layer exposed. Post procedure Diagnosis Wound #2: Same as Pre-Procedure Plan Discharge From [facility] Services: Moisturize legs daily after removing compression garments. Elevate, Exercise Daily and Avoid Standing for Long Periods of Time. DO YOUR BEST to sleep in the bed at night. DO NOT sleep in your recliner. Long hours of sitting in a recliner leads to swelling of the legs and/or potential wounds on your backside. Bathing/ Shower/ Hygiene: May shower with wound dressing protected with water repellent cover or cast protector. No tub bath. Edema Control - Lymphedema / Segmental Compressive Device / Other: Optional: One layer of unna paste to top of compression wrap (to act as an anchor). Compression Pump: Use compression pump on left lower extremity for 60 minutes, twice daily. Compression Pump: Use compression pump on right lower extremity for 60 minutes, twice daily. DO YOUR BEST to sleep in the bed at night. DO NOT sleep in your recliner. Long hours of sitting in a recliner leads to swelling of the legs and/or potential wounds on your backside. WOUND #2: - Lower Leg Wound Laterality: Left, Medial Primary Dressing: Silvercel Small 2x2 (in/in) 1 x Per Week/30 Days Discharge Instructions: Apply Silvercel Small 2x2 (in/in) as instructed Secondary Dressing: ABD Pad 5x9 (in/in) 1 x Per Week/30 Days Discharge Instructions: Cover with ABD pad Compression Wrap: Medichoice 4 layer Compression System, 35-40 mmHG 1 x Per Week/30 Days Discharge Instructions: Apply multi-layer wrap as  directed. Compression Stockings: Circaid Juxta Lite Compression Wrap Compression Amount: 30-40 mmHg (left) Discharge Instructions: Apply Circaid Juxta Lite Compression Wrap as directed [name], Lori R. ([id number]) 1. In office sharp debridement 2. Silver alginate under 4-layer compression 3. Follow-up in 1 week Electronic Signature(s) Signed: 04/14/2021 9:43:17 AM By: Lori Shan DO Entered By: [name] on 04/14/2021 09:41:53 [name], [name] ([id number]) -------------------------------------------------------------------------------- SuperBill Details Patient Name: [name] Date of Service: 04/14/2021 Medical Record Number: [id number] Patient Account Number: [id number] Date of Birth/Sex: Mar 23, 1965 (55 y.o. F) Treating RN: [name] Primary Care Provider: Iona Beard Other Clinician: Referring Provider: Berdine Addison,  Berneta Sages Treating Provider/Extender: Yaakov Guthrie in Treatment: 2 Diagnosis Coding ICD-10 Codes Code Description [phone] Non-pressure chronic ulcer of other part of left lower leg with fat layer exposed I89.0 Lymphedema, not elsewhere classified I87.2 Venous insufficiency (chronic) (peripheral) E11.622 Type 2 diabetes mellitus with other skin ulcer N18.30 Chronic kidney disease, stage 3 unspecified 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.84 Bariatric surgery status Facility Procedures CPT4 Code: [id number] Description: 10626 - DEB SUBQ TISSUE 20 SQ CM/< Modifier: Quantity: 1 CPT4 Code: Description: ICD-10 Diagnosis Description L97.822 Non-pressure chronic ulcer of other part of left lower leg with fat layer E11.622 Type 2 diabetes mellitus with other skin ulcer Modifier: exposed Quantity: Physician Procedures CPT4 Code: [id number] Description: 11042 - WC PHYS SUBQ TISS 20 SQ CM Modifier: Quantity: 1 CPT4 Code: Description: ICD-10 Diagnosis Description L97.822  Non-pressure chronic ulcer of other part of left lower leg with fat layer E11.622 Type 2 diabetes mellitus with other skin ulcer Modifier: exposed Quantity: Electronic Signature(s) Signed: 04/14/2021 9:43:17 AM By: Lori Shan DO Entered By: [name] on 04/14/2021 09:42:14

## 2021-04-19 DIAGNOSIS — Z794 Long term (current) use of insulin: Secondary | ICD-10-CM | POA: Diagnosis not present

## 2021-04-19 DIAGNOSIS — J45998 Other asthma: Secondary | ICD-10-CM | POA: Diagnosis not present

## 2021-04-19 DIAGNOSIS — E108 Type 1 diabetes mellitus with unspecified complications: Secondary | ICD-10-CM | POA: Diagnosis not present

## 2021-04-21 ENCOUNTER — Other Ambulatory Visit: Payer: Self-pay

## 2021-04-21 ENCOUNTER — Encounter (HOSPITAL_BASED_OUTPATIENT_CLINIC_OR_DEPARTMENT_OTHER): Payer: Medicare Other | Admitting: Internal Medicine

## 2021-04-21 DIAGNOSIS — E11622 Type 2 diabetes mellitus with other skin ulcer: Secondary | ICD-10-CM

## 2021-04-21 DIAGNOSIS — I872 Venous insufficiency (chronic) (peripheral): Secondary | ICD-10-CM

## 2021-04-21 DIAGNOSIS — I890 Lymphedema, not elsewhere classified: Secondary | ICD-10-CM

## 2021-04-21 DIAGNOSIS — N183 Chronic kidney disease, stage 3 unspecified: Secondary | ICD-10-CM | POA: Diagnosis not present

## 2021-04-21 DIAGNOSIS — L97822 Non-pressure chronic ulcer of other part of left lower leg with fat layer exposed: Secondary | ICD-10-CM

## 2021-04-21 DIAGNOSIS — Z9884 Bariatric surgery status: Secondary | ICD-10-CM | POA: Diagnosis not present

## 2021-04-21 DIAGNOSIS — I5042 Chronic combined systolic (congestive) and diastolic (congestive) heart failure: Secondary | ICD-10-CM | POA: Diagnosis not present

## 2021-04-21 DIAGNOSIS — I130 Hypertensive heart and chronic kidney disease with heart failure and stage 1 through stage 4 chronic kidney disease, or unspecified chronic kidney disease: Secondary | ICD-10-CM | POA: Diagnosis not present

## 2021-04-21 NOTE — Progress Notes (Signed)
[name] ([id number]) Visit Report for 04/21/2021 Chief Complaint Document Details Patient Name: [name], [name] Date of Service: 04/21/2021 8:15 AM Medical Record Number: [id number] Patient Account Number: [id number] Date of Birth/Sex: 1965/05/05 (56 y.o. F) Treating RN: Levora Dredge Primary Care Provider: Iona Beard Other Clinician: Referring Provider: Iona Beard Treating Provider/Extender: Yaakov Guthrie in Treatment: 3 Information Obtained from: Patient Chief Complaint Evaluation for lymphedema pumps 03/31/2021; left lower extremity ulcer Electronic Signature(s) Signed: 04/21/2021 8:53:27 AM By: Kalman Shan DO Entered By: Kalman Shan on 04/21/2021 08:50:06 Borman, [name] ([id number]) -------------------------------------------------------------------------------- HPI Details Patient Name: [name] Date of Service: 04/21/2021 8:15 AM Medical Record Number: [id number] Patient Account Number: [id number] Date of Birth/Sex: 15-Jun-1965 (55 y.o. F) Treating RN: Levora Dredge Primary Care Provider: Iona Beard Other Clinician: Referring Provider: Iona Beard Treating Provider/Extender: Yaakov Guthrie in Treatment: 3 History of Present Illness HPI Description: 10/20/2020 upon evaluation today patient appears to be doing somewhat poorly in regard to her left leg where she has a significant wound although not very deep but this is definitely due to her chronic swelling. Fortunately there does not appear to be any signs of active infection at this time which is great news. There was a lot of scabbing going on and then she had areas that were somewhat open up underneath. It does appear to be very lymphedema like in nature. With that being said I do not see any signs of active infection currently. I do think that based on what we are seeing at the moment this is likely an area that will respond well to appropriate  compression therapy. If it does not then we will need to reevaluate the situation for sure. The patient does have a history of diabetes mellitus type 2, chronic venous insufficiency, lymphedema, chronic kidney disease stage III, and congestive heart failure. She is status post bariatric surgery as well. Overall I am pleased with where things stand I think she does need to definitely have compression in place. This wound has been present for about 4 to 6 weeks she tells me. 10/29/2020 upon evaluation today patient actually appears to be doing much better. She has been tolerating the dressing changes without complication and the compression wrap did slide down her a bit but to be perfectly honest I am very pleased overall with well the wound appears today. I do not see any signs of active infection which is also great news. Nonetheless I think that we are definitely headed in the appropriate direction as far as healing is concerned and in fact this is significantly improved even as compared to just last week. 11/06/2020 upon evaluation today patient appears to be doing well with regard to her wounds. In fact everything appears to be completely healed which is great news. I am extremely pleased with where things stand today. No fevers, chills, nausea, vomiting, or diarrhea. 02/17/2021 Ms. [name] is a 56 year old female with a past medical history of insulin-dependent type 2 diabetes, CKD stage III, bariatric surgery and lymphedema that presents to the clinic because she would like to order lymphedema pumps. She was seen in our clinic in August and September for an open wound to her left lower extremity due to lymphedema. She was treated With 4-layer compression for 2-1/2 weeks with resolution of her wound. She has not developed wounds since her discharge in September. She has compression stockings but states she cannot put these on daily. Readmission 03/31/2021 Patient presents with an open  wound  today. She was last seen on 02/17/2021 for lymphedema pump order. She states that she is working with insurance to get these covered. She still has not received them. She also has not been using compression stockings daily. She states these are difficult to put on. She developed a wound 2 weeks ago and has been keeping the area covered. She currently denies signs of infection. 2/8; patient presents for follow-up. She reports receiving lymphedema pumps however these were the wrong size. She states she is getting these replaced and sent in the mail soon. She did receive her juxta lite compression. She had no issues or complaints with the compression wrap this past week. She denies signs of infection. 2/15; patient presents for follow-up. She reports obtaining the correct size lymphedema pumps and started using them yesterday. She has no issues or complaints today. She denies signs of infection. 2/22; patient presents for follow-up. She has no issues or complaints today. Electronic Signature(s) Signed: 04/21/2021 8:53:27 AM By: Kalman Shan DO Entered By: Kalman Shan on 04/21/2021 08:50:27 Dulude, [name] ([id number]) -------------------------------------------------------------------------------- Physical Exam Details Patient Name: [name] Date of Service: 04/21/2021 8:15 AM Medical Record Number: [id number] Patient Account Number: [id number] Date of Birth/Sex: Oct 16, 1965 (55 y.o. F) Treating RN: Levora Dredge Primary Care Provider: Iona Beard Other Clinician: Referring Provider: Iona Beard Treating Provider/Extender: Yaakov Guthrie in Treatment: 3 Constitutional . Psychiatric . Notes Left lower extremity: To the distal medial aspect there is a small pinpoint open wound. No signs of surrounding infection. Good edema control. Electronic Signature(s) Signed: 04/21/2021 8:53:27 AM By: Kalman Shan DO Entered By: Kalman Shan on  04/21/2021 08:50:57 Pribyl, [name] ([id number]) -------------------------------------------------------------------------------- Physician Orders Details Patient Name: [name] Date of Service: 04/21/2021 8:15 AM Medical Record Number: [id number] Patient Account Number: [id number] Date of Birth/Sex: 1965/05/15 (55 y.o. F) Treating RN: Levora Dredge Primary Care Provider: Iona Beard Other Clinician: Referring Provider: Iona Beard Treating Provider/Extender: Yaakov Guthrie in Treatment: 3 Verbal / Phone Orders: No Diagnosis Coding Discharge From [facility] o Moisturize legs daily after removing compression garments. o Elevate, Exercise Daily and Avoid Standing for Long Periods of Time. o DO YOUR BEST to sleep in the bed at night. DO NOT sleep in your recliner. Long hours of sitting in a recliner leads to swelling of the legs and/or potential wounds on your backside. Bathing/ Shower/ Hygiene o May shower with wound dressing protected with water repellent cover or cast protector. o No tub bath. Edema Control - Lymphedema / Segmental Compressive Device / Other o Optional: One layer of unna paste to top of compression wrap (to act as an anchor). o Compression Pump: Use compression pump on left lower extremity for 60 minutes, twice daily. o Compression Pump: Use compression pump on right lower extremity for 60 minutes, twice daily. o DO YOUR BEST to sleep in the bed at night. DO NOT sleep in your recliner. Long hours of sitting in a recliner leads to swelling of the legs and/or potential wounds on your backside. Wound Treatment Wound #2 - Lower Leg Wound Laterality: Left, Medial Primary Dressing: Silvercel Small 2x2 (in/in) 1 x Per Week/30 Days Discharge Instructions: Apply Silvercel Small 2x2 (in/in) as instructed Secondary Dressing: ABD Pad 5x9 (in/in) 1 x Per Week/30 Days Discharge Instructions: Cover with ABD pad Compression Wrap:  Medichoice 4 layer Compression System, 35-40 mmHG 1 x Per Week/30 Days Discharge Instructions: Apply multi-layer wrap as directed. Compression Stockings: Circaid Juxta Lite Compression Wrap Left Leg Compression  Amount: 30-40 mmHG Discharge Instructions: Apply Circaid Juxta Lite Compression Wrap as directed Electronic Signature(s) Signed: 04/21/2021 8:53:27 AM By: Kalman Shan DO Entered By: Kalman Shan on 04/21/2021 08:52:58 Hofmeister, [name] ([id number]) -------------------------------------------------------------------------------- Problem List Details Patient Name: [name] Date of Service: 04/21/2021 8:15 AM Medical Record Number: [id number] Patient Account Number: [id number] Date of Birth/Sex: 1965-12-04 (55 y.o. 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/31/2021 No Yes exposed I89.0 Lymphedema, not elsewhere classified 03/31/2021 No Yes I87.2 Venous insufficiency (chronic) (peripheral) 03/31/2021 No Yes E11.622 Type 2 diabetes mellitus with other skin ulcer 03/31/2021 No Yes N18.30 Chronic kidney disease, stage 3 unspecified 03/31/2021 No Yes I50.42 Chronic combined systolic (congestive) and diastolic (congestive) heart 03/31/2021 No Yes failure Z98.84 Bariatric surgery status 03/31/2021 No Yes Inactive Problems Resolved Problems Electronic Signature(s) Signed: 04/21/2021 8:53:27 AM By: Kalman Shan DO Entered By: Kalman Shan on 04/21/2021 08:50:01 Prater, [name] ([id number]) -------------------------------------------------------------------------------- Progress Note Details Patient Name: [name] Date of Service: 04/21/2021 8:15  AM Medical Record Number: [id number] Patient Account Number: [id number] Date of Birth/Sex: Mar 31, 1965 (55 y.o. F) Treating RN: Levora Dredge Primary Care Provider: Iona Beard Other Clinician: Referring Provider: Iona Beard Treating Provider/Extender: Yaakov Guthrie in Treatment: 3 Subjective Chief Complaint Information obtained from Patient Evaluation for lymphedema pumps 03/31/2021; left lower extremity ulcer History of Present Illness (HPI) 10/20/2020 upon evaluation today patient appears to be doing somewhat poorly in regard to her left leg where she has a significant wound although not very deep but this is definitely due to her chronic swelling. Fortunately there does not appear to be any signs of active infection at this time which is great news. There was a lot of scabbing going on and then she had areas that were somewhat open up underneath. It does appear to be very lymphedema like in nature. With that being said I do not see any signs of active infection currently. I do think that based on what we are seeing at the moment this is likely an area that will respond well to appropriate compression therapy. If it does not then we will need to reevaluate the situation for sure. The patient does have a history of diabetes mellitus type 2, chronic venous insufficiency, lymphedema, chronic kidney disease stage III, and congestive heart failure. She is status post bariatric surgery as well. Overall I am pleased with where things stand I think she does need to definitely have compression in place. This wound has been present for about 4 to 6 weeks she tells me. 10/29/2020 upon evaluation today patient actually appears to be doing much better. She has been tolerating the dressing changes without complication and the compression wrap did slide down her a bit but to be perfectly honest I am very pleased overall with well the wound appears today. I do not see any signs of active infection  which is also great news. Nonetheless I think that we are definitely headed in the appropriate direction as far as healing is concerned and in fact this is significantly improved even as compared to just last week. 11/06/2020 upon evaluation today patient appears to be doing well with regard to her wounds. In fact everything appears to be completely healed which is great news. I am extremely  pleased with where things stand today. No fevers, chills, nausea, vomiting, or diarrhea. 02/17/2021 Ms. Teagyn Fishel is a 56 year old female with a past medical history of insulin-dependent type 2 diabetes, CKD stage III, bariatric surgery and lymphedema that presents to the clinic because she would like to order lymphedema pumps. She was seen in our clinic in August and September for an open wound to her left lower extremity due to lymphedema. She was treated With 4-layer compression for 2-1/2 weeks with resolution of her wound. She has not developed wounds since her discharge in September. She has compression stockings but states she cannot put these on daily. Readmission 03/31/2021 Patient presents with an open wound today. She was last seen on 02/17/2021 for lymphedema pump order. She states that she is working with insurance to get these covered. She still has not received them. She also has not been using compression stockings daily. She states these are difficult to put on. She developed a wound 2 weeks ago and has been keeping the area covered. She currently denies signs of infection. 2/8; patient presents for follow-up. She reports receiving lymphedema pumps however these were the wrong size. She states she is getting these replaced and sent in the mail soon. She did receive her juxta lite compression. She had no issues or complaints with the compression wrap this past week. She denies signs of infection. 2/15; patient presents for follow-up. She reports obtaining the correct size lymphedema pumps and  started using them yesterday. She has no issues or complaints today. She denies signs of infection. 2/22; patient presents for follow-up. She has no issues or complaints today. Objective Constitutional Vitals Time Taken: 8:12 AM, Height: 60 in, Weight: 263 lbs, BMI: 51.4, Temperature: 97.9 F, Pulse: 51 bpm, Respiratory Rate: 18 breaths/min, Blood Pressure: 107/52 mmHg. General Notes: Left lower extremity: To the distal medial aspect there is a small pinpoint open wound. No signs of surrounding infection. Good edema control. Spang, Melady RMarland Kitchen ([id number]) Integumentary (Hair, Skin) Wound #2 status is Open. Original cause of wound was Gradually Appeared. The date acquired was: 03/18/2021. The wound has been in treatment 3 weeks. The wound is located on the Left,Medial Lower Leg. The wound measures 0.1cm length x 0.1cm width x 0.1cm depth; 0.008cm^2 area and 0.001cm^3 volume. There is no tunneling or undermining noted. There is a medium amount of serosanguineous drainage noted. There is large (67-100%) red granulation within the wound bed. There is no necrotic tissue within the wound bed. Assessment Active Problems ICD-10 Non-pressure chronic ulcer of other part of left lower leg with fat layer exposed Lymphedema, not elsewhere classified Venous insufficiency (chronic) (peripheral) Type 2 diabetes mellitus with other skin ulcer Chronic kidney disease, stage 3 unspecified Chronic combined systolic (congestive) and diastolic (congestive) heart failure Bariatric surgery status Patient has done well with silver alginate under 4-layer compression. She has a very small open wound still present. I suspect this will be closed at next clinic visit and I recommended she bring in her juxta lite compression at that time. We will continue with current therapy of silver alginate and 4-layer compression I recommended continuing lymphedema pumps daily. Plan 1. Silver alginate under 4-layer  compression 2. Follow-up in 1 week Electronic Signature(s) Signed: 04/21/2021 8:53:27 AM By: Kalman Shan DO Entered By: Kalman Shan on 04/21/2021 08:52:15 Eisel, [name] ([id number]) -------------------------------------------------------------------------------- SuperBill Details Patient Name: [name] Date of Service: 04/21/2021 Medical Record Number: [id number] Patient Account Number: [id number] Date of Birth/Sex: 05-17-1965 (55 y.o. F) Treating  RN: Levora Dredge Primary Care Provider: Iona Beard Other Clinician: Referring Provider: Iona Beard Treating Provider/Extender: Yaakov Guthrie in Treatment: 3 Diagnosis Coding ICD-10 Codes Code Description [id number] Non-pressure chronic ulcer of other part of left lower leg with fat layer exposed I89.0 Lymphedema, not elsewhere classified I87.2 Venous insufficiency (chronic) (peripheral) E11.622 Type 2 diabetes mellitus with other skin ulcer N18.30 Chronic kidney disease, stage 3 unspecified 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.84 Bariatric surgery status Facility Procedures CPT4 Code: 09811914 Description: (Facility Use Only) [phone] - [facility] LT LEG Modifier: Quantity: 1 Physician Procedures CPT4 Code: 1308657 Description: 99213 - WC PHYS LEVEL 3 - EST PT Modifier: Quantity: 1 CPT4 Code: Description: ICD-10 Diagnosis Description L97.822 Non-pressure chronic ulcer of other part of left lower leg with fat lay I89.0 Lymphedema, not elsewhere classified I87.2 Venous insufficiency (chronic) (peripheral) E11.622 Type 2 diabetes mellitus with  other skin ulcer Modifier: er exposed Quantity: Electronic Signature(s) Signed: 04/21/2021 9:03:43 AM By: Kalman Shan DO Previous Signature: 04/21/2021 8:53:27 AM Version By: Kalman Shan DO Entered By: Levora Dredge on 04/21/2021 08:53:16

## 2021-04-21 NOTE — Progress Notes (Signed)
Lori, [name] ([id number]) Visit Report for 04/21/2021 Arrival Information Details Patient Name: [name], [name] Date of Service: 04/21/2021 8:15 AM Medical Record Number: [id number] Patient Account Number: [id number] Date of Birth/Sex: [birth year] (56 y.o. F) Treating RN: Levora Dredge Primary Care Jamica Woodyard: Iona Beard Other Clinician: Referring Braydon Kullman: Iona Beard Treating Yasamin Karel/Extender: Yaakov Guthrie in Treatment: 3 Visit Information History Since Last Visit Added or deleted any medications: No Patient Arrived: Ambulatory Any new allergies or adverse reactions: No Arrival Time: 08:10 Had a fall or experienced change in No Accompanied By: self activities of daily living that may affect Transfer Assistance: None risk of falls: Patient Identification Verified: Yes Hospitalized since last visit: No Secondary Verification Process Completed: Yes Has Dressing in Place as Prescribed: Yes Patient Requires Transmission-Based No Has Compression in Place as Prescribed: Yes Precautions: Pain Present Now: No Patient Has Alerts: Yes Patient Alerts: DIABETIC R ABI 1.39/.80 03/10/21 L ABI 1.29/.74 03/10/21 Electronic Signature(s) Signed: 04/21/2021 4:28:37 PM By: Levora Dredge Entered By: Levora Dredge on 04/21/2021 08:12:45 Cariker, [name] ([id number]) -------------------------------------------------------------------------------- Clinic Level of Care Assessment Details Patient Name: [name] Date of Service: 04/21/2021 8:15 AM Medical Record Number: [id number] Patient Account Number: [id number] Date of Birth/Sex: 10/21/1965 (56 y.o. F) Treating RN: Levora Dredge Primary Care Akaya Proffit: Iona Beard Other Clinician: Referring Jacyln Carmer: Iona Beard Treating Laurice Kimmons/Extender: Yaakov Guthrie in Treatment: 3 Clinic Level of Care Assessment Items TOOL 1 Quantity Score []  - Use when EandM and Procedure is performed on  INITIAL visit 0 ASSESSMENTS - Nursing Assessment / Reassessment []  - General Physical Exam (combine w/ comprehensive assessment (listed just below) when performed on new 0 pt. evals) []  - 0 Comprehensive Assessment (HX, ROS, Risk Assessments, Wounds Hx, etc.) ASSESSMENTS - Wound and Skin Assessment / Reassessment []  - Dermatologic / Skin Assessment (not related to wound area) 0 ASSESSMENTS - Ostomy and/or Continence Assessment and Care []  - Incontinence Assessment and Management 0 []  - 0 Ostomy Care Assessment and Management (repouching, etc.) PROCESS - Coordination of Care []  - Simple Patient / Family Education for ongoing care 0 []  - 0 Complex (extensive) Patient / Family Education for ongoing care []  - 0 Staff obtains Programmer, systems, Records, Test Results / Process Orders []  - 0 Staff telephones HHA, Nursing Homes / Clarify orders / etc []  - 0 Routine Transfer to another Facility (non-emergent condition) []  - 0 Routine Hospital Admission (non-emergent condition) []  - 0 New Admissions / Biomedical engineer / Ordering NPWT, Apligraf, etc. []  - 0 Emergency Hospital Admission (emergent condition) PROCESS - Special Needs []  - Pediatric / Minor Patient Management 0 []  - 0 Isolation Patient Management []  - 0 Hearing / Language / Visual special needs []  - 0 Assessment of Community assistance (transportation, D/C planning, etc.) []  - 0 Additional assistance / Altered mentation []  - 0 Support Surface(s) Assessment (bed, cushion, seat, etc.) INTERVENTIONS - Miscellaneous []  - External ear exam 0 []  - 0 Patient Transfer (multiple staff / Civil Service fast streamer / Similar devices) []  - 0 Simple Staple / Suture removal (25 or less) []  - 0 Complex Staple / Suture removal (26 or more) []  - 0 Hypo/Hyperglycemic Management (do not check if billed separately) []  - 0 Ankle / Brachial Index (ABI) - do not check if billed separately Has the patient been seen at the hospital within the last three  years: Yes Total Score: 0 Level Of Care: ____ [name] ([id number]) Electronic Signature(s) Signed: 04/21/2021 4:28:37 PM By: Levora Dredge Entered By: Araceli Bouche,  Caitlin on 04/21/2021 08:53:02 CIRIA, BERNARDINI ([id number]) -------------------------------------------------------------------------------- Compression Therapy Details Patient Name: Lori, [name] Date of Service: 04/21/2021 8:15 AM Medical Record Number: [id number] Patient Account Number: [id number] Date of Birth/Sex: April 03, 1965 (56 y.o. F) Treating RN: Levora Dredge Primary Care Dishon Kehoe: Iona Beard Other Clinician: Referring Jakaylee Sasaki: Iona Beard Treating Nava Song/Extender: Yaakov Guthrie in Treatment: 3 Compression Therapy Performed for Wound Assessment: Wound #2 Left,Medial Lower Leg Performed By: Clinician Levora Dredge, RN Compression Type: Four Layer Post Procedure Diagnosis Same as Pre-procedure Electronic Signature(s) Signed: 04/21/2021 4:28:37 PM By: Levora Dredge Entered By: Levora Dredge on 04/21/2021 08:52:31 Koppen, [name] ([id number]) -------------------------------------------------------------------------------- Encounter Discharge Information Details Patient Name: [name] Date of Service: 04/21/2021 8:15 AM Medical Record Number: [id number] Patient Account Number: [id number] Date of Birth/Sex: 29-Oct-1965 (56 y.o. F) Treating RN: Levora Dredge Primary Care Campbell Kray: Iona Beard Other Clinician: Referring Donnis Phaneuf: Iona Beard Treating Korrine Sicard/Extender: Yaakov Guthrie in Treatment: 3 Encounter Discharge Information Items Discharge Condition: Stable Ambulatory Status: Ambulatory Discharge Destination: Home Transportation: Private Auto Accompanied By: self Schedule Follow-up Appointment: Yes Clinical Summary of Care: Electronic Signature(s) Signed: 04/21/2021 4:28:37 PM By: Levora Dredge Entered By: Levora Dredge on  04/21/2021 08:54:05 Vidaurri, [name] ([id number]) -------------------------------------------------------------------------------- Lower Extremity Assessment Details Patient Name: [name] Date of Service: 04/21/2021 8:15 AM Medical Record Number: [id number] Patient Account Number: [id number] Date of Birth/Sex: 02/24/1966 (55 y.o. F) Treating RN: Levora Dredge Primary Care Lashika Erker: Iona Beard Other Clinician: Referring Ailynn Gow: Iona Beard Treating Slyvester Latona/Extender: Yaakov Guthrie in Treatment: 3 Edema Assessment Assessed: [Left: No] [Right: No] Edema: [Left: Ye] [Right: s] Calf Left: Right: Point of Measurement: 30 cm From Medial Instep 49.63 cm Ankle Left: Right: Point of Measurement: 10 cm From Medial Instep 28.5 cm Vascular Assessment Pulses: Dorsalis Pedis Palpable: [Left:Yes] Posterior Tibial Palpable: [Left:Yes] Electronic Signature(s) Signed: 04/21/2021 4:28:37 PM By: Levora Dredge Entered By: Levora Dredge on 04/21/2021 08:25:37 Armstrong, [name] ([id number]) -------------------------------------------------------------------------------- Multi Wound Chart Details Patient Name: [name] Date of Service: 04/21/2021 8:15 AM Medical Record Number: [id number] Patient Account Number: [id number] Date of Birth/Sex: 11/28/1965 (55 y.o. F) Treating RN: Levora Dredge Primary Care Kalika Smay: Iona Beard Other Clinician: Referring Aleese Kamps: Iona Beard Treating Yamilett Anastos/Extender: Yaakov Guthrie in Treatment: 3 Vital Signs Height(in): 60 Pulse(bpm): 51 Weight(lbs): 585 Blood Pressure(mmHg): 107/52 Body Mass Index(BMI): 51.4 Temperature(F): 97.9 Respiratory Rate(breaths/min): 18 Photos: [N/A:N/A] Wound Location: Left, Medial Lower Leg N/A N/A Wounding Event: Gradually Appeared N/A N/A Primary Etiology: Diabetic Wound/Ulcer of the Lower N/A N/A Extremity Comorbid History: Cataracts, Lymphedema, Asthma,  N/A N/A Congestive Heart Failure, Hypertension, Type II Diabetes, Osteoarthritis, Neuropathy Date Acquired: 03/18/2021 N/A N/A Weeks of Treatment: 3 N/A N/A Wound Status: Open N/A N/A Wound Recurrence: No N/A N/A Measurements L x W x D (cm) 0.1x0.1x0.1 N/A N/A Area (cm) : 0.008 N/A N/A Volume (cm) : 0.001 N/A N/A % Reduction in Area: 99.70% N/A N/A % Reduction in Volume: 99.60% N/A N/A Classification: Grade 2 N/A N/A Exudate Amount: Medium N/A N/A Exudate Type: Serosanguineous N/A N/A Exudate Color: red, brown N/A N/A Granulation Amount: Large (67-100%) N/A N/A Granulation Quality: Red N/A N/A Necrotic Amount: None Present (0%) N/A N/A Exposed Structures: Fascia: No N/A N/A Fat Layer (Subcutaneous Tissue): No Tendon: No Muscle: No Joint: No Bone: No Epithelialization: Large (67-100%) N/A N/A Treatment Notes Electronic Signature(s) Signed: 04/21/2021 4:28:37 PM By: Levora Dredge Entered By: Levora Dredge on 04/21/2021 08:38:24 Crisanto, [name] ([id number]) -------------------------------------------------------------------------------- Multi-Disciplinary Care Plan Details Patient Name: Teressa Lower,  [facility] R. Date of Service: 04/21/2021 8:15 AM Medical Record Number: [id number] Patient Account Number: [id number] Date of Birth/Sex: 12-31-1965 (56 y.o. F) Treating RN: Levora Dredge Primary Care Noemie Devivo: Iona Beard Other Clinician: Referring Lenni Reckner: Iona Beard Treating Abbey Veith/Extender: Yaakov Guthrie in Treatment: 3 Active Inactive Electronic Signature(s) Signed: 04/21/2021 4:28:37 PM By: Levora Dredge Entered By: Levora Dredge on 04/21/2021 08:38:13 Tadros, [name] ([id number]) -------------------------------------------------------------------------------- Pain Assessment Details Patient Name: [name] Date of Service: 04/21/2021 8:15 AM Medical Record Number: [id number] Patient Account Number: [id number] Date of  Birth/Sex: [birth year] (56 y.o. F) Treating RN: Levora Dredge Primary Care Vontrell Pullman: Iona Beard Other Clinician: Referring Einer Meals: Iona Beard Treating Cumi Sanagustin/Extender: Yaakov Guthrie in Treatment: 3 Active Problems Location of Pain Severity and Description of Pain Patient Has Paino No Site Locations Rate the pain. Current Pain Level: 0 Pain Management and Medication Current Pain Management: Electronic Signature(s) Signed: 04/21/2021 4:28:37 PM By: Levora Dredge Entered By: Levora Dredge on 04/21/2021 08:15:17 Crispen, [name] ([id number]) -------------------------------------------------------------------------------- Patient/Caregiver Education Details Patient Name: [name] Date of Service: 04/21/2021 8:15 AM Medical Record Number: [id number] Patient Account Number: [id number] Date of Birth/Gender: [birth year] (56 y.o. F) Treating RN: Levora Dredge Primary Care Physician: Iona Beard Other Clinician: Referring Physician: Iona Beard Treating Physician/Extender: Yaakov Guthrie in Treatment: 3 Education Assessment Education Provided To: Patient Education Topics Provided Wound/Skin Impairment: Handouts: Caring for Your Ulcer Methods: Explain/Verbal Responses: State content correctly Electronic Signature(s) Signed: 04/21/2021 4:28:37 PM By: Levora Dredge Entered By: Levora Dredge on 04/21/2021 08:53:27 Villamar, [name] ([id number]) -------------------------------------------------------------------------------- Wound Assessment Details Patient Name: [name] Date of Service: 04/21/2021 8:15 AM Medical Record Number: [id number] Patient Account Number: [id number] Date of Birth/Sex: [birth year] (56 y.o. F) Treating RN: Levora Dredge Primary Care Kolbee Bogusz: Iona Beard Other Clinician: Referring Johnchristopher Sarvis: Iona Beard Treating Nicolo Tomko/Extender: Yaakov Guthrie in Treatment: 3 Wound Status Wound  Number: 2 Primary Diabetic Wound/Ulcer of the Lower Extremity Etiology: Wound Location: Left, Medial Lower Leg Wound Open Wounding Event: Gradually Appeared Status: Date Acquired: 03/18/2021 Comorbid Cataracts, Lymphedema, Asthma, Congestive Heart Weeks Of Treatment: 3 History: Failure, Hypertension, Type II Diabetes, Osteoarthritis, Clustered Wound: No Neuropathy Photos Wound Measurements Length: (cm) 0.1 % Reduct Width: (cm) 0.1 % Reduct Depth: (cm) 0.1 Epitheli Area: (cm) 0.008 Tunneli Volume: (cm) 0.001 Undermi ion in Area: 99.7% ion in Volume: 99.6% alization: Large (67-100%) ng: No ning: No Wound Description Classification: Grade 2 Foul Odo Exudate Amount: Medium Slough/F Exudate Type: Serosanguineous Exudate Color: red, brown r After Cleansing: No ibrino No Wound Bed Granulation Amount: Large (67-100%) Exposed Structure Granulation Quality: Red Fascia Exposed: No Necrotic Amount: None Present (0%) Fat Layer (Subcutaneous Tissue) Exposed: No Tendon Exposed: No Muscle Exposed: No Joint Exposed: No Bone Exposed: No Treatment Notes Wound #2 (Lower Leg) Wound Laterality: Left, Medial Cleanser Peri-Wound Care Topical Primary Dressing Mcgourty, Tymika R. ([id number]) Silvercel Small 2x2 (in/in) Discharge Instruction: Apply Silvercel Small 2x2 (in/in) as instructed Secondary Dressing ABD Pad 5x9 (in/in) Discharge Instruction: Cover with ABD pad Secured With Compression Wrap Medichoice 4 layer Compression System, 35-40 mmHG Discharge Instruction: Apply multi-layer wrap as directed. Compression Stockings Circaid Juxta Lite Compression Wrap Quantity: 1 Left Leg Compression Amount: 30-40 mmHg Discharge Instruction: Apply Circaid Juxta Lite Compression Wrap as directed Add-Ons Electronic Signature(s) Signed: 04/21/2021 4:28:37 PM By: Levora Dredge Entered By: Levora Dredge on 04/21/2021 08:24:28 Gatz, [name]  ([id number]) -------------------------------------------------------------------------------- Vitals Details Patient Name: [name] Date of Service: 04/21/2021 8:15 AM Medical Record Number: [id number]  Patient Account Number: [id number] Date of Birth/Sex: March 20, 1965 (56 y.o. F) Treating RN: Levora Dredge Primary Care Teyla Skidgel: Iona Beard Other Clinician: Referring Athaliah Baumbach: Iona Beard Treating Teaghan Formica/Extender: Yaakov Guthrie in Treatment: 3 Vital Signs Time Taken: 08:12 Temperature (F): 97.9 Height (in): 60 Pulse (bpm): 51 Weight (lbs): 263 Respiratory Rate (breaths/min): 18 Body Mass Index (BMI): 51.4 Blood Pressure (mmHg): 107/52 Reference Range: 80 - 120 mg / dl Electronic Signature(s) Signed: 04/21/2021 4:28:37 PM By: Levora Dredge Entered By: Levora Dredge on 04/21/2021 08:14:53

## 2021-04-28 ENCOUNTER — Encounter: Payer: Medicare Other | Attending: Internal Medicine | Admitting: Internal Medicine

## 2021-04-28 ENCOUNTER — Other Ambulatory Visit: Payer: Self-pay

## 2021-04-28 DIAGNOSIS — I5042 Chronic combined systolic (congestive) and diastolic (congestive) heart failure: Secondary | ICD-10-CM | POA: Diagnosis not present

## 2021-04-28 DIAGNOSIS — I130 Hypertensive heart and chronic kidney disease with heart failure and stage 1 through stage 4 chronic kidney disease, or unspecified chronic kidney disease: Secondary | ICD-10-CM | POA: Insufficient documentation

## 2021-04-28 DIAGNOSIS — I890 Lymphedema, not elsewhere classified: Secondary | ICD-10-CM | POA: Diagnosis not present

## 2021-04-28 DIAGNOSIS — N183 Chronic kidney disease, stage 3 unspecified: Secondary | ICD-10-CM | POA: Insufficient documentation

## 2021-04-28 DIAGNOSIS — L97822 Non-pressure chronic ulcer of other part of left lower leg with fat layer exposed: Secondary | ICD-10-CM | POA: Diagnosis not present

## 2021-04-28 DIAGNOSIS — I872 Venous insufficiency (chronic) (peripheral): Secondary | ICD-10-CM

## 2021-04-28 DIAGNOSIS — E11622 Type 2 diabetes mellitus with other skin ulcer: Secondary | ICD-10-CM | POA: Insufficient documentation

## 2021-04-28 DIAGNOSIS — Z9884 Bariatric surgery status: Secondary | ICD-10-CM | POA: Insufficient documentation

## 2021-04-28 NOTE — Progress Notes (Signed)
ALISCIA, CLAYTON ([id number]) Visit Report for 04/28/2021 Chief Complaint Document Details Patient Name: [name], [name] Date of Service: 04/28/2021 8:15 AM Medical Record Number: [id number] Patient Account Number: [id number] Date of Birth/Sex: [birth year] (56 y.o. F) Treating RN: Levora Dredge Primary Care Provider: Iona Beard Other Clinician: Referring Provider: Iona Beard Treating Provider/Extender: Yaakov Guthrie in Treatment: 4 Information Obtained from: Patient Chief Complaint Evaluation for lymphedema pumps 03/31/2021; left lower extremity ulcer Electronic Signature(s) Signed: 04/28/2021 9:08:29 AM By: Kalman Shan DO Entered By: Kalman Shan on 04/28/2021 09:02:15 [name], [name] ([id number]) -------------------------------------------------------------------------------- HPI Details Patient Name: [name] Date of Service: 04/28/2021 8:15 AM Medical Record Number: [id number] Patient Account Number: [id number] Date of Birth/Sex: 06-Nov-1965 (56 y.o. F) Treating RN: Levora Dredge Primary Care Provider: Iona Beard Other Clinician: Referring Provider: Iona Beard Treating Provider/Extender: Yaakov Guthrie in Treatment: 4 History of Present Illness HPI Description: 10/20/2020 upon evaluation today patient appears to be doing somewhat poorly in regard to her left leg where she has a significant wound although not very deep but this is definitely due to her chronic swelling. Fortunately there does not appear to be any signs of active infection at this time which is great news. There was a lot of scabbing going on and then she had areas that were somewhat open up underneath. It does appear to be very lymphedema like in nature. With that being said I do not see any signs of active infection currently. I do think that based on what we are seeing at the moment this is likely an area that will respond well to appropriate compression  therapy. If it does not then we will need to reevaluate the situation for sure. The patient does have a history of diabetes mellitus type 2, chronic venous insufficiency, lymphedema, chronic kidney disease stage III, and congestive heart failure. She is status post bariatric surgery as well. Overall I am pleased with where things stand I think she does need to definitely have compression in place. This wound has been present for about 4 to 6 weeks she tells me. 10/29/2020 upon evaluation today patient actually appears to be doing much better. She has been tolerating the dressing changes without complication and the compression wrap did slide down her a bit but to be perfectly honest I am very pleased overall with well the wound appears today. I do not see any signs of active infection which is also great news. Nonetheless I think that we are definitely headed in the appropriate direction as far as healing is concerned and in fact this is significantly improved even as compared to just last week. 11/06/2020 upon evaluation today patient appears to be doing well with regard to her wounds. In fact everything appears to be completely healed which is great news. I am extremely pleased with where things stand today. No fevers, chills, nausea, vomiting, or diarrhea. 02/17/2021 [name] is a 56 year old female with a past medical history of insulin-dependent type 2 diabetes, CKD stage III, bariatric surgery and lymphedema that presents to the clinic because she would like to order lymphedema pumps. She was seen in our clinic in August and September for an open wound to her left lower extremity due to lymphedema. She was treated With 4-layer compression for 2-1/2 weeks with resolution of her wound. She has not developed wounds since her discharge in September. She has compression stockings but states she cannot put these on daily. Readmission 03/31/2021 Patient presents with an open wound today.  She  was last seen on 02/17/2021 for lymphedema pump order. She states that she is working with insurance to get these covered. She still has not received them. She also has not been using compression stockings daily. She states these are difficult to put on. She developed a wound 2 weeks ago and has been keeping the area covered. She currently denies signs of infection. 2/8; patient presents for follow-up. She reports receiving lymphedema pumps however these were the wrong size. She states she is getting these replaced and sent in the mail soon. She did receive her juxta lite compression. She had no issues or complaints with the compression wrap this past week. She denies signs of infection. 2/15; patient presents for follow-up. She reports obtaining the correct size lymphedema pumps and started using them yesterday. She has no issues or complaints today. She denies signs of infection. 2/22; patient presents for follow-up. She has no issues or complaints today. 3/1; patient presents for follow-up. She has no issues or complaints today. She brought her juxta light compression with her. Electronic Signature(s) Signed: 04/28/2021 9:08:29 AM By: Kalman Shan DO Entered By: Kalman Shan on 04/28/2021 09:02:39 [name], [name] ([id number]) -------------------------------------------------------------------------------- Physical Exam Details Patient Name: [name] Date of Service: 04/28/2021 8:15 AM Medical Record Number: [id number] Patient Account Number: [id number] Date of Birth/Sex: Mar 17, 1965 (56 y.o. F) Treating RN: Levora Dredge Primary Care Provider: Iona Beard Other Clinician: Referring Provider: Iona Beard Treating Provider/Extender: Yaakov Guthrie in Treatment: 4 Constitutional . Cardiovascular . Psychiatric . Notes Left lower extremity: To the distal medial aspect there is epithelization to the previous wound site. Electronic Signature(s) Signed:  04/28/2021 9:08:29 AM By: Kalman Shan DO Entered By: Kalman Shan on 04/28/2021 09:02:59 [name], [name] ([id number]) -------------------------------------------------------------------------------- Physician Orders Details Patient Name: [name] Date of Service: 04/28/2021 8:15 AM Medical Record Number: [id number] Patient Account Number: [id number] Date of Birth/Sex: 27-Jun-1965 (55 y.o. F) Treating RN: Levora Dredge Primary Care Provider: Iona Beard Other Clinician: Referring Provider: Iona Beard Treating Provider/Extender: Yaakov Guthrie in Treatment: 4 Verbal / Phone Orders: No Diagnosis Coding Discharge From [facility] Services o Discharge from [facility] Complete - Please call with any issues o Moisturize legs daily after removing compression garments. o Elevate, Exercise Daily and Avoid Standing for Long Periods of Time. o DO YOUR BEST to sleep in the bed at night. DO NOT sleep in your recliner. Long hours of sitting in a recliner leads to swelling of the legs and/or potential wounds on your backside. Electronic Signature(s) Signed: 04/28/2021 9:08:29 AM By: Kalman Shan DO Entered By: Kalman Shan on 04/28/2021 09:04:28 [name], [name] ([id number]) -------------------------------------------------------------------------------- Problem List Details Patient Name: [name] Date of Service: 04/28/2021 8:15 AM Medical Record Number: [id number] Patient Account Number: [id number] Date of Birth/Sex: 1965/04/17 (55 y.o. F) Treating RN: Levora Dredge Primary Care Provider: Iona Beard Other Clinician: Referring Provider: Iona Beard Treating Provider/Extender: Yaakov Guthrie in Treatment: 4 Active Problems ICD-10 Encounter Code Description Active Date MDM Diagnosis [phone] Non-pressure chronic ulcer of other part of left lower leg with fat layer 03/31/2021 No Yes exposed I89.0 Lymphedema,  not elsewhere classified 03/31/2021 No Yes I87.2 Venous insufficiency (chronic) (peripheral) 03/31/2021 No Yes E11.622 Type 2 diabetes mellitus with other skin ulcer 03/31/2021 No Yes N18.30 Chronic kidney disease, stage 3 unspecified 03/31/2021 No Yes I50.42 Chronic combined systolic (congestive) and diastolic (congestive) heart 03/31/2021 No Yes failure Z98.84 Bariatric surgery status 03/31/2021 No Yes Inactive Problems Resolved Problems  Electronic Signature(s) Signed: 04/28/2021 9:08:29 AM By: Kalman Shan DO Entered By: Kalman Shan on 04/28/2021 09:02:07 [name], [name] ([id number]) -------------------------------------------------------------------------------- Progress Note Details Patient Name: [name] Date of Service: 04/28/2021 8:15 AM Medical Record Number: [id number] Patient Account Number: [id number] Date of Birth/Sex: Jan 06, 1966 (55 y.o. F) Treating RN: Levora Dredge Primary Care Provider: Iona Beard Other Clinician: Referring Provider: Iona Beard Treating Provider/Extender: Yaakov Guthrie in Treatment: 4 Subjective Chief Complaint Information obtained from Patient Evaluation for lymphedema pumps 03/31/2021; left lower extremity ulcer History of Present Illness (HPI) 10/20/2020 upon evaluation today patient appears to be doing somewhat poorly in regard to her left leg where she has a significant wound although not very deep but this is definitely due to her chronic swelling. Fortunately there does not appear to be any signs of active infection at this time which is great news. There was a lot of scabbing going on and then she had areas that were somewhat open up underneath. It does appear to be very lymphedema like in nature. With that being said I do not see any signs of active infection currently. I do think that based on what we are seeing at the moment this is likely an area that will respond well to appropriate compression therapy. If it does  not then we will need to reevaluate the situation for sure. The patient does have a history of diabetes mellitus type 2, chronic venous insufficiency, lymphedema, chronic kidney disease stage III, and congestive heart failure. She is status post bariatric surgery as well. Overall I am pleased with where things stand I think she does need to definitely have compression in place. This wound has been present for about 4 to 6 weeks she tells me. 10/29/2020 upon evaluation today patient actually appears to be doing much better. She has been tolerating the dressing changes without complication and the compression wrap did slide down her a bit but to be perfectly honest I am very pleased overall with well the wound appears today. I do not see any signs of active infection which is also great news. Nonetheless I think that we are definitely headed in the appropriate direction as far as healing is concerned and in fact this is significantly improved even as compared to just last week. 11/06/2020 upon evaluation today patient appears to be doing well with regard to her wounds. In fact everything appears to be completely healed which is great news. I am extremely pleased with where things stand today. No fevers, chills, nausea, vomiting, or diarrhea. 02/17/2021 [name] is a 56 year old female with a past medical history of insulin-dependent type 2 diabetes, CKD stage III, bariatric surgery and lymphedema that presents to the clinic because she would like to order lymphedema pumps. She was seen in our clinic in August and September for an open wound to her left lower extremity due to lymphedema. She was treated With 4-layer compression for 2-1/2 weeks with resolution of her wound. She has not developed wounds since her discharge in September. She has compression stockings but states she cannot put these on daily. Readmission 03/31/2021 Patient presents with an open wound today. She was last seen on  02/17/2021 for lymphedema pump order. She states that she is working with insurance to get these covered. She still has not received them. She also has not been using compression stockings daily. She states these are difficult to put on. She developed a wound 2 weeks ago and has been keeping the area covered.  She currently denies signs of infection. 2/8; patient presents for follow-up. She reports receiving lymphedema pumps however these were the wrong size. She states she is getting these replaced and sent in the mail soon. She did receive her juxta lite compression. She had no issues or complaints with the compression wrap this past week. She denies signs of infection. 2/15; patient presents for follow-up. She reports obtaining the correct size lymphedema pumps and started using them yesterday. She has no issues or complaints today. She denies signs of infection. 2/22; patient presents for follow-up. She has no issues or complaints today. 3/1; patient presents for follow-up. She has no issues or complaints today. She brought her juxta light compression with her. Objective Constitutional Vitals Time Taken: 8:09 AM, Height: 60 in, Weight: 263 lbs, BMI: 51.4, Temperature: 98.3 F, Pulse: 51 bpm, Respiratory Rate: 18 breaths/min, Blood Pressure: 117/40 mmHg. [name], [name] Kitchen ([id number]) General Notes: Left lower extremity: To the distal medial aspect there is epithelization to the previous wound site. Integumentary (Hair, Skin) Wound #2 status is Healed - Epithelialized. Original cause of wound was Gradually Appeared. The date acquired was: 03/18/2021. The wound has been in treatment 4 weeks. The wound is located on the Left,Medial Lower Leg. The wound measures 0cm length x 0cm width x 0cm depth; 0cm^2 area and 0cm^3 volume. There is no tunneling or undermining noted. There is a none present amount of drainage noted. There is no granulation within the wound bed. There is no necrotic tissue  within the wound bed. Assessment Active Problems ICD-10 Non-pressure chronic ulcer of other part of left lower leg with fat layer exposed Lymphedema, not elsewhere classified Venous insufficiency (chronic) (peripheral) Type 2 diabetes mellitus with other skin ulcer Chronic kidney disease, stage 3 unspecified Chronic combined systolic (congestive) and diastolic (congestive) heart failure Bariatric surgery status Patient has done well with silver cell and 4-layer compression. Her wound is healed. She brought in her juxta lite compression and we showed her how to use this today. She knows to call with any questions or concerns. She can follow-up as needed. Plan Discharge From [facility] Services: Discharge from [place] [facility] Complete - Please call with any issues Moisturize legs daily after removing compression garments. Elevate, Exercise Daily and Avoid Standing for Long Periods of Time. DO YOUR BEST to sleep in the bed at night. DO NOT sleep in your recliner. Long hours of sitting in a recliner leads to swelling of the legs and/or potential wounds on your backside. 1. Juxta light compression daily 2. Discharge from clinic due to closed wound 3. Follow-up as needed Electronic Signature(s) Signed: 04/28/2021 9:08:29 AM By: Kalman Shan DO Entered By: Kalman Shan on 04/28/2021 09:03:58 [name], [name] ([id number]) -------------------------------------------------------------------------------- SuperBill Details Patient Name: [name] Date of Service: 04/28/2021 Medical Record Number: [id number] Patient Account Number: [id number] Date of Birth/Sex: 11-Jul-1965 (55 y.o. F) Treating RN: Levora Dredge Primary Care Provider: Iona Beard Other Clinician: Referring Provider: Iona Beard Treating Provider/Extender: Yaakov Guthrie in Treatment: 4 Diagnosis Coding ICD-10 Codes Code Description [phone] Non-pressure chronic ulcer of other part of left  lower leg with fat layer exposed I89.0 Lymphedema, not elsewhere classified I87.2 Venous insufficiency (chronic) (peripheral) E11.622 Type 2 diabetes mellitus with other skin ulcer N18.30 Chronic kidney disease, stage 3 unspecified 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.84 Bariatric surgery status Facility Procedures CPT4 Code: [id number] Description: 37628 - WOUND CARE VISIT-LEV 2 EST PT Modifier: Quantity: 1 Physician Procedures CPT4 Code:  [id number] Description: 99213 - WC PHYS LEVEL 3 - EST PT Modifier: Quantity: 1 CPT4 Code: Description: ICD-10 Diagnosis Description L97.822 Non-pressure chronic ulcer of other part of left lower leg with fat lay I89.0 Lymphedema, not elsewhere classified I87.2 Venous insufficiency (chronic) (peripheral) E11.622 Type 2 diabetes mellitus with  other skin ulcer Modifier: er exposed Quantity: Electronic Signature(s) Signed: 04/28/2021 9:08:29 AM By: Kalman Shan DO Entered By: Kalman Shan on 04/28/2021 09:04:15

## 2021-04-28 NOTE — Progress Notes (Signed)
JAICE, LAGUE ([id number]) Visit Report for 04/28/2021 Arrival Information Details Patient Name: [name], [name] Date of Service: 04/28/2021 8:15 AM Medical Record Number: [id number] Patient Account Number: [id number] Date of Birth/Sex: 13-Dec-1965 (56 y.o. F) Treating RN: Levora Dredge Primary Care Ernisha Sorn: Iona Beard Other Clinician: Referring Jonty Morrical: Iona Beard Treating Martino [name]/Extender: Yaakov Guthrie in Treatment: 4 Visit Information History Since Last Visit Added or deleted any medications: No Patient Arrived: Ambulatory Any new allergies or adverse reactions: No Arrival Time: 08:05 Hospitalized since last visit: No Accompanied By: self Has Dressing in Place as Prescribed: Yes Transfer Assistance: None Has Compression in Place as Prescribed: Yes Patient Identification Verified: Yes Pain Present Now: No Secondary Verification Process Completed: Yes Patient Requires Transmission-Based No Precautions: Patient Has Alerts: Yes Patient Alerts: DIABETIC R ABI 1.39/.80 03/10/21 L ABI 1.29/.74 03/10/21 Electronic Signature(s) Signed: 04/28/2021 3:02:55 PM By: Levora Dredge Entered By: Levora Dredge on 04/28/2021 08:08:58 Sebesta, [name] ([id number]) -------------------------------------------------------------------------------- Clinic Level of Care Assessment Details Patient Name: [name] Date of Service: 04/28/2021 8:15 AM Medical Record Number: [id number] Patient Account Number: [id number] Date of Birth/Sex: 1965-08-05 (55 y.o. F) Treating RN: Levora Dredge Primary Care Emerie Vanderkolk: Iona Beard Other Clinician: Referring Afrika Brick: Iona Beard Treating Moustafa Mossa/Extender: Yaakov Guthrie in Treatment: 4 Clinic Level of Care Assessment Items TOOL 4 Quantity Score []  - Use when only an EandM is performed on FOLLOW-UP visit 0 ASSESSMENTS - Nursing Assessment / Reassessment []  - Reassessment of Co-morbidities (includes  updates in patient status) 0 X- 1 5 Reassessment of Adherence to Treatment Plan ASSESSMENTS - Wound and Skin Assessment / Reassessment X - Simple Wound Assessment / Reassessment - one wound 1 5 []  - 0 Complex Wound Assessment / Reassessment - multiple wounds []  - 0 Dermatologic / Skin Assessment (not related to wound area) ASSESSMENTS - Focused Assessment []  - Circumferential Edema Measurements - multi extremities 0 []  - 0 Nutritional Assessment / Counseling / Intervention []  - 0 Lower Extremity Assessment (monofilament, tuning fork, pulses) []  - 0 Peripheral Arterial Disease Assessment (using hand held doppler) ASSESSMENTS - Ostomy and/or Continence Assessment and Care []  - Incontinence Assessment and Management 0 []  - 0 Ostomy Care Assessment and Management (repouching, etc.) PROCESS - Coordination of Care X - Simple Patient / Family Education for ongoing care 1 15 []  - 0 Complex (extensive) Patient / Family Education for ongoing care []  - 0 Staff obtains Programmer, systems, Records, Test Results / Process Orders []  - 0 Staff telephones HHA, Nursing Homes / Clarify orders / etc []  - 0 Routine Transfer to another Facility (non-emergent condition) []  - 0 Routine Hospital Admission (non-emergent condition) []  - 0 New Admissions / Biomedical engineer / Ordering NPWT, Apligraf, etc. []  - 0 Emergency Hospital Admission (emergent condition) X- 1 10 Simple Discharge Coordination []  - 0 Complex (extensive) Discharge Coordination PROCESS - Special Needs []  - Pediatric / Minor Patient Management 0 []  - 0 Isolation Patient Management []  - 0 Hearing / Language / Visual special needs []  - 0 Assessment of Community assistance (transportation, D/C planning, etc.) []  - 0 Additional assistance / Altered mentation []  - 0 Support Surface(s) Assessment (bed, cushion, seat, etc.) INTERVENTIONS - Wound Cleansing / Measurement Deandrade, Yenifer R. ([id number]) X- 1 5 Simple Wound  Cleansing - one wound []  - 0 Complex Wound Cleansing - multiple wounds X- 1 5 Wound Imaging (photographs - any number of wounds) []  - 0 Wound Tracing (instead of photographs) []  - 0 Simple Wound Measurement - one wound []  -  0 Complex Wound Measurement - multiple wounds INTERVENTIONS - Wound Dressings []  - Small Wound Dressing one or multiple wounds 0 []  - 0 Medium Wound Dressing one or multiple wounds []  - 0 Large Wound Dressing one or multiple wounds []  - 0 Application of Medications - topical []  - 0 Application of Medications - injection INTERVENTIONS - Miscellaneous []  - External ear exam 0 []  - 0 Specimen Collection (cultures, biopsies, blood, body fluids, etc.) []  - 0 Specimen(s) / Culture(s) sent or taken to Lab for analysis []  - 0 Patient Transfer (multiple staff / Civil Service fast streamer / Similar devices) []  - 0 Simple Staple / Suture removal (25 or less) []  - 0 Complex Staple / Suture removal (26 or more) []  - 0 Hypo / Hyperglycemic Management (close monitor of Blood Glucose) []  - 0 Ankle / Brachial Index (ABI) - do not check if billed separately X- 1 5 Vital Signs Has the patient been seen at the hospital within the last three years: Yes Total Score: 50 Level Of Care: New/Established - Level 2 Electronic Signature(s) Signed: 04/28/2021 3:02:55 PM By: Levora Dredge Entered By: Levora Dredge on 04/28/2021 08:57:10 Besser, [name] ([id number]) -------------------------------------------------------------------------------- Encounter Discharge Information Details Patient Name: [name] Date of Service: 04/28/2021 8:15 AM Medical Record Number: [id number] Patient Account Number: [id number] Date of Birth/Sex: 09/17/1965 (55 y.o. F) Treating RN: Levora Dredge Primary Care Larkyn Greenberger: Iona Beard Other Clinician: Referring Lue Sykora: Iona Beard Treating Laurin Paulo/Extender: Yaakov Guthrie in Treatment: 4 Encounter Discharge Information  Items Discharge Condition: Stable Ambulatory Status: Ambulatory Discharge Destination: Home Transportation: Private Auto Accompanied By: self Schedule Follow-up Appointment: Yes Clinical Summary of Care: Electronic Signature(s) Signed: 04/28/2021 3:02:55 PM By: Levora Dredge Entered By: Levora Dredge on 04/28/2021 08:58:02 Niemeier, [name] ([id number]) -------------------------------------------------------------------------------- Lower Extremity Assessment Details Patient Name: [name] Date of Service: 04/28/2021 8:15 AM Medical Record Number: [id number] Patient Account Number: [id number] Date of Birth/Sex: 01-13-1966 (55 y.o. F) Treating RN: Levora Dredge Primary Care Kamera Dubas: Iona Beard Other Clinician: Referring Robynn Marcel: Iona Beard Treating Ladasia Sircy/Extender: Yaakov Guthrie in Treatment: 4 Edema Assessment Assessed: [Left: No] [Right: No] Edema: [Left: Ye] [Right: s] Calf Left: Right: Point of Measurement: 30 cm From Medial Instep 51.8 cm Ankle Left: Right: Point of Measurement: 10 cm From Medial Instep 27.4 cm Vascular Assessment Pulses: Dorsalis Pedis Palpable: [Left:Yes] Electronic Signature(s) Signed: 04/28/2021 3:02:55 PM By: Levora Dredge Entered By: Levora Dredge on 04/28/2021 08:19:34 Repka, [name] ([id number]) -------------------------------------------------------------------------------- Multi Wound Chart Details Patient Name: [name] Date of Service: 04/28/2021 8:15 AM Medical Record Number: [id number] Patient Account Number: [id number] Date of Birth/Sex: 06/02/1965 (56 y.o. F) Treating RN: Levora Dredge Primary Care Jayel Inks: Iona Beard Other Clinician: Referring Yolani Vo: Iona Beard Treating Nithin Demeo/Extender: Yaakov Guthrie in Treatment: 4 Vital Signs Height(in): 60 Pulse(bpm): 51 Weight(lbs): 263 Blood Pressure(mmHg): 117/40 Body Mass Index(BMI): 51.4 Temperature(F):  98.3 Respiratory Rate(breaths/min): 18 Photos: [N/A:N/A] Wound Location: Left, Medial Lower Leg N/A N/A Wounding Event: Gradually Appeared N/A N/A Primary Etiology: Diabetic Wound/Ulcer of the Lower N/A N/A Extremity Comorbid History: Cataracts, Lymphedema, Asthma, N/A N/A Congestive Heart Failure, Hypertension, Type II Diabetes, Osteoarthritis, Neuropathy Date Acquired: 03/18/2021 N/A N/A Weeks of Treatment: 4 N/A N/A Wound Status: Open N/A N/A Wound Recurrence: No N/A N/A Measurements L x W x D (cm) 0.1x0.1x0.1 N/A N/A Area (cm) : 0.008 N/A N/A Volume (cm) : 0.001 N/A N/A % Reduction in Area: 99.70% N/A N/A % Reduction in Volume: 99.60% N/A N/A Classification: Grade 2 N/A  N/A Exudate Amount: None Present N/A N/A Granulation Amount: None Present (0%) N/A N/A Necrotic Amount: None Present (0%) N/A N/A Exposed Structures: Fascia: No N/A N/A Fat Layer (Subcutaneous Tissue): No Tendon: No Muscle: No Joint: No Bone: No Epithelialization: Large (67-100%) N/A N/A Treatment Notes Electronic Signature(s) Signed: 04/28/2021 3:02:55 PM By: Levora Dredge Entered By: Levora Dredge on 04/28/2021 08:20:21 Hoskin, [name] ([id number]) -------------------------------------------------------------------------------- Multi-Disciplinary Care Plan Details Patient Name: [name] Date of Service: 04/28/2021 8:15 AM Medical Record Number: [id number] Patient Account Number: [id number] Date of Birth/Sex: 06-Aug-1965 (56 y.o. F) Treating RN: Levora Dredge Primary Care Denisha Hoel: Iona Beard Other Clinician: Referring Maclain Cohron: Iona Beard Treating Sayid Moll/Extender: Yaakov Guthrie in Treatment: 4 Active Inactive Electronic Signature(s) Signed: 04/28/2021 3:02:55 PM By: Levora Dredge Entered By: Levora Dredge on 04/28/2021 08:20:14 Lardizabal, [name] ([id number]) -------------------------------------------------------------------------------- Pain  Assessment Details Patient Name: [name] Date of Service: 04/28/2021 8:15 AM Medical Record Number: [id number] Patient Account Number: [id number] Date of Birth/Sex: [birth year] (56 y.o. F) Treating RN: Levora Dredge Primary Care Meric Joye: Iona Beard Other Clinician: Referring Lakeith Careaga: Iona Beard Treating Carvin Almas/Extender: Yaakov Guthrie in Treatment: 4 Active Problems Location of Pain Severity and Description of Pain Patient Has Paino No Site Locations Rate the pain. Current Pain Level: 0 Pain Management and Medication Current Pain Management: Electronic Signature(s) Signed: 04/28/2021 3:02:55 PM By: Levora Dredge Entered By: Levora Dredge on 04/28/2021 08:11:33 Moltz, [name] ([id number]) -------------------------------------------------------------------------------- Patient/Caregiver Education Details Patient Name: [name] Date of Service: 04/28/2021 8:15 AM Medical Record Number: [id number] Patient Account Number: [id number] Date of Birth/Gender: 10-18-1965 (56 y.o. F) Treating RN: Levora Dredge Primary Care Physician: Iona Beard Other Clinician: Referring Physician: Iona Beard Treating Physician/Extender: Yaakov Guthrie in Treatment: 4 Education Assessment Education Provided To: Patient Education Topics Provided Wound/Skin Impairment: Handouts: Other: care of healed wound Methods: Explain/Verbal Responses: State content correctly Electronic Signature(s) Signed: 04/28/2021 3:02:55 PM By: Levora Dredge Entered By: Levora Dredge on 04/28/2021 08:57:32 Defrank, [name] ([id number]) -------------------------------------------------------------------------------- Wound Assessment Details Patient Name: [name] Date of Service: 04/28/2021 8:15 AM Medical Record Number: [id number] Patient Account Number: [id number] Date of Birth/Sex: 10-28-1965 (56 y.o. F) Treating RN: Levora Dredge Primary Care Damarko Stitely: Iona Beard Other Clinician: Referring Jeany Seville: Iona Beard Treating Kadelyn Dimascio/Extender: Yaakov Guthrie in Treatment: 4 Wound Status Wound Number: 2 Primary Diabetic Wound/Ulcer of the Lower Extremity Etiology: Wound Location: Left, Medial Lower Leg Wound Healed - Epithelialized Wounding Event: Gradually Appeared Status: Date Acquired: 03/18/2021 Comorbid Cataracts, Lymphedema, Asthma, Congestive Heart Weeks Of Treatment: 4 History: Failure, Hypertension, Type II Diabetes, Osteoarthritis, Clustered Wound: No Neuropathy Photos Wound Measurements Length: (cm) 0 % Red Width: (cm) 0 % Red Depth: (cm) 0 Epith Area: (cm) 0 Tunn Volume: (cm) 0 Unde uction in Area: 100% uction in Volume: 100% elialization: Large (67-100%) eling: No rmining: No Wound Description Classification: Grade 2 Foul Exudate Amount: None Present Sloug Odor After Cleansing: No h/Fibrino No Wound Bed Granulation Amount: None Present (0%) Exposed Structure Necrotic Amount: None Present (0%) Fascia Exposed: No Fat Layer (Subcutaneous Tissue) Exposed: No Tendon Exposed: No Muscle Exposed: No Joint Exposed: No Bone Exposed: No Treatment Notes Wound #2 (Lower Leg) Wound Laterality: Left, Medial Cleanser Peri-Wound Care Topical Primary Dressing JAIDYNN, BALSTER ([id number]) Secondary Dressing Secured With Compression Wrap Compression Stockings Add-Ons Electronic Signature(s) Signed: 04/28/2021 3:02:55 PM By: Levora Dredge Entered By: Levora Dredge on 04/28/2021 08:55:49 Woods, Cerissa R. ([id number]) -------------------------------------------------------------------------------- Vitals Details Patient Name: [name]. Date of  Service: 04/28/2021 8:15 AM Medical Record Number: [id number] Patient Account Number: [id number] Date of Birth/Sex: 06-Oct-1965 (56 y.o. F) Treating RN: Levora Dredge Primary Care Dyanara Cozza: Iona Beard Other  Clinician: Referring Espyn Radwan: Iona Beard Treating Anayiah Howden/Extender: Yaakov Guthrie in Treatment: 4 Vital Signs Time Taken: 08:09 Temperature (F): 98.3 Height (in): 60 Pulse (bpm): 51 Weight (lbs): 263 Respiratory Rate (breaths/min): 18 Body Mass Index (BMI): 51.4 Blood Pressure (mmHg): 117/40 Reference Range: 80 - 120 mg / dl Electronic Signature(s) Signed: 04/28/2021 3:02:55 PM By: Levora Dredge Entered By: Levora Dredge on 04/28/2021 08:11:23

## 2021-05-05 ENCOUNTER — Encounter: Payer: Medicare Other | Admitting: Internal Medicine

## 2021-05-11 DIAGNOSIS — I10 Essential (primary) hypertension: Secondary | ICD-10-CM | POA: Diagnosis not present

## 2021-05-11 DIAGNOSIS — I5032 Chronic diastolic (congestive) heart failure: Secondary | ICD-10-CM | POA: Diagnosis not present

## 2021-05-11 DIAGNOSIS — N183 Chronic kidney disease, stage 3 unspecified: Secondary | ICD-10-CM | POA: Diagnosis not present

## 2021-05-11 DIAGNOSIS — E039 Hypothyroidism, unspecified: Secondary | ICD-10-CM | POA: Diagnosis not present

## 2021-05-11 DIAGNOSIS — E1122 Type 2 diabetes mellitus with diabetic chronic kidney disease: Secondary | ICD-10-CM | POA: Diagnosis not present

## 2021-05-12 ENCOUNTER — Encounter: Payer: Medicare Other | Admitting: Internal Medicine

## 2021-05-17 DIAGNOSIS — J45998 Other asthma: Secondary | ICD-10-CM | POA: Diagnosis not present

## 2021-05-17 DIAGNOSIS — Z794 Long term (current) use of insulin: Secondary | ICD-10-CM | POA: Diagnosis not present

## 2021-05-17 DIAGNOSIS — E108 Type 1 diabetes mellitus with unspecified complications: Secondary | ICD-10-CM | POA: Diagnosis not present

## 2021-05-18 DIAGNOSIS — M1830 Unilateral post-traumatic osteoarthritis of first carpometacarpal joint, unspecified hand: Secondary | ICD-10-CM | POA: Diagnosis not present

## 2021-05-18 DIAGNOSIS — E039 Hypothyroidism, unspecified: Secondary | ICD-10-CM | POA: Diagnosis not present

## 2021-05-18 DIAGNOSIS — E211 Secondary hyperparathyroidism, not elsewhere classified: Secondary | ICD-10-CM | POA: Diagnosis not present

## 2021-05-18 DIAGNOSIS — E1122 Type 2 diabetes mellitus with diabetic chronic kidney disease: Secondary | ICD-10-CM | POA: Diagnosis not present

## 2021-05-18 DIAGNOSIS — N189 Chronic kidney disease, unspecified: Secondary | ICD-10-CM | POA: Diagnosis not present

## 2021-05-18 DIAGNOSIS — R809 Proteinuria, unspecified: Secondary | ICD-10-CM | POA: Diagnosis not present

## 2021-05-18 DIAGNOSIS — I10 Essential (primary) hypertension: Secondary | ICD-10-CM | POA: Diagnosis not present

## 2021-05-18 DIAGNOSIS — D631 Anemia in chronic kidney disease: Secondary | ICD-10-CM | POA: Diagnosis not present

## 2021-05-19 ENCOUNTER — Encounter: Payer: Medicare Other | Admitting: Internal Medicine

## 2021-05-20 DIAGNOSIS — N189 Chronic kidney disease, unspecified: Secondary | ICD-10-CM | POA: Diagnosis not present

## 2021-05-20 DIAGNOSIS — E211 Secondary hyperparathyroidism, not elsewhere classified: Secondary | ICD-10-CM | POA: Diagnosis not present

## 2021-05-20 DIAGNOSIS — E1122 Type 2 diabetes mellitus with diabetic chronic kidney disease: Secondary | ICD-10-CM | POA: Diagnosis not present

## 2021-05-20 DIAGNOSIS — N170 Acute kidney failure with tubular necrosis: Secondary | ICD-10-CM | POA: Diagnosis not present

## 2021-05-20 DIAGNOSIS — I129 Hypertensive chronic kidney disease with stage 1 through stage 4 chronic kidney disease, or unspecified chronic kidney disease: Secondary | ICD-10-CM | POA: Diagnosis not present

## 2021-05-20 DIAGNOSIS — R809 Proteinuria, unspecified: Secondary | ICD-10-CM | POA: Diagnosis not present

## 2021-05-20 DIAGNOSIS — E1129 Type 2 diabetes mellitus with other diabetic kidney complication: Secondary | ICD-10-CM | POA: Diagnosis not present

## 2021-05-24 ENCOUNTER — Ambulatory Visit: Payer: Medicare Other | Admitting: Gastroenterology

## 2021-05-26 ENCOUNTER — Encounter: Payer: Medicare Other | Admitting: Internal Medicine

## 2021-05-28 DIAGNOSIS — I129 Hypertensive chronic kidney disease with stage 1 through stage 4 chronic kidney disease, or unspecified chronic kidney disease: Secondary | ICD-10-CM | POA: Diagnosis not present

## 2021-05-28 DIAGNOSIS — E1122 Type 2 diabetes mellitus with diabetic chronic kidney disease: Secondary | ICD-10-CM | POA: Diagnosis not present

## 2021-06-03 DIAGNOSIS — L110 Acquired keratosis follicularis: Secondary | ICD-10-CM | POA: Diagnosis not present

## 2021-06-03 DIAGNOSIS — E1140 Type 2 diabetes mellitus with diabetic neuropathy, unspecified: Secondary | ICD-10-CM | POA: Diagnosis not present

## 2021-06-03 DIAGNOSIS — L609 Nail disorder, unspecified: Secondary | ICD-10-CM | POA: Diagnosis not present

## 2021-06-08 DIAGNOSIS — E1122 Type 2 diabetes mellitus with diabetic chronic kidney disease: Secondary | ICD-10-CM | POA: Diagnosis not present

## 2021-06-08 DIAGNOSIS — R809 Proteinuria, unspecified: Secondary | ICD-10-CM | POA: Diagnosis not present

## 2021-06-08 DIAGNOSIS — I129 Hypertensive chronic kidney disease with stage 1 through stage 4 chronic kidney disease, or unspecified chronic kidney disease: Secondary | ICD-10-CM | POA: Diagnosis not present

## 2021-06-08 DIAGNOSIS — N170 Acute kidney failure with tubular necrosis: Secondary | ICD-10-CM | POA: Diagnosis not present

## 2021-06-08 DIAGNOSIS — N189 Chronic kidney disease, unspecified: Secondary | ICD-10-CM | POA: Diagnosis not present

## 2021-06-09 DIAGNOSIS — E1122 Type 2 diabetes mellitus with diabetic chronic kidney disease: Secondary | ICD-10-CM | POA: Diagnosis not present

## 2021-06-09 DIAGNOSIS — E1129 Type 2 diabetes mellitus with other diabetic kidney complication: Secondary | ICD-10-CM | POA: Diagnosis not present

## 2021-06-09 DIAGNOSIS — I129 Hypertensive chronic kidney disease with stage 1 through stage 4 chronic kidney disease, or unspecified chronic kidney disease: Secondary | ICD-10-CM | POA: Diagnosis not present

## 2021-06-09 DIAGNOSIS — N189 Chronic kidney disease, unspecified: Secondary | ICD-10-CM | POA: Diagnosis not present

## 2021-06-09 DIAGNOSIS — E211 Secondary hyperparathyroidism, not elsewhere classified: Secondary | ICD-10-CM | POA: Diagnosis not present

## 2021-06-09 DIAGNOSIS — R809 Proteinuria, unspecified: Secondary | ICD-10-CM | POA: Diagnosis not present

## 2021-06-09 DIAGNOSIS — N170 Acute kidney failure with tubular necrosis: Secondary | ICD-10-CM | POA: Diagnosis not present

## 2021-06-17 DIAGNOSIS — M25579 Pain in unspecified ankle and joints of unspecified foot: Secondary | ICD-10-CM | POA: Diagnosis not present

## 2021-06-17 DIAGNOSIS — M7751 Other enthesopathy of right foot: Secondary | ICD-10-CM | POA: Diagnosis not present

## 2021-06-17 DIAGNOSIS — M2011 Hallux valgus (acquired), right foot: Secondary | ICD-10-CM | POA: Diagnosis not present

## 2021-06-17 DIAGNOSIS — M79671 Pain in right foot: Secondary | ICD-10-CM | POA: Diagnosis not present

## 2021-06-17 DIAGNOSIS — J45998 Other asthma: Secondary | ICD-10-CM | POA: Diagnosis not present

## 2021-07-07 DIAGNOSIS — Z794 Long term (current) use of insulin: Secondary | ICD-10-CM | POA: Diagnosis not present

## 2021-07-07 DIAGNOSIS — E108 Type 1 diabetes mellitus with unspecified complications: Secondary | ICD-10-CM | POA: Diagnosis not present

## 2021-07-08 DIAGNOSIS — M79671 Pain in right foot: Secondary | ICD-10-CM | POA: Diagnosis not present

## 2021-07-08 DIAGNOSIS — M2011 Hallux valgus (acquired), right foot: Secondary | ICD-10-CM | POA: Diagnosis not present

## 2021-07-08 DIAGNOSIS — M79674 Pain in right toe(s): Secondary | ICD-10-CM | POA: Diagnosis not present

## 2021-07-15 DIAGNOSIS — N184 Chronic kidney disease, stage 4 (severe): Secondary | ICD-10-CM | POA: Diagnosis not present

## 2021-07-15 DIAGNOSIS — Z9884 Bariatric surgery status: Secondary | ICD-10-CM | POA: Diagnosis not present

## 2021-07-15 DIAGNOSIS — Z794 Long term (current) use of insulin: Secondary | ICD-10-CM | POA: Diagnosis not present

## 2021-07-15 DIAGNOSIS — E1122 Type 2 diabetes mellitus with diabetic chronic kidney disease: Secondary | ICD-10-CM | POA: Diagnosis not present

## 2021-07-17 DIAGNOSIS — J45998 Other asthma: Secondary | ICD-10-CM | POA: Diagnosis not present

## 2021-07-21 ENCOUNTER — Ambulatory Visit (INDEPENDENT_AMBULATORY_CARE_PROVIDER_SITE_OTHER): Payer: Self-pay | Admitting: *Deleted

## 2021-07-21 VITALS — Ht 60.5 in | Wt 261.0 lb

## 2021-07-21 DIAGNOSIS — Z86010 Personal history of colonic polyps: Secondary | ICD-10-CM

## 2021-07-21 NOTE — Progress Notes (Signed)
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  She also says she has Dexcom and wants to see if it needs removing prior to procedure.

## 2021-07-21 NOTE — Progress Notes (Addendum)
Gastroenterology Pre-Procedure Review  Request Date: 07/21/2021 Requesting Physician: Last TCS 09/22/2017 by Dr. Oneida Alar, tubular adenoma (x3)  PATIENT REVIEW QUESTIONS: The patient responded to the following health history questions as indicated:    1. Diabetes Melitis: yes, type II  2. Joint replacements in the past 12 months: no 3. Major health problems in the past 3 months: no 4. Has an artificial valve or MVP: no 5. Has a defibrillator: no 6. Has been advised in past to take antibiotics in advance of a procedure like teeth cleaning: no 7. Family history of colon cancer: no  8. Alcohol Use: yes, 1 drink in a year 9. Illicit drug Use: no 10. History of sleep apnea: yes, but doesn't use CPAP  11. History of coronary artery or other vascular stents placed within the last 12 months: no 12. History of any prior anesthesia complications: no 13. Body mass index is 50.13 kg/m.    MEDICATIONS & ALLERGIES:    Patient reports the following regarding taking any blood thinners:   Plavix? no Aspirin? no Coumadin? no Brilinta? no Xarelto? no Eliquis? no Pradaxa? no Savaysa? no Effient? no  Patient confirms/reports the following medications:  Current Outpatient Medications  Medication Sig Dispense Refill   acetaminophen (TYLENOL) 500 MG tablet Take 500 mg by mouth as needed for moderate pain.     albuterol (PROVENTIL HFA;VENTOLIN HFA) 108 (90 BASE) MCG/ACT inhaler Inhale 2 puffs into the lungs every 6 (six) hours as needed for wheezing.     albuterol (PROVENTIL) (2.5 MG/3ML) 0.083% nebulizer solution Take 3 mLs (2.5 mg total) by nebulization every 6 (six) hours as needed for wheezing or shortness of breath. 75 mL 12   amLODipine (NORVASC) 5 MG tablet TAKE ONE TABLET BY MOUTH ONCE DAILY. 90 tablet 0   atorvastatin (LIPITOR) 20 MG tablet Take 20 mg by mouth daily.      calcitRIOL (ROCALTROL) 0.25 MCG capsule Take 0.25 mcg by mouth as directed.     Calcium Carbonate-Vitamin D (CALCIUM 600+D  PO) Take 1 tablet by mouth daily.     carboxymethylcellulose (REFRESH PLUS) 0.5 % SOLN Place 1 drop into both eyes 3 (three) times daily as needed (dry eyes).     carvedilol (COREG) 6.25 MG tablet Take 6.25 mg by mouth 2 (two) times daily.      Cholecalciferol (VITAMIN D) 2000 units tablet Take 2,000 Units by mouth daily.      Fluticasone-Salmeterol (ADVAIR) 100-50 MCG/DOSE AEPB Inhale 1 puff into the lungs 2 (two) times daily.     gabapentin (NEURONTIN) 300 MG capsule Take 1 capsule (300 mg total) by mouth 4 (four) times daily. 120 capsule 2   hydrALAZINE (APRESOLINE) 25 MG tablet Take 1 tablet (25 mg total) by mouth 3 (three) times daily. 90 tablet 3   Insulin Human (INSULIN PUMP) SOLN Inject into the skin.     levothyroxine (SYNTHROID, LEVOTHROID) 75 MCG tablet Take 75 mcg by mouth daily before breakfast.     montelukast (SINGULAIR) 10 MG tablet Take 10 mg by mouth daily.      Multiple Vitamins-Minerals (MULTIVITAMIN WITH MINERALS) tablet Take 1 tablet by mouth daily.     torsemide (DEMADEX) 20 MG tablet Take 20 mg by mouth 2 (two) times a day. Two tablets in morning and one in the evening     No current facility-administered medications for this visit.    Patient confirms/reports the following allergies:  No Known Allergies  No orders of the defined types were placed in this  encounter.   AUTHORIZATION INFORMATION Primary Insurance: [employer] Medicare,  ID #: [id number],  Group #: [id number] Pre-Cert / Josem Kaufmann required:  Pre-Cert / Auth #:   Secondary Insurance: Medicaid [place] Access,  ID #:  Solicitor / Auth required: [id number] Pre-Cert / Josem Kaufmann #: No, not required  SCHEDULE INFORMATION: Procedure has been scheduled as follows:  Date: , Time:   Location: [facility] with Dr. Abbey Chatters  This Gastroenterology Pre-Precedure Review Form is being routed to the following provider(s): Venetia Night, NP

## 2021-07-22 NOTE — Progress Notes (Signed)
Spoke to pt.  She was made aware that she needs ov due to medical hx.  Scheduled ov for 08/04/2021 at 9:00 with Venetia Night, NP.

## 2021-07-22 NOTE — Progress Notes (Signed)
ASA 3, needs office visit. 

## 2021-07-23 DIAGNOSIS — N184 Chronic kidney disease, stage 4 (severe): Secondary | ICD-10-CM | POA: Diagnosis not present

## 2021-07-23 DIAGNOSIS — E1122 Type 2 diabetes mellitus with diabetic chronic kidney disease: Secondary | ICD-10-CM | POA: Diagnosis not present

## 2021-07-23 DIAGNOSIS — N170 Acute kidney failure with tubular necrosis: Secondary | ICD-10-CM | POA: Diagnosis not present

## 2021-07-23 DIAGNOSIS — Z794 Long term (current) use of insulin: Secondary | ICD-10-CM | POA: Diagnosis not present

## 2021-07-23 DIAGNOSIS — N189 Chronic kidney disease, unspecified: Secondary | ICD-10-CM | POA: Diagnosis not present

## 2021-07-23 DIAGNOSIS — I129 Hypertensive chronic kidney disease with stage 1 through stage 4 chronic kidney disease, or unspecified chronic kidney disease: Secondary | ICD-10-CM | POA: Diagnosis not present

## 2021-07-23 DIAGNOSIS — R809 Proteinuria, unspecified: Secondary | ICD-10-CM | POA: Diagnosis not present

## 2021-07-28 DIAGNOSIS — I129 Hypertensive chronic kidney disease with stage 1 through stage 4 chronic kidney disease, or unspecified chronic kidney disease: Secondary | ICD-10-CM | POA: Diagnosis not present

## 2021-07-28 DIAGNOSIS — E1129 Type 2 diabetes mellitus with other diabetic kidney complication: Secondary | ICD-10-CM | POA: Diagnosis not present

## 2021-07-28 DIAGNOSIS — R809 Proteinuria, unspecified: Secondary | ICD-10-CM | POA: Diagnosis not present

## 2021-07-28 DIAGNOSIS — E870 Hyperosmolality and hypernatremia: Secondary | ICD-10-CM | POA: Diagnosis not present

## 2021-07-28 DIAGNOSIS — E1122 Type 2 diabetes mellitus with diabetic chronic kidney disease: Secondary | ICD-10-CM | POA: Diagnosis not present

## 2021-07-28 DIAGNOSIS — N189 Chronic kidney disease, unspecified: Secondary | ICD-10-CM | POA: Diagnosis not present

## 2021-07-28 DIAGNOSIS — E211 Secondary hyperparathyroidism, not elsewhere classified: Secondary | ICD-10-CM | POA: Diagnosis not present

## 2021-07-28 DIAGNOSIS — N19 Unspecified kidney failure: Secondary | ICD-10-CM | POA: Diagnosis not present

## 2021-08-02 DIAGNOSIS — E108 Type 1 diabetes mellitus with unspecified complications: Secondary | ICD-10-CM | POA: Diagnosis not present

## 2021-08-02 DIAGNOSIS — Z794 Long term (current) use of insulin: Secondary | ICD-10-CM | POA: Diagnosis not present

## 2021-08-03 ENCOUNTER — Ambulatory Visit (INDEPENDENT_AMBULATORY_CARE_PROVIDER_SITE_OTHER): Payer: Medicare Other

## 2021-08-03 ENCOUNTER — Ambulatory Visit (INDEPENDENT_AMBULATORY_CARE_PROVIDER_SITE_OTHER): Payer: Medicare Other | Admitting: Podiatry

## 2021-08-03 DIAGNOSIS — M1460 Charcot's joint, unspecified site: Secondary | ICD-10-CM | POA: Diagnosis not present

## 2021-08-03 DIAGNOSIS — R001 Bradycardia, unspecified: Secondary | ICD-10-CM | POA: Diagnosis not present

## 2021-08-03 DIAGNOSIS — M2010 Hallux valgus (acquired), unspecified foot: Secondary | ICD-10-CM

## 2021-08-03 DIAGNOSIS — M14671 Charcot's joint, right ankle and foot: Secondary | ICD-10-CM

## 2021-08-03 DIAGNOSIS — I10 Essential (primary) hypertension: Secondary | ICD-10-CM | POA: Diagnosis not present

## 2021-08-03 DIAGNOSIS — I5032 Chronic diastolic (congestive) heart failure: Secondary | ICD-10-CM | POA: Diagnosis not present

## 2021-08-03 DIAGNOSIS — E039 Hypothyroidism, unspecified: Secondary | ICD-10-CM | POA: Diagnosis not present

## 2021-08-03 DIAGNOSIS — E1122 Type 2 diabetes mellitus with diabetic chronic kidney disease: Secondary | ICD-10-CM | POA: Diagnosis not present

## 2021-08-03 DIAGNOSIS — N183 Chronic kidney disease, stage 3 unspecified: Secondary | ICD-10-CM | POA: Diagnosis not present

## 2021-08-03 NOTE — Progress Notes (Unsigned)
GI Office Note    Referring Provider: Iona Beard, MD Primary Care Physician:  Iona Beard, MD  Primary GI: Dr. Abbey Chatters  Chief Complaint   Chief Complaint  Patient presents with   Colonoscopy     History of Present Illness   [name] is a 56 y.o. female presenting today at the request of Iona Beard, MD for surveillance colonoscopy.   Last colonoscopy 09/22/2017 - single 6 mm polyp in the distal transverse colon, two 2 to 3 mm polyps at the hepatic flexure, redundant left colon.  Pathology revealed tubular adenomas.  She was advised to have repeat in 3 years.  Patient had nurse triage visit 07/21/2021.  Patient stated history of type 2 diabetes, rare alcohol use, does have history of sleep apnea but not using CPAP.  Reported history of gastric bypass in 2020.   Patient has been following regularly with nephrology.  She recently had follow-up with general surgery, was referred to nutritionist. Has a kidney ultrasound scheduled soon as well.   Today: Shortness of breath - no  Chest pain - no Change in bowel habits - no Melena or BRBPR - no Reflux - no, did before her surgery but since then she doesn't Dysphagia - no Lack of appetite - no No early satiety - due to surgery, eats small meals.  Nausea/vomiting - no  Will have increased urine output if she drinks lots of water, tries to drink 4 bottles of water a day.   Had bypass in 2020, going once a day. Has diarrhea if she eats the wrong things. Mostly pasta itself. She states she is learning. Has lost about 100 lbs.   BP usually runs 130/50s - yesterday at primary care office. Reported everything was good. She states since seeing cardiology and follow ups with her PCP her BP has been well managed.   Past Medical History:  Diagnosis Date   Acute asthma exacerbation 12/21/2014   Acute renal failure ([place])    ARF (acute renal failure) ([place]) 05/03/2014   Asthma    Asthma exacerbation 05/03/2014   Asthma, severe  persistent 05/03/2014   Chronic diastolic CHF (congestive heart failure) ([place]) 06/21/2018   Diabetes mellitus    Diabetes mellitus type 2 in obese ([place]) 05/03/2014   History of echocardiogram 10/8919   LVH, diastolic dysfunction   Hyperlipidemia    Hypertension    Hyponatremia 12/02/2014   Obesity    Preop examination 05/18/2017   SOB (shortness of breath) 12/22/2014    Past Surgical History:  Procedure Laterality Date   CARDIAC CATHETERIZATION  03/2013   normal coronary arteries, EF 55%   CARDIAC CATHETERIZATION  03/2013   normal coronary arteries   CATARACT EXTRACTION W/PHACO Right 12/10/2012   Procedure: CATARACT EXTRACTION PHACO AND INTRAOCULAR LENS PLACEMENT (IOC);  Surgeon: Tonny Branch, MD;  Location: [facility];  Service: Ophthalmology;  Laterality: Right;  CDE:22..42   CATARACT EXTRACTION W/PHACO Left 05/11/2015   Procedure: CATARACT EXTRACTION PHACO AND INTRAOCULAR LENS PLACEMENT LEFT EYE CDE=6.54;  Surgeon: Tonny Branch, MD;  Location: [facility];  Service: Ophthalmology;  Laterality: Left;   COLONOSCOPY N/A 09/22/2017   Procedure: COLONOSCOPY;  Surgeon: Danie Binder, MD;  Location: [facility];  Service: Endoscopy;  Laterality: N/A;  12:15   EYE SURGERY Left    GASTRIC BYPASS  05/01/2018   GASTRIC ROUX-EN-Y N/A 05/01/2018   Procedure: LAPAROSCOPIC ROUX-EN-Y GASTRIC BYPASS WITH UPPER ENDOSCOPY WITH ERAS PATHWAY;  Surgeon: Kinsinger, Arta Bruce, MD;  Location:  [facility];  Service: General;  Laterality: N/A;   LEFT HEART CATHETERIZATION WITH CORONARY ANGIOGRAM N/A 04/09/2013   Procedure: LEFT HEART CATHETERIZATION WITH CORONARY ANGIOGRAM;  Surgeon: Laverda Page, MD;  Location[facility] C[facility] LAB;  Service: Cardiovascular;  Laterality: N/A;   TRACHEOSTOMY     at age 63 from asthma attack    Current Outpatient Medications  Medication Sig Dispense Refill   acetaminophen (TYLENOL) 500 MG tablet Take 500 mg by mouth as needed for moderate pain.     albuterol (PROVENTIL HFA;VENTOLIN HFA) 108 (90 BASE)  MCG/ACT inhaler Inhale 2 puffs into the lungs every 6 (six) hours as needed for wheezing.     albuterol (PROVENTIL) (2.5 MG/3ML) 0.083% nebulizer solution Take 3 mLs (2.5 mg total) by nebulization every 6 (six) hours as needed for wheezing or shortness of breath. 75 mL 12   amLODipine (NORVASC) 5 MG tablet TAKE ONE TABLET BY MOUTH ONCE DAILY. 90 tablet 0   atorvastatin (LIPITOR) 20 MG tablet Take 20 mg by mouth daily.      calcitRIOL (ROCALTROL) 0.25 MCG capsule Take 0.25 mcg by mouth as directed.     Calcium Carbonate-Vitamin D (CALCIUM 600+D PO) Take 1 tablet by mouth daily.     carboxymethylcellulose (REFRESH PLUS) 0.5 % SOLN Place 1 drop into both eyes 3 (three) times daily as needed (dry eyes).     carvedilol (COREG) 6.25 MG tablet Take 6.25 mg by mouth 2 (two) times daily.      Cholecalciferol (VITAMIN D) 2000 units tablet Take 2,000 Units by mouth daily.      Fluticasone-Salmeterol (ADVAIR) 100-50 MCG/DOSE AEPB Inhale 1 puff into the lungs 2 (two) times daily.     gabapentin (NEURONTIN) 300 MG capsule Take 1 capsule (300 mg total) by mouth 4 (four) times daily. 120 capsule 2   hydrALAZINE (APRESOLINE) 25 MG tablet Take 1 tablet (25 mg total) by mouth 3 (three) times daily. 90 tablet 3   Insulin Human (INSULIN PUMP) SOLN Inject into the skin.     levothyroxine (SYNTHROID, LEVOTHROID) 75 MCG tablet Take 75 mcg by mouth daily before breakfast.     montelukast (SINGULAIR) 10 MG tablet Take 10 mg by mouth daily.      Multiple Vitamins-Minerals (MULTIVITAMIN WITH MINERALS) tablet Take 1 tablet by mouth daily.     torsemide (DEMADEX) 20 MG tablet Take 20 mg by mouth 2 (two) times a day. Two tablets in morning and one in the evening     No current facility-administered medications for this visit.    Allergies as of 08/04/2021   (No Known Allergies)    Family History  Problem Relation Age of Onset   Diabetes Mother    Asthma Other    Hypertension Other    Hypertension Sister     Social  History   Socioeconomic History   Marital status: Single    Spouse name: Not on file   Number of children: 0   Years of education: Not on file   Highest education level: Not on file  Occupational History   Not on file  Tobacco Use   Smoking status: Never   Smokeless tobacco: Never  Vaping Use   Vaping Use: Never used  Substance and Sexual Activity   Alcohol use: Yes    Comment: occ   Drug use: No   Sexual activity: Yes    Birth control/protection: None  Other Topics Concern   Not on file  Social History Narrative   Not  on file   Social Determinants of Health   Financial Resource Strain: Not on file  Food Insecurity: Not on file  Transportation Needs: Not on file  Physical Activity: Not on file  Stress: Not on file  Social Connections: Not on file  Intimate Partner Violence: Not on file     Review of Systems   Gen: Denies any fever, chills, fatigue, weight loss, lack of appetite.  CV: Denies chest pain, heart palpitations, peripheral edema, syncope.  Resp: Denies shortness of breath at rest or with exertion. Denies wheezing or cough.  GI: See HPI GU : Denies urinary burning, urinary frequency, urinary hesitancy MS: Denies joint pain, muscle weakness, cramps, or limitation of movement.  Derm: Denies rash, itching, dry skin Psych: Denies depression, anxiety, memory loss, and confusion Heme: Denies bruising, bleeding, and enlarged lymph nodes.   Physical Exam   BP 90/60 (BP Location: Right Arm, Patient Position: Sitting, Cuff Size: Large)   Pulse (!) 55   Temp (!) 97.5 F (36.4 C) (Temporal)   Ht 5' 0.5" (1.537 m)   Wt 264 lb 6.4 oz (119.9 kg)   LMP 09/11/2013   SpO2 100%   BMI 50.79 kg/m   General:   Alert and oriented. Pleasant and cooperative. Well-nourished and well-developed.  Head:  Normocephalic and atraumatic. Eyes:  Without icterus, sclera clear and conjunctiva pink.  Ears:  Normal auditory acuity. Mouth:  No deformity or lesions, oral mucosa  pink.  Lungs:  Clear to auscultation bilaterally. No wheezes, rales, or rhonchi. No distress.  Heart:  S1, S2 present without murmurs appreciated.  Abdomen:  +BS, soft, non-tender and non-distended. No HSM noted. No guarding or rebound. No masses appreciated.  Rectal:  Deferred  Msk:  Symmetrical without gross deformities. Normal posture. Extremities:  Non pitting edema to BLE. Peau d'orange appearance. Neurologic:  Alert and  oriented x4;  grossly normal neurologically. Skin:  Intact without significant lesions or rashes. Psych:  Alert and cooperative. Normal mood and affect.   Assessment   SUVI ARCHULETTA is a 56 y.o. female with a history of asthma, sleep apnea, CKD stage III, type 2 diabetes on insulin, HTN, HLD, chronic diastolic heart failure, and obesity s/p gastric bypass 2020 (over 100 pound weight loss) presenting today to schedule surveillance colonoscopy.   History of colon polyps: Last colonoscopy July 2019 with 3 polyps removed, pathology consistent with tubular adenomas.  Patient has history of CKD stage IIIb, diabetes, obesity, and heart failure that put her at high risk for undergoing procedures. Denies GI complaints. No alarm symptoms present. Will proceed with colonoscopy in the near future. Will need PEG based prep due to CKD and low volume/split prep due to gastric bypass.    Chronic diastolic heart failure: Original BP 90/60 and HR 55, recheck at the end of visit was 118/62. Patient with no complaints of shortness of breath, chest pain, edema, dizziness/lightheadedness, nausea, or vision changes. Legs have always been more swollen due to lymphedema and only worsen with long periods of standing or walking.   PLAN   Proceed with colonoscopy by Dr. Abbey Chatters  in near future: the risks, benefits, and alternatives have been discussed with the patient in detail. The patient states understanding and desires to proceed. ASA 3 Trilyte split prep due to gastric bypass. Insulin  pump may remain in place for basal rate.    Venetia Night, MSN, FNP-BC, AGACNP-BC [facility]

## 2021-08-03 NOTE — Progress Notes (Signed)
Subjective:  Patient ID: [name], female    DOB: 01-Jun-1965,  MRN: [id number] HPI Chief Complaint  Patient presents with   Bunions    : Surgical Consultant, painful bunion      56 y.o. female presents with the above complaint.   ROS: Denies fever chills nausea vomiting muscle aches pains calf pain back pain chest pain shortness of breath.  Past Medical History:  Diagnosis Date   Acute asthma exacerbation 12/21/2014   Acute renal failure ([place])    ARF (acute renal failure) ([place]) 05/03/2014   Asthma    Asthma exacerbation 05/03/2014   Asthma, severe persistent 05/03/2014   Chronic diastolic CHF (congestive heart failure) ([place]) 06/21/2018   Diabetes mellitus    Diabetes mellitus type 2 in obese ([place]) 05/03/2014   History of echocardiogram 09/7562   LVH, diastolic dysfunction   Hyperlipidemia    Hypertension    Hyponatremia 12/02/2014   Obesity    Preop examination 05/18/2017   SOB (shortness of breath) 12/22/2014   Past Surgical History:  Procedure Laterality Date   CARDIAC CATHETERIZATION  03/2013   normal coronary arteries, EF 55%   CARDIAC CATHETERIZATION  03/2013   normal coronary arteries   CATARACT EXTRACTION W/PHACO Right 12/10/2012   Procedure: CATARACT EXTRACTION PHACO AND INTRAOCULAR LENS PLACEMENT (IOC);  Surgeon: Tonny Branch, MD;  Location: [facility];  Service: Ophthalmology;  Laterality: Right;  CDE:22..42   CATARACT EXTRACTION W/PHACO Left 05/11/2015   Procedure: CATARACT EXTRACTION PHACO AND INTRAOCULAR LENS PLACEMENT LEFT EYE CDE=6.54;  Surgeon: Tonny Branch, MD;  Location: [facility];  Service: Ophthalmology;  Laterality: Left;   COLONOSCOPY N/A 09/22/2017   Procedure: COLONOSCOPY;  Surgeon: Danie Binder, MD;  [facility];  Service: Endoscopy;  Laterality: N/A;  12:15   EYE SURGERY Left    GASTRIC BYPASS  05/01/2018   GASTRIC ROUX-EN-Y N/A 05/01/2018   Procedure: LAPAROSCOPIC ROUX-EN-Y GASTRIC BYPASS WITH UPPER ENDOSCOPY WITH ERAS PATHWAY;  Surgeon:  Kinsinger, Arta Bruce, MD;  Location: [facility];  Service: General;  Laterality: N/A;   LEFT HEART CATHETERIZATION WITH CORONARY ANGIOGRAM N/A 04/09/2013   Procedure: LEFT HEART CATHETERIZATION WITH CORONARY ANGIOGRAM;  Surgeon: Laverda Page, MD;  Location: [facility] CATH LAB;  Service: Cardiovascular;  Laterality: N/A;   TRACHEOSTOMY     at age 43 from asthma attack    Current Outpatient Medications:    acetaminophen (TYLENOL) 500 MG tablet, Take 500 mg by mouth as needed for moderate pain., Disp: , Rfl:    albuterol (PROVENTIL HFA;VENTOLIN HFA) 108 (90 BASE) MCG/ACT inhaler, Inhale 2 puffs into the lungs every 6 (six) hours as needed for wheezing., Disp: , Rfl:    albuterol (PROVENTIL) (2.5 MG/3ML) 0.083% nebulizer solution, Take 3 mLs (2.5 mg total) by nebulization every 6 (six) hours as needed for wheezing or shortness of breath., Disp: 75 mL, Rfl: 12   amLODipine (NORVASC) 5 MG tablet, TAKE ONE TABLET BY MOUTH ONCE DAILY., Disp: 90 tablet, Rfl: 0   atorvastatin (LIPITOR) 20 MG tablet, Take 20 mg by mouth daily. , Disp: , Rfl:    calcitRIOL (ROCALTROL) 0.25 MCG capsule, Take 0.25 mcg by mouth as directed., Disp: , Rfl:    Calcium Carbonate-Vitamin D (CALCIUM 600+D PO), Take 1 tablet by mouth daily., Disp: , Rfl:    carboxymethylcellulose (REFRESH PLUS) 0.5 % SOLN, Place 1 drop into both eyes 3 (three) times daily as needed (dry eyes)., Disp: , Rfl:    carvedilol (COREG) 6.25 MG tablet, Take  6.25 mg by mouth 2 (two) times daily. , Disp: , Rfl:    Cholecalciferol (VITAMIN D) 2000 units tablet, Take 2,000 Units by mouth daily. , Disp: , Rfl:    Fluticasone-Salmeterol (ADVAIR) 100-50 MCG/DOSE AEPB, Inhale 1 puff into the lungs 2 (two) times daily., Disp: , Rfl:    gabapentin (NEURONTIN) 300 MG capsule, Take 1 capsule (300 mg total) by mouth 4 (four) times daily., Disp: 120 capsule, Rfl: 2   hydrALAZINE (APRESOLINE) 25 MG tablet, Take 1 tablet (25 mg total) by mouth 3 (three) times daily., Disp: 90  tablet, Rfl: 3   Insulin Human (INSULIN PUMP) SOLN, Inject into the skin., Disp: , Rfl:    levothyroxine (SYNTHROID, LEVOTHROID) 75 MCG tablet, Take 75 mcg by mouth daily before breakfast., Disp: , Rfl:    montelukast (SINGULAIR) 10 MG tablet, Take 10 mg by mouth daily. , Disp: , Rfl:    Multiple Vitamins-Minerals (MULTIVITAMIN WITH MINERALS) tablet, Take 1 tablet by mouth daily., Disp: , Rfl:    torsemide (DEMADEX) 20 MG tablet, Take 20 mg by mouth 2 (two) times a day. Two tablets in morning and one in the evening, Disp: , Rfl:   No Known Allergies Review of Systems Objective:  There were no vitals filed for this visit.  General: Well developed, nourished, in no acute distress, alert and oriented x3   Dermatological: Skin is warm, dry and supple bilateral. Nails x 10 are well maintained; remaining integument appears unremarkable at this time. There are no open sores, no preulcerative lesions, no rash or signs of infection present.  Vascular: Dorsalis Pedis artery and Posterior Tibial artery pedal pulses are 2/4 bilateral with immedate capillary fill time. Pedal hair growth present. No varicosities and no lower extremity edema present bilateral.   Neruologic: Grossly intact via light touch bilateral. Vibratory intact via tuning fork bilateral. Protective threshold with Semmes Wienstein monofilament i diminished to all pedal sites bilateral. Patellar and Achilles deep tendon reflexes 2+ bilateral. No Babinski or clonus noted bilateral.   Musculoskeletal: No gross boney pedal deformities bilateral. No pain, crepitus, or limitation noted with foot and ankle range of motion bilateral. Muscular strength 5/5 in all groups tested bilateral.  Fairly significant equinus probably partly to blame for her midfoot pressures and breakdowns.  She has an elevated medially deviated first ray resulting in hallux limitus and pain on palpation to the first metatarsophalangeal joint with attempted range of motion  there is significant discomfort.  Gait: Unassisted, Nonantalgic.    Radiographs:  Radiographs taken today demonstrate an osseously mature individual with severe breakdown of the midfoot secondary to Charcot arthropathy it appears.  Considerable spurring and flattening of the foot with plantar bowing of the foot.  She also has medial deviation of the first metatarsal from the's the lesser metatarsals.  This is most likely secondary to the Charcot arthropathy.  It has not developed dorsiflexion of the first ray as well as plantarflexion of the hallux.  Her posterior calcaneus does not communicate with the ground.  Assessment & Plan:   Assessment: Charcot arthropathy resulting in foot deformity right.  Plan: She will follow-up with [facility] for diabetic shoes.  I expressed to her that trying to correct her deformity would be very risky and that she may be worse off afterwards.  She understands this and is amenable to it as long as we can get her some type of device that would help keep the pressure off of her forefoot.     Lori Bebee T.  [place], [place]

## 2021-08-04 ENCOUNTER — Ambulatory Visit (INDEPENDENT_AMBULATORY_CARE_PROVIDER_SITE_OTHER): Payer: Medicare Other | Admitting: Gastroenterology

## 2021-08-04 ENCOUNTER — Encounter: Payer: Self-pay | Admitting: *Deleted

## 2021-08-04 ENCOUNTER — Telehealth: Payer: Self-pay | Admitting: *Deleted

## 2021-08-04 ENCOUNTER — Encounter: Payer: Self-pay | Admitting: Gastroenterology

## 2021-08-04 VITALS — BP 90/60 | HR 55 | Temp 97.5°F | Ht 60.5 in | Wt 264.4 lb

## 2021-08-04 DIAGNOSIS — Z86010 Personal history of colonic polyps: Secondary | ICD-10-CM | POA: Diagnosis not present

## 2021-08-04 DIAGNOSIS — I5032 Chronic diastolic (congestive) heart failure: Secondary | ICD-10-CM | POA: Diagnosis not present

## 2021-08-04 MED ORDER — PEG 3350-KCL-NA BICARB-NACL 420 G PO SOLR: ORAL | 0 refills | Status: DC

## 2021-08-04 NOTE — Telephone Encounter (Signed)
PA approved via [facility]. Auth# [id number], DOS: 09/27/21-12/26/21

## 2021-08-04 NOTE — Patient Instructions (Signed)
We are scheduling you for colonoscopy in near future with Dr. Abbey Chatters.  Your Dexcom and your insulin pump may remain in place for your procedure.  We will be mailing you prep instructions once procedure scheduled.  We will have you do a split prep the day prior to procedure due to your decreased kidney function and history of gastric bypass.  It was a pleasure to see you today. I want to create trusting relationships with patients. If you receive a survey regarding your visit,  I greatly appreciate you taking time to fill this out on paper or through your MyChart. I value your feedback.  Venetia Night, MSN, FNP-BC, AGACNP-BC [facility]

## 2021-08-11 ENCOUNTER — Telehealth: Payer: Self-pay

## 2021-08-11 NOTE — Telephone Encounter (Signed)
Spoke to pt, she had questions about her prep for her procedure. I went over the instructions with her. Informed her to call office if any more questions.

## 2021-08-11 NOTE — Telephone Encounter (Signed)
Pt left her name, date of birth and phone number to call. Her number is [phone].

## 2021-08-14 ENCOUNTER — Other Ambulatory Visit: Payer: Self-pay | Admitting: Student

## 2021-08-16 ENCOUNTER — Ambulatory Visit: Payer: Medicare Other

## 2021-08-16 ENCOUNTER — Encounter: Payer: Self-pay | Admitting: Student

## 2021-08-16 ENCOUNTER — Ambulatory Visit: Payer: Medicare Other | Admitting: Student

## 2021-08-16 ENCOUNTER — Other Ambulatory Visit (HOSPITAL_COMMUNITY): Payer: Self-pay | Admitting: Nephrology

## 2021-08-16 ENCOUNTER — Other Ambulatory Visit: Payer: Self-pay

## 2021-08-16 VITALS — BP 133/74 | HR 58 | Temp 97.5°F | Resp 16 | Ht 60.0 in | Wt 269.2 lb

## 2021-08-16 DIAGNOSIS — R600 Localized edema: Secondary | ICD-10-CM | POA: Diagnosis not present

## 2021-08-16 DIAGNOSIS — I10 Essential (primary) hypertension: Secondary | ICD-10-CM | POA: Diagnosis not present

## 2021-08-16 DIAGNOSIS — I5032 Chronic diastolic (congestive) heart failure: Secondary | ICD-10-CM

## 2021-08-16 DIAGNOSIS — M1460 Charcot's joint, unspecified site: Secondary | ICD-10-CM

## 2021-08-16 DIAGNOSIS — E669 Obesity, unspecified: Secondary | ICD-10-CM

## 2021-08-16 NOTE — Progress Notes (Signed)
SITUATION Reason for Consult: Evaluation for Prefabricated Diabetic Shoes and Custom Diabetic Inserts. Patient / Caregiver Report: Patient would like well fitting shoes  OBJECTIVE DATA: Patient History / Diagnosis:    ICD-10-CM   1. Diabetes mellitus type 2 in obese (HCC)  E11.69    E66.9     2. Charcot's arthropathy  M14.60       Physician Treating Diabetes:  Iona Beard, MD  Current or Previous Devices:   Historical user  In-Person Foot Examination: Ulcers & Callousing:   N one Deformities:    charcot Sensation:    compromised  Shoe Size:     8.5XXW  ORTHOTIC RECOMMENDATION Recommended Devices: - 1x pair prefabricated PDAC approved diabetic shoes; Patient Selected Orthofeet Francis blue Size 8.5XXW - 3x pair custom-to-patient PDAC approved vacuum formed diabetic insoles.  GOALS OF SHOES AND INSOLES - Reduce shear and pressure - Reduce / Prevent callus formation - Reduce / Prevent ulceration - Protect the fragile healing compromised diabetic foot.  Patient would benefit from diabetic shoes and inserts as patient has diabetes mellitus and the patient has one or more of the following conditions: - History of partial or complete amputation of the foot - History of previous foot ulceration. - History of pre-ulcerative callus - Peripheral neuropathy with evidence of callus formation - Foot deformity - Poor circulation  ACTIONS PERFORMED Potential out of pocket cost was communicated to patient. Patient understood and consented to measurement and casting. Patient was casted for insoles via crush box and measured for shoes via brannock device. Procedure was explained and patient tolerated procedure well. All questions were answered and concerns addressed. CMN sent to treating physicina Casts were shipped to central fabrication for HOLD until Certificate of Medical Necessity or otherwise necessary authorization from insurance is obtained.  PLAN Shoes are to be ordered and casts  released from hold once all appropriate paperwork is complete. Patient is to be contacted and scheduled for fitting once shoes and insoles have been fabricated and received.

## 2021-08-16 NOTE — Progress Notes (Signed)
Primary Physician/Referring:  Iona Beard, MD  Patient ID: [name], female    DOB: 1965-10-26, 55 y.o.   MRN: [id number]  Chief Complaint  Patient presents with   Hypertension   Hyperlipidemia   Leg Swelling   Follow-up    6 months    [name]  is a 56 y.o. 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  Patient was last seen in the office 02/15/2021 at which time no changes were made to antihypertensive medications as patient was stable, repeat BMP revealed improving renal function since stopping valsartan and BNP was only mildly elevated at 144.  Patient now presents for 68-monthfollow-up of hypertension, hyperlipidemia, and leg edema.  Patient is feeling well without specific complaints.  She continues to have chronic bilateral leg edema which remains stable.  Blood pressure is well controlled.  She is currently walking occasionally through her neighborhood without issue.  Denies chest pain, significant dyspnea, palpitations, syncope, near syncope.  Denies orthopnea, PND.  Past Medical History:  Diagnosis Date   Acute asthma exacerbation 12/21/2014   Acute renal failure ([facility]    ARF (acute renal failure) ([facility] 05/03/2014   Asthma    Asthma exacerbation 05/03/2014   Asthma, severe persistent 05/03/2014   Chronic diastolic CHF (congestive heart failure) ([facility] 06/21/2018   Diabetes mellitus    Diabetes mellitus type 2 in obese ([facility] 05/03/2014   History of echocardiogram 33/2440  LVH, diastolic dysfunction   Hyperlipidemia    Hypertension    Hyponatremia 12/02/2014   Obesity    Preop examination 05/18/2017   SOB (shortness of breath) 12/22/2014   Past Surgical History:  Procedure Laterality Date   CARDIAC  CATHETERIZATION  03/2013   normal coronary arteries, EF 55%   CARDIAC CATHETERIZATION  03/2013   normal coronary arteries   CATARACT EXTRACTION W/PHACO Right 12/10/2012   Procedure: CATARACT EXTRACTION PHACO AND INTRAOCULAR LENS PLACEMENT (IOC);  Surgeon: KTonny Branch MD;  Location: [facility];  Service: Ophthalmology;  Laterality: Right;  CDE:22..42   CATARACT EXTRACTION W/PHACO Left 05/11/2015   Procedure: CATARACT EXTRACTION PHACO AND INTRAOCULAR LENS PLACEMENT LEFT EYE CDE=6.54;  Surgeon: KTonny Branch MD;  Location: [facility];  Service: Ophthalmology;  Laterality: Left;   COLONOSCOPY N/A 09/22/2017   Procedure: COLONOSCOPY;  Surgeon: FDanie Binder MD;  Location: [facility];  Service: Endoscopy;  Laterality: N/A;  12:15   EYE SURGERY Left    GASTRIC BYPASS  05/01/2018   GASTRIC ROUX-EN-Y N/A 05/01/2018   Procedure: LAPAROSCOPIC ROUX-EN-Y GASTRIC BYPASS WITH UPPER ENDOSCOPY WITH ERAS PATHWAY;  Surgeon: Kinsinger, LArta Bruce MD;  Location: [facility];  Service: General;  Laterality: N/A;   LEFT HEART CATHETERIZATION WITH CORONARY ANGIOGRAM N/A 04/09/2013   Procedure: LEFT HEART CATHETERIZATION WITH CORONARY ANGIOGRAM;  Surgeon: JLaverda Page MD;  [facility];  Service: Cardiovascular;  Laterality: N/A;   TRACHEOSTOMY     at age [age]from asthma attack   Family History  Problem Relation Age of Onset   Diabetes Mother    Asthma Mother    Bronchitis Mother    Colon polyps Father    Hypertension Sister    Asthma Other    Hypertension Other    Colon cancer Neg Hx    Social History   Tobacco Use  Smoking status: Never   Smokeless tobacco: Never  Substance Use Topics   Alcohol use: Yes    Comment: occ    Allergies  No Known Allergies   Medications Prior to Visit:   Outpatient Medications Prior to Visit  Medication Sig Dispense Refill   acetaminophen (TYLENOL) 500 MG tablet Take 500 mg by mouth as needed for moderate pain.     albuterol (PROVENTIL HFA;VENTOLIN HFA) 108 (90  BASE) MCG/ACT inhaler Inhale 2 puffs into the lungs every 6 (six) hours as needed for wheezing.     albuterol (PROVENTIL) (2.5 MG/3ML) 0.083% nebulizer solution Take 3 mLs (2.5 mg total) by nebulization every 6 (six) hours as needed for wheezing or shortness of breath. 75 mL 12   amLODipine (NORVASC) 5 MG tablet TAKE ONE TABLET BY MOUTH ONCE DAILY. 90 tablet 0   atorvastatin (LIPITOR) 20 MG tablet Take 20 mg by mouth daily.      calcitRIOL (ROCALTROL) 0.25 MCG capsule Take 0.25 mcg by mouth as directed.     Calcium Carbonate-Vitamin D (CALCIUM 600+D PO) Take 1 tablet by mouth daily.     carboxymethylcellulose (REFRESH PLUS) 0.5 % SOLN Place 1 drop into both eyes 3 (three) times daily as needed (dry eyes).     carvedilol (COREG) 6.25 MG tablet Take 6.25 mg by mouth 2 (two) times daily.      Cholecalciferol (VITAMIN D) 2000 units tablet Take 2,000 Units by mouth daily.      Fluticasone-Salmeterol (ADVAIR) 100-50 MCG/DOSE AEPB Inhale 1 puff into the lungs 2 (two) times daily.     gabapentin (NEURONTIN) 300 MG capsule Take 1 capsule (300 mg total) by mouth 4 (four) times daily. 120 capsule 2   hydrALAZINE (APRESOLINE) 25 MG tablet Take 1 tablet (25 mg total) by mouth 3 (three) times daily. 90 tablet 3   Insulin Human (INSULIN PUMP) SOLN Inject into the skin.     levothyroxine (SYNTHROID, LEVOTHROID) 75 MCG tablet Take 75 mcg by mouth daily before breakfast.     montelukast (SINGULAIR) 10 MG tablet Take 10 mg by mouth daily.      Multiple Vitamins-Minerals (MULTIVITAMIN WITH MINERALS) tablet Take 1 tablet by mouth daily.     polyethylene glycol-electrolytes (NULYTELY) 420 g solution As directed 4000 mL 0   torsemide (DEMADEX) 20 MG tablet Take 20 mg by mouth 2 (two) times a day. Two tablets in morning and one in the evening     HUMALOG 100 UNIT/ML injection SMARTSIG:70 Unit(s) SUB-Q Daily     No facility-administered medications prior to visit.   Final Medications at End of Visit    Current Meds   Medication Sig   acetaminophen (TYLENOL) 500 MG tablet Take 500 mg by mouth as needed for moderate pain.   albuterol (PROVENTIL HFA;VENTOLIN HFA) 108 (90 BASE) MCG/ACT inhaler Inhale 2 puffs into the lungs every 6 (six) hours as needed for wheezing.   albuterol (PROVENTIL) (2.5 MG/3ML) 0.083% nebulizer solution Take 3 mLs (2.5 mg total) by nebulization every 6 (six) hours as needed for wheezing or shortness of breath.   amLODipine (NORVASC) 5 MG tablet TAKE ONE TABLET BY MOUTH ONCE DAILY.   atorvastatin (LIPITOR) 20 MG tablet Take 20 mg by mouth daily.    calcitRIOL (ROCALTROL) 0.25 MCG capsule Take 0.25 mcg by mouth as directed.   Calcium Carbonate-Vitamin D (CALCIUM 600+D PO) Take 1 tablet by mouth daily.   carboxymethylcellulose (REFRESH PLUS) 0.5 % SOLN Place 1 drop into both eyes 3 (three) times  daily as needed (dry eyes).   carvedilol (COREG) 6.25 MG tablet Take 6.25 mg by mouth 2 (two) times daily.    Cholecalciferol (VITAMIN D) 2000 units tablet Take 2,000 Units by mouth daily.    Fluticasone-Salmeterol (ADVAIR) 100-50 MCG/DOSE AEPB Inhale 1 puff into the lungs 2 (two) times daily.   gabapentin (NEURONTIN) 300 MG capsule Take 1 capsule (300 mg total) by mouth 4 (four) times daily.   hydrALAZINE (APRESOLINE) 25 MG tablet Take 1 tablet (25 mg total) by mouth 3 (three) times daily.   Insulin Human (INSULIN PUMP) SOLN Inject into the skin.   levothyroxine (SYNTHROID, LEVOTHROID) 75 MCG tablet Take 75 mcg by mouth daily before breakfast.   montelukast (SINGULAIR) 10 MG tablet Take 10 mg by mouth daily.    Multiple Vitamins-Minerals (MULTIVITAMIN WITH MINERALS) tablet Take 1 tablet by mouth daily.   polyethylene glycol-electrolytes (NULYTELY) 420 g solution As directed   torsemide (DEMADEX) 20 MG tablet Take 20 mg by mouth 2 (two) times a day. Two tablets in morning and one in the evening   ROS   Review of Systems  Constitutional: Positive for weight loss (apporximately 100 lbs since  gastric bypass surgery). Negative for malaise/fatigue and weight gain.  Cardiovascular:  Positive for dyspnea on exertion (stable) and leg swelling (chronic, stable). Negative for chest pain, claudication, near-syncope, orthopnea, palpitations, paroxysmal nocturnal dyspnea and syncope.  Respiratory:  Negative for shortness of breath.   Neurological:  Negative for loss of balance.    Objective:  Blood pressure 133/74, pulse (!) 58, temperature (!) 97.5 F (36.4 C), temperature source Temporal, resp. rate 16, height 5' (1.524 m), weight 269 lb 3.2 oz (122.1 kg), last menstrual period 07/29/2013, SpO2 97 %. Body mass index is 52.57 kg/m.     08/16/2021    8:21 AM 08/04/2021    9:06 AM 07/21/2021    3:03 PM  Vitals with BMI  Height '5\' 0"'$  5' 0.5" 5' 0.5"  Weight 269 lbs 3 oz 264 lbs 6 oz 261 lbs  BMI 52.57 45.80 99.83  Systolic 382 90   Diastolic 74 60   Pulse 58 55     Physical Exam Vitals reviewed.  Constitutional:      Appearance: She is well-developed. She is obese.     Comments: Morbidly obese  Neck:     Comments: Short neck and difficult to evaluate JVP Cardiovascular:     Rate and Rhythm: Normal rate and regular rhythm.     Pulses:          Carotid pulses are 2+ on the right side and 2+ on the left side.      Dorsalis pedis pulses are 2+ on the right side and 2+ on the left side.       Posterior tibial pulses are 2+ on the right side and 2+ on the left side.     Heart sounds: Murmur heard.     Midsystolic murmur is present with a grade of 2/6 at the upper right sternal border.     No gallop.     Comments: Femoral and popliteal pulse difficult to feel due to patient's body habitus.  Chronic venostasis bilateral lower legs. Significant nonpitting edema bilaterally.  +1 pitting edema to the midshin bilateral lower extremities. Pulmonary:     Effort: Pulmonary effort is normal.     Breath sounds: Normal breath sounds.  Abdominal:     Comments: Obese. Pannus present        Latest Ref  Rng & Units 03/02/2021    2:40 PM 01/15/2021   11:41 AM 10/30/2020    6:27 PM  CMP  Glucose 70 - 99 mg/dL 87  67  74   BUN 6 - 24 mg/dL 51  46  46   Creatinine 0.57 - 1.00 mg/dL 1.68  2.00  1.81   Sodium 134 - 144 mmol/L 144  142  138   Potassium 3.5 - 5.2 mmol/L 4.1  4.6  3.8   Chloride 96 - 106 mmol/L 106  104  105   CO2 20 - 29 mmol/L '24  23  26   '$ Calcium 8.7 - 10.2 mg/dL 9.2  9.0  8.6   Total Protein 6.5 - 8.1 g/dL   7.7   Total Bilirubin 0.3 - 1.2 mg/dL   0.3   Alkaline Phos 38 - 126 U/L   113   AST 15 - 41 U/L   20   ALT 0 - 44 U/L   20       Latest Ref Rng & Units 10/30/2020    6:27 PM 05/02/2018    5:05 AM 05/01/2018   10:47 AM  CBC  WBC 4.0 - 10.5 K/uL 7.7  15.5    Hemoglobin 12.0 - 15.0 g/dL 11.0  10.4  10.7   Hematocrit 36.0 - 46.0 % 36.0  35.3  35.6   Platelets 150 - 400 K/uL 173  216     Lipid Panel  No results found for: "CHOL", "TRIG", "HDL", "CHOLHDL", "VLDL", "LDLCALC", "LDLDIRECT" HEMOGLOBIN A1C Lab Results  Component Value Date   HGBA1C 6.7 (H) 04/25/2018   MPG 145.59 04/25/2018   External labs: 12/29/2020: Sodium 144, potassium 4.7, BUN 49, creatinine 1.91, GFR 31 Hgb 10.9, HCT 34.3, MCV 84, platelet 179  Cholesterol, total 169.000 M 10/31/2017 HDL 44.000 M 10/31/2017 LDL 134 mg% Triglycerides 44.000 M 10/31/2017  A1C 6.600 11/07/2018; TSH 1.650 07/31/2018  Hemoglobin 10.100 G/ 01/03/2019  Creatinine, Serum 1.720 MG/ 01/03/2019 Potassium 4.500 mm 07/31/2018 ALT (SGPT) 12.000 U/L 05/15/2018  Radiology :  No results found.  Cardiac studies:  PCV ECHOCARDIOGRAM COMPLETE 97/98/9211 Normal LV systolic function with visual EF 55-60%. Left ventricle cavity is normal in size. Normal left ventricular wall thickness. Normal global wall motion. Indeterminate diastolic filling pattern, elevated LAP. Mild tricuspid regurgitation. No evidence of pulmonary hypertension. Compared to study 02/17/2011 no significant change.   US Venous Img Lower Unilateral Left  03/13/2015: No evidence of left lower extremity DVT.  55 Sleep Study 2016: Uses nocturnal O2. Negative sleep study for apnea. On Home O2. Did not tolerate CPAP   Sleep study 2011: Dx'd with sleep apnea-does not use a CPAP.  On Nocturnal O2 therapy.   Coronary angiogram 04/09/2013: Normal coronary arteries, normal left ventricle systolic function. Markedly elevated LVEDP of 31 mmHg.   Echo 02/17/11: No apical views. Grossly preserved LVEF. Moderate LVH, moderate left atrial enlargement.   Lexiscan stress 02/25/11: Mild gut uptake artifact. Normal perfusion. Normal LVEF. Low risk stress    EKG:  08/16/2021: Sinus rhythm rate of 59 bpm.  Normal axis.  Low voltage complexes, consider pulmonary disease pattern.  No evidence of ischemia or underlying injury.  Compared EKG 01/04/2021, no significant change.  05/13/2019: Normal sinus rhythm at rate of 65 bpm, normal axis.  No evidence of ischemia, normal EKG.  Borderline low voltage complexes.  No significant change from 11/12/2018.  Assessment:      ICD-10-CM   1. Essential hypertension  I10 EKG 12-Lead  2. Bilateral leg edema  R60.0     3. Chronic diastolic CHF (congestive heart failure) (HCC)  I50.32        Recommendations:    [name]  is a 56 y.o. 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  Patient was last seen in the office 02/15/2021 at which time no changes were made to antihypertensive medications as patient was stable, repeat BMP revealed improving renal function since stopping valsartan and BNP was only mildly elevated at 144.  Patient now presents for 56-monthfollow-up of hypertension, hyperlipidemia, and leg edema.  Patient is feeling well overall without specific complaints,  essentially remains asymptomatic from a cardiovascular standpoint.  Physical exam and EKG remain unchanged compared to previous, bilateral leg edema is stable.  Blood pressure is well controlled.  She is stable from a cardiovascular standpoint.  Follow-up in 1 year, sooner if needed.   CAlethia Berthold PA-C 08/16/2021, 8:48 AM Office: [phone]

## 2021-08-17 ENCOUNTER — Telehealth: Payer: Self-pay

## 2021-08-17 DIAGNOSIS — J45998 Other asthma: Secondary | ICD-10-CM | POA: Diagnosis not present

## 2021-08-17 NOTE — Telephone Encounter (Signed)
Pt called stating that she has an appt tomorrow and needs to know if she has to be NPO.  Returned pt's call, two identifiers used. Pt read instructions for renal US. Pt confirmed understanding.

## 2021-08-18 ENCOUNTER — Ambulatory Visit (HOSPITAL_COMMUNITY)
Admission: RE | Admit: 2021-08-18 | Discharge: 2021-08-18 | Disposition: A | Discharge status: Home / Self Care | Payer: Medicare Other | Source: Ambulatory Visit | Attending: Nephrology | Admitting: Nephrology

## 2021-08-18 DIAGNOSIS — I10 Essential (primary) hypertension: Secondary | ICD-10-CM | POA: Diagnosis not present

## 2021-08-18 DIAGNOSIS — E108 Type 1 diabetes mellitus with unspecified complications: Secondary | ICD-10-CM | POA: Diagnosis not present

## 2021-08-18 DIAGNOSIS — Z794 Long term (current) use of insulin: Secondary | ICD-10-CM | POA: Diagnosis not present

## 2021-08-20 ENCOUNTER — Other Ambulatory Visit: Payer: Self-pay | Admitting: Cardiology

## 2021-08-20 DIAGNOSIS — I5032 Chronic diastolic (congestive) heart failure: Secondary | ICD-10-CM | POA: Diagnosis not present

## 2021-08-21 LAB — BRAIN NATRIURETIC PEPTIDE: BNP: 105.7 pg/mL — ABNORMAL HIGH (ref 0.0–100.0)

## 2021-08-24 ENCOUNTER — Telehealth: Payer: Self-pay

## 2021-08-24 NOTE — Telephone Encounter (Signed)
Dr. [facility] called in regard to the patient saying they will no longer be responsible of monitoring her BP since she no longer wants to be enrolled in he 16 days program.

## 2021-08-27 DIAGNOSIS — I129 Hypertensive chronic kidney disease with stage 1 through stage 4 chronic kidney disease, or unspecified chronic kidney disease: Secondary | ICD-10-CM | POA: Diagnosis not present

## 2021-08-27 DIAGNOSIS — E1122 Type 2 diabetes mellitus with diabetic chronic kidney disease: Secondary | ICD-10-CM | POA: Diagnosis not present

## 2021-09-02 ENCOUNTER — Encounter: Payer: Medicare Other | Admitting: Cardiology

## 2021-09-02 DIAGNOSIS — Z006 Encounter for examination for normal comparison and control in clinical research program: Secondary | ICD-10-CM

## 2021-09-09 DIAGNOSIS — L609 Nail disorder, unspecified: Secondary | ICD-10-CM | POA: Diagnosis not present

## 2021-09-09 DIAGNOSIS — E1140 Type 2 diabetes mellitus with diabetic neuropathy, unspecified: Secondary | ICD-10-CM | POA: Diagnosis not present

## 2021-09-09 DIAGNOSIS — L110 Acquired keratosis follicularis: Secondary | ICD-10-CM | POA: Diagnosis not present

## 2021-09-10 DIAGNOSIS — Z794 Long term (current) use of insulin: Secondary | ICD-10-CM | POA: Diagnosis not present

## 2021-09-10 DIAGNOSIS — E108 Type 1 diabetes mellitus with unspecified complications: Secondary | ICD-10-CM | POA: Diagnosis not present

## 2021-09-16 DIAGNOSIS — J45998 Other asthma: Secondary | ICD-10-CM | POA: Diagnosis not present

## 2021-09-20 DIAGNOSIS — E108 Type 1 diabetes mellitus with unspecified complications: Secondary | ICD-10-CM | POA: Diagnosis not present

## 2021-09-20 DIAGNOSIS — Z794 Long term (current) use of insulin: Secondary | ICD-10-CM | POA: Diagnosis not present

## 2021-09-21 NOTE — Patient Instructions (Signed)
NEKESHIA LENHARDT  09/21/2021     '@PREFPERIOPPHARMACY'$ @   Your procedure is scheduled on  09/27/2021.   Report to [facility] at  [phone]  A.M.   Call this number if you have problems the morning of surgery:  [phone]   Remember:  Follow the diet and prep instructions given to you by the office.     Take these medicines the morning of surgery with A SIP OF WATER       norvasc, coreg, neurontin, levothyroid, singulair.     Do not wear jewelry, make-up or nail polish.  Do not wear lotions, powders, or perfumes, or deodorant.  Do not shave 48 hours prior to surgery.  Men may shave face and neck.  Do not bring valuables to the hospital.  [facility] is not responsible for any belongings or valuables.  Contacts, dentures or bridgework may not be worn into surgery.  Leave your suitcase in the car.  After surgery it may be brought to your room.  For patients admitted to the hospital, discharge time will be determined by your treatment team.  Patients discharged the day of surgery will not be allowed to drive home and must have someone with them for 24 hours.    Special instructions:   DO NOT smoke tobacco or vape for 24 hours before your procedure.  Please read over the following fact sheets that you were given. Anesthesia Post-op Instructions and Care and Recovery After Surgery      Colonoscopy, Adult, Care After The following information offers guidance on how to care for yourself after your procedure. Your health care provider may also give you more specific instructions. If you have problems or questions, contact your health care provider. What can I expect after the procedure? After the procedure, it is common to have: A small amount of blood in your stool for 24 hours after the procedure. Some gas. Mild cramping or bloating of your abdomen. Follow these instructions at home: Eating and drinking  Drink enough fluid to keep your urine pale yellow. Follow  instructions from your health care provider about eating or drinking restrictions. Resume your normal diet as told by your health care provider. Avoid heavy or fried foods that are hard to digest. Activity Rest as told by your health care provider. Avoid sitting for a long time without moving. Get up to take short walks every 1-2 hours. This is important to improve blood flow and breathing. Ask for help if you feel weak or unsteady. Return to your normal activities as told by your health care provider. Ask your health care provider what activities are safe for you. Managing cramping and bloating  Try walking around when you have cramps or feel bloated. If directed, apply heat to your abdomen as told by your health care provider. Use the heat source that your health care provider recommends, such as a moist heat pack or a heating pad. Place a towel between your skin and the heat source. Leave the heat on for 20-30 minutes. Remove the heat if your skin turns bright red. This is especially important if you are unable to feel pain, heat, or cold. You have a greater risk of getting burned. General instructions If you were given a sedative during the procedure, it can affect you for several hours. Do not drive or operate machinery until your health care provider says that it is safe. For the first 24 hours after the procedure: Do  not sign important documents. Do not drink alcohol. Do your regular daily activities at a slower pace than normal. Eat soft foods that are easy to digest. Take over-the-counter and prescription medicines only as told by your health care provider. Keep all follow-up visits. This is important. Contact a health care provider if: You have blood in your stool 2-3 days after the procedure. Get help right away if: You have more than a small spotting of blood in your stool. You have large blood clots in your stool. You have swelling of your abdomen. You have nausea or  vomiting. You have a fever. You have increasing pain in your abdomen that is not relieved with medicine. These symptoms may be an emergency. Get help right away. Call 911. Do not wait to see if the symptoms will go away. Do not drive yourself to the hospital. Summary After the procedure, it is common to have a small amount of blood in your stool. You may also have mild cramping and bloating of your abdomen. If you were given a sedative during the procedure, it can affect you for several hours. Do not drive or operate machinery until your health care provider says that it is safe. Get help right away if you have a lot of blood in your stool, nausea or vomiting, a fever, or increased pain in your abdomen. This information is not intended to replace advice given to you by your health care provider. Make sure you discuss any questions you have with your health care provider. Document Revised: 10/07/2020 Document Reviewed: 10/07/2020 Elsevier Patient Education  [place] After This sheet gives you information about how to care for yourself after your procedure. Your health care provider may also give you more specific instructions. If you have problems or questions, contact your health care provider. What can I expect after the procedure? After the procedure, it is common to have: Tiredness. Forgetfulness about what happened after the procedure. Impaired judgment for important decisions. Nausea or vomiting. Some difficulty with balance. Follow these instructions at home: For the time period you were told by your health care provider:     Rest as needed. Do not participate in activities where you could fall or become injured. Do not drive or use machinery. Do not drink alcohol. Do not take sleeping pills or medicines that cause drowsiness. Do not make important decisions or sign legal documents. Do not take care of children on your own. Eating and  drinking Follow the diet that is recommended by your health care provider. Drink enough fluid to keep your urine pale yellow. If you vomit: Drink water, juice, or soup when you can drink without vomiting. Make sure you have little or no nausea before eating solid foods. General instructions Have a responsible adult stay with you for the time you are told. It is important to have someone help care for you until you are awake and alert. Take over-the-counter and prescription medicines only as told by your health care provider. If you have sleep apnea, surgery and certain medicines can increase your risk for breathing problems. Follow instructions from your health care provider about wearing your sleep device: Anytime you are sleeping, including during daytime naps. While taking prescription pain medicines, sleeping medicines, or medicines that make you drowsy. Avoid smoking. Keep all follow-up visits as told by your health care provider. This is important. Contact a health care provider if: You keep feeling nauseous or you keep vomiting. You feel  light-headed. You are still sleepy or having trouble with balance after 24 hours. You develop a rash. You have a fever. You have redness or swelling around the IV site. Get help right away if: You have trouble breathing. You have new-onset confusion at home. Summary For several hours after your procedure, you may feel tired. You may also be forgetful and have poor judgment. Have a responsible adult stay with you for the time you are told. It is important to have someone help care for you until you are awake and alert. Rest as told. Do not drive or operate machinery. Do not drink alcohol or take sleeping pills. Get help right away if you have trouble breathing, or if you suddenly become confused. This information is not intended to replace advice given to you by your health care provider. Make sure you discuss any questions you have with your  health care provider. Document Revised: 01/19/2021 Document Reviewed: 01/17/2019 Elsevier Patient Education  [place].

## 2021-09-23 ENCOUNTER — Encounter (HOSPITAL_COMMUNITY)
Admission: RE | Admit: 2021-09-23 | Discharge: 2021-09-23 | Disposition: A | Discharge status: Home / Self Care | Payer: Medicare Other | Source: Ambulatory Visit | Attending: Internal Medicine | Admitting: Internal Medicine

## 2021-09-23 ENCOUNTER — Other Ambulatory Visit: Payer: Self-pay

## 2021-09-23 ENCOUNTER — Encounter (HOSPITAL_COMMUNITY): Payer: Self-pay

## 2021-09-23 VITALS — BP 137/56 | HR 51 | Temp 97.8°F | Resp 18 | Ht 65.0 in | Wt 260.0 lb

## 2021-09-23 DIAGNOSIS — E669 Obesity, unspecified: Secondary | ICD-10-CM | POA: Insufficient documentation

## 2021-09-23 DIAGNOSIS — Z01812 Encounter for preprocedural laboratory examination: Secondary | ICD-10-CM | POA: Insufficient documentation

## 2021-09-23 DIAGNOSIS — E1169 Type 2 diabetes mellitus with other specified complication: Secondary | ICD-10-CM | POA: Insufficient documentation

## 2021-09-23 LAB — BASIC METABOLIC PANEL
Anion gap: 5 (ref 5–15)
BUN: 48 mg/dL — ABNORMAL HIGH (ref 6–20)
CO2: 28 mmol/L (ref 22–32)
Calcium: 8.9 mg/dL (ref 8.9–10.3)
Chloride: 108 mmol/L (ref 98–111)
Creatinine, Ser: 2.35 mg/dL — ABNORMAL HIGH (ref 0.44–1.00)
GFR, Estimated: 24 mL/min — ABNORMAL LOW (ref >60)
Glucose, Bld: 93 mg/dL (ref 70–99)
Potassium: 4 mmol/L (ref 3.5–5.1)
Sodium: 141 mmol/L (ref 135–145)

## 2021-09-23 NOTE — Progress Notes (Signed)
   09/23/21 0844  OBSTRUCTIVE SLEEP APNEA  Have you ever been diagnosed with sleep apnea through a sleep study? No  Do you snore loudly (loud enough to be heard through closed doors)?  0  Do you often feel tired, fatigued, or sleepy during the daytime (such as falling asleep during driving or talking to someone)? 0  Has anyone observed you stop breathing during your sleep? 0  Do you have, or are you being treated for high blood pressure? 1  BMI more than 35 kg/m2? 1  Age > 50 (1-yes) 1  Neck circumference greater than:Female 16 inches or larger, Female 17inches or larger? 1  Female Gender (Yes=1) 0  Obstructive Sleep Apnea Score 4  Score 5 or greater  Results sent to PCP

## 2021-09-27 ENCOUNTER — Ambulatory Visit (HOSPITAL_COMMUNITY): Payer: Medicare Other | Admitting: Anesthesiology

## 2021-09-27 ENCOUNTER — Other Ambulatory Visit: Payer: Self-pay

## 2021-09-27 ENCOUNTER — Encounter (HOSPITAL_COMMUNITY)
Admission: RE | Disposition: A | Discharge status: Home / Self Care | Payer: Self-pay | Source: Home / Self Care | Attending: Internal Medicine

## 2021-09-27 ENCOUNTER — Encounter (HOSPITAL_COMMUNITY): Payer: Self-pay

## 2021-09-27 ENCOUNTER — Ambulatory Visit (HOSPITAL_BASED_OUTPATIENT_CLINIC_OR_DEPARTMENT_OTHER): Payer: Medicare Other | Admitting: Anesthesiology

## 2021-09-27 ENCOUNTER — Ambulatory Visit: Payer: Medicare Other | Admitting: Skilled Nursing Facility1

## 2021-09-27 ENCOUNTER — Ambulatory Visit (HOSPITAL_COMMUNITY)
Admission: RE | Admit: 2021-09-27 | Discharge: 2021-09-27 | Disposition: A | Discharge status: Home / Self Care | Payer: Medicare Other | Attending: Internal Medicine | Admitting: Internal Medicine

## 2021-09-27 DIAGNOSIS — Z86010 Personal history of colonic polyps: Secondary | ICD-10-CM | POA: Diagnosis not present

## 2021-09-27 DIAGNOSIS — Q438 Other specified congenital malformations of intestine: Secondary | ICD-10-CM | POA: Diagnosis not present

## 2021-09-27 DIAGNOSIS — I110 Hypertensive heart disease with heart failure: Secondary | ICD-10-CM | POA: Insufficient documentation

## 2021-09-27 DIAGNOSIS — I5032 Chronic diastolic (congestive) heart failure: Secondary | ICD-10-CM | POA: Insufficient documentation

## 2021-09-27 DIAGNOSIS — E119 Type 2 diabetes mellitus without complications: Secondary | ICD-10-CM | POA: Diagnosis not present

## 2021-09-27 DIAGNOSIS — Z794 Long term (current) use of insulin: Secondary | ICD-10-CM

## 2021-09-27 DIAGNOSIS — I10 Essential (primary) hypertension: Secondary | ICD-10-CM | POA: Diagnosis not present

## 2021-09-27 DIAGNOSIS — E1122 Type 2 diabetes mellitus with diabetic chronic kidney disease: Secondary | ICD-10-CM | POA: Diagnosis not present

## 2021-09-27 DIAGNOSIS — Z09 Encounter for follow-up examination after completed treatment for conditions other than malignant neoplasm: Secondary | ICD-10-CM

## 2021-09-27 DIAGNOSIS — N183 Chronic kidney disease, stage 3 unspecified: Secondary | ICD-10-CM | POA: Diagnosis not present

## 2021-09-27 DIAGNOSIS — N189 Chronic kidney disease, unspecified: Secondary | ICD-10-CM | POA: Diagnosis not present

## 2021-09-27 DIAGNOSIS — I130 Hypertensive heart and chronic kidney disease with heart failure and stage 1 through stage 4 chronic kidney disease, or unspecified chronic kidney disease: Secondary | ICD-10-CM | POA: Diagnosis not present

## 2021-09-27 HISTORY — PX: COLONOSCOPY WITH PROPOFOL: SHX5780

## 2021-09-27 LAB — GLUCOSE, CAPILLARY
Glucose-Capillary: 83 mg/dL (ref 70–99)
Glucose-Capillary: 80 mg/dL (ref 70–99)

## 2021-09-27 SURGERY — COLONOSCOPY WITH PROPOFOL
Anesthesia: General

## 2021-09-27 MED ORDER — PROPOFOL 500 MG/50ML IV EMUL
INTRAVENOUS | Status: DC | PRN
  Administered 2021-09-27: 200 ug/kg/min via INTRAVENOUS

## 2021-09-27 MED ORDER — LIDOCAINE 2% (20 MG/ML) 5 ML SYRINGE
INTRAMUSCULAR | Status: DC | PRN
  Administered 2021-09-27: 50 mg via INTRAVENOUS

## 2021-09-27 MED ORDER — STERILE WATER FOR IRRIGATION IR SOLN
Status: DC | PRN
  Administered 2021-09-27: 60 mL

## 2021-09-27 MED ORDER — PROPOFOL 10 MG/ML IV BOLUS
INTRAVENOUS | Status: DC | PRN
  Administered 2021-09-27: 110 mg via INTRAVENOUS

## 2021-09-27 MED ORDER — LACTATED RINGERS IV SOLN: INTRAVENOUS | Status: DC

## 2021-09-27 NOTE — H&P (Signed)
Primary Care Physician:  Iona Beard, MD Primary Gastroenterologist:  Dr. Abbey Chatters  Pre-Procedure History & Physical: HPI:  [name] is a 56 y.o. female is  here for a colonoscopy to be performed for surveillance purposes. Last colonoscopy 09/22/2017 - single 6 mm polyp in the distal transverse colon, two 2 to 3 mm polyps at the hepatic flexure, redundant left colon.  Pathology revealed tubular adenomas.   Past Medical History:  Diagnosis Date   Acute asthma exacerbation 12/21/2014   Acute renal failure ([place])    ARF (acute renal failure) ([place]) 05/03/2014   Asthma    Asthma exacerbation 05/03/2014   Asthma, severe persistent 05/03/2014   Chronic diastolic CHF (congestive heart failure) ([place]) 06/21/2018   Diabetes mellitus    Diabetes mellitus type 2 in obese ([place]) 05/03/2014   History of echocardiogram 02/2876   LVH, diastolic dysfunction   Hyperlipidemia    Hypertension    Hyponatremia 12/02/2014   Obesity    Preop examination 05/18/2017   SOB (shortness of breath) 12/22/2014    Past Surgical History:  Procedure Laterality Date   CARDIAC CATHETERIZATION  03/2013   normal coronary arteries, EF 55%   CARDIAC CATHETERIZATION  03/2013   normal coronary arteries   CATARACT EXTRACTION W/PHACO Right 12/10/2012   Procedure: CATARACT EXTRACTION PHACO AND INTRAOCULAR LENS PLACEMENT (IOC);  Surgeon: Tonny Branch, MD;  Location: [facility];  Service: Ophthalmology;  Laterality: Right;  CDE:22..42   CATARACT EXTRACTION W/PHACO Left 05/11/2015   Procedure: CATARACT EXTRACTION PHACO AND INTRAOCULAR LENS PLACEMENT LEFT EYE CDE=6.54;  Surgeon: Tonny Branch, MD;  Location: [facility];  Service: Ophthalmology;  Laterality: Left;   COLONOSCOPY N/A 09/22/2017   Procedure: COLONOSCOPY;  Surgeon: Danie Binder, MD;  Location: [facility]ITE;  Service: Endoscopy;  Laterality: N/A;  12:15   EYE SURGERY Left    GASTRIC BYPASS  05/01/2018   GASTRIC ROUX-EN-Y N/A 05/01/2018   Procedure: LAPAROSCOPIC ROUX-EN-Y GASTRIC  BYPASS WITH UPPER ENDOSCOPY WITH ERAS PATHWAY;  Surgeon: Kinsinger, Arta Bruce, MD;  Location: [facility];  Service: General;  Laterality: N/A;   LEFT HEART CATHETERIZATION WITH CORONARY ANGIOGRAM N/A 04/09/2013   Procedure: LEFT HEART CATHETERIZATION WITH CORONARY ANGIOGRAM;  Surgeon: Laverda Page, MD;  Location: [facility] CATH LAB;  Service: Cardiovascular;  Laterality: N/A;   TRACHEOSTOMY     at age 40 from asthma attack    Prior to Admission medications   Medication Sig Start Date End Date Taking? Authorizing Provider  acetaminophen (TYLENOL) 500 MG tablet Take 500 mg by mouth every 6 (six) hours as needed for moderate pain.   Yes [provider]  albuterol (PROVENTIL HFA;VENTOLIN HFA) 108 (90 BASE) MCG/ACT inhaler Inhale 2 puffs into the lungs every 6 (six) hours as needed for wheezing.   Yes [provider]  albuterol (PROVENTIL) (2.5 MG/3ML) 0.083% nebulizer solution Take 3 mLs (2.5 mg total) by nebulization every 6 (six) hours as needed for wheezing or shortness of breath. 12/21/14  Yes Joy, Shawn C, PA-C  amLODipine (NORVASC) 5 MG tablet TAKE ONE TABLET BY MOUTH ONCE DAILY. 12/17/18  Yes Adrian Prows, MD  atorvastatin (LIPITOR) 40 MG tablet Take 40 mg by mouth daily.   Yes [provider]  calcitRIOL (ROCALTROL) 0.25 MCG capsule Take 0.25 mcg by mouth daily.   Yes [provider]  Calcium Carbonate-Vitamin D (CALCIUM 600+D PO) Take 1 tablet by mouth daily.   Yes [provider]  carboxymethylcellulose (REFRESH PLUS) 0.5 % SOLN Place 1 drop  into both eyes 3 (three) times daily as needed (dry eyes).   Yes [provider]  carvedilol (COREG) 6.25 MG tablet Take 6.25 mg by mouth 2 (two) times daily.  11/08/13  Yes [provider]  Cholecalciferol (VITAMIN D) 2000 units tablet Take 2,000 Units by mouth daily.    Yes [provider]  FIBER ADULT GUMMIES PO Take 3 capsules by mouth daily.   Yes [provider]   Fluticasone-Salmeterol (ADVAIR) 100-50 MCG/DOSE AEPB Inhale 1 puff into the lungs daily as needed (shortness of breath).   Yes [provider]  gabapentin (NEURONTIN) 300 MG capsule Take 1 capsule (300 mg total) by mouth 4 (four) times daily. Patient taking differently: Take 300 mg by mouth 4 (four) times daily as needed (pain). 05/13/19  Yes Adrian Prows, MD  HUMALOG 100 UNIT/ML injection Uses in insulin pump 07/19/21  Yes [provider]  hydrALAZINE (APRESOLINE) 25 MG tablet TAKE 1 TABLET BY MOUTH 3 TIMES DAILY. 08/16/21  Yes Cantwell, Celeste C, PA-C  levothyroxine (SYNTHROID, LEVOTHROID) 75 MCG tablet Take 75 mcg by mouth daily before breakfast.   Yes [provider]  Lifitegrast (XIIDRA) 5 % SOLN Place 1 drop into both eyes daily as needed (itchy eyes).   Yes [provider]  montelukast (SINGULAIR) 10 MG tablet Take 10 mg by mouth daily.    Yes [provider]  Multiple Vitamins-Minerals (ADULT GUMMY PO) Take 1 capsule by mouth daily.   Yes [provider]  polyethylene glycol-electrolytes (NULYTELY) 420 g solution As directed 08/04/21  Yes Waldon Sheerin K, DO  torsemide (DEMADEX) 20 MG tablet Take 40 mg by mouth 2 (two) times daily.   Yes [provider]  Insulin Human (INSULIN PUMP) SOLN Inject into the skin.    [provider]    Allergies as of 08/04/2021   (No Known Allergies)    Family History  Problem Relation Age of Onset   Diabetes Mother    Asthma Mother    Bronchitis Mother    Colon polyps Father    Hypertension Sister    Asthma Other    Hypertension Other    Colon cancer Neg Hx     Social History   Socioeconomic History   Marital status: Single    Spouse name: Not on file   Number of children: 0   Years of education: Not on file   Highest education level: Not on file  Occupational History   Not on file  Tobacco Use   Smoking status: Never   Smokeless tobacco: Never  Vaping Use   Vaping  Use: Never used  Substance and Sexual Activity   Alcohol use: Yes    Comment: occ   Drug use: No   Sexual activity: Yes    Birth control/protection: None  Other Topics Concern   Not on file  Social History Narrative   Not on file   Social Determinants of Health   Financial Resource Strain: Not on file  Food Insecurity: No Food Insecurity (05/04/2017)   Hunger Vital Sign    Worried About Running Out of Food in the Last Year: Never true    Ran Out of Food in the Last Year: Never true  Transportation Needs: Not on file  Physical Activity: Not on file  Stress: Not on file  Social Connections: Not on file  Intimate Partner Violence: Not on file    Review of Systems: See HPI, otherwise negative ROS  Physical Exam: Vital signs  in last 24 hours: Temp:  [98 F (36.7 C)] 98 F (36.7 C) (07/31 0655) Pulse Rate:  [50] 50 (07/31 0655) Resp:  [21] 21 (07/31 0655) BP: (143)/(44) 143/44 (07/31 0655) SpO2:  [100 %] 100 % (07/31 0655)   General:   Alert,  Well-developed, well-nourished, pleasant and cooperative in NAD Head:  Normocephalic and atraumatic. Eyes:  Sclera clear, no icterus.   Conjunctiva pink. Ears:  Normal auditory acuity. Nose:  No deformity, discharge,  or lesions. Mouth:  No deformity or lesions, dentition normal. Neck:  Supple; no masses or thyromegaly. Lungs:  Clear throughout to auscultation.   No wheezes, crackles, or rhonchi. No acute distress. Heart:  Regular rate and rhythm; no murmurs, clicks, rubs,  or gallops. Abdomen:  Soft, nontender and nondistended. No masses, hepatosplenomegaly or hernias noted. Normal bowel sounds, without guarding, and without rebound.   Msk:  Symmetrical without gross deformities. Normal posture. Extremities:  Without clubbing or edema. Neurologic:  Alert and  oriented x4;  grossly normal neurologically. Skin:  Intact without significant lesions or rashes. Cervical Nodes:  No significant cervical adenopathy. Psych:  Alert and  cooperative. Normal mood and affect.  Impression/Plan: [name] is here for a colonoscopy to be performed for surveillance purposes. Last colonoscopy 09/22/2017 - single 6 mm polyp in the distal transverse colon, two 2 to 3 mm polyps at the hepatic flexure, redundant left colon.  Pathology revealed tubular adenomas.   The risks of the procedure including infection, bleed, or perforation as well as benefits, limitations, alternatives and imponderables have been reviewed with the patient. Questions have been answered. All parties agreeable.

## 2021-09-27 NOTE — Anesthesia Postprocedure Evaluation (Signed)
Anesthesia Post Note  Patient: RANETTE LUCKADOO  Procedure(s) Performed: COLONOSCOPY WITH PROPOFOL  Patient location during evaluation: Phase II Anesthesia Type: General Level of consciousness: awake Pain management: pain level controlled Vital Signs Assessment: post-procedure vital signs reviewed and stable Respiratory status: spontaneous breathing and respiratory function stable Cardiovascular status: blood pressure returned to baseline and stable Postop Assessment: no headache and no apparent nausea or vomiting Anesthetic complications: no Comments: Late entry   No notable events documented.   Last Vitals:  Vitals:   09/27/21 0843 09/27/21 0849  BP: (!) 80/18 (!) 108/47  Pulse: (!) 51 (!) 51  Resp: 19 (!) 21  Temp: (!) 36.1 C   SpO2: 100% 100%    Last Pain:  Vitals:   09/27/21 0849  TempSrc:   PainSc: 0-No pain                 Louann Sjogren

## 2021-09-27 NOTE — Anesthesia Preprocedure Evaluation (Signed)
Anesthesia Evaluation  Patient identified by MRN, date of birth, ID band Patient awake    Reviewed: Allergy & Precautions, H&P , NPO status , Patient's Chart, lab work & pertinent test results, reviewed documented beta blocker date and time   Airway Mallampati: II  TM Distance: >3 FB Neck ROM: full    Dental no notable dental hx.    Pulmonary asthma ,    Pulmonary exam normal breath sounds clear to auscultation       Cardiovascular Exercise Tolerance: Good hypertension, negative cardio ROS   Rhythm:regular Rate:Normal     Neuro/Psych negative neurological ROS  negative psych ROS   GI/Hepatic negative GI ROS, Neg liver ROS,   Endo/Other  diabetesMorbid obesity  Renal/GU CRFRenal disease  negative genitourinary   Musculoskeletal   Abdominal   Peds  Hematology negative hematology ROS (+)   Anesthesia Other Findings Echocardiogram 01/19/2021:  Normal LV systolic function with visual EF 55-60%. Left ventricle cavity  is normal in size. Normal left ventricular wall thickness. Normal global  wall motion. Indeterminate diastolic filling pattern, elevated LAP.  Mild tricuspid regurgitation. No evidence of pulmonary hypertension.  Compared to study 02/17/2011 no significant change.   Reproductive/Obstetrics negative OB ROS                             Anesthesia Physical Anesthesia Plan  ASA: 3  Anesthesia Plan: General   Post-op Pain Management:    Induction:   PONV Risk Score and Plan: Propofol infusion  Airway Management Planned:   Additional Equipment:   Intra-op Plan:   Post-operative Plan:   Informed Consent: I have reviewed the patients History and Physical, chart, labs and discussed the procedure including the risks, benefits and alternatives for the proposed anesthesia with the patient or authorized representative who has indicated his/her understanding and acceptance.      Dental Advisory Given  Plan Discussed with: CRNA  Anesthesia Plan Comments:         Anesthesia Quick Evaluation

## 2021-09-27 NOTE — Op Note (Signed)
[facility] Patient Name: [name] Procedure Date: 09/27/2021 8:10 AM MRN: [id number] Date of Birth: [birth year] Attending MD: Elon Alas. Abbey Chatters DO CSN: [id number] Age: 56 Admit Type: Outpatient Procedure:                Colonoscopy Indications:              Surveillance: Personal history of adenomatous                            polyps on last colonoscopy > 3 years ago Providers:                Elon Alas. Abbey Chatters, DO, Hughie Closs RN, RN, Caprice Kluver, Thomas Hoff., Technician, Aram Candela Referring MD:              Medicines:                See the Anesthesia note for documentation of the                            administered medications Complications:            No immediate complications. Estimated Blood Loss:     Estimated blood loss: none. Procedure:                Pre-Anesthesia Assessment:                           - The anesthesia plan was to use monitored                            anesthesia care (MAC).                           After obtaining informed consent, the colonoscope                            was passed under direct vision. Throughout the                            procedure, the patient's blood pressure, pulse, and                            oxygen saturations were monitored continuously. The                            [id number]-HQ190L ([id number]) scope was introduced through                            the anus and advanced to the the cecum, identified                            by appendiceal orifice and ileocecal valve. The  colonoscopy was performed without difficulty. The                            patient tolerated the procedure well. The quality                            of the bowel preparation was evaluated using the                            BBPS [facility] Bowel Preparation Scale) with scores                            of: Right Colon = 2 (minor amount of residual                             staining, small fragments of stool and/or opaque                            liquid, but mucosa seen well), Transverse Colon = 2                            (minor amount of residual staining, small fragments                            of stool and/or opaque liquid, but mucosa seen                            well) and Left Colon = 2 (minor amount of residual                            staining, small fragments of stool and/or opaque                            liquid, but mucosa seen well). The total BBPS score                            equals 6. The quality of the bowel preparation was                            fair. Scope In: 8:21:09 AM Scope Out: 8:38:05 AM Scope Withdrawal Time: 0 hours 12 minutes 14 seconds  Total Procedure Duration: 0 hours 16 minutes 56 seconds  Findings:      The perianal and digital rectal examinations were normal.      The sigmoid colon and descending colon were moderately redundant.      The exam was otherwise without abnormality. Impression:               - Preparation of the colon was fair.                           - Redundant colon.                           -  The examination was otherwise normal.                           - No specimens collected. Moderate Sedation:      Per Anesthesia Care Recommendation:           - Patient has a contact number available for                            emergencies. The signs and symptoms of potential                            delayed complications were discussed with the                            patient. Return to normal activities tomorrow.                            Written discharge instructions were provided to the                            patient.                           - Resume previous diet.                           - Continue present medications.                           - Repeat colonoscopy in 5 years for surveillance.                           - Return to GI clinic PRN. Procedure Code(s):         --- Professional ---                           Z9935, Colorectal cancer screening; colonoscopy on                            individual at high risk Diagnosis Code(s):        --- Professional ---                           Z86.010, Personal history of colonic polyps                           Q43.8, Other specified congenital malformations of                            intestine CPT copyright 2019 American Medical Association. All rights reserved. The codes documented in this report are preliminary and upon coder review may  be revised to meet current compliance requirements. Elon Alas. Abbey Chatters, DO [place] Summerlynn Glauser, DO 09/27/2021 8:40:10 AM This report has been signed electronically. Number of Addenda: 0

## 2021-09-27 NOTE — Discharge Instructions (Addendum)
  Colonoscopy Discharge Instructions  Read the instructions outlined below and refer to this sheet in the next few weeks. These discharge instructions provide you with general information on caring for yourself after you leave the hospital. Your doctor may also give you specific instructions. While your treatment has been planned according to the most current medical practices available, unavoidable complications occasionally occur.   ACTIVITY You may resume your regular activity, but move at a slower pace for the next 24 hours.  Take frequent rest periods for the next 24 hours.  Walking will help get rid of the air and reduce the bloated feeling in your belly (abdomen).  No driving for 24 hours (because of the medicine (anesthesia) used during the test).   Do not sign any important legal documents or operate any machinery for 24 hours (because of the anesthesia used during the test).  NUTRITION Drink plenty of fluids.  You may resume your normal diet as instructed by your doctor.  Begin with a light meal and progress to your normal diet. Heavy or fried foods are harder to digest and may make you feel sick to your stomach (nauseated).  Avoid alcoholic beverages for 24 hours or as instructed.  MEDICATIONS You may resume your normal medications unless your doctor tells you otherwise.  WHAT YOU CAN EXPECT TODAY Some feelings of bloating in the abdomen.  Passage of more gas than usual.  Spotting of blood in your stool or on the toilet paper.  IF YOU HAD POLYPS REMOVED DURING THE COLONOSCOPY: No aspirin products for 7 days or as instructed.  No alcohol for 7 days or as instructed.  Eat a soft diet for the next 24 hours.  FINDING OUT THE RESULTS OF YOUR TEST Not all test results are available during your visit. If your test results are not back during the visit, make an appointment with your caregiver to find out the results. Do not assume everything is normal if you have not heard from your  caregiver or the medical facility. It is important for you to follow up on all of your test results.  SEEK IMMEDIATE MEDICAL ATTENTION IF: You have more than a spotting of blood in your stool.  Your belly is swollen (abdominal distention).  You are nauseated or vomiting.  You have a temperature over 101.  You have abdominal pain or discomfort that is severe or gets worse throughout the day.   Your colonoscopy was relatively unremarkable.  I did not find any polyps or evidence of colon cancer.  I recommend repeating colonoscopy in 5 years given your history of polyps in the past.   Follow-up with GI as needed.   I hope you have a great rest of your week!  Elon Alas. Abbey Chatters, D.O. Gastroenterology and Hepatology [facility]

## 2021-09-27 NOTE — Transfer of Care (Signed)
Immediate Anesthesia Transfer of Care Note  Patient: [name]  Procedure(s) Performed: COLONOSCOPY WITH PROPOFOL  Patient Location: Short Stay  Anesthesia Type:MAC  Level of Consciousness: sedated and responds to stimulation  Airway & Oxygen Therapy: Patient Spontanous Breathing  Post-op Assessment: Report given to RN and Post -op Vital signs reviewed and stable  Post vital signs: Reviewed and stable  Last Vitals:  Vitals Value Taken Time  BP    Temp    Pulse    Resp    SpO2      Last Pain:  Vitals:   09/27/21 0820  TempSrc:   PainSc: 0-No pain         Complications: No notable events documented.

## 2021-09-29 ENCOUNTER — Encounter: Payer: Self-pay | Admitting: Cardiology

## 2021-09-29 ENCOUNTER — Other Ambulatory Visit: Payer: Self-pay

## 2021-09-29 DIAGNOSIS — Z006 Encounter for examination for normal comparison and control in clinical research program: Secondary | ICD-10-CM

## 2021-09-29 DIAGNOSIS — I5032 Chronic diastolic (congestive) heart failure: Secondary | ICD-10-CM

## 2021-09-29 DIAGNOSIS — Z95818 Presence of other cardiac implants and grafts: Secondary | ICD-10-CM | POA: Insufficient documentation

## 2021-09-29 NOTE — Progress Notes (Signed)
Chief Complaint  Patient presents with   Research    Patient enrolled into LUX-Sx TRENDS study (loop implantation for heart failure sensor).      Brief History: [name]  is a 56 y.o. 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  She is enrolled in clinical trial for device and potential follow-up, has not had any recent hospitalization for heart failure, dyspnea has remained stable, denies chest pain or leg edema.  She has continued to lose weight.  Physical Exam Constitutional:      Appearance: She is morbidly obese.  Neck:     Vascular: No JVD.  Cardiovascular:     Rate and Rhythm: Normal rate and regular rhythm.     Pulses: Intact distal pulses.     Heart sounds: S1 normal and S2 normal. Murmur heard.     Early systolic murmur is present with a grade of 2/6 at the upper right sternal border.     No gallop.  Pulmonary:     Effort: Pulmonary effort is normal.     Breath sounds: Normal breath sounds.  Abdominal:     General: Bowel sounds are normal.     Palpations: Abdomen is soft.  Musculoskeletal:     Right lower leg: No edema.     Left lower leg: No edema.     Prodedure performed: Insertion of Lux-Dx Printmaker. Serial # [id number]  Indication: Research Patient: Chronic diastolic heart failure After obtaining informed consent, explaining the procedure to the patient, under sterile precautions, local anesthesia with 1% lidocaine with epinephrine was injected subcutaneously in the left parasternal region.  20 mL utilized.  A small nick was made in the left paraspinal region at the 3rd intercostal space. Lux-DX BS loop recorder was inserted without any complications.  Patient tolerated the procedure well.   The incision was closed with a single 4.0 silk suture and Steri-Strips.  There is no blood loss, no hematoma.  Written instrtuctions regarding wound care given to the patient.  Device setting:  R wave amplitude 0.44m.   Programmed:  A. Tach detection 110bpm. AF 4 min HVR 170/min.  Bradycardia 35 BPM Sudden rate drop 50 bpm Asystole 3 Sec Patient Trigger: No    ICD-10-CM   1. Research exam 09/29/2021: Patient enrolled into LUX-Sx TRENDS study (loop implantation for heart failure sensor).   Z00.6     2. Chronic diastolic CHF (congestive heart failure) ([place]  I50.32         Lori Prows MD, [facility]8/03/2021, 10:44 PM Office: [phone]: [phone]: [phone]

## 2021-09-30 ENCOUNTER — Encounter (HOSPITAL_COMMUNITY): Payer: Self-pay | Admitting: Internal Medicine

## 2021-10-01 DIAGNOSIS — R809 Proteinuria, unspecified: Secondary | ICD-10-CM | POA: Diagnosis not present

## 2021-10-01 DIAGNOSIS — N189 Chronic kidney disease, unspecified: Secondary | ICD-10-CM | POA: Diagnosis not present

## 2021-10-01 DIAGNOSIS — E1122 Type 2 diabetes mellitus with diabetic chronic kidney disease: Secondary | ICD-10-CM | POA: Diagnosis not present

## 2021-10-01 DIAGNOSIS — I129 Hypertensive chronic kidney disease with stage 1 through stage 4 chronic kidney disease, or unspecified chronic kidney disease: Secondary | ICD-10-CM | POA: Diagnosis not present

## 2021-10-01 DIAGNOSIS — I5032 Chronic diastolic (congestive) heart failure: Secondary | ICD-10-CM | POA: Diagnosis not present

## 2021-10-01 DIAGNOSIS — E211 Secondary hyperparathyroidism, not elsewhere classified: Secondary | ICD-10-CM | POA: Diagnosis not present

## 2021-10-02 LAB — PRO B NATRIURETIC PEPTIDE: NT-Pro BNP: 886 pg/mL — ABNORMAL HIGH (ref 0–287)

## 2021-10-06 ENCOUNTER — Ambulatory Visit: Payer: Medicare Other | Admitting: Cardiology

## 2021-10-06 DIAGNOSIS — Z4802 Encounter for removal of sutures: Secondary | ICD-10-CM | POA: Diagnosis not present

## 2021-10-06 DIAGNOSIS — Z95818 Presence of other cardiac implants and grafts: Secondary | ICD-10-CM

## 2021-10-06 NOTE — Progress Notes (Signed)
Wound check completed, loop recorder site has healed well.  Steri-Strips are still in place, can be taken off and suture removed today.

## 2021-10-07 DIAGNOSIS — I129 Hypertensive chronic kidney disease with stage 1 through stage 4 chronic kidney disease, or unspecified chronic kidney disease: Secondary | ICD-10-CM | POA: Diagnosis not present

## 2021-10-07 DIAGNOSIS — E870 Hyperosmolality and hypernatremia: Secondary | ICD-10-CM | POA: Diagnosis not present

## 2021-10-07 DIAGNOSIS — E1129 Type 2 diabetes mellitus with other diabetic kidney complication: Secondary | ICD-10-CM | POA: Diagnosis not present

## 2021-10-07 DIAGNOSIS — N189 Chronic kidney disease, unspecified: Secondary | ICD-10-CM | POA: Diagnosis not present

## 2021-10-07 DIAGNOSIS — E1122 Type 2 diabetes mellitus with diabetic chronic kidney disease: Secondary | ICD-10-CM | POA: Diagnosis not present

## 2021-10-07 DIAGNOSIS — E211 Secondary hyperparathyroidism, not elsewhere classified: Secondary | ICD-10-CM | POA: Diagnosis not present

## 2021-10-07 DIAGNOSIS — R809 Proteinuria, unspecified: Secondary | ICD-10-CM | POA: Diagnosis not present

## 2021-10-08 DIAGNOSIS — E108 Type 1 diabetes mellitus with unspecified complications: Secondary | ICD-10-CM | POA: Diagnosis not present

## 2021-10-08 DIAGNOSIS — Z794 Long term (current) use of insulin: Secondary | ICD-10-CM | POA: Diagnosis not present

## 2021-10-17 DIAGNOSIS — J45998 Other asthma: Secondary | ICD-10-CM | POA: Diagnosis not present

## 2021-10-26 ENCOUNTER — Encounter: Payer: Medicare Other | Attending: General Surgery | Admitting: Skilled Nursing Facility1

## 2021-10-26 ENCOUNTER — Encounter: Payer: Self-pay | Admitting: Skilled Nursing Facility1

## 2021-10-26 DIAGNOSIS — Z6841 Body Mass Index (BMI) 40.0 and over, adult: Secondary | ICD-10-CM | POA: Diagnosis present

## 2021-10-26 DIAGNOSIS — E1169 Type 2 diabetes mellitus with other specified complication: Secondary | ICD-10-CM | POA: Diagnosis not present

## 2021-10-26 DIAGNOSIS — E669 Obesity, unspecified: Secondary | ICD-10-CM | POA: Diagnosis present

## 2021-10-26 DIAGNOSIS — N1832 Chronic kidney disease, stage 3b: Secondary | ICD-10-CM | POA: Diagnosis not present

## 2021-10-26 NOTE — Progress Notes (Signed)
Bariatric Follow-Up Visit Medical Nutrition Therapy   Primary Concerns Today: Bariatric Surgery Nutrition Follow Up   Bariatric Surgery Type: RYGB     Surgery Date: 05/01/2018   NUTRITION ASSESSMENT   Anthropometrics  Start weight at [facility]: 351.1 lbs  Today's weight: 258.6 pounds   Body Composition Scale 08/10/2020 09/22/2020 10/26/2021  Weight 267.7 265.5 258.6  BMI 51.4 51 49.2  Total Body Fat % 49.8 49.6 22     Visceral Fat 23 23 50.7  Fat-Free Mass % 50.1 50.3 39.8     Total Body Water % 39.5 39.6 27.8     Muscle-Mass lbs 28.1 28 49.6  Body Fat Displacement            Torso  lbs 82.7 81.7 78.9         Left Leg  lbs 16.5 16.3 15.7         Right Leg  lbs 16.5 16.3 15.7         Left Arm  lbs 8.2 8.1 7.8         Right Arm   lbs 8.2 8.1 7.8    Pt is a poor historian.   Pt states she has been getting out more and traveling more. Pt states since having her insulin adjusted she no longer has low blood sugars.   Pt states her numbers have been about 90-128 Pt states she does struggle to put down the grapes.  Pt states she is doing pretty good with avoiding sweets and candies.  Pt states she wants to lose more weight.  Pt states she is good about stopping at satisfaction rather than fullness.    Medical dx: CKD stage 3, DM type 2, CHF, HTN  LAbs: BUN 38, creatinine 2.02, GFR 29, sodium 145, chloride 109, hemoglobin 11.0, hemoglobin 10.8, hematocrit 32.8  Medications: insulin pump, continuous glucose monitor   24-Hr Dietary Recall First Meal: 2 sausage and brown bread and grits Snack: grapes Second Meal 12:30: baked chicken + [place] dressing + corn and green beans + butter or just grapes  Snack: cheese stick  Third Meal: chicken salad + crackers Snack: cherries or grapes Beverages: plain water, zero calorie soda   Estimated Daily Fluid Intake: 50+ ounces  Estimated Daily Protein Intake:  50-60 g Supplements: bariatric MVI (ran out), calcium, vitamin D and gummy  fiber supplement Current average weekly physical activity: walking 3 minutes a day    Signs/Symptoms  Using straws: no Drinking while eating: no Chewing/swallowing difficulties: no Changes in vision: no Changes to mood/headaches: no Hair loss/changes to skin/nails: no Difficulty focusing/concentrating: no Sweating: no Dizziness/lightheadedness: no Palpitations: no Carbonated/caffeinated beverages: no N/V/D/C/Gas: no Abdominal pain: no Dumping syndrome: no     NUTRITION DIAGNOSIS  Overweight/obesity ([place]-3.3) related to past poor dietary habits and physical inactivity as evidenced by patient w/ completed RYGB surgery following dietary guidelines for continued weight loss and healthy nutrition status within the context of CKD and DM  Previous Handouts: Meal ideas with appropriate portion sizes    NUTRITION INTERVENTION Nutrition counseling (C-1) and education (E-2) to facilitate bariatric surgery goals, including: The importance of consuming adequate calories as well as certain nutrients daily due to the body's need for essential vitamins, minerals, and fats The importance of daily physical activity and to reach a goal of at least 150 minutes of moderate to vigorous physical activity weekly (or as directed by their physician) due to benefits such as increased musculature and improved lab values Encouraged patient to honor their  body's internal hunger and fullness cues.  Throughout the day, check in mentally and rate hunger. Stop eating when satisfied not full regardless of how much food is left on the plate.  Get more if still hungry 20-30 minutes later.  The key is to honor satisfaction so throughout the meal, rate fullness factor and stop when comfortably satisfied not physically full. The key is to honor hunger and fullness without any feelings of guilt or shame.  Pay attention to what the internal cues are, rather than any external factors. This will enhance the confidence you have in  listening to your own body and following those internal cues enabling you to increase how often you eat when you are hungry not out of appetite and stop when you are satisfied not full.  Encouraged pt to continue to eat balanced meals inclusive of non starchy vegetables 2 times a day 7 days a week Encouraged pt to choose lean protein sources: limiting beef, pork, sausage, hotdogs, and lunch meat Encourage pt to choose healthy fats such as plant based limiting animal fats Encouraged pt to continue to drink a minium 64 fluid ounces with half being plain water to satisfy proper hydration  Educated on hypoglycemia correction  Hyperphosphoremia and its consequences    Goals:   -try some workout videos in the house every day to start 10 minutes -if making a smoothie be sure to add a protein and vegetables and do not use juice   Handouts Previously Provided Include  Mindful meals check list Eat this not that phosphorus list of foods   Readiness for Change: contemplating  Demonstrated degree of understanding via: Teach Back     MONITORING & EVALUATION Dietary intake, weekly physical activity, and body weight follow up   Patient is to return to [facility] in 3 months

## 2021-10-29 DIAGNOSIS — E108 Type 1 diabetes mellitus with unspecified complications: Secondary | ICD-10-CM | POA: Diagnosis not present

## 2021-10-29 DIAGNOSIS — Z794 Long term (current) use of insulin: Secondary | ICD-10-CM | POA: Diagnosis not present

## 2021-11-09 DIAGNOSIS — J45909 Unspecified asthma, uncomplicated: Secondary | ICD-10-CM | POA: Diagnosis not present

## 2021-11-09 DIAGNOSIS — I110 Hypertensive heart disease with heart failure: Secondary | ICD-10-CM | POA: Diagnosis not present

## 2021-11-09 DIAGNOSIS — N184 Chronic kidney disease, stage 4 (severe): Secondary | ICD-10-CM | POA: Diagnosis not present

## 2021-11-09 DIAGNOSIS — E1122 Type 2 diabetes mellitus with diabetic chronic kidney disease: Secondary | ICD-10-CM | POA: Diagnosis not present

## 2021-11-09 DIAGNOSIS — R001 Bradycardia, unspecified: Secondary | ICD-10-CM | POA: Diagnosis not present

## 2021-11-09 DIAGNOSIS — I5032 Chronic diastolic (congestive) heart failure: Secondary | ICD-10-CM | POA: Diagnosis not present

## 2021-11-09 DIAGNOSIS — N183 Chronic kidney disease, stage 3 unspecified: Secondary | ICD-10-CM | POA: Diagnosis not present

## 2021-11-09 DIAGNOSIS — Z794 Long term (current) use of insulin: Secondary | ICD-10-CM | POA: Diagnosis not present

## 2021-11-09 DIAGNOSIS — E039 Hypothyroidism, unspecified: Secondary | ICD-10-CM | POA: Diagnosis not present

## 2021-11-10 ENCOUNTER — Other Ambulatory Visit: Payer: Medicare Other

## 2021-11-10 DIAGNOSIS — E108 Type 1 diabetes mellitus with unspecified complications: Secondary | ICD-10-CM | POA: Diagnosis not present

## 2021-11-10 DIAGNOSIS — Z794 Long term (current) use of insulin: Secondary | ICD-10-CM | POA: Diagnosis not present

## 2021-11-11 ENCOUNTER — Ambulatory Visit (INDEPENDENT_AMBULATORY_CARE_PROVIDER_SITE_OTHER): Payer: Medicare Other

## 2021-11-11 DIAGNOSIS — E1165 Type 2 diabetes mellitus with hyperglycemia: Secondary | ICD-10-CM | POA: Diagnosis not present

## 2021-11-11 DIAGNOSIS — M14672 Charcot's joint, left ankle and foot: Secondary | ICD-10-CM | POA: Diagnosis not present

## 2021-11-11 DIAGNOSIS — E1169 Type 2 diabetes mellitus with other specified complication: Secondary | ICD-10-CM

## 2021-11-11 DIAGNOSIS — M14671 Charcot's joint, right ankle and foot: Secondary | ICD-10-CM | POA: Diagnosis not present

## 2021-11-11 DIAGNOSIS — E669 Obesity, unspecified: Secondary | ICD-10-CM

## 2021-11-11 DIAGNOSIS — M1460 Charcot's joint, unspecified site: Secondary | ICD-10-CM

## 2021-11-11 NOTE — Progress Notes (Signed)
Patient presents to the office today with issues concerning the diabetic shoes picked up on 11/11/21.   The shoes feel too tight.  We will send the ortho[employer] brand, style francis blue, size 8.5 x-wide women's back.   Reorder: orthofeet [place] [phone] men's x-wide.   Patient kept insoles for other shoes. Advised to bring back a pair to check fit of reorder.  Patient will be notified for a fitting appointment once the shoes arrive in office.

## 2021-11-15 ENCOUNTER — Other Ambulatory Visit (HOSPITAL_COMMUNITY): Payer: Self-pay | Admitting: Family Medicine

## 2021-11-15 DIAGNOSIS — Z1231 Encounter for screening mammogram for malignant neoplasm of breast: Secondary | ICD-10-CM

## 2021-11-17 DIAGNOSIS — J45998 Other asthma: Secondary | ICD-10-CM | POA: Diagnosis not present

## 2021-11-22 DIAGNOSIS — E108 Type 1 diabetes mellitus with unspecified complications: Secondary | ICD-10-CM | POA: Diagnosis not present

## 2021-11-22 DIAGNOSIS — Z794 Long term (current) use of insulin: Secondary | ICD-10-CM | POA: Diagnosis not present

## 2021-11-29 DIAGNOSIS — Z794 Long term (current) use of insulin: Secondary | ICD-10-CM | POA: Diagnosis not present

## 2021-11-29 DIAGNOSIS — E108 Type 1 diabetes mellitus with unspecified complications: Secondary | ICD-10-CM | POA: Diagnosis not present

## 2021-12-09 ENCOUNTER — Encounter: Payer: Medicare Other | Admitting: Cardiology

## 2021-12-09 DIAGNOSIS — E108 Type 1 diabetes mellitus with unspecified complications: Secondary | ICD-10-CM | POA: Diagnosis not present

## 2021-12-09 DIAGNOSIS — Z794 Long term (current) use of insulin: Secondary | ICD-10-CM | POA: Diagnosis not present

## 2021-12-13 DIAGNOSIS — N189 Chronic kidney disease, unspecified: Secondary | ICD-10-CM | POA: Diagnosis not present

## 2021-12-13 DIAGNOSIS — R809 Proteinuria, unspecified: Secondary | ICD-10-CM | POA: Diagnosis not present

## 2021-12-13 DIAGNOSIS — I129 Hypertensive chronic kidney disease with stage 1 through stage 4 chronic kidney disease, or unspecified chronic kidney disease: Secondary | ICD-10-CM | POA: Diagnosis not present

## 2021-12-13 DIAGNOSIS — E1122 Type 2 diabetes mellitus with diabetic chronic kidney disease: Secondary | ICD-10-CM | POA: Diagnosis not present

## 2021-12-13 DIAGNOSIS — E211 Secondary hyperparathyroidism, not elsewhere classified: Secondary | ICD-10-CM | POA: Diagnosis not present

## 2021-12-14 DIAGNOSIS — R809 Proteinuria, unspecified: Secondary | ICD-10-CM | POA: Diagnosis not present

## 2021-12-14 DIAGNOSIS — E211 Secondary hyperparathyroidism, not elsewhere classified: Secondary | ICD-10-CM | POA: Diagnosis not present

## 2021-12-14 DIAGNOSIS — E1122 Type 2 diabetes mellitus with diabetic chronic kidney disease: Secondary | ICD-10-CM | POA: Diagnosis not present

## 2021-12-14 DIAGNOSIS — E1129 Type 2 diabetes mellitus with other diabetic kidney complication: Secondary | ICD-10-CM | POA: Diagnosis not present

## 2021-12-14 DIAGNOSIS — I129 Hypertensive chronic kidney disease with stage 1 through stage 4 chronic kidney disease, or unspecified chronic kidney disease: Secondary | ICD-10-CM | POA: Diagnosis not present

## 2021-12-14 DIAGNOSIS — N189 Chronic kidney disease, unspecified: Secondary | ICD-10-CM | POA: Diagnosis not present

## 2021-12-16 ENCOUNTER — Encounter: Payer: Self-pay | Admitting: Cardiology

## 2021-12-16 DIAGNOSIS — E1140 Type 2 diabetes mellitus with diabetic neuropathy, unspecified: Secondary | ICD-10-CM | POA: Diagnosis not present

## 2021-12-16 DIAGNOSIS — Z006 Encounter for examination for normal comparison and control in clinical research program: Secondary | ICD-10-CM

## 2021-12-16 DIAGNOSIS — L609 Nail disorder, unspecified: Secondary | ICD-10-CM | POA: Diagnosis not present

## 2021-12-16 DIAGNOSIS — I5032 Chronic diastolic (congestive) heart failure: Secondary | ICD-10-CM

## 2021-12-16 DIAGNOSIS — L110 Acquired keratosis follicularis: Secondary | ICD-10-CM | POA: Diagnosis not present

## 2021-12-18 NOTE — Progress Notes (Signed)
Chief Complaint  Patient presents with   Research    Visit Patient inrolled in LUX-Dx TRENDS study (loop implantation for heart failure sensor).      ICD-10-CM   1. Chronic diastolic CHF (congestive heart failure) (HCC)  I50.32     2. Research exam 09/29/2021: Patient enrolled into LUX-Sx TRENDS study (loop implantation for heart failure sensor).   Z00.6

## 2021-12-27 ENCOUNTER — Encounter (INDEPENDENT_AMBULATORY_CARE_PROVIDER_SITE_OTHER): Payer: Self-pay

## 2021-12-28 DIAGNOSIS — E1122 Type 2 diabetes mellitus with diabetic chronic kidney disease: Secondary | ICD-10-CM | POA: Diagnosis not present

## 2021-12-28 DIAGNOSIS — N1832 Chronic kidney disease, stage 3b: Secondary | ICD-10-CM | POA: Diagnosis not present

## 2021-12-28 DIAGNOSIS — I129 Hypertensive chronic kidney disease with stage 1 through stage 4 chronic kidney disease, or unspecified chronic kidney disease: Secondary | ICD-10-CM | POA: Diagnosis not present

## 2021-12-30 DIAGNOSIS — Z794 Long term (current) use of insulin: Secondary | ICD-10-CM | POA: Diagnosis not present

## 2021-12-30 DIAGNOSIS — E108 Type 1 diabetes mellitus with unspecified complications: Secondary | ICD-10-CM | POA: Diagnosis not present

## 2022-01-03 ENCOUNTER — Encounter: Payer: Self-pay | Admitting: Skilled Nursing Facility1

## 2022-01-03 ENCOUNTER — Telehealth: Payer: Self-pay

## 2022-01-03 ENCOUNTER — Encounter: Payer: Medicare Other | Attending: General Surgery | Admitting: Skilled Nursing Facility1

## 2022-01-03 DIAGNOSIS — N1832 Chronic kidney disease, stage 3b: Secondary | ICD-10-CM | POA: Diagnosis not present

## 2022-01-03 DIAGNOSIS — E1169 Type 2 diabetes mellitus with other specified complication: Secondary | ICD-10-CM | POA: Insufficient documentation

## 2022-01-03 DIAGNOSIS — Z6841 Body Mass Index (BMI) 40.0 and over, adult: Secondary | ICD-10-CM | POA: Insufficient documentation

## 2022-01-03 DIAGNOSIS — E669 Obesity, unspecified: Secondary | ICD-10-CM | POA: Diagnosis present

## 2022-01-03 NOTE — Progress Notes (Signed)
Bariatric Follow-Up Visit Medical Nutrition Therapy   Primary Concerns Today: Bariatric Surgery Nutrition Follow Up   Bariatric Surgery Type: RYGB     Surgery Date: 05/01/2018   NUTRITION ASSESSMENT   Anthropometrics  Start weight at [facility]: 351.1 lbs  Today's weight: pt declined stating her weight is at a stand still  Body Composition Scale 08/10/2020 09/22/2020 10/26/2021  Weight 267.7 265.5 258.6  BMI 51.4 51 49.2  Total Body Fat % 49.8 49.6 22     Visceral Fat 23 23 50.7  Fat-Free Mass % 50.1 50.3 39.8     Total Body Water % 39.5 39.6 27.8     Muscle-Mass lbs 28.1 28 49.6  Body Fat Displacement            Torso  lbs 82.7 81.7 78.9         Left Leg  lbs 16.5 16.3 15.7         Right Leg  lbs 16.5 16.3 15.7         Left Arm  lbs 8.2 8.1 7.8         Right Arm   lbs 8.2 8.1 7.8    Pt is a poor historian.    Pt states she just does not want to gain all of her weight back so as long as she is around 260 something pounds she is happy.  Pt states with the holidays coming up she is baking more and eating more sweets.  Pt state she has a craving for chocolate around the same time every month.  Pt states her blood sugars have been about 80's-90's fasting and post prandial  Pt states her CGM has run out and she  did not put  a new one on due to having a monogram ina  few days so will prick her finger to test.  Pt states if her blood sugar is below 70 she will eat a pack of peanut butter crackers and some sips of a soda.   Pt states her goal is to be 215 pounds. Pt states to reach this gaol she is trying to be more active and with the cold weather it is tough.  Pt state she likes to play bingo.  Pt states her great nephews help her to stay busy so she does not snack as much. Pt states she started using her oven for airfrying instead of deep frying.    Medical dx: CKD stage 3, DM type 2, CHF, HTN  LAbs: BUN 44, creatinine 2.24, GFR 25, sodium 145, chloride 109, hemoglobin 11.0,  hemoglobin 10.8, hematocrit 32.8, phosphorus 4.9  Medications: insulin pump, continuous glucose monitor   24-Hr Dietary Recall First Meal: 2 sausage and brown bread and grits or peanut butter crackers Snack: grapes Second Meal 12:30: baked chicken + [place] dressing + corn and green beans + butter or just grapes  Snack: cheese stick  Third Meal: chicken salad + crackers Snack: cherries or grapes Beverages: plain water, zero calorie soda   Estimated Daily Fluid Intake: 50+ ounces  Estimated Daily Protein Intake:  50-60 g Supplements: bariatric MVI (ran out), calcium, vitamin D and gummy fiber supplement Current average weekly physical activity: walking 3 minutes a day    Signs/Symptoms  Using straws: no Drinking while eating: no Chewing/swallowing difficulties: no Changes in vision: no Changes to mood/headaches: no Hair loss/changes to skin/nails: no Difficulty focusing/concentrating: no Sweating: no Dizziness/lightheadedness: no Palpitations: no Carbonated/caffeinated beverages: no N/V/D/C/Gas: no Abdominal pain: no Dumping syndrome: no  NUTRITION DIAGNOSIS  Overweight/obesity ([place]-3.3) related to past poor dietary habits and physical inactivity as evidenced by patient w/ completed RYGB surgery following dietary guidelines for continued weight loss and healthy nutrition status within the context of CKD and DM  Previous Handouts: Meal ideas with appropriate portion sizes    NUTRITION INTERVENTION: continued Nutrition counseling (C-1) and education (E-2) to facilitate bariatric surgery goals, including: The importance of consuming adequate calories as well as certain nutrients daily due to the body's need for essential vitamins, minerals, and fats The importance of daily physical activity and to reach a goal of at least 150 minutes of moderate to vigorous physical activity weekly (or as directed by their physician) due to benefits such as increased musculature and improved  lab values Encouraged patient to honor their body's internal hunger and fullness cues.  Throughout the day, check in mentally and rate hunger. Stop eating when satisfied not full regardless of how much food is left on the plate.  Get more if still hungry 20-30 minutes later.  The key is to honor satisfaction so throughout the meal, rate fullness factor and stop when comfortably satisfied not physically full. The key is to honor hunger and fullness without any feelings of guilt or shame.  Pay attention to what the internal cues are, rather than any external factors. This will enhance the confidence you have in listening to your own body and following those internal cues enabling you to increase how often you eat when you are hungry not out of appetite and stop when you are satisfied not full.  Encouraged pt to continue to eat balanced meals inclusive of non starchy vegetables 2 times a day 7 days a week Encouraged pt to choose lean protein sources: limiting beef, pork, sausage, hotdogs, and lunch meat Encourage pt to choose healthy fats such as plant based limiting animal fats Encouraged pt to continue to drink a minium 64 fluid ounces with half being plain water to satisfy proper hydration  Educated on hypoglycemia correction  Hyperphosphoremia and its consequences    Goals:   -only eat sweets you made; no store bought sweets -be sure to layer up when walking to encourage you to stay active -great job on working on changes    Handouts Previously Provided Include  Mindful meals check list Eat this not that phosphorus list of foods   Readiness for Change: contemplating  Demonstrated degree of understanding via: Teach Back     MONITORING & EVALUATION Dietary intake, weekly physical activity, and body weight follow up   Patient is to return to [facility] in 3 months

## 2022-01-03 NOTE — Telephone Encounter (Signed)
After hours call:  Patient called and left a message after hours requesting a call back regarding "dropping off Rx at office".  I tried to call patient back, NA, LMAM.

## 2022-01-07 ENCOUNTER — Ambulatory Visit (HOSPITAL_COMMUNITY)
Admission: RE | Admit: 2022-01-07 | Discharge: 2022-01-07 | Disposition: A | Discharge status: Home / Self Care | Payer: Medicare Other | Source: Ambulatory Visit | Attending: Family Medicine | Admitting: Family Medicine

## 2022-01-07 ENCOUNTER — Ambulatory Visit (HOSPITAL_COMMUNITY): Payer: Medicare Other

## 2022-01-07 DIAGNOSIS — Z1231 Encounter for screening mammogram for malignant neoplasm of breast: Secondary | ICD-10-CM | POA: Insufficient documentation

## 2022-01-10 DIAGNOSIS — I10 Essential (primary) hypertension: Secondary | ICD-10-CM | POA: Diagnosis not present

## 2022-01-10 DIAGNOSIS — N184 Chronic kidney disease, stage 4 (severe): Secondary | ICD-10-CM | POA: Diagnosis not present

## 2022-01-10 DIAGNOSIS — Z0000 Encounter for general adult medical examination without abnormal findings: Secondary | ICD-10-CM | POA: Diagnosis not present

## 2022-01-10 DIAGNOSIS — E1122 Type 2 diabetes mellitus with diabetic chronic kidney disease: Secondary | ICD-10-CM | POA: Diagnosis not present

## 2022-01-23 DIAGNOSIS — Z794 Long term (current) use of insulin: Secondary | ICD-10-CM | POA: Diagnosis not present

## 2022-01-23 DIAGNOSIS — E108 Type 1 diabetes mellitus with unspecified complications: Secondary | ICD-10-CM | POA: Diagnosis not present

## 2022-01-30 DIAGNOSIS — Z794 Long term (current) use of insulin: Secondary | ICD-10-CM | POA: Diagnosis not present

## 2022-01-30 DIAGNOSIS — E108 Type 1 diabetes mellitus with unspecified complications: Secondary | ICD-10-CM | POA: Diagnosis not present

## 2022-02-08 DIAGNOSIS — I10 Essential (primary) hypertension: Secondary | ICD-10-CM | POA: Diagnosis not present

## 2022-02-08 DIAGNOSIS — J45909 Unspecified asthma, uncomplicated: Secondary | ICD-10-CM | POA: Diagnosis not present

## 2022-02-08 DIAGNOSIS — E039 Hypothyroidism, unspecified: Secondary | ICD-10-CM | POA: Diagnosis not present

## 2022-02-08 DIAGNOSIS — E1122 Type 2 diabetes mellitus with diabetic chronic kidney disease: Secondary | ICD-10-CM | POA: Diagnosis not present

## 2022-02-08 DIAGNOSIS — I5032 Chronic diastolic (congestive) heart failure: Secondary | ICD-10-CM | POA: Diagnosis not present

## 2022-02-08 DIAGNOSIS — R001 Bradycardia, unspecified: Secondary | ICD-10-CM | POA: Diagnosis not present

## 2022-02-08 DIAGNOSIS — N184 Chronic kidney disease, stage 4 (severe): Secondary | ICD-10-CM | POA: Diagnosis not present

## 2022-02-09 DIAGNOSIS — Z794 Long term (current) use of insulin: Secondary | ICD-10-CM | POA: Diagnosis not present

## 2022-02-09 DIAGNOSIS — E108 Type 1 diabetes mellitus with unspecified complications: Secondary | ICD-10-CM | POA: Diagnosis not present

## 2022-02-11 ENCOUNTER — Other Ambulatory Visit: Payer: Self-pay

## 2022-02-11 MED ORDER — HYDRALAZINE HCL 25 MG PO TABS: 25.0000 mg | ORAL_TABLET | Freq: Three times a day (TID) | ORAL | 1 refills | Status: DC

## 2022-02-22 DIAGNOSIS — Z794 Long term (current) use of insulin: Secondary | ICD-10-CM | POA: Diagnosis not present

## 2022-02-22 DIAGNOSIS — E108 Type 1 diabetes mellitus with unspecified complications: Secondary | ICD-10-CM | POA: Diagnosis not present

## 2022-03-09 ENCOUNTER — Ambulatory Visit: Payer: Medicare Other

## 2022-03-09 DIAGNOSIS — E1169 Type 2 diabetes mellitus with other specified complication: Secondary | ICD-10-CM

## 2022-03-09 NOTE — Progress Notes (Signed)
Patient presents to the office today for diabetic shoe and insole measuring.  Patient was measured with brannock device to determine size and width for 1 pair of extra depth shoes and foam casted for 3 pair of insoles.   Documentation of medical necessity will be sent to patient's treating diabetic doctor to verify and sign.   Patient's diabetic provider: Iona Beard   Shoes and insoles will be ordered at that time and patient will be notified for an appointment for fitting when they arrive.   Shoe size (per patient): 8.5   Brannock measurement: 8.5  Patient shoe selection-   Shoe choice:    [id number] - [employer]pex   Shoe size ordered: 8.5

## 2022-03-10 DIAGNOSIS — E1140 Type 2 diabetes mellitus with diabetic neuropathy, unspecified: Secondary | ICD-10-CM | POA: Diagnosis not present

## 2022-03-10 DIAGNOSIS — L110 Acquired keratosis follicularis: Secondary | ICD-10-CM | POA: Diagnosis not present

## 2022-03-10 DIAGNOSIS — L609 Nail disorder, unspecified: Secondary | ICD-10-CM | POA: Diagnosis not present

## 2022-03-11 DIAGNOSIS — Z794 Long term (current) use of insulin: Secondary | ICD-10-CM | POA: Diagnosis not present

## 2022-03-11 DIAGNOSIS — E108 Type 1 diabetes mellitus with unspecified complications: Secondary | ICD-10-CM | POA: Diagnosis not present

## 2022-03-15 ENCOUNTER — Ambulatory Visit: Payer: Medicare Other | Admitting: Podiatry

## 2022-03-15 DIAGNOSIS — J45998 Other asthma: Secondary | ICD-10-CM | POA: Diagnosis not present

## 2022-03-22 ENCOUNTER — Encounter: Payer: Self-pay | Admitting: Podiatry

## 2022-03-22 ENCOUNTER — Ambulatory Visit (INDEPENDENT_AMBULATORY_CARE_PROVIDER_SITE_OTHER): Payer: 59 | Admitting: Podiatry

## 2022-03-22 VITALS — BP 132/48 | HR 52

## 2022-03-22 DIAGNOSIS — M2041 Other hammer toe(s) (acquired), right foot: Secondary | ICD-10-CM

## 2022-03-22 DIAGNOSIS — M1460 Charcot's joint, unspecified site: Secondary | ICD-10-CM | POA: Diagnosis not present

## 2022-03-22 DIAGNOSIS — E669 Obesity, unspecified: Secondary | ICD-10-CM

## 2022-03-22 DIAGNOSIS — M2042 Other hammer toe(s) (acquired), left foot: Secondary | ICD-10-CM

## 2022-03-22 DIAGNOSIS — E1169 Type 2 diabetes mellitus with other specified complication: Secondary | ICD-10-CM

## 2022-03-22 NOTE — Progress Notes (Signed)
She presents today for diabetic foot exam last A1c was at 6.7 states that time she needs to see about getting her some new shoes.  The last ones that she got did not fit so she only ended up keeping the inserts but never did get any shoes.  Objective: Vital signs are stable alert oriented x 3.  Pulses are palpable.  Neurologic sensorium slightly diminished though she does have severe hammertoe deformities bilateral.  Reactive hyperkeratoses plantar aspect of the forefoot noncomplicated no open lesions or wounds.  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  Assessment: Pain in limb secondary to diabetes mellitus pes planovalgus hammertoe deformity.  Plan: She will follow-up with our orthotic lab for diabetic shoes.

## 2022-03-25 DIAGNOSIS — Z794 Long term (current) use of insulin: Secondary | ICD-10-CM | POA: Diagnosis not present

## 2022-03-25 DIAGNOSIS — E108 Type 1 diabetes mellitus with unspecified complications: Secondary | ICD-10-CM | POA: Diagnosis not present

## 2022-03-25 NOTE — Addendum Note (Signed)
Addended by: Clovis Riley E on: 03/25/2022 12:20 PM   Modules accepted: Orders

## 2022-03-31 ENCOUNTER — Ambulatory Visit: Payer: Medicare Other | Admitting: Cardiology

## 2022-04-01 DIAGNOSIS — N189 Chronic kidney disease, unspecified: Secondary | ICD-10-CM | POA: Diagnosis not present

## 2022-04-01 DIAGNOSIS — E1129 Type 2 diabetes mellitus with other diabetic kidney complication: Secondary | ICD-10-CM | POA: Diagnosis not present

## 2022-04-01 DIAGNOSIS — R809 Proteinuria, unspecified: Secondary | ICD-10-CM | POA: Diagnosis not present

## 2022-04-01 DIAGNOSIS — E1122 Type 2 diabetes mellitus with diabetic chronic kidney disease: Secondary | ICD-10-CM | POA: Diagnosis not present

## 2022-04-01 DIAGNOSIS — I129 Hypertensive chronic kidney disease with stage 1 through stage 4 chronic kidney disease, or unspecified chronic kidney disease: Secondary | ICD-10-CM | POA: Diagnosis not present

## 2022-04-05 ENCOUNTER — Encounter: Payer: 59 | Attending: General Surgery | Admitting: Skilled Nursing Facility1

## 2022-04-05 ENCOUNTER — Encounter: Payer: Self-pay | Admitting: Skilled Nursing Facility1

## 2022-04-05 VITALS — Ht 60.0 in | Wt 250.3 lb

## 2022-04-05 DIAGNOSIS — I129 Hypertensive chronic kidney disease with stage 1 through stage 4 chronic kidney disease, or unspecified chronic kidney disease: Secondary | ICD-10-CM | POA: Diagnosis not present

## 2022-04-05 DIAGNOSIS — E1122 Type 2 diabetes mellitus with diabetic chronic kidney disease: Secondary | ICD-10-CM | POA: Diagnosis not present

## 2022-04-05 DIAGNOSIS — E119 Type 2 diabetes mellitus without complications: Secondary | ICD-10-CM

## 2022-04-05 DIAGNOSIS — Z6841 Body Mass Index (BMI) 40.0 and over, adult: Secondary | ICD-10-CM | POA: Diagnosis present

## 2022-04-05 DIAGNOSIS — R809 Proteinuria, unspecified: Secondary | ICD-10-CM | POA: Diagnosis not present

## 2022-04-05 DIAGNOSIS — N1832 Chronic kidney disease, stage 3b: Secondary | ICD-10-CM

## 2022-04-05 DIAGNOSIS — E1129 Type 2 diabetes mellitus with other diabetic kidney complication: Secondary | ICD-10-CM | POA: Diagnosis not present

## 2022-04-05 DIAGNOSIS — E211 Secondary hyperparathyroidism, not elsewhere classified: Secondary | ICD-10-CM | POA: Diagnosis not present

## 2022-04-05 DIAGNOSIS — N189 Chronic kidney disease, unspecified: Secondary | ICD-10-CM | POA: Diagnosis not present

## 2022-04-05 DIAGNOSIS — N19 Unspecified kidney failure: Secondary | ICD-10-CM | POA: Diagnosis not present

## 2022-04-05 NOTE — Progress Notes (Signed)
Bariatric Follow-Up Visit Medical Nutrition Therapy   Primary Concerns Today: Bariatric Surgery Nutrition Follow Up   Bariatric Surgery Type: RYGB     Surgery Date: 05/01/2018   NUTRITION ASSESSMENT   Anthropometrics  Start weight at [facility]: 351.1 lbs  Today's weight: 250.3 pounds  Body Composition Scale 09/22/2020 10/26/2021 08/04/2022  Weight 265.5 258.6 250.3  BMI 51 49.2 48.8  Total Body Fat % 49.'6 22 22     '$ Visceral Fat 23 50.7 51.1  Fat-Free Mass % 50.3 39.8 40     Total Body Water % 39.6 27.8 27.4     Muscle-Mass lbs 28 49.6 48.7  Body Fat Displacement            Torso  lbs 81.7 78.9 75.8         Left Leg  lbs 16.3 15.7 15.1         Right Leg  lbs 16.3 15.7 15.1         Left Arm  lbs 8.1 7.8 7.5         Right Arm   lbs 8.1 7.8 7.5     Pt states she has been walking in her house with her dumbbells and walks outside when it is warm.  Pt state she has been traveling by train for fun.  Pt states her blood sugars have been about 100 and night around 88: dropped to 49 and will eat peanut butter crackers and orange juice.   Educated pt on hypoglycemia: had pt write out the protocol just to make sure she understood. Also reviewed how she boluses with her pump.  Pt states the girlscout cookies are back.   Medical dx: CKD stage 3, DM type 2, CHF, HTN  LAbs: BUN 45, creatinine 2.08, GFR 28   Medications: insulin pump, continuous glucose monitor   24-Hr Dietary Recall First Meal: sausage and boiled egg and coffee Snack: grapes or half sandwich  Second Meal 12:30: baked chicken + [place] dressing + corn and green beans + butter or just grapes  Snack: cheese stick  Third Meal: chicken salad + crackers or chic fila Snack: cherries or grapes or popcorn and diet pepsi  Beverages: alkaline plain water, zero calorie soda   Estimated Daily Fluid Intake: 66+ ounces  Estimated Daily Protein Intake:  60 g Supplements: bariatric MVI (ran out), calcium, vitamin D and gummy fiber  supplement Current average weekly physical activity: walking 2000 steps    Signs/Symptoms  Using straws: no Drinking while eating: no Chewing/swallowing difficulties: no Changes in vision: no Changes to mood/headaches: no Hair loss/changes to skin/nails: no Difficulty focusing/concentrating: no Sweating: no Dizziness/lightheadedness: no Palpitations: no Carbonated/caffeinated beverages: no N/V/D/C/Gas: no Abdominal pain: no Dumping syndrome: no     NUTRITION DIAGNOSIS  Overweight/obesity ([place]-3.3) related to past poor dietary habits and physical inactivity as evidenced by patient w/ completed RYGB surgery following dietary guidelines for continued weight loss and healthy nutrition status within the context of CKD and DM  Previous Handouts: Meal ideas with appropriate portion sizes    NUTRITION INTERVENTION: continued Nutrition counseling (C-1) and education (E-2) to facilitate bariatric surgery goals, including: The importance of consuming adequate calories as well as certain nutrients daily due to the body's need for essential vitamins, minerals, and fats The importance of daily physical activity and to reach a goal of at least 150 minutes of moderate to vigorous physical activity weekly (or as directed by their physician) due to benefits such as increased musculature and improved lab values  Encouraged patient to honor their body's internal hunger and fullness cues.  Throughout the day, check in mentally and rate hunger. Stop eating when satisfied not full regardless of how much food is left on the plate.  Get more if still hungry 20-30 minutes later.  The key is to honor satisfaction so throughout the meal, rate fullness factor and stop when comfortably satisfied not physically full. The key is to honor hunger and fullness without any feelings of guilt or shame.  Pay attention to what the internal cues are, rather than any external factors. This will enhance the confidence you have in  listening to your own body and following those internal cues enabling you to increase how often you eat when you are hungry not out of appetite and stop when you are satisfied not full.  Encouraged pt to continue to eat balanced meals inclusive of non starchy vegetables 2 times a day 7 days a week Encouraged pt to choose lean protein sources: limiting beef, pork, sausage, hotdogs, and lunch meat Encourage pt to choose healthy fats such as plant based limiting animal fats Encouraged pt to continue to drink a minium 64 fluid ounces with half being plain water to satisfy proper hydration  Educated on hypoglycemia correction  Hyperphosphoremia and its consequences     Handouts Previously Provided Include  Mindful meals check list Eat this not that phosphorus list of foods   Readiness for Change: contemplating  Demonstrated degree of understanding via: Teach Back     MONITORING & EVALUATION Dietary intake, weekly physical activity, and body weight follow up   Patient is to return to [facility] in 3 months with Leonia Reader RN or Antonieta Iba RD for a pump refresher

## 2022-04-13 DIAGNOSIS — E108 Type 1 diabetes mellitus with unspecified complications: Secondary | ICD-10-CM | POA: Diagnosis not present

## 2022-04-13 DIAGNOSIS — Z794 Long term (current) use of insulin: Secondary | ICD-10-CM | POA: Diagnosis not present

## 2022-04-20 ENCOUNTER — Encounter: Payer: Self-pay | Admitting: Cardiology

## 2022-04-20 ENCOUNTER — Ambulatory Visit: Payer: 59 | Admitting: Cardiology

## 2022-04-20 VITALS — BP 145/68 | HR 51 | Ht 60.0 in | Wt 259.4 lb

## 2022-04-20 DIAGNOSIS — I5032 Chronic diastolic (congestive) heart failure: Secondary | ICD-10-CM

## 2022-04-20 DIAGNOSIS — R0989 Other specified symptoms and signs involving the circulatory and respiratory systems: Secondary | ICD-10-CM | POA: Diagnosis not present

## 2022-04-20 DIAGNOSIS — N184 Chronic kidney disease, stage 4 (severe): Secondary | ICD-10-CM | POA: Diagnosis not present

## 2022-04-20 DIAGNOSIS — E1122 Type 2 diabetes mellitus with diabetic chronic kidney disease: Secondary | ICD-10-CM | POA: Diagnosis not present

## 2022-04-20 DIAGNOSIS — I129 Hypertensive chronic kidney disease with stage 1 through stage 4 chronic kidney disease, or unspecified chronic kidney disease: Secondary | ICD-10-CM | POA: Diagnosis not present

## 2022-04-20 DIAGNOSIS — Z794 Long term (current) use of insulin: Secondary | ICD-10-CM | POA: Diagnosis not present

## 2022-04-20 DIAGNOSIS — Z006 Encounter for examination for normal comparison and control in clinical research program: Secondary | ICD-10-CM

## 2022-04-20 DIAGNOSIS — I10 Essential (primary) hypertension: Secondary | ICD-10-CM

## 2022-04-20 MED ORDER — SEMAGLUTIDE(0.25 OR 0.5MG/DOS) 2 MG/3ML ~~LOC~~ SOPN
0.2500 mg | PEN_INJECTOR | SUBCUTANEOUS | Status: DC
  Administered 2022-04-20: 0.25 mg via SUBCUTANEOUS

## 2022-04-20 NOTE — Progress Notes (Unsigned)
Primary Physician/Referring:  Iona Beard, MD  Patient ID: [name], female    DOB: March 08, 1965, 57 y.o.   MRN: [id number]  Chief Complaint  Patient presents with   Congestive Heart Failure   Follow-up   Weight Gain         [name]  is a 57 y.o. 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  Patient is feeling well without specific complaints.  She continues to have chronic bilateral leg edema which remains stable.  Blood pressure is well controlled.  She is currently walking occasionally through her neighborhood without issue.  Denies chest pain, significant dyspnea, palpitations, syncope, near syncope.  Denies orthopnea, PND.  Patient states that she is still continues to have difficulty with weight loss.  Past Medical History:  Diagnosis Date   Acute asthma exacerbation 12/21/2014   Acute renal failure ([place])    ARF (acute renal failure) ([name]) 05/03/2014   Asthma    Asthma exacerbation 05/03/2014   Asthma, severe persistent 05/03/2014   Chronic diastolic CHF (congestive heart failure) ([place]) 06/21/2018   Diabetes mellitus    Diabetes mellitus type 2 in obese ([place]) 05/03/2014   History of echocardiogram [id number]   LVH, diastolic dysfunction   Hyperlipidemia    Hypertension    Hyponatremia 12/02/2014   Obesity    Preop examination 05/18/2017   SOB (shortness of breath) 12/22/2014   Past Surgical History:  Procedure Laterality Date   CARDIAC CATHETERIZATION  03/2013   normal coronary arteries, EF 55%   CARDIAC CATHETERIZATION  03/2013   normal coronary arteries   CATARACT EXTRACTION W/PHACO Right 12/10/2012   Procedure: CATARACT EXTRACTION PHACO AND INTRAOCULAR LENS PLACEMENT ([place]);  Surgeon: Lori Branch, MD;  Location:  [facility];  Service: Ophthalmology;  Laterality: Right;  CDE:22..42   CATARACT EXTRACTION W/PHACO Left 05/11/2015   Procedure: CATARACT EXTRACTION PHACO AND INTRAOCULAR LENS PLACEMENT LEFT EYE CDE=6.54;  Surgeon: Lori Branch, MD;  Location: [facility];  Service: Ophthalmology;  Laterality: Left;   COLONOSCOPY N/A 09/22/2017   Procedure: COLONOSCOPY;  Surgeon: Lori Binder, MD;  Location: [facility];  Service: Endoscopy;  Laterality: N/A;  12:15   COLONOSCOPY WITH PROPOFOL N/A 09/27/2021   Procedure: COLONOSCOPY WITH PROPOFOL;  Surgeon: Lori Harman, DO;  Location: [facility];  Service: Endoscopy;  Laterality: N/A;  8:15am   EYE SURGERY Left    GASTRIC BYPASS  05/01/2018   GASTRIC ROUX-EN-Y N/A 05/01/2018   Procedure: LAPAROSCOPIC ROUX-EN-Y GASTRIC BYPASS WITH UPPER ENDOSCOPY WITH ERAS PATHWAY;  Surgeon: Kinsinger, Arta Bruce, MD;  Location: [facility];  Service: General;  Laterality: N/A;   LEFT HEART CATHETERIZATION WITH CORONARY ANGIOGRAM N/A 04/09/2013   Procedure: LEFT HEART CATHETERIZATION WITH CORONARY ANGIOGRAM;  Surgeon: Laverda Page, MD;  Location: [facility] CATH LAB;  Service: Cardiovascular;  Laterality: N/A;   Loop recorder implantation     Insertion of [employer]. Serial # [id number] 09/29/21 for heart failure research   TRACHEOSTOMY     at age 61 from asthma attack   Family History  Problem Relation Age of Onset   Diabetes Mother    Asthma Mother    Bronchitis Mother    Colon polyps Father    Hypertension Sister    Asthma Other    Hypertension Other  Colon cancer Neg Hx    Social History   Tobacco Use   Smoking status: Never   Smokeless tobacco: Never  Substance Use Topics   Alcohol use: Yes    Comment: occ    Allergies  No Known Allergies   Final Medications at End of Visit    Current Outpatient Medications:    acetaminophen (TYLENOL) 500 MG tablet, Take 500 mg by mouth every 6 (six) hours as needed for moderate pain., Disp: , Rfl:     albuterol (PROVENTIL HFA;VENTOLIN HFA) 108 (90 BASE) MCG/ACT inhaler, Inhale 2 puffs into the lungs every 6 (six) hours as needed for wheezing., Disp: , Rfl:    albuterol (PROVENTIL) (2.5 MG/3ML) 0.083% nebulizer solution, Take 3 mLs (2.5 mg total) by nebulization every 6 (six) hours as needed for wheezing or shortness of breath., Disp: 75 mL, Rfl: 12   amLODipine (NORVASC) 5 MG tablet, TAKE ONE TABLET BY MOUTH ONCE DAILY., Disp: 90 tablet, Rfl: 0   atorvastatin (LIPITOR) 40 MG tablet, Take 40 mg by mouth daily., Disp: , Rfl:    calcitRIOL (ROCALTROL) 0.25 MCG capsule, Take 0.25 mcg by mouth daily., Disp: , Rfl:    Calcium Carbonate-Vitamin D (CALCIUM 600+D PO), Take 1 tablet by mouth daily., Disp: , Rfl:    carboxymethylcellulose (REFRESH PLUS) 0.5 % SOLN, Place 1 drop into both eyes 3 (three) times daily as needed (dry eyes)., Disp: , Rfl:    carvedilol (COREG) 6.25 MG tablet, Take 6.25 mg by mouth 2 (two) times daily. , Disp: , Rfl:    Cholecalciferol (VITAMIN D) 2000 units tablet, Take 2,000 Units by mouth daily. , Disp: , Rfl:    FARXIGA 5 MG TABS tablet, Take 5 mg by mouth daily., Disp: , Rfl:    FIBER ADULT GUMMIES PO, Take 3 capsules by mouth daily., Disp: , Rfl:    Fluticasone-Salmeterol (ADVAIR) 100-50 MCG/DOSE AEPB, Inhale 1 puff into the lungs daily as needed (shortness of breath)., Disp: , Rfl:    gabapentin (NEURONTIN) 300 MG capsule, Take 1 capsule (300 mg total) by mouth 4 (four) times daily. (Patient taking differently: Take 300 mg by mouth 4 (four) times daily as needed (pain).), Disp: 120 capsule, Rfl: 2   HUMALOG 100 UNIT/ML injection, Uses in insulin pump, Disp: , Rfl:    hydrALAZINE (APRESOLINE) 25 MG tablet, Take 1 tablet (25 mg total) by mouth 3 (three) times daily., Disp: 270 tablet, Rfl: 1   Insulin Human (INSULIN PUMP) SOLN, Inject into the skin., Disp: , Rfl:    levothyroxine (SYNTHROID, LEVOTHROID) 75 MCG tablet, Take 75 mcg by mouth daily before breakfast., Disp: , Rfl:     Lifitegrast (XIIDRA) 5 % SOLN, Place 1 drop into both eyes daily as needed (itchy eyes)., Disp: , Rfl:    montelukast (SINGULAIR) 10 MG tablet, Take 10 mg by mouth daily. , Disp: , Rfl:    Multiple Vitamins-Minerals (ADULT GUMMY PO), Take 1 capsule by mouth daily., Disp: , Rfl:    torsemide (DEMADEX) 20 MG tablet, Take 40 mg by mouth 2 (two) times daily., Disp: , Rfl:   Current Facility-Administered Medications:    Semaglutide(0.25 or 0.5MG/DOS) SOPN 0.25 mg, 0.25 mg, Subcutaneous, Weekly, Adrian Prows, MD   ROS   Review of Systems  Cardiovascular:  Negative for chest pain, dyspnea on exertion and leg swelling.    Objective:  Blood pressure (!) 145/68, pulse (!) 51, height 5' (1.524 m), weight 259 lb 6.4 oz (117.7 kg), last menstrual period 07/29/2013,  SpO2 98 %. Body mass index is 50.66 kg/m.     04/20/2022    2:45 PM 04/20/2022    2:33 PM 04/05/2022    8:34 AM  Vitals with BMI  Height  5' 0"$  5' 0"$   Weight  259 lbs 6 oz 250 lbs 5 oz  BMI  [id number]  Systolic [id number]   Diastolic 68 53   Pulse 51 52     Physical Exam Vitals reviewed.  Constitutional:      Appearance: She is well-developed. She is morbidly obese.     Comments: Morbidly obese  Neck:     Vascular: Carotid bruit (bilateral) present. No JVD.     Comments: Short neck and difficult to evaluate JVP Cardiovascular:     Rate and Rhythm: Normal rate and regular rhythm.     Pulses: Normal pulses and intact distal pulses.          Dorsalis pedis pulses are 2+ on the right side and 2+ on the left side.       Posterior tibial pulses are 2+ on the right side and 2+ on the left side.     Heart sounds: Murmur heard.     Midsystolic murmur is present with a grade of 2/6 at the upper right sternal border.     No gallop.  Pulmonary:     Effort: Pulmonary effort is normal.     Breath sounds: Normal breath sounds.  Abdominal:     General: Bowel sounds are normal.     Palpations: Abdomen is soft.     Comments: Obese.  Pannus present  Musculoskeletal:     Right lower leg: No edema.     Left lower leg: No edema.    Lab Results  Component Value Date   NA 141 09/23/2021   K 4.0 09/23/2021   CO2 28 09/23/2021   GLUCOSE 93 09/23/2021   BUN 48 (H) 09/23/2021   CREATININE 2.35 (H) 09/23/2021   CALCIUM 8.9 09/23/2021   EGFR 36 (L) 03/02/2021   GFRNONAA 24 (L) 09/23/2021       Latest Ref Rng & Units 09/23/2021    8:45 AM 03/02/2021    2:40 PM 01/15/2021   11:41 AM  CMP  Glucose 70 - 99 mg/dL 93  87  67   BUN 6 - 20 mg/dL 48  51  46   Creatinine 0.44 - 1.00 mg/dL 2.35  1.68  2.00   Sodium 135 - 145 mmol/L 141  144  142   Potassium 3.5 - 5.1 mmol/L 4.0  4.1  4.6   Chloride 98 - 111 mmol/L 108  106  104   CO2 22 - 32 mmol/L 28  24  23   $ Calcium 8.9 - 10.3 mg/dL 8.9  9.2  9.0       Latest Ref Rng & Units 10/30/2020    6:27 PM 05/02/2018    5:05 AM 05/01/2018   10:47 AM  CBC  WBC 4.0 - 10.5 K/uL 7.7  15.5    Hemoglobin 12.0 - 15.0 g/dL 11.0  10.4  10.7   Hematocrit 36.0 - 46.0 % 36.0  35.3  35.6   Platelets 150 - 400 K/uL 173  216      External labs:  Cholesterol, total 156.000 m 08/03/2021 HDL 58.000 mg 08/03/2021 LDL 87.000 mg 08/03/2021 Triglycerides 38.000 mg 08/03/2021  A1C 6.400 02/09/2022 TSH 2.780 02/08/2022  Hemoglobin 11.000 g/d 10/30/2020  Creatinine, Serum 2.160 mg/ 02/08/2022 Potassium 4.300 mm  02/08/2022 ALT (SGPT) 14.000 U/L 02/08/2022  BNP 105.700 p 08/20/2021   12/29/2020: Sodium 144, potassium 4.7, BUN 49, creatinine 1.91, GFR 31 Hgb 10.9, HCT 34.3, MCV 84, platelet 179  Radiology :  No results found.  Cardiac studies:   Sleep Study 2016: Negative sleep study for apnea.    Coronary angiogram 04/09/2013: Normal coronary arteries, normal left ventricle systolic function. Markedly elevated LVEDP of 31 mmHg.  PCV ECHOCARDIOGRAM COMPLETE 01/19/2021  Narrative Echocardiogram 01/19/2021: Normal LV systolic function with visual EF 55-60%. Left ventricle cavity is normal in  size. Normal left ventricular wall thickness. Normal global wall motion. Indeterminate diastolic filling pattern, elevated LAP. Mild tricuspid regurgitation. No evidence of pulmonary hypertension. Compared to study 02/17/2011 no significant change.      Renal artery duplex  08/18/2021: Right: Normal size right kidney. No visualized stenosis in segments        visualized, limited visualization. Left:  Normal size of left kidney. No visualized stenosis in        segments visualized, limited visualization.  EKG:   EKG 04/20/2022: Sinus bradycardia at the rate of 52 bpm, otherwise normal EKG.  Compared to 08/16/2021, no significant change.  Assessment:      ICD-10-CM   1. Chronic diastolic CHF (congestive heart failure) (HCC)  I50.32 EKG 12-Lead    2. Type 2 diabetes mellitus with stage 4 chronic kidney disease, with long-term current use of insulin (HCC)  E11.22 Semaglutide(0.25 or 0.5MG/DOS) SOPN 0.25 mg   N18.4    Z79.4     3. Essential hypertension  I10     4. Bilateral carotid bruits  R09.89 PCV CAROTID DUPLEX (BILATERAL)    5. Research exam 09/29/2021: Patient enrolled into LUX-Sx TRENDS study (loop implantation for heart failure sensor).   Z00.6        Administrations This Visit     Semaglutide(0.25 or 0.5MG/DOS) SOPN 0.25 mg     Admin Date 04/20/2022 Action Given Dose 0.25 mg Route Subcutaneous Administered By Matthew Saras, LPN             Recommendations:    KAWANIS BONETTI  is a 57 y.o. 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  1. Chronic diastolic CHF (congestive heart failure) ([place]) Patient with chronic diastolic heart failure, fortunately has not had any recent hospitalization. Presently on Farxiga,  hydralazine, carvedilol and torsemide.  Not on ACE inhibitors or ARB due to stage IV chronic kidney disease.  2. Type 2 diabetes mellitus with stage 4 chronic kidney disease, with long-term current use of insulin (HCC) In view of diabetes, stage IV chronic kidney disease, weight loss would be optimal, however patient has had difficulty with this.  I would like to try Ozempic and see whether this would also aid with weight loss.  First dose given in the office today.  3. Essential hypertension Blood pressure is elevated, external renal duplex reviewed, although poor study no evidence of renal artery stenosis.  Suspect her hypotension is related to obesity.  I did not make any changes to her blood pressure medications however hopefully with weight loss blood pressure will improve.  I would like to see her back in 4 weeks for follow-up of hypertension and weight loss.  4. Bilateral carotid bruits She has bilateral carotid artery bruit that is new, will obtain  carotid duplex.  5. Research 09/29/2021: Patient enrolled into LUX-Sx TRENDS study (loop implantation for heart failure sensor).  Patient is enrolled in clinical trial and has a loop recorder in place.    Adrian Prows, PA-C 04/20/2022, 5:47 PM Office: [phone]

## 2022-04-29 ENCOUNTER — Ambulatory Visit (INDEPENDENT_AMBULATORY_CARE_PROVIDER_SITE_OTHER): Payer: 59 | Admitting: Podiatry

## 2022-04-29 DIAGNOSIS — M1460 Charcot's joint, unspecified site: Secondary | ICD-10-CM

## 2022-04-29 DIAGNOSIS — M2042 Other hammer toe(s) (acquired), left foot: Secondary | ICD-10-CM

## 2022-04-29 DIAGNOSIS — M2041 Other hammer toe(s) (acquired), right foot: Secondary | ICD-10-CM

## 2022-05-01 DIAGNOSIS — Z794 Long term (current) use of insulin: Secondary | ICD-10-CM | POA: Diagnosis not present

## 2022-05-01 DIAGNOSIS — E108 Type 1 diabetes mellitus with unspecified complications: Secondary | ICD-10-CM | POA: Diagnosis not present

## 2022-05-03 DIAGNOSIS — E1122 Type 2 diabetes mellitus with diabetic chronic kidney disease: Secondary | ICD-10-CM | POA: Diagnosis not present

## 2022-05-03 DIAGNOSIS — R001 Bradycardia, unspecified: Secondary | ICD-10-CM | POA: Diagnosis not present

## 2022-05-03 DIAGNOSIS — I10 Essential (primary) hypertension: Secondary | ICD-10-CM | POA: Diagnosis not present

## 2022-05-03 DIAGNOSIS — I5032 Chronic diastolic (congestive) heart failure: Secondary | ICD-10-CM | POA: Diagnosis not present

## 2022-05-03 DIAGNOSIS — J45909 Unspecified asthma, uncomplicated: Secondary | ICD-10-CM | POA: Diagnosis not present

## 2022-05-03 DIAGNOSIS — N184 Chronic kidney disease, stage 4 (severe): Secondary | ICD-10-CM | POA: Diagnosis not present

## 2022-05-03 DIAGNOSIS — E039 Hypothyroidism, unspecified: Secondary | ICD-10-CM | POA: Diagnosis not present

## 2022-05-05 ENCOUNTER — Telehealth: Payer: Self-pay | Admitting: Podiatry

## 2022-05-05 NOTE — Telephone Encounter (Signed)
Left message on machine to call back to schedule picking up diabetic shoes .

## 2022-05-06 ENCOUNTER — Ambulatory Visit: Payer: 59

## 2022-05-06 DIAGNOSIS — R0989 Other specified symptoms and signs involving the circulatory and respiratory systems: Secondary | ICD-10-CM | POA: Diagnosis not present

## 2022-05-16 ENCOUNTER — Other Ambulatory Visit: Payer: 59

## 2022-05-16 DIAGNOSIS — E108 Type 1 diabetes mellitus with unspecified complications: Secondary | ICD-10-CM | POA: Diagnosis not present

## 2022-05-16 DIAGNOSIS — Z794 Long term (current) use of insulin: Secondary | ICD-10-CM | POA: Diagnosis not present

## 2022-05-19 ENCOUNTER — Ambulatory Visit: Payer: 59 | Admitting: Cardiology

## 2022-05-19 ENCOUNTER — Encounter: Payer: Self-pay | Admitting: Cardiology

## 2022-05-19 VITALS — BP 139/75 | HR 80 | Resp 16 | Ht 60.0 in | Wt 254.0 lb

## 2022-05-19 DIAGNOSIS — I129 Hypertensive chronic kidney disease with stage 1 through stage 4 chronic kidney disease, or unspecified chronic kidney disease: Secondary | ICD-10-CM | POA: Diagnosis not present

## 2022-05-19 DIAGNOSIS — Z794 Long term (current) use of insulin: Secondary | ICD-10-CM | POA: Diagnosis not present

## 2022-05-19 DIAGNOSIS — E1122 Type 2 diabetes mellitus with diabetic chronic kidney disease: Secondary | ICD-10-CM | POA: Diagnosis not present

## 2022-05-19 DIAGNOSIS — N184 Chronic kidney disease, stage 4 (severe): Secondary | ICD-10-CM

## 2022-05-19 DIAGNOSIS — I5032 Chronic diastolic (congestive) heart failure: Secondary | ICD-10-CM | POA: Diagnosis not present

## 2022-05-19 MED ORDER — SEMAGLUTIDE (1 MG/DOSE) 4 MG/3ML ~~LOC~~ SOPN: 1.0000 mg | PEN_INJECTOR | SUBCUTANEOUS | 2 refills | Status: DC

## 2022-05-19 NOTE — Progress Notes (Signed)
Primary Physician/Referring:  Iona Beard, MD  Patient ID: [name], female    DOB: [birth year], 56 y.o.   MRN: [id number]  Chief Complaint  Patient presents with   Hypertension   Follow-up    1 year    [name]  is a 57 y.o. 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  Office visit 6 weeks ago, I started her on Ozempic which she is tolerating.  It is helping her to eat less.  She has lost about 5 pounds in weight.  She has not had any significant side effects.  She has not had any recurrence of chest pain, dyspnea has remained stable.  She has mild bilateral leg edema that has remained stable.  No PND or orthopnea.   Patient states that she is still continues to have difficulty with weight loss.  Past Medical History:  Diagnosis Date   Acute asthma exacerbation 12/21/2014   Acute renal failure ([place])    ARF (acute renal failure) ([place]) 05/03/2014   Asthma    Asthma exacerbation 05/03/2014   Asthma, severe persistent 05/03/2014   Chronic diastolic CHF (congestive heart failure) ([place]) 06/21/2018   Diabetes mellitus    Diabetes mellitus type 2 in obese ([place]) 05/03/2014   History of echocardiogram [id number]   LVH, diastolic dysfunction   Hyperlipidemia    Hypertension    Hyponatremia 12/02/2014   Obesity    Preop examination 05/18/2017   SOB (shortness of breath) 12/22/2014   Past Surgical History:  Procedure Laterality Date   CARDIAC CATHETERIZATION  03/2013   normal coronary arteries, EF 55%   CARDIAC CATHETERIZATION  03/2013   normal coronary arteries   CATARACT EXTRACTION W/PHACO Right 12/10/2012   Procedure: CATARACT EXTRACTION PHACO AND INTRAOCULAR LENS PLACEMENT ([place]);  Surgeon: Tonny Branch, MD;  Location: [facility];   Service: Ophthalmology;  Laterality: Right;  CDE:22..42   CATARACT EXTRACTION W/PHACO Left 05/11/2015   Procedure: CATARACT EXTRACTION PHACO AND INTRAOCULAR LENS PLACEMENT LEFT EYE CDE=6.54;  Surgeon: Tonny Branch, MD;  Location: [facility];  Service: Ophthalmology;  Laterality: Left;   COLONOSCOPY N/A 09/22/2017   Procedure: COLONOSCOPY;  Surgeon: Danie Binder, MD;  Location: [facility];  Service: Endoscopy;  Laterality: N/A;  12:15   COLONOSCOPY WITH PROPOFOL N/A 09/27/2021   Procedure: COLONOSCOPY WITH PROPOFOL;  Surgeon: Eloise Harman, DO;  Location: [facility];  Service: Endoscopy;  Laterality: N/A;  8:15am   EYE SURGERY Left    GASTRIC BYPASS  05/01/2018   GASTRIC ROUX-EN-Y N/A 05/01/2018   Procedure: LAPAROSCOPIC ROUX-EN-Y GASTRIC BYPASS WITH UPPER ENDOSCOPY WITH ERAS PATHWAY;  Surgeon: Kinsinger, Arta Bruce, MD;  Location: [facility];  Service: General;  Laterality: N/A;   LEFT HEART CATHETERIZATION WITH CORONARY ANGIOGRAM N/A 04/09/2013   Procedure: LEFT HEART CATHETERIZATION WITH CORONARY ANGIOGRAM;  Surgeon: Laverda Page, MD;  Location: [facility] LAB;  Service: Cardiovascular;  Laterality: N/A;   Loop recorder implantation     Insertion of [employer]. Serial # [id number] 09/29/21 for heart failure research   TRACHEOSTOMY     at age 76 from asthma attack   Family History  Problem Relation Age of Onset   Diabetes Mother    Asthma Mother    Bronchitis Mother    Colon polyps Father  Hypertension Sister    Asthma Other    Hypertension Other    Colon cancer Neg Hx    Social History   Tobacco Use   Smoking status: Never   Smokeless tobacco: Never  Substance Use Topics   Alcohol use: Yes    Comment: occ    Allergies  No Known Allergies   Final Medications at End of Visit    Current Outpatient Medications:    Semaglutide, 1 MG/DOSE, 4 MG/3ML SOPN, Inject 1 mg into the skin once a week., Disp: 3 mL, Rfl: 2   acetaminophen (TYLENOL) 500 MG  tablet, Take 500 mg by mouth every 6 (six) hours as needed for moderate pain., Disp: , Rfl:    albuterol (PROVENTIL HFA;VENTOLIN HFA) 108 (90 BASE) MCG/ACT inhaler, Inhale 2 puffs into the lungs every 6 (six) hours as needed for wheezing., Disp: , Rfl:    albuterol (PROVENTIL) (2.5 MG/3ML) 0.083% nebulizer solution, Take 3 mLs (2.5 mg total) by nebulization every 6 (six) hours as needed for wheezing or shortness of breath., Disp: 75 mL, Rfl: 12   amLODipine (NORVASC) 5 MG tablet, TAKE ONE TABLET BY MOUTH ONCE DAILY., Disp: 90 tablet, Rfl: 0   atorvastatin (LIPITOR) 40 MG tablet, Take 40 mg by mouth daily., Disp: , Rfl:    calcitRIOL (ROCALTROL) 0.25 MCG capsule, Take 0.25 mcg by mouth daily., Disp: , Rfl:    Calcium Carbonate-Vitamin D (CALCIUM 600+D PO), Take 1 tablet by mouth daily., Disp: , Rfl:    carboxymethylcellulose (REFRESH PLUS) 0.5 % SOLN, Place 1 drop into both eyes 3 (three) times daily as needed (dry eyes)., Disp: , Rfl:    carvedilol (COREG) 6.25 MG tablet, Take 6.25 mg by mouth 2 (two) times daily. , Disp: , Rfl:    Cholecalciferol (VITAMIN D) 2000 units tablet, Take 2,000 Units by mouth daily. , Disp: , Rfl:    FARXIGA 5 MG TABS tablet, Take 5 mg by mouth daily., Disp: , Rfl:    FIBER ADULT GUMMIES PO, Take 3 capsules by mouth daily., Disp: , Rfl:    Fluticasone-Salmeterol (ADVAIR) 100-50 MCG/DOSE AEPB, Inhale 1 puff into the lungs daily as needed (shortness of breath)., Disp: , Rfl:    gabapentin (NEURONTIN) 300 MG capsule, Take 1 capsule (300 mg total) by mouth 4 (four) times daily. (Patient taking differently: Take 300 mg by mouth 4 (four) times daily as needed (pain).), Disp: 120 capsule, Rfl: 2   HUMALOG 100 UNIT/ML injection, Uses in insulin pump, Disp: , Rfl:    hydrALAZINE (APRESOLINE) 25 MG tablet, Take 1 tablet (25 mg total) by mouth 3 (three) times daily., Disp: 270 tablet, Rfl: 1   Insulin Human (INSULIN PUMP) SOLN, Inject into the skin., Disp: , Rfl:    levothyroxine  (SYNTHROID, LEVOTHROID) 75 MCG tablet, Take 75 mcg by mouth daily before breakfast., Disp: , Rfl:    Lifitegrast (XIIDRA) 5 % SOLN, Place 1 drop into both eyes daily as needed (itchy eyes)., Disp: , Rfl:    montelukast (SINGULAIR) 10 MG tablet, Take 10 mg by mouth daily. , Disp: , Rfl:    Multiple Vitamins-Minerals (ADULT GUMMY PO), Take 1 capsule by mouth daily., Disp: , Rfl:    torsemide (DEMADEX) 20 MG tablet, Take 40 mg by mouth 2 (two) times daily., Disp: , Rfl:    ROS   Review of Systems  Cardiovascular:  Negative for chest pain, dyspnea on exertion and leg swelling.   Objective:  Blood pressure 139/75, pulse 80, resp.  rate 16, height 5' (1.524 m), weight 254 lb (115.2 kg), last menstrual period 07/29/2013, SpO2 93 %. Body mass index is 49.61 kg/m.     05/19/2022   10:23 AM 04/20/2022    2:45 PM 04/20/2022    2:33 PM  Vitals with BMI  Height 5\' 0"   5\' 0"   Weight 254 lbs  259 lbs 6 oz  BMI 99991111  [id number]  Systolic [id number]  Diastolic 75 68 53  Pulse 80 51 52    Physical Exam Vitals reviewed.  Constitutional:      Appearance: She is well-developed. She is morbidly obese.     Comments: Morbidly obese  Neck:     Vascular: Carotid bruit (bilateral) present. No JVD.     Comments: Short neck and difficult to evaluate JVP Cardiovascular:     Rate and Rhythm: Normal rate and regular rhythm.     Pulses: Normal pulses and intact distal pulses.          Dorsalis pedis pulses are 2+ on the right side and 2+ on the left side.       Posterior tibial pulses are 2+ on the right side and 2+ on the left side.     Heart sounds: Murmur heard.     Midsystolic murmur is present with a grade of 2/6 at the upper right sternal border.     No gallop.  Pulmonary:     Effort: Pulmonary effort is normal.     Breath sounds: Normal breath sounds.  Abdominal:     General: Bowel sounds are normal.     Palpations: Abdomen is soft.     Comments: Obese. Pannus present  Musculoskeletal:     Right  lower leg: No edema.     Left lower leg: No edema.    Lab Results  Component Value Date   NA 141 09/23/2021   K 4.0 09/23/2021   CO2 28 09/23/2021   GLUCOSE 93 09/23/2021   BUN 48 (H) 09/23/2021   CREATININE 2.35 (H) 09/23/2021   CALCIUM 8.9 09/23/2021   EGFR 36 (L) 03/02/2021   GFRNONAA 24 (L) 09/23/2021       Latest Ref Rng & Units 09/23/2021    8:45 AM 03/02/2021    2:40 PM 01/15/2021   11:41 AM  CMP  Glucose 70 - 99 mg/dL 93  87  67   BUN 6 - 20 mg/dL 48  51  46   Creatinine 0.44 - 1.00 mg/dL 2.35  1.68  2.00   Sodium 135 - 145 mmol/L 141  144  142   Potassium 3.5 - 5.1 mmol/L 4.0  4.1  4.6   Chloride 98 - 111 mmol/L 108  106  104   CO2 22 - 32 mmol/L 28  24  23    Calcium 8.9 - 10.3 mg/dL 8.9  9.2  9.0       Latest Ref Rng & Units 10/30/2020    6:27 PM 05/02/2018    5:05 AM 05/01/2018   10:47 AM  CBC  WBC 4.0 - 10.5 K/uL 7.7  15.5    Hemoglobin 12.0 - 15.0 g/dL 11.0  10.4  10.7   Hematocrit 36.0 - 46.0 % 36.0  35.3  35.6   Platelets 150 - 400 K/uL 173  216      External labs:  Cholesterol, total 156.000 m 08/03/2021 HDL 58.000 mg 08/03/2021 LDL 87.000 mg 08/03/2021 Triglycerides 38.000 mg 08/03/2021  A1C 6.400 02/09/2022 TSH 2.780 02/08/2022  Hemoglobin  11.000 g/d 10/30/2020  Creatinine, Serum 2.160 mg/ 02/08/2022 Potassium 4.300 mm 02/08/2022 ALT (SGPT) 14.000 U/L 02/08/2022  BNP 105.700 p 08/20/2021   12/29/2020: Sodium 144, potassium 4.7, BUN 49, creatinine 1.91, GFR 31 Hgb 10.9, HCT 34.3, MCV 84, platelet 179  Radiology :  No results found.  Cardiac studies:   Sleep Study 2016: Negative sleep study for apnea.    Coronary angiogram 04/09/2013: Normal coronary arteries, normal left ventricle systolic function. Markedly elevated LVEDP of 31 mmHg.  PCV ECHOCARDIOGRAM COMPLETE 01/19/2021  Narrative Echocardiogram 01/19/2021: Normal LV systolic function with visual EF 55-60%. Left ventricle cavity is normal in size. Normal left ventricular wall thickness.  Normal global wall motion. Indeterminate diastolic filling pattern, elevated LAP. Mild tricuspid regurgitation. No evidence of pulmonary hypertension. Compared to study 02/17/2011 no significant change.      Renal artery duplex  08/18/2021: Right: Normal size right kidney. No visualized stenosis in segments        visualized, limited visualization. Left:  Normal size of left kidney. No visualized stenosis in        segments visualized, limited visualization.  EKG:   EKG 05/19/2022: Normal sinus rhythm at the rate of 58 bpm, normal EKG.  Compared to 04/20/2022, no change.  Assessment:      ICD-10-CM   1. Chronic diastolic CHF (congestive heart failure) (HCC)  I50.32 EKG 12-Lead    Semaglutide, 1 MG/DOSE, 4 MG/3ML SOPN    Pro b natriuretic peptide (BNP)    2. Type 2 diabetes mellitus with stage 4 chronic kidney disease, with long-term current use of insulin (HCC)  E11.22 Semaglutide, 1 MG/DOSE, 4 MG/3ML SOPN   N18.4 Lipid Panel With LDL/HDL Ratio   [id number] Basic metabolic panel    VITAMIN D 25 Hydroxy (Vit-D Deficiency, Fractures)    3. Class 3 severe obesity due to excess calories with serious comorbidity and body mass index (BMI) of 50.0 to 59.9 in adult [facility])  E66.01    Z68.43         Meds ordered this encounter  Medications   Semaglutide, 1 MG/DOSE, 4 MG/3ML SOPN    Sig: Inject 1 mg into the skin once a week.    Dispense:  3 mL    Refill:  2    Medications Discontinued During This Encounter  Medication Reason   Semaglutide(0.25 or 0.5MG /DOS) SOPN 0.25 mg      Recommendations:    [name]  is a 58 y.o. 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  1. Chronic diastolic CHF (congestive heart failure) ([place]) Patient is very  well compensated with regard to diastolic heart failure.  Presently on Farxiga and also on torsemide.  She is also on metoprolol.  Continue the same.  2. Type 2 diabetes mellitus with stage 4 chronic kidney disease, with long-term current use of insulin ([place]) She has stage IV chronic kidney disease, again I have discussed with her regarding weight loss the importance of weight loss and continued control of her diabetes mellitus.  She is now tolerating Ozempic which we will escalate the dose to 1 mg.  3. Class 3 severe obesity due to excess calories with serious comorbidity and body mass index (BMI) of 50.0 to 59.9 in adult [facility]) She has lost about 5 pounds in weight since last office visit 6  weeks ago, will continue to monitor this closely.  Would like to see her back in 6 to 8 weeks for follow-up as I may have to change her blood pressure medications as she continues to lose weight and also look at her medication list to see if I need to make any other changes apart from antihypertensive medications in view of patient being on Ozempic especially if she continues to lose weight.  I will obtain labs prior to her next office visit.    Adrian Prows, PA-C 05/19/2022, 11:04 AM Office: [phone]

## 2022-05-30 DIAGNOSIS — E108 Type 1 diabetes mellitus with unspecified complications: Secondary | ICD-10-CM | POA: Diagnosis not present

## 2022-05-30 DIAGNOSIS — Z794 Long term (current) use of insulin: Secondary | ICD-10-CM | POA: Diagnosis not present

## 2022-06-01 ENCOUNTER — Telehealth: Payer: Self-pay | Admitting: Podiatry

## 2022-06-01 NOTE — Telephone Encounter (Signed)
Shoes are not available  [id number] 7.5 4e,  please pick another shoe and place another order.

## 2022-06-03 ENCOUNTER — Telehealth: Payer: Self-pay | Admitting: Cardiology

## 2022-06-03 NOTE — Telephone Encounter (Signed)
Answering service.  Patient currently is enrolled into clinical trial and has [employer] loop recorder implant.  She is calling to inform us that her cell phone gives an error message " device not connected."  I have asked her to call us back on Monday and Lauren can look into this further.  Brunilda Eble Malabar, DO, [facility]

## 2022-06-09 ENCOUNTER — Encounter: Payer: 59 | Attending: General Surgery | Admitting: Dietician

## 2022-06-09 DIAGNOSIS — Z6841 Body Mass Index (BMI) 40.0 and over, adult: Secondary | ICD-10-CM | POA: Diagnosis present

## 2022-06-09 DIAGNOSIS — E11649 Type 2 diabetes mellitus with hypoglycemia without coma: Secondary | ICD-10-CM | POA: Diagnosis not present

## 2022-06-09 NOTE — Progress Notes (Signed)
Medical Nutrition Therapy   Primary Concerns Today: Bariatric Surgery Nutrition Follow Up   Bariatric Surgery Type: RYGB     Surgery Date: 05/01/2018   NUTRITION ASSESSMENT  Sensor reading 93 now CGM Results from download: 06/09/2022  % Time CGM active:    %   (Goal >70%)  Average glucose:   102 mg/dL for 14 days  Glucose management indicator:    %  Time in range (70-180 mg/dL):   93 %   (Goal >99%)  Time High (181-250 mg/dL):   2 %   (Goal < 35%)  Time Very High (>250 mg/dL):    0 %   (Goal < 5%)  Time Low (54-69 mg/dL):   4 %   (Goal <7%)  Time Very Low (<54 mg/dL):   1 %   (Goal <0%)   She is on a Medtronic insulin pump - does not carbohydrate count.  Uses a set insulin dose for each meal.  CGM:  Dexcom G7  She would like to learn what she can eat She is on Ozempic and does not have an appetite. Cooks then doesn't feel like eating  Anthropometrics  364 lbs highest adult weight Start weight at [facility]: 351.1 lbs  Today's weight: 248 lbs pounds  Body Composition Scale 09/22/2020 10/26/2021 08/04/2022  Weight 265.5 258.6 250.3  BMI 51 49.2 48.8  Total Body Fat % 49.6 22 22      Visceral Fat 23 50.7 51.1  Fat-Free Mass % 50.3 39.8 40     Total Body Water % 39.6 27.8 27.4     Muscle-Mass lbs 28 49.6 48.7  Body Fat Displacement            Torso  lbs 81.7 78.9 75.8         Left Leg  lbs 16.3 15.7 15.1         Right Leg  lbs 16.3 15.7 15.1         Left Arm  lbs 8.1 7.8 7.5         Right Arm   lbs 8.1 7.8 7.5      Medical dx:  CKD stage 3, DM type 2, CHF, HTN, HLD Labs: BUN 45, creatinine 2.08, potassium 4, phosphorous 4.2 (decreased from 4.9 - high), eGFR 28 on 07/01/2021 Vitamin B-12 371, thiamine 75.5 on 07/23/2021 Medications: Humalog, ozempic, farxiga  24-Hr Dietary Recall First Meal: 2 boiled eggs and coffee OR grits and sausage OR dry frosted mini wheats Snack:  grapes, PB on cracker Second Meal:  1/2 BBQ, 1/2 onion rings OR chicken tenders (baked), cabbage Snack:   Third Meal: none Snack: unsalted saltines with PB  Beverages:  water, coffee with cream and splenda, diet cranberry juice, sugar free lemonade, zero soda (coke/pepsi)   Estimated Daily Fluid Intake: 66+ ounces  Estimated Daily Protein Intake:  40 g Supplements: bariatric MVI, calcium, vitamin D and gummy fiber supplement Current average weekly physical activity: walking 2000 steps    Signs/Symptoms  Using straws: no Drinking while eating: no Chewing/swallowing difficulties: no Changes in vision: no Changes to mood/headaches: no Hair loss/changes to skin/nails: no Difficulty focusing/concentrating: no Sweating: no Dizziness/lightheadedness: no Palpitations: no Carbonated/caffeinated beverages: yes N/V/D/C/Gas: yes after box of Reece's pieces Abdominal pain: no Dumping syndrome: no   NUTRITION DIAGNOSIS  Overweight/obesity ([place]-3.3) related to past poor dietary habits and physical inactivity as evidenced by patient w/ completed RYGB surgery following dietary guidelines for continued weight loss and healthy nutrition status within the context of CKD  and DM   NUTRITION INTERVENTION: continued Nutrition counseling (C-1) and education (E-2) to facilitate bariatric surgery goals, including: The importance of consuming adequate calories as well as certain nutrients daily due to the body's need for essential vitamins, minerals, and fats The importance of daily physical activity and to reach a goal of at least 150 minutes of moderate to vigorous physical activity weekly (or as directed by their physician) due to benefits such as increased musculature and improved lab values Encouraged patient to honor their body's internal hunger and fullness cues.  Throughout the day, check in mentally and rate hunger. Stop eating when satisfied not full regardless of how much food is left on the plate.  Get more if still hungry 20-30 minutes later.  The key is to honor satisfaction so throughout the meal,  rate fullness factor and stop when comfortably satisfied not physically full. The key is to honor hunger and fullness without any feelings of guilt or shame.  Pay attention to what the internal cues are, rather than any external factors. This will enhance the confidence you have in listening to your own body and following those internal cues enabling you to increase how often you eat when you are hungry not out of appetite and stop when you are satisfied not full.  Encouraged pt to continue to eat balanced meals inclusive of non starchy vegetables 2 times a day 7 days a week Encouraged pt to choose lean protein sources: limiting beef, pork, sausage, hotdogs, and lunch meat Encourage pt to choose healthy fats such as plant based limiting animal fats Encouraged pt to continue to drink a minium 64 fluid ounces with half being plain water to satisfy proper hydration  Educated on hypoglycemia correction  Hyperphosphoremia and its consequences  Today discussed contacting her provider if she continues to have lows to get her insulin pump settings changed. Discussed use of Dexcom G7 cell phone app and ability to change with next sensor change reviewed renal guidelines - low sodium, low phos  Handouts Previously Provided Include  Meal ideas with appropriate portion size Mindful meals check list Eat this not that phosphorus list of foods   Readiness for Change: contemplating  Demonstrated degree of understanding via: Teach Back     MONITORING & EVALUATION Dietary intake, weekly physical activity, and body weight follow up   Follow up in 2 months with Jon Gills (bariatric) and Vernona Rieger in 3 months for diabetes/pump/cgm

## 2022-06-09 NOTE — Patient Instructions (Addendum)
Avoid dark soda (no zero coke or zero pepsi) Avoid added salt and limit added sugar Limit fried Mindful choices  If you are still having more low blood glucose, talk to your doctor to have your pump settings changed. Next time you change your Dexcom sensor, start it on your cell phone.

## 2022-06-10 NOTE — Telephone Encounter (Signed)
[name] has been informed and will follow up.

## 2022-06-15 DIAGNOSIS — Z794 Long term (current) use of insulin: Secondary | ICD-10-CM | POA: Diagnosis not present

## 2022-06-15 DIAGNOSIS — E108 Type 1 diabetes mellitus with unspecified complications: Secondary | ICD-10-CM | POA: Diagnosis not present

## 2022-06-20 NOTE — Telephone Encounter (Signed)
Patient called wants to know what the hold up with her shoes are?

## 2022-06-24 DIAGNOSIS — E108 Type 1 diabetes mellitus with unspecified complications: Secondary | ICD-10-CM | POA: Diagnosis not present

## 2022-06-24 DIAGNOSIS — Z794 Long term (current) use of insulin: Secondary | ICD-10-CM | POA: Diagnosis not present

## 2022-06-28 DIAGNOSIS — E1122 Type 2 diabetes mellitus with diabetic chronic kidney disease: Secondary | ICD-10-CM | POA: Diagnosis not present

## 2022-06-28 DIAGNOSIS — I5032 Chronic diastolic (congestive) heart failure: Secondary | ICD-10-CM | POA: Diagnosis not present

## 2022-06-28 DIAGNOSIS — L110 Acquired keratosis follicularis: Secondary | ICD-10-CM | POA: Diagnosis not present

## 2022-06-28 DIAGNOSIS — N184 Chronic kidney disease, stage 4 (severe): Secondary | ICD-10-CM | POA: Diagnosis not present

## 2022-06-28 DIAGNOSIS — E1140 Type 2 diabetes mellitus with diabetic neuropathy, unspecified: Secondary | ICD-10-CM | POA: Diagnosis not present

## 2022-06-28 DIAGNOSIS — Z794 Long term (current) use of insulin: Secondary | ICD-10-CM | POA: Diagnosis not present

## 2022-06-28 DIAGNOSIS — L609 Nail disorder, unspecified: Secondary | ICD-10-CM | POA: Diagnosis not present

## 2022-06-29 LAB — LIPID PANEL WITH LDL/HDL RATIO
Cholesterol, Total: 169 mg/dL (ref 100–199)
HDL: 67 mg/dL (ref >39)
LDL Chol Calc (NIH): 91 mg/dL (ref 0–99)
LDL/HDL Ratio: 1.4 ratio (ref 0.0–3.2)
Triglycerides: 57 mg/dL (ref 0–149)
VLDL Cholesterol Cal: 11 mg/dL (ref 5–40)

## 2022-06-29 LAB — BASIC METABOLIC PANEL
BUN/Creatinine Ratio: 16 (ref 9–23)
BUN: 31 mg/dL — ABNORMAL HIGH (ref 6–24)
CO2: 23 mmol/L (ref 20–29)
Calcium: 9.4 mg/dL (ref 8.7–10.2)
Chloride: 103 mmol/L (ref 96–106)
Creatinine, Ser: 1.89 mg/dL — ABNORMAL HIGH (ref 0.57–1.00)
Glucose: 69 mg/dL — ABNORMAL LOW (ref 70–99)
Potassium: 4.2 mmol/L (ref 3.5–5.2)
Sodium: 144 mmol/L (ref 134–144)
eGFR: 31 mL/min/{1.73_m2} — ABNORMAL LOW (ref >59)

## 2022-06-29 LAB — PRO B NATRIURETIC PEPTIDE: NT-Pro BNP: 561 pg/mL — ABNORMAL HIGH (ref 0–287)

## 2022-06-29 LAB — VITAMIN D 25 HYDROXY (VIT D DEFICIENCY, FRACTURES): Vit D, 25-Hydroxy: 35.6 ng/mL (ref 30.0–100.0)

## 2022-06-30 ENCOUNTER — Ambulatory Visit: Payer: 59 | Admitting: Cardiology

## 2022-06-30 ENCOUNTER — Encounter: Payer: Self-pay | Admitting: Cardiology

## 2022-06-30 VITALS — BP 124/77 | HR 56 | Resp 14 | Ht 60.0 in | Wt 251.4 lb

## 2022-06-30 DIAGNOSIS — N184 Chronic kidney disease, stage 4 (severe): Secondary | ICD-10-CM | POA: Diagnosis not present

## 2022-06-30 DIAGNOSIS — I5032 Chronic diastolic (congestive) heart failure: Secondary | ICD-10-CM | POA: Diagnosis not present

## 2022-06-30 DIAGNOSIS — E1122 Type 2 diabetes mellitus with diabetic chronic kidney disease: Secondary | ICD-10-CM

## 2022-06-30 DIAGNOSIS — Z794 Long term (current) use of insulin: Secondary | ICD-10-CM | POA: Diagnosis not present

## 2022-06-30 DIAGNOSIS — I10 Essential (primary) hypertension: Secondary | ICD-10-CM | POA: Diagnosis not present

## 2022-06-30 DIAGNOSIS — Z006 Encounter for examination for normal comparison and control in clinical research program: Secondary | ICD-10-CM

## 2022-06-30 MED ORDER — OZEMPIC (2 MG/DOSE) 8 MG/3ML ~~LOC~~ SOPN: 2.0000 mg | PEN_INJECTOR | SUBCUTANEOUS | 2 refills | Status: DC

## 2022-06-30 NOTE — Progress Notes (Signed)
Primary Physician/Referring:  Mirna Mires, MD  Patient ID: [name], female    DOB: Sep 03, 1965, 57 y.o.   MRN: [id number]  Chief Complaint  Patient presents with   Hypertension   Chronic diastolic CHF    Diabetes Mellitus   Follow-up    Labs    [name]  is a 57 y.o. 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  On 05/19/2022,, I started her on Ozempic which she is tolerating.  It is helping her to eat less. She has not had any significant side effects and states that she is requiring less insulin as she is developing low blood sugars.  She has not had any recurrence of chest pain, dyspnea has remained stable.  She has not had any further leg edema.  No PND or orthopnea.    Past Medical History:  Diagnosis Date   Acute asthma exacerbation 12/21/2014   Acute renal failure (HCC)    ARF (acute renal failure) (HCC) 05/03/2014   Asthma    Asthma exacerbation 05/03/2014   Asthma, severe persistent 05/03/2014   Chronic diastolic CHF (congestive heart failure) (HCC) 06/21/2018   Diabetes mellitus    Diabetes mellitus type 2 in obese 05/03/2014   History of echocardiogram 04/2014   LVH, diastolic dysfunction   Hyperlipidemia    Hypertension    Hyponatremia 12/02/2014   Obesity    Preop examination 05/18/2017   SOB (shortness of breath) 12/22/2014   Past Surgical History:  Procedure Laterality Date   CARDIAC CATHETERIZATION  03/2013   normal coronary arteries, EF 55%   CARDIAC CATHETERIZATION  03/2013   normal coronary arteries   CATARACT EXTRACTION W/PHACO Right 12/10/2012   Procedure: CATARACT EXTRACTION PHACO AND INTRAOCULAR LENS PLACEMENT (IOC);  Surgeon: Gemma Payor, MD;  Location: [facility];  Service: Ophthalmology;   Laterality: Right;  CDE:22..42   CATARACT EXTRACTION W/PHACO Left 05/11/2015   Procedure: CATARACT EXTRACTION PHACO AND INTRAOCULAR LENS PLACEMENT LEFT EYE CDE=6.54;  Surgeon: Gemma Payor, MD;  Location: [facility];  Service: Ophthalmology;  Laterality: Left;   COLONOSCOPY N/A 09/22/2017   Procedure: COLONOSCOPY;  Surgeon: West Bali, MD;  Location: [facility];  Service: Endoscopy;  Laterality: N/A;  12:15   COLONOSCOPY WITH PROPOFOL N/A 09/27/2021   Procedure: COLONOSCOPY WITH PROPOFOL;  Surgeon: Lanelle Bal, DO;  Location: [facility];  Service: Endoscopy;  Laterality: N/A;  8:15am   EYE SURGERY Left    GASTRIC BYPASS  05/01/2018   GASTRIC ROUX-EN-Y N/A 05/01/2018   Procedure: LAPAROSCOPIC ROUX-EN-Y GASTRIC BYPASS WITH UPPER ENDOSCOPY WITH ERAS PATHWAY;  Surgeon: Kinsinger, De Blanch, MD;  Location: [facility];  Service: General;  Laterality: N/A;   LEFT HEART CATHETERIZATION WITH CORONARY ANGIOGRAM N/A 04/09/2013   Procedure: LEFT HEART CATHETERIZATION WITH CORONARY ANGIOGRAM;  Surgeon: Pamella Pert, MD;  Location: [facility] CATH LAB;  Service: Cardiovascular;  Laterality: N/A;   Loop recorder implantation     Insertion of [employer] Water quality scientist. Serial # [id number] 09/29/21 for heart failure research   TRACHEOSTOMY     at age 66 from asthma attack   Family History  Problem Relation Age of Onset   Diabetes Mother    Asthma Mother    Bronchitis Mother    Colon polyps Father    Hypertension Sister  Asthma Other    Hypertension Other    Colon cancer Neg Hx    Social History   Tobacco Use   Smoking status: Never   Smokeless tobacco: Never  Substance Use Topics   Alcohol use: Yes    Comment: occ    ROS   Review of Systems  Cardiovascular:  Negative for chest pain, dyspnea on exertion and leg swelling.   Objective:  Blood pressure 124/77, pulse (!) 56, resp. rate 14, height 5' (1.524 m), weight 251 lb 6.4 oz (114 kg), last menstrual period 07/29/2013, SpO2 95  %. Body mass index is 49.1 kg/m.     06/30/2022   10:11 AM 05/19/2022   10:23 AM 04/20/2022    2:45 PM  Vitals with BMI  Height 5\' 0"  5\' 0"    Weight 251 lbs 6 oz 254 lbs   BMI 49.1 49.61   Systolic 124 139 161  Diastolic 77 75 68  Pulse 56 80 51    Physical Exam Vitals reviewed.  Constitutional:      Appearance: She is well-developed. She is morbidly obese.     Comments: Morbidly obese  Neck:     Vascular: Carotid bruit (bilateral) present. No JVD.     Comments: Short neck and difficult to evaluate JVP Cardiovascular:     Rate and Rhythm: Normal rate and regular rhythm.     Pulses: Normal pulses and intact distal pulses.          Dorsalis pedis pulses are 2+ on the right side and 2+ on the left side.       Posterior tibial pulses are 2+ on the right side and 2+ on the left side.     Heart sounds: Murmur heard.     Midsystolic murmur is present with a grade of 2/6 at the upper right sternal border.     No gallop.  Pulmonary:     Effort: Pulmonary effort is normal.     Breath sounds: Normal breath sounds.  Abdominal:     General: Bowel sounds are normal.     Palpations: Abdomen is soft.     Comments: Obese. Pannus present  Musculoskeletal:     Right lower leg: No edema.     Left lower leg: No edema.    Lab Results  Component Value Date   NA 144 06/28/2022   K 4.2 06/28/2022   CO2 23 06/28/2022   GLUCOSE 69 (L) 06/28/2022   BUN 31 (H) 06/28/2022   CREATININE 1.89 (H) 06/28/2022   CALCIUM 9.4 06/28/2022   EGFR 31 (L) 06/28/2022   GFRNONAA 24 (L) 09/23/2021       Latest Ref Rng & Units 06/28/2022   11:38 AM 09/23/2021    8:45 AM 03/02/2021    2:40 PM  CMP  Glucose 70 - 99 mg/dL 69  93  87   BUN 6 - 24 mg/dL 31  48  51   Creatinine 0.57 - 1.00 mg/dL 0.96  0.45  4.09   Sodium 134 - 144 mmol/L 144  141  144   Potassium 3.5 - 5.2 mmol/L 4.2  4.0  4.1   Chloride 96 - 106 mmol/L 103  108  106   CO2 20 - 29 mmol/L 23  28  24    Calcium 8.7 - 10.2 mg/dL 9.4  8.9  9.2        Latest Ref Rng & Units 10/30/2020    6:27 PM 05/02/2018    5:05 AM 05/01/2018  10:47 AM  CBC  WBC 4.0 - 10.5 K/uL 7.7  15.5    Hemoglobin 12.0 - 15.0 g/dL 40.9  81.1  91.4   Hematocrit 36.0 - 46.0 % 36.0  35.3  35.6   Platelets 150 - 400 K/uL 173  216     BNP (last 3 results) Recent Labs    08/20/21 1229  BNP 105.7*    ProBNP (last 3 results) Recent Labs    10/01/21 1415 06/28/22 1138  PROBNP 886* 561*   Last vitamin D Lab Results  Component Value Date   VD25OH 35.6 06/28/2022   Lab Results  Component Value Date   CHOL 169 06/28/2022   HDL 67 06/28/2022   LDLCALC 91 06/28/2022   TRIG 57 06/28/2022     External labs:  Cholesterol, total 156.000 m 08/03/2021 HDL 58.000 mg 08/03/2021 LDL 87.000 mg 08/03/2021 Triglycerides 38.000 mg 08/03/2021  A1C 6.400 02/09/2022 TSH 2.780 02/08/2022  Hemoglobin 11.000 g/d 10/30/2020  Creatinine, Serum 2.160 mg/ 02/08/2022 Potassium 4.300 mm 02/08/2022 ALT (SGPT) 14.000 U/L 02/08/2022  BNP 105.700 p 08/20/2021   12/29/2020: Sodium 144, potassium 4.7, BUN 49, creatinine 1.91, GFR 31 Hgb 10.9, HCT 34.3, MCV 84, platelet 179  Radiology :  No results found.  Cardiac studies:   Sleep Study 2016: Negative sleep study for apnea.    Coronary angiogram 04/09/2013: Normal coronary arteries, normal left ventricle systolic function. Markedly elevated LVEDP of 31 mmHg.  PCV ECHOCARDIOGRAM COMPLETE 01/19/2021  Narrative Echocardiogram 01/19/2021: Normal LV systolic function with visual EF 55-60%. Left ventricle cavity is normal in size. Normal left ventricular wall thickness. Normal global wall motion. Indeterminate diastolic filling pattern, elevated LAP. Mild tricuspid regurgitation. No evidence of pulmonary hypertension. Compared to study 02/17/2011 no significant change.      Renal artery duplex  08/18/2021: Right: Normal size right kidney. No visualized stenosis in segments        visualized, limited visualization. Left:   Normal size of left kidney. No visualized stenosis in        segments visualized, limited visualization.  EKG:   EKG 05/19/2022: Normal sinus rhythm at the rate of 58 bpm, normal EKG.  Compared to 04/20/2022, no change.   Allergies & Medications  No Known Allergies  Current Outpatient Medications:    acetaminophen (TYLENOL) 500 MG tablet, Take 500 mg by mouth every 6 (six) hours as needed for moderate pain., Disp: , Rfl:    albuterol (PROVENTIL HFA;VENTOLIN HFA) 108 (90 BASE) MCG/ACT inhaler, Inhale 2 puffs into the lungs every 6 (six) hours as needed for wheezing., Disp: , Rfl:    albuterol (PROVENTIL) (2.5 MG/3ML) 0.083% nebulizer solution, Take 3 mLs (2.5 mg total) by nebulization every 6 (six) hours as needed for wheezing or shortness of breath., Disp: 75 mL, Rfl: 12   amLODipine (NORVASC) 5 MG tablet, TAKE ONE TABLET BY MOUTH ONCE DAILY., Disp: 90 tablet, Rfl: 0   atorvastatin (LIPITOR) 40 MG tablet, Take 40 mg by mouth daily., Disp: , Rfl:    calcitRIOL (ROCALTROL) 0.25 MCG capsule, Take 0.25 mcg by mouth daily., Disp: , Rfl:    Calcium Carbonate-Vitamin D (CALCIUM 600+D PO), Take 1 tablet by mouth daily., Disp: , Rfl:    carboxymethylcellulose (REFRESH PLUS) 0.5 % SOLN, Place 1 drop into both eyes 3 (three) times daily as needed (dry eyes)., Disp: , Rfl:    carvedilol (COREG) 6.25 MG tablet, Take 6.25 mg by mouth 2 (two) times daily. , Disp: , Rfl:    Cholecalciferol (  VITAMIN D) 2000 units tablet, Take 2,000 Units by mouth daily. , Disp: , Rfl:    FARXIGA 5 MG TABS tablet, Take 5 mg by mouth daily., Disp: , Rfl:    FIBER ADULT GUMMIES PO, Take 3 capsules by mouth daily., Disp: , Rfl:    Fluticasone-Salmeterol (ADVAIR) 100-50 MCG/DOSE AEPB, Inhale 1 puff into the lungs daily as needed (shortness of breath)., Disp: , Rfl:    gabapentin (NEURONTIN) 300 MG capsule, Take 1 capsule (300 mg total) by mouth 4 (four) times daily. (Patient taking differently: Take 300 mg by mouth 4 (four) times  daily as needed (pain).), Disp: 120 capsule, Rfl: 2   HUMALOG 100 UNIT/ML injection, Uses in insulin pump, Disp: , Rfl:    hydrALAZINE (APRESOLINE) 25 MG tablet, Take 1 tablet (25 mg total) by mouth 3 (three) times daily., Disp: 270 tablet, Rfl: 1   Insulin Human (INSULIN PUMP) SOLN, Inject into the skin., Disp: , Rfl:    levothyroxine (SYNTHROID, LEVOTHROID) 75 MCG tablet, Take 75 mcg by mouth daily before breakfast., Disp: , Rfl:    Lifitegrast (XIIDRA) 5 % SOLN, Place 1 drop into both eyes daily as needed (itchy eyes)., Disp: , Rfl:    montelukast (SINGULAIR) 10 MG tablet, Take 10 mg by mouth daily. , Disp: , Rfl:    Multiple Vitamins-Minerals (ADULT GUMMY PO), Take 1 capsule by mouth daily., Disp: , Rfl:    Semaglutide, 2 MG/DOSE, (OZEMPIC, 2 MG/DOSE,) 8 MG/3ML SOPN, Inject 2 mg into the skin once a week., Disp: 3 mL, Rfl: 2   torsemide (DEMADEX) 20 MG tablet, Take 40 mg by mouth daily. Change to daily from BID. 06/30/22, Disp: , Rfl:    Assessment:      ICD-10-CM   1. Chronic diastolic CHF (congestive heart failure) (HCC)  I50.32     2. Essential hypertension  I10     3. Type 2 diabetes mellitus with stage 4 chronic kidney disease, with long-term current use of insulin (HCC)  E11.22 Semaglutide, 2 MG/DOSE, (OZEMPIC, 2 MG/DOSE,) 8 MG/3ML SOPN   N18.4    Z79.4     4. Class 3 severe obesity due to excess calories with serious comorbidity and body mass index (BMI) of 50.0 to 59.9 in adult (HCC)  E66.01    Z68.43     5. Research 09/29/2021: Patient enrolled into LUX-Sx TRENDS study (loop implantation for heart failure sensor).  Z00.6         Meds ordered this encounter  Medications   Semaglutide, 2 MG/DOSE, (OZEMPIC, 2 MG/DOSE,) 8 MG/3ML SOPN    Sig: Inject 2 mg into the skin once a week.    Dispense:  3 mL    Refill:  2    Medications Discontinued During This Encounter  Medication Reason   Semaglutide, 1 MG/DOSE, 4 MG/3ML SOPN     Recommendations:    [name]   is a 57 y.o. 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  Hide started on Ozempic on 05/19/2022.  She now presents for follow-up of heart failure and obesity management.  1. Chronic diastolic CHF (congestive heart failure) (HCC) Patient's NT proBNP is decreasing since initiation of Ozempic, renal function has remained stable.  No clinical evidence of heart failure.  Blood pressure is also well-controlled hence continue present medical therapy.  As she is losing weight, she is tolerating Ozempic, will reduce the dose of torsemide once a day.  2. Essential hypertension Renal function has remained stable.  She is tolerating Ozempic.  3. Type 2 diabetes mellitus with stage 4 chronic kidney disease, with long-term current use of insulin (HCC) Tolerating 1 mg and has noticed significant decrease in appetite and also significant improvement in diabetes as well.  She will be calling her insulin pump home health to adjust the dose to reduce the basal insulin rate as she is now getting low blood sugar levels.  - Semaglutide, 2 MG/DOSE, (OZEMPIC, 2 MG/DOSE,) 8 MG/3ML SOPN; Inject 2 mg into the skin once a week.  Dispense: 3 mL; Refill: 2  4. Class 3 severe obesity due to excess calories with serious comorbidity and body mass index (BMI) of 50.0 to 59.9 in adult [facility]) Patient is losing weight, I encouraged her to continue to be careful with her diet.  5. Research 09/29/2021: Patient enrolled into LUX-Sx TRENDS study (loop implantation for heart failure sensor). Patient is enrolled in heart failure trial, we will continue to monitor as per protocol.  Office visit in 3 months.   Yates Decamp, PA-C 06/30/2022, 10:56 AM Office: [phone]

## 2022-07-07 DIAGNOSIS — I129 Hypertensive chronic kidney disease with stage 1 through stage 4 chronic kidney disease, or unspecified chronic kidney disease: Secondary | ICD-10-CM | POA: Diagnosis not present

## 2022-07-07 DIAGNOSIS — N189 Chronic kidney disease, unspecified: Secondary | ICD-10-CM | POA: Diagnosis not present

## 2022-07-07 DIAGNOSIS — E1122 Type 2 diabetes mellitus with diabetic chronic kidney disease: Secondary | ICD-10-CM | POA: Diagnosis not present

## 2022-07-07 DIAGNOSIS — E211 Secondary hyperparathyroidism, not elsewhere classified: Secondary | ICD-10-CM | POA: Diagnosis not present

## 2022-07-11 DIAGNOSIS — E108 Type 1 diabetes mellitus with unspecified complications: Secondary | ICD-10-CM | POA: Diagnosis not present

## 2022-07-11 DIAGNOSIS — Z794 Long term (current) use of insulin: Secondary | ICD-10-CM | POA: Diagnosis not present

## 2022-07-13 DIAGNOSIS — D638 Anemia in other chronic diseases classified elsewhere: Secondary | ICD-10-CM | POA: Diagnosis not present

## 2022-07-13 DIAGNOSIS — I5032 Chronic diastolic (congestive) heart failure: Secondary | ICD-10-CM | POA: Diagnosis not present

## 2022-07-13 DIAGNOSIS — E1122 Type 2 diabetes mellitus with diabetic chronic kidney disease: Secondary | ICD-10-CM | POA: Diagnosis not present

## 2022-07-13 DIAGNOSIS — N184 Chronic kidney disease, stage 4 (severe): Secondary | ICD-10-CM | POA: Diagnosis not present

## 2022-07-14 ENCOUNTER — Other Ambulatory Visit: Payer: Self-pay | Admitting: Cardiology

## 2022-07-14 DIAGNOSIS — I5032 Chronic diastolic (congestive) heart failure: Secondary | ICD-10-CM

## 2022-07-14 DIAGNOSIS — E1122 Type 2 diabetes mellitus with diabetic chronic kidney disease: Secondary | ICD-10-CM

## 2022-07-20 DIAGNOSIS — E569 Vitamin deficiency, unspecified: Secondary | ICD-10-CM | POA: Diagnosis not present

## 2022-07-20 DIAGNOSIS — Z794 Long term (current) use of insulin: Secondary | ICD-10-CM | POA: Diagnosis not present

## 2022-07-20 DIAGNOSIS — E1122 Type 2 diabetes mellitus with diabetic chronic kidney disease: Secondary | ICD-10-CM | POA: Diagnosis not present

## 2022-07-20 DIAGNOSIS — K912 Postsurgical malabsorption, not elsewhere classified: Secondary | ICD-10-CM | POA: Diagnosis not present

## 2022-07-20 DIAGNOSIS — N182 Chronic kidney disease, stage 2 (mild): Secondary | ICD-10-CM | POA: Diagnosis not present

## 2022-07-21 ENCOUNTER — Other Ambulatory Visit: Payer: 59

## 2022-08-01 ENCOUNTER — Ambulatory Visit: Payer: 59 | Admitting: Skilled Nursing Facility1

## 2022-08-02 DIAGNOSIS — E039 Hypothyroidism, unspecified: Secondary | ICD-10-CM | POA: Diagnosis not present

## 2022-08-02 DIAGNOSIS — I10 Essential (primary) hypertension: Secondary | ICD-10-CM | POA: Diagnosis not present

## 2022-08-02 DIAGNOSIS — E1122 Type 2 diabetes mellitus with diabetic chronic kidney disease: Secondary | ICD-10-CM | POA: Diagnosis not present

## 2022-08-02 DIAGNOSIS — N184 Chronic kidney disease, stage 4 (severe): Secondary | ICD-10-CM | POA: Diagnosis not present

## 2022-08-02 DIAGNOSIS — I5032 Chronic diastolic (congestive) heart failure: Secondary | ICD-10-CM | POA: Diagnosis not present

## 2022-08-02 DIAGNOSIS — J45909 Unspecified asthma, uncomplicated: Secondary | ICD-10-CM | POA: Diagnosis not present

## 2022-08-02 DIAGNOSIS — M21611 Bunion of right foot: Secondary | ICD-10-CM | POA: Diagnosis not present

## 2022-08-02 DIAGNOSIS — E7800 Pure hypercholesterolemia, unspecified: Secondary | ICD-10-CM | POA: Diagnosis not present

## 2022-08-10 ENCOUNTER — Ambulatory Visit: Payer: 59 | Admitting: Skilled Nursing Facility1

## 2022-08-11 ENCOUNTER — Telehealth: Payer: Self-pay | Admitting: Podiatry

## 2022-08-11 NOTE — Telephone Encounter (Signed)
Per Mrs [name] ok to put pt in tomorrow . I have scheduled pt at 830am.

## 2022-08-11 NOTE — Telephone Encounter (Signed)
Pt called stating she has already tried on the shoes but the paperwork was expired. Pt states she was told she could just come in with paperwork and pick up shoes. I did explain to pt that she has to have an appt and next available is 7.8.2024 as of now. She said she was told she could not wait that long. Please advise when pt can come in. She would like to come in today or tomorrow.

## 2022-08-12 ENCOUNTER — Ambulatory Visit (INDEPENDENT_AMBULATORY_CARE_PROVIDER_SITE_OTHER): Payer: 59 | Admitting: *Deleted

## 2022-08-12 DIAGNOSIS — E1169 Type 2 diabetes mellitus with other specified complication: Secondary | ICD-10-CM | POA: Diagnosis not present

## 2022-08-12 DIAGNOSIS — M2042 Other hammer toe(s) (acquired), left foot: Secondary | ICD-10-CM

## 2022-08-12 DIAGNOSIS — M1460 Charcot's joint, unspecified site: Secondary | ICD-10-CM

## 2022-08-12 DIAGNOSIS — Z794 Long term (current) use of insulin: Secondary | ICD-10-CM | POA: Diagnosis not present

## 2022-08-12 DIAGNOSIS — M2041 Other hammer toe(s) (acquired), right foot: Secondary | ICD-10-CM | POA: Diagnosis not present

## 2022-08-12 DIAGNOSIS — E108 Type 1 diabetes mellitus with unspecified complications: Secondary | ICD-10-CM | POA: Diagnosis not present

## 2022-08-12 NOTE — Progress Notes (Signed)
Patient presents today to pick up diabetic shoes and insoles.  Patient was dispensed 1 pair of diabetic shoes and 3 pairs of foam casted diabetic insoles. Fit was satisfactory. Instructions for break-in and wear was reviewed and a copy was given to the patient.   Re-appointment for regularly scheduled diabetic foot care visits or if they should experience any trouble with the shoes or insoles.  

## 2022-08-17 ENCOUNTER — Ambulatory Visit: Payer: 59 | Admitting: Student

## 2022-08-25 ENCOUNTER — Other Ambulatory Visit: Payer: Self-pay

## 2022-08-25 MED ORDER — HYDRALAZINE HCL 25 MG PO TABS: 25.0000 mg | ORAL_TABLET | Freq: Three times a day (TID) | ORAL | 3 refills | Status: DC

## 2022-09-05 ENCOUNTER — Encounter: Payer: 59 | Attending: Physician Assistant | Admitting: Physician Assistant

## 2022-09-05 DIAGNOSIS — Z794 Long term (current) use of insulin: Secondary | ICD-10-CM | POA: Insufficient documentation

## 2022-09-05 DIAGNOSIS — I130 Hypertensive heart and chronic kidney disease with heart failure and stage 1 through stage 4 chronic kidney disease, or unspecified chronic kidney disease: Secondary | ICD-10-CM | POA: Insufficient documentation

## 2022-09-05 DIAGNOSIS — Z9884 Bariatric surgery status: Secondary | ICD-10-CM | POA: Insufficient documentation

## 2022-09-05 DIAGNOSIS — E11622 Type 2 diabetes mellitus with other skin ulcer: Secondary | ICD-10-CM | POA: Diagnosis not present

## 2022-09-05 DIAGNOSIS — E1122 Type 2 diabetes mellitus with diabetic chronic kidney disease: Secondary | ICD-10-CM | POA: Diagnosis present

## 2022-09-05 DIAGNOSIS — I87332 Chronic venous hypertension (idiopathic) with ulcer and inflammation of left lower extremity: Secondary | ICD-10-CM | POA: Diagnosis not present

## 2022-09-05 DIAGNOSIS — I890 Lymphedema, not elsewhere classified: Secondary | ICD-10-CM | POA: Diagnosis not present

## 2022-09-05 DIAGNOSIS — I5042 Chronic combined systolic (congestive) and diastolic (congestive) heart failure: Secondary | ICD-10-CM | POA: Diagnosis not present

## 2022-09-05 DIAGNOSIS — N183 Chronic kidney disease, stage 3 unspecified: Secondary | ICD-10-CM | POA: Diagnosis not present

## 2022-09-05 DIAGNOSIS — L97822 Non-pressure chronic ulcer of other part of left lower leg with fat layer exposed: Secondary | ICD-10-CM | POA: Insufficient documentation

## 2022-09-05 NOTE — Progress Notes (Signed)
[name], [name] ([id number]) [id number] Page 1 of 5 Visit Report for 09/05/2022 Abuse Risk Screen Details Patient Name: Date of Service: [name], [name] 09/05/2022 8:15 A M Medical Record Number: [id number] Patient Account Number: [id number] Date of Birth/Sex: Treating RN: [birth year] (57 y.o. [name] Primary Care Zhaniya Swallows: Mirna Mires Other Clinician: Referring Raiyah Speakman: Treating Kathleen Likins/Extender: Allen Derry Self, Referral Weeks in Treatment: 0 Abuse Risk Screen Items Answer ABUSE RISK SCREEN: Has anyone close to you tried to hurt or harm you recentlyo No Do you feel uncomfortable with anyone in your familyo No Has anyone forced you do things that you didnt want to doo No Electronic Signature(s) Signed: 09/05/2022 3:32:36 PM By: Angelina Pih Entered By: Angelina Pih on 09/05/2022 08:45:49 -------------------------------------------------------------------------------- Activities of Daily Living Details Patient Name: Date of Service: [name], [name] 09/05/2022 8:15 A M Medical Record Number: [id number] Patient Account Number: [id number] Date of Birth/Sex: Treating RN: [birth year] (57 y.o. [name] Primary Care Charly Holcomb: Mirna Mires Other Clinician: Referring Anikah Hogge: Treating Neko Mcgeehan/Extender: Allen Derry Self, Referral Weeks in Treatment: 0 Activities of Daily Living Items Answer Activities of Daily Living (Please select one for each item) Drive Automobile Not Able T Medications ake Completely Able Use T elephone Completely Able Care for Appearance Completely Able Use T oilet Completely Able Bath / Shower Completely Able Dress Self Completely Able Feed Self Completely Able Walk Completely Able Get In / Out Bed Completely Able Housework Completely [name], [name] ([id number]) [id number] [id number] Page 2 of 5 Prepare Meals Completely Able Handle Money  Completely Able Shop for Self Completely Able Electronic Signature(s) Signed: 09/05/2022 3:32:36 PM By: Angelina Pih Entered By: Angelina Pih on 09/05/2022 08:46:08 -------------------------------------------------------------------------------- Education Screening Details Patient Name: Date of Service: Lori Dubin. 09/05/2022 8:15 A M Medical Record Number: [id number] Patient Account Number: [id number] Date of Birth/Sex: Treating RN: Mar 24, 1965 (57 y.o. [name] Primary Care Brittanny Levenhagen: Mirna Mires Other Clinician: Referring Iriel Nason: Treating Keyston Ardolino/Extender: Allen Derry Self, Referral Weeks in Treatment: 0 Learning Preferences/Education Level/Primary Language Learning Preference: Explanation, Demonstration, Video, Communication Board, Printed Material Preferred Language: English Cognitive Barrier Language Barrier: No Translator Needed: No Memory Deficit: No Emotional Barrier: No Cultural/Religious Beliefs Affecting Medical Care: No Physical Barrier Impaired Vision: No Impaired Hearing: No Decreased Hand dexterity: No Knowledge/Comprehension Knowledge Level: High Comprehension Level: High Ability to understand written instructions: High Ability to understand verbal instructions: High Motivation Anxiety Level: Calm Cooperation: Cooperative Education Importance: Acknowledges Need Interest in Health Problems: Asks Questions Perception: Coherent Willingness to Engage in Self-Management High Activities: Readiness to Engage in Self-Management High Activities: Electronic Signature(s) Signed: 09/05/2022 3:32:36 PM By: Angelina Pih Entered By: Angelina Pih on 09/05/2022 08:46:28 [name], [name] ([id number]) [id number] Page 3 of 5 -------------------------------------------------------------------------------- Fall Risk Assessment Details Patient Name: Date of Service: [name], [name] 09/05/2022 8:15 A  M Medical Record Number: [id number] Patient Account Number: [id number] Date of Birth/Sex: Treating RN: 07-03-1965 (57 y.o. [name] Primary Care Jacy Howat: Mirna Mires Other Clinician: Referring Ajayla Iglesias: Treating Mauriana Dann/Extender: Allen Derry Self, Referral Weeks in Treatment: 0 Fall Risk Assessment Items Have you had 2 or more falls in the last 12 monthso 0 No Have you had any fall that resulted in injury in the last 12 monthso 0 No FALLS RISK SCREEN History of falling - immediate or within 3 months 0 No Secondary diagnosis (Do you have 2 or more medical diagnoseso) 0 No Ambulatory aid None/bed rest/wheelchair/nurse 0 Yes Crutches/cane/walker 0 No Furniture 0 No Intravenous therapy Access/Saline/Heparin  Lock 0 No Gait/Transferring Normal/ bed rest/ wheelchair 0 Yes Weak (short steps with or without shuffle, stooped but able to lift head while walking, may seek 0 No support from furniture) Impaired (short steps with shuffle, may have difficulty arising from chair, head down, impaired 0 No balance) Mental Status Oriented to own ability 0 Yes Electronic Signature(s) Signed: 09/05/2022 3:32:36 PM By: Angelina Pih Entered By: Angelina Pih on 09/05/2022 08:46:35 -------------------------------------------------------------------------------- Foot Assessment Details Patient Name: Date of Service: Lori Dubin 09/05/2022 8:15 A M Medical Record Number: [id number] Patient Account Number: [id number] Date of Birth/Sex: Treating RN: 1965/08/27 (57 y.o. [name] Primary Care Murel Shenberger: Mirna Mires Other Clinician: Referring Loudon Krakow: Treating Tanicka Bisaillon/Extender: Allen Derry Self, Referral Weeks in Treatment: 0 Foot Assessment Items Site Locations [place], [place] [name] ([id number]) [id number] [id number] Page 4 of 5 + = Sensation present, - = Sensation absent, C = Callus, U = Ulcer [name] = Redness, W = Warmth, M = Maceration, PU =  Pre-ulcerative lesion F = Fissure, S = Swelling, D = Dryness Assessment Right: Left: Other Deformity: No No Prior Foot Ulcer: No No Prior Amputation: No No Charcot Joint: No No Ambulatory Status: Ambulatory Without Help Gait: Steady Electronic Signature(s) Signed: 09/05/2022 3:32:36 PM By: Angelina Pih Entered By: Angelina Pih on 09/05/2022 08:54:22 -------------------------------------------------------------------------------- Nutrition Risk Screening Details Patient Name: Date of Service: [name], [name] 09/05/2022 8:15 A M Medical Record Number: [id number] Patient Account Number: [id number] Date of Birth/Sex: Treating RN: 1965-05-11 (57 y.o. [name] Primary Care Adriannah Steinkamp: Mirna Mires Other Clinician: Referring Payden Bonus: Treating Mickie Kozikowski/Extender: Allen Derry Self, Referral Weeks in Treatment: 0 Height (in): 60 Weight (lbs): 235 Body Mass Index (BMI): 45.9 Nutrition Risk Screening Items Score Screening NUTRITION RISK SCREEN: I have an illness or condition that made me change the kind and/or amount of food I eat 0 No I eat fewer than two meals per day 0 No I eat few fruits and vegetables, or milk products 0 No I have three or more drinks of beer, liquor or wine almost every day 0 No I have tooth or mouth problems that make it hard for me to eat 0 No I don't always have enough money to buy the food I need 0 No [name], [name] ([id number]) [id number] [id number] Page 5 of 5 I eat alone most of the time 0 No I take three or more different prescribed or over-the-counter drugs a day 0 No Without wanting to, I have lost or gained 10 pounds in the last six months 0 No I am not always physically able to shop, cook and/or feed myself 0 No Nutrition Protocols Good Risk Protocol 0 No interventions needed Moderate Risk Protocol High Risk Proctocol Risk Level: Good Risk Score: 0 Electronic Signature(s) Signed: 09/05/2022 3:32:36 PM  By: Angelina Pih Entered By: Angelina Pih on 09/05/2022 08:46:43

## 2022-09-05 NOTE — Progress Notes (Addendum)
CHINELLE, ORDONES ([id number]) [id number] Page 1 of 10 Visit Report for 09/05/2022 Allergy List Details Patient Name: Date of Service: [name], [name] 09/05/2022 8:15 A M Medical Record Number: [id number] Patient Account Number: [id number] Date of Birth/Sex: Treating RN: [birth year] (57 y.o. [name] Primary Care Meriam Chojnowski: Mirna Mires Other Clinician: Referring Marlayna Bannister: Treating Kaynen Minner/Extender: Allen Derry Self, Referral Weeks in Treatment: 0 Allergies Active Allergies No Known Allergies Allergy Notes Electronic Signature(s) Signed: 09/05/2022 3:32:36 PM By: Angelina Pih Entered By: Angelina Pih on 09/05/2022 08:41:09 -------------------------------------------------------------------------------- Arrival Information Details Patient Name: Date of Service: [name]. 09/05/2022 8:15 A M Medical Record Number: [id number] Patient Account Number: [id number] Date of Birth/Sex: Treating RN: Jul 27, 1965 (57 y.o. [name] Primary Care Debora Stockdale: Mirna Mires Other Clinician: Referring Zyriah Mask: Treating Layan Zalenski/Extender: Allen Derry Self, Referral Weeks in Treatment: 0 Visit Information Patient Arrived: Ambulatory Arrival Time: 08:35 Accompanied By: self Transfer Assistance: None Patient Identification Verified: Yes Secondary Verification Process Completed: Yes Patient Has Alerts: Yes Patient Alerts: type 2 diabetic LLL ABI [place] History Since Last Visit Added or deleted any medications: No Any new allergies or adverse reactions: No Had a fall or experienced change in activities of daily living that may affect risk of falls: No Hospitalized since last visit: No Has Dressing in Place as Prescribed: Yes Electronic Signature(s) Signed: 09/05/2022 3:39:22 PM By: Angelina Pih Previous Signature: 09/05/2022 3:32:36 PM Version By: Ramon Dredge, [facility] R ([id number]) [id number]  Page 2 of 10 Entered By: Angelina Pih on 09/05/2022 15:39:21 -------------------------------------------------------------------------------- Clinic Level of Care Assessment Details Patient Name: Date of Service: MITZI, BARNEY 09/05/2022 8:15 A M Medical Record Number: [id number] Patient Account Number: [id number] Date of Birth/Sex: Treating RN: Dec 02, 1965 (57 y.o. [name] Primary Care Jaycub Noorani: Mirna Mires Other Clinician: Referring Jesusita Jocelyn: Treating Margie Brink/Extender: Allen Derry Self, Referral Weeks in Treatment: 0 Clinic Level of Care Assessment Items TOOL 1 Quantity Score []  - 0 Use when EandM and Procedure is performed on INITIAL visit ASSESSMENTS - Nursing Assessment / Reassessment X- 1 20 General Physical Exam (combine w/ comprehensive assessment (listed just below) when performed on new pt. evals) X- 1 25 Comprehensive Assessment (HX, ROS, Risk Assessments, Wounds Hx, etc.) ASSESSMENTS - Wound and Skin Assessment / Reassessment []  - 0 Dermatologic / Skin Assessment (not related to wound area) ASSESSMENTS - Ostomy and/or Continence Assessment and Care []  - 0 Incontinence Assessment and Management []  - 0 Ostomy Care Assessment and Management (repouching, etc.) PROCESS - Coordination of Care X - Simple Patient / Family Education for ongoing care 1 15 []  - 0 Complex (extensive) Patient / Family Education for ongoing care X- 1 10 Staff obtains Chiropractor, Records, T Results / Process Orders est []  - 0 Staff telephones HHA, Nursing Homes / Clarify orders / etc []  - 0 Routine Transfer to another Facility (non-emergent condition) []  - 0 Routine Hospital Admission (non-emergent condition) X- 1 15 New Admissions / Manufacturing engineer / Ordering NPWT Apligraf, etc. , []  - 0 Emergency Hospital Admission (emergent condition) PROCESS - Special Needs []  - 0 Pediatric / Minor Patient Management []  - 0 Isolation Patient Management []  - 0 Hearing  / Language / Visual special needs []  - 0 Assessment of Community assistance (transportation, D/C planning, etc.) []  - 0 Additional assistance / Altered mentation []  - 0 Support Surface(s) Assessment (bed, cushion, seat, etc.) INTERVENTIONS - Miscellaneous []  - 0 External ear exam []  - 0 Patient Transfer (multiple staff / Nurse, adult / Similar devices) []  -  0 Simple Staple / Suture removal (25 or less) []  - 0 Complex Staple / Suture removal (26 or more) Lingerfelt, Georjean R ([id number]) [id number] Page 3 of 10 []  - 0 Hypo/Hyperglycemic Management (do not check if billed separately) X- 1 15 Ankle / Brachial Index (ABI) - do not check if billed separately Has the patient been seen at the hospital within the last three years: Yes Total Score: 100 Level Of Care: New/Established - Level 3 Electronic Signature(s) Signed: 09/05/2022 3:32:36 PM By: Angelina Pih Entered By: Angelina Pih on 09/05/2022 09:50:30 -------------------------------------------------------------------------------- Compression Therapy Details Patient Name: Date of Service: AKHILA, ODONOHUE 09/05/2022 8:15 A M Medical Record Number: [id number] Patient Account Number: [id number] Date of Birth/Sex: Treating RN: [birth year] (57 y.o. [name] Primary Care Olukemi Panchal: Mirna Mires Other Clinician: Referring Enrrique Mierzwa: Treating Casy Tavano/Extender: Allen Derry Self, Referral Weeks in Treatment: 0 Compression Therapy Performed for Wound Assessment: Wound #3 Left,Medial Lower Leg Performed By: Clinician Angelina Pih, RN Compression Type: Double Layer Post Procedure Diagnosis Same as Pre-procedure Electronic Signature(s) Signed: 09/05/2022 3:32:36 PM By: Angelina Pih Entered By: Angelina Pih on 09/05/2022 09:34:13 -------------------------------------------------------------------------------- Encounter Discharge Information Details Patient Name: Date of  Service: [name]. 09/05/2022 8:15 A M Medical Record Number: [id number] Patient Account Number: [id number] Date of Birth/Sex: Treating RN: 1965/09/09 (57 y.o. [name] Primary Care Nahum Sherrer: Mirna Mires Other Clinician: Referring Kizzy Olafson: Treating Brandin Dilday/Extender: Allen Derry Self, Referral Weeks in Treatment: 0 Encounter Discharge Information Items Post Procedure Vitals Discharge Condition: Stable Temperature (F): 97.6 Ambulatory Status: Ambulatory Pulse (bpm): 62 Discharge Destination: Home Respiratory Rate (breaths/min): 18 Transportation: Private Auto Blood Pressure (mmHg): 139/81 Accompanied By: self Schedule Follow-up AppointmentNEHEMIAH, ZADRA ([id number]) [id number] Page 4 of 10 Clinical Summary of Care: Electronic Signature(s) Signed: 09/05/2022 2:22:24 PM By: Angelina Pih Previous Signature: 09/05/2022 9:54:58 AM Version By: Angelina Pih Entered By: Angelina Pih on 09/05/2022 14:22:24 -------------------------------------------------------------------------------- Lower Extremity Assessment Details Patient Name: Date of Service: KEVIA, LOBATOS 09/05/2022 8:15 A M Medical Record Number: [id number] Patient Account Number: [id number] Date of Birth/Sex: Treating RN: [birth year] (57 y.o. [name] Primary Care Cariah Salatino: Mirna Mires Other Clinician: Referring Meleni Delahunt: Treating Alvera Tourigny/Extender: Allen Derry Self, Referral Weeks in Treatment: 0 Edema Assessment Assessed: [Left: No] [Right: No] Edema: [Left: Ye] [Right: s] Calf Left: Right: Point of Measurement: 32 cm From Medial Instep 52 cm Ankle Left: Right: Point of Measurement: 12 cm From Medial Instep 31.3 cm Vascular Assessment Pulses: Dorsalis Pedis Palpable: [Left:Yes] Notes LLL ABI Clarence 09/05/22 Electronic Signature(s) Signed: 09/05/2022 3:32:36 PM By: Angelina Pih Entered By: Angelina Pih on 09/05/2022  08:55:16 -------------------------------------------------------------------------------- Multi Wound Chart Details Patient Name: Date of Service: Johnanna Schneiders R. 09/05/2022 8:15 A Burnard Bunting, [place] R ([id number]) [id number] Page 5 of 10 Medical Record Number: [id number] Patient Account Number: [id number] Date of Birth/Sex: Treating RN: 05/15/1965 (57 y.o. [name] Primary Care Sherrel Ploch: Mirna Mires Other Clinician: Referring Elmo Rio: Treating Latrice Storlie/Extender: Allen Derry Self, Referral Weeks in Treatment: 0 Vital Signs Height(in): 60 Pulse(bpm): 62 Weight(lbs): 235 Blood Pressure(mmHg): 139/81 Body Mass Index(BMI): 45.9 Temperature(F): 97.6 Respiratory Rate(breaths/min): 18 [3:Photos:] [N/A:N/A] Left, Medial Lower Leg N/A N/A Wound Location: Gradually Appeared N/A N/A Wounding Event: Lymphedema N/A N/A Primary Etiology: Cataracts, Lymphedema, Asthma, N/A N/A Comorbid History: Congestive Heart Failure, Hypertension, Type II Diabetes, Osteoarthritis, Neuropathy 07/30/2022 N/A N/A Date Acquired: 0 N/A N/A Weeks of Treatment: Open N/A N/A Wound Status: No N/A N/A Wound Recurrence: Yes N/A N/A Clustered Wound: 4 N/A N/A Clustered Quantity:  6.5x2.5x0.1 N/A N/A Measurements L x W x D (cm) 12.763 N/A N/A A (cm) : rea 1.276 N/A N/A Volume (cm) : Partial Thickness N/A N/A Classification: Large N/A N/A Exudate A mount: Serous N/A N/A Exudate Type: amber N/A N/A Exudate Color: Small (1-33%) N/A N/A Granulation A mount: Red, Pink N/A N/A Granulation Quality: Large (67-100%) N/A N/A Necrotic A mount: None N/A N/A Epithelialization: Treatment Notes Electronic Signature(s) Signed: 09/05/2022 3:32:36 PM By: Angelina Pih Entered By: Angelina Pih on 09/05/2022 09:15:36 -------------------------------------------------------------------------------- Multi-Disciplinary Care Plan Details Patient Name: Date of  Service: Johnanna Schneiders R. 09/05/2022 8:15 A M Medical Record Number: [id number] Patient Account Number: [id number] Date of Birth/Sex: Treating RN: Dec 01, 1965 (57 y.o. [name] Primary Care Larrie Lucia: Mirna Mires Other Clinician: Referring Charon Akamine: Treating Micca Matura/Extender: Allen Derry Self, Referral [place], [facility] R ([id number]) [id number] Page 6 of 10 Weeks in Treatment: 0 Active Inactive Necrotic Tissue Nursing Diagnoses: Impaired tissue integrity related to necrotic/devitalized tissue Knowledge deficit related to management of necrotic/devitalized tissue Goals: Necrotic/devitalized tissue will be minimized in the wound bed Date Initiated: 09/05/2022 Target Resolution Date: 09/12/2022 Goal Status: Active Patient/caregiver will verbalize understanding of reason and process for debridement of necrotic tissue Date Initiated: 09/05/2022 Target Resolution Date: 09/12/2022 Goal Status: Active Interventions: Assess patient pain level pre-, during and post procedure and prior to discharge Provide education on necrotic tissue and debridement process Treatment Activities: Apply topical anesthetic as ordered : 09/05/2022 Excisional debridement : 09/05/2022 Notes: Venous Leg Ulcer Nursing Diagnoses: Knowledge deficit related to disease process and management Potential for venous Insuffiency (use before diagnosis confirmed) Goals: Patient will maintain optimal edema control Date Initiated: 09/05/2022 Target Resolution Date: 10/03/2022 Goal Status: Active Patient/caregiver will verbalize understanding of disease process and disease management Date Initiated: 09/05/2022 Target Resolution Date: 09/12/2022 Goal Status: Active Interventions: Assess peripheral edema status every visit. Compression as ordered Treatment Activities: Therapeutic compression applied : 09/05/2022 Notes: Wound/Skin Impairment Nursing Diagnoses: Impaired tissue  integrity Knowledge deficit related to ulceration/compromised skin integrity Goals: Ulcer/skin breakdown will have a volume reduction of 30% by week 4 Date Initiated: 09/05/2022 Target Resolution Date: 10/03/2022 Goal Status: Active Ulcer/skin breakdown will have a volume reduction of 50% by week 8 Date Initiated: 09/05/2022 Target Resolution Date: 10/31/2022 Goal Status: Active Ulcer/skin breakdown will have a volume reduction of 80% by week 12 Date Initiated: 09/05/2022 Target Resolution Date: 11/28/2022 Goal Status: Active Ulcer/skin breakdown will heal within 14 weeks Date Initiated: 09/05/2022 Target Resolution Date: 12/12/2022 Goal Status: Active Interventions: Assess patient/caregiver ability to obtain necessary supplies Assess patient/caregiver ability to perform ulcer/skin care regimen upon admission and as needed Assess ulceration(s) every visit MATALYN, HOOK R ([id number]) [id number] Page 7 of 10 Provide education on ulcer and skin care Treatment Activities: Skin care regimen initiated : 09/05/2022 Notes: Electronic Signature(s) Signed: 09/05/2022 9:53:15 AM By: Angelina Pih Entered By: Angelina Pih on 09/05/2022 09:53:15 -------------------------------------------------------------------------------- Pain Assessment Details Patient Name: Date of Service: IVALEE, AMABILE 09/05/2022 8:15 A M Medical Record Number: [id number] Patient Account Number: [id number] Date of Birth/Sex: Treating RN: [birth year] (57 y.o. [name] Primary Care Jalissa Heinzelman: Mirna Mires Other Clinician: Referring Tetsuo Coppola: Treating Menna Abeln/Extender: Allen Derry Self, Referral Weeks in Treatment: 0 Active Problems Location of Pain Severity and Description of Pain Patient Has Paino Yes Site Locations Rate the pain. Current Pain Level: 8 Pain Management and Medication Current Pain Management: Notes tingling pain per pt Electronic Signature(s) Signed:  09/05/2022 3:32:36 PM By: Angelina Pih Entered By: Angelina Pih on 09/05/2022 08:39:08 [place]  R ([id number]) [id number] Page 8 of 10 -------------------------------------------------------------------------------- Patient/Caregiver Education Details Patient Name: Date of Service: NIDIA, FEATHER 7/8/2024andnbsp8:15 A M Medical Record Number: [id number] Patient Account Number: [id number] Date of Birth/Gender: Treating RN: 11/11/1965 (57 y.o. [name] Primary Care Physician: Mirna Mires Other Clinician: Referring Physician: Treating Physician/Extender: Allen Derry Self, Referral Weeks in Treatment: 0 Education Assessment Education Provided To: Patient Education Topics Provided Venous: Handouts: Controlling Swelling with Compression Stockings Methods: Explain/Verbal Responses: State content correctly Welcome T [facility]New [facility] Packet: o Handouts: [facility] P[facility] form, Welcome T [facility] o Methods: Explain/Verbal Responses: State content correctly Wound Debridement: Handouts: Wound Debridement Methods: Explain/Verbal Responses: State content correctly Wound/Skin Impairment: Handouts: Caring for Your Ulcer Methods: Explain/Verbal Responses: State content correctly Electronic Signature(s) Signed: 09/05/2022 3:32:36 PM By: Angelina Pih Entered By: Angelina Pih on 09/05/2022 09:54:10 -------------------------------------------------------------------------------- Wound Assessment Details Patient Name: Date of Service: LAFREDA, OUIMETTE 09/05/2022 8:15 A M Medical Record Number: [id number] Patient Account Number: [id number] Date of Birth/Sex: Treating RN: October 16, 1965 (57 y.o. [name] Primary Care Izella Ybanez: Mirna Mires Other Clinician: Referring Jeaneane Adamec: Treating Shawnte Winton/Extender: Allen Derry Self, Referral Weeks in TreatmentJasleene Janson, [place] R  ([id number]) [id number] Page 9 of 10 Wound Status Wound Number: 3 Primary Lymphedema Etiology: Wound Location: Left, Medial Lower Leg Wound Open Wounding Event: Gradually Appeared Status: Date Acquired: 07/30/2022 Comorbid Cataracts, Lymphedema, Asthma, Congestive Heart Failure, Weeks Of Treatment: 0 History: Hypertension, Type II Diabetes, Osteoarthritis, Neuropathy Clustered Wound: Yes Photos Wound Measurements Length: (cm) 6.5 Width: (cm) 2.5 Depth: (cm) 0.1 Clustered Quantity: 4 Area: (cm) 12.763 Volume: (cm) 1.276 % Reduction in Area: % Reduction in Volume: Epithelialization: None Tunneling: No Undermining: No Wound Description Classification: Exudate Amount: Exudate Type: Exudate Color: Partial Thickness Large Serous amber Wound Bed Granulation Amount: Small (1-33%) Granulation Quality: Red, Pink Necrotic Amount: Large (67-100%) Necrotic Quality: Adherent Slough Treatment Notes Wound #3 (Lower Leg) Wound Laterality: Left, Medial Cleanser Soap and Water Discharge Instruction: Gently cleanse wound with antibacterial soap, rinse and pat dry prior to dressing wounds Peri-Wound Care Topical Primary Dressing Silvercel 4 1/4x 4 1/4 (in/in) Discharge Instruction: Apply Silvercel 4 1/4x 4 1/4 (in/in) as instructed Secondary Dressing Zetuvit Plus 4x8 (in/in) Secured With Compression Wrap Urgo K2, two layer compression system, large Compression Stockings Add-Ons Electronic Signature(s) Signed: 09/05/2022 3:32:36 PM By: Angelina Pih Entered By: Angelina Pih on 09/05/2022 09:00:02 [place], Jennifr R ([id number]) [id number] Page 10 of 10 -------------------------------------------------------------------------------- Vitals Details Patient Name: Date of Service: SHUNTEL, JENDRO 09/05/2022 8:15 A M Medical Record Number: [id number] Patient Account Number: [id number] Date of Birth/Sex: Treating  RN: Jul 02, 1965 (57 y.o. [name] Primary Care Elie Gragert: Mirna Mires Other Clinician: Referring Graiden Henes: Treating Fernado Brigante/Extender: Allen Derry Self, Referral Weeks in Treatment: 0 Vital Signs Time Taken: 08:39 Temperature (F): 97.6 Height (in): 60 Pulse (bpm): 62 Source: Stated Respiratory Rate (breaths/min): 18 Weight (lbs): 235 Blood Pressure (mmHg): 139/81 Source: Stated Reference Range: 80 - 120 mg / dl Body Mass Index (BMI): 45.9 Electronic Signature(s) Signed: 09/05/2022 3:32:36 PM By: Angelina Pih Entered By: Angelina Pih on 09/05/2022 08:40:59

## 2022-09-05 NOTE — Progress Notes (Addendum)
[name], [name] ([id number]) [id number] Page 1 of 11 Visit Report for 09/05/2022 Chief Complaint Document Details Patient Name: Date of Service: [name], [name] 09/05/2022 8:15 A M Medical Record Number: [id number] Patient Account Number: [id number] Date of Birth/Sex: Treating RN: January 22, 1966 (57 y.o. [name] Primary Care Provider: Mirna Mires Other Clinician: Referring Provider: Treating Provider/Extender: [place] in Treatment: 0 Information Obtained from: Patient Chief Complaint Left LE Ulcer Electronic Signature(s) Signed: 09/05/2022 9:13:55 AM By: Allen Derry PA-C Previous Signature: 09/05/2022 8:49:49 AM Version By: Allen Derry PA-C Entered By: Allen Derry on 09/05/2022 09:13:55 -------------------------------------------------------------------------------- Debridement Details Patient Name: Date of Service: [name]. 09/05/2022 8:15 A M Medical Record Number: [id number] Patient Account Number: [id number] Date of Birth/Sex: Treating RN: 10-31-1965 (57 y.o. [name] Primary Care Provider: Mirna Mires Other Clinician: Referring Provider: Treating Provider/Extender: [place] in Treatment: 0 Debridement Performed for Assessment: Wound #3 Left,Medial Lower Leg Performed By: Physician Allen Derry, PA-C Debridement Type: Debridement Level of Consciousness (Pre-procedure): Awake and Alert Pre-procedure Verification/Time Out Yes - 09:15 Taken: Pain Control: Lidocaine 4% T opical Solution Percent of Wound Bed Debrided: 100% T Area Debrided (cm): otal 12.76 Tissue and other material debrided: Viable, Non-Viable, Slough, Subcutaneous, Slough Level: Skin/Subcutaneous Tissue Debridement Description: Excisional Instrument: Curette Bleeding: Minimum Hemostasis Achieved: Pressure Response to Treatment: Procedure was tolerated well Level of Consciousness (Post- Awake and  Alert procedure): [name], [name] ([id number]) [id number] Page 2 of 11 Post Debridement Measurements of Total Wound Length: (cm) 6.5 Width: (cm) 2.5 Depth: (cm) 0.1 Volume: (cm) 1.276 Character of Wound/Ulcer Post Debridement: Stable Post Procedure Diagnosis Same as Pre-procedure Electronic Signature(s) Signed: 09/05/2022 3:32:36 PM By: Angelina Pih Signed: 09/05/2022 5:05:34 PM By: Allen Derry PA-C Entered By: Angelina Pih on 09/05/2022 09:18:39 -------------------------------------------------------------------------------- HPI Details Patient Name: Date of Service: [name]. 09/05/2022 8:15 A M Medical Record Number: [id number] Patient Account Number: [id number] Date of Birth/Sex: Treating RN: [birth year] (57 y.o. [name] Primary Care Provider: Mirna Mires Other Clinician: Referring Provider: Treating Provider/Extender: [place] in Treatment: 0 History of Present Illness HPI Description: 10/20/2020 upon evaluation today patient appears to be doing somewhat poorly in regard to her left leg where she has a significant wound although not very deep but this is definitely due to her chronic swelling. Fortunately there does not appear to be any signs of active infection at this time which is great news. There was a lot of scabbing going on and then she had areas that were somewhat open up underneath. It does appear to be very lymphedema like in nature. With that being said I do not see any signs of active infection currently. I do think that based on what we are seeing at the moment this is likely an area that will respond well to appropriate compression therapy. If it does not then we will need to reevaluate the situation for sure. The patient does have a history of diabetes mellitus type 2, chronic venous insufficiency, lymphedema, chronic kidney disease stage III, and congestive heart failure. She is status  post bariatric surgery as well. Overall I am pleased with where things stand I think she does need to definitely have compression in place. This wound has been present for about 4 to 6 weeks she tells me. 10/29/2020 upon evaluation today patient actually appears to be doing much better. She has been tolerating the dressing changes without complication and the compression wrap did slide down  her a bit but to be perfectly honest I am very pleased overall with well the wound appears today. I do not see any signs of active infection which is also great news. Nonetheless I think that we are definitely headed in the appropriate direction as far as healing is concerned and in fact this is significantly improved even as compared to just last week. 11/06/2020 upon evaluation today patient appears to be doing well with regard to her wounds. In fact everything appears to be completely healed which is great news. I am extremely pleased with where things stand today. No fevers, chills, nausea, vomiting, or diarrhea. 02/17/2021 [name] is a 57 year old female with a past medical history of insulin-dependent type 2 diabetes, CKD stage III, bariatric surgery and lymphedema that presents to the clinic because she would like to order lymphedema pumps. She was seen in our clinic in August and September for an open wound to her left lower extremity due to lymphedema. She was treated With 4-layer compression for 2-1/2 weeks with resolution of her wound. She has not developed wounds since her discharge in September. She has compression stockings but states she cannot put these on daily. Readmission 03/31/2021 Patient presents with an open wound today. She was last seen on 02/17/2021 for lymphedema pump order. She states that she is working with insurance to get these covered. She still has not received them. She also has not been using compression stockings daily. She states these are difficult to put on.  She developed a wound 2 weeks ago and has been keeping the area covered. She currently denies signs of infection. 2/8; patient presents for follow-up. She reports receiving lymphedema pumps however these were the wrong size. She states she is getting these replaced and sent in the mail soon. She did receive her juxta lite compression. She had no issues or complaints with the compression wrap this past week. She denies signs of infection. 2/15; patient presents for follow-up. She reports obtaining the correct size lymphedema pumps and started using them yesterday. She has no issues or complaints today. She denies signs of infection. 2/22; patient presents for follow-up. She has no issues or complaints today. 3/1; patient presents for follow-up. She has no issues or complaints today. She brought her juxta light compression with her. [name], [name] ([id number]) [id number] Page 3 of 11 Readmission: 09-05-2022 upon evaluation today patient presents for initial evaluation here in our clinic concerning issues that she is having with left medial lower extremity wound. This does appear to be secondary to lymphedema. This has been open for what appears to be roughly 5 or so weeks beginning around the beginning of June 2024. Fortunately there does not appear to be any signs of significant infection overall this is great news. With that being said the patient does seem to have some signs of drainage and discomfort which is not noted at this point. She does have a history of lymphedema, diabetes mellitus type 2, chronic venous hypertension, chronic kidney disease stage III, congestive heart failure, and status post bariatric surgery. Electronic Signature(s) Signed: 09/12/2022 1:40:41 PM By: Allen Derry PA-C Previous Signature: 09/06/2022 6:55:08 PM Version By: Allen Derry PA-C Entered By: Allen Derry on 09/12/2022  13:40:41 -------------------------------------------------------------------------------- Physical Exam Details Patient Name: Date of Service: [name], [name] 09/05/2022 8:15 A M Medical Record Number: [id number] Patient Account Number: [id number] Date of Birth/Sex: Treating RN: 03/22/1965 (57 y.o. [name] Primary Care Provider: Mirna Mires Other Clinician: Referring Provider: Treating Provider/Extender:  Stone, [place], Earvin Hansen Weeks in Treatment: 0 Constitutional sitting or standing blood pressure is within target range for patient.. pulse regular and within target range for patient.Marland Kitchen respirations regular, non-labored and within target range for patient.Marland Kitchen temperature within target range for patient.. Obese and well-hydrated in no acute distress. Eyes conjunctiva clear no eyelid edema noted. pupils equal round and reactive to light and accommodation. Ears, Nose, Mouth, and Throat no gross abnormality of ear auricles or external auditory canals. normal hearing noted during conversation. mucus membranes moist. Respiratory normal breathing without difficulty. Cardiovascular 1+ dorsalis pedis/posterior tibialis pulses. Patient does have bilateral nonpitting lymphedema.. Musculoskeletal normal gait and posture. no significant deformity or arthritic changes, no loss or range of motion, no clubbing. Psychiatric this patient is able to make decisions and demonstrates good insight into disease process. Alert and Oriented x 3. pleasant and cooperative. Notes Upon inspection patient's wound bed actually showed signs again of having a wound which is a present at this point that does appear to show some signs of necrotic debris this is very minimal however I did sharp debridement clearway necrotic debris which she tolerated well today and postdebridement the wound bed is significantly improved. Electronic Signature(s) Signed: 09/12/2022 1:42:40 PM By: Allen Derry PA-C Entered By:  Allen Derry on 09/12/2022 13:42:40 Philbert, Toniann Fail [name] ([id number]) [id number] Page 4 of 11 -------------------------------------------------------------------------------- Physician Orders Details Patient Name: Date of Service: [name], [name] 09/05/2022 8:15 A M Medical Record Number: [id number] Patient Account Number: [id number] Date of Birth/Sex: Treating RN: 01/28/1966 (57 y.o. [name] Primary Care Provider: Mirna Mires Other Clinician: Referring Provider: Treating Provider/Extender: [place] in Treatment: 0 Verbal / Phone Orders: No Diagnosis Coding ICD-10 Coding Code Description I89.0 Lymphedema, not elsewhere classified E11.622 Type 2 diabetes mellitus with other skin ulcer I87.332 Chronic venous hypertension (idiopathic) with ulcer and inflammation of left lower extremity L97.822 Non-pressure chronic ulcer of other part of left lower leg with fat layer exposed N18.30 Chronic kidney disease, stage 3 unspecified 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.84 Bariatric surgery status Follow-up Appointments Return Appointment in 1 week. Nurse Visit as needed - new compression wrap placed, NV Friday to check and re-wrap Bathing/ Shower/ Hygiene May shower; gently cleanse wound with antibacterial soap, rinse and pat dry prior to dressing wounds No tub bath. Anesthetic (Use 'Patient Medications' Section for Anesthetic Order Entry) Lidocaine applied to wound bed Edema Control - Lymphedema / Segmental Compressive Device / Other UrgoK2 - size large used Elevate, Exercise Daily and A void Standing for Long Periods of Time. Elevate leg(s) parallel to the floor when sitting. Compression Pump: Use compression pump on left lower extremity for 60 minutes, twice daily. - you may start using again after your NV to check wrap and re-wrap. DO YOUR BEST to sleep in the  bed at night. DO NOT sleep in your recliner. Long hours of sitting in a recliner leads to swelling of the legs and/or potential wounds on your backside. Wound Treatment Wound #3 - Lower Leg Wound Laterality: Left, Medial Cleanser: Soap and Water 2 x Per Week/30 Days Discharge Instructions: Gently cleanse wound with antibacterial soap, rinse and pat dry prior to dressing wounds Prim Dressing: Silvercel 4 1/4x 4 1/4 (in/in) 2 x Per Week/30 Days ary Discharge Instructions: Apply Silvercel 4 1/4x 4 1/4 (in/in) as instructed Secondary Dressing: Zetuvit Plus 4x8 (in/in) 2 x Per Week/30 Days Compression Wrap: Urgo K2, two layer compression system, large 2 x  Per Week/30 Days Electronic Signature(s) Signed: 09/05/2022 3:32:36 PM By: Angelina Pih Signed: 09/05/2022 5:05:34 PM By: Allen Derry PA-C Entered By: Angelina Pih on 09/05/2022 09:49:10 Joos, Toniann Fail [name] ([id number]) [id number] Page 5 of 11 -------------------------------------------------------------------------------- Problem List Details Patient Name: Date of Service: [name], [name] 09/05/2022 8:15 A M Medical Record Number: [id number] Patient Account Number: [id number] Date of Birth/Sex: Treating RN: 22-Nov-1965 (57 y.o. 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/09/2022 No Yes N18.30 Chronic kidney disease, stage 3 unspecified 09/05/2022 No Yes I50.42 Chronic  combined systolic (congestive) and diastolic (congestive) heart failure 09/05/2022 No Yes Z98.84 Bariatric surgery status 09/05/2022 No Yes Inactive Problems Resolved Problems Electronic Signature(s) Signed: 09/05/2022 9:13:35 AM By: Allen Derry PA-C Previous Signature: 09/05/2022 8:49:43 AM Version By: Allen Derry PA-C Entered By: Allen Derry on 09/05/2022 09:13:35 Bentz, Yalda [name] ([id number]) [id number] Page 6 of 11 -------------------------------------------------------------------------------- Progress Note Details Patient Name: Date of Service: [name], [name] 09/05/2022 8:15 A M Medical Record Number: [id number] Patient Account Number: [id number] Date of Birth/Sex: Treating RN: 1966/02/07 (57 y.o. [name] Primary Care Provider: Mirna Mires Other Clinician: Referring Provider: Treating Provider/Extender: [place] in Treatment: 0 Subjective Chief Complaint Information obtained from Patient Left LE Ulcer History of Present Illness (HPI) 10/20/2020 upon evaluation today patient appears to be doing somewhat poorly in regard to her left leg where she has a significant wound although not very deep but this is definitely due to her chronic swelling. Fortunately there does not appear to be any signs of active infection at this time which is great news. There was a lot of scabbing going on and then she had areas that were somewhat open up underneath. It does appear to be very lymphedema like in nature. With that being said I do not see any signs of active infection currently. I do think that based on what we are seeing at the moment this is likely an area that will respond well to appropriate compression therapy. If it does not then we will need to reevaluate the situation for sure. The patient does have a history of diabetes mellitus type 2, chronic venous insufficiency, lymphedema, chronic kidney disease stage III, and  congestive heart failure. She is status post bariatric surgery as well. Overall I am pleased with where things stand I think she does need to definitely have compression in place. This wound has been present for about 4 to 6 weeks she tells me. 10/29/2020 upon evaluation today patient actually appears to be doing much better. She has been tolerating the dressing changes without complication and the compression wrap did slide down her a bit but to be perfectly honest I am very pleased overall with well the wound appears today. I do not see any signs of active infection which is also great news. Nonetheless I think that we are definitely headed in the appropriate direction as far as healing is concerned and in fact this is significantly improved even as compared to just last week. 11/06/2020 upon evaluation today patient appears to be doing well  with regard to her wounds. In fact everything appears to be completely healed which is great news. I am extremely pleased with where things stand today. No fevers, chills, nausea, vomiting, or diarrhea. 02/17/2021 Ms. Purity Irmen is a 57 year old female with a past medical history of insulin-dependent type 2 diabetes, CKD stage III, bariatric surgery and lymphedema that presents to the clinic because she would like to order lymphedema pumps. She was seen in our clinic in August and September for an open wound to her left lower extremity due to lymphedema. She was treated With 4-layer compression for 2-1/2 weeks with resolution of her wound. She has not developed wounds since her discharge in September. She has compression stockings but states she cannot put these on daily. Readmission 03/31/2021 Patient presents with an open wound today. She was last seen on 02/17/2021 for lymphedema pump order. She states that she is working with insurance to get these covered. She still has not received them. She also has not been using compression stockings daily. She  states these are difficult to put on. She developed a wound 2 weeks ago and has been keeping the area covered. She currently denies signs of infection. 2/8; patient presents for follow-up. She reports receiving lymphedema pumps however these were the wrong size. She states she is getting these replaced and sent in the mail soon. She did receive her juxta lite compression. She had no issues or complaints with the compression wrap this past week. She denies signs of infection. 2/15; patient presents for follow-up. She reports obtaining the correct size lymphedema pumps and started using them yesterday. She has no issues or complaints today. She denies signs of infection. 2/22; patient presents for follow-up. She has no issues or complaints today. 3/1; patient presents for follow-up. She has no issues or complaints today. She brought her juxta light compression with her. Readmission: 09-05-2022 upon evaluation today patient presents for initial evaluation here in our clinic concerning issues that she is having with left medial lower extremity wound. This does appear to be secondary to lymphedema. This has been open for what appears to be roughly 5 or so weeks beginning around the beginning of June 2024. Fortunately there does not appear to be any signs of significant infection overall this is great news. With that being said the patient does seem to have some signs of drainage and discomfort which is not noted at this point. She does have a history of lymphedema, diabetes mellitus type 2, chronic venous hypertension, chronic kidney disease stage III, congestive heart failure, and status post bariatric surgery. Patient History Information obtained from Patient, Chart. Allergies No Known Allergies Galbraith, Casia [name] ([id number]) [id number] Page 7 of 11 Social History Never smoker, Marital Status - Single, Alcohol Use - Rarely, Drug Use - No History, Caffeine Use -  Moderate. Medical History Eyes Patient has history of Cataracts - hx Hematologic/Lymphatic Patient has history of Lymphedema Respiratory Patient has history of Asthma Cardiovascular Patient has history of Congestive Heart Failure, Hypertension Endocrine Patient has history of Type II Diabetes Musculoskeletal Patient has history of Osteoarthritis Neurologic Patient has history of Neuropathy Patient is treated with Insulin, Oral Agents. Blood sugar is tested. Blood sugar results noted at the following times: Breakfast - 132. Review of Systems (ROS) Constitutional Symptoms (General Health) Denies complaints or symptoms of Fatigue, Fever, Chills, Marked Weight Change. Eyes Complains or has symptoms of Dry Eyes. Ear/Nose/Mouth/Throat Denies complaints or symptoms of Difficult clearing ears, Sinusitis. Gastrointestinal Denies complaints or symptoms of  Frequent diarrhea, Nausea, Vomiting. Endocrine Complains or has symptoms of Thyroid disease. Genitourinary CKD stage 3 Immunological Denies complaints or symptoms of Hives, Itching. Integumentary (Skin) Complains or has symptoms of Wounds, Breakdown, Swelling. Psychiatric Denies complaints or symptoms of Anxiety, Claustrophobia. General Notes: Loop recorder 09/28/2021 Objective Constitutional sitting or standing blood pressure is within target range for patient.. pulse regular and within target range for patient.Marland Kitchen respirations regular, non-labored and within target range for patient.Marland Kitchen temperature within target range for patient.. Obese and well-hydrated in no acute distress. Vitals Time Taken: 8:39 AM, Height: 60 in, Source: Stated, Weight: 235 lbs, Source: Stated, BMI: 45.9, Temperature: 97.6 F, Pulse: 62 bpm, Respiratory Rate: 18 breaths/min, Blood Pressure: 139/81 mmHg. Eyes conjunctiva clear no eyelid edema noted. pupils equal round and reactive to light and accommodation. Ears, Nose, Mouth, and Throat no gross abnormality of  ear auricles or external auditory canals. normal hearing noted during conversation. mucus membranes moist. Respiratory normal breathing without difficulty. Cardiovascular 1+ dorsalis pedis/posterior tibialis pulses. Patient does have bilateral nonpitting lymphedema.. Musculoskeletal normal gait and posture. no significant deformity or arthritic changes, no loss or range of motion, no clubbing. Psychiatric this patient is able to make decisions and demonstrates good insight into disease process. Alert and Oriented x 3. pleasant and cooperative. General Notes: Upon inspection patient's wound bed actually showed signs again of having a wound which is a present at this point that does appear to show some signs of necrotic debris this is very minimal however I did sharp debridement clearway necrotic debris which she tolerated well today and postdebridement the wound bed is significantly improved. Integumentary (Hair, Skin) Wound #3 status is Open. Original cause of wound was Gradually Appeared. The date acquired was: 07/30/2022. The wound is located on the Left,Medial Lower Leg. The wound measures 6.5cm length x 2.5cm width x 0.1cm depth; 12.763cm^2 area and 1.276cm^3 volume. There is no tunneling or undermining noted. There is a large amount of serous drainage noted. There is small (1-33%) red, pink granulation within the wound bed. There is a large (67-100%) amount of necrotic tissue within the wound bed including Adherent Slough. ZAHRAA, BHARGAVA ([id number]) [id number] Page 8 of 11 Assessment Active Problems ICD-10 Lymphedema, not elsewhere classified Type 2 diabetes mellitus with other skin ulcer Chronic venous hypertension (idiopathic) with ulcer and inflammation of left lower extremity Non-pressure chronic ulcer of other part of left lower leg with fat layer exposed Chronic kidney disease, stage 3 unspecified Chronic combined systolic (congestive) and  diastolic (congestive) heart failure Bariatric surgery status Procedures Wound #3 Pre-procedure diagnosis of Wound #3 is a Lymphedema located on the Left,Medial Lower Leg . There was a Excisional Skin/Subcutaneous Tissue Debridement with a total area of 12.76 sq cm performed by Allen Derry, PA-C. With the following instrument(s): Curette to remove Viable and Non-Viable tissue/material. Material removed includes Subcutaneous Tissue and Slough and after achieving pain control using Lidocaine 4% T opical Solution. No specimens were taken. A time out was conducted at 09:15, prior to the start of the procedure. A Minimum amount of bleeding was controlled with Pressure. The procedure was tolerated well. Post Debridement Measurements: 6.5cm length x 2.5cm width x 0.1cm depth; 1.276cm^3 volume. Character of Wound/Ulcer Post Debridement is stable. Post procedure Diagnosis Wound #3: Same as Pre-Procedure Pre-procedure diagnosis of Wound #3 is a Lymphedema located on the Left,Medial Lower Leg . There was a Double Layer Compression Therapy Procedure by Angelina Pih, RN. Post procedure Diagnosis Wound #3: Same as Pre-Procedure Plan Follow-up Appointments:  Return Appointment in 1 week. Nurse Visit as needed - new compression wrap placed, NV Friday to check and re-wrap Bathing/ Shower/ Hygiene: May shower; gently cleanse wound with antibacterial soap, rinse and pat dry prior to dressing wounds No tub bath. Anesthetic (Use 'Patient Medications' Section for Anesthetic Order Entry): Lidocaine applied to wound bed Edema Control - Lymphedema / Segmental Compressive Device / Other: UrgoK2 - size large used Elevate, Exercise Daily and Avoid Standing for Long Periods of Time. Elevate leg(s) parallel to the floor when sitting. Compression Pump: Use compression pump on left lower extremity for 60 minutes, twice daily. - you may start using again after your NV to check wrap and re-wrap. DO YOUR BEST to  sleep in the bed at night. DO NOT sleep in your recliner. Long hours of sitting in a recliner leads to swelling of the legs and/or potential wounds on your backside. WOUND #3: - Lower Leg Wound Laterality: Left, Medial Cleanser: Soap and Water 2 x Per Week/30 Days Discharge Instructions: Gently cleanse wound with antibacterial soap, rinse and pat dry prior to dressing wounds Prim Dressing: Silvercel 4 1/4x 4 1/4 (in/in) 2 x Per Week/30 Days ary Discharge Instructions: Apply Silvercel 4 1/4x 4 1/4 (in/in) as instructed Secondary Dressing: Zetuvit Plus 4x8 (in/in) 2 x Per Week/30 Days Com pression Wrap: Urgo K2, two layer compression system, large 2 x Per Week/30 Days 1. Based on what I am seeing currently I do believe that I would recommend that we go ahead and begin for the patient a treatment regimen which includes the use of a Urgo K2 compression wrap. 2. I am also can recommend that we use silver cell as the primary dressing of choice for the area here in question would you Zetuvit over This. 3. I am can have her come in for nurse visit later in the week as I expect this is going to start to slide down. 4. I am also can recommend that the patient should continue to elevate her legs is much as possible to help with edema control. She is previously done well with these compression wraps and I am hopeful this will do well this time for her as well. We will see patient back for reevaluation in 1 week here in the clinic. If anything worsens or changes patient will contact our office for additional recommendations. Electronic Signature(s) Signed: 09/12/2022 1:43:31 PM By: Allen Derry PA-C Entered By: Allen Derry on 09/12/2022 13:43:31 Groleau, Toniann Fail [name] ([id number]) [id number] Page 9 of 11 -------------------------------------------------------------------------------- ROS/PFSH Details Patient Name: Date of Service: [name], [name] 09/05/2022 8:15 A  M Medical Record Number: [id number] Patient Account Number: [id number] Date of Birth/Sex: Treating RN: June 21, 1965 (57 y.o. [name] Primary Care Provider: Mirna Mires Other Clinician: Referring Provider: Treating Provider/Extender: [place] in Treatment: 0 Information Obtained From Patient Chart Constitutional Symptoms (General Health) Complaints and Symptoms: Negative for: Fatigue; Fever; Chills; Marked Weight Change Eyes Complaints and Symptoms: Positive for: Dry Eyes Medical History: Positive for: Cataracts - hx Ear/Nose/Mouth/Throat Complaints and Symptoms: Negative for: Difficult clearing ears; Sinusitis Gastrointestinal Complaints and Symptoms: Negative for: Frequent diarrhea; Nausea; Vomiting Endocrine Complaints and Symptoms: Positive for: Thyroid disease Medical History: Positive for: Type II Diabetes Time with diabetes: 35 years Treated with: Insulin, Oral agents Blood sugar tested every day: Yes Tested : daily Blood sugar testing results: Breakfast: 132 Immunological Complaints and Symptoms: Negative for: Hives; Itching Integumentary (Skin) Complaints and Symptoms: Positive for: Wounds; Breakdown; Swelling Psychiatric Complaints  and Symptoms: Negative for: Anxiety; Claustrophobia Hematologic/Lymphatic Medical HistoryJAHZARIA, VARY ([id number]) [id number] Page 10 of 11 Positive for: Lymphedema Respiratory Medical History: Positive for: Asthma Cardiovascular Medical History: Positive for: Congestive Heart Failure; Hypertension Genitourinary Complaints and Symptoms: Review of System Notes: CKD stage 3 Musculoskeletal Medical History: Positive for: Osteoarthritis Neurologic Medical History: Positive for: Neuropathy Oncologic HBO Extended History Items Eyes: Cataracts Immunizations Pneumococcal Vaccine: Received Pneumococcal Vaccination: Yes Received Pneumococcal  Vaccination On or After 60th Birthday: No Implantable Devices Yes Family and Social History Never smoker; Marital Status - Single; Alcohol Use: Rarely; Drug Use: No History; Caffeine Use: Moderate; Financial Concerns: No; Food, Clothing or Shelter Needs: No; Support System Lacking: No; Transportation Concerns: No Notes Loop recorder 09/28/2021 Electronic Signature(s) Signed: 09/05/2022 3:32:36 PM By: Angelina Pih Signed: 09/05/2022 5:05:34 PM By: Allen Derry PA-C Entered By: Angelina Pih on 09/05/2022 08:45:33 -------------------------------------------------------------------------------- SuperBill Details Patient Name: Date of Service: [name] 09/05/2022 Medical Record Number: [id number] Patient Account Number: [id number] Date of Birth/Sex: Treating RN: 21-Dec-1965 (57 y.o. [name] Primary Care Provider: Mirna Mires Other Clinician: SHERRAL, DIROCCO [name] ([id number]) [id number] Page 11 of 11 Referring Provider: Treating Provider/Extender: [place] in Treatment: 0 Diagnosis Coding ICD-10 Codes Code Description I89.0 Lymphedema, not elsewhere classified E11.622 Type 2 diabetes mellitus with other skin ulcer I87.332 Chronic venous hypertension (idiopathic) with ulcer and inflammation of left lower extremity L97.822 Non-pressure chronic ulcer of other part of left lower leg with fat layer exposed N18.30 Chronic kidney disease, stage 3 unspecified 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.84 Bariatric surgery status Facility Procedures : CPT4 Code: 53664403 Description: 99213 - WOUND CARE VISIT-LEV 3 EST PT Modifier: Quantity: 1 : CPT4 Code: 47425956 Description: 11042 - DEB SUBQ TISSUE 20 SQ CM/< ICD-10 Diagnosis Description L97.822 Non-pressure chronic ulcer of other part of left lower leg with fat layer expos Modifier: ed Quantity: 1 Physician  Procedures : CPT4 Code Description Modifier [id number] 99214 - WC PHYS LEVEL 4 - EST PT 25 ICD-10 Diagnosis Description I89.0 Lymphedema, not elsewhere classified E11.622 Type 2 diabetes mellitus with other skin ulcer I87.332 Chronic venous hypertension (idiopathic)  with ulcer and inflammation of left lower extremity L97.822 Non-pressure chronic ulcer of other part of left lower leg with fat layer exposed Quantity: 1 : [id number] 11042 - WC PHYS SUBQ TISS 20 SQ CM ICD-10 Diagnosis Description L97.822 Non-pressure chronic ulcer of other part of left lower leg with fat layer exposed Quantity: 1 Electronic Signature(s) Signed: 09/06/2022 6:55:52 PM By: Allen Derry PA-C Entered By: Allen Derry on 09/06/2022 18:55:52

## 2022-09-08 DIAGNOSIS — L609 Nail disorder, unspecified: Secondary | ICD-10-CM | POA: Diagnosis not present

## 2022-09-08 DIAGNOSIS — E1140 Type 2 diabetes mellitus with diabetic neuropathy, unspecified: Secondary | ICD-10-CM | POA: Diagnosis not present

## 2022-09-08 DIAGNOSIS — L110 Acquired keratosis follicularis: Secondary | ICD-10-CM | POA: Diagnosis not present

## 2022-09-09 DIAGNOSIS — Z9884 Bariatric surgery status: Secondary | ICD-10-CM | POA: Diagnosis not present

## 2022-09-09 DIAGNOSIS — L97822 Non-pressure chronic ulcer of other part of left lower leg with fat layer exposed: Secondary | ICD-10-CM | POA: Diagnosis not present

## 2022-09-09 DIAGNOSIS — E11622 Type 2 diabetes mellitus with other skin ulcer: Secondary | ICD-10-CM | POA: Diagnosis not present

## 2022-09-09 DIAGNOSIS — I130 Hypertensive heart and chronic kidney disease with heart failure and stage 1 through stage 4 chronic kidney disease, or unspecified chronic kidney disease: Secondary | ICD-10-CM | POA: Diagnosis not present

## 2022-09-09 DIAGNOSIS — I5042 Chronic combined systolic (congestive) and diastolic (congestive) heart failure: Secondary | ICD-10-CM | POA: Diagnosis not present

## 2022-09-09 DIAGNOSIS — Z794 Long term (current) use of insulin: Secondary | ICD-10-CM | POA: Diagnosis not present

## 2022-09-09 DIAGNOSIS — I87332 Chronic venous hypertension (idiopathic) with ulcer and inflammation of left lower extremity: Secondary | ICD-10-CM | POA: Diagnosis not present

## 2022-09-09 DIAGNOSIS — I890 Lymphedema, not elsewhere classified: Secondary | ICD-10-CM | POA: Diagnosis not present

## 2022-09-09 DIAGNOSIS — N183 Chronic kidney disease, stage 3 unspecified: Secondary | ICD-10-CM | POA: Diagnosis not present

## 2022-09-12 ENCOUNTER — Encounter: Payer: 59 | Attending: Family Medicine | Admitting: Dietician

## 2022-09-12 ENCOUNTER — Encounter: Payer: 59 | Admitting: Physician Assistant

## 2022-09-12 DIAGNOSIS — I130 Hypertensive heart and chronic kidney disease with heart failure and stage 1 through stage 4 chronic kidney disease, or unspecified chronic kidney disease: Secondary | ICD-10-CM | POA: Diagnosis not present

## 2022-09-12 DIAGNOSIS — I5042 Chronic combined systolic (congestive) and diastolic (congestive) heart failure: Secondary | ICD-10-CM | POA: Diagnosis not present

## 2022-09-12 DIAGNOSIS — Z9884 Bariatric surgery status: Secondary | ICD-10-CM | POA: Diagnosis not present

## 2022-09-12 DIAGNOSIS — N1832 Chronic kidney disease, stage 3b: Secondary | ICD-10-CM | POA: Insufficient documentation

## 2022-09-12 DIAGNOSIS — I890 Lymphedema, not elsewhere classified: Secondary | ICD-10-CM | POA: Diagnosis not present

## 2022-09-12 DIAGNOSIS — E11622 Type 2 diabetes mellitus with other skin ulcer: Secondary | ICD-10-CM | POA: Diagnosis not present

## 2022-09-12 DIAGNOSIS — I87332 Chronic venous hypertension (idiopathic) with ulcer and inflammation of left lower extremity: Secondary | ICD-10-CM | POA: Diagnosis not present

## 2022-09-12 DIAGNOSIS — E119 Type 2 diabetes mellitus without complications: Secondary | ICD-10-CM | POA: Diagnosis not present

## 2022-09-12 DIAGNOSIS — N183 Chronic kidney disease, stage 3 unspecified: Secondary | ICD-10-CM | POA: Diagnosis not present

## 2022-09-12 DIAGNOSIS — L97822 Non-pressure chronic ulcer of other part of left lower leg with fat layer exposed: Secondary | ICD-10-CM | POA: Diagnosis not present

## 2022-09-12 DIAGNOSIS — Z794 Long term (current) use of insulin: Secondary | ICD-10-CM | POA: Diagnosis not present

## 2022-09-12 DIAGNOSIS — E108 Type 1 diabetes mellitus with unspecified complications: Secondary | ICD-10-CM | POA: Diagnosis not present

## 2022-09-12 NOTE — Progress Notes (Signed)
MICHELLE, WNEK ([id number]) [id number] Page 1 of 2 Visit Report for 09/09/2022 Physician Orders Details Patient Name: Date of Service: [name], [name] 09/09/2022 1:00 PM Medical Record Number: [id number] Patient Account Number: [id number] Date of Birth/Sex: Treating RN: Jul 25, 1965 (57 y.o. [name] Primary Care Provider: Mirna Mires Other Clinician: Betha Loa Referring Provider: Treating Provider/Extender: [place] in Treatment: 0 Verbal / Phone Orders: No Diagnosis Coding Follow-up Appointments Return Appointment in 1 week. Nurse Visit as needed - new compression wrap placed, NV Friday to check and re-wrap Bathing/ Shower/ Hygiene May shower; gently cleanse wound with antibacterial soap, rinse and pat dry prior to dressing wounds No tub bath. Anesthetic (Use 'Patient Medications' Section for Anesthetic Order Entry) Lidocaine applied to wound bed Edema Control - Lymphedema / Segmental Compressive Device / Other UrgoK2 - size large used Elevate, Exercise Daily and A void Standing for Long Periods of Time. Elevate leg(s) parallel to the floor when sitting. Compression Pump: Use compression pump on left lower extremity for 60 minutes, twice daily. - you may start using again after your NV to check wrap and re-wrap. DO YOUR BEST to sleep in the bed at night. DO NOT sleep in your recliner. Long hours of sitting in a recliner leads to swelling of the legs and/or potential wounds on your backside. Wound Treatment Wound #3 - Lower Leg Wound Laterality: Left, Medial Cleanser: Soap and Water 2 x Per Week/30 Days Discharge Instructions: Gently cleanse wound with antibacterial soap, rinse and pat dry prior to dressing wounds Prim Dressing: Silvercel 4 1/4x 4 1/4 (in/in) 2 x Per Week/30 Days ary Discharge Instructions: Apply Silvercel 4 1/4x 4 1/4 (in/in) as instructed Secondary Dressing: Zetuvit Plus 4x8  (in/in) 2 x Per Week/30 Days Compression Wrap: Urgo K2, two layer compression system, large 2 x Per Week/30 Days Electronic Signature(s) Signed: 09/09/2022 2:27:58 PM By: Allen Derry PA-C Signed: 09/12/2022 4:35:54 PM By: Betha Loa Entered By: Betha Loa on 09/09/2022 13:01:15 [name] ([id number]) [id number] Page 2 of 2 -------------------------------------------------------------------------------- SuperBill Details Patient Name: Date of Service: [name] 09/09/2022 Medical Record Number: [id number] Patient Account Number: [id number] Date of Birth/Sex: Treating RN: 21-Nov-1965 (57 y.o. 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

## 2022-09-12 NOTE — Progress Notes (Addendum)
ANNORAH, BRENCHLEY [name] ([id number]) [id number] Page 1 of 8 Visit Report for 09/12/2022 Arrival Information Details Patient Name: Date of Service: [name], [name] 09/12/2022 2:00 PM Medical Record Number: [id number] Patient Account Number: [id number] Date of Birth/Sex: Treating RN: February 22, 1966 (57 y.o. Skip Mayer Primary Care Teaghan Melrose: Mirna Mires Other Clinician: Referring Verland Sprinkle: Treating Davidjames Blansett/Extender: [place] in Treatment: 1 Visit Information History Since Last Visit Added or deleted any medications: No Patient Arrived: Ambulatory Has Dressing in Place as Prescribed: Yes Arrival Time: 14:01 Has Compression in Place as Prescribed: Yes Accompanied By: self Pain Present Now: No Transfer Assistance: None Patient Identification Verified: Yes Secondary Verification Process Completed: Yes Patient Has Alerts: Yes Patient Alerts: type 2 diabetic LLL ABI [place] Electronic Signature(s) Signed: 09/20/2022 8:06:11 AM By: Elliot Gurney, BSN, RN, CWS, Kim RN, BSN Entered By: Elliot Gurney, BSN, RN, CWS, Kim on 09/12/2022 14:04:14 -------------------------------------------------------------------------------- Clinic Level of Care Assessment Details Patient Name: Date of Service: Lori Schneiders [name]. 09/12/2022 2:00 PM Medical Record Number: [id number] Patient Account Number: [id number] Date of Birth/Sex: Treating RN: Mar 24, 1965 (57 y.o. Skip Mayer Primary Care Markis Langland: Mirna Mires Other Clinician: Referring Ziza Hastings: Treating Tonnette Zwiebel/Extender: [place] in Treatment: 1 Clinic Level of Care Assessment Items TOOL 4 Quantity Score []  - 0 Use when only an EandM is performed on FOLLOW-UP visit ASSESSMENTS - Nursing Assessment / Reassessment X- 1 10 Reassessment of Co-morbidities (includes updates in patient status) X- 1 5 Reassessment of Adherence to Treatment Plan ASSESSMENTS - Wound and Skin A ssessment /  Reassessment X - Simple Wound Assessment / Reassessment - one wound 1 5 [name], [name] ([id number]) [id number] Page 2 of 8 []  - 0 Complex Wound Assessment / Reassessment - multiple wounds []  - 0 Dermatologic / Skin Assessment (not related to wound area) ASSESSMENTS - Focused Assessment X- 1 5 Circumferential Edema Measurements - multi extremities []  - 0 Nutritional Assessment / Counseling / Intervention []  - 0 Lower Extremity Assessment (monofilament, tuning fork, pulses) []  - 0 Peripheral Arterial Disease Assessment (using hand held doppler) ASSESSMENTS - Ostomy and/or Continence Assessment and Care []  - 0 Incontinence Assessment and Management []  - 0 Ostomy Care Assessment and Management (repouching, etc.) PROCESS - Coordination of Care X - Simple Patient / Family Education for ongoing care 1 15 []  - 0 Complex (extensive) Patient / Family Education for ongoing care []  - 0 Staff obtains Chiropractor, Records, T Results / Process Orders est []  - 0 Staff telephones HHA, Nursing Homes / Clarify orders / etc []  - 0 Routine Transfer to another Facility (non-emergent condition) []  - 0 Routine Hospital Admission (non-emergent condition) []  - 0 New Admissions / Manufacturing engineer / Ordering NPWT Apligraf, etc. , []  - 0 Emergency Hospital Admission (emergent condition) X- 1 10 Simple Discharge Coordination []  - 0 Complex (extensive) Discharge Coordination PROCESS - Special Needs []  - 0 Pediatric / Minor Patient Management []  - 0 Isolation Patient Management []  - 0 Hearing / Language / Visual special needs []  - 0 Assessment of Community assistance (transportation, D/C planning, etc.) []  - 0 Additional assistance / Altered mentation []  - 0 Support Surface(s) Assessment (bed, cushion, seat, etc.) INTERVENTIONS - Wound Cleansing / Measurement X - Simple Wound Cleansing - one wound 1 5 []  - 0 Complex Wound Cleansing - multiple  wounds X- 1 5 Wound Imaging (photographs - any number of wounds) []  - 0 Wound Tracing (instead of photographs) X- 1 5 Simple Wound Measurement - one wound []  -  0 Complex Wound Measurement - multiple wounds INTERVENTIONS - Wound Dressings X - Small Wound Dressing one or multiple wounds 1 10 []  - 0 Medium Wound Dressing one or multiple wounds []  - 0 Large Wound Dressing one or multiple wounds []  - 0 Application of Medications - topical []  - 0 Application of Medications - injection INTERVENTIONS - Miscellaneous []  - 0 External ear exam []  - 0 Specimen Collection (cultures, biopsies, blood, body fluids, etc.) []  - 0 Specimen(s) / Culture(s) sent or taken to Lab for analysis [name], [name] ([id number]) [id number] Page 3 of 8 []  - 0 Patient Transfer (multiple staff / Nurse, adult / Similar devices) []  - 0 Simple Staple / Suture removal (25 or less) []  - 0 Complex Staple / Suture removal (26 or more) []  - 0 Hypo / Hyperglycemic Management (close monitor of Blood Glucose) []  - 0 Ankle / Brachial Index (ABI) - do not check if billed separately X- 1 5 Vital Signs Has the patient been seen at the hospital within the last three years: Yes Total Score: 80 Level Of Care: New/Established - Level 3 Electronic Signature(s) Signed: 09/20/2022 8:06:11 AM By: Elliot Gurney, BSN, RN, CWS, Kim RN, BSN Entered By: Elliot Gurney, BSN, RN, CWS, Kim on 09/12/2022 14:38:58 -------------------------------------------------------------------------------- Encounter Discharge Information Details Patient Name: Date of Service: Lori Schneiders [name]. 09/12/2022 2:00 PM Medical Record Number: [id number] Patient Account Number: [id number] Date of Birth/Sex: Treating RN: 1965-11-16 (57 y.o. Skip Mayer Primary Care Zelma Mazariego: Mirna Mires Other Clinician: Referring Dyllon Henken: Treating Stace Peace/Extender: [place] in Treatment: 1 Encounter Discharge Information  Items Discharge Condition: Stable Ambulatory Status: Ambulatory Discharge Destination: Home Transportation: Private Auto Accompanied By: self Schedule Follow-up Appointment: No Clinical Summary of Care: Electronic Signature(s) Signed: 09/20/2022 8:06:11 AM By: Elliot Gurney, BSN, RN, CWS, Kim RN, BSN Entered By: Elliot Gurney, BSN, RN, CWS, Kim on 09/12/2022 14:40:42 -------------------------------------------------------------------------------- Lower Extremity Assessment Details Patient Name: Date of Service: JAIDA, BERRIER 09/12/2022 2:00 PM Medical Record Number: [id number] Patient Account Number: [id number] Date of Birth/Sex: Treating RN: Jun 14, 1965 (57 y.o. Skip Mayer Primary Care Mariella Blackwelder: Mirna Mires Other Clinician: CHALON, YAZDANI [name] ([id number]) [id number] Page 4 of 8 Referring Ryann Pauli: Treating Adeli Frost/Extender: Randalyn Rhea Weeks in Treatment: 1 Edema Assessment Assessed: [Left: Yes] [Right: No] Edema: [Left: Ye] [Right: s] Calf Left: Right: Point of Measurement: 32 cm From Medial Instep 54.5 cm Ankle Left: Right: Point of Measurement: 12 cm From Medial Instep 27.8 cm Vascular Assessment Pulses: Dorsalis Pedis Palpable: [Left:Yes] Notes Patient's wrap slid down calf making patient swell between wrap and knee. Electronic Signature(s) Signed: 09/20/2022 8:06:11 AM By: Elliot Gurney, BSN, RN, CWS, Kim RN, BSN Entered By: Elliot Gurney, BSN, RN, CWS, Kim on 09/12/2022 14:14:41 -------------------------------------------------------------------------------- Multi Wound Chart Details Patient Name: Date of Service: CHRISTE, BOTT 09/12/2022 2:00 PM Medical Record Number: [id number] Patient Account Number: [id number] Date of Birth/Sex: Treating RN: 1965-05-18 (57 y.o. Skip Mayer Primary Care Frederika Hukill: Mirna Mires Other Clinician: Referring Vashawn Ekstein: Treating Ceaira Ernster/Extender: [place] in Treatment: 1 Vital  Signs Height(in): 60 Pulse(bpm): 66 Weight(lbs): 235 Blood Pressure(mmHg): 144/67 Body Mass Index(BMI): 45.9 Temperature(F): 97.6 Respiratory Rate(breaths/min): 18 [3:Photos:] [N/A:N/A] Left, Medial Lower Leg N/A N/A Wound Location: Gradually Appeared N/A N/A Wounding Event: Lymphedema N/A N/A Primary EtiologyVONCILLE, [name] ([id number]) [id number] Page 5 of 8 Cataracts, Lymphedema, Asthma, N/A N/A Comorbid History: Congestive Heart Failure, Hypertension, Type II Diabetes, Osteoarthritis, Neuropathy 07/30/2022 N/A N/A Date Acquired: 1 N/A N/A Weeks of Treatment: Healed -  Epithelialized N/A N/A Wound Status: No N/A N/A Wound Recurrence: Yes N/A N/A Clustered Wound: 4 N/A N/A Clustered Quantity: 0x0x0 N/A N/A Measurements L x W x D (cm) 0 N/A N/A A (cm) : rea 0 N/A N/A Volume (cm) : 100.00% N/A N/A % Reduction in A rea: 100.00% N/A N/A % Reduction in Volume: Partial Thickness N/A N/A Classification: Large N/A N/A Exudate A mount: Serous N/A N/A Exudate Type: amber N/A N/A Exudate Color: None Present (0%) N/A N/A Granulation A mount: None Present (0%) N/A N/A Necrotic A mount: Large (67-100%) N/A N/A Epithelialization: Treatment Notes Electronic Signature(s) Signed: 09/20/2022 8:06:11 AM By: Elliot Gurney, BSN, RN, CWS, Kim RN, BSN Entered By: Elliot Gurney, BSN, RN, CWS, Kim on 09/12/2022 14:29:02 -------------------------------------------------------------------------------- Multi-Disciplinary Care Plan Details Patient Name: Date of Service: Lori Schneiders [name]. 09/12/2022 2:00 PM Medical Record Number: [id number] Patient Account Number: [id number] Date of Birth/Sex: Treating RN: [birth year] (57 y.o. Skip Mayer Primary Care Tinaya Ceballos: Mirna Mires Other Clinician: Referring Onisha Cedeno: Treating Camellia Popescu/Extender: [place] in Treatment: 1 Active Inactive Electronic Signature(s) Signed: 09/20/2022 8:06:11 AM  By: Elliot Gurney, BSN, RN, CWS, Kim RN, BSN Entered By: Elliot Gurney, BSN, RN, CWS, Kim on 09/12/2022 14:39:29 Pain Assessment Details -------------------------------------------------------------------------------- [name] ([id number]) [id number] Page 6 of 8 Patient Name: Date of Service: [name], [name] 09/12/2022 2:00 PM Medical Record Number: [id number] Patient Account Number: [id number] Date of Birth/Sex: Treating RN: 07/09/1965 (57 y.o. Skip Mayer Primary Care Lamoyne Palencia: Mirna Mires Other Clinician: Referring Irving Bloor: Treating Janiene Aarons/Extender: [place] in Treatment: 1 Active Problems Location of Pain Severity and Description of Pain Patient Has Paino No Site Locations Pain Management and Medication Current Pain Management: Notes tingling pain per pt Electronic Signature(s) Signed: 09/20/2022 8:06:11 AM By: Elliot Gurney, BSN, RN, CWS, Kim RN, BSN Entered By: Elliot Gurney, BSN, RN, CWS, Kim on 09/12/2022 14:10:37 -------------------------------------------------------------------------------- Patient/Caregiver Education Details Patient Name: Date of Service: [name], [name] 7/15/2024andnbsp2:00 PM Medical Record Number: [id number] Patient Account Number: [id number] Date of Birth/Gender: Treating RN: 1965/06/16 (57 y.o. Skip Mayer Primary Care Physician: Mirna Mires Other Clinician: Referring Physician: Treating Physician/Extender: [place] in Treatment: 1 Education Assessment Education Provided To: Patient Education Topics Provided Venous: Handouts: Controlling Swelling with Compression Stockings Methods: Demonstration, Explain/Verbal Responses: State content correctly [name], [name] ([id number]) [phone].pdf Page 7 of 8 Electronic Signature(s) Signed: 09/20/2022 8:06:11 AM By: Elliot Gurney, BSN, RN, CWS, Kim RN, BSN Entered By: Elliot Gurney, BSN, RN, CWS, Kim on 09/12/2022  14:39:44 -------------------------------------------------------------------------------- Wound Assessment Details Patient Name: Date of Service: [name], [name] 09/12/2022 2:00 PM Medical Record Number: [id number] Patient Account Number: [id number] Date of Birth/Sex: Treating RN: 29-Aug-1965 (57 y.o. Cathlean Cower, Kim Primary Care Kallee Nam: Mirna Mires Other Clinician: Referring Adaria Hole: Treating Rayvin Abid/Extender: Randalyn Rhea Weeks in Treatment: 1 Wound Status Wound Number: 3 Primary Lymphedema Etiology: Wound Location: Left, Medial Lower Leg Wound Healed - Epithelialized Wounding Event: Gradually Appeared Status: Date Acquired: 07/30/2022 Comorbid Cataracts, Lymphedema, Asthma, Congestive Heart Failure, Weeks Of Treatment: 1 History: Hypertension, Type II Diabetes, Osteoarthritis, Neuropathy Clustered Wound: Yes Photos Wound Measurements Length: (cm) Width: (cm) Depth: (cm) Clustered Quantity: Area: (cm) Volume: (cm) 0 % Reduction in Area: 100% 0 % Reduction in Volume: 100% 0 Epithelialization: Large (67-100%) 4 Tunneling: No 0 Undermining: No 0 Wound Description Classification: Partial Thickness Exudate Amount: Large Exudate Type: Serous Exudate Color: amber Wound Bed Granulation Amount: None Present (0%) Necrotic Amount: None Present (0%) Treatment Notes Wound #3 (Lower Leg)  Wound Laterality: Left, Medial [name], [name] ([id number]) [id number] Page 8 of 8 Cleanser Peri-Wound Care Topical Primary Dressing Secondary Dressing Secured With Compression Wrap Compression Stockings Add-Ons Electronic Signature(s) Signed: 09/20/2022 8:06:11 AM By: Elliot Gurney, BSN, RN, CWS, Kim RN, BSN Entered By: Elliot Gurney, BSN, RN, CWS, Kim on 09/12/2022 14:12:45 -------------------------------------------------------------------------------- Vitals Details Patient Name: Date of Service: Lori Schneiders [name]. 09/12/2022 2:00 PM Medical  Record Number: [id number] Patient Account Number: [id number] Date of Birth/Sex: Treating RN: 08/11/1965 (57 y.o. Skip Mayer Primary Care Kynzleigh Bandel: Mirna Mires Other Clinician: Referring Letcher Schweikert: Treating Tabbatha Bordelon/Extender: [place] in Treatment: 1 Vital Signs Time Taken: 14:04 Temperature (F): 97.6 Height (in): 60 Pulse (bpm): 66 Weight (lbs): 235 Respiratory Rate (breaths/min): 18 Body Mass Index (BMI): 45.9 Blood Pressure (mmHg): 144/67 Reference Range: 80 - 120 mg / dl Electronic Signature(s) Signed: 09/20/2022 8:06:11 AM By: Elliot Gurney, BSN, RN, CWS, Kim RN, BSN Entered By: Elliot Gurney, BSN, RN, CWS, Kim on 09/12/2022 14:10:19

## 2022-09-12 NOTE — Progress Notes (Signed)
Medical Nutrition Therapy  Visit:  0820-0850 Unable to use the body composition scale today due to her foot wrap due to wound. Losing weight on Ozempic  Primary Concerns Today: Bariatric Surgery Nutrition Follow Up   Bariatric Surgery Type: RYGB     Surgery Date: 05/01/2018   NUTRITION ASSESSMENT  CGM Results from download: 06/09/2022 09/12/2022  % Time CGM active:    %   (Goal >70%)   Average glucose:   102 mg/dL for 14 days 409 x 14 days  Glucose management indicator:    % 6%  Time in range (70-180 mg/dL):   93 %   (Goal >81%) 92  Time High (181-250 mg/dL):   2 %   (Goal < 19%) 5  Time Very High (>250 mg/dL):    0 %   (Goal < 5%) 1  Time Low (54-69 mg/dL):   4 %   (Goal <1%) 1  Time Very Low (<54 mg/dL):   1 %   (Goal <4%) 1   She is on a [employer]tr[employer] insulin pump - does not carbohydrate count.  Uses a set insulin dose for each meal.  CGM:  Dexcom G7  She would like to learn what she can eat She is on Ozempic and does not have an appetite. Cooks then doesn't feel like eating  Anthropometrics  364 lbs highest adult weight Start weight at [facility]: 351.1 lbs  238 lbs 09/12/2022.  Unable to use the body composition scale as her foot is wrapped due to a wound.  Body Composition Scale 09/22/2020 10/26/2021 08/04/2022  Weight 265.5 258.6 250.3  BMI 51 49.2 48.8  Total Body Fat % 49.6 22 22      Visceral Fat 23 50.7 51.1  Fat-Free Mass % 50.3 39.8 40     Total Body Water % 39.6 27.8 27.4     Muscle-Mass lbs 28 49.6 48.7  Body Fat Displacement            Torso  lbs 81.7 78.9 75.8         Left Leg  lbs 16.3 15.7 15.1         Right Leg  lbs 16.3 15.7 15.1         Left Arm  lbs 8.1 7.8 7.5         Right Arm   lbs 8.1 7.8 7.5   Medical dx:  CKD stage 3, DM type 2, CHF, HTN, HLD Labs: BUN 27, creatinine 1.96, potassium 4.3, phosphorous 4 (decreased from 4.9 - high), eGFR 30, vitamin D 32.6  on 07/07/2022, protein in urine Vitamin B-12 371, thiamine 75.5 on 07/23/2021, no current  A1C Medications: Humalog, ozempic, farxiga  24-Hr Dietary Recall First Meal: apple cinnamon oatmeal (1 pack instant), sausage or boiled egg or skips if she is not hungry Snack:  grapes or toast with jelly Second Meal: small portion leftover [place] food  Snack: small piece cake Third Meal: air fried chicken wings (2), mac and cheese, corn Snack: unsalted saltines with PB  Beverages:  water, coffee with cream and splenda, diet cranberry juice, sugar free lemonade, zero soda (coke/pepsi)   Estimated Daily Fluid Intake: 66+ ounces  Estimated Daily Protein Intake:  40 g Supplements: bariatric MVI, calcium, vitamin D and gummy fiber supplement Current average weekly physical activity: walking 2000 steps    Signs/Symptoms  Using straws: no Drinking while eating: no Chewing/swallowing difficulties: no Changes in vision: no Changes to mood/headaches: no Hair loss/changes to skin/nails: no Difficulty focusing/concentrating: no  Sweating: no Dizziness/lightheadedness: no Palpitations: no Carbonated/caffeinated beverages: yes N/V/D/C/Gas: yes after box of Reece's pieces Abdominal pain: no Dumping syndrome: no   NUTRITION DIAGNOSIS  Overweight/obesity ([place]-3.3) related to past poor dietary habits and physical inactivity as evidenced by patient w/ completed RYGB surgery following dietary guidelines for continued weight loss and healthy nutrition status within the context of CKD and DM   NUTRITION INTERVENTION: continued Nutrition counseling (C-1) and education (E-2) to facilitate bariatric surgery goals, including: The importance of consuming adequate calories as well as certain nutrients daily due to the body's need for essential vitamins, minerals, and fats The importance of daily physical activity and to reach a goal of at least 150 minutes of moderate to vigorous physical activity weekly (or as directed by their physician) due to benefits such as increased musculature and improved lab  values Encouraged patient to honor their body's internal hunger and fullness cues.  Throughout the day, check in mentally and rate hunger. Stop eating when satisfied not full regardless of how much food is left on the plate.  Get more if still hungry 20-30 minutes later.  The key is to honor satisfaction so throughout the meal, rate fullness factor and stop when comfortably satisfied not physically full. The key is to honor hunger and fullness without any feelings of guilt or shame.  Pay attention to what the internal cues are, rather than any external factors. This will enhance the confidence you have in listening to your own body and following those internal cues enabling you to increase how often you eat when you are hungry not out of appetite and stop when you are satisfied not full.  Encouraged pt to continue to eat balanced meals inclusive of non starchy vegetables 2 times a day 7 days a week Encouraged pt to choose lean protein sources: limiting beef, pork, sausage, hotdogs, and lunch meat Encourage pt to choose healthy fats such as plant based limiting animal fats Encouraged pt to continue to drink a minium 64 fluid ounces with half being plain water to satisfy proper hydration  Educated on hypoglycemia correction  Hyperphosphoremia and its consequences  Today discussed contacting her provider if she continues to have lows to get her insulin pump settings changed. Discussed use of Dexcom G7 cell phone app and ability to change with next sensor change reviewed renal guidelines - low sodium, low phos  Handouts Previously Provided Include  Meal ideas with appropriate portion size Mindful meals check list Eat this not that phosphorus list of foods   Readiness for Change: contemplating  Demonstrated degree of understanding via: Teach Back     MONITORING & EVALUATION Dietary intake, weekly physical activity, and body weight follow up   Follow up in 2 months with Jon Gills (bariatric) and Vernona Rieger  in 3 months for diabetes/pump/cgm

## 2022-09-12 NOTE — Progress Notes (Signed)
[name] ([id number]) [id number] Page 1 of 4 Visit Report for 09/09/2022 Arrival Information Details Patient Name: Date of Service: Lori, [name] 09/09/2022 1:00 PM Medical Record Number: [id number] Patient Account Number: [id number] Date of Birth/Sex: Treating RN: 1965-05-31 (57 y.o. [name] Primary Care Joelle Flessner: Mirna Mires Other Clinician: Betha Loa Referring Yakub Lodes: Treating [name]/Extender: [place] in Treatment: 0 Visit Information History Since Last Visit All ordered tests and consults were completed: No Patient Arrived: Ambulatory Added or deleted any medications: No Arrival Time: 12:59 Any new allergies or adverse reactions: No Transfer Assistance: None Had a fall or experienced change in No Patient Identification Verified: Yes activities of daily living that may affect Secondary Verification Process Completed: Yes risk of falls: Patient Has Alerts: Yes Signs or symptoms of abuse/neglect since last visito No Patient Alerts: type 2 diabetic Hospitalized since last visit: No [facility] ABI [place] Implantable device outside of the clinic excluding No cellular tissue based products placed in the center since last visit: Has Dressing in Place as Prescribed: Yes Has Compression in Place as Prescribed: Yes Pain Present Now: No Electronic Signature(s) Signed: 09/12/2022 4:35:54 PM By: Betha Loa Entered By: Betha Loa on 09/09/2022 12:59:21 -------------------------------------------------------------------------------- Clinic Level of Care Assessment Details Patient Name: Date of Service: [name] 09/09/2022 1:00 PM Medical Record Number: [id number] Patient Account Number: [id number] Date of Birth/Sex: Treating RN: 02-25-1966 (58 y.o. [name] Primary Care Deshay Blumenfeld: Mirna Mires Other Clinician: Betha Loa Referring Sneijder Bernards: Treating Relena Ivancic/Extender:  [place] in Treatment: 0 Clinic Level of Care Assessment Items TOOL 1 Quantity Score []  - 0 Use when EandM and Procedure is performed on INITIAL visit ASSESSMENTS - Nursing Assessment / Reassessment []  - 0 General Physical Exam (combine w/ comprehensive assessment (listed just below) when performed on new pt. [name] ([id number]) [id number] Page 2 of 4 []  - 0 Comprehensive Assessment (HX, ROS, Risk Assessments, Wounds Hx, etc.) ASSESSMENTS - Wound and Skin Assessment / Reassessment []  - 0 Dermatologic / Skin Assessment (not related to wound area) ASSESSMENTS - Ostomy and/or Continence Assessment and Care []  - 0 Incontinence Assessment and Management []  - 0 Ostomy Care Assessment and Management (repouching, etc.) PROCESS - Coordination of Care []  - 0 Simple Patient / Family Education for ongoing care []  - 0 Complex (extensive) Patient / Family Education for ongoing care []  - 0 Staff obtains Chiropractor, Records, T Results / Process Orders est []  - 0 Staff telephones HHA, Nursing Homes / Clarify orders / etc []  - 0 Routine Transfer to another Facility (non-emergent condition) []  - 0 Routine Hospital Admission (non-emergent condition) []  - 0 New Admissions / Manufacturing engineer / Ordering NPWT Apligraf, etc. , []  - 0 Emergency Hospital Admission (emergent condition) PROCESS - Special Needs []  - 0 Pediatric / Minor Patient Management []  - 0 Isolation Patient Management []  - 0 Hearing / Language / Visual special needs []  - 0 Assessment of Community assistance (transportation, D/C planning, etc.) []  - 0 Additional assistance / Altered mentation []  - 0 Support Surface(s) Assessment (bed, cushion, seat, etc.) INTERVENTIONS - Miscellaneous []  - 0 External ear exam []  - 0 Patient Transfer (multiple staff / Nurse, adult / Similar devices) []  - 0 Simple Staple / Suture removal (25 or less) []  -  0 Complex Staple / Suture removal (26 or more) []  - 0 Hypo/Hyperglycemic Management (do not check if billed separately) []  - 0 Ankle / Brachial Index (ABI) - do not  check if billed separately Has the patient been seen at the hospital within the last three years: Yes Total Score: 0 Level Of Care: ____ Electronic Signature(s) Signed: 09/12/2022 4:35:54 PM By: Betha Loa Entered By: Betha Loa on 09/09/2022 13:01:41 -------------------------------------------------------------------------------- Compression Therapy Details Patient Name: Date of Service: Lori, [name] 09/09/2022 1:00 PM Medical Record Number: [id number] Patient Account Number: [id number] Date of Birth/Sex: Treating RN: [birth year] (57 y.o. [name] Primary Care Arlen Dupuis: Mirna Mires Other Clinician: Betha Loa [place] [name] ([id number]) [phone].pdf Page 3 of 4 Referring Breylan Lefevers: Treating Alizon Schmeling/Extender: [place] in Treatment: 0 Compression Therapy Performed for Wound Assessment: Wound #3 Left,Medial Lower Leg Performed By: Farrel Gordon, Angie, Compression Type: Double Layer Pre Treatment ABI: 0 Notes LLE noncompressible, patient tolerates wrap well Electronic Signature(s) Signed: 09/12/2022 4:35:54 PM By: Betha Loa Entered By: Betha Loa on 09/09/2022 13:00:51 -------------------------------------------------------------------------------- Encounter Discharge Information Details Patient Name: Date of Service: [name]. 09/09/2022 1:00 PM Medical Record Number: [id number] Patient Account Number: [id number] Date of Birth/Sex: Treating RN: 01-Nov-1965 (57 y.o. [name] Primary Care Virgie Chery: Mirna Mires Other Clinician: Betha Loa Referring Ailis Rigaud: Treating Marvella Jenning/Extender: [place] in Treatment: 0 Encounter Discharge Information Items Discharge Condition:  Stable Ambulatory Status: Ambulatory Discharge Destination: Home Transportation: Private Auto Accompanied By: self Schedule Follow-up Appointment: Yes Clinical Summary of Care: Electronic Signature(s) Signed: 09/12/2022 4:35:54 PM By: Betha Loa Entered By: Betha Loa on 09/09/2022 13:01:36 -------------------------------------------------------------------------------- Wound Assessment Details Patient Name: Date of Service: Lori, [name] 09/09/2022 1:00 PM Medical Record Number: [id number] Patient Account Number: [id number] Date of Birth/Sex: Treating RN: 1965/10/10 (57 y.o. [name] Primary Care Teng Decou: Mirna Mires Other Clinician: Betha Loa Referring Kaydenn Mclear: Treating Adryan Shin/Extender: Randalyn Rhea Weeks in Treatment: 0 Wound Status Lori, ARUTYUNYAN [name] ([id number]) [id number] Page 4 of 4 Wound Number: 3 Primary Lymphedema Etiology: Wound Location: Left, Medial Lower Leg Wound Open Wounding Event: Gradually Appeared Status: Date Acquired: 07/30/2022 Comorbid Cataracts, Lymphedema, Asthma, Congestive Heart Failure, Weeks Of Treatment: 0 History: Hypertension, Type II Diabetes, Osteoarthritis, Neuropathy Clustered Wound: Yes Wound Measurements Length: (cm) 6.5 Width: (cm) 2.5 Depth: (cm) 0.1 Clustered Quantity: 4 Area: (cm) 12.763 Volume: (cm) 1.276 % Reduction in Area: 0% % Reduction in Volume: 0% Epithelialization: None Wound Description Classification: Partial Thickness Exudate Amount: Large Exudate Type: Serous Exudate Color: amber Wound Bed Granulation Amount: Small (1-33%) Granulation Quality: Red, Pink Necrotic Amount: Large (67-100%) Necrotic Quality: Adherent Scientist, physiological) Signed: 09/09/2022 1:24:08 PM By: Angelina Pih Signed: 09/12/2022 4:35:54 PM By: Betha Loa Entered By: Betha Loa on 09/09/2022 12:59:45

## 2022-09-12 NOTE — Progress Notes (Addendum)
RHEN, MARCK ([id number]) [id number] Page 1 of 7 Visit Report for 09/12/2022 Chief Complaint Document Details Patient Name: Date of Service: [name], [name] 09/12/2022 2:00 PM Medical Record Number: [id number] Patient Account Number: [id number] Date of Birth/Sex: Treating RN: 1965/08/01 (57 y.o. [name] Primary Care Provider: Mirna Mires Other Clinician: Referring Provider: Treating Provider/Extender: [place] in Treatment: 1 Information Obtained from: Patient Chief Complaint Left LE Ulcer Electronic Signature(s) Signed: 09/12/2022 1:19:29 PM By: Allen Derry PA-C Entered By: Allen Derry on 09/12/2022 13:19:29 -------------------------------------------------------------------------------- HPI Details Patient Name: Date of Service: Lori Schneiders R. 09/12/2022 2:00 PM Medical Record Number: [id number] Patient Account Number: [id number] Date of Birth/Sex: Treating RN: [birth year] (57 y.o. [name] Primary Care Provider: Mirna Mires Other Clinician: Referring Provider: Treating Provider/Extender: [place] in Treatment: 1 History of Present Illness HPI Description: 10/20/2020 upon evaluation today patient appears to be doing somewhat poorly in regard to her left leg where she has a significant wound although not very deep but this is definitely due to her chronic swelling. Fortunately there does not appear to be any signs of active infection at this time which is great news. There was a lot of scabbing going on and then she had areas that were somewhat open up underneath. It does appear to be very lymphedema like in nature. With that being said I do not see any signs of active infection currently. I do think that based on what we are seeing at the moment this is likely an area that will respond well to appropriate compression therapy. If it does not then we will need to reevaluate  the situation for sure. The patient does have a history of diabetes mellitus type 2, chronic venous insufficiency, lymphedema, chronic kidney disease stage III, and congestive heart failure. She is status post bariatric surgery as well. Overall I am pleased with where things stand I think she does need to definitely have compression in place. This wound has been present for about 4 to 6 weeks she tells me. 10/29/2020 upon evaluation today patient actually appears to be doing much better. She has been tolerating the dressing changes without complication and the compression wrap did slide down her a bit but to be perfectly honest I am very pleased overall with well the wound appears today. I do not see any signs of active infection which is also great news. Nonetheless I think that we are definitely headed in the appropriate direction as far as healing is concerned and in fact this is significantly improved even as compared to just last week. 11/06/2020 upon evaluation today patient appears to be doing well with regard to her wounds. In fact everything appears to be completely healed which is great news. I am extremely pleased with where things stand today. No fevers, chills, nausea, vomiting, or diarrhea. [name], [name] ([id number]) [id number] Page 2 of 7 02/17/2021 [name] is a 57 year old female with a past medical history of insulin-dependent type 2 diabetes, CKD stage III, bariatric surgery and lymphedema that presents to the clinic because she would like to order lymphedema pumps. She was seen in our clinic in August and September for an open wound to her left lower extremity due to lymphedema. She was treated With 4-layer compression for 2-1/2 weeks with resolution of her wound. She has not developed wounds since her discharge in September. She has compression stockings but states she cannot put these on daily. Readmission 03/31/2021 Patient presents  with an open wound today. She was last seen on 02/17/2021 for lymphedema pump order. She states that she is working with insurance to get these covered. She still has not received them. She also has not been using compression stockings daily. She states these are difficult to put on. She developed a wound 2 weeks ago and has been keeping the area covered. She currently denies signs of infection. 2/8; patient presents for follow-up. She reports receiving lymphedema pumps however these were the wrong size. She states she is getting these replaced and sent in the mail soon. She did receive her juxta lite compression. She had no issues or complaints with the compression wrap this past week. She denies signs of infection. 2/15; patient presents for follow-up. She reports obtaining the correct size lymphedema pumps and started using them yesterday. She has no issues or complaints today. She denies signs of infection. 2/22; patient presents for follow-up. She has no issues or complaints today. 3/1; patient presents for follow-up. She has no issues or complaints today. She brought her juxta light compression with her. Readmission: 09-05-2022 upon evaluation today patient presents for initial evaluation here in our clinic concerning issues that she is having with left medial lower extremity wound. This does appear to be secondary to lymphedema. This has been open for what appears to be roughly 5 or so weeks beginning around the beginning of June 2024. Fortunately there does not appear to be any signs of significant infection overall this is great news. With that being said the patient does seem to have some signs of drainage and discomfort which is not noted at this point. She does have a history of lymphedema, diabetes mellitus type 2, chronic venous hypertension, chronic kidney disease stage III, congestive heart failure, and status post bariatric surgery. 09-12-2022 upon evaluation today patient appears to  be doing well currently which is good news. Fortunately I do not see any signs of active infection locally nor systemically which is excellent as well. The good news here is that the patient does seem to be making good progress. In fact her leg ulcer actually appears to be completely healed at this point. Electronic Signature(s) Signed: 09/12/2022 2:36:17 PM By: Allen Derry PA-C Entered By: Allen Derry on 09/12/2022 14:36:17 -------------------------------------------------------------------------------- Physical Exam Details Patient Name: Date of Service: [name], [name] 09/12/2022 2:00 PM Medical Record Number: [id number] Patient Account Number: [id number] Date of Birth/Sex: Treating RN: 12-06-1965 (57 y.o. [name] Primary Care Provider: Mirna Mires Other Clinician: Referring Provider: Treating Provider/Extender: Randalyn Rhea Weeks in Treatment: 1 Constitutional Well-nourished and well-hydrated in no acute distress. Respiratory normal breathing without difficulty. Psychiatric this patient is able to make decisions and demonstrates good insight into disease process. Alert and Oriented x 3. pleasant and cooperative. Notes Upon inspection patient's wound bed actually showed signs of good granulation epithelization at this point. Fortunately I do not see any evidence of active infection or worsening overall and in general I think that she is completely healed I do believe that she is going to continue to elevate her legs much as possible to help with edema control. Electronic Signature(s) Signed: 09/12/2022 2:36:41 PM By: Vella Redhead, [facility] R ([id number]) [id number] Page 3 of 7 Entered By: Allen Derry on 09/12/2022 14:36:41 -------------------------------------------------------------------------------- Physician Orders Details Patient Name: Date of Service: [name], [name] 09/12/2022 2:00 PM Medical  Record Number: [id number] Patient Account Number: [id number] Date of Birth/Sex: Treating RN: 1965/08/17 (57 y.o. Skip Mayer Primary Care  Provider: Mirna Mires Other Clinician: Referring Provider: Treating Provider/Extender: [place] in Treatment: 1 Verbal / Phone Orders: No Diagnosis Coding ICD-10 Coding Code Description I89.0 Lymphedema, not elsewhere classified E11.622 Type 2 diabetes mellitus with other skin ulcer I87.332 Chronic venous hypertension (idiopathic) with ulcer and inflammation of left lower extremity L97.822 Non-pressure chronic ulcer of other part of left lower leg with fat layer exposed N18.30 Chronic kidney disease, stage 3 unspecified 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.84 Bariatric surgery status Discharge From [facility] Services Wear compression garments daily. Put garments on first thing when you wake up and remove them before bed. - Use Tubi-grip E under wrap. Moisturize legs daily after removing compression garments. - Use AandD Ointment on wounded area. Edema Control - Lymphedema / Segmental Compressive Device / Other Compression Pump: Use compression pump on left lower extremity for 60 minutes, twice daily. - You may wear your velcro wraps under the pump sleeves. Compression Pump: Use compression pump on right lower extremity for 60 minutes, twice daily. - You may wear your velcro wraps under the pump sleeves. Additional Orders / Instructions Other: Electronic Signature(s) Signed: 09/13/2022 5:59:11 PM By: Allen Derry PA-C Signed: 09/20/2022 8:06:11 AM By: Elliot Gurney, BSN, RN, CWS, Kim RN, BSN Entered By: Elliot Gurney, BSN, RN, CWS, Kim on 09/12/2022 14:34:12 -------------------------------------------------------------------------------- Problem List Details Patient Name: Date of Service: Lori Dubin. 09/12/2022 2:00 PM Medical Record Number: [id number] Patient Account Number:  [id number] Date of Birth/Sex: Treating RN: 1965-11-22 (57 y.o. 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 09/12/2022 2:00 PM Medical Record Number: [id number] Patient Account Number: [id number] Date of Birth/Sex: Treating RN: 01-Jul-1965 (57 y.o. [name] Primary Care Provider: Mirna Mires Other Clinician: Referring Provider: Treating Provider/Extender: [place] in Treatment: 1 Subjective Chief Complaint Information obtained from Patient [name], [name] ([id number]) [id number] Page 5 of 7 Left LE Ulcer History of Present Illness (HPI) 10/20/2020 upon evaluation today patient appears to be doing somewhat poorly in regard to her left leg where she has a significant  wound although not very deep but this is definitely due to her chronic swelling. Fortunately there does not appear to be any signs of active infection at this time which is great news. There was a lot of scabbing going on and then she had areas that were somewhat open up underneath. It does appear to be very lymphedema like in nature. With that being said I do not see any signs of active infection currently. I do think that based on what we are seeing at the moment this is likely an  area that will respond well to appropriate compression therapy. If it does not then we will need to reevaluate the situation for sure. The patient does have a history of diabetes mellitus type 2, chronic venous insufficiency, lymphedema, chronic kidney disease stage III, and congestive heart failure. She is status post bariatric surgery as well. Overall I am pleased with where things stand I think she does need to definitely have compression in place. This wound has been present for about 4 to 6 weeks she tells me. 10/29/2020 upon evaluation today patient actually appears to be doing much better. She has been tolerating the dressing changes without complication and the compression wrap did slide down her a bit but to be perfectly honest I am very pleased overall with well the wound appears today. I do not see any signs of active infection which is also great news. Nonetheless I think that we are definitely headed in the appropriate direction as far as healing is concerned and in fact this is significantly improved even as compared to just last week. 11/06/2020 upon evaluation today patient appears to be doing well with regard to her wounds. In fact everything appears to be completely healed which is great news. I am extremely pleased with where things stand today. No fevers, chills, nausea, vomiting, or diarrhea. 02/17/2021 [name] is a 57 year old female with a past medical history of insulin-dependent type 2  diabetes, CKD stage III, bariatric surgery and lymphedema that presents to the clinic because she would like to order lymphedema pumps. She was seen in our clinic in August and September for an open wound to her left lower extremity due to lymphedema. She was treated With 4-layer compression for 2-1/2 weeks with resolution of her wound. She has not developed wounds since her discharge in September. She has compression stockings but states she cannot put these on daily. Readmission 03/31/2021 Patient presents with an open wound today. She was last seen on 02/17/2021 for lymphedema pump order. She states that she is working with insurance to get these covered. She still has not received them. She also has not been using compression stockings daily. She states these are difficult to put on. She developed a wound 2 weeks ago and has been keeping the area covered. She currently denies signs of infection. 2/8; patient presents for follow-up. She reports receiving lymphedema pumps however these were the wrong size. She states she is getting these replaced and sent in the mail soon. She did receive her juxta lite compression. She had no issues or complaints with the compression wrap this past week. She denies signs of infection. 2/15; patient presents for follow-up. She reports obtaining the correct size lymphedema pumps and started using them yesterday. She has no issues or complaints today. She denies signs of infection. 2/22; patient presents for follow-up. She has no issues or complaints today. 3/1; patient presents for follow-up. She has no issues or complaints today. She brought her juxta light compression with her. Readmission: 09-05-2022 upon evaluation today patient presents for initial evaluation here in our clinic concerning issues that she is having with left medial lower extremity wound. This does appear to be secondary to lymphedema. This has been open for what appears to be roughly 5 or so weeks  beginning around the beginning of June 2024. Fortunately there does not appear to be any signs of significant infection overall this is great news. With that being said the patient does seem to have some signs of drainage and discomfort  which is not noted at this point. She does have a history of lymphedema, diabetes mellitus type 2, chronic venous hypertension, chronic kidney disease stage III, congestive heart failure, and status post bariatric surgery. 09-12-2022 upon evaluation today patient appears to be doing well currently which is good news. Fortunately I do not see any signs of active infection locally nor systemically which is excellent as well. The good news here is that the patient does seem to be making good progress. In fact her leg ulcer actually appears to be completely healed at this point. Objective Constitutional Well-nourished and well-hydrated in no acute distress. Vitals Time Taken: 2:04 PM, Height: 60 in, Weight: 235 lbs, BMI: 45.9, Temperature: 97.6 F, Pulse: 66 bpm, Respiratory Rate: 18 breaths/min, Blood Pressure: 144/67 mmHg. Respiratory normal breathing without difficulty. Psychiatric this patient is able to make decisions and demonstrates good insight into disease process. Alert and Oriented x 3. pleasant and cooperative. General Notes: Upon inspection patient's wound bed actually showed signs of good granulation epithelization at this point. Fortunately I do not see any evidence of active infection or worsening overall and in general I think that she is completely healed I do believe that she is going to continue to elevate her legs much as possible to help with edema control. Integumentary (Hair, Skin) Wound #3 status is Healed - Epithelialized. Original cause of wound was Gradually Appeared. The date acquired was: 07/30/2022. The wound has been in treatment 1 weeks. The wound is located on the Left,Medial Lower Leg. The wound measures 0cm length x 0cm width x 0cm  depth; 0cm^2 area and 0cm^3 volume. There is no tunneling or undermining noted. There is a large amount of serous drainage noted. There is no granulation within the wound bed. There is no necrotic tissue within the wound bed. [name], [name] ([id number]) [id number] Page 6 of 7 Assessment Active Problems ICD-10 Lymphedema, not elsewhere classified Type 2 diabetes mellitus with other skin ulcer Chronic venous hypertension (idiopathic) with ulcer and inflammation of left lower extremity Non-pressure chronic ulcer of other part of left lower leg with fat layer exposed Chronic kidney disease, stage 3 unspecified Chronic combined systolic (congestive) and diastolic (congestive) heart failure Bariatric surgery status Plan Discharge From [facility] Services: Wear compression garments daily. Put garments on first thing when you wake up and remove them before bed. - Use Tubi-grip E under wrap. Moisturize legs daily after removing compression garments. - Use AandD Ointment on wounded area. Edema Control - Lymphedema / Segmental Compressive Device / Other: Compression Pump: Use compression pump on left lower extremity for 60 minutes, twice daily. - You may wear your velcro wraps under the pump sleeves. Compression Pump: Use compression pump on right lower extremity for 60 minutes, twice daily. - You may wear your velcro wraps under the pump sleeves. Additional Orders / Instructions: Other: 1. I am going to recommend that she continue to monitor for any signs of infection or worsening. Based on what I am seeing I do believe that we are making good headway towards complete closure. 2. In good recommend as well patient should continue to elevate her leg is much as possible to help with edema control. She is also using her lymphedema pumps. 3. I am also can recommend that we have the patient continue to utilize the Velcro wraps that she has at home I think that this is the best  way to go although we did put Tubigrip size E on her today this is something that is  temporary and the compression socks will be better in combination with her lymphedema wraps. Will see her back for follow-up visit as needed. Electronic Signature(s) Signed: 09/12/2022 2:37:36 PM By: Allen Derry PA-C Entered By: Allen Derry on 09/12/2022 14:37:36 -------------------------------------------------------------------------------- SuperBill Details Patient Name: Date of Service: Lori Dubin 09/12/2022 Medical Record Number: [id number] Patient Account Number: [id number] Date of Birth/Sex: Treating RN: [birth year] (57 y.o. [name] Primary Care Provider: Mirna Mires Other Clinician: Referring Provider: Treating Provider/Extender: [place] in Treatment: 1 Diagnosis Coding ICD-10 Codes Code Description I89.0 Lymphedema, not elsewhere classified E11.622 Type 2 diabetes mellitus with other skin ulcer I87.332 Chronic venous hypertension (idiopathic) with ulcer and inflammation of left lower extremity [name], Lori R ([id number]) [id number] Page 7 of 7 [phone] Non-pressure chronic ulcer of other part of left lower leg with fat layer exposed N18.30 Chronic kidney disease, stage 3 unspecified 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.84 Bariatric surgery status Facility Procedures : CPT4 Code: [id number] Description: 99213 - WOUND CARE VISIT-LEV 3 EST PT Modifier: Quantity: 1 Physician Procedures : CPT4 Code Description Modifier [id number] - WC PHYS LEVEL 3 - EST PT ICD-10 Diagnosis Description I89.0 Lymphedema, not elsewhere classified E11.622 Type 2 diabetes mellitus with other skin ulcer I87.332 Chronic venous hypertension (idiopathic) with  ulcer and inflammation of left lower extremity L97.822 Non-pressure chronic ulcer of other part of left lower leg with fat layer  exposed Quantity: 1 Electronic Signature(s) Signed: 09/13/2022 5:59:11 PM By: Allen Derry PA-C Signed: 09/20/2022 8:06:11 AM By: Elliot Gurney, BSN, RN, CWS, Kim RN, BSN Previous Signature: 09/12/2022 2:38:39 PM Version By: Allen Derry PA-C Entered By: Elliot Gurney, BSN, RN, CWS, Kim on 09/12/2022 14:39:09

## 2022-10-04 DIAGNOSIS — I5032 Chronic diastolic (congestive) heart failure: Secondary | ICD-10-CM | POA: Diagnosis not present

## 2022-10-04 DIAGNOSIS — N189 Chronic kidney disease, unspecified: Secondary | ICD-10-CM | POA: Diagnosis not present

## 2022-10-04 DIAGNOSIS — N2581 Secondary hyperparathyroidism of renal origin: Secondary | ICD-10-CM | POA: Diagnosis not present

## 2022-10-04 DIAGNOSIS — E1122 Type 2 diabetes mellitus with diabetic chronic kidney disease: Secondary | ICD-10-CM | POA: Diagnosis not present

## 2022-10-04 DIAGNOSIS — R809 Proteinuria, unspecified: Secondary | ICD-10-CM | POA: Diagnosis not present

## 2022-10-05 ENCOUNTER — Encounter: Payer: 59 | Attending: Family Medicine | Admitting: Skilled Nursing Facility1

## 2022-10-05 ENCOUNTER — Encounter: Payer: Self-pay | Admitting: Skilled Nursing Facility1

## 2022-10-05 VITALS — Ht 60.5 in | Wt 234.6 lb

## 2022-10-05 DIAGNOSIS — E66813 Obesity, class 3: Secondary | ICD-10-CM

## 2022-10-05 DIAGNOSIS — R809 Proteinuria, unspecified: Secondary | ICD-10-CM | POA: Diagnosis not present

## 2022-10-05 DIAGNOSIS — N1832 Chronic kidney disease, stage 3b: Secondary | ICD-10-CM

## 2022-10-05 DIAGNOSIS — Z6841 Body Mass Index (BMI) 40.0 and over, adult: Secondary | ICD-10-CM | POA: Diagnosis present

## 2022-10-05 DIAGNOSIS — E1122 Type 2 diabetes mellitus with diabetic chronic kidney disease: Secondary | ICD-10-CM | POA: Diagnosis not present

## 2022-10-05 DIAGNOSIS — E119 Type 2 diabetes mellitus without complications: Secondary | ICD-10-CM

## 2022-10-05 DIAGNOSIS — N2581 Secondary hyperparathyroidism of renal origin: Secondary | ICD-10-CM | POA: Diagnosis not present

## 2022-10-05 DIAGNOSIS — I5032 Chronic diastolic (congestive) heart failure: Secondary | ICD-10-CM | POA: Diagnosis not present

## 2022-10-05 NOTE — Progress Notes (Signed)
Medical Nutrition Therapy    Primary Concerns Today: Bariatric Surgery Nutrition Follow Up   Bariatric Surgery Type: RYGB     Surgery Date: 05/01/2018   NUTRITION ASSESSMENT  CGM Results from download: 06/09/2022 09/12/2022 10/05/2022  % Time CGM active:    %   (Goal >70%)    Average glucose:   102 mg/dL for 14 days 308 x 14 days 114 X 14 days  Glucose management indicator:    % 6% 6  Time in range (70-180 mg/dL):   93 %   (Goal >65%) 92 93% in range  Time High (181-250 mg/dL):   2 %   (Goal < 78%) 5 6  Time Very High (>250 mg/dL):    0 %   (Goal < 5%) 1 0  Time Low (54-69 mg/dL):   4 %   (Goal <4%) 1 0  Time Very Low (<54 mg/dL):   1 %   (Goal <6%) 1 0   She is on a Medtr[employer] insulin pump - does not carbohydrate count.  Uses a set insulin dose for each meal.  CGM:  Dexcom G7   Pt states in the last 2 weeks she started eating fruit loops as snacks: dietitian advised to do cheerios instead Pt states she has gotten into eating pork rinds: Dietitian advised she only have a small handful ONE time a day and no more.  Pt states she stopped doing a pack of crackers unless her blood sugar drops.  Pt state she loves chinese food: Dietitian advised she do not eat chinese food more that once a month.  Pt states she has been doing so much better with ignoring carvings and telling herself "no you dont need that, walk away" Pt state she has an appt with her kidney doctor tomorrow.  Pt states her family seems to start to accept she eats differently now.   Anthropometrics  364 lbs highest adult weight Start weight at [facility]: 351.1 lbs    Body Composition Scale 09/22/2020 10/26/2021 08/04/2022 10/05/2022  Weight 265.5 258.6 250.3 234.6  BMI 51 49.2 48.8 47.3  Total Body Fat % 49.6 22 22 20      Visceral Fat 23 50.7 51.1 52.6  Fat-Free Mass % 50.3 39.8 40 40.8     Total Body Water % 39.6 27.8 27.4 27.4     Muscle-Mass lbs 28 49.6 48.7 45  Body Fat Displacement             Torso  lbs 81.7 78.9  75.8 68.8         Left Leg  lbs 16.3 15.7 15.1 13.7         Right Leg  lbs 16.3 15.7 15.1 13.7         Left Arm  lbs 8.1 7.8 7.5 6.8         Right Arm   lbs 8.1 7.8 7.5 6.8   Medical dx:  CKD stage 3, DM type 2, CHF, HTN, HLD Labs: BUN 27, creatinine 1.96, potassium 4.3, phosphorous 4 (decreased from 4.9 - high), eGFR 30, vitamin D 32.6  on 07/07/2022, protein in urine Vitamin B-12 371, thiamine 75.5 on 07/23/2021 Medications: Humalog insulin pump, ozempic, farxiga  24-Hr Dietary Recall First Meal: apple cinnamon oatmeal (1 pack instant), sausage or boiled egg or skips if she is not hungry Snack:  grapes or toast with jelly Second Meal: small portion leftover [place] food  Snack: small piece cake or peanut butter crackers Third Meal:  fried gizzards  and fried onion rings Snack:  Beverages:  water, coffee with cream and splenda, diet cranberry juice, sugar free lemonade, zero soda (coke/pepsi)   Estimated Daily Fluid Intake: 66+ ounces  Estimated Daily Protein Intake:  40 g Supplements: bariatric MVI, calcium, vitamin D and gummy fiber supplement Current average weekly physical activity: walking 2000 steps    Signs/Symptoms  Using straws: no Drinking while eating: no Chewing/swallowing difficulties: no Changes in vision: no Changes to mood/headaches: no Hair loss/changes to skin/nails: no Difficulty focusing/concentrating: no Sweating: no Dizziness/lightheadedness: no Palpitations: no Carbonated/caffeinated beverages: yes N/V/D/C/Gas: yes after box of Reece's pieces Abdominal pain: no Dumping syndrome: no   NUTRITION DIAGNOSIS  Overweight/obesity ([place]-3.3) related to past poor dietary habits and physical inactivity as evidenced by patient w/ completed RYGB surgery following dietary guidelines for continued weight loss and healthy nutrition status within the context of CKD and DM   NUTRITION INTERVENTION: continued Nutrition counseling (C-1) and education (E-2) to facilitate  bariatric surgery goals, including: The importance of consuming adequate calories as well as certain nutrients daily due to the body's need for essential vitamins, minerals, and fats The importance of daily physical activity and to reach a goal of at least 150 minutes of moderate to vigorous physical activity weekly (or as directed by their physician) due to benefits such as increased musculature and improved lab values Encouraged patient to honor their body's internal hunger and fullness cues.  Throughout the day, check in mentally and rate hunger. Stop eating when satisfied not full regardless of how much food is left on the plate.  Get more if still hungry 20-30 minutes later.  The key is to honor satisfaction so throughout the meal, rate fullness factor and stop when comfortably satisfied not physically full. The key is to honor hunger and fullness without any feelings of guilt or shame.  Pay attention to what the internal cues are, rather than any external factors. This will enhance the confidence you have in listening to your own body and following those internal cues enabling you to increase how often you eat when you are hungry not out of appetite and stop when you are satisfied not full.  Encouraged pt to continue to eat balanced meals inclusive of non starchy vegetables 2 times a day 7 days a week Encouraged pt to choose lean protein sources: limiting beef, pork, sausage, hotdogs, and lunch meat Encourage pt to choose healthy fats such as plant based limiting animal fats Encouraged pt to continue to drink a minium 64 fluid ounces with half being plain water to satisfy proper hydration  Educated on hypoglycemia correction  Hyperphosphoremia and its consequences  Previous: discussed contacting her provider if she continues to have lows to get her insulin pump settings changed. Previous: Discussed use of Dexcom G7 cell phone app and ability to change with next sensor change Previous: reviewed  renal guidelines - low sodium, low phos NEW: educated pt on needing real juice for a low not diet juice   Handouts Previously Provided Include  Meal ideas with appropriate portion size Mindful meals check list Eat this not that phosphorus list of foods   Readiness for Change: contemplating  Demonstrated degree of understanding via: Teach Back     MONITORING & EVALUATION Dietary intake, weekly physical activity, and body weight follow up with Vernona Rieger before th holiday season

## 2022-10-10 ENCOUNTER — Ambulatory Visit: Payer: 59 | Admitting: Cardiology

## 2022-10-10 ENCOUNTER — Encounter: Payer: Self-pay | Admitting: Cardiology

## 2022-10-10 ENCOUNTER — Other Ambulatory Visit: Payer: Self-pay | Admitting: Cardiology

## 2022-10-10 VITALS — BP 106/66 | HR 65 | Resp 16 | Ht 60.0 in | Wt 240.2 lb

## 2022-10-10 DIAGNOSIS — I5032 Chronic diastolic (congestive) heart failure: Secondary | ICD-10-CM

## 2022-10-10 DIAGNOSIS — Z6841 Body Mass Index (BMI) 40.0 and over, adult: Secondary | ICD-10-CM

## 2022-10-10 DIAGNOSIS — N184 Chronic kidney disease, stage 4 (severe): Secondary | ICD-10-CM

## 2022-10-10 DIAGNOSIS — E1122 Type 2 diabetes mellitus with diabetic chronic kidney disease: Secondary | ICD-10-CM | POA: Diagnosis not present

## 2022-10-10 DIAGNOSIS — Z794 Long term (current) use of insulin: Secondary | ICD-10-CM | POA: Diagnosis not present

## 2022-10-10 MED ORDER — OZEMPIC (2 MG/DOSE) 8 MG/3ML ~~LOC~~ SOPN: 2.0000 mg | PEN_INJECTOR | SUBCUTANEOUS | 1 refills | Status: DC

## 2022-10-10 NOTE — Progress Notes (Signed)
Primary Physician/Referring:  Mirna Mires, MD  Patient ID: [name], female    DOB: 05/04/1965, 57 y.o.   MRN: [id number]  Chief Complaint  Patient presents with   Chronic diastolic CHF (congestive heart failure) (HCC)   Obesity   Follow-up    [name]  is a 57 y.o. 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  On 05/19/2022,, I started her on Ozempic which she is tolerating.  It is helping her to eat less. She has not had any recurrence of chest pain, dyspnea has remained stable.  She has not had any further leg edema.  No PND or orthopnea.     Past Medical History:  Diagnosis Date   Acute asthma exacerbation 12/21/2014   Acute renal failure (HCC)    ARF (acute renal failure) (HCC) 05/03/2014   Asthma    Asthma exacerbation 05/03/2014   Asthma, severe persistent 05/03/2014   Chronic diastolic CHF (congestive heart failure) (HCC) 06/21/2018   Diabetes mellitus    Diabetes mellitus type 2 in obese 05/03/2014   History of echocardiogram 04/2014   LVH, diastolic dysfunction   Hyperlipidemia    Hypertension    Hyponatremia 12/02/2014   Obesity    Preop examination 05/18/2017   SOB (shortness of breath) 12/22/2014   Past Surgical History:  Procedure Laterality Date   CARDIAC CATHETERIZATION  03/2013   normal coronary arteries, EF 55%   CARDIAC CATHETERIZATION  03/2013   normal coronary arteries   CATARACT EXTRACTION W/PHACO Right 12/10/2012   Procedure: CATARACT EXTRACTION PHACO AND INTRAOCULAR LENS PLACEMENT (IOC);  Surgeon: Gemma Payor, MD;  Location: [facility];  Service: Ophthalmology;  Laterality: Right;  CDE:22..42   CATARACT EXTRACTION W/PHACO Left 05/11/2015   Procedure: CATARACT EXTRACTION PHACO AND INTRAOCULAR LENS  PLACEMENT LEFT EYE CDE=6.54;  Surgeon: Gemma Payor, MD;  Location: [facility];  Service: Ophthalmology;  Laterality: Left;   COLONOSCOPY N/A 09/22/2017   Procedure: COLONOSCOPY;  Surgeon: West Bali, MD;  Location: [facility];  Service: Endoscopy;  Laterality: N/A;  12:15   COLONOSCOPY WITH PROPOFOL N/A 09/27/2021   Procedure: COLONOSCOPY WITH PROPOFOL;  Surgeon: Lanelle Bal, DO;  Location: [facility];  Service: Endoscopy;  Laterality: N/A;  8:15am   EYE SURGERY Left    GASTRIC BYPASS  05/01/2018   GASTRIC ROUX-EN-Y N/A 05/01/2018   Procedure: LAPAROSCOPIC ROUX-EN-Y GASTRIC BYPASS WITH UPPER ENDOSCOPY WITH ERAS PATHWAY;  Surgeon: Kinsinger, De Blanch, MD;  Location: [facility];  Service: General;  Laterality: N/A;   LEFT HEART CATHETERIZATION WITH CORONARY ANGIOGRAM N/A 04/09/2013   Procedure: LEFT HEART CATHETERIZATION WITH CORONARY ANGIOGRAM;  Surgeon: Pamella Pert, MD;  Location[facility];  Service: Cardiovascular;  Laterality: N/A;   Loop recorder implantation     Insertion of [employer] Water quality scientist. Serial # [id number] 09/29/21 for heart failure research   TRACHEOSTOMY     at age 68 from asthma attack   Family History  Problem Relation Age of Onset   Diabetes Mother    Asthma Mother    Bronchitis Mother    Colon polyps Father    Hypertension Sister    Asthma Other    Hypertension Other    Colon cancer Neg Hx    Social History   Tobacco Use  Smoking status: Never   Smokeless tobacco: Never  Substance Use Topics   Alcohol use: Yes    Comment: occ    ROS   Review of Systems  Constitutional: Positive for weight loss.  Cardiovascular:  Positive for dyspnea on exertion (stable and improved). Negative for chest pain and leg swelling.   Objective:  Blood pressure 106/66, pulse 65, resp. rate 16, height 5' (1.524 m), weight 240 lb 3.2 oz (109 kg), last menstrual period 07/29/2013, SpO2 97%. Body mass index is 46.91 kg/m.     10/10/2022    8:44 AM  10/05/2022    8:31 AM 06/30/2022   10:11 AM  Vitals with BMI  Height 5\' 0"  5' 0.5" 5\' 0"   Weight 240 lbs 3 oz 234 lbs 10 oz 251 lbs 6 oz  BMI 46.91 45.05 49.1  Systolic 106  124  Diastolic 66  77  Pulse 65  56    Physical Exam Vitals reviewed.  Constitutional:      Appearance: She is morbidly obese.     Comments: Morbidly obese  Neck:     Vascular: Carotid bruit (bilateral) present. No JVD.  Cardiovascular:     Rate and Rhythm: Normal rate and regular rhythm.     Pulses: Normal pulses and intact distal pulses.          Dorsalis pedis pulses are 2+ on the right side and 2+ on the left side.       Posterior tibial pulses are 2+ on the right side and 2+ on the left side.     Heart sounds: Murmur heard.     Midsystolic murmur is present with a grade of 2/6 at the upper right sternal border.     No gallop.  Pulmonary:     Effort: Pulmonary effort is normal.     Breath sounds: Normal breath sounds.  Abdominal:     General: Bowel sounds are normal.     Palpations: Abdomen is soft.     Comments: Obese. Pannus present  Musculoskeletal:     Right lower leg: Edema (trace) present.     Left lower leg: Edema (trace) present.    Lab Results  Component Value Date   NA 144 06/28/2022   K 4.2 06/28/2022   CO2 23 06/28/2022   GLUCOSE 69 (L) 06/28/2022   BUN 31 (H) 06/28/2022   CREATININE 1.89 (H) 06/28/2022   CALCIUM 9.4 06/28/2022   EGFR 31 (L) 06/28/2022   GFRNONAA 24 (L) 09/23/2021       Latest Ref Rng & Units 06/28/2022   11:38 AM 09/23/2021    8:45 AM 03/02/2021    2:40 PM  CMP  Glucose 70 - 99 mg/dL 69  93  87   BUN 6 - 24 mg/dL 31  48  51   Creatinine 0.57 - 1.00 mg/dL 2.95  6.21  3.08   Sodium 134 - 144 mmol/L 144  141  144   Potassium 3.5 - 5.2 mmol/L 4.2  4.0  4.1   Chloride 96 - 106 mmol/L 103  108  106   CO2 20 - 29 mmol/L 23  28  24    Calcium 8.7 - 10.2 mg/dL 9.4  8.9  9.2       Latest Ref Rng & Units 10/30/2020    6:27 PM 05/02/2018    5:05 AM 05/01/2018   10:47 AM   CBC  WBC 4.0 - 10.5 K/uL 7.7  15.5    Hemoglobin 12.0 - 15.0  g/dL 16.1  09.6  04.5   Hematocrit 36.0 - 46.0 % 36.0  35.3  35.6   Platelets 150 - 400 K/uL 173  216     ProBNP (last 3 results) Recent Labs    06/28/22 1138  PROBNP 561*   Last vitamin D Lab Results  Component Value Date   VD25OH 35.6 06/28/2022   Lab Results  Component Value Date   CHOL 169 06/28/2022   HDL 67 06/28/2022   LDLCALC 91 06/28/2022   TRIG 57 06/28/2022    External labs:  Labs 07/07/2022:  Hb 11.8/HCT 37.5, platelets 196, normal indicis.  Serum glucose 79 mg, BUN 27, serum creatinine 1.96, EGFR 30 mL, potassium 4.3, sodium 143.  Urinary protein to creatinine ratio 351.  Radiology :  No results found.  Cardiac studies:   Sleep Study 2016: Negative sleep study for apnea.    Coronary angiogram 04/09/2013: Normal coronary arteries, normal left ventricle systolic function. Markedly elevated LVEDP of 31 mmHg.  PCV ECHOCARDIOGRAM COMPLETE 01/19/2021  Narrative Echocardiogram 01/19/2021: Normal LV systolic function with visual EF 55-60%. Left ventricle cavity is normal in size. Normal left ventricular wall thickness. Normal global wall motion. Indeterminate diastolic filling pattern, elevated LAP. Mild tricuspid regurgitation. No evidence of pulmonary hypertension. Compared to study 02/17/2011 no significant change.      Renal artery duplex  08/18/2021: Right: Normal size right kidney. No visualized stenosis in segments        visualized, limited visualization. Left:  Normal size of left kidney. No visualized stenosis in        segments visualized, limited visualization.  EKG:   EKG 05/19/2022: Normal sinus rhythm at the rate of 58 bpm, normal EKG.  Compared to 04/20/2022, no change.  Allergies & Medications  No Known Allergies  Current Outpatient Medications:    acetaminophen (TYLENOL) 500 MG tablet, Take 500 mg by mouth every 6 (six) hours as needed for moderate pain., Disp: , Rfl:     albuterol (PROVENTIL HFA;VENTOLIN HFA) 108 (90 BASE) MCG/ACT inhaler, Inhale 2 puffs into the lungs every 6 (six) hours as needed for wheezing., Disp: , Rfl:    albuterol (PROVENTIL) (2.5 MG/3ML) 0.083% nebulizer solution, Take 3 mLs (2.5 mg total) by nebulization every 6 (six) hours as needed for wheezing or shortness of breath., Disp: 75 mL, Rfl: 12   amLODipine (NORVASC) 5 MG tablet, TAKE ONE TABLET BY MOUTH ONCE DAILY., Disp: 90 tablet, Rfl: 0   atorvastatin (LIPITOR) 40 MG tablet, Take 40 mg by mouth daily., Disp: , Rfl:    calcitRIOL (ROCALTROL) 0.25 MCG capsule, Take 0.25 mcg by mouth daily., Disp: , Rfl:    Calcium Carbonate-Vitamin D (CALCIUM 600+D PO), Take 1 tablet by mouth daily., Disp: , Rfl:    carboxymethylcellulose (REFRESH PLUS) 0.5 % SOLN, Place 1 drop into both eyes 3 (three) times daily as needed (dry eyes)., Disp: , Rfl:    carvedilol (COREG) 6.25 MG tablet, Take 6.25 mg by mouth 2 (two) times daily. , Disp: , Rfl:    Cholecalciferol (VITAMIN D) 2000 units tablet, Take 2,000 Units by mouth daily. , Disp: , Rfl:    FARXIGA 5 MG TABS tablet, Take 5 mg by mouth daily., Disp: , Rfl:    FIBER ADULT GUMMIES PO, Take 3 capsules by mouth daily., Disp: , Rfl:    Fluticasone-Salmeterol (ADVAIR) 100-50 MCG/DOSE AEPB, Inhale 1 puff into the lungs daily as needed (shortness of breath)., Disp: , Rfl:    gabapentin (NEURONTIN) 300 MG  capsule, Take 1 capsule (300 mg total) by mouth 4 (four) times daily. (Patient taking differently: Take 300 mg by mouth 4 (four) times daily as needed (pain).), Disp: 120 capsule, Rfl: 2   HUMALOG 100 UNIT/ML injection, Uses in insulin pump, Disp: , Rfl:    hydrALAZINE (APRESOLINE) 25 MG tablet, Take 1 tablet (25 mg total) by mouth 3 (three) times daily., Disp: 270 tablet, Rfl: 3   Insulin Human (INSULIN PUMP) SOLN, Inject into the skin., Disp: , Rfl:    levothyroxine (SYNTHROID, LEVOTHROID) 75 MCG tablet, Take 75 mcg by mouth daily before breakfast., Disp: , Rfl:     Lifitegrast (XIIDRA) 5 % SOLN, Place 1 drop into both eyes daily as needed (itchy eyes)., Disp: , Rfl:    montelukast (SINGULAIR) 10 MG tablet, Take 10 mg by mouth daily. , Disp: , Rfl:    Multiple Vitamins-Minerals (ADULT GUMMY PO), Take 1 capsule by mouth daily., Disp: , Rfl:    torsemide (DEMADEX) 20 MG tablet, Take 40 mg by mouth daily. Change to daily from BID. 06/30/22, Disp: , Rfl:    Semaglutide, 2 MG/DOSE, (OZEMPIC, 2 MG/DOSE,) 8 MG/3ML SOPN, Inject 2 mg into the skin once a week., Disp: 9 mL, Rfl: 1   Assessment:      ICD-10-CM   1. Chronic diastolic CHF (congestive heart failure) (HCC)  I50.32     2. Type 2 diabetes mellitus with stage 4 chronic kidney disease, with long-term current use of insulin (HCC)  E11.22 Semaglutide, 2 MG/DOSE, (OZEMPIC, 2 MG/DOSE,) 8 MG/3ML SOPN   N18.4    Z79.4     3. Class 3 severe obesity due to excess calories with serious comorbidity and body mass index (BMI) of 40.0 to 44.9 in adult [facility])  E66.01    Z68.41          Meds ordered this encounter  Medications   Semaglutide, 2 MG/DOSE, (OZEMPIC, 2 MG/DOSE,) 8 MG/3ML SOPN    Sig: Inject 2 mg into the skin once a week.    Dispense:  9 mL    Refill:  1    Medications Discontinued During This Encounter  Medication Reason   OZEMPIC, 1 MG/DOSE, 4 MG/3ML SOPN Dose change   Semaglutide, 2 MG/DOSE, (OZEMPIC, 2 MG/DOSE,) 8 MG/3ML SOPN Reorder     Recommendations:    [name]  is a 57 y.o. 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  1. Chronic diastolic CHF (congestive heart failure) (HCC) Patient is presently doing well, has lost about 20 pounds in weight.  No clinical evidence of heart failure, there is no JVD, trace leg edema.  Lungs are  clear.  2. 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  She has lost about 20 pounds in weight.  We again discussed regarding making lifestyle changes, her insulin pump needs to be readjusted for reduced dose as she was having occasional episodes of hypoglycemia.   3. Class 3 severe obesity due to excess calories with serious comorbidity and body mass index (BMI) of 40.0 to 44.9 in adult [facility]) As dictated above, patient has had weight loss of close to 20 pounds.  Her BMI has reduced from greater than 50 to the present 46.9.  Will continue to reinforce calorie restriction and exercise.  I will see her back in 6 months for follow-up.  I reviewed external labs, she has not had any recent hospitalization from heart failure, renal function has remained stable as well.    Yates Decamp, MD, [facility] 10/10/2022, 9:36 AM Office: [phone] Fax: [phone] Pager: [phone]

## 2022-10-18 DIAGNOSIS — E108 Type 1 diabetes mellitus with unspecified complications: Secondary | ICD-10-CM | POA: Diagnosis not present

## 2022-11-07 ENCOUNTER — Other Ambulatory Visit (HOSPITAL_COMMUNITY): Payer: Self-pay | Admitting: Family Medicine

## 2022-11-07 DIAGNOSIS — Z1231 Encounter for screening mammogram for malignant neoplasm of breast: Secondary | ICD-10-CM

## 2022-11-08 DIAGNOSIS — I5032 Chronic diastolic (congestive) heart failure: Secondary | ICD-10-CM | POA: Diagnosis not present

## 2022-11-08 DIAGNOSIS — I10 Essential (primary) hypertension: Secondary | ICD-10-CM | POA: Diagnosis not present

## 2022-11-08 DIAGNOSIS — J45909 Unspecified asthma, uncomplicated: Secondary | ICD-10-CM | POA: Diagnosis not present

## 2022-11-08 DIAGNOSIS — E1122 Type 2 diabetes mellitus with diabetic chronic kidney disease: Secondary | ICD-10-CM | POA: Diagnosis not present

## 2022-11-08 DIAGNOSIS — E7800 Pure hypercholesterolemia, unspecified: Secondary | ICD-10-CM | POA: Diagnosis not present

## 2022-11-08 DIAGNOSIS — E039 Hypothyroidism, unspecified: Secondary | ICD-10-CM | POA: Diagnosis not present

## 2022-11-08 DIAGNOSIS — N184 Chronic kidney disease, stage 4 (severe): Secondary | ICD-10-CM | POA: Diagnosis not present

## 2022-11-09 ENCOUNTER — Ambulatory Visit: Payer: 59

## 2022-11-14 NOTE — Progress Notes (Signed)
Was able to fit patient with shoes and inserts she received several months ago due to edema of ankles shoes were too tight and was hard for patient to don and doff  I thinned out inserts creating more room inside shoes patient was happy with fit will try and call office if any other problems arise  Addison Bailey CPed, CFo, CFm

## 2022-11-15 DIAGNOSIS — E108 Type 1 diabetes mellitus with unspecified complications: Secondary | ICD-10-CM | POA: Diagnosis not present

## 2022-11-21 ENCOUNTER — Encounter: Payer: Self-pay | Admitting: Obstetrics & Gynecology

## 2022-11-21 ENCOUNTER — Other Ambulatory Visit (HOSPITAL_COMMUNITY)
Admission: RE | Admit: 2022-11-21 | Discharge: 2022-11-21 | Disposition: A | Discharge status: Home / Self Care | Payer: 59 | Source: Ambulatory Visit | Attending: Obstetrics & Gynecology | Admitting: Obstetrics & Gynecology

## 2022-11-21 ENCOUNTER — Ambulatory Visit: Payer: 59 | Admitting: Obstetrics & Gynecology

## 2022-11-21 VITALS — BP 137/71 | HR 60 | Ht 60.5 in | Wt 234.0 lb

## 2022-11-21 DIAGNOSIS — Z1151 Encounter for screening for human papillomavirus (HPV): Secondary | ICD-10-CM | POA: Diagnosis not present

## 2022-11-21 DIAGNOSIS — Z01419 Encounter for gynecological examination (general) (routine) without abnormal findings: Secondary | ICD-10-CM | POA: Insufficient documentation

## 2022-11-21 MED ORDER — NYSTATIN 100000 UNIT/GM EX POWD: 1.0000 | Freq: Three times a day (TID) | CUTANEOUS | 11 refills | Status: DC

## 2022-11-21 NOTE — Addendum Note (Signed)
Addended by: Lazaro Arms on: 11/21/2022 11:56 AM   Modules accepted: Orders

## 2022-11-21 NOTE — Progress Notes (Addendum)
Subjective:     [name] is a 57 y.o. female here for a routine exam.  Patient's last menstrual period was 07/29/2013. G0P0000 Birth Control Method:  menopausal Menstrual Calendar(currently): amenorrhea since 2015  Current complaints: none wants STI check.   Current acute medical issues:  none   Recent Gynecologic History Patient's last menstrual period was 07/29/2013. Last Pap: years ago,  normal Last mammogram: 2023,  normal  Past Medical History:  Diagnosis Date   Acute asthma exacerbation 12/21/2014   Acute renal failure (HCC)    ARF (acute renal failure) (HCC) 05/03/2014   Asthma    Asthma exacerbation 05/03/2014   Asthma, severe persistent 05/03/2014   Chronic diastolic CHF (congestive heart failure) (HCC) 06/21/2018   Diabetes mellitus    Diabetes mellitus type 2 in obese 05/03/2014   History of echocardiogram 04/2014   LVH, diastolic dysfunction   Hyperlipidemia    Hypertension    Hyponatremia 12/02/2014   Obesity    Preop examination 05/18/2017   SOB (shortness of breath) 12/22/2014    Past Surgical History:  Procedure Laterality Date   CARDIAC CATHETERIZATION  03/2013   normal coronary arteries, EF 55%   CARDIAC CATHETERIZATION  03/2013   normal coronary arteries   CATARACT EXTRACTION W/PHACO Right 12/10/2012   Procedure: CATARACT EXTRACTION PHACO AND INTRAOCULAR LENS PLACEMENT (IOC);  Surgeon: Gemma Payor, MD;  Location: [facility];  Service: Ophthalmology;  Laterality: Right;  CDE:22..42   CATARACT EXTRACTION W/PHACO Left 05/11/2015   Procedure: CATARACT EXTRACTION PHACO AND INTRAOCULAR LENS PLACEMENT LEFT EYE CDE=6.54;  Surgeon: Gemma Payor, MD;  Location: [facility];  Service: Ophthalmology;  Laterality: Left;   COLONOSCOPY N/A 09/22/2017   Procedure: COLONOSCOPY;  Surgeon: West Bali, MD;  Location: [facility];  Service: Endoscopy;  Laterality: N/A;  12:15   COLONOSCOPY WITH PROPOFOL N/A 09/27/2021   Procedure: COLONOSCOPY WITH PROPOFOL;  Surgeon: Lanelle Bal, DO;  Location: [facility];  Service: Endoscopy;  Laterality: N/A;  8:15am   EYE SURGERY Left    GASTRIC BYPASS  05/01/2018   GASTRIC ROUX-EN-Y N/A 05/01/2018   Procedure: LAPAROSCOPIC ROUX-EN-Y GASTRIC BYPASS WITH UPPER ENDOSCOPY WITH ERAS PATHWAY;  Surgeon: Kinsinger, De Blanch, MD;  Location: [facility];  Service: General;  Laterality: N/A;   LEFT HEART CATHETERIZATION WITH CORONARY ANGIOGRAM N/A 04/09/2013   Procedure: LEFT HEART CATHETERIZATION WITH CORONARY ANGIOGRAM;  Surgeon: Pamella Pert, MD;  Location: [facility] CATH LAB;  Service: Cardiovascular;  Laterality: N/A;   Loop recorder implantation     Insertion of [employer] Water quality scientist. Serial # [id number] 09/29/21 for heart failure research   TRACHEOSTOMY     at age 74 from asthma attack    OB History     Gravida  0   Para  0   Term  0   Preterm  0   AB  0   Living  0      SAB  0   IAB  0   Ectopic  0   Multiple  0   Live Births  0           Social History   Socioeconomic History   Marital status: Single    Spouse name: Not on file   Number of children: 0   Years of education: Not on file   Highest education level: Not on file  Occupational History   Not on file  Tobacco Use   Smoking status: Never   Smokeless  tobacco: Never  Vaping Use   Vaping status: Never Used  Substance and Sexual Activity   Alcohol use: Yes    Comment: occ   Drug use: No   Sexual activity: Yes    Birth control/protection: None  Other Topics Concern   Not on file  Social History Narrative   Not on file   Social Determinants of Health   Financial Resource Strain: Not on file  Food Insecurity: No Food Insecurity (05/04/2017)   Hunger Vital Sign    Worried About Running Out of Food in the Last Year: Never true    Ran Out of Food in the Last Year: Never true  Transportation Needs: Not on file  Physical Activity: Not on file  Stress: Not on file  Social Connections: Not on file    Family History   Problem Relation Age of Onset   Diabetes Mother    Asthma Mother    Bronchitis Mother    Colon polyps Father    Hypertension Sister    Asthma Other    Hypertension Other    Colon cancer Neg Hx      Current Outpatient Medications:    acetaminophen (TYLENOL) 500 MG tablet, Take 500 mg by mouth every 6 (six) hours as needed for moderate pain., Disp: , Rfl:    albuterol (PROVENTIL HFA;VENTOLIN HFA) 108 (90 BASE) MCG/ACT inhaler, Inhale 2 puffs into the lungs every 6 (six) hours as needed for wheezing., Disp: , Rfl:    albuterol (PROVENTIL) (2.5 MG/3ML) 0.083% nebulizer solution, Take 3 mLs (2.5 mg total) by nebulization every 6 (six) hours as needed for wheezing or shortness of breath., Disp: 75 mL, Rfl: 12   amLODipine (NORVASC) 5 MG tablet, TAKE ONE TABLET BY MOUTH ONCE DAILY., Disp: 90 tablet, Rfl: 0   atorvastatin (LIPITOR) 40 MG tablet, Take 40 mg by mouth daily., Disp: , Rfl:    calcitRIOL (ROCALTROL) 0.25 MCG capsule, Take 0.25 mcg by mouth daily., Disp: , Rfl:    Calcium Carbonate-Vitamin D (CALCIUM 600+D PO), Take 1 tablet by mouth daily., Disp: , Rfl:    carboxymethylcellulose (REFRESH PLUS) 0.5 % SOLN, Place 1 drop into both eyes 3 (three) times daily as needed (dry eyes)., Disp: , Rfl:    carvedilol (COREG) 6.25 MG tablet, Take 6.25 mg by mouth 2 (two) times daily. , Disp: , Rfl:    Cholecalciferol (VITAMIN D) 2000 units tablet, Take 2,000 Units by mouth daily. , Disp: , Rfl:    FARXIGA 5 MG TABS tablet, Take 5 mg by mouth daily., Disp: , Rfl:    FIBER ADULT GUMMIES PO, Take 3 capsules by mouth daily., Disp: , Rfl:    Fluticasone-Salmeterol (ADVAIR) 100-50 MCG/DOSE AEPB, Inhale 1 puff into the lungs daily as needed (shortness of breath)., Disp: , Rfl:    gabapentin (NEURONTIN) 300 MG capsule, Take 1 capsule (300 mg total) by mouth 4 (four) times daily. (Patient taking differently: Take 300 mg by mouth 4 (four) times daily as needed (pain).), Disp: 120 capsule, Rfl: 2   HUMALOG  100 UNIT/ML injection, Uses in insulin pump, Disp: , Rfl:    hydrALAZINE (APRESOLINE) 25 MG tablet, Take 1 tablet (25 mg total) by mouth 3 (three) times daily., Disp: 270 tablet, Rfl: 3   Insulin Human (INSULIN PUMP) SOLN, Inject into the skin., Disp: , Rfl:    levothyroxine (SYNTHROID, LEVOTHROID) 75 MCG tablet, Take 75 mcg by mouth daily before breakfast., Disp: , Rfl:    Lifitegrast (XIIDRA) 5 %  SOLN, Place 1 drop into both eyes daily as needed (itchy eyes)., Disp: , Rfl:    montelukast (SINGULAIR) 10 MG tablet, Take 10 mg by mouth daily. , Disp: , Rfl:    Multiple Vitamins-Minerals (ADULT GUMMY PO), Take 1 capsule by mouth daily., Disp: , Rfl:    nystatin (MYCOSTATIN/NYSTOP) powder, Apply 1 Application topically 3 (three) times daily., Disp: 15 g, Rfl: 11   OZEMPIC, 2 MG/DOSE, 8 MG/3ML SOPN, INJECT 2MG  INTO THE SKIN ONCE A WEEK, Disp: 9 mL, Rfl: 0   torsemide (DEMADEX) 20 MG tablet, Take 40 mg by mouth daily. Change to daily from BID. 06/30/22, Disp: , Rfl:   Review of Systems  Review of Systems  Constitutional: Negative for fever, chills, weight loss, malaise/fatigue and diaphoresis.  HENT: Negative for hearing loss, ear pain, nosebleeds, congestion, sore throat, neck pain, tinnitus and ear discharge.   Eyes: Negative for blurred vision, double vision, photophobia, pain, discharge and redness.  Respiratory: Negative for cough, hemoptysis, sputum production, shortness of breath, wheezing and stridor.   Cardiovascular: Negative for chest pain, palpitations, orthopnea, claudication, leg swelling and PND.  Gastrointestinal: negative for abdominal pain. Negative for heartburn, nausea, vomiting, diarrhea, constipation, blood in stool and melena.  Genitourinary: Negative for dysuria, urgency, frequency, hematuria and flank pain.  Musculoskeletal: Negative for myalgias, back pain, joint pain and falls.  Skin: Negative for itching and rash.  Neurological: Negative for dizziness, tingling, tremors,  sensory change, speech change, focal weakness, seizures, loss of consciousness, weakness and headaches.  Endo/Heme/Allergies: Negative for environmental allergies and polydipsia. Does not bruise/bleed easily.  Psychiatric/Behavioral: Negative for depression, suicidal ideas, hallucinations, memory loss and substance abuse. The patient is not nervous/anxious and does not have insomnia.        Objective:  Blood pressure 137/71, pulse 60, height 5' 0.5" (1.537 m), weight 234 lb (106.1 kg), last menstrual period 07/29/2013.   Physical Exam  Vitals reviewed. Constitutional: She is oriented to person, place, and time. She appears well-developed and well-nourished.  HENT:  Head: Normocephalic and atraumatic.        Right Ear: External ear normal.  Left Ear: External ear normal.  Nose: Nose normal.  Mouth/Throat: Oropharynx is clear and moist.  Eyes: Conjunctivae and EOM are normal. Pupils are equal, round, and reactive to light. Right eye exhibits no discharge. Left eye exhibits no discharge. No scleral icterus.  Neck: Normal range of motion. Neck supple. No tracheal deviation present. No thyromegaly present.  Cardiovascular: Normal rate, regular rhythm, normal heart sounds and intact distal pulses.  Exam reveals no gallop and no friction rub.   No murmur heard. Respiratory: Effort normal and breath sounds normal. No respiratory distress. She has no wheezes. She has no rales. She exhibits no tenderness.  GI: Soft. Bowel sounds are normal. She exhibits no distension and no mass. There is no tenderness. There is no rebound and no guarding.  Genitourinary:  Breasts no masses skin changes or nipple changes bilaterally      Vulva is normal without lesions Vagina is pink moist without discharge Cervix normal in appearance and pap is done Uterus is normal size shape and contour Adnexa is negative with normal sized ovaries   Musculoskeletal: Normal range of motion. She exhibits no edema and no  tenderness.  Neurological: She is alert and oriented to person, place, and time. She has normal reflexes. She displays normal reflexes. No cranial nerve deficit. She exhibits normal muscle tone. Coordination normal.  Skin: Skin is warm and dry.  No rash noted. No erythema. No pallor.  Psychiatric: She has a normal mood and affect. Her behavior is normal. Judgment and thought content normal.       Medications Ordered at today's visit: Meds ordered this encounter  Medications   nystatin (MYCOSTATIN/NYSTOP) powder    Sig: Apply 1 Application topically 3 (three) times daily.    Dispense:  15 g    Refill:  11    Other orders placed at today's visit: No orders of the defined types were placed in this encounter.     Assessment:    Normal Gyn exam.    Plan:    Contraception: post menopausal status. Follow up in: 3 years.     Return in about 3 years (around 11/20/2025), or if symptoms worsen or fail to improve, for yearly.

## 2022-11-25 LAB — CYTOLOGY - PAP
Chlamydia: NEGATIVE
Comment: NEGATIVE
Comment: NEGATIVE
Comment: NEGATIVE
Comment: NEGATIVE
Comment: NORMAL
Diagnosis: NEGATIVE
HPV 16: NEGATIVE
HPV 18 / 45: NEGATIVE
High risk HPV: POSITIVE — AB
Neisseria Gonorrhea: NEGATIVE

## 2022-11-28 NOTE — Progress Notes (Signed)
Seen by casting department

## 2022-12-08 DIAGNOSIS — L609 Nail disorder, unspecified: Secondary | ICD-10-CM | POA: Diagnosis not present

## 2022-12-08 DIAGNOSIS — E1140 Type 2 diabetes mellitus with diabetic neuropathy, unspecified: Secondary | ICD-10-CM | POA: Diagnosis not present

## 2022-12-08 DIAGNOSIS — L110 Acquired keratosis follicularis: Secondary | ICD-10-CM | POA: Diagnosis not present

## 2022-12-15 DIAGNOSIS — E108 Type 1 diabetes mellitus with unspecified complications: Secondary | ICD-10-CM | POA: Diagnosis not present

## 2022-12-23 DIAGNOSIS — H04123 Dry eye syndrome of bilateral lacrimal glands: Secondary | ICD-10-CM | POA: Diagnosis not present

## 2022-12-23 DIAGNOSIS — H43821 Vitreomacular adhesion, right eye: Secondary | ICD-10-CM | POA: Diagnosis not present

## 2022-12-23 DIAGNOSIS — E133593 Other specified diabetes mellitus with proliferative diabetic retinopathy without macular edema, bilateral: Secondary | ICD-10-CM | POA: Diagnosis not present

## 2022-12-23 DIAGNOSIS — H35372 Puckering of macula, left eye: Secondary | ICD-10-CM | POA: Diagnosis not present

## 2023-01-11 ENCOUNTER — Ambulatory Visit (HOSPITAL_COMMUNITY)
Admission: RE | Admit: 2023-01-11 | Discharge: 2023-01-11 | Disposition: A | Discharge status: Home / Self Care | Payer: 59 | Source: Ambulatory Visit | Attending: Family Medicine | Admitting: Family Medicine

## 2023-01-11 ENCOUNTER — Ambulatory Visit (HOSPITAL_COMMUNITY): Payer: 59

## 2023-01-11 DIAGNOSIS — M2021 Hallux rigidus, right foot: Secondary | ICD-10-CM | POA: Diagnosis not present

## 2023-01-11 DIAGNOSIS — Z1231 Encounter for screening mammogram for malignant neoplasm of breast: Secondary | ICD-10-CM | POA: Insufficient documentation

## 2023-01-11 DIAGNOSIS — M21611 Bunion of right foot: Secondary | ICD-10-CM | POA: Diagnosis not present

## 2023-01-12 DIAGNOSIS — I5032 Chronic diastolic (congestive) heart failure: Secondary | ICD-10-CM | POA: Diagnosis not present

## 2023-01-12 DIAGNOSIS — N189 Chronic kidney disease, unspecified: Secondary | ICD-10-CM | POA: Diagnosis not present

## 2023-01-12 DIAGNOSIS — R809 Proteinuria, unspecified: Secondary | ICD-10-CM | POA: Diagnosis not present

## 2023-01-12 DIAGNOSIS — E1129 Type 2 diabetes mellitus with other diabetic kidney complication: Secondary | ICD-10-CM | POA: Diagnosis not present

## 2023-01-12 DIAGNOSIS — N2581 Secondary hyperparathyroidism of renal origin: Secondary | ICD-10-CM | POA: Diagnosis not present

## 2023-01-13 DIAGNOSIS — E1122 Type 2 diabetes mellitus with diabetic chronic kidney disease: Secondary | ICD-10-CM | POA: Diagnosis not present

## 2023-01-13 DIAGNOSIS — N2581 Secondary hyperparathyroidism of renal origin: Secondary | ICD-10-CM | POA: Diagnosis not present

## 2023-01-13 DIAGNOSIS — I5032 Chronic diastolic (congestive) heart failure: Secondary | ICD-10-CM | POA: Diagnosis not present

## 2023-01-13 DIAGNOSIS — R809 Proteinuria, unspecified: Secondary | ICD-10-CM | POA: Diagnosis not present

## 2023-01-16 DIAGNOSIS — E108 Type 1 diabetes mellitus with unspecified complications: Secondary | ICD-10-CM | POA: Diagnosis not present

## 2023-01-19 ENCOUNTER — Encounter: Payer: Self-pay | Admitting: Dietician

## 2023-01-19 ENCOUNTER — Encounter: Payer: 59 | Attending: Family Medicine | Admitting: Dietician

## 2023-01-19 DIAGNOSIS — E11649 Type 2 diabetes mellitus with hypoglycemia without coma: Secondary | ICD-10-CM | POA: Insufficient documentation

## 2023-01-19 DIAGNOSIS — N1832 Chronic kidney disease, stage 3b: Secondary | ICD-10-CM | POA: Insufficient documentation

## 2023-01-19 NOTE — Patient Instructions (Signed)
Increase the variety of your meals. Increase vegetables Portion size Baked rather than fried most often. Avoid added salt.  Mindfulness:  Consistently scheduled meal - avoid skipping  Choices  Eat slowly  Away from distraction (sitting in kitchen or dining room)  Stop eating when satisfied  Before a snack ask, "Am I hungry or eating for another reason?"   "What can I do instead if I am not hungry?"  Try to find something every day that brings you joy!

## 2023-01-19 NOTE — Progress Notes (Signed)
Medical Nutrition Therapy  [id number] Patient is here today alone.  She was last seen by an RD in our office on 10/05/2022 Primary Concerns Today: Diabetes follow up  She states that her foot wound is healed.   Problems with leg swelling and wears cuffs to help with this at night. She states that she has been having more low blood glucose about 5 am.  She states that she has had her pump basal rate adjusted.  Discussed for her to continue to follow up with her MD about this. She is preparing for thanksgiving.  Encouraged her to avoid added salt and plan for other meals as well to get more balance. Has mostly stopped dark soda. Recent visit with her nephrologist who stated her kidney's were stable.  Unable to see these labs.  Bariatric Surgery Type: RYGB     Surgery Date: 05/01/2018   NUTRITION ASSESSMENT  CGM Results from download: 06/09/2022 09/12/2022 01/19/2023  % Time CGM active:    %   (Goal >70%)    Average glucose:   102 mg/dL for 14 days 540 x 14 days 104X14  Glucose management indicator:    % 6%   Time in range (70-180 mg/dL):   93 %   (Goal >98%) 92 88  Time High (181-250 mg/dL):   2 %   (Goal < 11%) 5 3  Time Very High (>250 mg/dL):    0 %   (Goal < 5%) 1 <1  Time Low (54-69 mg/dL):   4 %   (Goal <9%) 1 7  Time Very Low (<54 mg/dL):   1 %   (Goal <1%) 1 1   She is on a Medtronic insulin pump - does not carbohydrate count.  Uses a set insulin dose for each meal.  CGM:  Dexcom G7  Anthropometrics  364 lbs highest adult weight Start weight at [facility]: 351.1 lbs  232 lbs 01/19/2023   238 lbs 09/12/2022  Body Composition Scale 09/22/2020 10/26/2021 08/04/2022 10/05/2022  Weight 265.5 258.6 250.3 234.6  BMI 51 49.2 48.8 47.3  Total Body Fat % 49.6 22 22 20      Visceral Fat 23 50.7 51.1 52.6  Fat-Free Mass % 50.3 39.8 40 40.8     Total Body Water % 39.6 27.8 27.4 27.4     Muscle-Mass lbs 28 49.6 48.7 45  Body Fat Displacement             Torso  lbs 81.7 78.9 75.8 68.8          Left Leg  lbs 16.3 15.7 15.1 13.7         Right Leg  lbs 16.3 15.7 15.1 13.7         Left Arm  lbs 8.1 7.8 7.5 6.8         Right Arm   lbs 8.1 7.8 7.5 6.8   Medical dx:  CKD stage 3, DM type 2, CHF, HTN, HLD Labs: BUN 31, creatinine 1.89, potassium 4.2, eGFR 06/28/2022 vitamin D 32.6  on 07/07/2022, phos 4 on  07/07/22 which is decreased from 4.9, protein in urine Vitamin B-12 371, thiamine 75.5 on 07/23/2021 Medications: Humalog insulin pump, ozempic, farxiga  24-Hr Dietary Recall First Meal: instant grits, coffee with cream and splenda Snack:   Second Meal: ham sandwich on [employer] bread, mayo, grapes Snack: grapes Third Meal: banana, mayo sandwich on [employer] bread Snack: cheese puffs Beverages:  water, coffee with cream and splenda, diet cranberry juice, sugar free lemonade, zero  soda (gingerale)   Estimated Daily Fluid Intake: 66+ ounces  Estimated Daily Protein Intake:  40 g Supplements: bariatric MVI, calcium, vitamin D and gummy fiber supplement Current average weekly physical activity: walking 2000 steps    Signs/Symptoms  Using straws: no Drinking while eating: no Chewing/swallowing difficulties: no Changes in vision: no Changes to mood/headaches: no Hair loss/changes to skin/nails: no Difficulty focusing/concentrating: no Sweating: no Dizziness/lightheadedness: no Palpitations: no Carbonated/caffeinated beverages: yes N/V/D/C/Gas: yes after box of Reece's pieces Abdominal pain: no Dumping syndrome: no   NUTRITION DIAGNOSIS  Overweight/obesity ([place]-3.3) related to past poor dietary habits and physical inactivity as evidenced by patient w/ completed RYGB surgery following dietary guidelines for continued weight loss and healthy nutrition status within the context of CKD and DM   NUTRITION INTERVENTION: continued Nutrition counseling (C-1) and education (E-2) to facilitate bariatric surgery goals, including: The importance of consuming adequate calories as well as certain nutrients  daily due to the body's need for essential vitamins, minerals, and fats The importance of daily physical activity and to reach a goal of at least 150 minutes of moderate to vigorous physical activity weekly (or as directed by their physician) due to benefits such as increased musculature and improved lab values Encouraged patient to honor their body's internal hunger and fullness cues.  Throughout the day, check in mentally and rate hunger. Stop eating when satisfied not full regardless of how much food is left on the plate.  Get more if still hungry 20-30 minutes later.  The key is to honor satisfaction so throughout the meal, rate fullness factor and stop when comfortably satisfied not physically full. The key is to honor hunger and fullness without any feelings of guilt or shame.  Pay attention to what the internal cues are, rather than any external factors. This will enhance the confidence you have in listening to your own body and following those internal cues enabling you to increase how often you eat when you are hungry not out of appetite and stop when you are satisfied not full.  Encouraged pt to continue to eat balanced meals inclusive of non starchy vegetables 2 times a day 7 days a week Encouraged pt to choose lean protein sources: limiting beef, pork, sausage, hotdogs, and lunch meat Encourage pt to choose healthy fats such as plant based limiting animal fats Encouraged pt to continue to drink a minium 64 fluid ounces with half being plain water to satisfy proper hydration  Educated on hypoglycemia correction  Hyperphosphoremia and its consequences  Previous: discussed contacting her provider if she continues to have lows to get her insulin pump settings changed. Previous: Discussed use of Dexcom G7 cell phone app and ability to change with next sensor change Previous: reviewed renal guidelines - low sodium, low phos NEW: educated pt on needing real juice for a low not diet juice    Handouts Previously Provided Include  Meal ideas with appropriate portion size Mindful meals check list Eat this not that phosphorus list of foods   Readiness for Change: contemplating  Demonstrated degree of understanding via: Teach Back     MONITORING & EVALUATION Dietary intake, weekly physical activity, and body weight follow up with Jon Gills in 2 months.

## 2023-02-07 DIAGNOSIS — I10 Essential (primary) hypertension: Secondary | ICD-10-CM | POA: Diagnosis not present

## 2023-02-07 DIAGNOSIS — E1122 Type 2 diabetes mellitus with diabetic chronic kidney disease: Secondary | ICD-10-CM | POA: Diagnosis not present

## 2023-02-07 DIAGNOSIS — I5032 Chronic diastolic (congestive) heart failure: Secondary | ICD-10-CM | POA: Diagnosis not present

## 2023-02-07 DIAGNOSIS — E7800 Pure hypercholesterolemia, unspecified: Secondary | ICD-10-CM | POA: Diagnosis not present

## 2023-02-07 DIAGNOSIS — E039 Hypothyroidism, unspecified: Secondary | ICD-10-CM | POA: Diagnosis not present

## 2023-03-08 ENCOUNTER — Other Ambulatory Visit: Payer: Self-pay | Admitting: Cardiology

## 2023-03-08 DIAGNOSIS — N184 Chronic kidney disease, stage 4 (severe): Secondary | ICD-10-CM

## 2023-03-15 DIAGNOSIS — Z794 Long term (current) use of insulin: Secondary | ICD-10-CM | POA: Diagnosis not present

## 2023-03-15 DIAGNOSIS — E108 Type 1 diabetes mellitus with unspecified complications: Secondary | ICD-10-CM | POA: Diagnosis not present

## 2023-03-15 DIAGNOSIS — E1122 Type 2 diabetes mellitus with diabetic chronic kidney disease: Secondary | ICD-10-CM | POA: Diagnosis not present

## 2023-03-23 ENCOUNTER — Ambulatory Visit: Payer: 59 | Admitting: Skilled Nursing Facility1

## 2023-03-24 DIAGNOSIS — M2021 Hallux rigidus, right foot: Secondary | ICD-10-CM | POA: Diagnosis not present

## 2023-03-24 DIAGNOSIS — M21611 Bunion of right foot: Secondary | ICD-10-CM | POA: Diagnosis not present

## 2023-03-29 ENCOUNTER — Encounter: Payer: Self-pay | Admitting: Skilled Nursing Facility1

## 2023-03-29 ENCOUNTER — Encounter: Payer: 59 | Attending: Family Medicine | Admitting: Skilled Nursing Facility1

## 2023-03-29 VITALS — Wt 219.0 lb

## 2023-03-29 DIAGNOSIS — E669 Obesity, unspecified: Secondary | ICD-10-CM | POA: Insufficient documentation

## 2023-03-29 DIAGNOSIS — E1169 Type 2 diabetes mellitus with other specified complication: Secondary | ICD-10-CM | POA: Insufficient documentation

## 2023-03-29 DIAGNOSIS — E66813 Obesity, class 3: Secondary | ICD-10-CM | POA: Diagnosis present

## 2023-03-29 DIAGNOSIS — N1832 Chronic kidney disease, stage 3b: Secondary | ICD-10-CM | POA: Insufficient documentation

## 2023-03-29 DIAGNOSIS — Z6841 Body Mass Index (BMI) 40.0 and over, adult: Secondary | ICD-10-CM | POA: Insufficient documentation

## 2023-03-29 NOTE — Progress Notes (Signed)
Medical Nutrition Therapy   Patient is here today alone.  She was last seen by an RD in our office on 03/29/2023  Has mostly stopped dark soda. Recent visit with her nephrologist who stated her kidney's were stable.  Unable to see these labs. Pt states she started Ozempic.  Pt states she did not know lettuce did not have carbohydrate. Pt states she has not done any bolus insulin since November stating she only keeps the basal insulin going.   Bariatric Surgery Type: RYGB     Surgery Date: 05/01/2018   NUTRITION ASSESSMENT  CGM Results from download: 06/09/2022 09/12/2022 01/19/2023 03/29/2023  % Time CGM active:    %   (Goal >70%)     Average glucose:   102 mg/dL for 14 days 811 x 14 days 104X14   Glucose management indicator:    % 6%    Time in range (70-180 mg/dL):   93 %   (Goal >91%) 92 88 72%  Time High (181-250 mg/dL):   2 %   (Goal < 47%) 5 3 5%  Time Very High (>250 mg/dL):    0 %   (Goal < 5%) 1 <1 1%  Time Low (54-69 mg/dL):   4 %   (Goal <8%) 1 7 17%  Time Very Low (<54 mg/dL):   1 %   (Goal <2%) 1 1 5%   She is on a Medtronic insulin pump - does not carbohydrate count.  Uses a set insulin dose for each meal. Other DM medication Farxiga CGM:  Dexcom G7  Anthropometrics  364 lbs highest adult weight Start weight at [facility]: 351.1 lbs  219 pounds 03/29/2023  Body Composition Scale 09/22/2020 10/26/2021 08/04/2022 10/05/2022  Weight 265.5 258.6 250.3 234.6  BMI 51 49.2 48.8 47.3  Total Body Fat % 49.6 22 22 20      Visceral Fat 23 50.7 51.1 52.6  Fat-Free Mass % 50.3 39.8 40 40.8     Total Body Water % 39.6 27.8 27.4 27.4     Muscle-Mass lbs 28 49.6 48.7 45  Body Fat Displacement             Torso  lbs 81.7 78.9 75.8 68.8         Left Leg  lbs 16.3 15.7 15.1 13.7         Right Leg  lbs 16.3 15.7 15.1 13.7         Left Arm  lbs 8.1 7.8 7.5 6.8         Right Arm   lbs 8.1 7.8 7.5 6.8   Medical dx:  CKD stage 3, DM type 2, CHF, HTN, HLD Labs: WNL (no access to renal  panel) Medications: Humalog insulin pump, ozempic, farxiga  24-Hr Dietary Recall First Meal: instant grits, coffee with cream and splenda Snack:   Second Meal: ham sandwich on [employer] bread, mayo, grapes or chic fila salad Snack: grapes Third Meal: banana, mayo sandwich on [employer] bread or chicken + green beans + rice Snack: cheese puffs or popcorn Beverages:  alkaline water, coffee with cream and splenda, diet cranberry juice, sugar free lemonade, zero soda (gingerale)   Estimated Daily Fluid Intake: 66+ ounces  Estimated Daily Protein Intake:  40 g Supplements: bariatric MVI, calcium, vitamin D and gummy fiber supplement Current average weekly physical activity: walking 2000 steps trying to walk to the mailbox and around the house (lessened since cold weather)    Signs/Symptoms  Using straws: no Drinking while eating: no  Chewing/swallowing difficulties: no Changes in vision: no Changes to mood/headaches: no Hair loss/changes to skin/nails: no Difficulty focusing/concentrating: no Sweating: no Dizziness/lightheadedness: no Palpitations: no Carbonated/caffeinated beverages: yes N/V/D/C/Gas: yes after box of Reece's pieces Abdominal pain: no Dumping syndrome: no   NUTRITION DIAGNOSIS  Overweight/obesity ([place]-3.3) related to past poor dietary habits and physical inactivity as evidenced by patient w/ completed RYGB surgery following dietary guidelines for continued weight loss and healthy nutrition status within the context of CKD and DM   NUTRITION INTERVENTION: continued Nutrition counseling (C-1) and education (E-2) to facilitate bariatric surgery goals, including: The importance of consuming adequate calories as well as certain nutrients daily due to the body's need for essential vitamins, minerals, and fats The importance of daily physical activity and to reach a goal of at least 150 minutes of moderate to vigorous physical activity weekly (or as directed by their physician) due to  benefits such as increased musculature and improved lab values Encouraged patient to honor their body's internal hunger and fullness cues.  Throughout the day, check in mentally and rate hunger. Stop eating when satisfied not full regardless of how much food is left on the plate.  Get more if still hungry 20-30 minutes later.  The key is to honor satisfaction so throughout the meal, rate fullness factor and stop when comfortably satisfied not physically full. The key is to honor hunger and fullness without any feelings of guilt or shame.  Pay attention to what the internal cues are, rather than any external factors. This will enhance the confidence you have in listening to your own body and following those internal cues enabling you to increase how often you eat when you are hungry not out of appetite and stop when you are satisfied not full.  Encouraged pt to continue to eat balanced meals inclusive of non starchy vegetables 2 times a day 7 days a week Encouraged pt to choose lean protein sources: limiting beef, pork, sausage, hotdogs, and lunch meat Encourage pt to choose healthy fats such as plant based limiting animal fats Encouraged pt to continue to drink a minium 64 fluid ounces with half being plain water to satisfy proper hydration  Educated on hypoglycemia correction  Hyperphosphoremia and its consequences  Previous: discussed contacting her provider if she continues to have lows to get her insulin pump settings changed. Previous: Discussed use of Dexcom G7 cell phone app and ability to change with next sensor change Previous: reviewed renal guidelines - low sodium, low phos previous: educated pt on needing real juice for a low not diet juice   Handouts Previously Provided Include  Meal ideas with appropriate portion size Mindful meals check list Eat this not that phosphorus list of foods  Carbohydrate list of foods  Readiness for Change: contemplating  Demonstrated degree of  understanding via: Teach Back     MONITORING & EVALUATION Dietary intake, weekly physical activity, and body weight follow up with Vernona Rieger for pump education in 1 month to discuss hypoglycemia events.

## 2023-04-03 DIAGNOSIS — E211 Secondary hyperparathyroidism, not elsewhere classified: Secondary | ICD-10-CM | POA: Diagnosis not present

## 2023-04-03 DIAGNOSIS — R809 Proteinuria, unspecified: Secondary | ICD-10-CM | POA: Diagnosis not present

## 2023-04-03 DIAGNOSIS — N189 Chronic kidney disease, unspecified: Secondary | ICD-10-CM | POA: Diagnosis not present

## 2023-04-03 DIAGNOSIS — E559 Vitamin D deficiency, unspecified: Secondary | ICD-10-CM | POA: Diagnosis not present

## 2023-04-03 DIAGNOSIS — D631 Anemia in chronic kidney disease: Secondary | ICD-10-CM | POA: Diagnosis not present

## 2023-04-06 DIAGNOSIS — E1140 Type 2 diabetes mellitus with diabetic neuropathy, unspecified: Secondary | ICD-10-CM | POA: Diagnosis not present

## 2023-04-06 DIAGNOSIS — L110 Acquired keratosis follicularis: Secondary | ICD-10-CM | POA: Diagnosis not present

## 2023-04-06 DIAGNOSIS — L609 Nail disorder, unspecified: Secondary | ICD-10-CM | POA: Diagnosis not present

## 2023-04-07 DIAGNOSIS — I5032 Chronic diastolic (congestive) heart failure: Secondary | ICD-10-CM | POA: Diagnosis not present

## 2023-04-07 DIAGNOSIS — E1122 Type 2 diabetes mellitus with diabetic chronic kidney disease: Secondary | ICD-10-CM | POA: Diagnosis not present

## 2023-04-07 DIAGNOSIS — N2581 Secondary hyperparathyroidism of renal origin: Secondary | ICD-10-CM | POA: Diagnosis not present

## 2023-04-07 DIAGNOSIS — R809 Proteinuria, unspecified: Secondary | ICD-10-CM | POA: Diagnosis not present

## 2023-04-13 ENCOUNTER — Ambulatory Visit: Payer: 59 | Attending: Cardiology | Admitting: Cardiology

## 2023-04-13 ENCOUNTER — Encounter: Payer: Self-pay | Admitting: Cardiology

## 2023-04-13 VITALS — BP 116/60 | HR 62 | Resp 16 | Ht 60.0 in | Wt 223.2 lb

## 2023-04-13 DIAGNOSIS — Z95818 Presence of other cardiac implants and grafts: Secondary | ICD-10-CM | POA: Diagnosis not present

## 2023-04-13 DIAGNOSIS — Z6841 Body Mass Index (BMI) 40.0 and over, adult: Secondary | ICD-10-CM

## 2023-04-13 DIAGNOSIS — E1122 Type 2 diabetes mellitus with diabetic chronic kidney disease: Secondary | ICD-10-CM

## 2023-04-13 DIAGNOSIS — I5032 Chronic diastolic (congestive) heart failure: Secondary | ICD-10-CM | POA: Diagnosis not present

## 2023-04-13 DIAGNOSIS — I10 Essential (primary) hypertension: Secondary | ICD-10-CM | POA: Diagnosis not present

## 2023-04-13 DIAGNOSIS — N1832 Chronic kidney disease, stage 3b: Secondary | ICD-10-CM | POA: Diagnosis not present

## 2023-04-13 DIAGNOSIS — E66813 Obesity, class 3: Secondary | ICD-10-CM | POA: Diagnosis not present

## 2023-04-13 NOTE — Patient Instructions (Addendum)
Medication Instructions:  Your physician recommends that you continue on your current medications as directed. Please refer to the Current Medication list given to you today.  If weight loss continues you may stop hydralazine for hypertension   *If you need a refill on your cardiac medications before your next appointment, please call your pharmacy*  Lab Work: None ordered.  If you have labs (blood work) drawn today and your tests are completely normal, you will receive your results only by: MyChart Message (if you have MyChart) OR A paper copy in the mail If you have any lab test that is abnormal or we need to change your treatment, we will call you to review the results.  Testing/Procedures: None ordered.  Follow-Up: At [facility], you and your health needs are our priority.  As part of our continuing mission to provide you with exceptional heart care, we have created designated Provider Care Teams.  These Care Teams include your primary Cardiologist (physician) and Advanced Practice Providers (APPs -  Physician Assistants and Nurse Practitioners) who all work together to provide you with the care you need, when you need it.   Your next appointment:   As needed  The format for your next appointment:   In Person  Provider:   Yates Decamp, MD  Important Information About Sugar

## 2023-04-13 NOTE — Progress Notes (Signed)
Cardiology Office Note:  .   Date:  04/13/2023  ID:  [name], DOB 11-19-1965, MRN [id number] PCP: Mirna Mires, MD  [facility] Providers Cardiologist:  None   History of Present Illness: .   [name] is a 58 y.o. 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   Discussed the use of AI scribe software for clinical note transcription with the patient, who gave verbal consent to proceed.  History of Present Illness   The patient, with a history of heart failure, hypertension, obesity, and diabetes, presents with concerns about recent weight gain over the past year. She has been actively trying to lose weight and has made significant progress, which has improved her overall health. She has been monitoring her weight at home, patient reports feeling good overall and has noticed improvements in her ability to perform daily activities.   She has been adhering to her medication regimen, which includes Ozempic, Farxiga, amlodipine, hydralazine, carvedilol, and torsemide. The patient also reports a change in appetite, with a decreased desire for bread and other foods. She has been trying to maintain a healthy diet and has been engaging in regular physical activity, such as walking.      Labs   Lab Results  Component Value Date   CHOL 169 06/28/2022   HDL 67 06/28/2022   LDLCALC 91 06/28/2022   TRIG 57 06/28/2022   Lab Results  Component Value Date   NA 144 06/28/2022   K 4.2 06/28/2022   CO2 23 06/28/2022   GLUCOSE 69 (L) 06/28/2022   BUN 31 (H) 06/28/2022   CREATININE 1.89 (H) 06/28/2022   CALCIUM 9.4 06/28/2022   EGFR  31 (L) 06/28/2022   GFRNONAA 24 (L) 09/23/2021      Latest Ref Rng & Units 06/28/2022   11:38 AM 09/23/2021    8:45 AM 03/02/2021    2:40 PM  BMP  Glucose 70 - 99 mg/dL 69  93  87   BUN 6 - 24 mg/dL 31  48  51   Creatinine 0.57 - 1.00 mg/dL 4.09  8.11  9.14   BUN/Creat Ratio 9 - 23 16   30    Sodium 134 - 144 mmol/L 144  141  144   Potassium 3.5 - 5.2 mmol/L 4.2  4.0  4.1   Chloride 96 - 106 mmol/L 103  108  106   CO2 20 - 29 mmol/L 23  28  24    Calcium 8.7 - 10.2 mg/dL 9.4  8.9  9.2       Latest Ref Rng & Units 10/30/2020    6:27 PM 05/02/2018    5:05 AM 05/01/2018   10:47 AM  CBC  WBC 4.0 - 10.5 K/uL 7.7  15.5    Hemoglobin 12.0 - 15.0 g/dL 78.2  95.6  21.3   Hematocrit 36.0 - 46.0 % 36.0  35.3  35.6   Platelets 150 - 400 K/uL 173  216     Lab Results  Component Value Date   HGBA1C 6.7 (H) 04/25/2018    Lab Results  Component Value Date   TSH 3.363 12/21/2014    External Labs:  PCP Labs 02/07/2023:  Serum  creatinine 2.020, potassium 4.3, creatinine clearance 22.9, EGFR 31 mL.  A1c 5.4%.  TSH normal at 1.780.  Review of Systems  Cardiovascular:  Positive for dyspnea on exertion (stable). Negative for chest pain and leg swelling.   Physical Exam:   VS:  BP 116/60 (BP Location: Left Arm, Patient Position: Sitting, Cuff Size: Large)   Pulse 62   Resp 16   Ht 5' (1.524 m)   Wt 223 lb 3.2 oz (101.2 kg)   LMP 07/29/2013   SpO2 99%   BMI 43.59 kg/m    Wt Readings from Last 3 Encounters:  04/13/23 223 lb 3.2 oz (101.2 kg)  03/29/23 219 lb (99.3 kg)  11/21/22 234 lb (106.1 kg)    Physical Exam Constitutional:      Appearance: She is morbidly obese.  Neck:     Vascular: No carotid bruit or JVD.  Cardiovascular:     Rate and Rhythm: Normal rate and regular rhythm.     Pulses: Intact distal pulses.     Heart sounds: Normal heart sounds. No murmur heard.    No gallop.  Pulmonary:     Effort: Pulmonary effort is normal.     Breath sounds: Normal breath sounds.   Abdominal:     General: Abdomen is protuberant. Bowel sounds are normal.     Palpations: Abdomen is soft.  Musculoskeletal:     Right lower leg: No edema.     Left lower leg: No edema.    Studies Reviewed: .    NA EKG:    EKG Interpretation Date/Time:  Thursday April 13 2023 09:11:04 EST Ventricular Rate:  61 PR Interval:  190 QRS Duration:  80 QT Interval:  412 QTC Calculation: 414 R Axis:   -1  Text Interpretation: EKG 04/13/2023: Normal sinus rhythm at rate of 61 bpm, poor R wave progression, probably normal variant.  Borderline low voltage complexes.  No significant change from 10/30/2020. Confirmed by Delrae Rend [phone]) on 04/13/2023 9:13:53 AM    Medications and allergies    No Known Allergies   Current Outpatient Medications:    acetaminophen (TYLENOL) 500 MG tablet, Take 500 mg by mouth every 6 (six) hours as needed for moderate pain., Disp: , Rfl:    albuterol (PROVENTIL HFA;VENTOLIN HFA) 108 (90 BASE) MCG/ACT inhaler, Inhale 2 puffs into the lungs every 6 (six) hours as needed for wheezing., Disp: , Rfl:    albuterol (PROVENTIL) (2.5 MG/3ML) 0.083% nebulizer solution, Take 3 mLs (2.5 mg total) by nebulization every 6 (six) hours as needed for wheezing or shortness of breath., Disp: 75 mL, Rfl: 12   amLODipine (NORVASC) 5 MG tablet, TAKE ONE TABLET BY MOUTH ONCE DAILY., Disp: 90 tablet, Rfl: 0   atorvastatin (LIPITOR) 40 MG tablet, Take 40 mg by mouth daily., Disp: , Rfl:    calcitRIOL (ROCALTROL) 0.25 MCG capsule, Take 0.25 mcg by mouth daily., Disp: , Rfl:    Calcium Carbonate-Vitamin D (CALCIUM 600+D PO), Take 1 tablet by mouth daily., Disp: , Rfl:    carboxymethylcellulose (REFRESH PLUS) 0.5 % SOLN, Place 1 drop into both eyes 3 (three) times daily as needed (dry eyes)., Disp: , Rfl:    carvedilol (COREG) 6.25 MG tablet, Take 6.25 mg by mouth 2 (two) times daily. , Disp: , Rfl:    Cholecalciferol (VITAMIN D) 2000 units tablet, Take 2,000 Units by mouth  daily. , Disp: , Rfl:    FARXIGA 5 MG TABS tablet, Take 5 mg by mouth daily., Disp: ,  Rfl:    FIBER ADULT GUMMIES PO, Take 3 capsules by mouth daily., Disp: , Rfl:    Fluticasone-Salmeterol (ADVAIR) 100-50 MCG/DOSE AEPB, Inhale 1 puff into the lungs daily as needed (shortness of breath)., Disp: , Rfl:    gabapentin (NEURONTIN) 300 MG capsule, Take 1 capsule (300 mg total) by mouth 4 (four) times daily. (Patient taking differently: Take 300 mg by mouth 4 (four) times daily as needed (pain).), Disp: 120 capsule, Rfl: 2   HUMALOG 100 UNIT/ML injection, Uses in insulin pump, Disp: , Rfl:    hydrALAZINE (APRESOLINE) 25 MG tablet, Take 1 tablet (25 mg total) by mouth 3 (three) times daily., Disp: 270 tablet, Rfl: 3   Insulin Human (INSULIN PUMP) SOLN, Inject into the skin., Disp: , Rfl:    levothyroxine (SYNTHROID, LEVOTHROID) 75 MCG tablet, Take 75 mcg by mouth daily before breakfast., Disp: , Rfl:    Lifitegrast (XIIDRA) 5 % SOLN, Place 1 drop into both eyes daily as needed (itchy eyes)., Disp: , Rfl:    montelukast (SINGULAIR) 10 MG tablet, Take 10 mg by mouth daily. , Disp: , Rfl:    Multiple Vitamins-Minerals (ADULT GUMMY PO), Take 1 capsule by mouth daily., Disp: , Rfl:    nystatin (MYCOSTATIN/NYSTOP) powder, Apply 1 Application topically 3 (three) times daily., Disp: 15 g, Rfl: 11   Semaglutide, 2 MG/DOSE, (OZEMPIC, 2 MG/DOSE,) 8 MG/3ML SOPN, INJECT 2MG  INTO THE SKIN ONCE A WEEK, Disp: 9 mL, Rfl: 3   torsemide (DEMADEX) 20 MG tablet, Take 40 mg by mouth daily. Change to daily from BID. 06/30/22, Disp: , Rfl:    ASSESSMENT AND PLAN: .      ICD-10-CM   1. Chronic diastolic CHF (congestive heart failure) (HCC)  I50.32 EKG 12-Lead    2. Essential hypertension  I10     3. Class 3 severe obesity due to excess calories with serious comorbidity and body mass index (BMI) of 40.0 to 44.9 in adult (HCC)  Z61.096    E66.01    Z68.41     4. Loop recorder Insertion of [employer] account [employer]. Serial # [id number] for [employer] DX heart failure trial 09/29/2021  Z95.818     5. Type 2 diabetes mellitus with stage 3b chronic kidney disease, without long-term current use of insulin (HCC)  E11.22    N18.32      Assessment and Plan    Heart Failure with Preserved Ejection Fraction (HFpEF) Chronic diastolic heart failure shows significant improvement with weight loss, no recent hospitalizations, and better physical activity tolerance. EKG and physical exam are normal.  She has a loop recorder in place that was placed for research which is now over, the loop recorder is in place and can remain unless removal is desired. Continue carvedilol 6.25 mg twice daily, hydralazine 25 mg three times daily, and torsemide once daily. Encourage ongoing weight loss and exercise. Monitor blood pressure and consider discontinuing hydralazine if blood pressure is consistently low.  Not on an ACE inhibitor or ARB due to stage IIIb-IV chronic kidney disease  Hypertension Hypertension is well-controlled with the current medication regimen. Blood pressure management remains crucial, especially with heart failure and ongoing weight loss. Hydralazine may be discontinued if another 10-15 pounds are lost and blood pressure remains low. Continue amlodipine and hydralazine, and monitor blood pressure regularly.  Chronic Kidney Disease Chronic kidney disease is managed with recent labs showing serum creatinine 2.02, potassium 4.3, creatinine clearance 22.9, and eGFR 31 mL/min. A1c is 5.4%, indicating  good glycemic control. Weight loss and current medications, including Farxiga, are beneficial for renal protection. Continue current medications, monitor kidney function regularly, and encourage weight loss to improve kidney function. Continue Farxiga for renal protection.  Obesity Significant weight loss and improved health are noted with Ozempic and Farxiga, with no adverse effects from Ozempic likely due to reduced food  intake. Continue Ozempic and Farxiga 5 mg. Encourage further weight loss through diet and exercise, consider joining a gym for structured exercise, and encourage walking in parks with a sister for additional physical activity.  General Health Maintenance Good health is maintained with regular check-ups and blood work. Recent labs include normal TSH and A1c of 5.4%. Continue regular follow-ups with the primary care physician and monitor vitamin D levels, especially in preparation for bunion surgery.  Follow-up Follow up with the primary care physician including prescription refills and return to the cardiologist if any new symptoms or issues arise.          Signed,  Yates Decamp, MD, [facility] 04/13/2023, 9:43 AM [facility] [address] [place], [place] [address] Phone: [phone]. Fax:  [phone]

## 2023-04-15 DIAGNOSIS — E108 Type 1 diabetes mellitus with unspecified complications: Secondary | ICD-10-CM | POA: Diagnosis not present

## 2023-04-18 DIAGNOSIS — M2021 Hallux rigidus, right foot: Secondary | ICD-10-CM | POA: Diagnosis not present

## 2023-04-18 DIAGNOSIS — M21611 Bunion of right foot: Secondary | ICD-10-CM | POA: Diagnosis not present

## 2023-05-02 DIAGNOSIS — R2689 Other abnormalities of gait and mobility: Secondary | ICD-10-CM | POA: Diagnosis not present

## 2023-05-05 ENCOUNTER — Encounter: Payer: 59 | Attending: Family Medicine | Admitting: Dietician

## 2023-05-05 ENCOUNTER — Encounter: Payer: Self-pay | Admitting: Dietician

## 2023-05-05 DIAGNOSIS — E1169 Type 2 diabetes mellitus with other specified complication: Secondary | ICD-10-CM | POA: Diagnosis not present

## 2023-05-05 DIAGNOSIS — E669 Obesity, unspecified: Secondary | ICD-10-CM | POA: Diagnosis present

## 2023-05-05 NOTE — Patient Instructions (Addendum)
 Avoid dark soda.  Use regular cranberry juice or OJ or regular sprite or gingerale. Take your medication consistently.  Avoid added salt. Choose a lean protein with each meal. Avoid processed meat.  Use leftover chicken or Low sodium tuna instead. - There is a low sodium [place] at some grocery stores.

## 2023-05-05 NOTE — Progress Notes (Signed)
 Medical Nutrition Therapy  Time [phone]  Patient is here today alone.  She was last seen by myself on 01/19/2023 and by another RD on 03/29/2023.  Stopped dark soda except for when her sugar is low.  Instructed her to buy the 7 oz cans of regular sprite or gingerale. She is taking a MVI, calcium, fiber gummy, and is going to get her vitamin D refilled.  Patient called her nephrology office during this visit and confirmed that she could take these.  I recommended against taking tums for calcium. Unable to see renal labs. Discussed her Dexcom and hypoglycemia events.  Discussed that she may be having compression lows.  Instructed her to do a finger stick to determine how to treat.  She states that she is only using the pump for basal as she does not eat a lot.  Discussed that she could just consider a basal insulin daily rather than use the pump but patient states that she likes the convenience of the pump. Discussed that she could speak with her doctor (states that she sees this doctor on Monday) to discuss her basal rate at night.     Bariatric Surgery Type: RYGB     Surgery Date: 05/01/2018   NUTRITION ASSESSMENT  CGM Results from download: 06/09/2022 09/12/2022 01/19/2023 03/29/2023 05/05/2023  % Time CGM active:    %   (Goal >70%)      Average glucose:   102 VW/UJW11 days 111 x14  104X14  111x14  Glucose management indicator:    % 6%     Time in range (70-180 mg/dL):   93 %   (Goal >91%) 92 88 72% 84  Time High (181-250 mg/dL):   2 %   (Goal < 47%) 5 3 5% 7  Time Very High (>250 mg/dL):    0 %   (Goal < 5%) 1 <1 1% 2  Time Low (54-69 mg/dL):   4 %   (Goal <8%) 1 7 17% 7  Time Very Low (<54 mg/dL):   1 %   (Goal <2%) 1 1 5% 1   She is on a Medtronic insulin pump - does not carbohydrate count.  Uses a set insulin dose for each meal. Other DM medication Farxiga CGM:  Dexcom G7  Anthropometrics  364 lbs highest adult weight Start weight at [facility]: 351.1 lbs  219 pounds 03/29/2023 225 lbs  05/05/2023  Body Composition Scale 09/22/2020 10/26/2021 08/04/2022 10/05/2022  Weight 265.5 258.6 250.3 234.6  BMI 51 49.2 48.8 47.3  Total Body Fat % 49.6 22 22 20      Visceral Fat 23 50.7 51.1 52.6  Fat-Free Mass % 50.3 39.8 40 40.8     Total Body Water % 39.6 27.8 27.4 27.4     Muscle-Mass lbs 28 49.6 48.7 45  Body Fat Displacement             Torso  lbs 81.7 78.9 75.8 68.8         Left Leg  lbs 16.3 15.7 15.1 13.7         Right Leg  lbs 16.3 15.7 15.1 13.7         Left Arm  lbs 8.1 7.8 7.5 6.8         Right Arm   lbs 8.1 7.8 7.5 6.8   Medical dx:  CKD stage 3, DM type 2, CHF, HTN, HLD Labs: WNL (no access to renal panel) Medications: Humalog insulin pump, ozempic, farxiga Insulin pump:  Medtronic 770G -  basal insulin only  24-Hr Dietary Recall First Meal: instant oatmeal, water Snack:   Second Meal: tomato sandwich with salt, regular potato chips Snack:  Third Meal: tacos with lettuce, tomato, cheese, sour cream (homemade) Beverages:  alkaline water, coffee with cream and splenda, diet cranberry juice, sugar free lemonade, zero soda (gingerale)   Estimated Daily Fluid Intake: 66+ ounces  Estimated Daily Protein Intake:  40 g Supplements: bariatric MVI, calcium, vitamin D and gummy fiber supplement Current average weekly physical activity: walking 2000 steps trying to walk to the mailbox and around the house (lessened since cold weather)    Signs/Symptoms  Using straws: no Drinking while eating: no Chewing/swallowing difficulties: no Changes in vision: no Changes to mood/headaches: no Hair loss/changes to skin/nails: no Difficulty focusing/concentrating: no Sweating: no Dizziness/lightheadedness: no Palpitations: no Carbonated/caffeinated beverages: yes N/V/D/C/Gas: yes after box of Reece's pieces Abdominal pain: no Dumping syndrome: no   NUTRITION DIAGNOSIS  Overweight/obesity ([place]-3.3) related to past poor dietary habits and physical inactivity as evidenced by  patient w/ completed RYGB surgery following dietary guidelines for continued weight loss and healthy nutrition status within the context of CKD and DM   NUTRITION INTERVENTION: continued Nutrition counseling (C-1) and education (E-2) to facilitate bariatric surgery goals, including: The importance of consuming adequate calories as well as certain nutrients daily due to the body's need for essential vitamins, minerals, and fats The importance of daily physical activity and to reach a goal of at least 150 minutes of moderate to vigorous physical activity weekly (or as directed by their physician) due to benefits such as increased musculature and improved lab values Encouraged patient to honor their body's internal hunger and fullness cues.  Throughout the day, check in mentally and rate hunger. Stop eating when satisfied not full regardless of how much food is left on the plate.  Get more if still hungry 20-30 minutes later.  The key is to honor satisfaction so throughout the meal, rate fullness factor and stop when comfortably satisfied not physically full. The key is to honor hunger and fullness without any feelings of guilt or shame.  Pay attention to what the internal cues are, rather than any external factors. This will enhance the confidence you have in listening to your own body and following those internal cues enabling you to increase how often you eat when you are hungry not out of appetite and stop when you are satisfied not full.  Encouraged pt to continue to eat balanced meals inclusive of non starchy vegetables 2 times a day 7 days a week Encouraged pt to choose lean protein sources: limiting beef, pork, sausage, hotdogs, and lunch meat Encourage pt to choose healthy fats such as plant based limiting animal fats Encouraged pt to continue to drink a minium 64 fluid ounces with half being plain water to satisfy proper hydration  Educated on hypoglycemia correction  Hyperphosphoremia and its  consequences  Discussed contacting her provider if she continues to have lows to get her insulin pump settings changed. Previous: reviewed renal guidelines - low sodium, low phos Reviewed that patient needs real juice for a low not diet juice  Instructed proper use of glucagon.  Handouts Previously Provided Include  Meal ideas with appropriate portion size Mindful meals check list Eat this not that phosphorus list of foods  Carbohydrate list of foods Bariatric Vitamin chart - this visit  Readiness for Change: contemplating  Demonstrated degree of understanding via: Teach Back     MONITORING & EVALUATION Dietary intake,  weekly physical activity, and body weight follow up with Jon Gills in 2 months and myself in 4 months.

## 2023-05-08 DIAGNOSIS — I5032 Chronic diastolic (congestive) heart failure: Secondary | ICD-10-CM | POA: Diagnosis not present

## 2023-05-08 DIAGNOSIS — E7800 Pure hypercholesterolemia, unspecified: Secondary | ICD-10-CM | POA: Diagnosis not present

## 2023-05-08 DIAGNOSIS — I10 Essential (primary) hypertension: Secondary | ICD-10-CM | POA: Diagnosis not present

## 2023-05-08 DIAGNOSIS — E039 Hypothyroidism, unspecified: Secondary | ICD-10-CM | POA: Diagnosis not present

## 2023-05-08 DIAGNOSIS — J45909 Unspecified asthma, uncomplicated: Secondary | ICD-10-CM | POA: Diagnosis not present

## 2023-05-08 DIAGNOSIS — I130 Hypertensive heart and chronic kidney disease with heart failure and stage 1 through stage 4 chronic kidney disease, or unspecified chronic kidney disease: Secondary | ICD-10-CM | POA: Diagnosis not present

## 2023-05-08 DIAGNOSIS — M21611 Bunion of right foot: Secondary | ICD-10-CM | POA: Diagnosis not present

## 2023-05-08 DIAGNOSIS — N184 Chronic kidney disease, stage 4 (severe): Secondary | ICD-10-CM | POA: Diagnosis not present

## 2023-05-08 DIAGNOSIS — E1169 Type 2 diabetes mellitus with other specified complication: Secondary | ICD-10-CM | POA: Diagnosis not present

## 2023-05-13 DIAGNOSIS — E108 Type 1 diabetes mellitus with unspecified complications: Secondary | ICD-10-CM | POA: Diagnosis not present

## 2023-05-15 DIAGNOSIS — Z79899 Other long term (current) drug therapy: Secondary | ICD-10-CM | POA: Diagnosis not present

## 2023-05-15 DIAGNOSIS — M21611 Bunion of right foot: Secondary | ICD-10-CM | POA: Diagnosis not present

## 2023-05-15 DIAGNOSIS — M2021 Hallux rigidus, right foot: Secondary | ICD-10-CM | POA: Diagnosis not present

## 2023-05-15 DIAGNOSIS — M25774 Osteophyte, right foot: Secondary | ICD-10-CM | POA: Diagnosis not present

## 2023-05-22 DIAGNOSIS — I5032 Chronic diastolic (congestive) heart failure: Secondary | ICD-10-CM | POA: Diagnosis not present

## 2023-05-22 DIAGNOSIS — R531 Weakness: Secondary | ICD-10-CM | POA: Diagnosis not present

## 2023-05-22 DIAGNOSIS — R2689 Other abnormalities of gait and mobility: Secondary | ICD-10-CM | POA: Diagnosis not present

## 2023-05-22 DIAGNOSIS — M21611 Bunion of right foot: Secondary | ICD-10-CM | POA: Diagnosis not present

## 2023-05-22 DIAGNOSIS — M2021 Hallux rigidus, right foot: Secondary | ICD-10-CM | POA: Diagnosis not present

## 2023-05-23 DIAGNOSIS — M2021 Hallux rigidus, right foot: Secondary | ICD-10-CM | POA: Diagnosis not present

## 2023-05-23 DIAGNOSIS — M21611 Bunion of right foot: Secondary | ICD-10-CM | POA: Diagnosis not present

## 2023-05-30 ENCOUNTER — Telehealth: Payer: Self-pay | Admitting: Cardiology

## 2023-05-30 NOTE — Telephone Encounter (Signed)
 Spoke with pharmacy. Clarified dose. Patient is taking 2 mg of semaglutide every week. Verbalized understanding

## 2023-05-30 NOTE — Telephone Encounter (Signed)
 Pt c/o medication issue:  1. Name of Medication: Semaglutide, 2 MG/DOSE, (OZEMPIC, 2 MG/DOSE,) 8 MG/3ML SOPN   2. How are you currently taking this medication (dosage and times per day)?   3. Are you having a reaction (difficulty breathing--STAT)? No  4. What is your medication issue? Per pharmacy, pt states that she is supposed to be on 1mg  as well as 2mg /dose, requesting cb to clarify

## 2023-06-06 DIAGNOSIS — M2021 Hallux rigidus, right foot: Secondary | ICD-10-CM | POA: Diagnosis not present

## 2023-06-13 DIAGNOSIS — E108 Type 1 diabetes mellitus with unspecified complications: Secondary | ICD-10-CM | POA: Diagnosis not present

## 2023-06-17 ENCOUNTER — Other Ambulatory Visit: Payer: Self-pay | Admitting: Cardiology

## 2023-06-23 DIAGNOSIS — H43821 Vitreomacular adhesion, right eye: Secondary | ICD-10-CM | POA: Diagnosis not present

## 2023-06-23 DIAGNOSIS — E133593 Other specified diabetes mellitus with proliferative diabetic retinopathy without macular edema, bilateral: Secondary | ICD-10-CM | POA: Diagnosis not present

## 2023-06-23 DIAGNOSIS — H04123 Dry eye syndrome of bilateral lacrimal glands: Secondary | ICD-10-CM | POA: Diagnosis not present

## 2023-06-23 DIAGNOSIS — H35372 Puckering of macula, left eye: Secondary | ICD-10-CM | POA: Diagnosis not present

## 2023-06-29 DIAGNOSIS — R531 Weakness: Secondary | ICD-10-CM | POA: Diagnosis not present

## 2023-06-29 DIAGNOSIS — M2021 Hallux rigidus, right foot: Secondary | ICD-10-CM | POA: Diagnosis not present

## 2023-06-29 DIAGNOSIS — I5032 Chronic diastolic (congestive) heart failure: Secondary | ICD-10-CM | POA: Diagnosis not present

## 2023-06-29 DIAGNOSIS — M21611 Bunion of right foot: Secondary | ICD-10-CM | POA: Diagnosis not present

## 2023-06-29 DIAGNOSIS — R2689 Other abnormalities of gait and mobility: Secondary | ICD-10-CM | POA: Diagnosis not present

## 2023-07-05 ENCOUNTER — Ambulatory Visit: Admitting: Skilled Nursing Facility1

## 2023-07-05 DIAGNOSIS — M21611 Bunion of right foot: Secondary | ICD-10-CM | POA: Diagnosis not present

## 2023-07-05 DIAGNOSIS — M2021 Hallux rigidus, right foot: Secondary | ICD-10-CM | POA: Diagnosis not present

## 2023-07-05 DIAGNOSIS — M14671 Charcot's joint, right ankle and foot: Secondary | ICD-10-CM | POA: Diagnosis not present

## 2023-07-13 DIAGNOSIS — E108 Type 1 diabetes mellitus with unspecified complications: Secondary | ICD-10-CM | POA: Diagnosis not present

## 2023-07-13 DIAGNOSIS — M19071 Primary osteoarthritis, right ankle and foot: Secondary | ICD-10-CM | POA: Diagnosis not present

## 2023-07-13 DIAGNOSIS — Z981 Arthrodesis status: Secondary | ICD-10-CM | POA: Diagnosis not present

## 2023-07-13 DIAGNOSIS — L609 Nail disorder, unspecified: Secondary | ICD-10-CM | POA: Diagnosis not present

## 2023-07-13 DIAGNOSIS — L110 Acquired keratosis follicularis: Secondary | ICD-10-CM | POA: Diagnosis not present

## 2023-07-13 DIAGNOSIS — E1140 Type 2 diabetes mellitus with diabetic neuropathy, unspecified: Secondary | ICD-10-CM | POA: Diagnosis not present

## 2023-07-18 ENCOUNTER — Encounter: Attending: Family Medicine | Admitting: Skilled Nursing Facility1

## 2023-07-18 ENCOUNTER — Encounter: Payer: Self-pay | Admitting: Skilled Nursing Facility1

## 2023-07-18 DIAGNOSIS — E669 Obesity, unspecified: Secondary | ICD-10-CM | POA: Insufficient documentation

## 2023-07-18 DIAGNOSIS — N1832 Chronic kidney disease, stage 3b: Secondary | ICD-10-CM | POA: Diagnosis not present

## 2023-07-18 DIAGNOSIS — E1169 Type 2 diabetes mellitus with other specified complication: Secondary | ICD-10-CM | POA: Insufficient documentation

## 2023-07-18 NOTE — Progress Notes (Signed)
 Medical Nutrition Therapy  Time [phone]  Patient is here today alone.    Pt states she is very proud of herself and the progress she has had with he rhealth and lifestyle changes. Pt states she is going to learn how to sautee her shrimp. Pt states she has really slowed down on eating chinese food.    Bariatric Surgery Type: RYGB     Surgery Date: 05/01/2018   NUTRITION ASSESSMENT  CGM Results from download: 06/09/2022 05/05/2023  % Time CGM active:    %   (Goal >70%)   Average glucose:   102 NW/GNF62 days 111x14  Glucose management indicator:    %   Time in range (70-180 mg/dL):   93 %   (Goal >13%) 84  Time High (181-250 mg/dL):   2 %   (Goal < 08%) 7  Time Very High (>250 mg/dL):    0 %   (Goal < 5%) 2  Time Low (54-69 mg/dL):   4 %   (Goal <6%) 7  Time Very Low (<54 mg/dL):   1 %   (Goal <5%) 1   She is on a Medtronic insulin  pump - does not carbohydrate count.  Uses a set insulin  dose for each meal. Other DM medication Farxiga CGM:  Dexcom G7  Anthropometrics  364 lbs highest adult weight Start weight at [facility]: 351.1 lbs  219 pounds 03/29/2023 225 lbs 05/05/2023  Body Composition Scale 10/26/2021 08/04/2022 10/05/2022  Weight 258.6 250.3 234.6  BMI 49.2 48.8 47.3  Total Body Fat % 22 22 20      Visceral Fat 50.7 51.1 52.6  Fat-Free Mass % 39.8 40 40.8     Total Body Water  % 27.8 27.4 27.4     Muscle-Mass lbs 49.6 48.7 45  Body Fat Displacement            Torso  lbs 78.9 75.8 68.8         Left Leg  lbs 15.7 15.1 13.7         Right Leg  lbs 15.7 15.1 13.7         Left Arm  lbs 7.8 7.5 6.8         Right Arm   lbs 7.8 7.5 6.8   Medical dx:  CKD stage 3, DM type 2, CHF, HTN, HLD Labs: WNL (no access to renal panel) Medications: Humalog  insulin  pump, ozempic , farxiga Insulin  pump:  Medtronic 770G - basal insulin  only  24-Hr Dietary Recall: pt states she eats to get comfortably full not overly full; jelly bellys First Meal: ensure complete Snack:   Second Meal: tomato  sandwich with salt, regular potato chips Snack: great value granola bar Third Meal: tacos with lettuce, tomato, cheese, sour cream (homemade) Snack: kodak bar dark chocolate or fat boy ice cream for cravings  Beverages:  alkaline water , (not as often) coffee with cream and splenda, diet cranberry juice, sugar free lemonade, zero soda (gingerale), unsweet tea + splenda    Estimated Daily Fluid Intake: 66+ ounces  Estimated Daily Protein Intake:  40 g Supplements: bariatric MVI, calcium , vitamin D  and gummy fiber supplement Current average weekly physical activity: Bunion surgery in a boot    Signs/Symptoms  Using straws: no Drinking while eating: no Chewing/swallowing difficulties: no Changes in vision: no Changes to mood/headaches: no Hair loss/changes to skin/nails: no Difficulty focusing/concentrating: no Sweating: no Dizziness/lightheadedness: no Palpitations: no Carbonated/caffeinated beverages: yes N/V/D/C/Gas: yes after box of Reece's pieces Abdominal pain: no Dumping syndrome: no  NUTRITION DIAGNOSIS  Overweight/obesity ([place]-3.3) related to past poor dietary habits and physical inactivity as evidenced by patient w/ completed RYGB surgery following dietary guidelines for continued weight loss and healthy nutrition status within the context of CKD and DM   NUTRITION INTERVENTION: continued Nutrition counseling (C-1) and education (E-2) to facilitate bariatric surgery goals, including: The importance of consuming adequate calories as well as certain nutrients daily due to the body's need for essential vitamins, minerals, and fats The importance of daily physical activity and to reach a goal of at least 150 minutes of moderate to vigorous physical activity weekly (or as directed by their physician) due to benefits such as increased musculature and improved lab values Encouraged patient to honor their body's internal hunger and fullness cues.  Throughout the day, check in  mentally and rate hunger. Stop eating when satisfied not full regardless of how much food is left on the plate.  Get more if still hungry 20-30 minutes later.  The key is to honor satisfaction so throughout the meal, rate fullness factor and stop when comfortably satisfied not physically full. The key is to honor hunger and fullness without any feelings of guilt or shame.  Pay attention to what the internal cues are, rather than any external factors. This will enhance the confidence you have in listening to your own body and following those internal cues enabling you to increase how often you eat when you are hungry not out of appetite and stop when you are satisfied not full.  Encouraged pt to continue to eat balanced meals inclusive of non starchy vegetables 2 times a day 7 days a week Encouraged pt to choose lean protein sources: limiting beef, pork, sausage, hotdogs, and lunch meat Encourage pt to choose healthy fats such as plant based limiting animal fats Encouraged pt to continue to drink a minium 64 fluid ounces with half being plain water  to satisfy proper hydration  Educated on hypoglycemia correction  Hyperphosphoremia and its consequences  Discussed contacting her provider if she continues to have lows to get her insulin  pump settings changed. Previous: reviewed renal guidelines - low sodium, low phos Reviewed that patient needs real juice for a low not diet juice  Instructed proper use of glucagon.  Handouts Previously Provided Include  Meal ideas with appropriate portion size Mindful meals check list Eat this not that phosphorus list of foods  Carbohydrate list of foods Bariatric Vitamin chart  Readiness for Change: contemplating  Demonstrated degree of understanding via: Teach Back     MONITORING & EVALUATION Dietary intake, weekly physical activity, and body weight follow up

## 2023-07-25 DIAGNOSIS — M960 Pseudarthrosis after fusion or arthrodesis: Secondary | ICD-10-CM | POA: Diagnosis not present

## 2023-07-25 DIAGNOSIS — M21611 Bunion of right foot: Secondary | ICD-10-CM | POA: Diagnosis not present

## 2023-07-25 DIAGNOSIS — Z4789 Encounter for other orthopedic aftercare: Secondary | ICD-10-CM | POA: Diagnosis not present

## 2023-07-26 DIAGNOSIS — E211 Secondary hyperparathyroidism, not elsewhere classified: Secondary | ICD-10-CM | POA: Diagnosis not present

## 2023-07-26 DIAGNOSIS — R809 Proteinuria, unspecified: Secondary | ICD-10-CM | POA: Diagnosis not present

## 2023-07-26 DIAGNOSIS — D631 Anemia in chronic kidney disease: Secondary | ICD-10-CM | POA: Diagnosis not present

## 2023-07-26 DIAGNOSIS — N189 Chronic kidney disease, unspecified: Secondary | ICD-10-CM | POA: Diagnosis not present

## 2023-07-29 DIAGNOSIS — N2581 Secondary hyperparathyroidism of renal origin: Secondary | ICD-10-CM | POA: Diagnosis not present

## 2023-07-29 DIAGNOSIS — D638 Anemia in other chronic diseases classified elsewhere: Secondary | ICD-10-CM | POA: Diagnosis not present

## 2023-07-29 DIAGNOSIS — R809 Proteinuria, unspecified: Secondary | ICD-10-CM | POA: Diagnosis not present

## 2023-07-29 DIAGNOSIS — E1129 Type 2 diabetes mellitus with other diabetic kidney complication: Secondary | ICD-10-CM | POA: Diagnosis not present

## 2023-08-03 DIAGNOSIS — M14671 Charcot's joint, right ankle and foot: Secondary | ICD-10-CM | POA: Diagnosis not present

## 2023-08-03 DIAGNOSIS — M960 Pseudarthrosis after fusion or arthrodesis: Secondary | ICD-10-CM | POA: Diagnosis not present

## 2023-08-03 DIAGNOSIS — M2021 Hallux rigidus, right foot: Secondary | ICD-10-CM | POA: Diagnosis not present

## 2023-08-03 DIAGNOSIS — M21611 Bunion of right foot: Secondary | ICD-10-CM | POA: Diagnosis not present

## 2023-08-03 DIAGNOSIS — Z4789 Encounter for other orthopedic aftercare: Secondary | ICD-10-CM | POA: Diagnosis not present

## 2023-08-14 DIAGNOSIS — E039 Hypothyroidism, unspecified: Secondary | ICD-10-CM | POA: Diagnosis not present

## 2023-08-14 DIAGNOSIS — I129 Hypertensive chronic kidney disease with stage 1 through stage 4 chronic kidney disease, or unspecified chronic kidney disease: Secondary | ICD-10-CM | POA: Diagnosis not present

## 2023-08-14 DIAGNOSIS — I10 Essential (primary) hypertension: Secondary | ICD-10-CM | POA: Diagnosis not present

## 2023-08-14 DIAGNOSIS — E7800 Pure hypercholesterolemia, unspecified: Secondary | ICD-10-CM | POA: Diagnosis not present

## 2023-08-14 DIAGNOSIS — I5032 Chronic diastolic (congestive) heart failure: Secondary | ICD-10-CM | POA: Diagnosis not present

## 2023-08-14 DIAGNOSIS — E1169 Type 2 diabetes mellitus with other specified complication: Secondary | ICD-10-CM | POA: Diagnosis not present

## 2023-08-14 DIAGNOSIS — E108 Type 1 diabetes mellitus with unspecified complications: Secondary | ICD-10-CM | POA: Diagnosis not present

## 2023-08-14 DIAGNOSIS — J45909 Unspecified asthma, uncomplicated: Secondary | ICD-10-CM | POA: Diagnosis not present

## 2023-08-14 DIAGNOSIS — N184 Chronic kidney disease, stage 4 (severe): Secondary | ICD-10-CM | POA: Diagnosis not present

## 2023-08-14 DIAGNOSIS — E1122 Type 2 diabetes mellitus with diabetic chronic kidney disease: Secondary | ICD-10-CM | POA: Diagnosis not present

## 2023-08-21 ENCOUNTER — Other Ambulatory Visit (HOSPITAL_COMMUNITY)
Admission: RE | Admit: 2023-08-21 | Discharge: 2023-08-21 | Disposition: A | Discharge status: Home / Self Care | Source: Ambulatory Visit | Attending: Obstetrics & Gynecology | Admitting: Obstetrics & Gynecology

## 2023-08-21 ENCOUNTER — Encounter: Payer: Self-pay | Admitting: Obstetrics & Gynecology

## 2023-08-21 ENCOUNTER — Ambulatory Visit (INDEPENDENT_AMBULATORY_CARE_PROVIDER_SITE_OTHER): Admitting: Obstetrics & Gynecology

## 2023-08-21 VITALS — BP 111/66 | Ht 60.0 in | Wt 225.6 lb

## 2023-08-21 DIAGNOSIS — N898 Other specified noninflammatory disorders of vagina: Secondary | ICD-10-CM

## 2023-08-21 DIAGNOSIS — Z113 Encounter for screening for infections with a predominantly sexual mode of transmission: Secondary | ICD-10-CM

## 2023-08-21 NOTE — Progress Notes (Addendum)
 GYN VISIT Patient name: [name] MRN [id number]  Date of birth: [birth year] Chief Complaint:   Vaginal Discharge  History of Present Illness:   [name] is a 58 y.o. G0P0000 PM female being seen today for the following concerns:  Vaginal discharge: Started about 2 mos ago- not sure what color, +fishy odor.  Denies vaginal bleeding.  Denies pelvic or abdominal pain.  No urinary concerns.  She has not tried anything OTC- not sure what this is.  Sexually active with same partner- desires STI screening.  Patient's last menstrual period was 07/29/2013.    Review of Systems:   Pertinent items are noted in HPI Denies fever/chills, dizziness, headaches, visual disturbances, fatigue, shortness of breath, chest pain, abdominal pain, vomiting Pertinent History Reviewed:   Past Surgical History:  Procedure Laterality Date   CARDIAC CATHETERIZATION  03/2013   normal coronary arteries, EF 55%   CARDIAC CATHETERIZATION  03/2013   normal coronary arteries   CATARACT EXTRACTION W/PHACO Right 12/10/2012   Procedure: CATARACT EXTRACTION PHACO AND INTRAOCULAR LENS PLACEMENT (IOC);  Surgeon: Cherene Mania, MD;  Location: [facility];  Service: Ophthalmology;  Laterality: Right;  CDE:22..42   CATARACT EXTRACTION W/PHACO Left 05/11/2015   Procedure: CATARACT EXTRACTION PHACO AND INTRAOCULAR LENS PLACEMENT LEFT EYE CDE=6.54;  Surgeon: Cherene Mania, MD;  Location: [facility];  Service: Ophthalmology;  Laterality: Left;   COLONOSCOPY N/A 09/22/2017   Procedure: COLONOSCOPY;  Surgeon: Harvey Margo CROME, MD;  Location: [facility];  Service: Endoscopy;  Laterality: N/A;  12:15   COLONOSCOPY WITH PROPOFOL  N/A 09/27/2021   Procedure: COLONOSCOPY WITH PROPOFOL ;  Surgeon: Cindie Carlin POUR, DO;  Location: [facility];  Service: Endoscopy;  Laterality: N/A;  8:15am   EYE SURGERY Left    GASTRIC BYPASS  05/01/2018   GASTRIC ROUX-EN-Y N/A 05/01/2018   Procedure: LAPAROSCOPIC ROUX-EN-Y GASTRIC BYPASS  WITH UPPER ENDOSCOPY WITH ERAS PATHWAY;  Surgeon: Kinsinger, Herlene Righter, MD;  Location: [facility];  Service: General;  Laterality: N/A;   LEFT HEART CATHETERIZATION WITH CORONARY ANGIOGRAM N/A 04/09/2013   Procedure: LEFT HEART CATHETERIZATION WITH CORONARY ANGIOGRAM;  Surgeon: Erick JONELLE Bergamo, MD;  Location: [facility] CATH LAB;  Service: Cardiovascular;  Laterality: N/A;   Loop recorder implantation     Insertion of [employer] Water quality scientist. Serial # [id number] 09/29/21 for heart failure research   TRACHEOSTOMY     at age 78 from asthma attack    Past Medical History:  Diagnosis Date   Acute asthma exacerbation 12/21/2014   Acute renal failure (HCC)    ARF (acute renal failure) (HCC) 05/03/2014   Asthma    Asthma exacerbation 05/03/2014   Asthma, severe persistent 05/03/2014   Chronic diastolic CHF (congestive heart failure) (HCC) 06/21/2018   Diabetes mellitus    Diabetes mellitus type 2 in obese 05/03/2014   History of echocardiogram 04/2014   LVH, diastolic dysfunction   Hyperlipidemia    Hypertension    Hyponatremia 12/02/2014   Obesity    Preop examination 05/18/2017   SOB (shortness of breath) 12/22/2014   Reviewed problem list, medications and allergies. Physical Assessment:   Vitals:   08/21/23 0930  BP: 111/66  Weight: 225 lb 9.6 oz (102.3 kg)  Height: 5' (1.524 m)  Body mass index is 44.06 kg/m.       Physical Examination:   General appearance: alert, well appearing, and in no distress  Psych: mood appropriate, normal affect  Skin: warm & dry   Cardiovascular: normal heart  rate noted  Respiratory: normal respiratory effort, no distress  Abdomen: soft, non-tender   Pelvic: normal external genitalia and vulva, minimal frothy white discharge noted.  Normal appearing cervix- no lesions or masses.  On bimanual exam no uterine tenderness or adnexal masses appreciated.  Extremities: Right leg in walking cast, 2+ edema left LE  Chaperone: Aleck Blase  Assessment & Plan:   1) STI screening/vaginal discharge -suspect BV, await results of vaginitis panel for treatment -reviewed safe sex practives   Orders Placed This Encounter  Procedures   RPR   HIV Antibody (routine testing w rflx)    Return for Sept 2026 annual.   Zoran Yankee, DO Attending Obstetrician & Gynecologist, [facility] for [facility], [facility]

## 2023-08-22 ENCOUNTER — Ambulatory Visit: Payer: Self-pay | Admitting: Obstetrics & Gynecology

## 2023-08-22 DIAGNOSIS — M21611 Bunion of right foot: Secondary | ICD-10-CM | POA: Diagnosis not present

## 2023-08-22 DIAGNOSIS — I5032 Chronic diastolic (congestive) heart failure: Secondary | ICD-10-CM | POA: Diagnosis not present

## 2023-08-22 DIAGNOSIS — R531 Weakness: Secondary | ICD-10-CM | POA: Diagnosis not present

## 2023-08-22 DIAGNOSIS — M2021 Hallux rigidus, right foot: Secondary | ICD-10-CM | POA: Diagnosis not present

## 2023-08-22 DIAGNOSIS — R2689 Other abnormalities of gait and mobility: Secondary | ICD-10-CM | POA: Diagnosis not present

## 2023-08-22 LAB — RPR: RPR Ser Ql: NONREACTIVE

## 2023-08-22 LAB — CERVICOVAGINAL ANCILLARY ONLY
Bacterial Vaginitis (gardnerella): POSITIVE — AB
Candida Glabrata: NEGATIVE
Candida Vaginitis: NEGATIVE
Chlamydia: NEGATIVE
Comment: NEGATIVE
Comment: NEGATIVE
Comment: NEGATIVE
Comment: NEGATIVE
Comment: NEGATIVE
Comment: NORMAL
Neisseria Gonorrhea: NEGATIVE
Trichomonas: NEGATIVE

## 2023-08-22 LAB — HIV ANTIBODY (ROUTINE TESTING W REFLEX): HIV Screen 4th Generation wRfx: NONREACTIVE

## 2023-08-24 ENCOUNTER — Other Ambulatory Visit: Payer: Self-pay | Admitting: Obstetrics & Gynecology

## 2023-08-24 DIAGNOSIS — B9689 Other specified bacterial agents as the cause of diseases classified elsewhere: Secondary | ICD-10-CM

## 2023-08-24 MED ORDER — METRONIDAZOLE 500 MG PO TABS: 500.0000 mg | ORAL_TABLET | Freq: Two times a day (BID) | ORAL | 0 refills | Status: AC

## 2023-08-24 NOTE — Progress Notes (Signed)
 Rx for BV  Myna Hidalgo, DO Attending Obstetrician & Gynecologist, [facility] for [employer], [facility]

## 2023-09-05 DIAGNOSIS — D638 Anemia in other chronic diseases classified elsewhere: Secondary | ICD-10-CM | POA: Diagnosis not present

## 2023-09-05 DIAGNOSIS — E1129 Type 2 diabetes mellitus with other diabetic kidney complication: Secondary | ICD-10-CM | POA: Diagnosis not present

## 2023-09-05 DIAGNOSIS — N184 Chronic kidney disease, stage 4 (severe): Secondary | ICD-10-CM | POA: Diagnosis not present

## 2023-09-05 DIAGNOSIS — I129 Hypertensive chronic kidney disease with stage 1 through stage 4 chronic kidney disease, or unspecified chronic kidney disease: Secondary | ICD-10-CM | POA: Diagnosis not present

## 2023-09-13 DIAGNOSIS — M960 Pseudarthrosis after fusion or arthrodesis: Secondary | ICD-10-CM | POA: Diagnosis not present

## 2023-09-13 DIAGNOSIS — M14671 Charcot's joint, right ankle and foot: Secondary | ICD-10-CM | POA: Diagnosis not present

## 2023-09-13 DIAGNOSIS — E1122 Type 2 diabetes mellitus with diabetic chronic kidney disease: Secondary | ICD-10-CM | POA: Diagnosis not present

## 2023-09-13 DIAGNOSIS — M79671 Pain in right foot: Secondary | ICD-10-CM | POA: Diagnosis not present

## 2023-09-13 DIAGNOSIS — M21611 Bunion of right foot: Secondary | ICD-10-CM | POA: Diagnosis not present

## 2023-09-14 ENCOUNTER — Ambulatory Visit: Admitting: Dietician

## 2023-09-15 DIAGNOSIS — E1165 Type 2 diabetes mellitus with hyperglycemia: Secondary | ICD-10-CM | POA: Diagnosis not present

## 2023-09-15 DIAGNOSIS — E1169 Type 2 diabetes mellitus with other specified complication: Secondary | ICD-10-CM | POA: Diagnosis not present

## 2023-09-18 DIAGNOSIS — E1169 Type 2 diabetes mellitus with other specified complication: Secondary | ICD-10-CM | POA: Diagnosis not present

## 2023-09-18 DIAGNOSIS — E1165 Type 2 diabetes mellitus with hyperglycemia: Secondary | ICD-10-CM | POA: Diagnosis not present

## 2023-09-21 ENCOUNTER — Encounter: Payer: Self-pay | Admitting: Dietician

## 2023-09-21 ENCOUNTER — Encounter: Attending: Family Medicine | Admitting: Dietician

## 2023-09-21 ENCOUNTER — Other Ambulatory Visit: Payer: Self-pay | Admitting: Cardiology

## 2023-09-21 DIAGNOSIS — E1169 Type 2 diabetes mellitus with other specified complication: Secondary | ICD-10-CM | POA: Insufficient documentation

## 2023-09-21 DIAGNOSIS — I5032 Chronic diastolic (congestive) heart failure: Secondary | ICD-10-CM | POA: Diagnosis not present

## 2023-09-21 DIAGNOSIS — E669 Obesity, unspecified: Secondary | ICD-10-CM | POA: Diagnosis present

## 2023-09-21 DIAGNOSIS — N1832 Chronic kidney disease, stage 3b: Secondary | ICD-10-CM | POA: Insufficient documentation

## 2023-09-21 DIAGNOSIS — M2021 Hallux rigidus, right foot: Secondary | ICD-10-CM | POA: Diagnosis not present

## 2023-09-21 DIAGNOSIS — M21611 Bunion of right foot: Secondary | ICD-10-CM | POA: Diagnosis not present

## 2023-09-21 DIAGNOSIS — R531 Weakness: Secondary | ICD-10-CM | POA: Diagnosis not present

## 2023-09-21 DIAGNOSIS — R2689 Other abnormalities of gait and mobility: Secondary | ICD-10-CM | POA: Diagnosis not present

## 2023-09-21 NOTE — Progress Notes (Signed)
 Medical Nutrition Therapy  Time [phone]  Patient is here today alone.  She was last seen by myself on 01/19/2023 and by another RD on 07/07/2023.  States that she is drinking mostly water , unsweetened home brewed tea with splenda and some zero soda.  Half unsweetened tea and lemonade when out.  She has stopped all dark soda. Question on label reading.  Eats deli meat and occasional hot dog.  Enjoys eating out.  Discussed that these are higher sodium meals. Choosing lower sodium canned soup. She is proud of the changes that she has made and how she is feeling. Surgery on her foot may need to be re-evaluated due to screws. She states that she is getting a pump upgrade to the 780g by next week.     Stopped dark soda except for when her sugar is low.  Instructed her to buy the 7 oz cans of regular sprite or gingerale. She is taking a MVI, calcium , fiber gummy, and is going to get her vitamin D  refilled.  Patient called her nephrology office during this visit and confirmed that she could take these.  I recommended against taking tums for calcium . Unable to see renal labs. Discussed her Dexcom and hypoglycemia events.  Discussed that she may be having compression lows.  Instructed her to do a finger stick to determine how to treat.  She states that she is only using the pump for basal as she does not eat a lot.  Discussed that she could just consider a basal insulin  daily rather than use the pump but patient states that she likes the convenience of the pump. Discussed that she could speak with her doctor (states that she sees this doctor on Monday) to discuss her basal rate at night.    Bariatric Surgery Type: RYGB     Surgery Date: 05/01/2018   NUTRITION ASSESSMENT  CGM Results from download: 06/09/2022 09/12/2022 01/19/23 03/29/23 05/05/23 09/21/23  % Time CGM active:    %   (Goal >70%)       Average glucose:   102 fh/iOk85 days 111 x14  104X14  111x14 129x14  Glucose management indicator:    % 6%     6.4  Time in range (70-180 mg/dL):   93 %   (Goal >29%) 92 88 72% 84 84  Time High (181-250 mg/dL):   2 %   (Goal < 74%) 5 3 5% 7 12  Time Very High (>250 mg/dL):    0 %   (Goal < 5%) 1 <1 1% 2 2  Time Low (54-69 mg/dL):   4 %   (Goal <5%) 1 7 17% 7 2  Time Very Low (<54 mg/dL):   1 %   (Goal <8%) 1 1 5% 1 0   She is on a Medtronic insulin  pump - does not carbohydrate count.  Uses a set insulin  dose for each meal. Other DM medication Farxiga CGM:  Dexcom G7  Anthropometrics  364 lbs highest adult weight Start weight at [facility]: 351.1 lbs  219 pounds 03/29/2023 225 lbs 05/05/2023 215 lbs 09/21/2023  Body Composition Scale 09/22/2020 10/26/2021 08/04/2022 10/05/2022  Weight 265.5 258.6 250.3 234.6  BMI 51 49.2 48.8 47.3  Total Body Fat % 49.6 22 22 20      Visceral Fat 23 50.7 51.1 52.6  Fat-Free Mass % 50.3 39.8 40 40.8     Total Body Water  % 39.6 27.8 27.4 27.4     Muscle-Mass lbs 28 49.6 48.7 45  Body  Fat Displacement             Torso  lbs 81.7 78.9 75.8 68.8         Left Leg  lbs 16.3 15.7 15.1 13.7         Right Leg  lbs 16.3 15.7 15.1 13.7         Left Arm  lbs 8.1 7.8 7.5 6.8         Right Arm   lbs 8.1 7.8 7.5 6.8   Medical dx:  CKD stage 3, DM type 2, CHF, HTN, HLD, s/p RYGB (05/01/2018) Labs: WNL (no access to renal panel) Medications: Humalog  insulin  pump, ozempic , farxiga Insulin  pump:  Medtronic 770G - basal insulin  only  24-Hr Dietary Recall First Meal: boiled egg, coffee with splenda Snack:  none Second Meal: [place] sandwich on white wheat bread Snack:  Third Meal: baked chicken, mashed potatoes, packaged gravy Snack:  cherries, 1/2 cup ice cream Beverages:  alkaline water , coffee with cream and splenda, diet cranberry juice, sugar free lemonade, zero soda (gingerale), unsweetened home brewed tea with splenda   Estimated Daily Fluid Intake: 64+ ounces  Estimated Daily Protein Intake:  40 g Supplements: bariatric MVI, calcium , vitamin D  and gummy fiber  supplement Current average weekly physical activity: walking 2000 steps trying to walk to the mailbox and around the house (lessened since cold weather)    Signs/Symptoms  Using straws: no Drinking while eating: no Chewing/swallowing difficulties: no Changes in vision: no Changes to mood/headaches: no Hair loss/changes to skin/nails: no Difficulty focusing/concentrating: no Sweating: no Dizziness/lightheadedness: no Palpitations: no Carbonated/caffeinated beverages: yes N/V/D/C/Gas: yes after box of Reece's pieces Abdominal pain: no Dumping syndrome: no   NUTRITION DIAGNOSIS  Overweight/obesity ([place]-3.3) related to past poor dietary habits and physical inactivity as evidenced by patient w/ completed RYGB surgery following dietary guidelines for continued weight loss and healthy nutrition status within the context of CKD and DM   NUTRITION INTERVENTION: continued Nutrition counseling (C-1) and education (E-2) to facilitate bariatric surgery goals, including: The importance of consuming adequate calories as well as certain nutrients daily due to the body's need for essential vitamins, minerals, and fats The importance of daily physical activity and to reach a goal of at least 150 minutes of moderate to vigorous physical activity weekly (or as directed by their physician) due to benefits such as increased musculature and improved lab values Encouraged patient to honor their body's internal hunger and fullness cues.  Throughout the day, check in mentally and rate hunger. Stop eating when satisfied not full regardless of how much food is left on the plate.  Get more if still hungry 20-30 minutes later.  The key is to honor satisfaction so throughout the meal, rate fullness factor and stop when comfortably satisfied not physically full. The key is to honor hunger and fullness without any feelings of guilt or shame.  Pay attention to what the internal cues are, rather than any external factors.  This will enhance the confidence you have in listening to your own body and following those internal cues enabling you to increase how often you eat when you are hungry not out of appetite and stop when you are satisfied not full.  Encouraged pt to continue to eat balanced meals inclusive of non starchy vegetables 2 times a day 7 days a week Encouraged pt to choose lean protein sources: limiting beef, pork, sausage, hotdogs, and lunch meat Encourage pt to choose healthy fats such as plant based limiting  animal fats Encouraged pt to continue to drink a minium 64 fluid ounces with half being plain water  to satisfy proper hydration  Educated on hypoglycemia correction  Hyperphosphoremia and its consequences  Discussed contacting her provider if she continues to have lows to get her insulin  pump settings changed. Previous: reviewed renal guidelines - low sodium, low phos Reviewed that patient needs real juice for a low not diet juice  Instructed proper use of glucagon.  Handouts Previously Provided Include  Meal ideas with appropriate portion size Mindful meals check list Eat this not that phosphorus list of foods  Carbohydrate list of foods Bariatric Vitamin chart - this visit  Readiness for Change: contemplating  Demonstrated degree of understanding via: Teach Back     MONITORING & EVALUATION Dietary intake, weekly physical activity, and body weight follow up with Thersia in 2 months and myself in 4 months.

## 2023-09-21 NOTE — Patient Instructions (Addendum)
 When eating out, ask that your food be prepared without salt. Read labels and choose foods that are lower in sodium Avoid using added salt. Have a portion of vegetables with each lunch and dinner. Have a simple meal prepared at home rather than eating out most often.

## 2023-10-16 DIAGNOSIS — M21611 Bunion of right foot: Secondary | ICD-10-CM | POA: Diagnosis not present

## 2023-10-16 DIAGNOSIS — I509 Heart failure, unspecified: Secondary | ICD-10-CM | POA: Diagnosis not present

## 2023-10-16 DIAGNOSIS — E1122 Type 2 diabetes mellitus with diabetic chronic kidney disease: Secondary | ICD-10-CM | POA: Diagnosis not present

## 2023-10-16 DIAGNOSIS — E11610 Type 2 diabetes mellitus with diabetic neuropathic arthropathy: Secondary | ICD-10-CM | POA: Diagnosis not present

## 2023-10-16 DIAGNOSIS — J45901 Unspecified asthma with (acute) exacerbation: Secondary | ICD-10-CM | POA: Diagnosis not present

## 2023-10-16 DIAGNOSIS — Z472 Encounter for removal of internal fixation device: Secondary | ICD-10-CM | POA: Diagnosis not present

## 2023-10-16 DIAGNOSIS — E039 Hypothyroidism, unspecified: Secondary | ICD-10-CM | POA: Diagnosis not present

## 2023-10-16 DIAGNOSIS — M960 Pseudarthrosis after fusion or arthrodesis: Secondary | ICD-10-CM | POA: Diagnosis not present

## 2023-10-16 DIAGNOSIS — I130 Hypertensive heart and chronic kidney disease with heart failure and stage 1 through stage 4 chronic kidney disease, or unspecified chronic kidney disease: Secondary | ICD-10-CM | POA: Diagnosis not present

## 2023-10-16 DIAGNOSIS — Z9884 Bariatric surgery status: Secondary | ICD-10-CM | POA: Diagnosis not present

## 2023-10-16 DIAGNOSIS — I10 Essential (primary) hypertension: Secondary | ICD-10-CM | POA: Diagnosis not present

## 2023-10-16 DIAGNOSIS — N183 Chronic kidney disease, stage 3 unspecified: Secondary | ICD-10-CM | POA: Diagnosis not present

## 2023-10-16 DIAGNOSIS — Z794 Long term (current) use of insulin: Secondary | ICD-10-CM | POA: Diagnosis not present

## 2023-10-16 DIAGNOSIS — M14671 Charcot's joint, right ankle and foot: Secondary | ICD-10-CM | POA: Diagnosis not present

## 2023-10-19 DIAGNOSIS — Z9884 Bariatric surgery status: Secondary | ICD-10-CM | POA: Diagnosis not present

## 2023-10-19 DIAGNOSIS — E569 Vitamin deficiency, unspecified: Secondary | ICD-10-CM | POA: Diagnosis not present

## 2023-10-22 DIAGNOSIS — M2021 Hallux rigidus, right foot: Secondary | ICD-10-CM | POA: Diagnosis not present

## 2023-10-22 DIAGNOSIS — R531 Weakness: Secondary | ICD-10-CM | POA: Diagnosis not present

## 2023-10-22 DIAGNOSIS — I5032 Chronic diastolic (congestive) heart failure: Secondary | ICD-10-CM | POA: Diagnosis not present

## 2023-10-22 DIAGNOSIS — M21611 Bunion of right foot: Secondary | ICD-10-CM | POA: Diagnosis not present

## 2023-10-22 DIAGNOSIS — R2689 Other abnormalities of gait and mobility: Secondary | ICD-10-CM | POA: Diagnosis not present

## 2023-10-23 DIAGNOSIS — E1169 Type 2 diabetes mellitus with other specified complication: Secondary | ICD-10-CM | POA: Diagnosis not present

## 2023-10-23 DIAGNOSIS — E1165 Type 2 diabetes mellitus with hyperglycemia: Secondary | ICD-10-CM | POA: Diagnosis not present

## 2023-10-24 DIAGNOSIS — Z4789 Encounter for other orthopedic aftercare: Secondary | ICD-10-CM | POA: Diagnosis not present

## 2023-10-24 DIAGNOSIS — M960 Pseudarthrosis after fusion or arthrodesis: Secondary | ICD-10-CM | POA: Diagnosis not present

## 2023-11-01 ENCOUNTER — Ambulatory Visit (INDEPENDENT_AMBULATORY_CARE_PROVIDER_SITE_OTHER)

## 2023-11-01 VITALS — BP 127/72 | HR 58 | Ht 60.5 in | Wt 205.2 lb

## 2023-11-01 DIAGNOSIS — E65 Localized adiposity: Secondary | ICD-10-CM | POA: Diagnosis not present

## 2023-11-01 DIAGNOSIS — Z9884 Bariatric surgery status: Secondary | ICD-10-CM

## 2023-11-01 DIAGNOSIS — L987 Excessive and redundant skin and subcutaneous tissue: Secondary | ICD-10-CM | POA: Diagnosis not present

## 2023-11-01 DIAGNOSIS — R21 Rash and other nonspecific skin eruption: Secondary | ICD-10-CM

## 2023-11-01 DIAGNOSIS — R634 Abnormal weight loss: Secondary | ICD-10-CM

## 2023-11-01 DIAGNOSIS — I5032 Chronic diastolic (congestive) heart failure: Secondary | ICD-10-CM

## 2023-11-01 DIAGNOSIS — Z6839 Body mass index (BMI) 39.0-39.9, adult: Secondary | ICD-10-CM

## 2023-11-01 DIAGNOSIS — E1169 Type 2 diabetes mellitus with other specified complication: Secondary | ICD-10-CM

## 2023-11-01 NOTE — Progress Notes (Signed)
 Plastic & Reconstructive Surgery New Patient Visit  Patient: [name] MRN: [id number] Date: 11/01/23  Referring Physician: Leigh Lung MD  Chief Complaint: Excess skin   History of Present Illness:  This is a 58 y.o. female with PMH and PSH as presented below who presents for consultation for body contouring following massive weight loss.  The patient underwent laparoscopic RYGB in 2019. Prior to the surgery, her highest weight was 356 lbs. Since then, she has lost almost 150 lbs. Her weight today is 205.2 lbs and Body mass index is 39.42 kg/m.. With the weight loss she has noted excess skin involving abdomen. Pt complains of rashes in between the skin folds which persist despite antifungal creams and powders.   Follows closely with Bariatrics [facility] Nutrition. No known nutritional issues. No hernias. Pt has DM on a pump, Asthma, CHF and CKD and does not smoke. Patient states that pannus affects her ambulation and her quality of life. Today, Body mass index is 39.42 kg/m.[name]  Past Medical History: Past Medical History:  Diagnosis Date   Acute asthma exacerbation 12/21/2014   Acute renal failure (HCC)    ARF (acute renal failure) (HCC) 05/03/2014   Asthma    Asthma exacerbation 05/03/2014   Asthma, severe persistent 05/03/2014   Chronic diastolic CHF (congestive heart failure) (HCC) 06/21/2018   Diabetes mellitus    Diabetes mellitus type 2 in obese 05/03/2014   History of echocardiogram 04/2014   LVH, diastolic dysfunction   Hyperlipidemia    Hypertension    Hyponatremia 12/02/2014   Obesity    Preop examination 05/18/2017   SOB (shortness of breath) 12/22/2014    Past Surgical History: Past Surgical History:  Procedure Laterality Date   CARDIAC CATHETERIZATION  03/2013   normal coronary arteries, EF 55%   CARDIAC CATHETERIZATION  03/2013   normal coronary arteries   CATARACT EXTRACTION W/PHACO Right 12/10/2012   Procedure: CATARACT EXTRACTION PHACO AND INTRAOCULAR LENS  PLACEMENT (IOC);  Surgeon: Cherene Mania, MD;  Location: [facility];  Service: Ophthalmology;  Laterality: Right;  CDE:22..42   CATARACT EXTRACTION W/PHACO Left 05/11/2015   Procedure: CATARACT EXTRACTION PHACO AND INTRAOCULAR LENS PLACEMENT LEFT EYE CDE=6.54;  Surgeon: Cherene Mania, MD;  Location: [facility];  Service: Ophthalmology;  Laterality: Left;   COLONOSCOPY N/A 09/22/2017   Procedure: COLONOSCOPY;  Surgeon: Harvey Margo CROME, MD;  Location: [facility];  Service: Endoscopy;  Laterality: N/A;  12:15   COLONOSCOPY WITH PROPOFOL  N/A 09/27/2021   Procedure: COLONOSCOPY WITH PROPOFOL ;  Surgeon: Cindie Carlin POUR, DO;  Location: [facility];  Service: Endoscopy;  Laterality: N/A;  8:15am   EYE SURGERY Left    GASTRIC BYPASS  05/01/2018   GASTRIC ROUX-EN-Y N/A 05/01/2018   Procedure: LAPAROSCOPIC ROUX-EN-Y GASTRIC BYPASS WITH UPPER ENDOSCOPY WITH ERAS PATHWAY;  Surgeon: Kinsinger, Herlene Righter, MD;  Location: [facility];  Service: General;  Laterality: N/A;   LEFT HEART CATHETERIZATION WITH CORONARY ANGIOGRAM N/A 04/09/2013   Procedure: LEFT HEART CATHETERIZATION WITH CORONARY ANGIOGRAM;  Surgeon: Erick JONELLE Bergamo, MD;  Location: [facility] CATH LAB;  Service: Cardiovascular;  Laterality: N/A;   Loop recorder implantation     Insertion of [employer] Water quality scientist. Serial # [id number] 09/29/21 for heart failure research   TRACHEOSTOMY     at age 4 from asthma attack    Current Medications: Current Outpatient Medications on File Prior to Visit  Medication Sig Dispense Refill   acetaminophen  (TYLENOL ) 500 MG tablet Take 500 mg by mouth every 6 (  six) hours as needed for moderate pain.     albuterol  (PROVENTIL  HFA;VENTOLIN  HFA) 108 (90 BASE) MCG/ACT inhaler Inhale 2 puffs into the lungs every 6 (six) hours as needed for wheezing.     albuterol  (PROVENTIL ) (2.5 MG/3ML) 0.083% nebulizer solution Take 3 mLs (2.5 mg total) by nebulization every 6 (six) hours as needed for wheezing or shortness of breath. 75 mL 12    amLODipine  (NORVASC ) 5 MG tablet TAKE ONE TABLET BY MOUTH ONCE DAILY. 90 tablet 0   atorvastatin  (LIPITOR) 40 MG tablet Take 40 mg by mouth daily.     calcitRIOL (ROCALTROL) 0.25 MCG capsule Take 0.25 mcg by mouth daily.     Calcium  Carbonate-Vitamin D  (CALCIUM  600+D PO) Take 1 tablet by mouth daily.     carboxymethylcellulose (REFRESH PLUS) 0.5 % SOLN Place 1 drop into both eyes 3 (three) times daily as needed (dry eyes).     carvedilol  (COREG ) 6.25 MG tablet Take 6.25 mg by mouth 2 (two) times daily.      Cholecalciferol (VITAMIN D ) 2000 units tablet Take 2,000 Units by mouth daily.      FARXIGA 5 MG TABS tablet Take 5 mg by mouth daily.     FIBER ADULT GUMMIES PO Take 3 capsules by mouth daily.     Fluticasone-Salmeterol (ADVAIR) 100-50 MCG/DOSE AEPB Inhale 1 puff into the lungs daily as needed (shortness of breath). (Patient not taking: Reported on 08/21/2023)     gabapentin  (NEURONTIN ) 300 MG capsule Take 1 capsule (300 mg total) by mouth 4 (four) times daily. 120 capsule 2   HUMALOG  100 UNIT/ML injection Uses in insulin  pump     hydrALAZINE  (APRESOLINE ) 25 MG tablet TAKE ONE TABLET BY MOUTH 3 TIMES A DAY 270 tablet 3   Insulin  Human (INSULIN  PUMP) SOLN Inject into the skin.     levothyroxine  (SYNTHROID , LEVOTHROID) 75 MCG tablet Take 75 mcg by mouth daily before breakfast.     Lifitegrast (XIIDRA) 5 % SOLN Place 1 drop into both eyes daily as needed (itchy eyes).     montelukast  (SINGULAIR ) 10 MG tablet Take 10 mg by mouth daily.      Multiple Vitamins-Minerals (ADULT GUMMY PO) Take 1 capsule by mouth daily.     nystatin  (MYCOSTATIN /NYSTOP ) powder Apply 1 Application topically 3 (three) times daily. 15 g 11   Semaglutide , 2 MG/DOSE, (OZEMPIC , 2 MG/DOSE,) 8 MG/3ML SOPN INJECT 2MG  INTO THE SKIN ONCE A WEEK 9 mL 3   torsemide  (DEMADEX ) 20 MG tablet Take 40 mg by mouth daily. Change to daily from BID. 06/30/22     No current facility-administered medications on file prior to visit.     Allergies: No Known Allergies  Family History:  Family history is negative for bleeding/clotting disorders, problems with anesthesia, or connective tissue disorders.   Social History:  Social History   Socioeconomic History   Marital status: Single    Spouse name: Not on file   Number of children: 0   Years of education: Not on file   Highest education level: Not on file  Occupational History   Not on file  Tobacco Use   Smoking status: Never   Smokeless tobacco: Never  Vaping Use   Vaping status: Never Used  Substance and Sexual Activity   Alcohol use: Yes    Comment: occasionally   Drug use: No   Sexual activity: Yes    Birth control/protection: None  Other Topics Concern   Not on file  Social History Narrative  Not on file   Social Drivers of Health   Financial Resource Strain: Not on file  Food Insecurity: Low Risk  (05/23/2023)   Received from [facility]   Hunger Vital Sign    Within the past 12 months, you worried that your food would run out before you got money to buy more: Never true    Within the past 12 months, the food you bought just didn't last and you didn't have money to get more. : Never true  Transportation Needs: No Transportation Needs (05/23/2023)   Received from [facility]    In the past 12 months, has lack of reliable transportation kept you from medical appointments, meetings, work or from getting things needed for daily living? : No  Physical Activity: Not on file  Stress: Not on file  Social Connections: Not on file   Smoker Denies Recreational Drug use: Denies  Review of systems: 10 point review of systems performed and negative except as noted in the HPI  Physical Exam: BP 127/72 (BP Location: Right Arm, Patient Position: Sitting, Cuff Size: Large)   Pulse (!) 58   Ht 5' 0.5 (1.537 m)   Wt 205 lb 3.2 oz (93.1 kg)   LMP 07/29/2013   SpO2 99%   BMI 39.42 kg/m  Body mass index is 39.42  kg/m.  Physical Exam           General: Well appearing, no apparent distress. Pulm: Breathing comfortably on room air without sounds/wheezing. CV: Regular rate. Good perfusion of extremities. Chest: Chest wall without abnormality or obvious deformity. No scoliosis, pectus excavatum or pectus carinatum. Abdomen: Soft, non-tender, no distension. No palpable hernia or bulge. Approximately 5 cm diastasis. Global lipodystrophy of abdomen with excess skin in both horizontal and vertical dimensions. Skin excess in infraumbilical region with infraumbilical fat deposits.Overhanging pannus below the mons/pubic symphysis. Redundant/ptotic mons tissue.  No current intertriginous rash. Well healed scars present from RYGB.  Neuro: Moving all four extremities spontaneously. Psych: Appropriate mood and affect.  Labs: Lab Results  Component Value Date   HGBA1C 6.7 (H) 04/25/2018   HGBA1C 14.8 (H) 12/02/2014   HGBA1C 10.9 (H) 05/06/2014   Pertinent Imaging:  None  Assessment: This is a 58 y.o. female with history of massive weight loss who presents for consultation for body contouring. The patient has maintained a 205.2 lb weight loss for over 6 months and has excess skin on their abdomen. Based on the patient's exam and discussion of patient's goals today, I believe the patient would be an appropriate candidate for panniculectomy.   We discussed the patient's goals and priorities at length. We reviewed that body contouring operations trade better contours at the expense of long, sometimes wide scars. We further discussed the operation(s) in detail including the incision and scar patterns, need for surgical drains and importance of postoperative compression. We reviewed that massive weight loss patients are at a higher risk for recurrent laxity and wound healing problems including dehiscence and infection. We further discussed the risks of bleeding, seroma, hematoma, asymmetries, standing cutaneous  deformities or other contour deformities, asymmetry, skin/nipple/umbilical necrosis, fat necrosis/oil cysts, lymphedema, chronic wounds or sinus tracts, hypertrophic and keloid scarring, nerve injury (ie. LFCN, iliohypogastric, ilioinguinal nerves), numbness, paresthesia and sensory changes, intraabdominal injury. Revision procedures are not uncommon. We also discussed the risk of DVT/PE, fat embolism, heart attack, stroke, death as well as the risks of anesthesia. We reviewed the expected recovery period with no heavy activities or  lifting >5lbs for 6 weeks postoperatively.   Timing of body contouring procedures should allow 12 months after bariatric surgery and 6 months at a stable weight for patients to achieve metabolic and nutritional homeostasis.  The patient's BMI today is Body mass index is 39.42 kg/m.[name] We discussed that patients with BMI>30 are more likely to suffer both minor and major complications following these procedures. The patient verbalized understanding of this concept and is willing to accept these increased risks.   The patient voiced understanding and wishes to proceed. All questions and concerns were addressed to the patient's apparent satisfaction.  Plan - We will plan for panniculectomy - Hb A1c ordered - 3 hours under general anesthesia. - Will submit for pre-determination with insurance. - Scheduling pending insurance.  - We will need cardiology and PCP clearances. Hb A1c ordered  The sensitive parts of the examination/procedure were performed with MA as chaperone.  The time documented represents the total time spent on the day of the encounter in preparing for and completing the visit. It does not include time spent by ancillary staff, a resident, a fellow, another trainee, or, for shared visits, time spent jointly with the patient or discussing the case or the performance of other separately performed services.  Time spent: 45 minutes.     Mylasia Vorhees,  MD [facility]  11/01/2023 12:56 PM

## 2023-11-06 DIAGNOSIS — I10 Essential (primary) hypertension: Secondary | ICD-10-CM | POA: Diagnosis not present

## 2023-11-06 DIAGNOSIS — E1169 Type 2 diabetes mellitus with other specified complication: Secondary | ICD-10-CM | POA: Diagnosis not present

## 2023-11-06 DIAGNOSIS — E1122 Type 2 diabetes mellitus with diabetic chronic kidney disease: Secondary | ICD-10-CM | POA: Diagnosis not present

## 2023-11-06 DIAGNOSIS — J45909 Unspecified asthma, uncomplicated: Secondary | ICD-10-CM | POA: Diagnosis not present

## 2023-11-06 DIAGNOSIS — E039 Hypothyroidism, unspecified: Secondary | ICD-10-CM | POA: Diagnosis not present

## 2023-11-06 DIAGNOSIS — Z794 Long term (current) use of insulin: Secondary | ICD-10-CM | POA: Diagnosis not present

## 2023-11-06 DIAGNOSIS — E7800 Pure hypercholesterolemia, unspecified: Secondary | ICD-10-CM | POA: Diagnosis not present

## 2023-11-06 DIAGNOSIS — I130 Hypertensive heart and chronic kidney disease with heart failure and stage 1 through stage 4 chronic kidney disease, or unspecified chronic kidney disease: Secondary | ICD-10-CM | POA: Diagnosis not present

## 2023-11-06 DIAGNOSIS — I5032 Chronic diastolic (congestive) heart failure: Secondary | ICD-10-CM | POA: Diagnosis not present

## 2023-11-06 DIAGNOSIS — N184 Chronic kidney disease, stage 4 (severe): Secondary | ICD-10-CM | POA: Diagnosis not present

## 2023-11-08 DIAGNOSIS — M960 Pseudarthrosis after fusion or arthrodesis: Secondary | ICD-10-CM | POA: Diagnosis not present

## 2023-11-08 DIAGNOSIS — M21611 Bunion of right foot: Secondary | ICD-10-CM | POA: Diagnosis not present

## 2023-11-08 DIAGNOSIS — Z4789 Encounter for other orthopedic aftercare: Secondary | ICD-10-CM | POA: Diagnosis not present

## 2023-11-08 DIAGNOSIS — M14671 Charcot's joint, right ankle and foot: Secondary | ICD-10-CM | POA: Diagnosis not present

## 2023-11-08 NOTE — Progress Notes (Signed)
 Chief Complaint: Chief Complaint  Patient presents with  . Post-op    Right foot post surgery     Post-op    Patient returns to clinic for evaluation 3 weeks s/p hardware removal right foot 8/18.  Patient presents to clinic in surgical shoe and wheel chair. Patient states she has minimal pain and is doing well without narcotic. No acute complaints. Patient denies nausea, vomiting, fever, chills, chest pain, shortness of breath, calf pain.  ROS: Negative except as listed in HPI    VITALS Vitals:   11/08/23 1347  BP: 126/88  Pulse: 59  Resp: 16  SpO2: 97%    Past Medical Hx: Medical History[1]  Current Meds: Current Medications[2]  Allergies: Allergies[3]  Exam: Neurovascular supply intact. Right hallux remains well aligned.  No erythema or signs of infection.  Incisions healing without drainage.  Sutures intact and no signs of infection.  No abnormal swelling and no calf tenderness. Negative Hohmans sign.  Intact capillary fill time to all toes.  Hallux remains in rectus alignment.  Imaging: Today I ordered and independently interpreted radiographs 3 views right foot showing interval change status post hardware resection with incomplete osseous union at the first metatarsophalangeal joint and mild lateral deviation of hallux. Chronic midfoot and rearfoot degenerative changes consistent with charcot   We reviewed her intraoperative bone cultures which were no growth x4    Dx: 1. Surgical aftercare, musculoskeletal system  XR Foot Minimum 3 Views Right    2. Charcot joint of right foot      3. 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   - new xrays reviewed - great toe is in stable alignment clinically and without pain - sutures removed and skin healed; ok to shower but should not scrub or soak incision site - will order new  custom DM shoes today and transition to WBAT in sneakers - again discussed if any change in clinical or symptomatic presentation may need to consider revision arthrodesis as bone cultures were negative however she is pleased with her progress at this point so will continue to monitor    Patient currently in agreement with the plan.  All questions were answered to the patient's satisfaction.  Patient is to return to clinic in 3 weeks   Future Appt.: Scheduled Future Appointments       Provider Department Dept Phone Center   11/29/2023 8:30 AM Manuelita Crank [facility] [place]  - Podiatry [place] [phone] [facility] [place]        11/08/2023    Electronically signed by: Manuelita Crank Rave, DPM 11/08/2023 5:16 PM          [1] Past Medical History: Diagnosis Date  . Ambulates with cane   . Asthma (CMD)    allergies  . CHF (congestive heart failure)    (CMD)    managed by [facility] cardiology Dr [place]  . Diabetes type 2, controlled    (CMD)   . Hypertension   . Hypothyroidism   . Stage 3 chronic kidney disease (CMD)   [2]  Current Outpatient Medications:  .  acetaminophen  (TYLENOL ) 500 mg tablet, Take 500 mg by mouth every 6 (six) hours as needed for mild pain (1-3)., Disp: , Rfl:  .  albuterol  HFA (PROVENTIL  HFA;VENTOLIN  HFA;PROAIR  HFA) 90 mcg/actuation  inhaler, Inhale 2 puffs every 6 (six) hours as needed for wheezing., Disp: , Rfl:  .  albuterol  sulfate 2.5 mg/0.5 mL nebu nebulizer solution, Take 2.5 mg by nebulization every 6 (six) hours as needed for wheezing., Disp: , Rfl:  .  amLODIPine  (NORVASC ) 5 mg tablet, Take 5 mg by mouth daily., Disp: , Rfl:  .  artificial tears (HYPOTEARS) 1.4-0.6 % dpet eye drops, Administer 1 drop into both eyes as needed., Disp: , Rfl:  .  aspirin  81 mg chewable tablet, Chew 1 tablet (81 mg total) 2 (two) times a day., Disp: 84 tablet, Rfl: 0 .  atorvastatin  (LIPITOR) 40 mg tablet, Take 40 mg by mouth every  morning., Disp: , Rfl:  .  calcitrioL (ROCALTROL) 0.25 mcg capsule, Take 0.25 mcg by mouth daily., Disp: , Rfl:  .  CALCIUM  CARBONATE-VITAMIN D2 ORAL, Take 1 tablet by mouth daily., Disp: , Rfl:  .  carboxymethylcellulose (REFRESH PLUS) 0.5 % dpet, Administer 1 drop into both eyes every hour as needed., Disp: , Rfl:  .  carvediloL  (COREG ) 6.25 mg tablet, Take 6.25 mg by mouth in the morning and 6.25 mg in the evening. Take with meals., Disp: , Rfl:  .  cholecalciferol (VITAMIN D3) 2,000 unit tablet, Take 2,000 Units by mouth daily., Disp: , Rfl:  .  dapagliflozin propanediol (FARXIGA) 5 mg tab tablet, Take 5 mg by mouth daily., Disp: , Rfl:  .  FIBER, DEXTRIN, ORAL, Take 3 capsules by mouth daily., Disp: , Rfl:  .  fluticasone propion-salmeteroL (ADVAIR DISKUS) 100-50 mcg/dose diskus inhaler, Inhale 1 puff  in the morning and 1 puff before bedtime., Disp: , Rfl:  .  gabapentin  (NEURONTIN ) 300 mg capsule, Take 300 mg by mouth every morning., Disp: , Rfl:  .  hydrALAZINE  (APRESOLINE ) 25 mg tablet, Take 25 mg by mouth 3 (three) times a day., Disp: , Rfl:  .  insulin  lispro (HumaLOG ) 100 unit/mL injection, Inject under the skin 3 (three) times a day before meals. Used to put in insulin  human pump, Disp: , Rfl:  .  insulin  regular U-500 CONCENTRATED (HumuLIN R  U-500, Conc, Insulin ) 500 unit/mL injection, Inject under the skin See Admin Instructions., Disp: , Rfl:  .  levothyroxine  (SYNTHROID ) 75 mcg tablet, Take 75 mcg by mouth in the morning., Disp: , Rfl:  .  lifitegrast (XIIDRA) 5 % dpet ophthalmic solution, Administer 1 drop into both eyes 2 (two) times a day., Disp: , Rfl:  .  montelukast  (SINGULAIR ) 10 mg tablet, Take 10 mg by mouth at bedtime., Disp: , Rfl:  .  multivitamin with minerals tab, Take 1 tablet by mouth., Disp: , Rfl:  .  nystatin  (MYCOSTATIN ) 100,000 unit/gram powder, Apply topically 3 times a day., Disp: , Rfl:  .  semaglutide  (OZEMPIC ) 2 mg/dose (8 mg/3 mL) pnij, Inject 2 mg under  the skin every 7 days. Take on Wednesdays  Last dose 8/6; hold dose 8/13 for surgery 10/16/23, Disp: , Rfl:  .  torsemide  (DEMADEX ) 20 mg tablet, Take 20 mg by mouth daily., Disp: , Rfl:  [3] No Known Allergies

## 2023-11-13 NOTE — Progress Notes (Addendum)
 " Cardiology Office Note    Patient Name: [name] Date of Encounter: 11/13/2023  Primary Care Provider:  Leigh Lung, MD Primary Cardiologist:  None Primary Electrophysiologist: None   Past Medical History    Past Medical History:  Diagnosis Date   Acute asthma exacerbation 12/21/2014   Acute renal failure (HCC)    ARF (acute renal failure) (HCC) 05/03/2014   Asthma    Asthma exacerbation 05/03/2014   Asthma, severe persistent 05/03/2014   Chronic diastolic CHF (congestive heart failure) (HCC) 06/21/2018   Diabetes mellitus    Diabetes mellitus type 2 in obese 05/03/2014   History of echocardiogram 04/2014   LVH, diastolic dysfunction   Hyperlipidemia    Hypertension    Hyponatremia 12/02/2014   Obesity    Preop examination 05/18/2017   SOB (shortness of breath) 12/22/2014    History of Present Illness  [name] is a 58 y.o. female with a PMH of HFpEF, HTN, HLD, CKD stage III/IV, DM type II, hypothyroidism, history of OSA, asthma, morbid obesity s/p Roux-en-Y gastric bypass 2020 who presents today for follow-up and preoperative clearance.  [name] has been followed by Dr. Ladona since 2015 when she was evaluated and seen in the hospital for complaint of chest pain.  She underwent Roux-en-Y gastric bypass on 05/01/2018 and lost over 100 pounds.  She completed a left heart cath that showed normal coronaries.  She completed 2D echo on 12/2020 that showed normal EF with no significant valve abnormalities.  She underwent a renal artery ultrasound that showed no evidence of stenosis.  Completed carotid ultrasound on 04/2022 showing no evidence of significant stenosis bilaterally with minimal plaque.  She was last seen by Dr. Ladona on 04/13/2023 for follow-up.  During her visit patient's blood pressure was well-controlled on current regimen with stable volume status and no acute complaints.  She recently had bunionectomy procedure.  [name] presents today for  preoperative clearance. She is scheduled for a panniculectomy and requires cardiac clearance due to her history of diastolic dysfunction. No new cardiac symptoms such as chest pain, shortness of breath, palpitations, or dizziness. She has a history of a loop recorder implanted for a heart failure trial, which is no longer functional as the battery has died. She is considering having it removed as she is no longer part of the research study. Her current medications include amlodipine , carvedilol , torsemide  (changed to daily from BID), Farxiga, and Ozempic . No side effects from her medications, including no swelling from amlodipine  and no dizziness from carvedilol . She has a history of elevated creatinine at 2.3. She is able to perform daily activities such as walking indoors, shopping with a cart, and doing household chores. She uses a scooter for longer distances but can walk the equivalent of a block in a grocery store. Patient denies chest pain, palpitations, dyspnea, PND, orthopnea, nausea, vomiting, dizziness, syncope, edema, weight gain, or early satiety.  Discussed the use of AI scribe software for clinical note transcription with the patient, who gave verbal consent to proceed.  History of Present Illness   Review of Systems  Please see the history of present illness.    All other systems reviewed and are otherwise negative except as noted above.  Physical Exam    Wt Readings from Last 3 Encounters:  11/01/23 205 lb 3.2 oz (93.1 kg)  08/21/23 225 lb 9.6 oz (102.3 kg)  04/13/23 223 lb 3.2 oz (101.2 kg)   CD:Uyzmz were no vitals filed for this  visit.,There is no height or weight on file to calculate BMI. GEN: Well nourished, well developed in no acute distress Neck: No JVD; No carotid bruits Pulmonary: Clear to auscultation without rales, wheezing or rhonchi  Cardiovascular: Normal rate. Regular rhythm. Normal S1. Normal S2.   Murmurs: There is no murmur.  ABDOMEN: Soft, non-tender,  non-distended EXTREMITIES: +1 lower extremity edema to left ankle and unable to assess right foot due to brace  EKG/LABS/ Recent Cardiac Studies   ECG personally reviewed by me today -sinus rhythm with rate of 56 bpm no acute changes consistent with previous EKG.  Risk Assessment/Calculations:          Lab Results  Component Value Date   WBC 7.7 10/30/2020   HGB 11.0 (L) 10/30/2020   HCT 36.0 10/30/2020   MCV 89.8 10/30/2020   PLT 173 10/30/2020   Lab Results  Component Value Date   CREATININE 1.89 (H) 06/28/2022   BUN 31 (H) 06/28/2022   NA 144 06/28/2022   K 4.2 06/28/2022   CL 103 06/28/2022   CO2 23 06/28/2022   Lab Results  Component Value Date   CHOL 169 06/28/2022   HDL 67 06/28/2022   LDLCALC 91 06/28/2022   TRIG 57 06/28/2022    Lab Results  Component Value Date   HGBA1C 6.7 (H) 04/25/2018   Assessment & Plan    Assessment & Plan   1.  Preop clearance: - Patient's RCRI score is 11% The patient affirms she has been doing well without any new cardiac symptoms. They are able to achieve 6 METS without cardiac limitations. Therefore, based on ACC/AHA guidelines, the patient would be at acceptable risk for the planned procedure without further cardiovascular testing. The patient was advised that if she develops new symptoms prior to surgery to contact our office to arrange for a follow-up visit, and she verbalized understanding.   -Patient directed to hold Ozempic  1 week prior to scheduled procedure. - Guidance for holding Doreen should come from prescribing provider.  2.  HFpEF: - 2D echo previously completed on 12/2020 showing normal LV function with EF of 55 to 60% and no evidence of PASP - Patient is euvolemic with no new complaints of shortness of breath. - Continue hydralazine  25 mg 3 times daily, Farxiga 5 mg, carvedilol  6.25 mg twice daily torsemide  40 mg daily -Low sodium diet, fluid restriction <2L, and daily weights encouraged. Educated to contact  our office for weight gain of 2 lbs overnight or 5 lbs in one week.   3.  Essential HTN: - Patient's blood pressure today is stable at 137/76 - Continue hydralazine  25 mg 3 times daily and carvedilol  6.25 mg twice daily, amlodipine  5 mg daily  4.  DM type II: - Patient's last hemoglobin A1c was 5.5% - Continue Ozempic  current treatment plan per PCP  5.  CKD stage III: - Patient's creatinine is 2.3 - Patient currently not on ACE or ARB due to CKD. -Currently followed by nephrologist.  6. Implanted loop recorder, status post heart failure trial: Device is non-functional due to dead battery. She is no longer in the research study and interested in removal. - Notify Dr. Ladona of interest in loop recorder removal. - Coordinate removal procedure with Dr. Ladona.     Disposition: Follow-up with None or APP in 12 months    Signed, Wyn Raddle, Jackee Shove, NP 11/13/2023, 6:01 PM [facility] "

## 2023-11-14 ENCOUNTER — Telehealth: Payer: Self-pay

## 2023-11-14 ENCOUNTER — Encounter: Payer: Self-pay | Admitting: Nurse Practitioner

## 2023-11-14 ENCOUNTER — Ambulatory Visit: Attending: Nurse Practitioner | Admitting: Nurse Practitioner

## 2023-11-14 VITALS — BP 137/76 | HR 56 | Ht 60.05 in | Wt 206.0 lb

## 2023-11-14 DIAGNOSIS — Z01810 Encounter for preprocedural cardiovascular examination: Secondary | ICD-10-CM | POA: Diagnosis not present

## 2023-11-14 DIAGNOSIS — I10 Essential (primary) hypertension: Secondary | ICD-10-CM | POA: Diagnosis not present

## 2023-11-14 DIAGNOSIS — E1122 Type 2 diabetes mellitus with diabetic chronic kidney disease: Secondary | ICD-10-CM | POA: Diagnosis not present

## 2023-11-14 DIAGNOSIS — I5032 Chronic diastolic (congestive) heart failure: Secondary | ICD-10-CM

## 2023-11-14 DIAGNOSIS — N1832 Chronic kidney disease, stage 3b: Secondary | ICD-10-CM | POA: Diagnosis not present

## 2023-11-14 NOTE — Telephone Encounter (Signed)
   Pre-operative Risk Assessment    Patient Name: [name]  DOB: 17-Oct-1965 MRN: [id number]   Date of last office visit: 11/14/2023 Date of next office visit: 6 month    Request for Surgical Clearance    Procedure:  Panniculectomy  Date of Surgery:  Clearance TBD                                 Surgeon:  Lisanro Montorfano, MD Surgeon's Group or Practice Name:  [facility] Phone number:  [phone] Fax number:  [phone]   Type of Clearance Requested:   - Medical    Type of Anesthesia:  General    Additional requests/questions:  N/A  SignedUla Hummer   11/14/2023, 9:50 AM

## 2023-11-14 NOTE — Patient Instructions (Signed)
 Medication Instructions:  HOLD OZEMPIC  PRIOR TO PROCEDURE *If you need a refill on your cardiac medications before your next appointment, please call your pharmacy*  Lab Work: NONE ORDERED If you have labs (blood work) drawn today and your tests are completely normal, you will receive your results only by: MyChart Message (if you have MyChart) OR A paper copy in the mail If you have any lab test that is abnormal or we need to change your treatment, we will call you to review the results.  Testing/Procedures: NONE ORDERED  Follow-Up: At [facility], you and your health needs are our priority.  As part of our continuing mission to provide you with exceptional heart care, our providers are all part of one team.  This team includes your primary Cardiologist (physician) and Advanced Practice Providers or APPs (Physician Assistants and Nurse Practitioners) who all work together to provide you with the care you need, when you need it.  Your next appointment:   6 month(s)  Provider:   Gordy Bergamo, MD  We recommend signing up for the patient portal called MyChart.  Sign up information is provided on this After Visit Summary.  MyChart is used to connect with patients for Virtual Visits (Telemedicine).  Patients are able to view lab/test results, encounter notes, upcoming appointments, etc.  Non-urgent messages can be sent to your provider as well.   To learn more about what you can do with MyChart, go to [email or web address].   Other Instructions You are cleared for your procedure. We will request cardiac clearance request from your provider to send over clearance letter.

## 2023-11-15 ENCOUNTER — Telehealth: Payer: Self-pay

## 2023-11-15 NOTE — Telephone Encounter (Signed)
 Contacted the patient to see if she would like to have her implant removed. Patient states she is going to think about it and get back to us . Patient voiced understanding.

## 2023-11-15 NOTE — Telephone Encounter (Signed)
-----   Message from Wyn Raddle, Jackee Shove sent at 11/15/2023  7:50 AM EDT ----- Good morning,  Please contact Ms. [name] and let her know that I reached out to Dr. Ladona who recommended that we could refer her to EP for removal or if she wishes she could leave it implanted and it would not cause any harm.  If she wishes to proceed with the EP we can go ahead and refer her.  Thanks, Jackee ----- Message ----- From: Ladona Heinz, MD Sent: 11/14/2023   6:33 PM EDT To: Jackee VEAR Wyn Raddle., NP  We can is she wants to please refer to EP. Otherwise best just left alone ----- Message ----- From: Wyn Jackee VEAR Raddle., NP Sent: 11/14/2023  10:53 AM EDT To: Heinz Ladona, MD  Good morning,  [name] was seen today for preop clearance visit and was doing well.  She was interested in having her loop recorder removed and I was reaching out on her behalf to see if this could be done.  Thanks, Jackee Wyn, NP

## 2023-11-20 DIAGNOSIS — E039 Hypothyroidism, unspecified: Secondary | ICD-10-CM | POA: Diagnosis not present

## 2023-11-20 DIAGNOSIS — N184 Chronic kidney disease, stage 4 (severe): Secondary | ICD-10-CM | POA: Diagnosis not present

## 2023-11-20 DIAGNOSIS — I129 Hypertensive chronic kidney disease with stage 1 through stage 4 chronic kidney disease, or unspecified chronic kidney disease: Secondary | ICD-10-CM | POA: Diagnosis not present

## 2023-11-20 DIAGNOSIS — J45909 Unspecified asthma, uncomplicated: Secondary | ICD-10-CM | POA: Diagnosis not present

## 2023-11-20 DIAGNOSIS — Z01818 Encounter for other preprocedural examination: Secondary | ICD-10-CM | POA: Diagnosis not present

## 2023-11-20 DIAGNOSIS — E1122 Type 2 diabetes mellitus with diabetic chronic kidney disease: Secondary | ICD-10-CM | POA: Diagnosis not present

## 2023-11-20 DIAGNOSIS — E7800 Pure hypercholesterolemia, unspecified: Secondary | ICD-10-CM | POA: Diagnosis not present

## 2023-11-22 DIAGNOSIS — M2021 Hallux rigidus, right foot: Secondary | ICD-10-CM | POA: Diagnosis not present

## 2023-11-22 DIAGNOSIS — I5032 Chronic diastolic (congestive) heart failure: Secondary | ICD-10-CM | POA: Diagnosis not present

## 2023-11-22 DIAGNOSIS — R531 Weakness: Secondary | ICD-10-CM | POA: Diagnosis not present

## 2023-11-22 DIAGNOSIS — R2689 Other abnormalities of gait and mobility: Secondary | ICD-10-CM | POA: Diagnosis not present

## 2023-11-22 DIAGNOSIS — M21611 Bunion of right foot: Secondary | ICD-10-CM | POA: Diagnosis not present

## 2023-11-24 ENCOUNTER — Telehealth: Payer: Self-pay

## 2023-11-24 NOTE — Telephone Encounter (Signed)
 Patient is ready to schedule surgery when we are

## 2023-11-26 ENCOUNTER — Other Ambulatory Visit: Payer: Self-pay

## 2023-11-26 ENCOUNTER — Emergency Department (HOSPITAL_COMMUNITY)

## 2023-11-26 ENCOUNTER — Emergency Department (HOSPITAL_COMMUNITY)
Admission: EM | Admit: 2023-11-26 | Discharge: 2023-11-26 | Disposition: A | Discharge status: Home / Self Care | Attending: Emergency Medicine | Admitting: Emergency Medicine

## 2023-11-26 ENCOUNTER — Encounter (HOSPITAL_COMMUNITY): Payer: Self-pay | Admitting: *Deleted

## 2023-11-26 DIAGNOSIS — R0602 Shortness of breath: Secondary | ICD-10-CM | POA: Diagnosis not present

## 2023-11-26 DIAGNOSIS — I130 Hypertensive heart and chronic kidney disease with heart failure and stage 1 through stage 4 chronic kidney disease, or unspecified chronic kidney disease: Secondary | ICD-10-CM | POA: Diagnosis not present

## 2023-11-26 DIAGNOSIS — J45909 Unspecified asthma, uncomplicated: Secondary | ICD-10-CM | POA: Diagnosis not present

## 2023-11-26 DIAGNOSIS — Z7982 Long term (current) use of aspirin: Secondary | ICD-10-CM | POA: Diagnosis not present

## 2023-11-26 DIAGNOSIS — U071 COVID-19: Secondary | ICD-10-CM | POA: Insufficient documentation

## 2023-11-26 DIAGNOSIS — R062 Wheezing: Secondary | ICD-10-CM | POA: Diagnosis not present

## 2023-11-26 DIAGNOSIS — I5032 Chronic diastolic (congestive) heart failure: Secondary | ICD-10-CM | POA: Insufficient documentation

## 2023-11-26 DIAGNOSIS — R509 Fever, unspecified: Secondary | ICD-10-CM | POA: Diagnosis not present

## 2023-11-26 DIAGNOSIS — J4550 Severe persistent asthma, uncomplicated: Secondary | ICD-10-CM | POA: Insufficient documentation

## 2023-11-26 DIAGNOSIS — N183 Chronic kidney disease, stage 3 unspecified: Secondary | ICD-10-CM | POA: Diagnosis not present

## 2023-11-26 DIAGNOSIS — E1122 Type 2 diabetes mellitus with diabetic chronic kidney disease: Secondary | ICD-10-CM | POA: Diagnosis not present

## 2023-11-26 DIAGNOSIS — R059 Cough, unspecified: Secondary | ICD-10-CM | POA: Diagnosis present

## 2023-11-26 DIAGNOSIS — Z79899 Other long term (current) drug therapy: Secondary | ICD-10-CM | POA: Diagnosis not present

## 2023-11-26 LAB — RESP PANEL BY RT-PCR (RSV, FLU A&B, COVID)  RVPGX2
Influenza A by PCR: NEGATIVE
Influenza B by PCR: NEGATIVE
Resp Syncytial Virus by PCR: NEGATIVE
SARS Coronavirus 2 by RT PCR: POSITIVE — AB

## 2023-11-26 NOTE — ED Triage Notes (Signed)
 Pt BIB [facility] for SOB, cold symptoms since Thursday. Hx of asthma. Neb tx given en route, pt states it has helped some. Fever 101 on Thursday. Denies any sick contacts.

## 2023-11-26 NOTE — Discharge Instructions (Signed)
 We evaluated you for your cough and congestion.  Your COVID test was positive.  Please be sure to drink lots of fluids.  Please follow-up closely with your primary doctor.  Please take Tylenol  every 6 hours as needed for fevers.   Please return if you have any worsening symptoms.

## 2023-11-26 NOTE — ED Provider Notes (Signed)
 [place] EMERGENCY DEPARTMENT AT [facility] Provider Note  CSN: [id number] Arrival date & time: 11/26/23 1313  Chief Complaint(s) Shortness of Breath  HPI [name] is a 58 y.o. female history of CHF, asthma, diabetes, hypertension, lipidemia presenting with cough.  Patient reports cough since Thursday.  Reports associated congestion, runny nose, sore throat.  Has had occasional fevers and taken Tylenol  which helps.  She reports overall she is feeling better.  She reports some increased shortness of breath today so she called the paramedics.  She received a breathing treatment and feels better.  No chest pain, back pain, abdominal pain.  Nausea, vomiting and diarrhea   Past Medical History Past Medical History:  Diagnosis Date   Acute asthma exacerbation 12/21/2014   Acute renal failure    ARF (acute renal failure) 05/03/2014   Asthma    Asthma exacerbation 05/03/2014   Asthma, severe persistent 05/03/2014   Chronic diastolic CHF (congestive heart failure) (HCC) 06/21/2018   Diabetes mellitus    Diabetes mellitus type 2 in obese 05/03/2014   History of echocardiogram 04/2014   LVH, diastolic dysfunction   Hyperlipidemia    Hypertension    Hyponatremia 12/02/2014   Obesity    Preop examination 05/18/2017   SOB (shortness of breath) 12/22/2014   Patient Active Problem List   Diagnosis Date Noted   Loop recorder Insertion of Lux-Dx Water quality scientist. Serial # [id number] for Lux DX heart failure trial 09/29/2021 09/29/2021   Chronic diastolic CHF (congestive heart failure) (HCC) 06/21/2018   Obesity 05/01/2018   Special screening for malignant neoplasms, colon    SOB (shortness of breath) 12/22/2014   Hyponatremia 12/02/2014   CKD (chronic kidney disease) stage 3, GFR 30-59 ml/min (HCC) 05/04/2014   Asthma exacerbation 05/03/2014   Type 2 diabetes mellitus with obesity 05/03/2014   Hypertension 05/03/2014   Hyperlipidemia 05/03/2014   Home  Medication(s) Prior to Admission medications   Medication Sig Start Date End Date Taking? Authorizing Provider  ACCU-CHEK GUIDE TEST test strip 2 (two) times daily. 09/26/23   [provider]  acetaminophen  (TYLENOL ) 500 MG tablet Take 500 mg by mouth every 6 (six) hours as needed for moderate pain.    [provider]  albuterol  (PROVENTIL  HFA;VENTOLIN  HFA) 108 (90 BASE) MCG/ACT inhaler Inhale 2 puffs into the lungs every 6 (six) hours as needed for wheezing.    [provider]  albuterol  (PROVENTIL ) (2.5 MG/3ML) 0.083% nebulizer solution Take 3 mLs (2.5 mg total) by nebulization every 6 (six) hours as needed for wheezing or shortness of breath. 12/21/14   Joy, Shawn C, PA-C  amLODipine  (NORVASC ) 5 MG tablet TAKE ONE TABLET BY MOUTH ONCE DAILY. 12/17/18   Ladona Heinz, MD  aspirin  81 MG chewable tablet Chew 81 mg by mouth daily. 05/16/23   [provider]  atorvastatin  (LIPITOR) 40 MG tablet Take 40 mg by mouth daily.    [provider]  calcitRIOL (ROCALTROL) 0.25 MCG capsule Take 0.25 mcg by mouth daily.    [provider]  Calcium  Carbonate-Vitamin D  (CALCIUM  600+D PO) Take 1 tablet by mouth daily.    [provider]  carboxymethylcellulose (REFRESH PLUS) 0.5 % SOLN Place 1 drop into both eyes 3 (three) times daily as needed (dry eyes).    [provider]  carvedilol  (COREG ) 6.25 MG tablet Take 6.25 mg by mouth 2 (two) times daily.  11/08/13   [provider]  Cholecalciferol (VITAMIN D ) 2000 units tablet Take 2,000  Units by mouth daily.     [provider]  FARXIGA 5 MG TABS tablet Take 5 mg by mouth daily.    [provider]  FIBER ADULT GUMMIES PO Take 3 capsules by mouth daily.    [provider]  gabapentin  (NEURONTIN ) 300 MG capsule Take 1 capsule (300 mg total) by mouth 4 (four) times daily. 05/13/19   Ladona Heinz, MD  HUMALOG  100 UNIT/ML injection Uses in insulin  pump 07/19/21   [provider]  hydrALAZINE  (APRESOLINE ) 25 MG tablet TAKE ONE TABLET BY MOUTH 3 TIMES A DAY 09/22/23   Tolia, Sunit, DO  Insulin  Human (INSULIN  PUMP) SOLN Inject into the skin.    [provider]  levothyroxine  (SYNTHROID , LEVOTHROID) 75 MCG tablet Take 75 mcg by mouth daily before breakfast.    [provider]  Lifitegrast (XIIDRA) 5 % SOLN Place 1 drop into both eyes daily as needed (itchy eyes).    [provider]  montelukast  (SINGULAIR ) 10 MG tablet Take 10 mg by mouth daily.     [provider]  Multiple Vitamins-Minerals (ADULT GUMMY PO) Take 1 capsule by mouth daily.    [provider]  nystatin  (MYCOSTATIN /NYSTOP ) powder Apply 1 Application topically 3 (three) times daily. 11/21/22   Jayne Vonn DEL, MD  Semaglutide , 2 MG/DOSE, (OZEMPIC , 2 MG/DOSE,) 8 MG/3ML SOPN INJECT 2MG  INTO THE SKIN ONCE A WEEK 03/09/23   Ladona Heinz, MD  torsemide  (DEMADEX ) 20 MG tablet Take 40 mg by mouth daily. Change to daily from BID. 06/30/22    [provider]                                                                                                                                    Past Surgical History Past Surgical History:  Procedure Laterality Date   CARDIAC CATHETERIZATION  03/2013   normal coronary arteries, EF 55%   CARDIAC CATHETERIZATION  03/2013   normal coronary arteries   CATARACT EXTRACTION W/PHACO Right 12/10/2012   Procedure: CATARACT EXTRACTION PHACO AND INTRAOCULAR LENS PLACEMENT (IOC);  Surgeon: Cherene Mania, MD;  Location: [facility];  Service: Ophthalmology;  Laterality: Right;  CDE:22..42   CATARACT EXTRACTION W/PHACO Left 05/11/2015   Procedure: CATARACT EXTRACTION PHACO AND INTRAOCULAR LENS PLACEMENT LEFT EYE CDE=6.54;  Surgeon: Cherene Mania, MD;  Location: [facility];  Service: Ophthalmology;  Laterality: Left;   COLONOSCOPY N/A 09/22/2017   Procedure: COLONOSCOPY;  Surgeon: Harvey Margo CROME, MD;  Location: [facility];  Service:  Endoscopy;  Laterality: N/A;  12:15   COLONOSCOPY WITH PROPOFOL  N/A 09/27/2021   Procedure: COLONOSCOPY WITH PROPOFOL ;  Surgeon: Cindie Carlin POUR, DO;  Location: [facility];  Service: Endoscopy;  Laterality: N/A;  8:15am   EYE SURGERY Left    GASTRIC BYPASS  05/01/2018   GASTRIC ROUX-EN-Y N/A 05/01/2018   Procedure: LAPAROSCOPIC ROUX-EN-Y GASTRIC BYPASS WITH UPPER ENDOSCOPY WITH ERAS PATHWAY;  Surgeon:  Kinsinger, Herlene Righter, MD;  Location: [facility];  Service: General;  Laterality: N/A;   LEFT HEART CATHETERIZATION WITH CORONARY ANGIOGRAM N/A 04/09/2013   Procedure: LEFT HEART CATHETERIZATION WITH CORONARY ANGIOGRAM;  Surgeon: Erick JONELLE Bergamo, MD;  Location: [facility] CATH LAB;  Service: Cardiovascular;  Laterality: N/A;   Loop recorder implantation     Insertion of Lux-Dx Water quality scientist. Serial # [id number] 09/29/21 for heart failure research   TRACHEOSTOMY     at age 75 from asthma attack   Family History Family History  Problem Relation Age of Onset   Diabetes Mother    Asthma Mother    Bronchitis Mother    Colon polyps Father    Hypertension Sister    Asthma Other    Hypertension Other    Colon cancer Neg Hx     Social History Social History   Tobacco Use   Smoking status: Never   Smokeless tobacco: Never  Vaping Use   Vaping status: Never Used  Substance Use Topics   Alcohol use: Yes    Comment: occasionally   Drug use: No   Allergies Patient has no known allergies.  Review of Systems Review of Systems  All other systems reviewed and are negative.   Physical Exam Vital Signs  I have reviewed the triage vital signs BP (!) 199/66   Pulse (!) 52   Temp 98.2 F (36.8 C)   Resp 20   Ht 5' 0.5 (1.537 m)   Wt 93 kg   LMP 07/29/2013   SpO2 100%   BMI 39.38 kg/m  Physical Exam Vitals and nursing note reviewed.  Constitutional:      General: She is not in acute distress.    Appearance: She is well-developed. She is obese.  HENT:     Head:  Normocephalic and atraumatic.     Mouth/Throat:     Mouth: Mucous membranes are moist.  Eyes:     Pupils: Pupils are equal, round, and reactive to light.  Cardiovascular:     Rate and Rhythm: Normal rate and regular rhythm.     Heart sounds: No murmur heard. Pulmonary:     Effort: Pulmonary effort is normal. No respiratory distress.     Breath sounds: Normal breath sounds.  Abdominal:     General: Abdomen is flat.     Palpations: Abdomen is soft.     Tenderness: There is no abdominal tenderness.  Musculoskeletal:        General: No tenderness.     Right lower leg: No edema.     Left lower leg: No edema.  Skin:    General: Skin is warm and dry.  Neurological:     General: No focal deficit present.     Mental Status: She is alert. Mental status is at baseline.  Psychiatric:        Mood and Affect: Mood normal.        Behavior: Behavior normal.     ED Results and Treatments Labs (all labs ordered are listed, but only abnormal results are displayed) Labs Reviewed  RESP PANEL BY RT-PCR (RSV, FLU A&B, COVID)  RVPGX2 - Abnormal; Notable for the following components:      Result Value   SARS Coronavirus 2 by RT PCR POSITIVE (*)    All other components within normal limits  Radiology DG Chest 2 View Result Date: 11/26/2023 EXAM: 2 VIEW(S) XRAY OF THE CHEST 11/26/2023 01:44:41 PM COMPARISON: 05/10/17. CLINICAL HISTORY: sob. Per Triage: Pt BIB [facility] for SOB, cold symptoms since Thursday. Hx of asthma. Neb tx given en route, pt states it has helped some. Fever 101 on Thursday. Denies any sick contacts. FINDINGS: LINES, TUBES AND DEVICES: Loop recorder in left chest. LUNGS AND PLEURA: No focal pulmonary opacity. No pulmonary edema. No pleural effusion. No pneumothorax. HEART AND MEDIASTINUM: No acute abnormality of the cardiac and mediastinal silhouettes. BONES AND SOFT  TISSUES: Thoracic degenerative changes. IMPRESSION: 1. Normal chest radiograph. No acute cardiopulmonary process detected. Electronically signed by: Waddell Calk MD 11/26/2023 02:10 PM EDT RP Workstation: [id number]    Pertinent labs & imaging results that were available during my care of the patient were reviewed by me and considered in my medical decision making (see MDM for details).  Medications Ordered in ED Medications - No data to display                                                                                                                                   Procedures Procedures  (including critical care time)  Medical Decision Making / ED Course   MDM:  58 year old presenting to the emergency department shortness of breath.  Symptoms have resolved in the emergency department.  Vital signs reassuring.  No hypoxia, lungs are clear on exam. Symptoms seem most consistent with COVID infection.  Her COVID test is positive.  She overall appears quite well and has no focal exam findings suggesting need for further testing.  Chest x-ray is negative for pneumonia.  She does have a history of asthma may have had some wheezing from this but currently lungs are clear.  She is treatment for this at home.  I do not feel like the patient needs any further testing in the emergency department believe she is stable for discharge to home. Will discharge patient to home. All questions answered. Patient comfortable with plan of discharge. Return precautions discussed with patient and specified on the after visit summary.       Additional history obtained: -Additional history obtained from ems -External records from outside source obtained and reviewed including: Chart review including previous notes, labs, imaging, consultation notes including prior notes    Lab Tests: -I ordered, reviewed, and interpreted labs.   The pertinent results include:   Labs Reviewed  RESP PANEL BY RT-PCR  (RSV, FLU A&B, COVID)  RVPGX2 - Abnormal; Notable for the following components:      Result Value   SARS Coronavirus 2 by RT PCR POSITIVE (*)    All other components within normal limits    Notable for + COVID    Imaging Studies ordered: I ordered imaging studies including CXR On my interpretation imaging demonstrates no acute proces I independently visualized and interpreted imaging. I agree with the radiologist interpretation  Medicines ordered and prescription drug management: No orders of the defined types were placed in this encounter.   -I have reviewed the patients home medicines and have made adjustments as needed  Social Determinants of Health:  Diagnosis or treatment significantly limited by social determinants of health: obesity   Reevaluation: After the interventions noted above, I reevaluated the patient and found that their symptoms have improved  Co morbidities that complicate the patient evaluation  Past Medical History:  Diagnosis Date   Acute asthma exacerbation 12/21/2014   Acute renal failure    ARF (acute renal failure) 05/03/2014   Asthma    Asthma exacerbation 05/03/2014   Asthma, severe persistent 05/03/2014   Chronic diastolic CHF (congestive heart failure) (HCC) 06/21/2018   Diabetes mellitus    Diabetes mellitus type 2 in obese 05/03/2014   History of echocardiogram 04/2014   LVH, diastolic dysfunction   Hyperlipidemia    Hypertension    Hyponatremia 12/02/2014   Obesity    Preop examination 05/18/2017   SOB (shortness of breath) 12/22/2014      Dispostion: Disposition decision including need for hospitalization was considered, and patient discharged from emergency department.    Final Clinical Impression(s) / ED Diagnoses Final diagnoses:  COVID-19     This chart was dictated using voice recognition software.  Despite best efforts to proofread,  errors can occur which can change the documentation meaning.    Francesca Elsie CROME,  MD 11/26/23 [phone]

## 2023-12-15 ENCOUNTER — Other Ambulatory Visit (HOSPITAL_COMMUNITY): Payer: Self-pay | Admitting: Family Medicine

## 2023-12-15 DIAGNOSIS — Z1231 Encounter for screening mammogram for malignant neoplasm of breast: Secondary | ICD-10-CM

## 2023-12-18 ENCOUNTER — Encounter: Attending: Family Medicine | Admitting: Skilled Nursing Facility1

## 2023-12-18 ENCOUNTER — Encounter: Payer: Self-pay | Admitting: Skilled Nursing Facility1

## 2023-12-18 DIAGNOSIS — E66813 Obesity, class 3: Secondary | ICD-10-CM | POA: Insufficient documentation

## 2023-12-18 DIAGNOSIS — N1832 Chronic kidney disease, stage 3b: Secondary | ICD-10-CM | POA: Insufficient documentation

## 2023-12-18 DIAGNOSIS — Z6841 Body Mass Index (BMI) 40.0 and over, adult: Secondary | ICD-10-CM | POA: Diagnosis present

## 2023-12-18 DIAGNOSIS — E119 Type 2 diabetes mellitus without complications: Secondary | ICD-10-CM | POA: Diagnosis present

## 2023-12-18 NOTE — Progress Notes (Addendum)
 Medical Nutrition Therapy  Time 11:36: end time 12:06   Pt states she recently had Covid. Pt states she is getting a panniculectomy in December whci she is very excited about.    Bariatric Surgery Type: RYGB     Surgery Date: 05/01/2018   NUTRITION ASSESSMENT  CGM Results from download: 06/09/2022 09/12/2022 01/19/23 03/29/23 05/05/23 09/21/23  % Time CGM active:    %   (Goal >70%)       Average glucose:   102 fh/iOk85 days 111 x14  104X14  111x14 129x14  Glucose management indicator:    % 6%    6.4  Time in range (70-180 mg/dL):   93 %   (Goal >29%) 92 88 72% 84 84  Time High (181-250 mg/dL):   2 %   (Goal < 74%) 5 3 5% 7 12  Time Very High (>250 mg/dL):    0 %   (Goal < 5%) 1 <1 1% 2 2  Time Low (54-69 mg/dL):   4 %   (Goal <5%) 1 7 17% 7 2  Time Very Low (<54 mg/dL):   1 %   (Goal <8%) 1 1 5% 1 0   She is on a Medtronic insulin  pump - does not carbohydrate count.  Uses a set insulin  dose for each meal. Other DM medication Farxiga CGM:  Dexcom G7  Anthropometrics  364 lbs highest adult weight Start weight at [facility]: 351.1 lbs  219 pounds 03/29/2023 225 lbs 05/05/2023 215 lbs 09/21/2023 202 12/18/2023  Body Composition Scale 09/22/2020 10/26/2021 08/04/2022 10/05/2022  Weight 265.5 258.6 250.3 234.6  BMI 51 49.2 48.8 47.3  Total Body Fat % 49.6 22 22 20      Visceral Fat 23 50.7 51.1 52.6  Fat-Free Mass % 50.3 39.8 40 40.8     Total Body Water  % 39.6 27.8 27.4 27.4     Muscle-Mass lbs 28 49.6 48.7 45  Body Fat Displacement             Torso  lbs 81.7 78.9 75.8 68.8         Left Leg  lbs 16.3 15.7 15.1 13.7         Right Leg  lbs 16.3 15.7 15.1 13.7         Left Arm  lbs 8.1 7.8 7.5 6.8         Right Arm   lbs 8.1 7.8 7.5 6.8   Medical dx:  CKD stage 3, DM type 2, CHF, HTN, HLD, s/p RYGB (05/01/2018) Labs: WNL (no access to renal panel) Medications: Humalog  insulin  pump, ozempic , farxiga Insulin  pump:  Medtronic 770G - basal insulin  only ut only if blood sugars ar high according  to patient  24-Hr Dietary Recall First Meal: grits + egg + bacon or egg + bread + sausage Snack:  dry cereal or trailmix or cashews  Second Meal: chicken and black eyed peas or restaurant salad or chic fila chicken nuggets or half a sandwich Snack: sherbert  Third Meal: baked chicken, mashed potatoes, packaged gravy or half a sandwich and grapes Snack:  cherries, 1/2 cup ice cream or tortilla chips + dip Beverages:  alkaline water , coffee with cream and splenda, diet cranberry juice, sugar free lemonade, zero soda (ging[place]ale), unsweetened home brewed tea with splenda, diet mountain dew   Estimated Daily Fluid Intake: 64+ ounces  Estimated Daily Protein Intake:  60 g Supplements: vitamin D , calcium   Current average weekly physical activity: walking, wants to use her silver  sneakers for weights and the elliptical when she has a gym buddy must mostly walking the outside of her house but only if she has a walking buddy    Signs/Symptoms  Using straws: no Drinking while eating: no Chewing/swallowing difficulties: no Changes in vision: no Changes to mood/headaches: no Hair loss/changes to skin/nails: no Difficulty focusing/concentrating: no Sweating: no Dizziness/lightheadedness: no Palpitations: no Carbonated/caffeinated beverages: yes N/V/D/C/Gas: having a bowel movement every other day Abdominal pain: no Dumping syndrome: no   NUTRITION DIAGNOSIS  Overweight/obesity ([place]-3.3) related to past poor dietary habits and physical inactivity as evidenced by patient w/ completed RYGB surgery following dietary guidelines for continued weight loss and healthy nutrition status within the context of CKD and DM   NUTRITION INTERVENTION: continued Nutrition counseling (C-1) and education (E-2) to facilitate bariatric surgery goals, including: The importance of consuming adequate calories as well as certain nutrients daily due to the body's need for essential vitamins, minerals, and fats The  importance of daily physical activity and to reach a goal of at least 150 minutes of moderate to vigorous physical activity weekly (or as directed by their physician) due to benefits such as increased musculature and improved lab values Encouraged patient to honor their body's internal hunger and fullness cues.  Throughout the day, check in mentally and rate hunger. Stop eating when satisfied not full regardless of how much food is left on the plate.  Get more if still hungry 20-30 minutes later.  The key is to honor satisfaction so throughout the meal, rate fullness factor and stop when comfortably satisfied not physically full. The key is to honor hunger and fullness without any feelings of guilt or shame.  Pay attention to what the internal cues are, rather than any external factors. This will enhance the confidence you have in listening to your own body and following those internal cues enabling you to increase how often you eat when you are hungry not out of appetite and stop when you are satisfied not full.  Encouraged pt to continue to eat balanced meals inclusive of non starchy vegetables 2 times a day 7 days a week Encouraged pt to choose lean protein sources: limiting beef, pork, sausage, hotdogs, and lunch meat Encourage pt to choose healthy fats such as plant based limiting animal fats Encouraged pt to continue to drink a minium 64 fluid ounces with half being plain water  to satisfy proper hydration  Educated on hypoglycemia correction  Hyperphosphoremia and its consequences  Discussed contacting her provider if she continues to have lows to get her insulin  pump settings changed. Previous: reviewed renal guidelines - low sodium, low phos Reviewed that patient needs real juice for a low not diet juice  Instructed proper use of glucagon.  Handouts Previously Provided Include  Meal ideas with appropriate portion size Mindful meals check list Eat this not that phosphorus list of foods   Carbohydrate list of foods Bariatric Vitamin chart  Goals: Get the multivitamin from the pharmacy downstairs  Readiness for Change: contemplating  Demonstrated degree of understanding via: Teach Back     MONITORING & EVALUATION Dietary intake, weekly physical activity, and body weight follow up with Leita in 5 months

## 2023-12-22 DIAGNOSIS — I5032 Chronic diastolic (congestive) heart failure: Secondary | ICD-10-CM | POA: Diagnosis not present

## 2023-12-22 DIAGNOSIS — M21611 Bunion of right foot: Secondary | ICD-10-CM | POA: Diagnosis not present

## 2023-12-22 DIAGNOSIS — R531 Weakness: Secondary | ICD-10-CM | POA: Diagnosis not present

## 2023-12-22 DIAGNOSIS — M2021 Hallux rigidus, right foot: Secondary | ICD-10-CM | POA: Diagnosis not present

## 2023-12-22 DIAGNOSIS — R2689 Other abnormalities of gait and mobility: Secondary | ICD-10-CM | POA: Diagnosis not present

## 2023-12-28 DIAGNOSIS — E1165 Type 2 diabetes mellitus with hyperglycemia: Secondary | ICD-10-CM | POA: Diagnosis not present

## 2023-12-28 DIAGNOSIS — E1169 Type 2 diabetes mellitus with other specified complication: Secondary | ICD-10-CM | POA: Diagnosis not present

## 2024-01-08 ENCOUNTER — Telehealth: Payer: Self-pay

## 2024-01-08 NOTE — Telephone Encounter (Signed)
 Panniculectomy / Ins / Sx 01-29-24 [facility] 1330   Preop 1-14  Pease check surgery clearances

## 2024-01-09 ENCOUNTER — Encounter: Admitting: Surgical

## 2024-01-12 ENCOUNTER — Ambulatory Visit

## 2024-01-12 VITALS — BP 150/83 | HR 87 | Ht 60.5 in | Wt 210.0 lb

## 2024-01-12 DIAGNOSIS — M793 Panniculitis, unspecified: Secondary | ICD-10-CM

## 2024-01-12 DIAGNOSIS — E65 Localized adiposity: Secondary | ICD-10-CM | POA: Diagnosis not present

## 2024-01-12 DIAGNOSIS — R21 Rash and other nonspecific skin eruption: Secondary | ICD-10-CM

## 2024-01-12 DIAGNOSIS — Z01818 Encounter for other preprocedural examination: Secondary | ICD-10-CM

## 2024-01-12 MED ORDER — ONDANSETRON 4 MG PO TBDP: 4.0000 mg | ORAL_TABLET | Freq: Three times a day (TID) | ORAL | 0 refills | Status: AC | PRN

## 2024-01-12 MED ORDER — ACETAMINOPHEN 500 MG PO TABS
500.0000 mg | ORAL_TABLET | Freq: Four times a day (QID) | ORAL | 0 refills | Status: AC | PRN
Start: 2024-01-12 — End: ?

## 2024-01-12 MED ORDER — OXYCODONE HCL 5 MG PO TABS: 5.0000 mg | ORAL_TABLET | ORAL | 0 refills | Status: AC | PRN

## 2024-01-12 NOTE — H&P (View-Only) (Signed)
 Patient ID: [name], female    DOB: 04/05/1965, 58 y.o.   MRN: [id number]  Chief Complaint  Patient presents with   Pre-op Exam    No diagnosis found.   History of Present Illness: [name] is a 58 y.o.  female  with a history of HF (EF 55-60%), HTN, Type II DM (Last A1c 5.5) CKD Stage III (stable).  She presents for preoperative evaluation for upcoming procedure, panniculectomy, scheduled for 12/1 with Dr. Lavone Weisel.  The patient has not had problems with anesthesia.   Clearances: Patient has been cleared by cardiologist and PCP to proceed with surgery.   Job: Retired  [employer] Significant for:  Past Medical History:  Diagnosis Date   Acute asthma exacerbation 12/21/2014   Acute renal failure    ARF (acute renal failure) 05/03/2014   Asthma    Asthma exacerbation 05/03/2014   Asthma, severe persistent (HCC) 05/03/2014   Chronic diastolic CHF (congestive heart failure) (HCC) 06/21/2018   Diabetes mellitus    Diabetes mellitus type 2 in obese 05/03/2014   History of echocardiogram 04/2014   LVH, diastolic dysfunction   Hyperlipidemia    Hypertension    Hyponatremia 12/02/2014   Obesity    Preop examination 05/18/2017   SOB (shortness of breath) 12/22/2014   Past Medical History: Allergies: No Known Allergies  Current Medications:  Current Outpatient Medications:    ACCU-CHEK GUIDE TEST test strip, 2 (two) times daily., Disp: , Rfl:    acetaminophen  (TYLENOL ) 500 MG tablet, Take 500 mg by mouth every 6 (six) hours as needed for moderate pain., Disp: , Rfl:    albuterol  (PROVENTIL  HFA;VENTOLIN  HFA) 108 (90 BASE) MCG/ACT inhaler, Inhale 2 puffs into the lungs every 6 (six) hours as needed for wheezing., Disp: , Rfl:    albuterol  (PROVENTIL ) (2.5 MG/3ML) 0.083% nebulizer solution, Take 3 mLs (2.5 mg total) by nebulization every 6 (six) hours as needed for wheezing or shortness of breath., Disp: 75 mL, Rfl: 12   amLODipine  (NORVASC ) 5 MG tablet, TAKE ONE  TABLET BY MOUTH ONCE DAILY., Disp: 90 tablet, Rfl: 0   aspirin  81 MG chewable tablet, Chew 81 mg by mouth daily., Disp: , Rfl:    atorvastatin  (LIPITOR) 40 MG tablet, Take 40 mg by mouth daily., Disp: , Rfl:    calcitRIOL (ROCALTROL) 0.25 MCG capsule, Take 0.25 mcg by mouth daily., Disp: , Rfl:    Calcium  Carbonate-Vitamin D  (CALCIUM  600+D PO), Take 1 tablet by mouth daily., Disp: , Rfl:    carboxymethylcellulose (REFRESH PLUS) 0.5 % SOLN, Place 1 drop into both eyes 3 (three) times daily as needed (dry eyes)., Disp: , Rfl:    carvedilol  (COREG ) 6.25 MG tablet, Take 6.25 mg by mouth 2 (two) times daily. , Disp: , Rfl:    Cholecalciferol (VITAMIN D ) 2000 units tablet, Take 2,000 Units by mouth daily. , Disp: , Rfl:    FARXIGA 5 MG TABS tablet, Take 5 mg by mouth daily., Disp: , Rfl:    FIBER ADULT GUMMIES PO, Take 3 capsules by mouth daily., Disp: , Rfl:    gabapentin  (NEURONTIN ) 300 MG capsule, Take 1 capsule (300 mg total) by mouth 4 (four) times daily., Disp: 120 capsule, Rfl: 2   HUMALOG  100 UNIT/ML injection, Uses in insulin  pump, Disp: , Rfl:    hydrALAZINE  (APRESOLINE ) 25 MG tablet, TAKE ONE TABLET BY MOUTH 3 TIMES A DAY, Disp: 270 tablet, Rfl: 3   Insulin  Human (INSULIN  PUMP) SOLN, Inject into  the skin., Disp: , Rfl:    levothyroxine  (SYNTHROID , LEVOTHROID) 75 MCG tablet, Take 75 mcg by mouth daily before breakfast., Disp: , Rfl:    Lifitegrast (XIIDRA) 5 % SOLN, Place 1 drop into both eyes daily as needed (itchy eyes)., Disp: , Rfl:    montelukast  (SINGULAIR ) 10 MG tablet, Take 10 mg by mouth daily. , Disp: , Rfl:    Multiple Vitamins-Minerals (ADULT GUMMY PO), Take 1 capsule by mouth daily., Disp: , Rfl:    nystatin  (MYCOSTATIN /NYSTOP ) powder, Apply 1 Application topically 3 (three) times daily., Disp: 15 g, Rfl: 11   Semaglutide , 2 MG/DOSE, (OZEMPIC , 2 MG/DOSE,) 8 MG/3ML SOPN, INJECT 2MG  INTO THE SKIN ONCE A WEEK, Disp: 9 mL, Rfl: 3   torsemide  (DEMADEX ) 20 MG tablet, Take 40 mg by  mouth daily. Change to daily from BID. 06/30/22, Disp: , Rfl:   Past Medical Problems: Past Medical History:  Diagnosis Date   Acute asthma exacerbation 12/21/2014   Acute renal failure    ARF (acute renal failure) 05/03/2014   Asthma    Asthma exacerbation 05/03/2014   Asthma, severe persistent (HCC) 05/03/2014   Chronic diastolic CHF (congestive heart failure) (HCC) 06/21/2018   Diabetes mellitus    Diabetes mellitus type 2 in obese 05/03/2014   History of echocardiogram 04/2014   LVH, diastolic dysfunction   Hyperlipidemia    Hypertension    Hyponatremia 12/02/2014   Obesity    Preop examination 05/18/2017   SOB (shortness of breath) 12/22/2014    Past Surgical History: Past Surgical History:  Procedure Laterality Date   CARDIAC CATHETERIZATION  03/2013   normal coronary arteries, EF 55%   CARDIAC CATHETERIZATION  03/2013   normal coronary arteries   CATARACT EXTRACTION W/PHACO Right 12/10/2012   Procedure: CATARACT EXTRACTION PHACO AND INTRAOCULAR LENS PLACEMENT (IOC);  Surgeon: Cherene Mania, MD;  Location: [facility];  Service: Ophthalmology;  Laterality: Right;  CDE:22..42   CATARACT EXTRACTION W/PHACO Left 05/11/2015   Procedure: CATARACT EXTRACTION PHACO AND INTRAOCULAR LENS PLACEMENT LEFT EYE CDE=6.54;  Surgeon: Cherene Mania, MD;  Location: [facility];  Service: Ophthalmology;  Laterality: Left;   COLONOSCOPY N/A 09/22/2017   Procedure: COLONOSCOPY;  Surgeon: Harvey Margo CROME, MD;  Location: [facility];  Service: Endoscopy;  Laterality: N/A;  12:15   COLONOSCOPY WITH PROPOFOL  N/A 09/27/2021   Procedure: COLONOSCOPY WITH PROPOFOL ;  Surgeon: Cindie Carlin POUR, DO;  Location: [facility];  Service: Endoscopy;  Laterality: N/A;  8:15am   EYE SURGERY Left    GASTRIC BYPASS  05/01/2018   GASTRIC ROUX-EN-Y N/A 05/01/2018   Procedure: LAPAROSCOPIC ROUX-EN-Y GASTRIC BYPASS WITH UPPER ENDOSCOPY WITH ERAS PATHWAY;  Surgeon: Kinsinger, Herlene Righter, MD;  Location: [facility];  Service: General;   Laterality: N/A;   LEFT HEART CATHETERIZATION WITH CORONARY ANGIOGRAM N/A 04/09/2013   Procedure: LEFT HEART CATHETERIZATION WITH CORONARY ANGIOGRAM;  Surgeon: Erick JONELLE Bergamo, MD;  Location: [facility] CATH LAB;  Service: Cardiovascular;  Laterality: N/A;   Loop recorder implantation     Insertion of [employer] Water Quality Scientist. Serial # [id number] 09/29/21 for heart failure research   TRACHEOSTOMY     at age 32 from asthma attack    Social History: Social History   Socioeconomic History   Marital status: Single    Spouse name: Not on file   Number of children: 0   Years of education: Not on file   Highest education level: Not on file  Occupational History   Not on file  Tobacco Use   Smoking status: Never   Smokeless tobacco: Never  Vaping Use   Vaping status: Never Used  Substance and Sexual Activity   Alcohol use: Yes    Comment: occasionally   Drug use: No   Sexual activity: Yes    Birth control/protection: None  Other Topics Concern   Not on file  Social History Narrative   Not on file   Social Drivers of Health   Financial Resource Strain: Not on file  Food Insecurity: Low Risk  (05/23/2023)   Received from [facility]   Hunger Vital Sign    Within the past 12 months, you worried that your food would run out before you got money to buy more: Never true    Within the past 12 months, the food you bought just didn't last and you didn't have money to get more. : Never true  Transportation Needs: No Transportation Needs (05/23/2023)   Received from [employer]    In the past 12 months, has lack of reliable transportation kept you from medical appointments, meetings, work or from getting things needed for daily living? : No  Physical Activity: Not on file  Stress: Not on file  Social Connections: Not on file  Intimate Partner Violence: Not on file    Family History: Family History  Problem Relation Age of Onset   Diabetes Mother    Asthma  Mother    Bronchitis Mother    Colon polyps Father    Hypertension Sister    Asthma Other    Hypertension Other    Colon cancer Neg Hx     Review of Systems: ROS negative except as noted in HPI  Physical Exam: Vital Signs BP (!) 150/83 (BP Location: Left Arm, Patient Position: Sitting, Cuff Size: Large)   Pulse 87   Ht 5' 0.5 (1.537 m)   Wt 210 lb (95.3 kg)   LMP 07/29/2013   SpO2 97%   BMI 40.34 kg/m   Physical Exam MA as chaperone Constitutional:      General: Not in acute distress.    Appearance: Normal appearance. Not ill-appearing.  HENT:     Head: Normocephalic and atraumatic.  Eyes:     Pupils: Pupils are equal, round. Cardiovascular:  Hemodynamically normal Pulmonary:     Effort: No respiratory distress or increased work of breathing.  Speaks in full sentences. Abdominal:     General: Abdomen is flat. No distension.   Musculoskeletal: Normal range of motion. Skin:    General: Skin is warm and dry.     Findings: No erythema or rash.  Neurological:     Mental Status: Alert and oriented to person, place, and time.  Psychiatric:        Mood and Affect: Mood normal.        Behavior: Behavior normal.    Assessment/Plan: The patient is scheduled for panniculectomy with Dr. Ajane Novella.  Risks, benefits, and alternatives of procedure discussed, questions answered and consent obtained.    Smoking Status: Denies; Counseling Given? N/A Last Mammogram: to be done 11/19; Results: Pending  Caprini Score: 6; Risk Factors include: Age, BMI > 25, swollen legs, other risk factors and length of planned surgery. Recommendation for mechanical & pharmacological prophylaxis. Encourage early ambulation. I will give her a dose of Lovenox  before surgery.   Pictures obtained: Yes  Post-op Rx sent to pharmacy: Tylenol , Gabapentin , Oxycodone , Zofran   Medications before surgery, discussed with patient and patient provided a paper copy  Patient was provided with the General  Surgical Risk consent document and Pain Medication Agreement prior to their appointment.  They had adequate time to read through the risk consent documents and Pain Medication Agreement. We also discussed them in person together during this preop appointment. All of their questions were answered to their satisfaction.  Recommended calling if they have any further questions.  Risk consent form and Pain Medication Agreement to be scanned into patient's chart.  Photographic consent obtained as well.   Electronically signed by: Bennett Vanscyoc M Monigue Spraggins, MD 01/12/2024 9:47 AM

## 2024-01-12 NOTE — Progress Notes (Signed)
 Patient ID: [name], female    DOB: 04/05/1965, 58 y.o.   MRN: [id number]  Chief Complaint  Patient presents with   Pre-op Exam    No diagnosis found.   History of Present Illness: [name] is a 58 y.o.  female  with a history of HF (EF 55-60%), HTN, Type II DM (Last A1c 5.5) CKD Stage III (stable).  She presents for preoperative evaluation for upcoming procedure, panniculectomy, scheduled for 12/1 with Dr. Lavone Weisel.  The patient has not had problems with anesthesia.   Clearances: Patient has been cleared by cardiologist and PCP to proceed with surgery.   Job: Retired  [employer]IN Significant for:  Past Medical History:  Diagnosis Date   Acute asthma exacerbation 12/21/2014   Acute renal failure    ARF (acute renal failure) 05/03/2014   Asthma    Asthma exacerbation 05/03/2014   Asthma, severe persistent (HCC) 05/03/2014   Chronic diastolic CHF (congestive heart failure) (HCC) 06/21/2018   Diabetes mellitus    Diabetes mellitus type 2 in obese 05/03/2014   History of echocardiogram 04/2014   LVH, diastolic dysfunction   Hyperlipidemia    Hypertension    Hyponatremia 12/02/2014   Obesity    Preop examination 05/18/2017   SOB (shortness of breath) 12/22/2014   Past Medical History: Allergies: No Known Allergies  Current Medications:  Current Outpatient Medications:    ACCU-CHEK GUIDE TEST test strip, 2 (two) times daily., Disp: , Rfl:    acetaminophen  (TYLENOL ) 500 MG tablet, Take 500 mg by mouth every 6 (six) hours as needed for moderate pain., Disp: , Rfl:    albuterol  (PROVENTIL  HFA;VENTOLIN  HFA) 108 (90 BASE) MCG/ACT inhaler, Inhale 2 puffs into the lungs every 6 (six) hours as needed for wheezing., Disp: , Rfl:    albuterol  (PROVENTIL ) (2.5 MG/3ML) 0.083% nebulizer solution, Take 3 mLs (2.5 mg total) by nebulization every 6 (six) hours as needed for wheezing or shortness of breath., Disp: 75 mL, Rfl: 12   amLODipine  (NORVASC ) 5 MG tablet, TAKE ONE  TABLET BY MOUTH ONCE DAILY., Disp: 90 tablet, Rfl: 0   aspirin  81 MG chewable tablet, Chew 81 mg by mouth daily., Disp: , Rfl:    atorvastatin  (LIPITOR) 40 MG tablet, Take 40 mg by mouth daily., Disp: , Rfl:    calcitRIOL (ROCALTROL) 0.25 MCG capsule, Take 0.25 mcg by mouth daily., Disp: , Rfl:    Calcium  Carbonate-Vitamin D  (CALCIUM  600+D PO), Take 1 tablet by mouth daily., Disp: , Rfl:    carboxymethylcellulose (REFRESH PLUS) 0.5 % SOLN, Place 1 drop into both eyes 3 (three) times daily as needed (dry eyes)., Disp: , Rfl:    carvedilol  (COREG ) 6.25 MG tablet, Take 6.25 mg by mouth 2 (two) times daily. , Disp: , Rfl:    Cholecalciferol (VITAMIN D ) 2000 units tablet, Take 2,000 Units by mouth daily. , Disp: , Rfl:    FARXIGA 5 MG TABS tablet, Take 5 mg by mouth daily., Disp: , Rfl:    FIBER ADULT GUMMIES PO, Take 3 capsules by mouth daily., Disp: , Rfl:    gabapentin  (NEURONTIN ) 300 MG capsule, Take 1 capsule (300 mg total) by mouth 4 (four) times daily., Disp: 120 capsule, Rfl: 2   HUMALOG  100 UNIT/ML injection, Uses in insulin  pump, Disp: , Rfl:    hydrALAZINE  (APRESOLINE ) 25 MG tablet, TAKE ONE TABLET BY MOUTH 3 TIMES A DAY, Disp: 270 tablet, Rfl: 3   Insulin  Human (INSULIN  PUMP) SOLN, Inject into  the skin., Disp: , Rfl:    levothyroxine  (SYNTHROID , LEVOTHROID) 75 MCG tablet, Take 75 mcg by mouth daily before breakfast., Disp: , Rfl:    Lifitegrast (XIIDRA) 5 % SOLN, Place 1 drop into both eyes daily as needed (itchy eyes)., Disp: , Rfl:    montelukast  (SINGULAIR ) 10 MG tablet, Take 10 mg by mouth daily. , Disp: , Rfl:    Multiple Vitamins-Minerals (ADULT GUMMY PO), Take 1 capsule by mouth daily., Disp: , Rfl:    nystatin  (MYCOSTATIN /NYSTOP ) powder, Apply 1 Application topically 3 (three) times daily., Disp: 15 g, Rfl: 11   Semaglutide , 2 MG/DOSE, (OZEMPIC , 2 MG/DOSE,) 8 MG/3ML SOPN, INJECT 2MG  INTO THE SKIN ONCE A WEEK, Disp: 9 mL, Rfl: 3   torsemide  (DEMADEX ) 20 MG tablet, Take 40 mg by  mouth daily. Change to daily from BID. 06/30/22, Disp: , Rfl:   Past Medical Problems: Past Medical History:  Diagnosis Date   Acute asthma exacerbation 12/21/2014   Acute renal failure    ARF (acute renal failure) 05/03/2014   Asthma    Asthma exacerbation 05/03/2014   Asthma, severe persistent (HCC) 05/03/2014   Chronic diastolic CHF (congestive heart failure) (HCC) 06/21/2018   Diabetes mellitus    Diabetes mellitus type 2 in obese 05/03/2014   History of echocardiogram 04/2014   LVH, diastolic dysfunction   Hyperlipidemia    Hypertension    Hyponatremia 12/02/2014   Obesity    Preop examination 05/18/2017   SOB (shortness of breath) 12/22/2014    Past Surgical History: Past Surgical History:  Procedure Laterality Date   CARDIAC CATHETERIZATION  03/2013   normal coronary arteries, EF 55%   CARDIAC CATHETERIZATION  03/2013   normal coronary arteries   CATARACT EXTRACTION W/PHACO Right 12/10/2012   Procedure: CATARACT EXTRACTION PHACO AND INTRAOCULAR LENS PLACEMENT (IOC);  Surgeon: Cherene Mania, MD;  Location: [facility];  Service: Ophthalmology;  Laterality: Right;  CDE:22..42   CATARACT EXTRACTION W/PHACO Left 05/11/2015   Procedure: CATARACT EXTRACTION PHACO AND INTRAOCULAR LENS PLACEMENT LEFT EYE CDE=6.54;  Surgeon: Cherene Mania, MD;  Location: [facility];  Service: Ophthalmology;  Laterality: Left;   COLONOSCOPY N/A 09/22/2017   Procedure: COLONOSCOPY;  Surgeon: Harvey Margo CROME, MD;  Location: [facility];  Service: Endoscopy;  Laterality: N/A;  12:15   COLONOSCOPY WITH PROPOFOL  N/A 09/27/2021   Procedure: COLONOSCOPY WITH PROPOFOL ;  Surgeon: Cindie Carlin POUR, DO;  Location: [facility];  Service: Endoscopy;  Laterality: N/A;  8:15am   EYE SURGERY Left    GASTRIC BYPASS  05/01/2018   GASTRIC ROUX-EN-Y N/A 05/01/2018   Procedure: LAPAROSCOPIC ROUX-EN-Y GASTRIC BYPASS WITH UPPER ENDOSCOPY WITH ERAS PATHWAY;  Surgeon: Kinsinger, Herlene Righter, MD;  Location: [facility];  Service: General;   Laterality: N/A;   LEFT HEART CATHETERIZATION WITH CORONARY ANGIOGRAM N/A 04/09/2013   Procedure: LEFT HEART CATHETERIZATION WITH CORONARY ANGIOGRAM;  Surgeon: Erick JONELLE Bergamo, MD;  Location: [facility] CATH LAB;  Service: Cardiovascular;  Laterality: N/A;   Loop recorder implantation     Insertion of [employer] Water Quality Scientist. Serial # [id number] 09/29/21 for heart failure research   TRACHEOSTOMY     at age 32 from asthma attack    Social History: Social History   Socioeconomic History   Marital status: Single    Spouse name: Not on file   Number of children: 0   Years of education: Not on file   Highest education level: Not on file  Occupational History   Not on file  Tobacco Use   Smoking status: Never   Smokeless tobacco: Never  Vaping Use   Vaping status: Never Used  Substance and Sexual Activity   Alcohol use: Yes    Comment: occasionally   Drug use: No   Sexual activity: Yes    Birth control/protection: None  Other Topics Concern   Not on file  Social History Narrative   Not on file   Social Drivers of Health   Financial Resource Strain: Not on file  Food Insecurity: Low Risk  (05/23/2023)   Received from [facility]   Hunger Vital Sign    Within the past 12 months, you worried that your food would run out before you got money to buy more: Never true    Within the past 12 months, the food you bought just didn't last and you didn't have money to get more. : Never true  Transportation Needs: No Transportation Needs (05/23/2023)   Received from [employer]    In the past 12 months, has lack of reliable transportation kept you from medical appointments, meetings, work or from getting things needed for daily living? : No  Physical Activity: Not on file  Stress: Not on file  Social Connections: Not on file  Intimate Partner Violence: Not on file    Family History: Family History  Problem Relation Age of Onset   Diabetes Mother    Asthma  Mother    Bronchitis Mother    Colon polyps Father    Hypertension Sister    Asthma Other    Hypertension Other    Colon cancer Neg Hx     Review of Systems: ROS negative except as noted in HPI  Physical Exam: Vital Signs BP (!) 150/83 (BP Location: Left Arm, Patient Position: Sitting, Cuff Size: Large)   Pulse 87   Ht 5' 0.5 (1.537 m)   Wt 210 lb (95.3 kg)   LMP 07/29/2013   SpO2 97%   BMI 40.34 kg/m   Physical Exam MA as chaperone Constitutional:      General: Not in acute distress.    Appearance: Normal appearance. Not ill-appearing.  HENT:     Head: Normocephalic and atraumatic.  Eyes:     Pupils: Pupils are equal, round. Cardiovascular:  Hemodynamically normal Pulmonary:     Effort: No respiratory distress or increased work of breathing.  Speaks in full sentences. Abdominal:     General: Abdomen is flat. No distension.   Musculoskeletal: Normal range of motion. Skin:    General: Skin is warm and dry.     Findings: No erythema or rash.  Neurological:     Mental Status: Alert and oriented to person, place, and time.  Psychiatric:        Mood and Affect: Mood normal.        Behavior: Behavior normal.    Assessment/Plan: The patient is scheduled for panniculectomy with Dr. Ajane Novella.  Risks, benefits, and alternatives of procedure discussed, questions answered and consent obtained.    Smoking Status: Denies; Counseling Given? N/A Last Mammogram: to be done 11/19; Results: Pending  Caprini Score: 6; Risk Factors include: Age, BMI > 25, swollen legs, other risk factors and length of planned surgery. Recommendation for mechanical & pharmacological prophylaxis. Encourage early ambulation. I will give her a dose of Lovenox  before surgery.   Pictures obtained: Yes  Post-op Rx sent to pharmacy: Tylenol , Gabapentin , Oxycodone , Zofran   Medications before surgery, discussed with patient and patient provided a paper copy  Patient was provided with the General  Surgical Risk consent document and Pain Medication Agreement prior to their appointment.  They had adequate time to read through the risk consent documents and Pain Medication Agreement. We also discussed them in person together during this preop appointment. All of their questions were answered to their satisfaction.  Recommended calling if they have any further questions.  Risk consent form and Pain Medication Agreement to be scanned into patient's chart.  Photographic consent obtained as well.   Electronically signed by: Bennett Vanscyoc M Monigue Spraggins, MD 01/12/2024 9:47 AM

## 2024-01-17 ENCOUNTER — Ambulatory Visit (HOSPITAL_COMMUNITY)
Admission: RE | Admit: 2024-01-17 | Discharge: 2024-01-17 | Disposition: A | Discharge status: Home / Self Care | Source: Ambulatory Visit | Attending: Family Medicine | Admitting: Family Medicine

## 2024-01-17 ENCOUNTER — Encounter (HOSPITAL_COMMUNITY): Payer: Self-pay

## 2024-01-17 DIAGNOSIS — Z1231 Encounter for screening mammogram for malignant neoplasm of breast: Secondary | ICD-10-CM | POA: Insufficient documentation

## 2024-01-18 NOTE — Progress Notes (Signed)
 Surgical preop orders have been requested.

## 2024-01-18 NOTE — Progress Notes (Signed)
 Surgical Instructions   Your procedure is scheduled on Monday, December 1st. Report to [facility] Main Entrance A at 11:30 A.M., then check in with the Admitting office. Any questions or running late day of surgery: call [phone]  Questions prior to your surgery date: call [phone], Monday-Friday, 8am-4pm. If you experience any cold or flu symptoms such as cough, fever, chills, shortness of breath, etc. between now and your scheduled surgery, please notify us  at the above number.     Remember:  Do not eat after midnight the night before your surgery   You may drink clear liquids until 10:30 the morning of your surgery.   Clear liquids allowed are: Water , Non-Citrus Juices (without pulp), Carbonated Beverages, Clear Tea (no milk, honey, etc.), Black Coffee Only (NO MILK, CREAM OR POWDERED CREAMER of any kind), and Gatorade.    Take these medicines the morning of surgery with A SIP OF WATER   amLODipine  (NORVASC )  atorvastatin  (LIPITOR)  carvedilol  (COREG )  gabapentin  (NEURONTIN )  hydrALAZINE  (APRESOLINE )  levothyroxine  (SYNTHROID , LEVOTHROID)   May take these medicines IF NEEDED: acetaminophen  (TYLENOL )  albuterol  inhaler - bring with you on day of sugery  carboxymethylcellulose (REFRESH PLUS) eye drops  ondansetron  (ZOFRAN -ODT)  oxyCODONE  (ROXICODONE )   Per your physician's instruction, HOLD your FARXIGA for 3 day's prior to surgery.  Last dose on Thursday, Nov, 27th.  Follow your surgeon's instructions on when to stop Asprin.  If no instructions were given by your surgeon then you will need to call the office to get those instructions.    One week prior to surgery, STOP taking any Aleve, Naproxen, Ibuprofen , Motrin , Advil , Goody's, BC's, all herbal medications, fish oil, and non-prescription vitamins.  WHAT DO I DO ABOUT MY DIABETES MEDICATION?   Do not take oral diabetes medicines (pills) the morning of surgery.  Per your physician's instruction, HOLD your  Semaglutide  (OZEMPIC ) for 7 day's prior to surgery.  Last dose: Sunday, November 23rd.   For patients with Insulin  Pumps: Contact your PCP or endocrinologist OR decrease basal insulin  rates by 20% at midnight the night before surgery. Make sure to bring insulin  pump supplies to the hospital with you in case your site needs to be changed.   Do NOT Smoke (Tobacco/Vaping) for 24 hours prior to your procedure.  If you use a CPAP at night, you may bring your mask/headgear for your overnight stay.   You will be asked to remove any contacts, glasses, piercing's, hearing aid's, dentures/partials prior to surgery. Please bring cases for these items if needed.    Patients discharged the day of surgery will not be allowed to drive home, and someone needs to stay with them for 24 hours.  SURGICAL WAITING ROOM VISITATION Patients may have no more than 2 support people in the waiting area - these visitors may rotate.   Pre-op nurse will coordinate an appropriate time for 1 ADULT support person, who may not rotate, to accompany patient in pre-op.  Children under the age of 43 must have an adult with them who is not the patient and must remain in the main waiting area with an adult.  If the patient needs to stay at the hospital during part of their recovery, the visitor guidelines for inpatient rooms apply.  Please refer to the [facility] website for the visitor guidelines for any additional information.   If you received a COVID test during your pre-op visit  it is requested that you wear a mask when out in public, stay away  from anyone that may not be feeling well and notify your surgeon if you develop symptoms. If you have been in contact with anyone that has tested positive in the last 10 days please notify you surgeon.      Pre-operative CHG Bathing Instructions   You can play a key role in reducing the risk of infection after surgery. Your skin needs to be as free of germs as possible. You  can reduce the number of germs on your skin by washing with CHG (chlorhexidine  gluconate) soap before surgery. CHG is an antiseptic soap that kills germs and continues to kill germs even after washing.   DO NOT use if you have an allergy to chlorhexidine /CHG or antibacterial soaps. If your skin becomes reddened or irritated, stop using the CHG and notify one of our RNs at [phone].              TAKE A SHOWER THE NIGHT BEFORE SURGERY   Please keep in mind the following:  DO NOT shave, including legs and underarms, 48 hours prior to surgery.   You may shave your face before/day of surgery.  Place clean sheets on your bed the night before surgery Use a clean washcloth (not used since being washed) for shower. DO NOT sleep with pet's night before surgery.  CHG Shower Instructions:  Wash your face and private area with normal soap. If you choose to wash your hair, wash first with your normal shampoo.  After you use shampoo/soap, rinse your hair and body thoroughly to remove shampoo/soap residue.  Turn the water  OFF and apply half the bottle of CHG soap to a CLEAN washcloth.  Apply CHG soap ONLY FROM YOUR NECK DOWN TO YOUR TOES (washing for 3-5 minutes)  DO NOT use CHG soap on face, private areas, open wounds, or sores.  Pay special attention to the area where your surgery is being performed.  If you are having back surgery, having someone wash your back for you may be helpful. Wait 2 minutes after CHG soap is applied, then you may rinse off the CHG soap.  Pat dry with a clean towel  Put on clean pajamas    Additional instructions for the day of surgery: If you choose, you may shower the morning of surgery with an antibacterial soap.  DO NOT APPLY any lotions, deodorants, or perfumes.   Do not wear jewelry or makeup Do not wear nail polish, gel polish, artificial nails, or any other type of covering on natural nails (fingers and toes) Do not bring valuables to the hospital. [facility] is  not responsible for valuables/personal belongings. Put on clean/comfortable clothes.  Please brush your teeth.  Ask your nurse before applying any prescription medications to the skin.

## 2024-01-19 ENCOUNTER — Encounter (HOSPITAL_COMMUNITY)
Admission: RE | Admit: 2024-01-19 | Discharge: 2024-01-19 | Disposition: A | Discharge status: Home / Self Care | Source: Ambulatory Visit

## 2024-01-19 ENCOUNTER — Other Ambulatory Visit: Payer: Self-pay

## 2024-01-19 ENCOUNTER — Encounter (HOSPITAL_COMMUNITY): Payer: Self-pay

## 2024-01-19 VITALS — BP 134/57 | HR 73 | Temp 97.8°F | Resp 18 | Ht 60.0 in | Wt 208.5 lb

## 2024-01-19 DIAGNOSIS — N184 Chronic kidney disease, stage 4 (severe): Secondary | ICD-10-CM | POA: Insufficient documentation

## 2024-01-19 DIAGNOSIS — G4733 Obstructive sleep apnea (adult) (pediatric): Secondary | ICD-10-CM | POA: Insufficient documentation

## 2024-01-19 DIAGNOSIS — I5032 Chronic diastolic (congestive) heart failure: Secondary | ICD-10-CM | POA: Diagnosis not present

## 2024-01-19 DIAGNOSIS — Z01812 Encounter for preprocedural laboratory examination: Secondary | ICD-10-CM | POA: Diagnosis present

## 2024-01-19 DIAGNOSIS — Z01818 Encounter for other preprocedural examination: Secondary | ICD-10-CM

## 2024-01-19 DIAGNOSIS — E1122 Type 2 diabetes mellitus with diabetic chronic kidney disease: Secondary | ICD-10-CM | POA: Diagnosis not present

## 2024-01-19 DIAGNOSIS — Z9641 Presence of insulin pump (external) (internal): Secondary | ICD-10-CM | POA: Diagnosis not present

## 2024-01-19 DIAGNOSIS — Z9884 Bariatric surgery status: Secondary | ICD-10-CM | POA: Diagnosis not present

## 2024-01-19 DIAGNOSIS — E039 Hypothyroidism, unspecified: Secondary | ICD-10-CM | POA: Diagnosis not present

## 2024-01-19 DIAGNOSIS — Z794 Long term (current) use of insulin: Secondary | ICD-10-CM | POA: Diagnosis not present

## 2024-01-19 DIAGNOSIS — I130 Hypertensive heart and chronic kidney disease with heart failure and stage 1 through stage 4 chronic kidney disease, or unspecified chronic kidney disease: Secondary | ICD-10-CM | POA: Diagnosis not present

## 2024-01-19 LAB — GLUCOSE, CAPILLARY: Glucose-Capillary: 142 mg/dL — ABNORMAL HIGH (ref 70–99)

## 2024-01-19 LAB — HEMOGLOBIN A1C
Hgb A1c MFr Bld: 5.1 % (ref 4.8–5.6)
Mean Plasma Glucose: 99.67 mg/dL

## 2024-01-19 NOTE — Progress Notes (Signed)
 PCP - Dr. Elna Potters Cardiologist - Dr. Jackee Alberts LOV 11-14-23 f/u in 6 months  PPM/ICD - Denies Device Orders - n/a Rep Notified - n/a Per patient she had her loop recorder removed.  Chest x-ray - 11-26-23 EKG - 11-14-23 Stress Test - 02-25-11 ECHO - 05-07-14 Cardiac Cath - 04-09-2013  Sleep Study - denies CPAP - n/a  Fasting Blood Sugar - 100-120 Checks Blood Sugar daily, she wears and insulin  pump with a blood glucose monitor  Last dose of GLP1 agonist-  Ozempic , per patient last dose on Wednesday, Nov 19 GLP1 instructions: Hold for 7 days Insulin  pump - patient instructed to call PCP or decrease insulin  basal rate by 20% at midnight the night before surgery.   Blood Thinner Instructions: Denies Aspirin  Instructions: per patient holding for 7 day's prior Farxiga - per PCP hold for 3 day's prior to surgery  ERAS Protcol - clears until 1030 PRE-SURGERY Ensure or G2- none  COVID TEST- n/a   Anesthesia review: Yes, DM2 on insulin  pump, asthma, CKD, diastolic HF  Patient denies shortness of breath, fever, cough and chest pain at PAT appointment. Patient denies any respiratory issues at this time.    All instructions explained to the patient, with a verbal understanding of the material. Patient agrees to go over the instructions while at home for a better understanding. Patient also instructed to self quarantine after being tested for COVID-19. The opportunity to ask questions was provided.

## 2024-01-22 NOTE — Progress Notes (Signed)
 Anesthesia Chart Review:   58 year old female follows with cardiology for history of HTN, HLD, HFpEF, OSA.  She has a nonfunctioning loop recorder in place.  Cath 2015 with normal coronaries.  Echo 12/2020 with normal EF and no significant valvular abnormalities.  Carotid ultrasound 04/2022 without significant stenosis.  Last seen in follow-up by Jackee Alberts, NP on 11/14/2023 for preop evaluation.  Per note, Patient's RCRI score is 11%. The patient affirms she has been doing well without any new cardiac symptoms. They are able to achieve 6 METS without cardiac limitations. Therefore, based on ACC/AHA guidelines, the patient would be at acceptable risk for the planned procedure without further cardiovascular testing. The patient was advised that if she develops new symptoms prior to surgery to contact our office to arrange for a follow-up visit, and she verbalized understanding.  Other pertinent history includes CKD 3/4, IDDM 2 on insulin  pump, hypothyroidism, asthma (with remote history of tracheostomy), morbid obesity s/p Roux-en-Y gastric bypass 2020.  Patient reports last dose of Ozempic  01/17/2024.  Review of prior anesthesia records shows that GlideScope was used for intubation 05/01/2018.  CBC and BMP 01/01/2024 reviewed, creatinine elevated 1.83 consistent with history of CKD, otherwise unremarkable.  EKG 11/14/2023: Sinus bradycardia with 1st degree A-V block.  Rate 56.  Echocardiogram 01/19/2021:  Normal LV systolic function with visual EF 55-60%. Left ventricle cavity  is normal in size. Normal left ventricular wall thickness. Normal global  wall motion. Indeterminate diastolic filling pattern, elevated LAP.  Mild tricuspid regurgitation. No evidence of pulmonary hypertension.  Compared to study 02/17/2011 no significant change.      Lynwood Geofm RIGGERS [facility]/Anesthesiology Phone [phone] 01/22/2024 12:31 PM

## 2024-01-22 NOTE — Anesthesia Preprocedure Evaluation (Addendum)
 Anesthesia Evaluation  Patient identified by MRN, date of birth, ID band Patient awake    Reviewed: Allergy & Precautions, H&P , NPO status , Patient's Chart, lab work & pertinent test results, reviewed documented beta blocker date and time   Airway Mallampati: II  TM Distance: >3 FB Neck ROM: full    Dental no notable dental hx.    Pulmonary asthma    Pulmonary exam normal breath sounds clear to auscultation       Cardiovascular Exercise Tolerance: Good hypertension, +CHF   Rhythm:regular Rate:Normal     Neuro/Psych negative neurological ROS  negative psych ROS   GI/Hepatic negative GI ROS, Neg liver ROS,,,  Endo/Other  diabetes, Insulin  Dependent  Class 3 obesity  Renal/GU CRFRenal disease  negative genitourinary   Musculoskeletal   Abdominal  (+) + obese  Peds  Hematology negative hematology ROS (+)   Anesthesia Other Findings Echocardiogram 01/19/2021:  Normal LV systolic function with visual EF 55-60%. Left ventricle cavity  is normal in size. Normal left ventricular wall thickness. Normal global  wall motion. Indeterminate diastolic filling pattern, elevated LAP.  Mild tricuspid regurgitation. No evidence of pulmonary hypertension.  Compared to study 02/17/2011 no significant change.   Reproductive/Obstetrics negative OB ROS                              Anesthesia Physical Anesthesia Plan  ASA: 3  Anesthesia Plan: General   Post-op Pain Management: Tylenol  PO (pre-op)* and Gabapentin  PO (pre-op)*   Induction: Intravenous  PONV Risk Score and Plan: 3 and Ondansetron , Dexamethasone , Midazolam  and Treatment may vary due to age or medical condition  Airway Management Planned: Oral ETT  Additional Equipment:   Intra-op Plan:   Post-operative Plan: Extubation in OR  Informed Consent: I have reviewed the patients History and Physical, chart, labs and discussed the  procedure including the risks, benefits and alternatives for the proposed anesthesia with the patient or authorized representative who has indicated his/her understanding and acceptance.     Dental Advisory Given  Plan Discussed with: CRNA  Anesthesia Plan Comments: (PAT note by Lynwood Hope, PA-C:  58 year old female follows with cardiology for history of HTN, HLD, HFpEF, OSA.  She has a nonfunctioning loop recorder in place.  Cath 2015 with normal coronaries.  Echo 12/2020 with normal EF and no significant valvular abnormalities.  Carotid ultrasound 04/2022 without significant stenosis.  Last seen in follow-up by Jackee Alberts, NP on 11/14/2023 for preop evaluation.  Per note, Patient's RCRI score is 11%. The patient affirms she has been doing well without any new cardiac symptoms. They are able to achieve 6 METS without cardiac limitations. Therefore, based on ACC/AHA guidelines, the patient would be at acceptable risk for the planned procedure without further cardiovascular testing. The patient was advised that if she develops new symptoms prior to surgery to contact our office to arrange for a follow-up visit, and she verbalized understanding.  Other pertinent history includes CKD 3/4, IDDM 2 on insulin  pump, hypothyroidism, asthma (with remote history of tracheostomy), morbid obesity s/p Roux-en-Y gastric bypass 2020.  Patient reports last dose of Ozempic  01/17/2024.  Review of prior anesthesia records shows that GlideScope was used for intubation 05/01/2018.  CBC and BMP 01/01/2024 reviewed, creatinine elevated 1.83 consistent with history of CKD, otherwise unremarkable.  EKG 11/14/2023: Sinus bradycardia with 1st degree A-V block.  Rate 56.  Echocardiogram 01/19/2021:  Normal LV systolic function with visual EF 55-60%.  Left ventricle cavity  is normal in size. Normal left ventricular wall thickness. Normal global  wall motion. Indeterminate diastolic filling pattern, elevated LAP.  Mild  tricuspid regurgitation. No evidence of pulmonary hypertension.  Compared to study 02/17/2011 no significant change.    )         Anesthesia Quick Evaluation

## 2024-01-29 ENCOUNTER — Encounter (HOSPITAL_COMMUNITY): Payer: Self-pay

## 2024-01-29 ENCOUNTER — Encounter (HOSPITAL_COMMUNITY): Admission: RE | Disposition: A | Discharge status: Home / Self Care | Payer: Self-pay | Source: Home / Self Care

## 2024-01-29 ENCOUNTER — Other Ambulatory Visit: Payer: Self-pay

## 2024-01-29 ENCOUNTER — Ambulatory Visit (HOSPITAL_COMMUNITY)

## 2024-01-29 ENCOUNTER — Ambulatory Visit (HOSPITAL_COMMUNITY): Admission: RE | Admit: 2024-01-29 | Discharge: 2024-01-29 | Disposition: A | Discharge status: Home / Self Care

## 2024-01-29 ENCOUNTER — Ambulatory Visit (HOSPITAL_COMMUNITY): Payer: Self-pay | Admitting: Physician Assistant

## 2024-01-29 DIAGNOSIS — I130 Hypertensive heart and chronic kidney disease with heart failure and stage 1 through stage 4 chronic kidney disease, or unspecified chronic kidney disease: Secondary | ICD-10-CM

## 2024-01-29 DIAGNOSIS — E65 Localized adiposity: Secondary | ICD-10-CM

## 2024-01-29 DIAGNOSIS — I5032 Chronic diastolic (congestive) heart failure: Secondary | ICD-10-CM

## 2024-01-29 DIAGNOSIS — N183 Chronic kidney disease, stage 3 unspecified: Secondary | ICD-10-CM

## 2024-01-29 DIAGNOSIS — M793 Panniculitis, unspecified: Secondary | ICD-10-CM | POA: Diagnosis not present

## 2024-01-29 DIAGNOSIS — Z01818 Encounter for other preprocedural examination: Secondary | ICD-10-CM

## 2024-01-29 HISTORY — PX: PANNICULECTOMY: SHX5360

## 2024-01-29 LAB — GLUCOSE, CAPILLARY
Glucose-Capillary: 65 mg/dL — ABNORMAL LOW (ref 70–99)
Glucose-Capillary: 103 mg/dL — ABNORMAL HIGH (ref 70–99)
Glucose-Capillary: 61 mg/dL — ABNORMAL LOW (ref 70–99)
Glucose-Capillary: 74 mg/dL (ref 70–99)
Glucose-Capillary: 96 mg/dL (ref 70–99)
Glucose-Capillary: 122 mg/dL — ABNORMAL HIGH (ref 70–99)

## 2024-01-29 SURGERY — PANNICULECTOMY
Anesthesia: General | Site: Abdomen | Laterality: Bilateral

## 2024-01-29 MED ORDER — BUPIVACAINE HCL (PF) 0.25 % IJ SOLN
INTRAMUSCULAR | Status: AC
  Filled 2024-01-29: qty 30

## 2024-01-29 MED ORDER — ROCURONIUM BROMIDE 10 MG/ML (PF) SYRINGE
PREFILLED_SYRINGE | INTRAVENOUS | Status: AC
  Filled 2024-01-29: qty 10

## 2024-01-29 MED ORDER — OXYCODONE HCL 5 MG PO TABS
ORAL_TABLET | ORAL | Status: AC
  Filled 2024-01-29: qty 1

## 2024-01-29 MED ORDER — GABAPENTIN 300 MG PO CAPS
300.0000 mg | ORAL_CAPSULE | ORAL | Status: AC
  Administered 2024-01-29: 300 mg via ORAL
  Filled 2024-01-29: qty 1

## 2024-01-29 MED ORDER — HYDROMORPHONE HCL 1 MG/ML IJ SOLN: 0.2500 mg | INTRAMUSCULAR | Status: DC | PRN

## 2024-01-29 MED ORDER — LIDOCAINE-EPINEPHRINE 1 %-1:100000 IJ SOLN
INTRAMUSCULAR | Status: DC | PRN
  Administered 2024-01-29: 40 mL

## 2024-01-29 MED ORDER — ALBUTEROL SULFATE HFA 108 (90 BASE) MCG/ACT IN AERS
INHALATION_SPRAY | RESPIRATORY_TRACT | Status: DC | PRN
  Administered 2024-01-29: 4 via RESPIRATORY_TRACT

## 2024-01-29 MED ORDER — MIDAZOLAM HCL (PF) 2 MG/2ML IJ SOLN
INTRAMUSCULAR | Status: DC | PRN
  Administered 2024-01-29: 1 mg via INTRAVENOUS

## 2024-01-29 MED ORDER — SCOPOLAMINE 1 MG/3DAYS TD PT72
1.0000 | MEDICATED_PATCH | TRANSDERMAL | Status: DC
  Administered 2024-01-29: 1 mg via TRANSDERMAL
  Filled 2024-01-29: qty 1

## 2024-01-29 MED ORDER — CHLORHEXIDINE GLUCONATE 0.12 % MT SOLN
OROMUCOSAL | Status: AC
  Administered 2024-01-29: 15 mL via OROMUCOSAL
  Filled 2024-01-29: qty 15

## 2024-01-29 MED ORDER — EPHEDRINE SULFATE-NACL 50-0.9 MG/10ML-% IV SOSY
PREFILLED_SYRINGE | INTRAVENOUS | Status: DC | PRN
  Administered 2024-01-29 (×2): 5 mg via INTRAVENOUS
  Administered 2024-01-29: 10 mg via INTRAVENOUS

## 2024-01-29 MED ORDER — PHENYLEPHRINE HCL-NACL 20-0.9 MG/250ML-% IV SOLN
INTRAVENOUS | Status: DC | PRN
  Administered 2024-01-29: 20 ug/min via INTRAVENOUS

## 2024-01-29 MED ORDER — VASHE WOUND IRRIGATION OPTIME
TOPICAL | Status: DC | PRN
  Administered 2024-01-29: 34 [oz_av]

## 2024-01-29 MED ORDER — CHLORHEXIDINE GLUCONATE CLOTH 2 % EX PADS: 6.0000 | MEDICATED_PAD | Freq: Once | CUTANEOUS | Status: DC

## 2024-01-29 MED ORDER — 0.9 % SODIUM CHLORIDE (POUR BTL) OPTIME
TOPICAL | Status: DC | PRN
  Administered 2024-01-29: 2000 mL

## 2024-01-29 MED ORDER — DEXTROSE 50 % IV SOLN
INTRAVENOUS | Status: DC | PRN
  Administered 2024-01-29: 12.5 g via INTRAVENOUS

## 2024-01-29 MED ORDER — ONDANSETRON HCL 4 MG/2ML IJ SOLN
INTRAMUSCULAR | Status: AC
  Filled 2024-01-29: qty 2

## 2024-01-29 MED ORDER — LIDOCAINE 2% (20 MG/ML) 5 ML SYRINGE
INTRAMUSCULAR | Status: AC
  Filled 2024-01-29: qty 5

## 2024-01-29 MED ORDER — CEFAZOLIN SODIUM-DEXTROSE 2-4 GM/100ML-% IV SOLN
2.0000 g | INTRAVENOUS | Status: AC
  Administered 2024-01-29: 2 g via INTRAVENOUS
  Filled 2024-01-29: qty 100

## 2024-01-29 MED ORDER — OXYCODONE HCL 5 MG/5ML PO SOLN: 5.0000 mg | Freq: Once | ORAL | Status: AC | PRN

## 2024-01-29 MED ORDER — KETAMINE HCL 50 MG/5ML IJ SOSY
PREFILLED_SYRINGE | INTRAMUSCULAR | Status: AC
  Filled 2024-01-29: qty 5

## 2024-01-29 MED ORDER — FENTANYL CITRATE (PF) 100 MCG/2ML IJ SOLN
INTRAMUSCULAR | Status: AC
  Filled 2024-01-29: qty 2

## 2024-01-29 MED ORDER — OXYCODONE HCL 5 MG PO TABS
5.0000 mg | ORAL_TABLET | Freq: Once | ORAL | Status: AC | PRN
  Administered 2024-01-29: 5 mg via ORAL

## 2024-01-29 MED ORDER — LIDOCAINE 2% (20 MG/ML) 5 ML SYRINGE
INTRAMUSCULAR | Status: DC | PRN
  Administered 2024-01-29: 100 mg via INTRAVENOUS

## 2024-01-29 MED ORDER — CHLORHEXIDINE GLUCONATE 0.12 % MT SOLN
15.0000 mL | Freq: Once | OROMUCOSAL | Status: AC
  Filled 2024-01-29: qty 15

## 2024-01-29 MED ORDER — LACTATED RINGERS IV SOLN: INTRAVENOUS | Status: DC

## 2024-01-29 MED ORDER — HEPARIN SODIUM (PORCINE) 5000 UNIT/ML IJ SOLN: 5000.0000 [IU] | Freq: Once | INTRAMUSCULAR | Status: AC

## 2024-01-29 MED ORDER — AMISULPRIDE (ANTIEMETIC) 5 MG/2ML IV SOLN: 10.0000 mg | Freq: Once | INTRAVENOUS | Status: DC | PRN

## 2024-01-29 MED ORDER — ROCURONIUM BROMIDE 10 MG/ML (PF) SYRINGE
PREFILLED_SYRINGE | INTRAVENOUS | Status: DC | PRN
  Administered 2024-01-29: 90 mg via INTRAVENOUS

## 2024-01-29 MED ORDER — PROPOFOL 10 MG/ML IV BOLUS
INTRAVENOUS | Status: DC | PRN
  Administered 2024-01-29: 150 mg via INTRAVENOUS

## 2024-01-29 MED ORDER — DEXAMETHASONE SOD PHOSPHATE PF 10 MG/ML IJ SOLN
INTRAMUSCULAR | Status: DC | PRN
  Administered 2024-01-29: 10 mg via INTRAVENOUS

## 2024-01-29 MED ORDER — GLYCOPYRROLATE 0.2 MG/ML IJ SOLN
INTRAMUSCULAR | Status: DC | PRN
  Administered 2024-01-29: .2 mg via INTRAVENOUS

## 2024-01-29 MED ORDER — ACETAMINOPHEN 500 MG PO TABS
1000.0000 mg | ORAL_TABLET | ORAL | Status: AC
  Administered 2024-01-29: 1000 mg via ORAL
  Filled 2024-01-29: qty 2

## 2024-01-29 MED ORDER — KETAMINE HCL 10 MG/ML IJ SOLN
INTRAMUSCULAR | Status: DC | PRN
  Administered 2024-01-29: 20 mg via INTRAVENOUS
  Administered 2024-01-29: 30 mg via INTRAVENOUS

## 2024-01-29 MED ORDER — PROPOFOL 10 MG/ML IV BOLUS
INTRAVENOUS | Status: AC
  Filled 2024-01-29: qty 20

## 2024-01-29 MED ORDER — SUGAMMADEX SODIUM 200 MG/2ML IV SOLN
INTRAVENOUS | Status: DC | PRN
  Administered 2024-01-29: 400 mg via INTRAVENOUS

## 2024-01-29 MED ORDER — ORAL CARE MOUTH RINSE: 15.0000 mL | Freq: Once | OROMUCOSAL | Status: AC

## 2024-01-29 MED ORDER — GLYCOPYRROLATE PF 0.2 MG/ML IJ SOSY
PREFILLED_SYRINGE | INTRAMUSCULAR | Status: AC
  Filled 2024-01-29: qty 1

## 2024-01-29 MED ORDER — DEXTROSE 50 % IV SOLN
12.5000 g | INTRAVENOUS | Status: AC
  Administered 2024-01-29: 12.5 g via INTRAVENOUS
  Filled 2024-01-29: qty 50

## 2024-01-29 MED ORDER — SODIUM CHLORIDE 0.9 % IV SOLN: INTRAVENOUS | Status: DC | PRN

## 2024-01-29 MED ORDER — MIDAZOLAM HCL 2 MG/2ML IJ SOLN
INTRAMUSCULAR | Status: AC
  Filled 2024-01-29: qty 2

## 2024-01-29 MED ORDER — EPHEDRINE 5 MG/ML INJ
INTRAVENOUS | Status: AC
  Filled 2024-01-29: qty 5

## 2024-01-29 MED ORDER — HEPARIN SODIUM (PORCINE) 5000 UNIT/ML IJ SOLN
INTRAMUSCULAR | Status: AC
  Administered 2024-01-29: 5000 [IU] via SUBCUTANEOUS
  Filled 2024-01-29: qty 1

## 2024-01-29 MED ORDER — ONDANSETRON HCL 4 MG/2ML IJ SOLN
INTRAMUSCULAR | Status: DC | PRN
  Administered 2024-01-29: 4 mg via INTRAVENOUS

## 2024-01-29 MED ORDER — LIDOCAINE-EPINEPHRINE 1 %-1:100000 IJ SOLN
INTRAMUSCULAR | Status: AC
  Filled 2024-01-29: qty 1

## 2024-01-29 MED ORDER — FENTANYL CITRATE (PF) 250 MCG/5ML IJ SOLN
INTRAMUSCULAR | Status: DC | PRN
  Administered 2024-01-29: 50 ug via INTRAVENOUS
  Administered 2024-01-29: 100 ug via INTRAVENOUS
  Administered 2024-01-29 (×2): 25 ug via INTRAVENOUS

## 2024-01-29 SURGICAL SUPPLY — 41 items
BINDER ABD UNIV 12 45-62 (WOUND CARE) IMPLANT
BIOPATCH RED 1 DISK 7.0 (GAUZE/BANDAGES/DRESSINGS) ×2 IMPLANT
BLADE SURG 10 STRL SS (BLADE) ×2 IMPLANT
BLADE SURG 15 STRL LF DISP TIS (BLADE) ×1 IMPLANT
CANISTER SUCTION 3000ML PPV (SUCTIONS) ×1 IMPLANT
CHLORAPREP W/TINT 26 (MISCELLANEOUS) ×2 IMPLANT
CLEANSER WND VASHE INSTL 34OZ (WOUND CARE) IMPLANT
CLIP APPLIE 13 LRG OPEN (CLIP) IMPLANT
CLIP APPLIE 9.375 MED OPEN (MISCELLANEOUS) ×1 IMPLANT
CONT SPEC PATH 64OZ SNAP LID (MISCELLANEOUS) ×1 IMPLANT
COVER SURGICAL LIGHT HANDLE (MISCELLANEOUS) ×1 IMPLANT
DERMABOND ADVANCED .7 DNX12 (GAUZE/BANDAGES/DRESSINGS) ×2 IMPLANT
DRAIN CHANNEL 19F RND (DRAIN) IMPLANT
DRAPE HALF SHEET 40X57 (DRAPES) ×1 IMPLANT
DRSG TEGADERM 4X4.75 (GAUZE/BANDAGES/DRESSINGS) ×2 IMPLANT
ELECT COATED BLADE 2.86 ST (ELECTRODE) ×2 IMPLANT
ELECTRODE BLDE 4.0 EZ CLN MEGD (MISCELLANEOUS) ×1 IMPLANT
ELECTRODE REM PT RTRN 9FT ADLT (ELECTROSURGICAL) ×2 IMPLANT
EVACUATOR SILICONE 100CC (DRAIN) ×2 IMPLANT
GAUZE PAD ABD 8X10 STRL (GAUZE/BANDAGES/DRESSINGS) IMPLANT
GAUZE SPONGE 4X4 12PLY STRL (GAUZE/BANDAGES/DRESSINGS) ×2 IMPLANT
GLOVE BIOGEL M STRL SZ7.5 (GLOVE) ×1 IMPLANT
GLOVE BIOGEL PI IND STRL 8 (GLOVE) ×1 IMPLANT
GOWN STRL REUS W/ TWL LRG LVL3 (GOWN DISPOSABLE) ×2 IMPLANT
GOWN STRL REUS W/ TWL XL LVL3 (GOWN DISPOSABLE) ×1 IMPLANT
HYDROGEN PEROXIDE 16OZ (MISCELLANEOUS) IMPLANT
KIT BASIN OR (CUSTOM PROCEDURE TRAY) ×1 IMPLANT
PACK GENERAL/GYN (CUSTOM PROCEDURE TRAY) ×1 IMPLANT
PACK UNIVERSAL I (CUSTOM PROCEDURE TRAY) ×1 IMPLANT
PENCIL SMOKE EVACUATOR (MISCELLANEOUS) ×1 IMPLANT
PIN SAFETY STERILE (MISCELLANEOUS) ×1 IMPLANT
SLEEVE SCD COMPRESS KNEE MED (STOCKING) ×1 IMPLANT
SOLN 0.9% NACL POUR BTL 1000ML (IV SOLUTION) ×1 IMPLANT
SPONGE T-LAP 18X18 ~~LOC~~+RFID (SPONGE) ×2 IMPLANT
STAPLER SKIN PROX 35W (STAPLE) IMPLANT
STRIP CLOSURE SKIN 1/2X4 (GAUZE/BANDAGES/DRESSINGS) IMPLANT
SUT MNCRL AB 3-0 PS2 27 (SUTURE) ×2 IMPLANT
SUT PDS AB 3-0 SH 27 (SUTURE) IMPLANT
SUT SILK 2 0 SH (SUTURE) IMPLANT
SUT VIC AB 0 CT1 18XCR BRD 8 (SUTURE) IMPLANT
SUT VIC AB 0 CT1 18XCR BRD8 (SUTURE) IMPLANT

## 2024-01-29 NOTE — Op Note (Signed)
 Operative Note   DATE OF OPERATION: 01/29/2024  LOCATION: [facility] Main OR  SURGICAL DEPARTMENT: Plastic Surgery  PREOPERATIVE DIAGNOSES:  Panniculus   POSTOPERATIVE DIAGNOSES:  same  PROCEDURE:  Panniculectomy  SURGEON: Kenny Rea, MD  ASSISTANT: Estefana Peck, PA  ANESTHESIA:  General.   COMPLICATIONS: None.   EBL: < 50 cc  INDICATIONS FOR PROCEDURE:  The patient, [name] is a 58 y.o. female born on 1965/04/06, is here for treatment of panniculus. MRN: [id number]  CONSENT:  The patient was seen for the planned panniculectomy. Explained to the patient that this is a functional operation and not a cosmetic operation, patient expressed understanding.  Informed consent was obtained directly from the patient. Risks, benefits and alternatives were fully discussed. Specific risks including but not limited to bleeding, infection, hematoma, seroma, scarring, pain, infection, contracture, asymmetry, wound healing problems, and need for further surgery were all discussed.  I also explained that sometimes the umbilicus needs to be resected.  The patient did have an ample opportunity to have questions answered to satisfaction.    DESCRIPTION OF PROCEDURE:  The patient was taken to the operating room. SCDs were placed and IV antibiotics were given. The patient's operative site was prepped and draped in a sterile fashion. A time out was performed and all information was confirmed to be correct.  General anesthesia was administered.     We addressed the panniculectomy. The patient had been marked in the standing position. We made the lower incision that was marked to be about 8 cm above the anterior fourchette and tapered gently superiorly as it moved laterally. The flap was elevated off the fascia leaving small amounts of fatty tissue on the fascia up to the umbilicus and no further laterally than the lateral rectus border once above the umbilicus.   We then advanced the mons  pubis skin and tacked it exactly where we wanted it with 0 Vicryl sutures. The patient was then flexed conservatively the abdominal flap was advanced inferiorly and excess skin removed in sections.   The abdomen was washed with Peroxide first and then Vashe. Hemostasis was achieved meticulously with cautery. I performed this twice. Valsalva maneuver done as well times two, hemostasis confirmed. Two 28F French Blake drains were brought out laterally. The incision was then closed with 0 Vicryl in a triangulation fashion at the Scarpa's level. The deep dermis was approximated with 3-0 PDS and the skin was closed with a 3-0 Monocryl. Sterile dressings were applied.   The patient tolerated the procedure well.  There were no complications. The patient was allowed to wake from anesthesia, extubated and taken to the recovery room in satisfactory condition.    The advanced practice practitioner (APP) assisted throughout the case.  The APP was essential in retraction and counter traction when needed to make the case progress smoothly.  This retraction and assistance made it possible to see the tissue plans for the procedure.  The assistance was needed for blood control, tissue re-approximation and assisted with closure of the incision site.   Ludwin Flahive M. Bekah Igoe, MD [facility]

## 2024-01-29 NOTE — Discharge Instructions (Addendum)
 Activity (include date of return to work if known) As tolerated: NO showers for 48 hours NO driving No heavy activities  Diet:regular No restrictions  Wound Care: Keep dressing clean & dry  Do not change dressings For Abdominoplasties wear abdominal binder Special Instructions: Continue to empty, recharge, & record drainage from J-P drains &/or Hemovacs 2-3 times a day, as needed. Call Doctor if any unusual problems occur such as pain, excessive Bleeding, unrelieved Nausea/vomiting, Fever &/or chills When lying down, keep head elevated on 2-3 pillows or back-rest For Addominoplasties the Jack-knife position Follow-up appointment: Please call the office.  The patient received discharge instruction from:___________________________________________   Patient signature ________________________________________ / Date___________    Signature of individual providing instructions/ Date________________

## 2024-01-29 NOTE — Anesthesia Postprocedure Evaluation (Signed)
 Anesthesia Post Note  Patient: [name]  Procedure(s) Performed: PANNICULECTOMY (Bilateral: Abdomen)     Patient location during evaluation: PACU Anesthesia Type: General Level of consciousness: awake and alert Pain management: pain level controlled Vital Signs Assessment: post-procedure vital signs reviewed and stable Respiratory status: spontaneous breathing, nonlabored ventilation and respiratory function stable Cardiovascular status: blood pressure returned to baseline and stable Postop Assessment: no apparent nausea or vomiting Anesthetic complications: no   There were no known notable events for this encounter.  Last Vitals:  Vitals:   01/29/24 1615 01/29/24 1630  BP: (!) 117/58 (!) 98/53  Pulse: (!) 55 (!) 59  Resp: 13 13  Temp:  (!) 34.5 C  SpO2: 100% 98%    Last Pain:  Vitals:   01/29/24 1605  TempSrc:   PainSc: Asleep                 Garnette FORBES Skillern

## 2024-01-29 NOTE — Anesthesia Procedure Notes (Signed)
 Procedure Name: Intubation Date/Time: 01/29/2024 12:42 PM  Performed by: Evette Ade, CRNAPre-anesthesia Checklist: Patient identified, Emergency Drugs available, Suction available and Patient being monitored Patient Re-evaluated:Patient Re-evaluated prior to induction Oxygen  Delivery Method: Circle System Utilized Preoxygenation: Pre-oxygenation with 100% oxygen  Induction Type: IV induction Ventilation: Mask ventilation without difficulty Laryngoscope Size: Glidescope and 3 Grade View: Grade I Tube type: Oral Tube size: 7.0 mm Number of attempts: 1 Airway Equipment and Method: Stylet Placement Confirmation: ETT inserted through vocal cords under direct vision, positive ETCO2 and breath sounds checked- equal and bilateral Secured at: 20 cm Tube secured with: Tape Dental Injury: Teeth and Oropharynx as per pre-operative assessment

## 2024-01-29 NOTE — Transfer of Care (Signed)
 Immediate Anesthesia Transfer of Care Note  Patient: [name]  Procedure(s) Performed: PANNICULECTOMY (Bilateral: Abdomen)  Patient Location: PACU  Anesthesia Type:General  Level of Consciousness: drowsy  Airway & Oxygen  Therapy: Patient Spontanous Breathing and Patient connected to face mask oxygen   Post-op Assessment: Report given to RN and Post -op Vital signs reviewed and stable  Post vital signs: Reviewed and stable  Last Vitals:  Vitals Value Taken Time  BP 141/69 01/29/24 16:05  Temp    Pulse 61 01/29/24 16:09  Resp 12 01/29/24 16:09  SpO2 100 % 01/29/24 16:09  Vitals shown include unfiled device data.  Last Pain:  Vitals:   01/29/24 1203  TempSrc:   PainSc: 0-No pain         Complications: There were no known notable events for this encounter.

## 2024-01-29 NOTE — Interval H&P Note (Signed)
 History and Physical Interval Note:  01/29/2024 11:45 AM  [name]  has presented today for surgery, with the diagnosis of Panniculus.  The various methods of treatment have been discussed with the patient and family. After consideration of risks, benefits and other options for treatment, the patient has consented to  Procedure(s): PANNICULECTOMY (Bilateral) as a surgical intervention.  The patient's history has been reviewed, patient examined, no change in status, stable for surgery.  I have reviewed the patient's chart and labs.  Questions were answered to the patient's satisfaction.     Rosemae Mcquown M Treyton Slimp

## 2024-01-30 ENCOUNTER — Telehealth: Payer: Self-pay

## 2024-01-30 ENCOUNTER — Encounter (HOSPITAL_COMMUNITY): Payer: Self-pay

## 2024-01-30 NOTE — Telephone Encounter (Signed)
 I just spoke with a pt due to her post op needing to be moved bc provider in sx, pt stated some gauze fell out of her bandage at her wound site, she wants to know if that is ok, PostOP: Panniculectomy / Ins / Sx 01-29-24 [facility] 1330

## 2024-01-30 NOTE — Telephone Encounter (Signed)
 I called the patient in regards to her message. I told her it was fine that some gauze came out. She can replace it or use a feminine pad. She also had questions about her drains. I told her they need to be flat and held to suction at all times. Patient conveyed understanding.

## 2024-02-01 LAB — SURGICAL PATHOLOGY

## 2024-02-07 ENCOUNTER — Encounter

## 2024-02-08 ENCOUNTER — Other Ambulatory Visit: Payer: Self-pay

## 2024-02-08 ENCOUNTER — Emergency Department (HOSPITAL_COMMUNITY)
Admission: EM | Admit: 2024-02-08 | Discharge: 2024-02-08 | Discharge status: Against Medical Advice | Attending: Emergency Medicine | Admitting: Emergency Medicine

## 2024-02-08 ENCOUNTER — Telehealth: Payer: Self-pay

## 2024-02-08 ENCOUNTER — Ambulatory Visit: Admitting: Physician Assistant

## 2024-02-08 ENCOUNTER — Encounter (HOSPITAL_COMMUNITY): Payer: Self-pay

## 2024-02-08 VITALS — BP 143/68 | HR 63

## 2024-02-08 DIAGNOSIS — R2242 Localized swelling, mass and lump, left lower limb: Secondary | ICD-10-CM | POA: Insufficient documentation

## 2024-02-08 DIAGNOSIS — Z5321 Procedure and treatment not carried out due to patient leaving prior to being seen by health care provider: Secondary | ICD-10-CM | POA: Insufficient documentation

## 2024-02-08 DIAGNOSIS — Z9889 Other specified postprocedural states: Secondary | ICD-10-CM

## 2024-02-08 NOTE — Progress Notes (Signed)
 Patient is a pleasant 58 year old female s/p panniculectomy performed 01/29/2024 by Dr. Montorfano who presents to clinic for postoperative follow-up.  Reviewed operative report and incision was closed with combination of Vicryl, PDS, and Monocryl.  Sterile dressings applied and 19 French Blake drains placed out laterally.  Today, patient is doing well.  Some slight postoperative discomfort, but controlled.  Her discomfort is symmetric and not focal.  Patient denies any new or asymmetric leg swelling.  She states that her LLE is chronically more swollen than the RLE and she describes wearing Unna boots at night for lymphedema.  Patient also endorses a personal history of asthma which she states was exacerbated by surgery, but responded well to albuterol  and states that she is back at baseline.  She denies any fevers, chest pain, or other concerns.  She is tolerating p.o. intake without difficulty, voiding urine and BM.  Ambulatory.  On exam, abdomen is soft and nondistended.  No palpable underlying fluid collections.  Incision appears to be healing appropriately.  A few of the Steri-Strips were saturated and fell off.  No large incisional wounds appreciated in the skin edges  appeared well-approximated..  Each of her drains are intact and functional with scant amount of serosanguineous output in bulbs.  Low suspicion for DVT/PE given chronic LLE asymmetric swelling compared to contralateral side in context of lymphedema.  She did have some shortness of breath postoperatively, but states that it was wheezing consistent with her history of asthma and that it responded to albuterol  treatment.  She feels back at baseline.  Given this information, do not feel as though DVT ultrasound or further workup/intervention is warranted and instead will have her continue to monitor her symptoms closely.  If she develops any new or worsening symptoms, recommend that she call our office and go to the ED immediately.  Patient  voices understanding and is agreeable to this plan.  As for her panniculectomy, recommend continued compressive binder 24/7 in addition to activity modifications.  She had been recording the volume output in ounces rather than cc and stated that she was having approximately 1 ounce (30 cc) output from each side daily.  That said, the right drain was slightly less than her left drain which is why the right side was removed.  Advised her to begin recording and cc moving forward to be more precise.  She understands that the residual drain will be left in place for at least another week prior to removal.  Follow-up in 8 days, sooner if needed.  Picture(s) obtained of the patient and placed in the chart were with the patient's or guardian's permission.

## 2024-02-08 NOTE — ED Triage Notes (Signed)
 Pt arrived via POV after having a follow-up appointment with her doctor following recent surgery and was advised to come to [facility] for evaluation of recent left leg swelling, verbalizing concern for possible blood clot.

## 2024-02-08 NOTE — ED Notes (Signed)
 Following Triage, Pt verbalizes she is going to go home and monitor her symptoms and call 911 if symptoms worsen. Pt verbalizes understanding of risks and benefits of leaving before being seen.

## 2024-02-09 ENCOUNTER — Telehealth: Payer: Self-pay

## 2024-02-09 NOTE — Telephone Encounter (Signed)
 Pt just called and said her bandages have come off her tubing and wants to know what she needs to do?

## 2024-02-12 NOTE — Telephone Encounter (Signed)
 I called the patient to answer questions about her bandages. I told her to place gauze and tape over the drain tube insertion sites. Patient conveyed understanding.

## 2024-02-16 ENCOUNTER — Ambulatory Visit (HOSPITAL_COMMUNITY)

## 2024-02-16 ENCOUNTER — Encounter: Payer: Self-pay | Admitting: Student

## 2024-02-16 ENCOUNTER — Ambulatory Visit: Admitting: Student

## 2024-02-16 VITALS — BP 121/63 | HR 64 | Ht 60.5 in | Wt 211.2 lb

## 2024-02-16 DIAGNOSIS — Z9889 Other specified postprocedural states: Secondary | ICD-10-CM

## 2024-02-16 NOTE — Progress Notes (Cosign Needed)
 Patient is a 58 year old female who recently underwent panniculectomy with Dr. Montorfano on 01/29/2024.  Patient is about 2-1/2 weeks postop.  She presents to the clinic today for postoperative follow-up.  Patient was last seen in the clinic on 02/08/2024.  At this visit, patient was doing well.  She reported that her left lower extremity was chronically more swollen than her right lower extremity and described wearing Unna boots at night for the lymphedema.  On exam, abdomen was soft and nondistended.  There were no palpable underlying fluid collections.  Incision was healing appropriately.  Each of the drains were intact and functional with scant amount of serosanguineous drainage in the bulb.  Right drain was removed at this visit.  It was recommended that patient go to the emergency room for evaluation of possible DVT.  Today, patient reports she is doing well.  She states that she has had an odor coming from her incision.  She states that she has been cleaning the area with hand sanitizer and Clorox.  She denies any fevers or chills.  She reports that her drain output has been about 40 to 50 cc over the past day or so.  She denies any chest pain or shortness of breath.  She reports that her left lower extremity is chronically more swollen than her right lower extremity.  She states that this has not changed since after surgery.  She denies any tenderness to her left lower extremity.  Chaperone present on exam.  On exam, patient is sitting upright in no acute distress.  Abdomen is overall soft and nontender.  There is no overlying erythema, no obvious fluid collections on exam.  There are some small superficial wounds noted to the central incision.  Otherwise patient incision is clean dry and intact.  JP drain is in place on the left side and functioning with minimal serosanguineous drainage in the bulb.  There are no signs of infection on exam.  Left lower extremity seems slightly bigger than the right  lower extremity.  There is no tenderness to palpation, no overlying erythema.  Recommended that patient clean her wound and the incision with Vashe.  Discussed with her that this should help with the odor.  Discussed with her to not clean her skin with Clorox and hand sanitizer.  She expressed understanding.  Recommended that she apply Xeroform over the wounds daily followed by dressings and ABD pads.  Discussed with her that if she is unable to get Xeroform, she may apply Vaseline over her wounds followed by the dressing.  Patient expressed understanding.  Will try to order supplies through [facility]  for her.  [facility]  order form was given to clinical staff to fax off.  Discussed with the patient that her drain output is still little bit too high for removal.  Discussed with the patient to continue with compression at all times and avoid strenuous activities.  Discussed with patient that given her left lower extremity is chronically swollen and she is not having any worsening swelling, pain or redness, suspicion for DVT is fairly low, but given history of recent surgery, recommend we obtain an ultrasound to rule out blood clot.  I discussed with the patient that if she develops any chest pain or shortness of breath or worsening leg swelling or pain she needs to go to the emergency room right away.  Patient expressed understanding.  Patient to follow back up next week at her scheduled appointment.  I instructed her to call in the meantime  if she has any questions or concerns about anything.  Dr. Montorfano had the opportunity to evaluate the patient and was in agreement with the plan.

## 2024-02-19 ENCOUNTER — Ambulatory Visit (HOSPITAL_COMMUNITY)
Admission: RE | Admit: 2024-02-19 | Discharge: 2024-02-19 | Disposition: A | Discharge status: Home / Self Care | Source: Ambulatory Visit | Attending: Student | Admitting: Student

## 2024-02-19 DIAGNOSIS — Z9889 Other specified postprocedural states: Secondary | ICD-10-CM | POA: Diagnosis present

## 2024-02-20 ENCOUNTER — Ambulatory Visit: Payer: Self-pay | Admitting: Student

## 2024-02-20 ENCOUNTER — Telehealth: Payer: Self-pay | Admitting: *Deleted

## 2024-02-20 NOTE — Telephone Encounter (Signed)
 Called patient and informed her of results. She expressed understanding. Encouraged her to follow up with her PCP for chronic swelling. She expressed understanding

## 2024-02-20 NOTE — Telephone Encounter (Signed)
 Faxed order to [facility]  [employer] [facility] [employer] for supplies for the patient.  Supplies:ABD pads:Daily                Xeroform:Daily                Sterile Gauze (4x4):Daily                 Confirmation received and copy scanned into the chart.//AR/CMA

## 2024-02-21 ENCOUNTER — Emergency Department (HOSPITAL_COMMUNITY)
Admission: EM | Admit: 2024-02-21 | Discharge: 2024-02-21 | Disposition: A | Discharge status: Home / Self Care | Attending: Emergency Medicine | Admitting: Emergency Medicine

## 2024-02-21 ENCOUNTER — Other Ambulatory Visit: Payer: Self-pay

## 2024-02-21 ENCOUNTER — Emergency Department (HOSPITAL_COMMUNITY)

## 2024-02-21 ENCOUNTER — Ambulatory Visit: Admitting: Physician Assistant

## 2024-02-21 ENCOUNTER — Encounter: Payer: Self-pay | Admitting: Physician Assistant

## 2024-02-21 ENCOUNTER — Encounter (HOSPITAL_COMMUNITY): Payer: Self-pay

## 2024-02-21 VITALS — BP 124/63 | HR 65 | Ht 60.5 in | Wt 210.0 lb

## 2024-02-21 DIAGNOSIS — E119 Type 2 diabetes mellitus without complications: Secondary | ICD-10-CM | POA: Insufficient documentation

## 2024-02-21 DIAGNOSIS — Z9889 Other specified postprocedural states: Secondary | ICD-10-CM

## 2024-02-21 DIAGNOSIS — Z7982 Long term (current) use of aspirin: Secondary | ICD-10-CM | POA: Diagnosis not present

## 2024-02-21 DIAGNOSIS — Z79899 Other long term (current) drug therapy: Secondary | ICD-10-CM | POA: Diagnosis not present

## 2024-02-21 DIAGNOSIS — I110 Hypertensive heart disease with heart failure: Secondary | ICD-10-CM | POA: Diagnosis not present

## 2024-02-21 DIAGNOSIS — S0990XA Unspecified injury of head, initial encounter: Secondary | ICD-10-CM | POA: Insufficient documentation

## 2024-02-21 DIAGNOSIS — W19XXXA Unspecified fall, initial encounter: Secondary | ICD-10-CM

## 2024-02-21 DIAGNOSIS — J441 Chronic obstructive pulmonary disease with (acute) exacerbation: Secondary | ICD-10-CM | POA: Diagnosis not present

## 2024-02-21 DIAGNOSIS — W010XXA Fall on same level from slipping, tripping and stumbling without subsequent striking against object, initial encounter: Secondary | ICD-10-CM | POA: Diagnosis not present

## 2024-02-21 DIAGNOSIS — I5032 Chronic diastolic (congestive) heart failure: Secondary | ICD-10-CM | POA: Insufficient documentation

## 2024-02-21 DIAGNOSIS — Z794 Long term (current) use of insulin: Secondary | ICD-10-CM | POA: Insufficient documentation

## 2024-02-21 MED ORDER — HYDROCODONE-ACETAMINOPHEN 5-325 MG PO TABS
1.0000 | ORAL_TABLET | Freq: Once | ORAL | Status: AC
  Administered 2024-02-21: 1 via ORAL
  Filled 2024-02-21: qty 1

## 2024-02-21 MED ORDER — BUTALBITAL-APAP-CAFFEINE 50-325-40 MG PO TABS: 1.0000 | ORAL_TABLET | Freq: Four times a day (QID) | ORAL | 0 refills | Status: AC | PRN

## 2024-02-21 MED ORDER — LIDOCAINE 5 % EX PTCH: 1.0000 | MEDICATED_PATCH | Freq: Every day | CUTANEOUS | 0 refills | Status: AC | PRN

## 2024-02-21 MED ORDER — LIDOCAINE 5 % EX PTCH
1.0000 | MEDICATED_PATCH | Freq: Once | CUTANEOUS | Status: DC
  Administered 2024-02-21: 1 via TRANSDERMAL
  Filled 2024-02-21: qty 1

## 2024-02-21 NOTE — Progress Notes (Signed)
 Patient is a pleasant 58 year old female s/p panniculectomy performed 01/29/2024 by Dr. Montorfano who presents to clinic for postoperative follow-up.   She was seen most recently on 02/16/2024.  At that time, she had reported incisional odor and was cleaning the area with hand sanitizer and Clorox.  Daily drain output greater than 40 cc.  She was encouraged to clean her wound with Vashe wound cleanser rather than the product she had been utilizing.  She was also provided Xeroform for small superficial incisional wounds centrally.  There had been concern for DVT given reportedly chronic lower extremity asymmetry, but DVT ultrasound study was negative.  Today she is doing well from a postoperative standpoint.  She denies any chest pain, difficulty breathing, fevers, or new leg swelling.   Drain output from the remaining drain has been low, approximately 20 to 30 cc for the past few days.  On exam, abdomen is soft and nondistended.  No focal areas of tenderness.  No asymmetric swelling.  Incision CDI throughout with the exception of a 1 cm incisional wound centrally with some incisional slough noted.  No surrounding erythema or induration.  No evidence concerning for infection.  Remainder of exam is entirely benign.  Drain intact and functional with scant serous output in bulb.  Discussed case with Dr. Montorfano who personally evaluated patient.  Drain was removed without complication or difficulty.  Silver nitrate chemical cautery applied to areas of hypergranulation/slough.  Follow-up in 2 weeks for likely final postoperative encounter.  She can call if she has any questions or concerns before then.  Continue with activity modifications and compressive garments in interim.  Picture(s) obtained of the patient and placed in the chart were with the patient's or guardian's permission.

## 2024-02-21 NOTE — ED Notes (Signed)
 Pt/family received d/c paperwork at this time. After going over the paperwork any questions, comments, or concerns were answered to the best of this nurse's knowledge. The pt/family verbally acknowledged the teachings/instructions.

## 2024-02-21 NOTE — ED Triage Notes (Signed)
 Pt presents with injuries from a fall that happened on 12/21. She was trying to pass people on the sidewalk and tripped on the grass. Pt has been having a HA since the fall and just wanted to be evaluated. She denies any LOC, vomiting,  and she is not on anticoagulants.

## 2024-02-21 NOTE — Discharge Instructions (Signed)
 It was a pleasure caring for you today in the emergency department.  Based on the events which brought you to the ER today, it is possible that you may have a concussion. A concussion occurs when there is a blow to the head or body, with enough force to shake the brain and disrupt how the brain functions. You may experience symptoms such as headaches, sensitivity to light/noise, dizziness, cognitive slowing, difficulty concentrating / remembering, trouble sleeping and drowsiness. These symptoms may last anywhere from hours/days to potentially weeks/months. While these symptoms are very frustrating and perhaps debilitating, it is important that you remember that they will improve over time. Everyone has a different rate of recovery; it is difficult to predict when your symptoms will resolve. In order to allow for your brain to heal after the injury, we recommend that you see your primary physician or a physician knowledgeable in concussion management. We also advise you to let your body and brain rest: avoid physical activities (sports, gym, and exercise) and reduce cognitive demands (reading, texting, TV watching, computer use, video games, etc). School attendance, after-school activities and work may need to be modified to avoid increasing symptoms. We recommend against driving until until all symptoms have resolved. Come back to the ER right away if you are having repeated episodes of vomiting, severe/worsening headache/dizziness or any other symptom that alarms you. We recommended that someone stay with you for the next 24 hours to monitor for these worrisome symptoms.   Please return to the emergency department for any worsening or worrisome symptoms.

## 2024-02-21 NOTE — ED Provider Notes (Signed)
 " [place] [facility] AT [facility] Provider Note  CSN: [id number] Arrival date & time: 02/21/24 1830  Chief Complaint(s) Fall  HPI [name] is a 58 y.o. female with past medical history as below, significant for asthma, DM 2, CHF, hypertension, obesity who presents to the ED with complaint of head injury, headache  Patient reports she had a trip and fall around 3 days ago.  Hit the back of her head on the ground.  No LOC, no thinners.  She been having headache since then, difficulty concentrating.  No vision changes, no nausea or vomiting, no weakness to her extremities.  No repeat head injuries.  Not anticoagulated.  No chest pain or dyspnea, abdominal pain, nausea or vomiting.  No change in p.o. intake.  No significant relief with Tylenol   Past Medical History Past Medical History:  Diagnosis Date   Acute asthma exacerbation 12/21/2014   Acute renal failure    ARF (acute renal failure) 05/03/2014   Asthma    Asthma exacerbation 05/03/2014   Asthma, severe persistent (HCC) 05/03/2014   Chronic diastolic CHF (congestive heart failure) (HCC) 06/21/2018   Diabetes mellitus    Diabetes mellitus type 2 in obese 05/03/2014   History of echocardiogram 04/2014   LVH, diastolic dysfunction   Hyperlipidemia    Hypertension    Hyponatremia 12/02/2014   Obesity    Preop examination 05/18/2017   SOB (shortness of breath) 12/22/2014   Patient Active Problem List   Diagnosis Date Noted   Loop recorder Insertion of Lux-Dx Water Quality Scientist. Serial # [id number] for Lux DX heart failure trial 09/29/2021 09/29/2021   Chronic diastolic CHF (congestive heart failure) (HCC) 06/21/2018   Obesity 05/01/2018   Special screening for malignant neoplasms, colon    SOB (shortness of breath) 12/22/2014   Hyponatremia 12/02/2014   CKD (chronic kidney disease) stage 3, GFR 30-59 ml/min (HCC) 05/04/2014   Asthma exacerbation 05/03/2014   Type 2 diabetes  mellitus with obesity 05/03/2014   Hypertension 05/03/2014   Hyperlipidemia 05/03/2014   Home Medication(s) Prior to Admission medications  Medication Sig Start Date End Date Taking? Authorizing Provider  ACCU-CHEK GUIDE TEST test strip 2 (two) times daily. 09/26/23   [provider]  acetaminophen  (TYLENOL ) 500 MG tablet Take 1 tablet (500 mg total) by mouth every 6 (six) hours as needed. 01/12/24   Montorfano, Lisandro M, MD  albuterol  (PROVENTIL  HFA;VENTOLIN  HFA) 108 (90 BASE) MCG/ACT inhaler Inhale 2 puffs into the lungs every 6 (six) hours as needed for wheezing.    [provider]  albuterol  (PROVENTIL ) (2.5 MG/3ML) 0.083% nebulizer solution Take 3 mLs (2.5 mg total) by nebulization every 6 (six) hours as needed for wheezing or shortness of breath. 12/21/14   Joy, Shawn C, PA-C  amLODipine  (NORVASC ) 10 MG tablet Take 10 mg by mouth daily.    [provider]  amLODipine  (NORVASC ) 5 MG tablet TAKE ONE TABLET BY MOUTH ONCE DAILY. 12/17/18   Ladona Heinz, MD  aspirin  81 MG chewable tablet Chew 81 mg by mouth daily. 05/16/23   [provider]  atorvastatin  (LIPITOR) 40 MG tablet Take 40 mg by mouth daily.    [provider]  calcitRIOL (ROCALTROL) 0.25 MCG capsule Take 0.25 mcg by mouth daily.    [provider]  Calcium  Carbonate-Vitamin D  (CALCIUM  600+D PO) Take 1 tablet by mouth daily.    [provider]  carboxymethylcellulose (REFRESH PLUS) 0.5 % SOLN Place 1 drop into both eyes  3 (three) times daily as needed (dry eyes).    [provider]  carvedilol  (COREG ) 6.25 MG tablet Take 6.25 mg by mouth 2 (two) times daily.  11/08/13   [provider]  Cholecalciferol (VITAMIN D ) 2000 units tablet Take 2,000 Units by mouth daily.     [provider]  FARXIGA 5 MG TABS tablet Take 5 mg by mouth daily.    [provider]  gabapentin  (NEURONTIN ) 300 MG capsule Take 1 capsule (300 mg total) by mouth 4 (four)  times daily. Patient taking differently: Take 300 mg by mouth 2 (two) times daily. 05/13/19   Ladona Heinz, MD  HUMALOG  100 UNIT/ML injection Uses in insulin  pump 07/19/21   [provider]  hydrALAZINE  (APRESOLINE ) 25 MG tablet TAKE ONE TABLET BY MOUTH 3 TIMES A DAY 09/22/23   Tolia, Sunit, DO  hydrALAZINE  (APRESOLINE ) 50 MG tablet Take 50 mg by mouth 2 (two) times daily. 10/27/23   [provider]  Insulin  Human (INSULIN  PUMP) SOLN Inject into the skin.    [provider]  levothyroxine  (SYNTHROID , LEVOTHROID) 75 MCG tablet Take 75 mcg by mouth daily before breakfast.    [provider]  Lifitegrast (XIIDRA) 5 % SOLN Place 1 drop into both eyes in the morning and at bedtime.    [provider]  montelukast  (SINGULAIR ) 10 MG tablet Take 10 mg by mouth at bedtime.    [provider]  Multiple Vitamins-Minerals (ADULT GUMMY PO) Take 1 capsule by mouth daily.    [provider]  nystatin  (MYCOSTATIN /NYSTOP ) powder Apply 1 Application topically 3 (three) times daily. 11/21/22   Jayne Vonn DEL, MD  ondansetron  (ZOFRAN -ODT) 4 MG disintegrating tablet Take 1 tablet (4 mg total) by mouth every 8 (eight) hours as needed for nausea or vomiting. 01/12/24   Montorfano, Lisandro M, MD  oxyCODONE  (ROXICODONE ) 5 MG immediate release tablet Take 1 tablet (5 mg total) by mouth every 4 (four) hours as needed for severe pain (pain score 7-10). 01/12/24   Montorfano, Lisandro M, MD  Semaglutide , 2 MG/DOSE, (OZEMPIC , 2 MG/DOSE,) 8 MG/3ML SOPN INJECT 2MG  INTO THE SKIN ONCE A WEEK 03/09/23   Ladona Heinz, MD  torsemide  (DEMADEX ) 20 MG tablet Take 20 mg by mouth in the morning, at noon, and at bedtime.    [provider]                                                                                                                                    Past Surgical History Past Surgical History:  Procedure Laterality Date   CARDIAC CATHETERIZATION  03/2013    normal coronary arteries, EF 55%   CARDIAC CATHETERIZATION  03/2013   normal coronary arteries   CATARACT EXTRACTION W/PHACO Right 12/10/2012   Procedure: CATARACT EXTRACTION PHACO AND INTRAOCULAR LENS PLACEMENT (IOC);  Surgeon: Cherene Mania, MD;  Location: [facility];  Service: Ophthalmology;  Laterality: Right;  CDE:22..42   CATARACT EXTRACTION W/PHACO Left 05/11/2015   Procedure: CATARACT EXTRACTION PHACO AND INTRAOCULAR LENS PLACEMENT LEFT EYE CDE=6.54;  Surgeon: Cherene Mania, MD;  Location: [facility];  Service: Ophthalmology;  Laterality: Left;   COLONOSCOPY N/A 09/22/2017   Procedure: COLONOSCOPY;  Surgeon: Harvey Margo CROME, MD;  Location: [facility];  Service: Endoscopy;  Laterality: N/A;  12:15   COLONOSCOPY WITH PROPOFOL  N/A 09/27/2021   Procedure: COLONOSCOPY WITH PROPOFOL ;  Surgeon: Cindie Carlin POUR, DO;  Location: [facility];  Service: Endoscopy;  Laterality: N/A;  8:15am   EYE SURGERY Left    GASTRIC BYPASS  05/01/2018   GASTRIC ROUX-EN-Y N/A 05/01/2018   Procedure: LAPAROSCOPIC ROUX-EN-Y GASTRIC BYPASS WITH UPPER ENDOSCOPY WITH ERAS PATHWAY;  Surgeon: Kinsinger, Herlene Righter, MD;  Location: [facility];  Service: General;  Laterality: N/A;   LEFT HEART CATHETERIZATION WITH CORONARY ANGIOGRAM N/A 04/09/2013   Procedure: LEFT HEART CATHETERIZATION WITH CORONARY ANGIOGRAM;  Surgeon: Erick JONELLE Bergamo, MD;  Location: [facility] CATH LAB;  Service: Cardiovascular;  Laterality: N/A;   Loop recorder implantation     Insertion of Lux-Dx Water Quality Scientist. Serial # [id number] 09/29/21 for heart failure research   PANNICULECTOMY Bilateral 01/29/2024   Procedure: PANNICULECTOMY;  Surgeon: Montorfano, Lisandro M, MD;  Location: [facility] OR;  Service: Plastics;  Laterality: Bilateral;   TRACHEOSTOMY     at age 44 from asthma attack   Family History Family History  Problem Relation Age of Onset   Diabetes Mother    Asthma Mother    Bronchitis Mother    Colon polyps Father    Hypertension Sister    Asthma  Other    Hypertension Other    Colon cancer Neg Hx     Social History Social History[1] Allergies Patient has no known allergies.  Review of Systems A thorough review of systems was obtained and all systems are negative except as noted in the HPI and PMH.   Physical Exam Vital Signs  I have reviewed the triage vital signs BP 127/60 (BP Location: Right Arm)   Pulse 66   Temp 98.5 F (36.9 C) (Oral)   Resp 16   Ht 5' 5 (1.651 m)   Wt 95.3 kg   LMP 07/29/2013   SpO2 98%   BMI 34.95 kg/m  Physical Exam Vitals and nursing note reviewed.  Constitutional:      General: She is not in acute distress.    Appearance: Normal appearance. She is well-developed. She is not ill-appearing.  HENT:     Head: Normocephalic and atraumatic.     Right Ear: External ear normal.     Left Ear: External ear normal.     Nose: Nose normal.     Mouth/Throat:     Mouth: Mucous membranes are moist.  Eyes:     General: No scleral icterus.       Right eye: No discharge.        Left eye: No discharge.     Extraocular Movements: Extraocular movements intact.     Pupils: Pupils are equal, round, and reactive to light.  Cardiovascular:     Rate and Rhythm: Normal rate.  Pulmonary:     Effort: Pulmonary effort is normal. No respiratory distress.     Breath sounds: No stridor.  Abdominal:     General: Abdomen is flat. There is no distension.     Tenderness: There is no guarding.  Musculoskeletal:        General: No deformity.  Cervical back: No rigidity.  Skin:    General: Skin is warm and dry.     Coloration: Skin is not cyanotic, jaundiced or pale.  Neurological:     Mental Status: She is alert and oriented to person, place, and time.     GCS: GCS eye subscore is 4. GCS verbal subscore is 5. GCS motor subscore is 6.     Cranial Nerves: Cranial nerves 2-12 are intact. No dysarthria or facial asymmetry.     Sensory: Sensation is intact. No sensory deficit.     Motor: Motor function is  intact. No tremor.     Coordination: Coordination is intact.     Gait: Gait is intact.     Comments: Strength 5/5 to BLUE/BLLE, equal and symmetric    Psychiatric:        Speech: Speech normal.        Behavior: Behavior normal. Behavior is cooperative.     ED Results and Treatments Labs (all labs ordered are listed, but only abnormal results are displayed) Labs Reviewed - No data to display                                                                                                                        Radiology CT Head Wo Contrast Result Date: 02/21/2024 CLINICAL DATA:  Headache EXAM: CT HEAD WITHOUT CONTRAST TECHNIQUE: Contiguous axial images were obtained from the base of the skull through the vertex without intravenous contrast. RADIATION DOSE REDUCTION: This exam was performed according to the departmental dose-optimization program which includes automated exposure control, adjustment of the mA and/or kV according to patient size and/or use of iterative reconstruction technique. COMPARISON:  None Available. FINDINGS: Brain: No evidence of acute infarction, hemorrhage, hydrocephalus, extra-axial collection or mass lesion/mass effect. Vascular: No hyperdense vessel or unexpected calcification. Skull: Normal. Negative for fracture or focal lesion. Sinuses/Orbits: Mild mucosal thickening in the ethmoid sinuses Other: None IMPRESSION: Negative non contrasted CT appearance of the brain. Electronically Signed   By: Luke Bun M.D.   On: 02/21/2024 19:35    Pertinent labs & imaging results that were available during my care of the patient were reviewed by me and considered in my medical decision making (see MDM for details).  Medications Ordered in ED Medications  HYDROcodone -acetaminophen  (NORCO/VICODIN) 5-325 MG per tablet 1 tablet (has no administration in time range)  lidocaine  (LIDODERM ) 5 % 1 patch (has no administration in time range)  Procedures Procedures  (including critical care time)  Medical Decision Making / ED Course    Medical Decision Making:    KATARINA RIEBE is a 58 y.o. female with past medical history as below, significant for asthma, DM 2, CHF, hypertension, obesity who presents to the ED with complaint of head injury, headache. The complaint involves an extensive differential diagnosis and also carries with it a high risk of complications and morbidity.  Serious etiology was considered. Ddx includes but is not limited to: Differential diagnoses for head trauma includes subdural hematoma, epidural hematoma, acute concussion, traumatic subarachnoid hemorrhage, cerebral contusions, etc.   Complete initial physical exam performed, notably the patient was in no acute distress, neuro intact.    Reviewed and confirmed nursing documentation for past medical history, family history, social history.  Vital signs reviewed.    Head injury Concussion> - Patient with mechanical trip and fall.  Head injury.  No LOC, no thinners - CT head stable - Neuroexam is intact.  She is ambulatory.  Tolerant p.o. without difficulty.  No vomiting.  No vision changes.  No external evidence of trauma - May have a concussion.  Provided concussion precautions, f/u w/ pcp  8:44 PM:  I have discussed the diagnosis/risks/treatment options with the patient.  Evaluation and diagnostic testing in the emergency department does not suggest an emergent condition requiring admission or immediate intervention beyond what has been performed at this time.  They will follow up with PC. We also discussed returning to the ED immediately if new or worsening sx occur. We discussed the sx which are most concerning (e.g., sudden worsening pain, fever, inability to tolerate by mouth) that necessitate immediate return.    The patient appears reasonably screened  and/or stabilized for discharge and I doubt any other medical condition or other [facility] requiring further screening, evaluation, or treatment in the ED at this time prior to discharge.                        Additional history obtained: -Additional history obtained from na -External records from outside source obtained and reviewed including: Chart review including previous notes, labs, imaging, consultation notes including  Recent office visit plastic surg   Lab Tests: na  EKG   EKG Interpretation Date/Time:    Ventricular Rate:    PR Interval:    QRS Duration:    QT Interval:    QTC Calculation:   R Axis:      Text Interpretation:           Imaging Studies ordered: I ordered imaging studies including CTH I independently visualized the following imaging with scope of interpretation limited to determining acute life threatening conditions related to emergency care; findings noted above I agree with the radiologist interpretation If any imaging was obtained with contrast I closely monitored patient for any possible adverse reaction a/w contrast administration in the emergency department   Medicines ordered and prescription drug management: Meds ordered this encounter  Medications   HYDROcodone -acetaminophen  (NORCO/VICODIN) 5-325 MG per tablet 1 tablet    Refill:  0   lidocaine  (LIDODERM ) 5 % 1 patch    -I have reviewed the patients home medicines and have made adjustments as needed   Consultations Obtained: na   Cardiac Monitoring: Continuous pulse oximetry interpreted by myself, 100% on RA.    Social Determinants of Health:  Diagnosis or treatment significantly limited by social determinants of health: obesity   Reevaluation: After the interventions  noted above, I reevaluated the patient and found that they have improved  Co morbidities that complicate the patient evaluation  Past Medical History:  Diagnosis Date   Acute asthma  exacerbation 12/21/2014   Acute renal failure    ARF (acute renal failure) 05/03/2014   Asthma    Asthma exacerbation 05/03/2014   Asthma, severe persistent (HCC) 05/03/2014   Chronic diastolic CHF (congestive heart failure) (HCC) 06/21/2018   Diabetes mellitus    Diabetes mellitus type 2 in obese 05/03/2014   History of echocardiogram 04/2014   LVH, diastolic dysfunction   Hyperlipidemia    Hypertension    Hyponatremia 12/02/2014   Obesity    Preop examination 05/18/2017   SOB (shortness of breath) 12/22/2014      Dispostion: Disposition decision including need for hospitalization was considered, and patient discharged from emergency department.    Final Clinical Impression(s) / ED Diagnoses Final diagnoses:  Minor head injury, initial encounter  Fall, initial encounter         [1]  Social History Tobacco Use   Smoking status: Never    Passive exposure: Never   Smokeless tobacco: Never  Vaping Use   Vaping status: Never Used  Substance Use Topics   Alcohol use: Yes    Comment: occasionally   Drug use: No     Elnor Jayson LABOR, DO 02/21/24 2044  "

## 2024-02-23 ENCOUNTER — Telehealth: Payer: Self-pay | Admitting: *Deleted

## 2024-02-23 NOTE — Telephone Encounter (Addendum)
 Faxed order to [facility] [employer] [facility] for supplies for the patient.  Supplies:ABD Pads:Daily                Xeroform:Daily                Sterile Gauze-4x4:Daily  Confirmation received and copy scanned into the chart.//AR/CMA    Received Urgent: Documentation Needed   Attention:There are some issues delaying service  to your patient.  Please provide patient demographics that include a physical address, phone number and date of birth so that [facility]  may complete the intake process and provide service.  The information been requested has been faxed to [facility] [employer] [facility].  Copy scanned into the chart.//AR/CMA

## 2024-02-27 NOTE — Telephone Encounter (Signed)
 Pt stated at her appt this morning she was told to get scans done of her legs for suspected DVT. She wants to know where to go and if there is an order in the system. Please advise.

## 2024-03-03 ENCOUNTER — Emergency Department (HOSPITAL_COMMUNITY)

## 2024-03-03 ENCOUNTER — Other Ambulatory Visit: Payer: Self-pay

## 2024-03-03 ENCOUNTER — Inpatient Hospital Stay (HOSPITAL_COMMUNITY)
Admission: EM | Admit: 2024-03-03 | Discharge: 2024-03-08 | DRG: 871 | Disposition: A | Discharge status: Home / Self Care | Attending: Family Medicine | Admitting: Family Medicine

## 2024-03-03 ENCOUNTER — Encounter (HOSPITAL_COMMUNITY): Payer: Self-pay | Admitting: Emergency Medicine

## 2024-03-03 DIAGNOSIS — Z6839 Body mass index (BMI) 39.0-39.9, adult: Secondary | ICD-10-CM

## 2024-03-03 DIAGNOSIS — Z7982 Long term (current) use of aspirin: Secondary | ICD-10-CM | POA: Diagnosis not present

## 2024-03-03 DIAGNOSIS — Z7985 Long-term (current) use of injectable non-insulin antidiabetic drugs: Secondary | ICD-10-CM | POA: Diagnosis not present

## 2024-03-03 DIAGNOSIS — D6959 Other secondary thrombocytopenia: Secondary | ICD-10-CM | POA: Diagnosis present

## 2024-03-03 DIAGNOSIS — E1122 Type 2 diabetes mellitus with diabetic chronic kidney disease: Secondary | ICD-10-CM | POA: Diagnosis present

## 2024-03-03 DIAGNOSIS — Z825 Family history of asthma and other chronic lower respiratory diseases: Secondary | ICD-10-CM

## 2024-03-03 DIAGNOSIS — Z79899 Other long term (current) drug therapy: Secondary | ICD-10-CM | POA: Diagnosis not present

## 2024-03-03 DIAGNOSIS — E039 Hypothyroidism, unspecified: Secondary | ICD-10-CM | POA: Diagnosis present

## 2024-03-03 DIAGNOSIS — I130 Hypertensive heart and chronic kidney disease with heart failure and stage 1 through stage 4 chronic kidney disease, or unspecified chronic kidney disease: Secondary | ICD-10-CM | POA: Diagnosis present

## 2024-03-03 DIAGNOSIS — E6609 Other obesity due to excess calories: Secondary | ICD-10-CM

## 2024-03-03 DIAGNOSIS — J1000 Influenza due to other identified influenza virus with unspecified type of pneumonia: Secondary | ICD-10-CM | POA: Diagnosis present

## 2024-03-03 DIAGNOSIS — E11649 Type 2 diabetes mellitus with hypoglycemia without coma: Secondary | ICD-10-CM | POA: Diagnosis present

## 2024-03-03 DIAGNOSIS — J189 Pneumonia, unspecified organism: Secondary | ICD-10-CM

## 2024-03-03 DIAGNOSIS — Z794 Long term (current) use of insulin: Secondary | ICD-10-CM | POA: Diagnosis not present

## 2024-03-03 DIAGNOSIS — J9601 Acute respiratory failure with hypoxia: Principal | ICD-10-CM | POA: Diagnosis present

## 2024-03-03 DIAGNOSIS — J4551 Severe persistent asthma with (acute) exacerbation: Secondary | ICD-10-CM | POA: Diagnosis present

## 2024-03-03 DIAGNOSIS — Z79890 Hormone replacement therapy (postmenopausal): Secondary | ICD-10-CM

## 2024-03-03 DIAGNOSIS — R339 Retention of urine, unspecified: Secondary | ICD-10-CM | POA: Diagnosis present

## 2024-03-03 DIAGNOSIS — I2489 Other forms of acute ischemic heart disease: Secondary | ICD-10-CM | POA: Diagnosis present

## 2024-03-03 DIAGNOSIS — A4189 Other specified sepsis: Secondary | ICD-10-CM | POA: Diagnosis present

## 2024-03-03 DIAGNOSIS — E66812 Obesity, class 2: Secondary | ICD-10-CM | POA: Diagnosis present

## 2024-03-03 DIAGNOSIS — J440 Chronic obstructive pulmonary disease with acute lower respiratory infection: Secondary | ICD-10-CM | POA: Diagnosis present

## 2024-03-03 DIAGNOSIS — J45901 Unspecified asthma with (acute) exacerbation: Secondary | ICD-10-CM

## 2024-03-03 DIAGNOSIS — N1832 Chronic kidney disease, stage 3b: Secondary | ICD-10-CM | POA: Diagnosis present

## 2024-03-03 DIAGNOSIS — R6520 Severe sepsis without septic shock: Secondary | ICD-10-CM | POA: Diagnosis present

## 2024-03-03 DIAGNOSIS — I5033 Acute on chronic diastolic (congestive) heart failure: Secondary | ICD-10-CM | POA: Diagnosis present

## 2024-03-03 DIAGNOSIS — R0602 Shortness of breath: Secondary | ICD-10-CM | POA: Diagnosis present

## 2024-03-03 DIAGNOSIS — J441 Chronic obstructive pulmonary disease with (acute) exacerbation: Secondary | ICD-10-CM | POA: Diagnosis present

## 2024-03-03 DIAGNOSIS — Z8249 Family history of ischemic heart disease and other diseases of the circulatory system: Secondary | ICD-10-CM | POA: Diagnosis not present

## 2024-03-03 DIAGNOSIS — Z833 Family history of diabetes mellitus: Secondary | ICD-10-CM

## 2024-03-03 DIAGNOSIS — E785 Hyperlipidemia, unspecified: Secondary | ICD-10-CM | POA: Diagnosis present

## 2024-03-03 DIAGNOSIS — Z9641 Presence of insulin pump (external) (internal): Secondary | ICD-10-CM | POA: Diagnosis present

## 2024-03-03 DIAGNOSIS — I10 Essential (primary) hypertension: Secondary | ICD-10-CM

## 2024-03-03 DIAGNOSIS — Z9884 Bariatric surgery status: Secondary | ICD-10-CM

## 2024-03-03 LAB — BLOOD GAS, VENOUS
Acid-Base Excess: 3.1 mmol/L — ABNORMAL HIGH (ref 0.0–2.0)
Bicarbonate: 29.5 mmol/L — ABNORMAL HIGH (ref 20.0–28.0)
Drawn by: 65579
O2 Saturation: 31.5 %
Patient temperature: 38.7
pCO2, Ven: 55 mmHg (ref 44–60)
pH, Ven: 7.35 (ref 7.25–7.43)
pO2, Ven: <31 mmHg — CL (ref 32–45)

## 2024-03-03 LAB — CBC WITH DIFFERENTIAL/PLATELET
Abs Immature Granulocytes: 0.05 K/uL (ref 0.00–0.07)
Basophils Absolute: 0 K/uL (ref 0.0–0.1)
Basophils Relative: 0 %
Eosinophils Absolute: 0 K/uL (ref 0.0–0.5)
Eosinophils Relative: 0 %
HCT: 39.9 % (ref 36.0–46.0)
Hemoglobin: 11.9 g/dL — ABNORMAL LOW (ref 12.0–15.0)
Immature Granulocytes: 1 %
Lymphocytes Relative: 13 %
Lymphs Abs: 1 K/uL (ref 0.7–4.0)
MCH: 26.3 pg (ref 26.0–34.0)
MCHC: 29.8 g/dL — ABNORMAL LOW (ref 30.0–36.0)
MCV: 88.1 fL (ref 80.0–100.0)
Monocytes Absolute: 0.3 K/uL (ref 0.1–1.0)
Monocytes Relative: 4 %
Neutro Abs: 6.2 K/uL (ref 1.7–7.7)
Neutrophils Relative %: 82 %
Platelets: 123 K/uL — ABNORMAL LOW (ref 150–400)
RBC: 4.53 MIL/uL (ref 3.87–5.11)
RDW: 14.4 % (ref 11.5–15.5)
WBC: 7.6 K/uL (ref 4.0–10.5)
nRBC: 0 % (ref 0.0–0.2)

## 2024-03-03 LAB — COMPREHENSIVE METABOLIC PANEL WITH GFR
ALT: 30 U/L (ref 0–44)
AST: 62 U/L — ABNORMAL HIGH (ref 15–41)
Albumin: 3.7 g/dL (ref 3.5–5.0)
Alkaline Phosphatase: 120 U/L (ref 38–126)
Anion gap: 16 — ABNORMAL HIGH (ref 5–15)
BUN: 26 mg/dL — ABNORMAL HIGH (ref 6–20)
CO2: 22 mmol/L (ref 22–32)
Calcium: 8.5 mg/dL — ABNORMAL LOW (ref 8.9–10.3)
Chloride: 103 mmol/L (ref 98–111)
Creatinine, Ser: 1.91 mg/dL — ABNORMAL HIGH (ref 0.44–1.00)
GFR, Estimated: 30 mL/min — ABNORMAL LOW
Glucose, Bld: 65 mg/dL — ABNORMAL LOW (ref 70–99)
Potassium: 4.2 mmol/L (ref 3.5–5.1)
Sodium: 141 mmol/L (ref 135–145)
Total Bilirubin: 0.3 mg/dL (ref 0.0–1.2)
Total Protein: 7.3 g/dL (ref 6.5–8.1)

## 2024-03-03 LAB — RESP PANEL BY RT-PCR (RSV, FLU A&B, COVID)  RVPGX2
Influenza A by PCR: POSITIVE — AB
Influenza B by PCR: NEGATIVE
Resp Syncytial Virus by PCR: NEGATIVE
SARS Coronavirus 2 by RT PCR: NEGATIVE

## 2024-03-03 LAB — TROPONIN T, HIGH SENSITIVITY
Troponin T High Sensitivity: 66 ng/L — ABNORMAL HIGH (ref 0–19)
Troponin T High Sensitivity: 72 ng/L — ABNORMAL HIGH (ref 0–19)

## 2024-03-03 LAB — C DIFFICILE QUICK SCREEN W PCR REFLEX
C Diff antigen: NEGATIVE
C Diff interpretation: NOT DETECTED
C Diff toxin: NEGATIVE

## 2024-03-03 LAB — PRO BRAIN NATRIURETIC PEPTIDE: Pro Brain Natriuretic Peptide: 7225 pg/mL — ABNORMAL HIGH

## 2024-03-03 LAB — LACTIC ACID, PLASMA: Lactic Acid, Venous: 1.6 mmol/L (ref 0.5–1.9)

## 2024-03-03 LAB — CBG MONITORING, ED: Glucose-Capillary: 69 mg/dL — ABNORMAL LOW (ref 70–99)

## 2024-03-03 MED ORDER — METHYLPREDNISOLONE SODIUM SUCC 125 MG IJ SOLR
125.0000 mg | Freq: Once | INTRAMUSCULAR | Status: AC
  Administered 2024-03-03: 125 mg via INTRAVENOUS
  Filled 2024-03-03: qty 2

## 2024-03-03 MED ORDER — LINEZOLID 600 MG/300ML IV SOLN
600.0000 mg | Freq: Two times a day (BID) | INTRAVENOUS | Status: DC
  Administered 2024-03-03 – 2024-03-04 (×2): 600 mg via INTRAVENOUS
  Filled 2024-03-03 (×5): qty 300

## 2024-03-03 MED ORDER — MAGNESIUM SULFATE 2 GM/50ML IV SOLN
2.0000 g | Freq: Once | INTRAVENOUS | Status: AC
  Administered 2024-03-03: 2 g via INTRAVENOUS
  Filled 2024-03-03: qty 50

## 2024-03-03 MED ORDER — SODIUM CHLORIDE 0.9 % IV SOLN
500.0000 mg | INTRAVENOUS | Status: DC
  Administered 2024-03-03 – 2024-03-05 (×3): 500 mg via INTRAVENOUS
  Filled 2024-03-03 (×3): qty 5

## 2024-03-03 MED ORDER — ACETAMINOPHEN 500 MG PO TABS
500.0000 mg | ORAL_TABLET | Freq: Four times a day (QID) | ORAL | Status: AC | PRN
  Administered 2024-03-05 – 2024-03-08 (×4): 500 mg via ORAL
  Filled 2024-03-03 (×4): qty 1

## 2024-03-03 MED ORDER — ACETAMINOPHEN 10 MG/ML IV SOLN
1000.0000 mg | Freq: Four times a day (QID) | INTRAVENOUS | Status: AC
  Administered 2024-03-03 – 2024-03-04 (×4): 1000 mg via INTRAVENOUS
  Filled 2024-03-03 (×6): qty 100

## 2024-03-03 MED ORDER — CHLORHEXIDINE GLUCONATE CLOTH 2 % EX PADS
6.0000 | MEDICATED_PAD | Freq: Every day | CUTANEOUS | Status: DC
  Administered 2024-03-04 – 2024-03-08 (×5): 6 via TOPICAL

## 2024-03-03 MED ORDER — SODIUM CHLORIDE 0.9 % IV BOLUS
1000.0000 mL | Freq: Once | INTRAVENOUS | Status: AC
  Administered 2024-03-03: 1000 mL via INTRAVENOUS

## 2024-03-03 MED ORDER — OSELTAMIVIR PHOSPHATE 75 MG PO CAPS
75.0000 mg | ORAL_CAPSULE | Freq: Once | ORAL | Status: DC
  Filled 2024-03-03: qty 1

## 2024-03-03 MED ORDER — SODIUM CHLORIDE 0.9 % IV SOLN
2.0000 g | Freq: Once | INTRAVENOUS | Status: AC
  Administered 2024-03-03: 2 g via INTRAVENOUS
  Filled 2024-03-03: qty 20

## 2024-03-03 MED ORDER — PIPERACILLIN-TAZOBACTAM 3.375 G IVPB
3.3750 g | Freq: Three times a day (TID) | INTRAVENOUS | Status: AC
  Administered 2024-03-03 – 2024-03-06 (×10): 3.375 g via INTRAVENOUS
  Filled 2024-03-03 (×10): qty 50

## 2024-03-03 MED ORDER — MELATONIN 3 MG PO TABS
6.0000 mg | ORAL_TABLET | Freq: Every evening | ORAL | Status: DC | PRN
  Filled 2024-03-03: qty 2

## 2024-03-03 MED ORDER — ENOXAPARIN SODIUM 40 MG/0.4ML IJ SOSY
40.0000 mg | PREFILLED_SYRINGE | INTRAMUSCULAR | Status: DC
  Administered 2024-03-03 – 2024-03-07 (×5): 40 mg via SUBCUTANEOUS
  Filled 2024-03-03 (×5): qty 0.4

## 2024-03-03 MED ORDER — POLYETHYLENE GLYCOL 3350 17 G PO PACK: 17.0000 g | PACK | Freq: Every day | ORAL | Status: DC | PRN

## 2024-03-03 MED ORDER — DEXTROSE 50 % IV SOLN
12.5000 g | Freq: Once | INTRAVENOUS | Status: AC
  Administered 2024-03-03: 12.5 g via INTRAVENOUS
  Filled 2024-03-03: qty 50

## 2024-03-03 MED ORDER — IPRATROPIUM-ALBUTEROL 0.5-2.5 (3) MG/3ML IN SOLN
3.0000 mL | Freq: Once | RESPIRATORY_TRACT | Status: AC
  Administered 2024-03-03: 3 mL via RESPIRATORY_TRACT
  Filled 2024-03-03: qty 3

## 2024-03-03 MED ORDER — PROCHLORPERAZINE EDISYLATE 10 MG/2ML IJ SOLN
5.0000 mg | Freq: Four times a day (QID) | INTRAMUSCULAR | Status: DC | PRN
  Administered 2024-03-04 – 2024-03-05 (×2): 5 mg via INTRAVENOUS
  Filled 2024-03-03 (×2): qty 2

## 2024-03-03 NOTE — ED Triage Notes (Signed)
 Pt c/o sob for a few days and has been doing breathing treatments and using inhaler at home with no relief.

## 2024-03-03 NOTE — ED Provider Notes (Signed)
 " [place] EMERGENCY DEPARTMENT AT [facility] Provider Note  CSN: [id number] Arrival date & time: 03/03/24 1608  Chief Complaint(s) Shortness of Breath  HPI [name] is a 59 y.o. female history of COPD, CHF, obesity,, hypertension, hyperlipidemia presenting to the emergency department with shortness of breath.  Patient reports that she has been having shortness of breath for a few days, cough, weakness, dizziness.  Also having profuse diarrhea.  No abdominal pain or chest pain.  She has been using her home breathing treatment but without much improvement.  Apparently when paramedics arrived patient was hypoxic to 40% on room air.  She was started on nonrebreather and brought to emergency department.   Past Medical History Past Medical History:  Diagnosis Date   Acute asthma exacerbation 12/21/2014   Acute renal failure    ARF (acute renal failure) 05/03/2014   Asthma    Asthma exacerbation 05/03/2014   Asthma, severe persistent (HCC) 05/03/2014   Chronic diastolic CHF (congestive heart failure) (HCC) 06/21/2018   Diabetes mellitus    Diabetes mellitus type 2 in obese 05/03/2014   History of echocardiogram 04/2014   LVH, diastolic dysfunction   Hyperlipidemia    Hypertension    Hyponatremia 12/02/2014   Obesity    Preop examination 05/18/2017   SOB (shortness of breath) 12/22/2014   Patient Active Problem List   Diagnosis Date Noted   Acute hypoxic respiratory failure (HCC) 03/03/2024   Loop recorder Insertion of Lux-Dx [employer]. Serial # [id number] for Lux DX heart failure trial 09/29/2021 09/29/2021   Chronic diastolic CHF (congestive heart failure) (HCC) 06/21/2018   Obesity 05/01/2018   Special screening for malignant neoplasms, colon    SOB (shortness of breath) 12/22/2014   Hyponatremia 12/02/2014   CKD (chronic kidney disease) stage 3, GFR 30-59 ml/min (HCC) 05/04/2014   Asthma exacerbation 05/03/2014   Type 2 diabetes  mellitus with obesity 05/03/2014   Hypertension 05/03/2014   Hyperlipidemia 05/03/2014   Home Medication(s) Prior to Admission medications  Medication Sig Start Date End Date Taking? Authorizing Provider  ACCU-CHEK GUIDE TEST test strip 2 (two) times daily. 09/26/23   [provider]  acetaminophen  (TYLENOL ) 500 MG tablet Take 1 tablet (500 mg total) by mouth every 6 (six) hours as needed. 01/12/24   Montorfano, Lisandro M, MD  albuterol  (PROVENTIL  HFA;VENTOLIN  HFA) 108 (90 BASE) MCG/ACT inhaler Inhale 2 puffs into the lungs every 6 (six) hours as needed for wheezing.    [provider]  albuterol  (PROVENTIL ) (2.5 MG/3ML) 0.083% nebulizer solution Take 3 mLs (2.5 mg total) by nebulization every 6 (six) hours as needed for wheezing or shortness of breath. 12/21/14   Joy, Shawn C, PA-C  amLODipine  (NORVASC ) 10 MG tablet Take 10 mg by mouth daily.    [provider]  amLODipine  (NORVASC ) 5 MG tablet TAKE ONE TABLET BY MOUTH ONCE DAILY. 12/17/18   Ladona Heinz, MD  aspirin  81 MG chewable tablet Chew 81 mg by mouth daily. 05/16/23   [provider]  atorvastatin  (LIPITOR) 40 MG tablet Take 40 mg by mouth daily.    [provider]  butalbital -acetaminophen -caffeine  (FIORICET) 50-325-40 MG tablet Take 1 tablet by mouth every 6 (six) hours as needed for headache. 02/21/24 02/20/25  Elnor Jayson LABOR, DO  calcitRIOL (ROCALTROL) 0.25 MCG capsule Take 0.25 mcg by mouth daily.    [provider]  Calcium  Carbonate-Vitamin D  (CALCIUM  600+D PO) Take 1 tablet by mouth daily.    [provider]  carboxymethylcellulose (REFRESH PLUS) 0.5 % SOLN Place 1 drop into both eyes 3 (three) times daily as needed (dry eyes).    [provider]  carvedilol  (COREG ) 6.25 MG tablet Take 6.25 mg by mouth 2 (two) times daily.  11/08/13   [provider]  Cholecalciferol (VITAMIN D ) 2000 units tablet Take 2,000 Units by mouth daily.     [provider]  FARXIGA 5 MG TABS tablet Take 5 mg by mouth daily.    [provider]  gabapentin  (NEURONTIN ) 300 MG capsule Take 1 capsule (300 mg total) by mouth 4 (four) times daily. Patient taking differently: Take 300 mg by mouth 2 (two) times daily. 05/13/19   Ladona Heinz, MD  HUMALOG  100 UNIT/ML injection Uses in insulin  pump 07/19/21   [provider]  hydrALAZINE  (APRESOLINE ) 25 MG tablet TAKE ONE TABLET BY MOUTH 3 TIMES A DAY 09/22/23   Tolia, Sunit, DO  hydrALAZINE  (APRESOLINE ) 50 MG tablet Take 50 mg by mouth 2 (two) times daily. 10/27/23   [provider]  Insulin  Human (INSULIN  PUMP) SOLN Inject into the skin.    [provider]  levothyroxine  (SYNTHROID , LEVOTHROID) 75 MCG tablet Take 75 mcg by mouth daily before breakfast.    [provider]  lidocaine  (LIDODERM ) 5 % Place 1 patch onto the skin daily as needed. Remove & Discard patch within 12 hours or as directed by MD 02/21/24   Elnor Savant A, DO  Lifitegrast  (XIIDRA ) 5 % SOLN Place 1 drop into both eyes in the morning and at bedtime.    [provider]  montelukast  (SINGULAIR ) 10 MG tablet Take 10 mg by mouth at bedtime.    [provider]  Multiple Vitamins-Minerals (ADULT GUMMY PO) Take 1 capsule by mouth daily.    [provider]  nystatin  (MYCOSTATIN /NYSTOP ) powder Apply 1 Application topically 3 (three) times daily. 11/21/22   Jayne Vonn DEL, MD  ondansetron  (ZOFRAN -ODT) 4 MG disintegrating tablet Take 1 tablet (4 mg total) by mouth every 8 (eight) hours as needed for nausea or vomiting. 01/12/24   Montorfano, Lisandro M, MD  oxyCODONE  (ROXICODONE ) 5 MG immediate release tablet Take 1 tablet (5 mg total) by mouth every 4 (four) hours as needed for severe pain (pain score 7-10). 01/12/24   Montorfano, Lisandro M, MD  Semaglutide , 2 MG/DOSE, (OZEMPIC , 2 MG/DOSE,) 8 MG/3ML SOPN INJECT 2MG  INTO THE SKIN ONCE A WEEK 03/09/23   Ladona Heinz, MD  torsemide  (DEMADEX ) 20 MG tablet  Take 20 mg by mouth in the morning, at noon, and at bedtime.    [provider]                                                                                                                                    Past Surgical History Past Surgical History:  Procedure Laterality Date   CARDIAC CATHETERIZATION  03/2013   normal coronary  arteries, EF 55%   CARDIAC CATHETERIZATION  03/2013   normal coronary arteries   CATARACT EXTRACTION W/PHACO Right 12/10/2012   Procedure: CATARACT EXTRACTION PHACO AND INTRAOCULAR LENS PLACEMENT (IOC);  Surgeon: Cherene Mania, MD;  Location: [facility]S;  Service: Ophthalmology;  Laterality: Right;  CDE:22..42   CATARACT EXTRACTION W/PHACO Left 05/11/2015   Procedure: CATARACT EXTRACTION PHACO AND INTRAOCULAR LENS PLACEMENT LEFT EYE CDE=6.54;  Surgeon: Cherene Mania, MD;  Location: [facility];  Service: Ophthalmology;  Laterality: Left;   COLONOSCOPY N/A 09/22/2017   Procedure: COLONOSCOPY;  Surgeon: Harvey Margo CROME, MD;  Location: [facility] SUITE;  Service: Endoscopy;  Laterality: N/A;  12:15   COLONOSCOPY WITH PROPOFOL  N/A 09/27/2021   Procedure: COLONOSCOPY WITH PROPOFOL ;  Surgeon: Cindie Carlin POUR, DO;  Location: [facility];  Service: Endoscopy;  Laterality: N/A;  8:15am   EYE SURGERY Left    GASTRIC BYPASS  05/01/2018   GASTRIC ROUX-EN-Y N/A 05/01/2018   Procedure: LAPAROSCOPIC ROUX-EN-Y GASTRIC BYPASS WITH UPPER ENDOSCOPY WITH ERAS PATHWAY;  Surgeon: Kinsinger, Herlene Righter, MD;  Location: [facility];  Service: General;  Laterality: N/A;   LEFT HEART CATHETERIZATION WITH CORONARY ANGIOGRAM N/A 04/09/2013   Procedure: LEFT HEART CATHETERIZATION WITH CORONARY ANGIOGRAM;  Surgeon: Erick JONELLE Bergamo, MD;  Location: [facility] CATH LAB;  Service: Cardiovascular;  Laterality: N/A;   Loop recorder implantation     Insertion of Lux-Dx Water Quality Scientist. Serial # [id number] 09/29/21 for heart failure research   PANNICULECTOMY Bilateral 01/29/2024   Procedure: PANNICULECTOMY;   Surgeon: Montorfano, Lisandro M, MD;  Location: [facility] OR;  Service: Plastics;  Laterality: Bilateral;   TRACHEOSTOMY     at age 52 from asthma attack   Family History Family History  Problem Relation Age of Onset   Diabetes Mother    Asthma Mother    Bronchitis Mother    Colon polyps Father    Hypertension Sister    Asthma Other    Hypertension Other    Colon cancer Neg Hx     Social History Social History[1] Allergies Patient has no known allergies.  Review of Systems Review of Systems  All other systems reviewed and are negative.   Physical Exam Vital Signs  I have reviewed the triage vital signs BP (!) 141/77   Pulse 75   Temp (!) 100.5 F (38.1 C) (Rectal)   Resp 16   Ht 5' 5 (1.651 m)   Wt 95.3 kg   LMP 07/29/2013   SpO2 96%   BMI 34.96 kg/m  Physical Exam Vitals and nursing note reviewed.  Constitutional:      General: She is in acute distress.     Appearance: She is well-developed. She is obese.  HENT:     Head: Normocephalic and atraumatic.     Mouth/Throat:     Mouth: Mucous membranes are moist.  Eyes:     Pupils: Pupils are equal, round, and reactive to light.  Cardiovascular:     Rate and Rhythm: Normal rate and regular rhythm.     Heart sounds: No murmur heard. Pulmonary:     Effort: Tachypnea, accessory muscle usage and respiratory distress present.     Breath sounds: Examination of the right-upper field reveals wheezing and rhonchi. Examination of the left-upper field reveals wheezing and rhonchi. Examination of the right-middle field reveals wheezing and rhonchi. Examination of the left-middle field reveals wheezing and rhonchi. Examination of the right-lower field reveals wheezing and rhonchi. Examination of the left-lower field reveals wheezing and rhonchi.  Wheezing and rhonchi present.  Abdominal:     General: Abdomen is flat.     Palpations: Abdomen is soft.     Tenderness: There is no abdominal tenderness.  Musculoskeletal:         General: No tenderness.     Right lower leg: Edema present.     Left lower leg: Edema present.  Skin:    General: Skin is warm and dry.  Neurological:     General: No focal deficit present.     Mental Status: She is alert. Mental status is at baseline.  Psychiatric:        Mood and Affect: Mood normal.        Behavior: Behavior normal.     ED Results and Treatments Labs (all labs ordered are listed, but only abnormal results are displayed) Labs Reviewed  RESP PANEL BY RT-PCR (RSV, FLU A&B, COVID)  RVPGX2 - Abnormal; Notable for the following components:      Result Value   Influenza A by PCR POSITIVE (*)    All other components within normal limits  COMPREHENSIVE METABOLIC PANEL WITH GFR - Abnormal; Notable for the following components:   Glucose, Bld 65 (*)    BUN 26 (*)    Creatinine, Ser 1.91 (*)    Calcium  8.5 (*)    AST 62 (*)    GFR, Estimated 30 (*)    Anion gap 16 (*)    All other components within normal limits  CBC WITH DIFFERENTIAL/PLATELET - Abnormal; Notable for the following components:   Hemoglobin 11.9 (*)    MCHC 29.8 (*)    Platelets 123 (*)    All other components within normal limits  BLOOD GAS, VENOUS - Abnormal; Notable for the following components:   pO2, Ven <31 (*)    Bicarbonate 29.5 (*)    Acid-Base Excess 3.1 (*)    All other components within normal limits  PRO BRAIN NATRIURETIC PEPTIDE - Abnormal; Notable for the following components:   Pro Brain Natriuretic Peptide 7,225.0 (*)    All other components within normal limits  CBG MONITORING, ED - Abnormal; Notable for the following components:   Glucose-Capillary 69 (*)    All other components within normal limits  TROPONIN T, HIGH SENSITIVITY - Abnormal; Notable for the following components:   Troponin T High Sensitivity 66 (*)    All other components within normal limits  TROPONIN T, HIGH SENSITIVITY - Abnormal; Notable for the following components:   Troponin T High Sensitivity 72 (*)     All other components within normal limits  CULTURE, BLOOD (ROUTINE X 2)  CULTURE, BLOOD (ROUTINE X 2)  C DIFFICILE QUICK SCREEN W PCR REFLEX    LACTIC ACID, PLASMA  LACTIC ACID, PLASMA                                                                                                                          Radiology DG Chest Portable 1 View  Result Date: 03/03/2024 CLINICAL DATA:  Shortness of breath EXAM: PORTABLE CHEST 1 VIEW COMPARISON:  November 26, 2023 FINDINGS: Stable cardiomediastinal silhouette. Left lung is clear. Diffuse airspace opacity is noted in right lung most consistent with pneumonia and possible associated effusion. Bony thorax is unremarkable. IMPRESSION: Diffuse airspace opacity is noted in right lung most consistent with pneumonia and possible associated effusion. Followup PA and lateral chest X-ray is recommended in 3-4 weeks following trial of antibiotic therapy to ensure resolution and exclude underlying malignancy. Electronically Signed   By: Lynwood Landy Raddle M.D.   On: 03/03/2024 17:06    Pertinent labs & imaging results that were available during my care of the patient were reviewed by me and considered in my medical decision making (see MDM for details).  Medications Ordered in ED Medications  azithromycin  (ZITHROMAX ) 500 mg in sodium chloride  0.9 % 250 mL IVPB (0 mg Intravenous Stopped 03/03/24 2009)  acetaminophen  (OFIRMEV ) IV 1,000 mg (0 mg Intravenous Stopped 03/03/24 1851)  oseltamivir  (TAMIFLU ) capsule 75 mg (0 mg Oral Hold 03/03/24 1919)  enoxaparin  (LOVENOX ) injection 40 mg (has no administration in time range)  linezolid  (ZYVOX ) IVPB 600 mg (has no administration in time range)  acetaminophen  (TYLENOL ) tablet 500 mg (has no administration in time range)  prochlorperazine  (COMPAZINE ) injection 5 mg (has no administration in time range)  melatonin tablet 6 mg (has no administration in time range)  polyethylene glycol (MIRALAX  / GLYCOLAX ) packet 17 g (has no  administration in time range)  ipratropium-albuterol  (DUONEB) 0.5-2.5 (3) MG/3ML nebulizer solution 3 mL (3 mLs Nebulization Given 03/03/24 1652)  methylPREDNISolone  sodium succinate (SOLU-MEDROL ) 125 mg/2 mL injection 125 mg (125 mg Intravenous Given 03/03/24 1832)  magnesium  sulfate IVPB 2 g 50 mL (0 g Intravenous Stopped 03/03/24 1854)  sodium chloride  0.9 % bolus 1,000 mL (0 mLs Intravenous Stopped 03/03/24 1919)  cefTRIAXone  (ROCEPHIN ) 2 g in sodium chloride  0.9 % 100 mL IVPB (0 g Intravenous Stopped 03/03/24 1903)  dextrose  50 % solution 12.5 g (12.5 g Intravenous Given 03/03/24 2035)                                                                                                                                     Procedures .Critical Care  Performed by: Francesca Elsie CROME, MD Authorized by: Francesca Elsie CROME, MD   Critical care provider statement:    Critical care time (minutes):  90   Critical care was time spent personally by me on the following activities:  Development of treatment plan with patient or surrogate, discussions with consultants, evaluation of patient's response to treatment, examination of patient, ordering and review of laboratory studies, ordering and review of radiographic studies, ordering and performing treatments and interventions, pulse oximetry, re-evaluation of patient's condition and review of old charts   Care discussed with: admitting provider     (including critical care time)  Medical Decision Making / ED Course  MDM:  59 year old presenting with shortness of breath.  Patient is ill-appearing, with respiratory distress.  On exam with diffuse wheezing.  Suspect likely pneumonia with underlying asthma exacerbation.  Chest x-ray shows dense right-sided consolidation.  She was started on BiPAP with improvement.  Initial hypoxia has improved.  Will give steroids, breathing treatment, IV antibiotics.  Has some trace lower extremity swelling but otherwise without  signs of CHF exacerbation.  Will check BiPAP.  Patient will definitely require admission for further management.  Patient has also been having profuse watery diarrhea at home, will check C. difficile testing    Clinical Course as of 03/03/24 2140  [place] Mar 03, 2024  2137 Signed out to hospitalist Dr. Pearlean for respiratory failure  [WS]    Clinical Course User Index [WS] Francesca Elsie CROME, MD     Additional history obtained: -Additional history obtained from ems -External records from outside source obtained and reviewed including: Chart review including previous notes, labs, imaging, consultation notes including prior notes    Lab Tests: -I ordered, reviewed, and interpreted labs.   The pertinent results include:   Labs Reviewed  RESP PANEL BY RT-PCR (RSV, FLU A&B, COVID)  RVPGX2 - Abnormal; Notable for the following components:      Result Value   Influenza A by PCR POSITIVE (*)    All other components within normal limits  COMPREHENSIVE METABOLIC PANEL WITH GFR - Abnormal; Notable for the following components:   Glucose, Bld 65 (*)    BUN 26 (*)    Creatinine, Ser 1.91 (*)    Calcium  8.5 (*)    AST 62 (*)    GFR, Estimated 30 (*)    Anion gap 16 (*)    All other components within normal limits  CBC WITH DIFFERENTIAL/PLATELET - Abnormal; Notable for the following components:   Hemoglobin 11.9 (*)    MCHC 29.8 (*)    Platelets 123 (*)    All other components within normal limits  BLOOD GAS, VENOUS - Abnormal; Notable for the following components:   pO2, Ven <31 (*)    Bicarbonate 29.5 (*)    Acid-Base Excess 3.1 (*)    All other components within normal limits  PRO BRAIN NATRIURETIC PEPTIDE - Abnormal; Notable for the following components:   Pro Brain Natriuretic Peptide 7,225.0 (*)    All other components within normal limits  CBG MONITORING, ED - Abnormal; Notable for the following components:   Glucose-Capillary 69 (*)    All other components within normal  limits  TROPONIN T, HIGH SENSITIVITY - Abnormal; Notable for the following components:   Troponin T High Sensitivity 66 (*)    All other components within normal limits  TROPONIN T, HIGH SENSITIVITY - Abnormal; Notable for the following components:   Troponin T High Sensitivity 72 (*)    All other components within normal limits  CULTURE, BLOOD (ROUTINE X 2)  CULTURE, BLOOD (ROUTINE X 2)  C DIFFICILE QUICK SCREEN W PCR REFLEX    LACTIC ACID, PLASMA  LACTIC ACID, PLASMA    Notable for elevated troponin likely demand, hypoglycemia, influenza, elevated BNP   EKG   EKG Interpretation Date/Time:  Sunday March 03 2024 16:55:48 EST Ventricular Rate:  78 PR Interval:  138 QRS Duration:  128 QT Interval:  363 QTC Calculation: 414 R Axis:   40  Text Interpretation: Sinus rhythm Nonspecific intraventricular conduction delay Confirmed by Francesca Elsie ([id number]) on 03/03/2024 4:56:22 PM  Imaging Studies ordered: I ordered imaging studies including CXR On my interpretation imaging demonstrates R sided consolidation I independently visualized and interpreted imaging. I agree with the radiologist interpretation   Medicines ordered and prescription drug management: Meds ordered this encounter  Medications   ipratropium-albuterol  (DUONEB) 0.5-2.5 (3) MG/3ML nebulizer solution 3 mL   methylPREDNISolone  sodium succinate (SOLU-MEDROL ) 125 mg/2 mL injection 125 mg   magnesium  sulfate IVPB 2 g 50 mL   sodium chloride  0.9 % bolus 1,000 mL   cefTRIAXone  (ROCEPHIN ) 2 g in sodium chloride  0.9 % 100 mL IVPB    Antibiotic Indication::   CAP   azithromycin  (ZITHROMAX ) 500 mg in sodium chloride  0.9 % 250 mL IVPB    Antibiotic Indication::   CAP   acetaminophen  (OFIRMEV ) IV 1,000 mg    Is the patient UNABLE to take oral / enteral medications?:   Yes   oseltamivir  (TAMIFLU ) capsule 75 mg   dextrose  50 % solution 12.5 g   enoxaparin  (LOVENOX ) injection 40 mg   linezolid  (ZYVOX ) IVPB 600  mg    Antibiotic Indication::   CAP   acetaminophen  (TYLENOL ) tablet 500 mg   prochlorperazine  (COMPAZINE ) injection 5 mg   melatonin tablet 6 mg   polyethylene glycol (MIRALAX  / GLYCOLAX ) packet 17 g    -I have reviewed the patients home medicines and have made adjustments as needed   Consultations Obtained: I requested consultation with the hospitalist,  and discussed lab and imaging findings as well as pertinent plan - they recommend: admission   Cardiac Monitoring: The patient was maintained on a cardiac monitor.  I personally viewed and interpreted the cardiac monitored which showed an underlying rhythm of: nsr  Social Determinants of Health:  Diagnosis or treatment significantly limited by social determinants of health: obesity   Reevaluation: After the interventions noted above, I reevaluated the patient and found that their symptoms have improved  Co morbidities that complicate the patient evaluation  Past Medical History:  Diagnosis Date   Acute asthma exacerbation 12/21/2014   Acute renal failure    ARF (acute renal failure) 05/03/2014   Asthma    Asthma exacerbation 05/03/2014   Asthma, severe persistent (HCC) 05/03/2014   Chronic diastolic CHF (congestive heart failure) (HCC) 06/21/2018   Diabetes mellitus    Diabetes mellitus type 2 in obese 05/03/2014   History of echocardiogram 04/2014   LVH, diastolic dysfunction   Hyperlipidemia    Hypertension    Hyponatremia 12/02/2014   Obesity    Preop examination 05/18/2017   SOB (shortness of breath) 12/22/2014      Dispostion: Disposition decision including need for hospitalization was considered, and patient admitted to the hospital.    Final Clinical Impression(s) / ED Diagnoses Final diagnoses:  Acute hypoxic respiratory failure (HCC)  Community acquired pneumonia of right lung, unspecified part of lung  Acute severe exacerbation of asthma     This chart was dictated using voice recognition  software.  Despite best efforts to proofread,  errors can occur which can change the documentation meaning.     [1]  Social History Tobacco Use   Smoking status: Never    Passive exposure: Never   Smokeless tobacco: Never  Vaping Use   Vaping status: Never Used  Substance Use Topics   Alcohol use: Yes    Comment: occasionally   Drug use: No     Francesca Elsie CROME, MD 03/03/24 2140  "

## 2024-03-03 NOTE — Progress Notes (Signed)
 Pt transported to ICU-1 on Bipap without complications

## 2024-03-03 NOTE — H&P (Incomplete)
 " History and Physical  Lori SYMMONDS FMW:[id number] DOB: [birth year] DOA: 03/03/2024  Referring physician: Dr. Francesca, EDP  PCP: Leigh Lung, MD  Outpatient Specialists: Plastic surgery. Patient coming from: Home.  Chief Complaint: Shortness of breath, respiratory distress, and severe hypoxia.  HPI: [name] is a 59 y.o. female with medical history significant for panniculectomy (01/29/24), severe morbid obesity, asthma, type 2 diabetes, hypertension, hyperlipidemia, chronic HFpEF, CKD 3B, who presents to the ER from home due to shortness of breath, respiratory distress, and severe hypoxia with O2 saturation in the 40% per EMS.  Associated with bilateral lower extremity edema, productive cough, and diarrhea.  The patient was placed on nonrebreather 15 L.  She was brought into the ER for further evaluation.  In the ER, O2 saturation 65% on room air, tachypnea with respiration rate 32.  Influenza A+.  The patient was placed on BiPAP.  Chest x-ray showed diffuse airspace opacity noted in right lung more consistent with pneumonia and possible associated effusion.  Chest x-ray is recommended in 3 to 4 weeks following trial of antibiotic therapy and ensure resolution and exclude underlying malignancy.  Admitted by [facility], hospitalist service.  ED Course: Temperature 97.4.  BP 141/60, pulse 63, respiratory rate 20, O2 saturation 97% on BiPAP.  Review of Systems: Review of systems as noted in the HPI. All other systems reviewed and are negative.   Past Medical History:  Diagnosis Date   Acute asthma exacerbation 12/21/2014   Acute renal failure    ARF (acute renal failure) 05/03/2014   Asthma    Asthma exacerbation 05/03/2014   Asthma, severe persistent (HCC) 05/03/2014   Chronic diastolic CHF (congestive heart failure) (HCC) 06/21/2018   Diabetes mellitus    Diabetes mellitus type 2 in obese 05/03/2014   History of echocardiogram 04/2014   LVH, diastolic dysfunction    Hyperlipidemia    Hypertension    Hyponatremia 12/02/2014   Obesity    Preop examination 05/18/2017   SOB (shortness of breath) 12/22/2014   Past Surgical History:  Procedure Laterality Date   CARDIAC CATHETERIZATION  03/2013   normal coronary arteries, EF 55%   CARDIAC CATHETERIZATION  03/2013   normal coronary arteries   CATARACT EXTRACTION W/PHACO Right 12/10/2012   Procedure: CATARACT EXTRACTION PHACO AND INTRAOCULAR LENS PLACEMENT (IOC);  Surgeon: Cherene Mania, MD;  Location: [facility];  Service: Ophthalmology;  Laterality: Right;  CDE:22..42   CATARACT EXTRACTION W/PHACO Left 05/11/2015   Procedure: CATARACT EXTRACTION PHACO AND INTRAOCULAR LENS PLACEMENT LEFT EYE CDE=6.54;  Surgeon: Cherene Mania, MD;  Location: [facility];  Service: Ophthalmology;  Laterality: Left;   COLONOSCOPY N/A 09/22/2017   Procedure: COLONOSCOPY;  Surgeon: Harvey Margo CROME, MD;  Location: [facility] SUITE;  Service: Endoscopy;  Laterality: N/A;  12:15   COLONOSCOPY WITH PROPOFOL  N/A 09/27/2021   Procedure: COLONOSCOPY WITH PROPOFOL ;  Surgeon: Cindie Carlin POUR, DO;  Location: [facility];  Service: Endoscopy;  Laterality: N/A;  8:15am   EYE SURGERY Left    GASTRIC BYPASS  05/01/2018   GASTRIC ROUX-EN-Y N/A 05/01/2018   Procedure: LAPAROSCOPIC ROUX-EN-Y GASTRIC BYPASS WITH UPPER ENDOSCOPY WITH ERAS PATHWAY;  Surgeon: Kinsinger, Herlene Righter, MD;  Location: [facility];  Service: General;  Laterality: N/A;   LEFT HEART CATHETERIZATION WITH CORONARY ANGIOGRAM N/A 04/09/2013   Procedure: LEFT HEART CATHETERIZATION WITH CORONARY ANGIOGRAM;  Surgeon: Erick JONELLE Bergamo, MD;  Location: [facility] CATH LAB;  Service: Cardiovascular;  Laterality: N/A;   Loop recorder implantation  Insertion of Lux-Dx Water Quality Scientist. Serial # [id number] 09/29/21 for heart failure research   PANNICULECTOMY Bilateral 01/29/2024   Procedure: PANNICULECTOMY;  Surgeon: Montorfano, Lisandro M, MD;  Location: [facility] OR;  Service: Plastics;  Laterality:  Bilateral;   TRACHEOSTOMY     at age 57 from asthma attack    Social History:  reports that she has never smoked. She has never been exposed to tobacco smoke. She has never used smokeless tobacco. She reports current alcohol use. She reports that she does not use drugs.   Allergies[1]  Family History  Problem Relation Age of Onset   Diabetes Mother    Asthma Mother    Bronchitis Mother    Colon polyps Father    Hypertension Sister    Asthma Other    Hypertension Other    Colon cancer Neg Hx       Prior to Admission medications  Medication Sig Start Date End Date Taking? Authorizing Provider  ACCU-CHEK GUIDE TEST test strip 2 (two) times daily. 09/26/23   [provider]  acetaminophen  (TYLENOL ) 500 MG tablet Take 1 tablet (500 mg total) by mouth every 6 (six) hours as needed. 01/12/24   Montorfano, Lisandro M, MD  albuterol  (PROVENTIL  HFA;VENTOLIN  HFA) 108 (90 BASE) MCG/ACT inhaler Inhale 2 puffs into the lungs every 6 (six) hours as needed for wheezing.    [provider]  albuterol  (PROVENTIL ) (2.5 MG/3ML) 0.083% nebulizer solution Take 3 mLs (2.5 mg total) by nebulization every 6 (six) hours as needed for wheezing or shortness of breath. 12/21/14   Joy, Shawn C, PA-C  amLODipine  (NORVASC ) 10 MG tablet Take 10 mg by mouth daily.    [provider]  amLODipine  (NORVASC ) 5 MG tablet TAKE ONE TABLET BY MOUTH ONCE DAILY. 12/17/18   Ladona Heinz, MD  aspirin  81 MG chewable tablet Chew 81 mg by mouth daily. 05/16/23   [provider]  atorvastatin  (LIPITOR) 40 MG tablet Take 40 mg by mouth daily.    [provider]  butalbital -acetaminophen -caffeine  (FIORICET) 50-325-40 MG tablet Take 1 tablet by mouth every 6 (six) hours as needed for headache. 02/21/24 02/20/25  Elnor Jayson LABOR, DO  calcitRIOL (ROCALTROL) 0.25 MCG capsule Take 0.25 mcg by mouth daily.    [provider]  Calcium  Carbonate-Vitamin D  (CALCIUM  600+D PO) Take 1 tablet by  mouth daily.    [provider]  carboxymethylcellulose (REFRESH PLUS) 0.5 % SOLN Place 1 drop into both eyes 3 (three) times daily as needed (dry eyes).    [provider]  carvedilol  (COREG ) 6.25 MG tablet Take 6.25 mg by mouth 2 (two) times daily.  11/08/13   [provider]  Cholecalciferol (VITAMIN D ) 2000 units tablet Take 2,000 Units by mouth daily.     [provider]  FARXIGA 5 MG TABS tablet Take 5 mg by mouth daily.    [provider]  gabapentin  (NEURONTIN ) 300 MG capsule Take 1 capsule (300 mg total) by mouth 4 (four) times daily. Patient taking differently: Take 300 mg by mouth 2 (two) times daily. 05/13/19   Ladona Heinz, MD  HUMALOG  100 UNIT/ML injection Uses in insulin  pump 07/19/21   [provider]  hydrALAZINE  (APRESOLINE ) 25 MG tablet TAKE ONE TABLET BY MOUTH 3 TIMES A DAY 09/22/23   Tolia, Sunit, DO  hydrALAZINE  (APRESOLINE ) 50 MG tablet Take 50 mg by mouth 2 (two) times daily. 10/27/23   [provider]  Insulin  Human (INSULIN  PUMP) SOLN Inject into  the skin.    [provider]  levothyroxine  (SYNTHROID , LEVOTHROID) 75 MCG tablet Take 75 mcg by mouth daily before breakfast.    [provider]  lidocaine  (LIDODERM ) 5 % Place 1 patch onto the skin daily as needed. Remove & Discard patch within 12 hours or as directed by MD 02/21/24   Elnor Savant A, DO  Lifitegrast  (XIIDRA ) 5 % SOLN Place 1 drop into both eyes in the morning and at bedtime.    [provider]  montelukast  (SINGULAIR ) 10 MG tablet Take 10 mg by mouth at bedtime.    [provider]  Multiple Vitamins-Minerals (ADULT GUMMY PO) Take 1 capsule by mouth daily.    [provider]  nystatin  (MYCOSTATIN /NYSTOP ) powder Apply 1 Application topically 3 (three) times daily. 11/21/22   Jayne Vonn DEL, MD  ondansetron  (ZOFRAN -ODT) 4 MG disintegrating tablet Take 1 tablet (4 mg total) by mouth every 8 (eight) hours as needed for  nausea or vomiting. 01/12/24   Montorfano, Lisandro M, MD  oxyCODONE  (ROXICODONE ) 5 MG immediate release tablet Take 1 tablet (5 mg total) by mouth every 4 (four) hours as needed for severe pain (pain score 7-10). 01/12/24   Montorfano, Lisandro M, MD  Semaglutide , 2 MG/DOSE, (OZEMPIC , 2 MG/DOSE,) 8 MG/3ML SOPN INJECT 2MG  INTO THE SKIN ONCE A WEEK 03/09/23   Ladona Heinz, MD  torsemide  (DEMADEX ) 20 MG tablet Take 20 mg by mouth in the morning, at noon, and at bedtime.    [provider]    Physical Exam: BP (!) 141/77   Pulse 75   Temp (!) 100.5 F (38.1 C) (Rectal)   Resp 16   Ht 5' 5 (1.651 m)   Wt 95.3 kg   LMP 07/29/2013   SpO2 96%   BMI 34.96 kg/m   General: 59 y.o. year-old female well developed well nourished in no acute distress.  Alert and oriented x3. Cardiovascular: Regular rate and rhythm with no rubs or gallops.  No thyromegaly or JVD noted.  2+ pitting edema in lower extremities bilaterally. Respiratory: On BiPAP, diffuse rales and wheezes noted bilaterally.  Poor inspiratory effort. Abdomen: Soft nontender nondistended with normal bowel sounds x4 quadrants. Muskuloskeletal: No cyanosis or clubbing noted bilaterally Neuro: CN II-XII intact, strength, sensation, reflexes Skin: No ulcerative lesions noted or rashes Psychiatry: Judgement and insight appear normal. Mood is appropriate for condition and setting          Labs on Admission:  Basic Metabolic Panel: Recent Labs  Lab 03/03/24 1803  NA 141  K 4.2  CL 103  CO2 22  GLUCOSE 65*  BUN 26*  CREATININE 1.91*  CALCIUM  8.5*   Liver Function Tests: Recent Labs  Lab 03/03/24 1803  AST 62*  ALT 30  ALKPHOS 120  BILITOT 0.3  PROT 7.3  ALBUMIN 3.7   No results for input(s): LIPASE, AMYLASE in the last 168 hours. No results for input(s): AMMONIA in the last 168 hours. CBC: Recent Labs  Lab 03/03/24 1803  WBC 7.6  NEUTROABS 6.2  HGB 11.9*  HCT 39.9  MCV 88.1  PLT 123*   Cardiac  Enzymes: No results for input(s): CKTOTAL, CKMB, CKMBINDEX, TROPONINI in the last 168 hours.  BNP (last 3 results) No results for input(s): BNP in the last 8760 hours.  ProBNP (last 3 results) Recent Labs    03/03/24 1803  PROBNP 7,225.0*    CBG: Recent Labs  Lab 03/03/24 2026  GLUCAP 69*    Radiological Exams on  Admission: DG Chest Portable 1 View Result Date: 03/03/2024 CLINICAL DATA:  Shortness of breath EXAM: PORTABLE CHEST 1 VIEW COMPARISON:  November 26, 2023 FINDINGS: Stable cardiomediastinal silhouette. Left lung is clear. Diffuse airspace opacity is noted in right lung most consistent with pneumonia and possible associated effusion. Bony thorax is unremarkable. IMPRESSION: Diffuse airspace opacity is noted in right lung most consistent with pneumonia and possible associated effusion. Followup PA and lateral chest X-ray is recommended in 3-4 weeks following trial of antibiotic therapy to ensure resolution and exclude underlying malignancy. Electronically Signed   By: Lynwood Landy Raddle M.D.   On: 03/03/2024 17:06    EKG: I independently viewed the EKG done and my findings are as followed: Sinus rhythm rate of 78.  QTc 414.  Assessment/Plan Present on Admission:  Acute hypoxic respiratory failure (HCC)  Principal Problem:   Acute hypoxic respiratory failure (HCC)  Acute hypoxic respiratory failure secondary to severe right-sided pneumonia and acute asthma exacerbation Not on oxygen  supplementation at baseline Currently on BiPAP to maintain O2 saturation above 90% Wean off BiPAP as tolerated.  Severe sepsis secondary to right-sided pneumonia Presented with fever with Tmax 101.6, tachypnea 30, chest x-ray with right-sided infiltrates suggestive of pneumonia. Continue broad-spectrum IV antibiotics and antiviral Zosyn , IV azithromycin , IV linezolid , Tamiflu  DuoNebs every 6 hours and every 2 hours as needed As needed antitussives Maintain MAP greater than  65 Monitor fever curve and WBCs.  Influenza A viral infection, POA Tamiflu  75 mg twice daily Droplet precautions  Diarrhea, likely from influenza A viral infection, POA C. difficile negative Supportive care  Acute asthma exacerbation secondary to the above IV Solu-Medrol  40 mg daily Bronchodilator nebulizers Home asthma regimen Rest of management as stated above. Maintaining saturation above 90-92%  Type 2 diabetes with hypoglycemia Presented with serum glucose of 69 Treated in the ER CBG twice daily Continue to hold off antiglycemic's for now.  Acute on chronic HFpEF proBNP 7225, hypervolemic on exam IV Lasix  20 mg x 1 as blood pressure allows Monitor strict I's and O's and daily weight.  CKD 3B Appears to be at her baseline creatinine Creatinine 1.91 with GFR of 30 Avoid nephrotoxic agents and hypotension. Monitor urine output.  Severe obesity BMI 39 Recommend weight loss outpatient regular physical activity and healthy dieting.  Panniculectomy (01/29/24) Wound care specialist consulted to assist with local wound care. Pain control PRN   Critical care time: 65 minutes.   DVT prophylaxis: Subcu Lovenox  daily.  Code Status: Full code.  Family Communication: None at bedside.  Disposition Plan: Admitted to stepdown unit.  Consults called: None.  Admission status: Inpatient status.   Status is: Inpatient The patient requires at least 2 midnights for further evaluation and treatment of present condition.   Terry LOISE Hurst MD [facility] Pager [phone]  If 7PM-7AM, please contact night-coverage [email or web address] Password [email or web address]  03/03/2024, 9:11 PM      [1] No Known Allergies  "

## 2024-03-03 NOTE — ED Notes (Signed)
 Patient cleaned and placed in a clean brief. Patient repositioned.

## 2024-03-04 ENCOUNTER — Inpatient Hospital Stay (HOSPITAL_COMMUNITY)

## 2024-03-04 DIAGNOSIS — J9601 Acute respiratory failure with hypoxia: Secondary | ICD-10-CM | POA: Diagnosis not present

## 2024-03-04 LAB — URINALYSIS, W/ REFLEX TO CULTURE (INFECTION SUSPECTED)
Bacteria, UA: NONE SEEN
Bilirubin Urine: NEGATIVE
Glucose, UA: >=500 mg/dL — AB
Ketones, ur: NEGATIVE mg/dL
Leukocytes,Ua: NEGATIVE
Nitrite: NEGATIVE
Protein, ur: 100 mg/dL — AB
Specific Gravity, Urine: 1.012 (ref 1.005–1.030)
pH: 5 (ref 5.0–8.0)

## 2024-03-04 LAB — BLOOD GAS, VENOUS
Acid-Base Excess: 1.5 mmol/L (ref 0.0–2.0)
Bicarbonate: 26.6 mmol/L (ref 20.0–28.0)
Drawn by: 3409
O2 Saturation: 93.7 %
Patient temperature: 36.7
pCO2, Ven: 42 mmHg — ABNORMAL LOW (ref 44–60)
pH, Ven: 7.4 (ref 7.25–7.43)
pO2, Ven: 61 mmHg — ABNORMAL HIGH (ref 32–45)

## 2024-03-04 LAB — GLUCOSE, CAPILLARY
Glucose-Capillary: 139 mg/dL — ABNORMAL HIGH (ref 70–99)
Glucose-Capillary: 161 mg/dL — ABNORMAL HIGH (ref 70–99)
Glucose-Capillary: 122 mg/dL — ABNORMAL HIGH (ref 70–99)
Glucose-Capillary: 128 mg/dL — ABNORMAL HIGH (ref 70–99)

## 2024-03-04 LAB — MRSA NEXT GEN BY PCR, NASAL: MRSA by PCR Next Gen: NOT DETECTED

## 2024-03-04 MED ORDER — GABAPENTIN 300 MG PO CAPS
300.0000 mg | ORAL_CAPSULE | Freq: Two times a day (BID) | ORAL | Status: DC
  Administered 2024-03-04 – 2024-03-08 (×8): 300 mg via ORAL
  Filled 2024-03-04 (×9): qty 1

## 2024-03-04 MED ORDER — TORSEMIDE 20 MG PO TABS
20.0000 mg | ORAL_TABLET | Freq: Two times a day (BID) | ORAL | Status: DC
  Administered 2024-03-04 – 2024-03-06 (×4): 20 mg via ORAL
  Filled 2024-03-04 (×4): qty 1

## 2024-03-04 MED ORDER — MONTELUKAST SODIUM 10 MG PO TABS
10.0000 mg | ORAL_TABLET | Freq: Every day | ORAL | Status: DC
  Administered 2024-03-05 – 2024-03-07 (×3): 10 mg via ORAL
  Filled 2024-03-04 (×4): qty 1

## 2024-03-04 MED ORDER — OSELTAMIVIR PHOSPHATE 75 MG PO CAPS: 75.0000 mg | ORAL_CAPSULE | Freq: Once | ORAL | Status: DC

## 2024-03-04 MED ORDER — FUROSEMIDE 10 MG/ML IJ SOLN
20.0000 mg | Freq: Once | INTRAMUSCULAR | Status: AC
  Administered 2024-03-04: 20 mg via INTRAVENOUS
  Filled 2024-03-04: qty 2

## 2024-03-04 MED ORDER — IPRATROPIUM-ALBUTEROL 0.5-2.5 (3) MG/3ML IN SOLN
3.0000 mL | Freq: Four times a day (QID) | RESPIRATORY_TRACT | Status: DC
  Administered 2024-03-04 – 2024-03-06 (×11): 3 mL via RESPIRATORY_TRACT
  Filled 2024-03-04 (×11): qty 3

## 2024-03-04 MED ORDER — OSELTAMIVIR PHOSPHATE 30 MG PO CAPS: 30.0000 mg | ORAL_CAPSULE | Freq: Two times a day (BID) | ORAL | Status: DC

## 2024-03-04 MED ORDER — LIFITEGRAST 5 % OP SOLN: 1.0000 [drp] | Freq: Two times a day (BID) | OPHTHALMIC | Status: DC

## 2024-03-04 MED ORDER — LIDOCAINE 5 % EX PTCH: 1.0000 | MEDICATED_PATCH | Freq: Every day | CUTANEOUS | Status: DC | PRN

## 2024-03-04 MED ORDER — ORAL CARE MOUTH RINSE: 15.0000 mL | OROMUCOSAL | Status: DC | PRN

## 2024-03-04 MED ORDER — OSELTAMIVIR PHOSPHATE 75 MG PO CAPS: 75.0000 mg | ORAL_CAPSULE | Freq: Two times a day (BID) | ORAL | Status: DC

## 2024-03-04 MED ORDER — INSULIN ASPART 100 UNIT/ML IJ SOLN
0.0000 [IU] | Freq: Every day | INTRAMUSCULAR | Status: DC
  Administered 2024-03-06: 2 [IU] via SUBCUTANEOUS
  Administered 2024-03-07: 3 [IU] via SUBCUTANEOUS
  Filled 2024-03-04 (×2): qty 1

## 2024-03-04 MED ORDER — OSELTAMIVIR PHOSPHATE 75 MG PO CAPS
75.0000 mg | ORAL_CAPSULE | Freq: Once | ORAL | Status: AC
  Administered 2024-03-04: 75 mg via ORAL
  Filled 2024-03-04: qty 1

## 2024-03-04 MED ORDER — ORAL CARE MOUTH RINSE
15.0000 mL | OROMUCOSAL | Status: DC
  Administered 2024-03-04 – 2024-03-07 (×14): 15 mL via OROMUCOSAL

## 2024-03-04 MED ORDER — GUAIFENESIN-DM 100-10 MG/5ML PO SYRP: 5.0000 mL | ORAL_SOLUTION | ORAL | Status: DC | PRN

## 2024-03-04 MED ORDER — OSELTAMIVIR PHOSPHATE 30 MG PO CAPS
30.0000 mg | ORAL_CAPSULE | Freq: Two times a day (BID) | ORAL | Status: DC
  Administered 2024-03-05 – 2024-03-08 (×7): 30 mg via ORAL
  Filled 2024-03-04 (×8): qty 1

## 2024-03-04 MED ORDER — IPRATROPIUM-ALBUTEROL 0.5-2.5 (3) MG/3ML IN SOLN: 3.0000 mL | RESPIRATORY_TRACT | Status: DC | PRN

## 2024-03-04 MED ORDER — INSULIN ASPART 100 UNIT/ML IJ SOLN
0.0000 [IU] | Freq: Three times a day (TID) | INTRAMUSCULAR | Status: DC
  Administered 2024-03-04 – 2024-03-05 (×2): 1 [IU] via SUBCUTANEOUS
  Administered 2024-03-05: 2 [IU] via SUBCUTANEOUS
  Administered 2024-03-05: 1 [IU] via SUBCUTANEOUS
  Administered 2024-03-06 (×2): 2 [IU] via SUBCUTANEOUS
  Administered 2024-03-06: 1 [IU] via SUBCUTANEOUS
  Administered 2024-03-07 (×2): 5 [IU] via SUBCUTANEOUS
  Administered 2024-03-07 – 2024-03-08 (×2): 2 [IU] via SUBCUTANEOUS
  Administered 2024-03-08: 3 [IU] via SUBCUTANEOUS
  Filled 2024-03-04 (×12): qty 1

## 2024-03-04 MED ORDER — METHYLPREDNISOLONE SODIUM SUCC 125 MG IJ SOLR
60.0000 mg | Freq: Two times a day (BID) | INTRAMUSCULAR | Status: DC
  Administered 2024-03-04 – 2024-03-08 (×8): 60 mg via INTRAVENOUS
  Filled 2024-03-04 (×8): qty 2

## 2024-03-04 NOTE — Plan of Care (Signed)
" °  Problem: Education: Goal: Knowledge of General Education information will improve Description: Including pain rating scale, medication(s)/side effects and non-pharmacologic comfort measures Outcome: Progressing   Problem: Health Behavior/Discharge Planning: Goal: Ability to manage health-related needs will improve Outcome: Progressing   Problem: Clinical Measurements: Goal: Ability to maintain clinical measurements within normal limits will improve Outcome: Progressing   Problem: Coping: Goal: Level of anxiety will decrease Outcome: Progressing   Problem: Pain Managment: Goal: General experience of comfort will improve and/or be controlled Outcome: Progressing   Problem: Safety: Goal: Ability to remain free from injury will improve Outcome: Progressing   Problem: Clinical Measurements: Goal: Respiratory complications will improve Outcome: Not Progressing   Problem: Activity: Goal: Risk for activity intolerance will decrease Outcome: Not Progressing   Problem: Elimination: Goal: Will not experience complications related to bowel motility Outcome: Not Progressing Goal: Will not experience complications related to urinary retention Outcome: Not Progressing   Problem: Skin Integrity: Goal: Risk for impaired skin integrity will decrease Outcome: Not Progressing   "

## 2024-03-04 NOTE — TOC Initial Note (Signed)
 Transition of Care [facility]) - Initial/Assessment Note    Patient Details  Name: [name] MRN: [id number] Date of Birth: 1965/10/14  [facility] of [facility]) CM/SW Contact:    Lucie Lunger, LCSWA Phone Number: 03/04/2024, 1:12 PM  Clinical Narrative:                 Pt is high risk for readmission. CSW spoke with pts mother to complete assessment. Pt lives with her mother. Pt is able to complete her ADLs but has assistance if needed. Pt uses [employer] for transportation. Pts mother states pt had HH in the past but not currently. Pt does not have DME of her own but uses her mother's walker if needed. TOC to follow.   Expected Discharge Plan: Home/Self Care Barriers to Discharge: Continued Medical Work up   Patient Goals and CMS Choice Patient states their goals for this hospitalization and ongoing recovery are:: get better CMS Medicare.gov Compare Post Acute Care list provided to:: Patient Represenative (must comment) Choice offered to / list presented to : Patient, Parent      Expected Discharge Plan and Services In-house Referral: Clinical Social Work Discharge Planning Services: CM Consult   Living arrangements for the past 2 months: Single Family Home                                      Prior Living Arrangements/Services Living arrangements for the past 2 months: Single Family Home Lives with:: Parents Patient language and need for interpreter reviewed:: Yes Do you feel safe going back to the place where you live?: Yes      Need for Family Participation in Patient Care: Yes (Comment) Care giver support system in place?: Yes (comment)   Criminal Activity/Legal Involvement Pertinent to Current Situation/Hospitalization: No - Comment as needed  Activities of Daily Living   ADL Screening (condition at time of admission) Independently performs ADLs?: Yes (appropriate for developmental age) Is the patient deaf or have difficulty hearing?: No Does the  patient have difficulty seeing, even when wearing glasses/contacts?: No Does the patient have difficulty concentrating, remembering, or making decisions?: No  Permission Sought/Granted                  Emotional Assessment Appearance:: Appears stated age Attitude/Demeanor/Rapport: Engaged Affect (typically observed): Accepting   Alcohol / Substance Use: Not Applicable Psych Involvement: No (comment)  Admission diagnosis:  Acute severe exacerbation of asthma [J45.901] Community acquired pneumonia of right lung, unspecified part of lung [J18.9] Acute hypoxic respiratory failure (HCC) [J96.01] Patient Active Problem List   Diagnosis Date Noted   Acute hypoxic respiratory failure (HCC) 03/03/2024   Loop recorder Insertion of Lux-Dx [employer]. Serial # [id number] for Lux DX heart failure trial 09/29/2021 09/29/2021   Chronic diastolic CHF (congestive heart failure) (HCC) 06/21/2018   Obesity 05/01/2018   Special screening for malignant neoplasms, colon    SOB (shortness of breath) 12/22/2014   Hyponatremia 12/02/2014   CKD (chronic kidney disease) stage 3, GFR 30-59 ml/min (HCC) 05/04/2014   Asthma exacerbation 05/03/2014   Type 2 diabetes mellitus with obesity 05/03/2014   Hypertension 05/03/2014   Hyperlipidemia 05/03/2014   PCP:  Leigh Lung, MD Pharmacy:   [facility] - [place], [place] - [address] [place] [address] Phone: [phone] Fax: [phone]  [facility] [address] [place] - [address] [place] [address] Phone: [phone] Fax: [phone]  [id number] - [place], [place] - [address] AT [address] [place] [address] Phone: [phone] Fax: [phone]     Social Drivers of Health (SDOH) Social History: SDOH Screenings   Food Insecurity: No Food Insecurity (03/03/2024)  Housing: Low  Risk (03/03/2024)  Transportation Needs: No Transportation Needs (03/03/2024)  Utilities: Not At Risk (03/03/2024)  Tobacco Use: Low Risk (03/03/2024)   SDOH Interventions:     Readmission Risk Interventions    03/04/2024    1:11 PM  Readmission Risk Prevention Plan  Transportation Screening Complete  HRI or Home Care Consult Complete  Social Work Consult for Recovery Care Planning/Counseling Complete  Palliative Care Screening Not Applicable  Medication Review Oceanographer) Complete

## 2024-03-04 NOTE — Consult Note (Signed)
 WOC Nurse Consult Note: patient underwent panniculectomy by plastic surgeon 01/29/2024; followed in their office 12/24 with orders to cleanse incision with Vashe and apply Xeroform to any open areas  Reason for Consult: s/p panniculectomy  Wound type: full thickness surgical incision  Pressure Injury POA: NA not pressure  Measurement: see nursing flowsheet  Wound bed: well healing surgical incision with scattered areas of dry yellow tissue/red moist areas as well minimal  Drainage (amount, consistency, odor) see nursing flowsheet  Periwound: scar tissue  Dressing procedure/placement/frequency: Cleanse abdominal/pannus wound with Vashe, do not rinse. Apply Xeroform gauze to any open areas daily then cover with dry gauze/ABD pad and clothe tape.    POC discussed with bedside nurse.  Patient should continue to follow with surgeon as outpatient.   WOC team will not follow. Reconsult if further needs arise.   Thank you,    Powell Bar MSN, RN-BC, [employer]

## 2024-03-04 NOTE — Plan of Care (Signed)
  Problem: Education: Goal: Knowledge of General Education information will improve Description: Including pain rating scale, medication(s)/side effects and non-pharmacologic comfort measures Outcome: Progressing   Problem: Nutrition: Goal: Adequate nutrition will be maintained Outcome: Progressing   Problem: Pain Managment: Goal: General experience of comfort will improve and/or be controlled Outcome: Progressing

## 2024-03-04 NOTE — Plan of Care (Signed)
  Problem: Education: Goal: Knowledge of General Education information will improve Description: Including pain rating scale, medication(s)/side effects and non-pharmacologic comfort measures Outcome: Progressing   Problem: Coping: Goal: Level of anxiety will decrease Outcome: Progressing   Problem: Pain Managment: Goal: General experience of comfort will improve and/or be controlled Outcome: Progressing

## 2024-03-04 NOTE — Progress Notes (Signed)
 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 She reported associated shortness of breath, productive cough, bilateral lower-extremity edema, and diarrhea. On arrival to the emergency department, she was tachypneic and severely hypoxic, requiring a nonrebreather followed by BiPAP support. She tested positive for influenza A, and CXR demonstrated diffuse right-sided airspace opacities with possible associated effusion.  She was admitted to the [facility] hospitalist service for management of acute hypoxic respiratory failure due to severe right-sided pneumonia with concomitant influenza A infection and acute asthma exacerbation, complicated by severe sepsis. She has continued to need BiPAP with breaks using HFNC, getting broad-spectrum IV antibiotics and tamiflu , steroids, and bronchodilators. There acute on chronic HFpEF with evidence of volume overload, treated with cautious IV diuresis, and transient hypoglycemia leading to temporary holding of antiglycemic agents. Diarrhea was felt to be viral in origin with C. difficile ruled out. Renal function remained at baseline CKD 3B levels. Wound care was consulted for management of her recent panniculectomy site, and supportive care continued with close monitoring of respiratory status, volume status, renal function, and infectious markers.  Subjective: States she feels quite comfortable on first evaluation with BiPAP on, she's hungry. Wheezing has improved. Later seen without BiPAP on, saturating low 90%'s with normal effort and confirms no dyspnea at rest at that time.  Later did report some dyspnea so put back on BiPAP.   Objective: BP (!) 117/40   Pulse 68   Temp 98 F (36.7 C) (Axillary)   Resp (!) 27   Ht 5' (1.524 m)   Wt 92.4 kg   LMP 07/29/2013   SpO2 96%   BMI 39.78 kg/m   Gen: No distress Pulm: Diminished with no wheezing, prominent right side crackles.  CV: Regular tachycardia. No MRG. Trace pitting LE edema  GI: Soft, minimally tender. +BS Neuro: Alert and oriented. No new focal deficits. Ext: Warm, no deformities.   Assessment & Plan: Acute hypoxic respiratory failure: due to influenza A and superimposed PNA causing asthma exacerbation. - New significant O2 requirement. Continue prn BiPAP as needed for respiratory effort and/or oxygenation. Try weaning to HFNC as tolerated. Exam is consistent with etiologies as outlined below. Could consider CTA, though with her CKD this is high risk. Mention of effusion, though moving air bilaterally - Repeat CXR. Check VBG. - VBG: pH 7.35, pCO2 55 suggests mild hypercapnia/ventilatory limitation consistent with asthma/obesity hypoventilation physiology prior to BiPAP. - Pulmonary hygiene, incentive spirometry when able, mobilize as tolerated.  Sepsis due to pneumonia, influenza:  - Hemodynamics stable, hypertensive.  - Deescalate abx with negative MRSA PCR stop linezolid . Continue zosyn  as presentation is unclear, possible aspiration. Continue azithromycin .  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 high-risk phenotype. - Wheezing has improved, she appears comfortable but was unable to wean to HFNC this afternoon. Keep in SDU/ICU setting. Check CXR, VBG as above.  - Continue IV steroids - Continue scheduled DuoNebs + PRN - Continue montelukast  and home controller regimen as feasible.   Acute on chronic HFpEF: proBNP 7,225, BLE edema is per  pt at baseline. -  Restart home diuretic and monitor I/O closely. BP has come up. This is not the primary contributor to presentation.    Stage IIIb CKD: Near baseline Cr 1.9.  - Renally adjust zosyn , tamiflu .  - Monitor with attempt at diuresis.     IDT2DM: Well-controlled based on HbA1c 5.1%.  - DC insulin  pump - Cover with sliding scale insulin  pending clinical course. With hypoglycemia on presentation and controlled sugars thus far, hesitant to start basal.    Mild troponin elevation (hs-TnT 66 ? 72) - likely demand ischemia / myocardial strain: Prior cath (2015) with normal coronaries - Monitor   Thrombocytopenia (Plt 123) and mild anemia (Hgb 11.9): Possibly related to flu.  - Monitor in AM.   Recent panniculectomy (01/29/24):  - Continue wound care consult and local care. cleanse incision with Vashe and apply Xeroform to any open areas   HTN:  - Will hold amlodipine , hydralazine  but may restart depending on clinical course. Holding nonselective beta blocker.    Bernardino KATHEE Come, MD [facility] [email or web address] 03/04/2024, 5:22 PM

## 2024-03-04 NOTE — Progress Notes (Signed)
"                     [facility], hospitalist service, overnight cross coverage  Foley catheter placed for acute urinary retention 856 cc from bladder scan.  No charge note. "

## 2024-03-05 DIAGNOSIS — J9601 Acute respiratory failure with hypoxia: Secondary | ICD-10-CM | POA: Diagnosis not present

## 2024-03-05 LAB — CBC
HCT: 40.8 % (ref 36.0–46.0)
Hemoglobin: 12.6 g/dL (ref 12.0–15.0)
MCH: 26.5 pg (ref 26.0–34.0)
MCHC: 30.9 g/dL (ref 30.0–36.0)
MCV: 85.9 fL (ref 80.0–100.0)
Platelets: 176 K/uL (ref 150–400)
RBC: 4.75 MIL/uL (ref 3.87–5.11)
RDW: 14.3 % (ref 11.5–15.5)
WBC: 9.5 K/uL (ref 4.0–10.5)
nRBC: 0 % (ref 0.0–0.2)

## 2024-03-05 LAB — BASIC METABOLIC PANEL WITH GFR
Anion gap: 17 — ABNORMAL HIGH (ref 5–15)
BUN: 33 mg/dL — ABNORMAL HIGH (ref 6–20)
CO2: 22 mmol/L (ref 22–32)
Calcium: 8.5 mg/dL — ABNORMAL LOW (ref 8.9–10.3)
Chloride: 101 mmol/L (ref 98–111)
Creatinine, Ser: 1.78 mg/dL — ABNORMAL HIGH (ref 0.44–1.00)
GFR, Estimated: 33 mL/min — ABNORMAL LOW
Glucose, Bld: 165 mg/dL — ABNORMAL HIGH (ref 70–99)
Potassium: 4.7 mmol/L (ref 3.5–5.1)
Sodium: 141 mmol/L (ref 135–145)

## 2024-03-05 LAB — GLUCOSE, CAPILLARY
Glucose-Capillary: 147 mg/dL — ABNORMAL HIGH (ref 70–99)
Glucose-Capillary: 146 mg/dL — ABNORMAL HIGH (ref 70–99)
Glucose-Capillary: 174 mg/dL — ABNORMAL HIGH (ref 70–99)
Glucose-Capillary: 136 mg/dL — ABNORMAL HIGH (ref 70–99)

## 2024-03-05 MED ORDER — LEVOTHYROXINE SODIUM 75 MCG PO TABS
75.0000 ug | ORAL_TABLET | Freq: Every day | ORAL | Status: DC
  Administered 2024-03-06 – 2024-03-08 (×3): 75 ug via ORAL
  Filled 2024-03-05 (×3): qty 1

## 2024-03-05 MED ORDER — HYDROMORPHONE HCL 1 MG/ML IJ SOLN
0.5000 mg | INTRAMUSCULAR | Status: AC
  Administered 2024-03-05: 0.5 mg via INTRAVENOUS
  Filled 2024-03-05: qty 0.5

## 2024-03-05 MED ORDER — HYDRALAZINE HCL 50 MG PO TABS
50.0000 mg | ORAL_TABLET | Freq: Two times a day (BID) | ORAL | Status: DC
  Administered 2024-03-05 – 2024-03-08 (×7): 50 mg via ORAL
  Filled 2024-03-05 (×7): qty 1

## 2024-03-05 MED ORDER — METOPROLOL TARTRATE 25 MG PO TABS: 12.5000 mg | ORAL_TABLET | Freq: Two times a day (BID) | ORAL | Status: DC

## 2024-03-05 MED ORDER — AMLODIPINE BESYLATE 5 MG PO TABS
10.0000 mg | ORAL_TABLET | Freq: Every day | ORAL | Status: DC
  Administered 2024-03-05 – 2024-03-08 (×4): 10 mg via ORAL
  Filled 2024-03-05 (×4): qty 2

## 2024-03-05 NOTE — Progress Notes (Signed)
 Patient is wanting to come off Bipap for a little while.  RN placing patient back on 11L [place] HFNC.

## 2024-03-05 NOTE — Progress Notes (Signed)
 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 She reported associated shortness of breath, productive cough, bilateral lower-extremity edema, and diarrhea. On arrival to the emergency department, she was tachypneic and severely hypoxic, requiring a nonrebreather followed by BiPAP support. She tested positive for influenza A, and CXR demonstrated diffuse right-sided airspace opacities with possible associated effusion.  She was admitted to the [facility] hospitalist service for management of acute hypoxic respiratory failure due to severe right-sided pneumonia with concomitant influenza A infection and acute asthma exacerbation, complicated by sepsis. She has continued to need BiPAP with breaks using HFNC, getting broad-spectrum IV antibiotics and tamiflu , steroids, and bronchodilators. There acute on chronic HFpEF with evidence of volume overload, treated with cautious IV diuresis, and transient hypoglycemia leading to temporary holding of antiglycemic agents. Diarrhea was felt to be viral in origin with C. difficile ruled out.   On 1/6, she has transitioned off BiPAP to HFNC at 8L O2.   Subjective: Seen this morning and reports she'd feel better if she could just cough up her phlegm. No chest pain, her dyspnea is improving.   Objective: BP (!) 169/58   Pulse 64   Temp 98.1 F (36.7 C) (Oral)   Resp 19   Ht 5' (1.524 m)   Wt 92.4 kg   LMP 07/29/2013   SpO2 91%   BMI 39.78 kg/m   Gen: Obese female in no distress.  Pulm: No wheezing this morning, diminished with some crackles throughout right side,  tachypneic on 8L but not labored at rest  CV: RRR, borderline bradycardia.  GI: Soft, NT, ND, +BS. Not significantly tender, lower abdominal incision site with c/d/I dressing Neuro: Alert and oriented. No new focal deficits. Ext: Warm, no deformities Skin: No rashes, lesions or ulcers on visualized skin   Assessment & Plan: Acute hypoxic respiratory failure: due to influenza A and superimposed PNA causing asthma exacerbation. - New significant O2 requirement. Continue prn BiPAP as needed for respiratory effort and/or oxygenation. Continue weaning to HFNC as tolerated. Exam is consistent with etiologies as outlined below. Could consider CTA, though with her CKD this is high risk. Mention of effusion, though moving air bilaterally - Pulmonary hygiene, incentive spirometry when able, mobilize as tolerated.  Sepsis due to pneumonia, influenza:  - Hemodynamics stable, hypertensive.  - Deescalate abx with negative MRSA PCR stop linezolid . Continue zosyn  as presentation is unclear, possible aspiration. Continue azithromycin .  - Continue tamiflu .  Acute asthma exacerbation (severe persistent asthma history): Long history of exacerbations (2016+) and very severe childhood course (tracheostomy at age 60) ? high-risk phenotype. - Wheezing has improved, weaned to HFNC this morning and looks like doing ok. VBG reassuring from last night. CXR minimally change.   - Continue IV steroids - Continue scheduled DuoNebs + PRN - Continue montelukast .   Acute on chronic HFpEF: proBNP 7,225, BLE edema is per pt at baseline. - Restarted home diuretic, significant UOP thus far, continue and monitor I/O closely. BP has come up, restarting home antihypertensives.  HTN: Overall, BPs rising significantly now.  - Restart amlodipine , hydralazine , and will hold beta blocker with recent bronchospasm and baseline low-normal HR  Stage  IIIb CKD: Near baseline Cr 1.9.  - Renally adjust zosyn , tamiflu .  - Monitor with  attempt at diuresis.     IDT2DM: Well-controlled based on HbA1c 5.1%.  - DC insulin  pump, can restart once more stable - Cover with sliding scale insulin  pending clinical course. With hypoglycemia on presentation and controlled sugars thus far, hesitant to restart basal. Continue monitoring.  Mild troponin elevation (hs-TnT 66 ? 72) - likely demand ischemia / myocardial strain: Prior cath (2015) with normal coronaries. No ongoing chest pain.  - Monitor   Thrombocytopenia (Plt 123) and mild anemia (Hgb 11.9): Possibly related to flu.  - Monitor in AM. No bleeding thus far.   Recent panniculectomy (01/29/24):  - Continue wound care consult and local care. Cleanse incision with Vashe and apply Xeroform to any open areas. This appears to be healing fairly well. Will continue outpatient plastics follow up after discharge.      Bernardino KATHEE Come, MD [facility] [email or web address] 03/05/2024, 2:19 PM

## 2024-03-05 NOTE — Inpatient Diabetes Management (Signed)
 Inpatient Diabetes Program Recommendations  AACE/ADA: New Consensus Statement on Inpatient Glycemic Control (2015)  Target Ranges:  Prepandial:   less than 140 mg/dL      Peak postprandial:   less than 180 mg/dL (1-2 hours)      Critically ill patients:  140 - 180 mg/dL   Lab Results  Component Value Date   GLUCAP 147 (H) 03/05/2024   HGBA1C 5.1 01/19/2024    Latest Reference Range & Units 03/03/24 20:26 03/04/24 02:41 03/04/24 10:15 03/04/24 16:03 03/04/24 21:41 03/05/24 07:31  Glucose-Capillary 70 - 99 mg/dL 69 (L) 860 (H) 838 (H) 122 (H) 128 (H) 147 (H)  (L): Data is abnormally low (H): Data is abnormally high  Diabetes history: DM2 Outpatient Diabetes medications: Insulin  pump  Ozempic  2 mg weekly  Current orders for Inpatient glycemic control:  Novolog  0-9 units tid, 0-5 units hs correction   Solumedrol 60 mg q 12 hrs.  Inpatient Diabetes Program Recommendations:    Received consult regarding insulin  pump. Based on CBGs, recommend current Novolog  correction scale and keep reassessing daily. Will need followup with physician that orders her insulin  pump. Unable to find pump settings @ this time and DM coordinator working remotely @ [employer].  Thank you, Jermale Crass E. Oluwadarasimi Favor, RN, MSN, CNS, CDCES  Diabetes Coordinator Inpatient Glycemic Control Team Team Pager [phone] (8am-5pm) 03/05/2024 10:46 AM

## 2024-03-05 NOTE — Progress Notes (Unsigned)
 Patient is a pleasant 59 year old female s/p panniculectomy performed 01/29/2024 by Dr. Montorfano who presents to clinic for postoperative follow-up.   She was last seen here in clinic on 02/21/2024.  At that time, she was doing well from a postoperative standpoint.  Drain was removed without complication or difficulty.  Silver nitrate applied to areas of hypergranulation and slough.  Follow-up in 2 weeks for likely final postoperative encounter.  Upon chart review, noted that patient was seen in the ED 03/03/2024 with severe hypoxic respiratory failure.

## 2024-03-06 DIAGNOSIS — J9601 Acute respiratory failure with hypoxia: Secondary | ICD-10-CM | POA: Diagnosis not present

## 2024-03-06 LAB — BASIC METABOLIC PANEL WITH GFR
Anion gap: 11 (ref 5–15)
BUN: 32 mg/dL — ABNORMAL HIGH (ref 6–20)
CO2: 28 mmol/L (ref 22–32)
Calcium: 8.3 mg/dL — ABNORMAL LOW (ref 8.9–10.3)
Chloride: 102 mmol/L (ref 98–111)
Creatinine, Ser: 1.67 mg/dL — ABNORMAL HIGH (ref 0.44–1.00)
GFR, Estimated: 35 mL/min — ABNORMAL LOW
Glucose, Bld: 153 mg/dL — ABNORMAL HIGH (ref 70–99)
Potassium: 4.2 mmol/L (ref 3.5–5.1)
Sodium: 141 mmol/L (ref 135–145)

## 2024-03-06 LAB — CBC
HCT: 34.5 % — ABNORMAL LOW (ref 36.0–46.0)
Hemoglobin: 10.9 g/dL — ABNORMAL LOW (ref 12.0–15.0)
MCH: 26.4 pg (ref 26.0–34.0)
MCHC: 31.6 g/dL (ref 30.0–36.0)
MCV: 83.5 fL (ref 80.0–100.0)
Platelets: 173 K/uL (ref 150–400)
RBC: 4.13 MIL/uL (ref 3.87–5.11)
RDW: 14.2 % (ref 11.5–15.5)
WBC: 7.3 K/uL (ref 4.0–10.5)
nRBC: 0 % (ref 0.0–0.2)

## 2024-03-06 LAB — GLUCOSE, CAPILLARY
Glucose-Capillary: 147 mg/dL — ABNORMAL HIGH (ref 70–99)
Glucose-Capillary: 166 mg/dL — ABNORMAL HIGH (ref 70–99)
Glucose-Capillary: 192 mg/dL — ABNORMAL HIGH (ref 70–99)
Glucose-Capillary: 215 mg/dL — ABNORMAL HIGH (ref 70–99)

## 2024-03-06 MED ORDER — IPRATROPIUM-ALBUTEROL 0.5-2.5 (3) MG/3ML IN SOLN
3.0000 mL | Freq: Three times a day (TID) | RESPIRATORY_TRACT | Status: DC
  Administered 2024-03-07 – 2024-03-08 (×5): 3 mL via RESPIRATORY_TRACT
  Filled 2024-03-06 (×5): qty 3

## 2024-03-06 MED ORDER — AZITHROMYCIN 250 MG PO TABS: 500.0000 mg | ORAL_TABLET | Freq: Every day | ORAL | Status: DC

## 2024-03-06 MED ORDER — AZITHROMYCIN 250 MG PO TABS
500.0000 mg | ORAL_TABLET | Freq: Every day | ORAL | Status: AC
  Administered 2024-03-06 – 2024-03-07 (×2): 500 mg via ORAL
  Filled 2024-03-06 (×2): qty 2

## 2024-03-06 MED ORDER — CEPHALEXIN 500 MG PO CAPS
500.0000 mg | ORAL_CAPSULE | Freq: Two times a day (BID) | ORAL | Status: DC
  Administered 2024-03-07: 500 mg via ORAL
  Filled 2024-03-06: qty 1

## 2024-03-06 MED ORDER — TORSEMIDE 20 MG PO TABS
20.0000 mg | ORAL_TABLET | Freq: Every day | ORAL | Status: DC
  Administered 2024-03-07 – 2024-03-08 (×2): 20 mg via ORAL
  Filled 2024-03-06 (×2): qty 1

## 2024-03-06 NOTE — Progress Notes (Signed)
 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 She reported associated shortness of breath, productive cough, bilateral lower-extremity edema, and diarrhea. On arrival to the emergency department, she was tachypneic and severely hypoxic, requiring a nonrebreather followed by BiPAP support. She tested positive for influenza A, and CXR demonstrated diffuse right-sided airspace opacities with possible associated effusion.  She was admitted to the [facility] hospitalist service for management of acute hypoxic respiratory failure due to severe right-sided pneumonia with concomitant influenza A infection and acute asthma exacerbation, complicated by sepsis. She has continued to need BiPAP with breaks using HFNC, getting broad-spectrum IV antibiotics and tamiflu , steroids, and bronchodilators. There acute on chronic HFpEF with evidence of volume overload, treated with cautious IV diuresis, and transient hypoglycemia leading to temporary holding of antiglycemic agents. Diarrhea was felt to be viral in origin with C. difficile ruled out.   On 1/6, she has transitioned off BiPAP to HFNC at 8L O2.   Subjective: Cough and dyspnea improving - Hypoxia improving currently weaned down to 4 L of oxygen  via nasal cannula from 8 L - Some dyspnea on exertion persist  Objective: BP (!) 150/66   Pulse 72   Temp 97.7 F (36.5 C) (Oral)   Resp 15   Ht 5' (1.524 m)   Wt 92.4 kg   LMP 07/29/2013   SpO2 92%   BMI 39.78 kg/m     Physical Exam Gen:- Awake Alert, in no acute distress  HEENT:- [place].AT, No sclera icterus Nose- [place]  4L/min Neck-Supple Neck,No JVD,.  Lungs-improving air movement, no significant wheezing CV- S1, S2 normal, RRR Abd-  +ve B.Sounds, Abd Soft, No tenderness,    Extremity/Skin:- No  edema,   good pedal pulses  Psych-affect is appropriate, oriented x3 Neuro-no new focal deficits, no tremors  Assessment & Plan: 1)Acute hypoxic respiratory failure: due to influenza A and superimposed PNA causing asthma exacerbation. - Treated with IV Solu-Medrol , azithromycin , mucolytics and bronchodilators - Respiratory status improving - No longer requiring BiPAP - No longer requiring high flow nasal cannula - Hypoxia improving currently weaned down to 4 L of oxygen  via nasal cannula from 8 L -- Okay to complete Tamiflu   2)Sepsis due to Pneumonia- - Hemodynamics stable, hypertensive.  - Deescalate abx with negative MRSA PCR -Linezolid  previously discontinued given negative MRSA swab -Okay to de-escalate from Zosyn  to Keflex  -Continue azithromycin  - Continue tamiflu .  3)Acute asthma exacerbation (severe persistent asthma history): Long history of exacerbations (2016+) and very severe childhood course (tracheostomy at age 71) ? high-risk phenotype. -Respiratory status improving as outlined above #1 - Continue IV steroids - Continue scheduled DuoNebs + PRN - Continue montelukast .   4)Acute on chronic HFpEF: proBNP 7,225, BLE edema is per pt at baseline. - Restarted home diuretic, significant UOP thus far, continue and monitor I/O closely. BP has come up, restarting home antihypertensives.  5)HTN: Overall, BPs rising significantly now.  - Restart amlodipine , hydralazine , and will hold beta blocker with recent bronchospasm and baseline low-normal HR  6)Stage IIIb CKD:  -Creatinine down to 1.67 from 1.91 on admission -Monitor renal function closely while on torsemide  - renally adjust medications, avoid nephrotoxic agents /  dehydration  / hypotension  7)IDT2DM: Well-controlled based on HbA1c 5.1%.  -  DC insulin  pump, can restart once more stable - Use Novolog /Humalog  Sliding scale insulin  with Accu-Cheks/Fingersticks as ordered   8)Mild Troponin Elevation (hs-TnT 66 ? 72) - likely demand ischemia / myocardial strain: Prior cath (2015) with normal coronaries. No ongoing chest pain.  - Monitor   9)Thrombocytopenia (Plt 123) and mild anemia (Hgb 11.9): Possibly related to flu.  - Platelets have normalized - Hgb around 11  10)Recent panniculectomy (01/29/24):  - Continue wound care consult and local care. Cleanse incision with Vashe and apply Xeroform to any open areas. This appears to be healing fairly well. Will continue outpatient plastics follow up after discharge.    Rendall Carwin, MD [facility] [email or web address] 03/06/2024, 3:50 PM

## 2024-03-06 NOTE — Plan of Care (Signed)
" °  Problem: Clinical Measurements: Goal: Respiratory complications will improve Outcome: Progressing Goal: Cardiovascular complication will be avoided Outcome: Progressing   Problem: Health Behavior/Discharge Planning: Goal: Ability to manage health-related needs will improve Outcome: Progressing   Problem: Education: Goal: Knowledge of General Education information will improve Description: Including pain rating scale, medication(s)/side effects and non-pharmacologic comfort measures Outcome: Progressing   "

## 2024-03-06 NOTE — Progress Notes (Signed)
 At 1903 Patient in room 303. Tech in room, placing telemetry on patient, and taking vitals. Report given to [facility] RN

## 2024-03-07 ENCOUNTER — Encounter: Admitting: Physician Assistant

## 2024-03-07 DIAGNOSIS — J9601 Acute respiratory failure with hypoxia: Secondary | ICD-10-CM | POA: Diagnosis not present

## 2024-03-07 LAB — GLUCOSE, CAPILLARY
Glucose-Capillary: 192 mg/dL — ABNORMAL HIGH (ref 70–99)
Glucose-Capillary: 258 mg/dL — ABNORMAL HIGH (ref 70–99)
Glucose-Capillary: 287 mg/dL — ABNORMAL HIGH (ref 70–99)
Glucose-Capillary: 294 mg/dL — ABNORMAL HIGH (ref 70–99)

## 2024-03-07 MED ORDER — CEFUROXIME AXETIL 250 MG PO TABS
500.0000 mg | ORAL_TABLET | Freq: Two times a day (BID) | ORAL | Status: DC
  Administered 2024-03-07 – 2024-03-08 (×2): 500 mg via ORAL
  Filled 2024-03-07 (×2): qty 2

## 2024-03-07 NOTE — Progress Notes (Signed)
 Mobility Specialist Progress Note:    03/07/24 0907  Mobility  Activity Ambulated with assistance  Level of Assistance Standby assist, set-up cues, supervision of patient - no hands on  Assistive Device Cane  Distance Ambulated (ft) 60 ft  Range of Motion/Exercises Active;All extremities  Activity Response Tolerated well  Mobility Referral Yes  Mobility visit 1 Mobility  Mobility Specialist Start Time (ACUTE ONLY) [phone]  Mobility Specialist Stop Time (ACUTE ONLY) [id number]  Mobility Specialist Time Calculation (min) (ACUTE ONLY) 20 min   Pt received in chair, MD requesting O2 test. Required SBA to stand and ambulate with RW. Tolerated well, O2 sats below. Returned to chair, call bell in reach. All needs met.  SpO2: 85% on RA at rest. SpO2:88% 2L rest. SpO2: 93% 3L at rest. SpO2: 90% 3L ambulation.  Jefrey Raburn Mobility Specialist Please contact via Special Educational Needs Teacher or  Rehab office at [phone]

## 2024-03-07 NOTE — Plan of Care (Signed)
   Problem: Education: Goal: Knowledge of General Education information will improve Description Including pain rating scale, medication(s)/side effects and non-pharmacologic comfort measures Outcome: Progressing   Problem: Health Behavior/Discharge Planning: Goal: Ability to manage health-related needs will improve Outcome: Progressing

## 2024-03-07 NOTE — Progress Notes (Signed)
 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 She reported associated shortness of breath, productive cough, bilateral lower-extremity edema, and diarrhea. On arrival to the emergency department, she was tachypneic and severely hypoxic, requiring a nonrebreather followed by BiPAP support. She tested positive for influenza A, and CXR demonstrated diffuse right-sided airspace opacities with possible associated effusion.  She was admitted to the [facility] hospitalist service for management of acute hypoxic respiratory failure due to severe right-sided pneumonia with concomitant influenza A infection and acute asthma exacerbation, complicated by sepsis. She has continued to need BiPAP with breaks using HFNC, getting broad-spectrum IV antibiotics and tamiflu , steroids, and bronchodilators. There acute on chronic HFpEF with evidence of volume overload, treated with cautious IV diuresis, and transient hypoglycemia leading to temporary holding of antiglycemic agents. Diarrhea was felt to be viral in origin with C. difficile ruled out.   On 03/05/24, she has transitioned off BiPAP to HFNC at 8L O2.   Subjective: Cough and dyspnea improving -SpO2: 85% on RA at rest. SpO2:88% 2L rest. SpO2: 93% 3L at rest. SpO2: 90% 3L ambulation. Voiding well - Hypoxia improving currently weaned down to 3 L of oxygen  via nasal cannula from 8 L - Dyspnea on exertion improving  Objective: BP 117/60 (BP Location: Right Wrist)   Pulse 61   Temp 98.7 F (37.1 C) (Oral)   Resp 17   Ht 5' (1.524 m)   Wt 89 kg   LMP 07/29/2013   SpO2  94%   BMI 38.32 kg/m     Physical Exam Gen:- Awake Alert, in no acute distress  HEENT:- [place].AT, No sclera icterus Nose- [place] 3L/min Neck-Supple Neck,No JVD,.  Lungs-improving air movement, no significant wheezing CV- S1, S2 normal, RRR Abd-  +ve B.Sounds, Abd Soft, No tenderness,    Extremity/Skin:- No  edema,   good pedal pulses  Psych-affect is appropriate, oriented x3 Neuro-no new focal deficits, no tremors  Assessment & Plan: 1)Acute Hypoxic Respiratory Failure: due to influenza A and superimposed PNA causing Asthma Exacerbation. - Treated with IV Solu-Medrol , azithromycin , mucolytics and bronchodilators - Respiratory status improving - No longer requiring BiPAP - No longer requiring high flow nasal cannula -SpO2: 85% on RA at rest. -SpO2:88% 2L rest. -SpO2: 93% 3L at rest. -SpO2: 90% 3L ambulation. - Hypoxia improving currently weaned down to 3 L of oxygen  via nasal cannula from 8 L -- Okay to complete Tamiflu   2)Sepsis due to Pneumonia- - Hemodynamics stable, hypertensive.  -Linezolid  previously discontinued given negative MRSA PCR -Okay to de-escalate from Zosyn  to Cefuroxime  -Continue Azithromycin  - Continue Tamiflu .  3)Acute Asthma Exacerbation (severe persistent asthma history): Long History of exacerbations (2016+) and very severe childhood course (tracheostomy at age 46) ? high-risk phenotype. -Respiratory status improving as outlined above #1 - Continue IV steroids - Continue scheduled DuoNebs + PRN - Continue montelukast .   4)Acute on chronic HFpEF: proBNP 7,225, BLE edema is per pt at baseline. - Change Torsemide  to 20 mg bid -C/n daily weight and Input/output  5)HTN: Overall, BPs rising significantly now.  - c/n Amlodipine , hydralazine , and will hold beta blocker with recent bronchospasm and baseline low-normal HR  6)Stage  IIIb CKD:  -Creatinine down to 1.67 from 1.91 on admission -Monitor renal function closely while on torsemide  - renally adjust  medications, avoid nephrotoxic agents / dehydration  / hypotension  7)IDT2DM: Well-controlled based on HbA1c 5.1%.  - DC insulin  pump, can restart once more stable - Use Novolog /Humalog  Sliding scale insulin  with Accu-Cheks/Fingersticks as ordered   8)Mild Troponin Elevation (hs-TnT 66 ? 72) - likely demand ischemia / myocardial strain: Prior cath (2015) with normal coronaries.  -Remains chest pain   9)Thrombocytopenia (Plt 123) and mild anemia (Hgb 11.9): Possibly related to flu.  - Platelets have normalized - Hgb around 11  10)Recent Panniculectomy (01/29/24):  - Continue wound care consult and local care. Cleanse incision with Vashe and apply Xeroform to any open areas. Healing fairly well. Will continue outpatient plastics follow up after discharge.    Rendall Carwin, MD [facility] [email or web address] 03/07/2024, 2:28 PM

## 2024-03-07 NOTE — Care Management Important Message (Signed)
 Important Message  Patient Details  Name: [name] MRN: [id number] Date of Birth: January 06, 1966   Important Message Given:  Yes - Medicare IM     Akshaya Toepfer L Jahel Wavra 03/07/2024, 9:16 AM

## 2024-03-07 NOTE — Plan of Care (Signed)
  Problem: Clinical Measurements: Goal: Will remain free from infection Outcome: Progressing Goal: Respiratory complications will improve Outcome: Progressing   Problem: Activity: Goal: Risk for activity intolerance will decrease Outcome: Progressing   Problem: Coping: Goal: Level of anxiety will decrease Outcome: Progressing   Problem: Pain Managment: Goal: General experience of comfort will improve and/or be controlled Outcome: Progressing   Problem: Safety: Goal: Ability to remain free from injury will improve Outcome: Progressing   Problem: Skin Integrity: Goal: Risk for impaired skin integrity will decrease Outcome: Progressing

## 2024-03-08 LAB — CULTURE, BLOOD (ROUTINE X 2)
Culture: NO GROWTH
Culture: NO GROWTH
Special Requests: ADEQUATE
Special Requests: ADEQUATE

## 2024-03-08 LAB — GLUCOSE, CAPILLARY
Glucose-Capillary: 199 mg/dL — ABNORMAL HIGH (ref 70–99)
Glucose-Capillary: 245 mg/dL — ABNORMAL HIGH (ref 70–99)
Glucose-Capillary: 270 mg/dL — ABNORMAL HIGH (ref 70–99)

## 2024-03-08 LAB — RENAL FUNCTION PANEL
Albumin: 3.3 g/dL — ABNORMAL LOW (ref 3.5–5.0)
Anion gap: 6 (ref 5–15)
BUN: 32 mg/dL — ABNORMAL HIGH (ref 6–20)
CO2: 31 mmol/L (ref 22–32)
Calcium: 8.7 mg/dL — ABNORMAL LOW (ref 8.9–10.3)
Chloride: 102 mmol/L (ref 98–111)
Creatinine, Ser: 1.36 mg/dL — ABNORMAL HIGH (ref 0.44–1.00)
GFR, Estimated: 45 mL/min — ABNORMAL LOW
Glucose, Bld: 219 mg/dL — ABNORMAL HIGH (ref 70–99)
Phosphorus: 2.8 mg/dL (ref 2.5–4.6)
Potassium: 4.1 mmol/L (ref 3.5–5.1)
Sodium: 139 mmol/L (ref 135–145)

## 2024-03-08 MED ORDER — CEFUROXIME AXETIL 500 MG PO TABS: 500.0000 mg | ORAL_TABLET | Freq: Two times a day (BID) | ORAL | 0 refills | Status: AC

## 2024-03-08 MED ORDER — HYDRALAZINE HCL 25 MG PO TABS: 25.0000 mg | ORAL_TABLET | Freq: Three times a day (TID) | ORAL | 3 refills | Status: AC

## 2024-03-08 MED ORDER — TAMSULOSIN HCL 0.4 MG PO CAPS: 0.4000 mg | ORAL_CAPSULE | Freq: Every day | ORAL | 0 refills | Status: AC

## 2024-03-08 MED ORDER — PREDNISONE 20 MG PO TABS: 40.0000 mg | ORAL_TABLET | Freq: Every day | ORAL | 0 refills | Status: AC

## 2024-03-08 MED ORDER — ASPIRIN 81 MG PO CHEW: 81.0000 mg | CHEWABLE_TABLET | Freq: Every day | ORAL | 5 refills | Status: AC

## 2024-03-08 MED ORDER — AMLODIPINE BESYLATE 5 MG PO TABS: 5.0000 mg | ORAL_TABLET | Freq: Every day | ORAL | 2 refills | Status: AC

## 2024-03-08 MED ORDER — ALBUTEROL SULFATE HFA 108 (90 BASE) MCG/ACT IN AERS: 2.0000 | INHALATION_SPRAY | Freq: Four times a day (QID) | RESPIRATORY_TRACT | 3 refills | Status: AC | PRN

## 2024-03-08 MED ORDER — CARVEDILOL 6.25 MG PO TABS: 6.2500 mg | ORAL_TABLET | Freq: Two times a day (BID) | ORAL | 5 refills | Status: AC

## 2024-03-08 MED ORDER — AZITHROMYCIN 500 MG PO TABS: 500.0000 mg | ORAL_TABLET | Freq: Every day | ORAL | 0 refills | Status: AC

## 2024-03-08 MED ORDER — ALBUTEROL SULFATE (2.5 MG/3ML) 0.083% IN NEBU: 2.5000 mg | INHALATION_SOLUTION | Freq: Four times a day (QID) | RESPIRATORY_TRACT | 12 refills | Status: AC | PRN

## 2024-03-08 MED ORDER — GUAIFENESIN ER 600 MG PO TB12: 600.0000 mg | ORAL_TABLET | Freq: Two times a day (BID) | ORAL | 0 refills | Status: AC

## 2024-03-08 MED ORDER — TORSEMIDE 20 MG PO TABS: 20.0000 mg | ORAL_TABLET | Freq: Every day | ORAL | 5 refills | Status: AC

## 2024-03-08 NOTE — Discharge Instructions (Signed)
 1)Very Low-salt diet advised---Less than 2 gm of Sodium per day advised----ok to use Mrs DASH salt substitute instead of Salt 2)Weigh yourself daily, call if you gain more than 3 pounds in 1 day or more than 5 pounds in 1 week as your diuretic medications may need to be adjusted 3)Avoid ibuprofen /Advil /Aleve/Motrin Josefine Powders/Naproxen/BC powders/Meloxicam/Diclofenac/Indomethacin and other Nonsteroidal anti-inflammatory medications as these will make you more likely to bleed and can cause stomach ulcers, can also cause Kidney problems.   4)follow up with Leigh Lung, MD (primary care physician) in 1 week for recheck and reevaluation  5) repeat BMP blood test in about a week or so

## 2024-03-08 NOTE — Plan of Care (Signed)
" °  Problem: Clinical Measurements: Goal: Will remain free from infection Outcome: Progressing Goal: Diagnostic test results will improve Outcome: Progressing Goal: Respiratory complications will improve Outcome: Progressing   Problem: Activity: Goal: Risk for activity intolerance will decrease Outcome: Progressing   Problem: Coping: Goal: Level of anxiety will decrease Outcome: Progressing   Problem: Elimination: Goal: Will not experience complications related to bowel motility Outcome: Progressing Goal: Will not experience complications related to urinary retention Outcome: Progressing   Problem: Pain Managment: Goal: General experience of comfort will improve and/or be controlled Outcome: Progressing   Problem: Safety: Goal: Ability to remain free from injury will improve Outcome: Progressing   Problem: Skin Integrity: Goal: Risk for impaired skin integrity will decrease Outcome: Progressing   Problem: Skin Integrity: Goal: Risk for impaired skin integrity will decrease Outcome: Progressing   "

## 2024-03-08 NOTE — Discharge Summary (Signed)
 "                                                                                 [name], is a 59 y.o. female  DOB [birth year]  MRN [id number].  Admission date:  03/03/2024  Admitting Physician  Terry LOISE Hurst, DO  Discharge Date:  03/08/2024   Primary MD  Leigh Lung, MD  Recommendations for primary care physician for things to follow:  1)Very Low-salt diet advised---Less than 2 gm of Sodium per day advised----ok to use Mrs DASH salt substitute instead of Salt 2)Weigh yourself daily, call if you gain more than 3 pounds in 1 day or more than 5 pounds in 1 week as your diuretic medications may need to be adjusted 3)Avoid ibuprofen /Advil /Aleve/Motrin Josefine Powders/Naproxen/BC powders/Meloxicam/Diclofenac/Indomethacin and other Nonsteroidal anti-inflammatory medications as these will make you more likely to bleed and can cause stomach ulcers, can also cause Kidney problems.   4)follow up with Leigh Lung, MD (primary care physician) in 1 week for recheck and reevaluation  5) repeat BMP blood test in about a week or so  Admission Diagnosis  Acute severe exacerbation of asthma [J45.901] Community acquired pneumonia of right lung, unspecified part of lung [J18.9] Acute hypoxic respiratory failure (HCC) [J96.01]   Discharge Diagnosis  Acute severe exacerbation of asthma [J45.901] Community acquired pneumonia of right lung, unspecified part of lung [J18.9] Acute hypoxic respiratory failure (HCC) [J96.01]    Principal Problem:   Acute hypoxic respiratory failure (HCC)      Past Medical History:  Diagnosis Date   Acute asthma exacerbation 12/21/2014   Acute renal failure    ARF (acute renal failure) 05/03/2014   Asthma    Asthma exacerbation 05/03/2014   Asthma, severe persistent (HCC) 05/03/2014   Chronic diastolic CHF (congestive heart failure) (HCC) 06/21/2018   Diabetes mellitus    Diabetes mellitus type 2 in obese 05/03/2014   History of echocardiogram 04/2014   LVH,  diastolic dysfunction   Hyperlipidemia    Hypertension    Hyponatremia 12/02/2014   Obesity    Preop examination 05/18/2017   SOB (shortness of breath) 12/22/2014    Past Surgical History:  Procedure Laterality Date   CARDIAC CATHETERIZATION  03/2013   normal coronary arteries, EF 55%   CARDIAC CATHETERIZATION  03/2013   normal coronary arteries   CATARACT EXTRACTION W/PHACO Right 12/10/2012   Procedure: CATARACT EXTRACTION PHACO AND INTRAOCULAR LENS PLACEMENT (IOC);  Surgeon: Cherene Mania, MD;  Location: [facility];  Service: Ophthalmology;  Laterality: Right;  CDE:22..42   CATARACT EXTRACTION W/PHACO Left 05/11/2015   Procedure: CATARACT EXTRACTION PHACO AND INTRAOCULAR LENS PLACEMENT LEFT EYE CDE=6.54;  Surgeon: Cherene Mania, MD;  Location: [facility];  Service: Ophthalmology;  Laterality: Left;   COLONOSCOPY N/A 09/22/2017   Procedure: COLONOSCOPY;  Surgeon: Harvey Margo CROME, MD;  Location: [facility];  Service: Endoscopy;  Laterality: N/A;  12:15   COLONOSCOPY WITH PROPOFOL  N/A 09/27/2021   Procedure: COLONOSCOPY WITH PROPOFOL ;  Surgeon: Cindie Carlin POUR, DO;  Location: [facility];  Service: Endoscopy;  Laterality: N/A;  8:15am   EYE SURGERY Left    GASTRIC BYPASS  05/01/2018   GASTRIC ROUX-EN-Y N/A  05/01/2018   Procedure: LAPAROSCOPIC ROUX-EN-Y GASTRIC BYPASS WITH UPPER ENDOSCOPY WITH ERAS PATHWAY;  Surgeon: Kinsinger, Herlene Righter, MD;  Location: [facility];  Service: General;  Laterality: N/A;   LEFT HEART CATHETERIZATION WITH CORONARY ANGIOGRAM N/A 04/09/2013   Procedure: LEFT HEART CATHETERIZATION WITH CORONARY ANGIOGRAM;  Surgeon: Erick JONELLE Bergamo, MD;  Location: [facility] CATH LAB;  Service: Cardiovascular;  Laterality: N/A;   Loop recorder implantation     Insertion of [employer]ist. Serial # [id number] 09/29/21 for heart failure research   PANNICULECTOMY Bilateral 01/29/2024   Procedure: PANNICULECTOMY;  Surgeon: Montorfano, Lisandro M, MD;  Location: [facility];  Service:  Plastics;  Laterality: Bilateral;   TRACHEOSTOMY     at age 44 from asthma attack     HPI  from the history and physical done on the day of admission:    Chief Complaint: Shortness of breath, respiratory distress, and severe hypoxia.   HPI: [name] is a 59 y.o. female with medical history significant for panniculectomy (01/29/24), severe morbid obesity, asthma, type 2 diabetes, hypertension, hyperlipidemia, chronic HFpEF, CKD 3B, who presents to the ER from home due to shortness of breath, respiratory distress, and severe hypoxia with O2 saturation in the 40% per EMS.  Associated with bilateral lower extremity edema, productive cough, and diarrhea.  The patient was placed on nonrebreather 15 L.  She was brought into the ER for further evaluation.   In the ER, O2 saturation 65% on room air, tachypnea with respiration rate 32.  Influenza A+.  The patient was placed on BiPAP.  Chest x-ray showed diffuse airspace opacity noted in right lung more consistent with pneumonia and possible associated effusion.  Chest x-ray is recommended in 3 to 4 weeks following trial of antibiotic therapy and ensure resolution and exclude underlying malignancy.   Admitted by [facility], hospitalist service.   ED Course: Temperature 97.4.  BP 141/60, pulse 63, respiratory rate 20, O2 saturation 97% on BiPAP.   Review of Systems: Review of systems as noted in the HPI. All other systems reviewed and are negative.    Hospital Course:    Assessment and Plan: 1)Acute Hypoxic Respiratory Failure: due to influenza A and superimposed PNA causing Asthma Exacerbation. - Treated with IV Solu-Medrol , azithromycin , mucolytics and bronchodilators - Respiratory status improved significantly -Patient initially required BiPAP and high flow nasal cannula -Currently completely weaned off oxygen  -Post ambulation O2 sats on room air is 92 to 93% -- Okay to complete Tamiflu    2)Sepsis due to Pneumonia- -Linezolid  previously  discontinued given negative MRSA PCR -de-escalated from Zosyn  to Cefuroxime  and  Azithromycin   3)Acute Asthma Exacerbation (severe persistent asthma history): Long History of exacerbations (2016+) and very severe childhood course (tracheostomy at age 72) ? high-risk phenotype. -Respiratory status improving as outlined above #1 -Much improved on IV Solu-Medrol  along with bronchodilators and mucolytics - Continue montelukast . -Okay to discharge on prednisone    4)Acute on chronic HFpEF: proBNP 7,225, BLE edema is per pt at baseline. - Change Torsemide  to 20 mg bid -C/n daily weight and Input/output   5)HTN: Stable, c/n Amlodipine , hydralazine , and Coreg   6)Stage IIIb CKD:  -Creatinine down to 1.36 from 1.91 on admission - renally adjust medications, avoid nephrotoxic agents / dehydration  / hypotension   7)IDT2DM: Well-controlled based on HbA1c 5.1%.  -Restart insulin  pump upon discharge  8)Mild Troponin Elevation (hs-TnT 66 ? 72) - likely demand ischemia / myocardial strain: Prior cath (2015) with normal coronaries.  -Remains chest pain free -  Ambulating without any symptoms   9)Thrombocytopenia (Plt 123) and mild anemia (Hgb 11.9): Possibly related to flu.  - Platelets have normalized - Hgb around 11  10)Recent Panniculectomy (01/29/24):  - Continue wound care consult and local care. Cleanse incision with Vashe and apply Xeroform to any open areas. Healing fairly well. Will continue outpatient plastics follow up after discharge.   11)Hypothyroidism--- continue levothyroxine   12)Class II obesity- -Low calorie diet, portion control and increase physical activity discussed with patient -Body mass index is 38.28 kg/m.   Discharge Condition: Stable without hypoxia  Follow UP   Follow-up Information     Leigh Lung, MD. Schedule an appointment as soon as possible for a visit in 1 week(s).   Specialty: Family Medicine Why: Repeat BMP Contact information: [address] [place] [address] [phone]                 Diet and Activity recommendation:  As advised  Discharge Instructions    Discharge Instructions     Call MD for:  persistant dizziness or light-headedness   Complete by: As directed    Call MD for:  persistant nausea and vomiting   Complete by: As directed    Call MD for:  redness, tenderness, or signs of infection (pain, swelling, redness, odor or green/yellow discharge around incision site)   Complete by: As directed    Call MD for:  severe uncontrolled pain   Complete by: As directed    Call MD for:  temperature >100.4   Complete by: As directed    Diet - low sodium heart healthy   Complete by: As directed    Diet Carb Modified   Complete by: As directed    Discharge instructions   Complete by: As directed    1)Very Low-salt diet advised---Less than 2 gm of Sodium per day advised----ok to use Mrs DASH salt substitute instead of Salt 2)Weigh yourself daily, call if you gain more than 3 pounds in 1 day or more than 5 pounds in 1 week as your diuretic medications may need to be adjusted 3)Avoid ibuprofen /Advil /Aleve/Motrin /Goody Powders/Naproxen/BC powders/Meloxicam/Diclofenac/Indomethacin and other Nonsteroidal anti-inflammatory medications as these will make you more likely to bleed and can cause stomach ulcers, can also cause Kidney problems.   4)follow up with Leigh Lung, MD (primary care physician) in 1 week for recheck and reevaluation  5) repeat BMP blood test in about a week or so   Discharge wound care:   Complete by: As directed    As above   Increase activity slowly   Complete by: As directed          Discharge Medications     Allergies as of 03/08/2024   No Known Allergies      Medication List     TAKE these medications    Accu-Chek Guide Test test strip Generic drug: glucose blood 2 (two) times daily.   acetaminophen  500 MG tablet Commonly known as: TYLENOL  Take 1 tablet (500 mg  total) by mouth every 6 (six) hours as needed.   ADULT GUMMY PO Take 1 capsule by mouth daily.   albuterol  108 (90 Base) MCG/ACT inhaler Commonly known as: VENTOLIN  HFA Inhale 2 puffs into the lungs every 6 (six) hours as needed for wheezing.   albuterol  (2.5 MG/3ML) 0.083% nebulizer solution Commonly known as: PROVENTIL  Take 3 mLs (2.5 mg total) by nebulization every 6 (six) hours as needed for wheezing or shortness of breath.   amLODipine   5 MG tablet Commonly known as: NORVASC  Take 1 tablet (5 mg total) by mouth daily. Start taking on: March 10, 2024 What changed:  These instructions start on March 10, 2024. If you are unsure what to do until then, ask your doctor or other care provider. Another medication with the same name was removed. Continue taking this medication, and follow the directions you see here.   aspirin  81 MG chewable tablet Chew 1 tablet (81 mg total) by mouth daily with breakfast. What changed: when to take this   atorvastatin  40 MG tablet Commonly known as: LIPITOR Take 40 mg by mouth daily.   azithromycin  500 MG tablet Commonly known as: ZITHROMAX  Take 1 tablet (500 mg total) by mouth daily for 3 days.   butalbital -acetaminophen -caffeine  50-325-40 MG tablet Commonly known as: FIORICET Take 1 tablet by mouth every 6 (six) hours as needed for headache.   calcitRIOL 0.25 MCG capsule Commonly known as: ROCALTROL Take 0.25 mcg by mouth daily.   CALCIUM  600+D PO Take 1 tablet by mouth daily.   carboxymethylcellulose 0.5 % Soln Commonly known as: REFRESH PLUS Place 1 drop into both eyes 3 (three) times daily as needed (dry eyes).   carvedilol  6.25 MG tablet Commonly known as: COREG  Take 1 tablet (6.25 mg total) by mouth 2 (two) times daily with a meal. What changed: when to take this   cefUROXime  500 MG tablet Commonly known as: CEFTIN  Take 1 tablet (500 mg total) by mouth 2 (two) times daily with a meal for 3 days.   Farxiga 5 MG Tabs  tablet Generic drug: dapagliflozin propanediol Take 5 mg by mouth daily.   gabapentin  300 MG capsule Commonly known as: NEURONTIN  Take 1 capsule (300 mg total) by mouth 4 (four) times daily. What changed: when to take this   guaiFENesin  600 MG 12 hr tablet Commonly known as: Mucinex  Take 1 tablet (600 mg total) by mouth 2 (two) times daily.   HumaLOG  100 UNIT/ML injection Generic drug: insulin  lispro Uses in insulin  pump   hydrALAZINE  25 MG tablet Commonly known as: APRESOLINE  Take 1 tablet (25 mg total) by mouth 3 (three) times daily. Start taking on: March 09, 2024 What changed:  how much to take Another medication with the same name was removed. Continue taking this medication, and follow the directions you see here.   insulin  pump Soln Inject into the skin.   levothyroxine  75 MCG tablet Commonly known as: SYNTHROID  Take 75 mcg by mouth daily before breakfast.   lidocaine  5 % Commonly known as: Lidoderm  Place 1 patch onto the skin daily as needed. Remove & Discard patch within 12 hours or as directed by MD   montelukast  10 MG tablet Commonly known as: SINGULAIR  Take 10 mg by mouth at bedtime.   nystatin  powder Commonly known as: MYCOSTATIN /NYSTOP  Apply 1 Application topically 3 (three) times daily.   ondansetron  4 MG disintegrating tablet Commonly known as: ZOFRAN -ODT Take 1 tablet (4 mg total) by mouth every 8 (eight) hours as needed for nausea or vomiting.   oxyCODONE  5 MG immediate release tablet Commonly known as: Roxicodone  Take 1 tablet (5 mg total) by mouth every 4 (four) hours as needed for severe pain (pain score 7-10).   Ozempic  (2 MG/DOSE) 8 MG/3ML Sopn Generic drug: Semaglutide  (2 MG/DOSE) INJECT 2MG  INTO THE SKIN ONCE A WEEK   predniSONE  20 MG tablet Commonly known as: DELTASONE  Take 2 tablets (40 mg total) by mouth daily with breakfast for 5 days.   tamsulosin   0.4 MG Caps capsule Commonly known as: FLOMAX  Take 1 capsule (0.4 mg total)  by mouth daily after supper.   torsemide  20 MG tablet Commonly known as: DEMADEX  Take 1 tablet (20 mg total) by mouth daily. What changed: when to take this   Vitamin D  50 MCG (2000 UT) tablet Take 2,000 Units by mouth daily.   Xiidra  5 % Soln Generic drug: Lifitegrast  Place 1 drop into both eyes in the morning and at bedtime.               Discharge Care Instructions  (From admission, onward)           Start     Ordered   03/08/24 0000  Discharge wound care:       Comments: As above   03/08/24 1020            Major procedures and Radiology Reports - PLEASE review detailed and final reports for all details, in brief -   DG CHEST PORT 1 VIEW Result Date: 03/04/2024 EXAM: 1 VIEW(S) XRAY OF THE CHEST 03/04/2024 05:46:30 PM COMPARISON: 03/03/2024 CLINICAL HISTORY: Pleural effusion on right 288745; CAP (community acquired pneumonia) 759315; Acute hypoxic respiratory failure (HCC) 8228802 FINDINGS: LUNGS AND PLEURA: Persistent diffuse right lung airspace opacity. Slightly increased patchy left lung airspace opacities. Small right pleural effusion. No pneumothorax. HEART AND MEDIASTINUM: No acute abnormality of the cardiac and mediastinal silhouettes. BONES AND SOFT TISSUES: No acute osseous abnormality. IMPRESSION: 1. Persistent diffuse right lung airspace opacity and slightly increased patchy left lung airspace opacities, consistent with pneumonia. 2. Small right pleural effusion. Electronically signed by: Elsie Gravely MD 03/04/2024 10:15 PM EST RP Workstation: [id number]   DG Chest Portable 1 View Result Date: 03/03/2024 CLINICAL DATA:  Shortness of breath EXAM: PORTABLE CHEST 1 VIEW COMPARISON:  November 26, 2023 FINDINGS: Stable cardiomediastinal silhouette. Left lung is clear. Diffuse airspace opacity is noted in right lung most consistent with pneumonia and possible associated effusion. Bony thorax is unremarkable. IMPRESSION: Diffuse airspace opacity is noted in right  lung most consistent with pneumonia and possible associated effusion. Followup PA and lateral chest X-ray is recommended in 3-4 weeks following trial of antibiotic therapy to ensure resolution and exclude underlying malignancy. Electronically Signed   By: Lynwood Landy Raddle M.D.   On: 03/03/2024 17:06   CT Head Wo Contrast Result Date: 02/21/2024 CLINICAL DATA:  Headache EXAM: CT HEAD WITHOUT CONTRAST TECHNIQUE: Contiguous axial images were obtained from the base of the skull through the vertex without intravenous contrast. RADIATION DOSE REDUCTION: This exam was performed according to the departmental dose-optimization program which includes automated exposure control, adjustment of the mA and/or kV according to patient size and/or use of iterative reconstruction technique. COMPARISON:  None Available. FINDINGS: Brain: No evidence of acute infarction, hemorrhage, hydrocephalus, extra-axial collection or mass lesion/mass effect. Vascular: No hyperdense vessel or unexpected calcification. Skull: Normal. Negative for fracture or focal lesion. Sinuses/Orbits: Mild mucosal thickening in the ethmoid sinuses Other: None IMPRESSION: Negative non contrasted CT appearance of the brain. Electronically Signed   By: Luke Bun M.D.   On: 02/21/2024 19:35   US  Venous Img Lower Unilateral Left Result Date: 02/19/2024 CLINICAL DATA:  LLE Swelling s/p panniculectomy surgery EXAM: LEFT LOWER EXTREMITY VENOUS DOPPLER ULTRASOUND TECHNIQUE: Gray-scale sonography with compression, as well as color and duplex ultrasound, were performed to evaluate the deep venous system(s) from the level of the common femoral vein through the popliteal and proximal calf veins. COMPARISON:  Chest XR,  11/18/2023. FINDINGS: VENOUS Normal compressibility of the common femoral, superficial femoral, and popliteal veins, as well as the visualized calf veins. Visualized portions of profunda femoral vein and great saphenous vein unremarkable. No filling  defects to suggest DVT on grayscale or color Doppler imaging. Doppler waveforms show normal direction of venous flow, normal respiratory plasticity and response to augmentation. Limited views of the contralateral common femoral vein are unremarkable. OTHER Subcutaneous edema of the imaged distal LEFT lower extremity No evidence of superficial thrombophlebitis or abnormal fluid collection. Prominent LEFT inguinal lymph node, measuring 1 cm in transaxial dimension. Limitations: Patient body habitus IMPRESSION: Suboptimal evaluation, within these constraints; 1. No evidence of femoropopliteal DVT or superficial thrombophlebitis within the LEFT lower extremity. 2. Prominent LEFT inguinal lymph node, likely reactive. Electronically Signed   By: Thom Hall M.D.   On: 02/19/2024 13:33    Micro Results   Recent Results (from the past 240 hours)  Resp panel by RT-PCR (RSV, Flu A&B, Covid) Anterior Nasal Swab     Status: Abnormal   Collection Time: 03/03/24  4:54 PM   Specimen: Anterior Nasal Swab  Result Value Ref Range Status   SARS Coronavirus 2 by RT PCR NEGATIVE NEGATIVE Final    Comment: (NOTE) SARS-CoV-2 target nucleic acids are NOT DETECTED.  The SARS-CoV-2 RNA is generally detectable in upper respiratory specimens during the acute phase of infection. The lowest concentration of SARS-CoV-2 viral copies this assay can detect is 138 copies/mL. A negative result does not preclude SARS-Cov-2 infection and should not be used as the sole basis for treatment or other patient management decisions. A negative result may occur with  improper specimen collection/handling, submission of specimen other than nasopharyngeal swab, presence of viral mutation(s) within the areas targeted by this assay, and inadequate number of viral copies(<138 copies/mL). A negative result must be combined with clinical observations, patient history, and epidemiological information. The expected result is Negative.  Fact  Sheet for Patients:  bloggercourse.com  Fact Sheet for Healthcare Providers:  seriousbroker.it  This test is no t yet approved or cleared by the [place]  FDA and  has been authorized for detection and/or diagnosis of SARS-CoV-2 by FDA under an Emergency Use Authorization (EUA). This EUA will remain  in effect (meaning this test can be used) for the duration of the COVID-19 declaration under Section 564(b)(1) of the Act, 21 U.S.C.section 360bbb-3(b)(1), unless the authorization is terminated  or revoked sooner.       Influenza A by PCR POSITIVE (A) NEGATIVE Final   Influenza B by PCR NEGATIVE NEGATIVE Final    Comment: (NOTE) The Xpert Xpress SARS-CoV-2/FLU/RSV plus assay is intended as an aid in the diagnosis of influenza from Nasopharyngeal swab specimens and should not be used as a sole basis for treatment. Nasal washings and aspirates are unacceptable for Xpert Xpress SARS-CoV-2/FLU/RSV testing.  Fact Sheet for Patients: bloggercourse.com  Fact Sheet for Healthcare Providers: seriousbroker.it  This test is not yet approved or cleared by the [place]  FDA and has been authorized for detection and/or diagnosis of SARS-CoV-2 by FDA under an Emergency Use Authorization (EUA). This EUA will remain in effect (meaning this test can be used) for the duration of the COVID-19 declaration under Section 564(b)(1) of the Act, 21 U.S.C. section 360bbb-3(b)(1), unless the authorization is terminated or revoked.     Resp Syncytial Virus by PCR NEGATIVE NEGATIVE Final    Comment: (NOTE) Fact Sheet for Patients: bloggercourse.com  Fact Sheet for Healthcare Providers: seriousbroker.it  This  test is not yet approved or cleared by the [place]  FDA and has been authorized for detection and/or diagnosis of SARS-CoV-2 by FDA  under an Emergency Use Authorization (EUA). This EUA will remain in effect (meaning this test can be used) for the duration of the COVID-19 declaration under Section 564(b)(1) of the Act, 21 U.S.C. section 360bbb-3(b)(1), unless the authorization is terminated or revoked.  Performed at [facility], [address], [place], [place] [address]   C Difficile Quick Screen w PCR reflex     Status: None   Collection Time: 03/03/24  5:31 PM   Specimen: STOOL  Result Value Ref Range Status   C Diff antigen NEGATIVE NEGATIVE Final   C Diff toxin NEGATIVE NEGATIVE Final   C Diff interpretation No C. difficile detected.  Final    Comment: Performed at [facility], [address] [place], [place] [address]  Culture, blood (routine x 2)     Status: None   Collection Time: 03/03/24  6:03 PM   Specimen: BLOOD LEFT HAND  Result Value Ref Range Status   Specimen Description BLOOD LEFT HAND  Final   Special Requests   Final    BOTTLES DRAWN AEROBIC AND ANAEROBIC Blood Culture adequate volume   Culture   Final    NO GROWTH 5 DAYS Performed at [facility] [place] [facility], [address], [place], [place] [address]    Report Status 03/08/2024 FINAL  Final  Culture, blood (routine x 2)     Status: None   Collection Time: 03/03/24  6:20 PM   Specimen: Right Antecubital; Blood  Result Value Ref Range Status   Specimen Description RIGHT ANTECUBITAL  Final   Special Requests   Final    BOTTLES DRAWN AEROBIC AND ANAEROBIC Blood Culture adequate volume   Culture   Final    NO GROWTH 5 DAYS Performed at [facility], [address], [place], [place] [address]    Report Status 03/08/2024 FINAL  Final  MRSA Next Gen by PCR, Nasal     Status: None   Collection Time: 03/03/24 10:03 PM   Specimen: Nasal Mucosa; Nasal Swab  Result Value Ref Range Status   MRSA by PCR Next Gen NOT DETECTED NOT DETECTED Final    Comment: (NOTE) The GeneXpert MRSA Assay (FDA approved for NASAL specimens only), is one component of a  comprehensive MRSA colonization surveillance program. It is not intended to diagnose MRSA infection nor to guide or monitor treatment for MRSA infections. Test performance is not FDA approved in patients less than 80 years old. Performed at [facility], [address], [place], [place] [address]    Today   Subjective    Hadlyn Amero today has no new complaints No fever  Or chills   No Nausea, Vomiting or Diarrhea - Ambulated in hallways O2 sats 92 to 93% on room air - No chest pain, no  palpitations no significant dyspnea          Patient has been seen and examined prior to discharge   Objective   Blood pressure 110/60, pulse (!) 58, temperature 97.8 F (36.6 C), resp. rate 18, height 5' (1.524 m), weight 88.9 kg, last menstrual period 07/29/2013, SpO2 97%.   Intake/Output Summary (Last 24 hours) at 03/08/2024 1241 Last data filed at 03/08/2024 0416 Gross per 24 hour  Intake 240 ml  Output 1750 ml  Net -1510 ml    Exam Gen:- Awake Alert, no acute distress, no significant dyspnea on exertion HEENT:-  [place].AT, No sclera icterus Neck-Supple Neck,No JVD,.  Lungs-much improved air movement, no wheezing CV- S1, S2 normal, regular Abd-  +ve B.Sounds, Abd Soft, No tenderness, healing postoperative wounds.,  Increased truncal adiposity Extremity/Skin:- No  edema,   good pulses Psych-affect is appropriate, oriented x3 Neuro-no new focal deficits, no tremors    Data Review   CBC w Diff:  Lab Results  Component Value Date   WBC 7.3 03/06/2024   HGB 10.9 (L) 03/06/2024   HCT 34.5 (L) 03/06/2024   PLT 173 03/06/2024   LYMPHOPCT 13 03/03/2024   MONOPCT 4 03/03/2024   EOSPCT 0 03/03/2024   BASOPCT 0 03/03/2024   CMP:  Lab Results  Component Value Date   NA 139 03/08/2024   NA 144 06/28/2022   K 4.1 03/08/2024   CL 102 03/08/2024   CO2 31 03/08/2024   BUN 32 (H) 03/08/2024   BUN 31 (H) 06/28/2022   CREATININE 1.36 (H) 03/08/2024   PROT 7.3 03/03/2024   ALBUMIN  3.3 (L) 03/08/2024   BILITOT 0.3 03/03/2024   ALKPHOS 120 03/03/2024   AST 62 (H) 03/03/2024   ALT 30 03/03/2024   Total Discharge time is about 33 minutes  Rendall Carwin M.D on 03/08/2024 at 12:41 PM  Go to [email or web address] -  for contact info  [facility] - Office  [phone]   "

## 2024-03-11 ENCOUNTER — Telehealth: Payer: Self-pay

## 2024-03-11 NOTE — Transitions of Care (Post Inpatient/ED Visit) (Signed)
" ° °  03/11/2024  Name: [name] MRN: [id number] DOB: [birth year]  Today's TOC FU Call Status: Today's TOC FU Call Status:: Unsuccessful Call (1st Attempt) Unsuccessful Call (1st Attempt) Date: 03/11/24  Attempted to reach the patient regarding the most recent Inpatient/ED visit. Caller states, I can't talk right now. States on her way to doctor and call back later or tomorrow.  Follow Up Plan: Additional outreach attempts will be made to reach the patient to complete the Transitions of Care (Post Inpatient/ED visit) call.   Richerd Fish, RN, BSN, CCM [facility], [facility] Management Coordinator Direct Dial : [phone]        "

## 2024-03-12 ENCOUNTER — Telehealth: Payer: Self-pay

## 2024-03-12 NOTE — Patient Instructions (Addendum)
 Visit Information  Thank you for taking time to visit with me today. Please don't hesitate to contact me if I can be of assistance to you before our next scheduled telephone appointment.  Our next appointment is by telephone on 03/21/24 at 1300  Following is a copy of your care plan:   Goals Addressed             This Visit's Progress    VBCI Transitions of Care (TOC) Care Plan       Problems:  Recent Hospitalization for treatment of COPD Knowledge Deficit Related to medications review for accuracy  Goal:  Over the next 30 days, the patient will not experience hospital readmission  Interventions:  Transitions of Care: Doctor Visits  - discussed the importance of doctor visits  COPD Interventions: Advised patient to engage in light exercise as tolerated 3-5 days a week to aid in the the management of COPD Advised patient to track and manage COPD triggers Advised patient to self assesses COPD action plan zone and make appointment with provider if in the yellow zone for 48 hours without improvement Assessed social determinant of health barriers Discussed the importance of adequate rest and management of fatigue with COPD Provided education about and advised patient to utilize infection prevention strategies to reduce risk of respiratory infection Screening for signs and symptoms of depression related to chronic disease state   Patient Self Care Activities:  Attend all scheduled provider appointments Call pharmacy for medication refills 3-7 days in advance of running out of medications Call provider office for new concerns or questions  Notify RN Care Manager of TOC call rescheduling needs Participate in Transition of Care Program/Attend TOC scheduled calls Perform all self care activities independently  Take medications as prescribed    Plan:  The patient has been provided with contact information for the care management team and has been advised to call with any health  related questions or concerns.  03/12/24 Discussed and offered 30 day TOC program.  Patient agrees to weekly telephone calls for [facility] program.  The patient has been provided with contact information for the care management team and has been advised to call with any health -related questions or concerns.  The patient verbalized understanding with current plan of care.  The patient is directed to their insurance card regarding availability of benefits coverage.  Follow up next week         Patient verbalizes understanding of instructions and care plan provided today and agrees to view in MyChart. Active MyChart status and patient understanding of how to access instructions and care plan via MyChart confirmed with patient.     The patient has been provided with contact information for the care management team and has been advised to call with any health related questions or concerns.   Please call the care guide team at [phone] if you need to cancel or reschedule your appointment.   Please call the Suicide and Crisis Lifeline: 988 call the [place]  National Suicide Prevention Lifeline: 6195945746 or TTY: 863-178-2852 TTY 850-629-6415) to talk to a trained counselor call 1-800-273-TALK (toll free, 24 hour hotline) call the [facility]: (817)240-8240 call 911 if you are experiencing a Mental Health or Behavioral Health Crisis or need someone to talk to.  Richerd Fish, RN, BSN, CCM [facility], [facility] Management Coordinator Direct Dial : [phone]

## 2024-03-12 NOTE — Transitions of Care (Post Inpatient/ED Visit) (Signed)
 "  03/12/2024  Name: [name] MRN: [id number] DOB: 17-Nov-1965  Today's TOC FU Call Status: Today's TOC FU Call Status:: Successful TOC FU Call Completed TOC FU Call Complete Date: 03/12/24  Patient's Name and Date of Birth confirmed. Name, DOB  Transition Care Management Follow-up Telephone Call Date of Discharge: 03/08/24 [facility] Type of Discharge: Inpatient Admission Primary Inpatient Discharge Diagnosis:: Acute hypoxic respiratory failure How have you been since you were released from the hospital?: Better Any questions or concerns?: Yes Patient Questions/Concerns:: I need bigger print than what all these doctors office and hospital give, it's hard to read with the current size of the print Patient Questions/Concerns Addressed:  (Encouraged patient to speak up to those printing her instructions and ask for a larger print)  Items Reviewed: Did you receive and understand the discharge instructions provided?: Yes (Had trouble with the small print) Medications obtained,verified, and reconciled?: Yes (Medications Reviewed) Any new allergies since your discharge?: No Dietary orders reviewed?: Yes Type of Diet Ordered:: very low dodium, carb modified Do you have support at home?: Yes People in Home [RPT]: parent(s) Name of Support/Comfort Primary Source: mom, Lovenia  Medications Reviewed Today:  Patient states she has completed azithromycin  (ZITHROMAX ) cefUROXime  (CEFTIN ) Patient has Hydralazine  changes  Medications Reviewed Today     Reviewed by Eilleen Richerd GRADE, RN (Registered Nurse) on 03/12/24 at 1515  Med List Status: <None>   Medication Order Taking? Sig Documenting Provider Last Dose Status Informant  ACCU-CHEK GUIDE TEST test strip [id number]  2 (two) times daily. [provider]  Active Self, Pharmacy Records  acetaminophen  (TYLENOL ) 500 MG tablet [id number] Yes Take 1 tablet (500 mg total) by mouth every 6 (six) hours as  needed. Montorfano, Lisandro M, MD  Active Self, Pharmacy Records  albuterol  (PROVENTIL ) (2.5 MG/3ML) 0.083% nebulizer solution [id number] Yes Take 3 mLs (2.5 mg total) by nebulization every 6 (six) hours as needed for wheezing or shortness of breath. Pearlean Manus, MD  Active   albuterol  (VENTOLIN  HFA) 108 (90 Base) MCG/ACT inhaler [id number] Yes Inhale 2 puffs into the lungs every 6 (six) hours as needed for wheezing. Pearlean Manus, MD  Active   amLODipine  (NORVASC ) 5 MG tablet [id number] Yes Take 1 tablet (5 mg total) by mouth daily. Pearlean Manus, MD  Active   aspirin  81 MG chewable tablet [id number] Yes Chew 1 tablet (81 mg total) by mouth daily with breakfast. Pearlean Manus, MD  Active   atorvastatin  (LIPITOR) 40 MG tablet [id number] Yes Take 40 mg by mouth daily. [provider]  Active Self, Pharmacy Records  butalbital -acetaminophen -caffeine  (FIORICET) 50-325-40 MG tablet [id number]  Take 1 tablet by mouth every 6 (six) hours as needed for headache.  Patient not taking: Reported on 03/12/2024   Elnor Jayson LABOR, DO  Active Self, Pharmacy Records  calcitRIOL (ROCALTROL) 0.25 MCG capsule [id number] Yes Take 0.25 mcg by mouth daily. [provider]  Active Self, Pharmacy Records  Calcium  Carbonate-Vitamin D  (CALCIUM  600+D PO) [id number] Yes Take 1 tablet by mouth daily. [provider]  Active Self, Pharmacy Records  carboxymethylcellulose (REFRESH PLUS) 0.5 % SOLN [id number] Yes Place 1 drop into both eyes 3 (three) times daily as needed (dry eyes). [provider]  Active Self, Pharmacy Records  carvedilol  (COREG ) 6.25 MG tablet [id number] Yes Take 1 tablet (6.25 mg total) by mouth 2 (two) times daily with a meal. Emokpae, Courage, MD  Active   Cholecalciferol (VITAMIN D ) 2000 units tablet [id number] Yes Take 2,000  Units by mouth daily.  [provider]  Active Self, Pharmacy Records  FARXIGA 5 MG TABS tablet [id number] Yes Take 5 mg by mouth daily.  [provider]  Active Self, Pharmacy Records  gabapentin  (NEURONTIN ) 300 MG capsule [id number]  Take 1 capsule (300 mg total) by mouth 4 (four) times daily.  Patient taking differently: Take 300 mg by mouth 2 (two) times daily.   Ladona Heinz, MD  Active Self, Pharmacy Records  guaiFENesin  (MUCINEX ) 600 MG 12 hr tablet [id number] Yes Take 1 tablet (600 mg total) by mouth 2 (two) times daily. Pearlean Manus, MD  Active   HUMALOG  100 UNIT/ML injection [id number] Yes Uses in insulin  pump [provider]  Active Self, Pharmacy Records           Med Note JACKOLYN, [place] R   Tue Nov 14, 2023  8:44 AM)    hydrALAZINE  (APRESOLINE ) 25 MG tablet [id number]  Take 1 tablet (25 mg total) by mouth 3 (three) times daily. Pearlean Manus, MD  Active            Med Note LESLY, RICHERD CINDERELLA Debar Mar 12, 2024  3:12 PM) Had some confusion about this medications  Insulin  Human (INSULIN  PUMP) SOLN [id number] Yes Inject into the skin. [provider]  Active Self, Pharmacy Records  levothyroxine  (SYNTHROID , LEVOTHROID) 75 MCG tablet [id number] Yes Take 75 mcg by mouth daily before breakfast. [provider]  Active Self, Pharmacy Records  lidocaine  (LIDODERM ) 5 % [id number] Yes Place 1 patch onto the skin daily as needed. Remove & Discard patch within 12 hours or as directed by MD Elnor Jayson LABOR, DO  Active Self, Pharmacy Records  Lifitegrast  (XIIDRA ) 5 % SOLN [id number]  Place 1 drop into both eyes in the morning and at bedtime. [provider]  Active Self, Pharmacy Records  montelukast  (SINGULAIR ) 10 MG tablet [id number] Yes Take 10 mg by mouth at bedtime. [provider]  Active Self, Pharmacy Records  Multiple Vitamins-Minerals (ADULT GUMMY PO) [id number] Yes Take 1 capsule by mouth daily. [provider]  Active Self, Pharmacy Records  nystatin  (MYCOSTATIN /NYSTOP ) powder [id number] Yes Apply 1 Application topically 3 (three) times daily. Jayne Vonn DEL, MD   Active Self, Pharmacy Records  ondansetron  (ZOFRAN -ODT) 4 MG disintegrating tablet [id number]  Take 1 tablet (4 mg total) by mouth every 8 (eight) hours as needed for nausea or vomiting.  Patient not taking: Reported on 03/04/2024   Montorfano, Lisandro M, MD  Active Self, Pharmacy Records  oxyCODONE  (ROXICODONE ) 5 MG immediate release tablet [id number]  Take 1 tablet (5 mg total) by mouth every 4 (four) hours as needed for severe pain (pain score 7-10). Montorfano, Lisandro M, MD  Active Self, Pharmacy Records           Med Note [place], RICHERD CINDERELLA Debar Mar 12, 2024  3:11 PM) May need refill  predniSONE  (DELTASONE ) 20 MG tablet [id number] Yes Take 2 tablets (40 mg total) by mouth daily with breakfast for 5 days. Pearlean Manus, MD  Active   Semaglutide , 2 MG/DOSE, (OZEMPIC , 2 MG/DOSE,) 8 MG/3ML SOPN [id number] Yes INJECT 2MG  INTO THE SKIN ONCE A WEEK Ladona Heinz, MD  Active Self, Pharmacy Records           Med Note [place], RICHERD CINDERELLA Debar Mar 12, 2024  3:05 PM) Wednesdays  tamsulosin  (FLOMAX ) 0.4 MG CAPS capsule [id number] Yes Take 1 capsule (0.4 mg total) by mouth daily after supper. Emokpae,  Courage, MD  Active   torsemide  (DEMADEX ) 20 MG tablet [id number] Yes Take 1 tablet (20 mg total) by mouth daily. Pearlean Manus, MD  Active   Med List Note Elsworth Sharlet JAYSON Bishop 12/29/10 9053): Patient gets some medications filled through Mail Order.             Home Care and Equipment/Supplies: Were Home Health Services Ordered?: NA Any new equipment or medical supplies ordered?: NA  Functional Questionnaire: Do you need assistance with bathing/showering or dressing?: No Do you need assistance with meal preparation?: No Do you need assistance with eating?: No Do you have difficulty maintaining continence: No Do you need assistance with getting out of bed/getting out of a chair/moving?: No Do you have difficulty managing or taking your medications?: No  Follow up appointments reviewed: PCP  Follow-up appointment confirmed?: Yes Date of PCP follow-up appointment?: 03/11/24 Follow-up Provider: Dr. Elna Potters Specialist [facility] Follow-up appointment confirmed?: Yes Date of Specialist follow-up appointment?: 03/20/24 Follow-Up Specialty Provider:: Plastic Surgeon Do you need transportation to your follow-up appointment?: No Do you understand care options if your condition(s) worsen?: Yes-patient verbalized understanding  SDOH Interventions Today    Flowsheet Row Most Recent Value  SDOH Interventions   Food Insecurity Interventions Intervention Not Indicated  Housing Interventions Intervention Not Indicated  Transportation Interventions Other (Comment)  [RCATS]  Utilities Interventions Intervention Not Indicated    Goals Addressed             This Visit's Progress    VBCI Transitions of Care (TOC) Care Plan       Problems:  Recent Hospitalization for treatment of COPD Knowledge Deficit Related to medications review for accuracy  Goal:  Over the next 30 days, the patient will not experience hospital readmission  Interventions:  Transitions of Care: Doctor Visits  - discussed the importance of doctor visits  COPD Interventions: Advised patient to engage in light exercise as tolerated 3-5 days a week to aid in the the management of COPD Advised patient to track and manage COPD triggers Advised patient to self assesses COPD action plan zone and make appointment with provider if in the yellow zone for 48 hours without improvement Assessed social determinant of health barriers Discussed the importance of adequate rest and management of fatigue with COPD Provided education about and advised patient to utilize infection prevention strategies to reduce risk of respiratory infection Screening for signs and symptoms of depression related to chronic disease state   Patient Self Care Activities:  Attend all scheduled provider appointments Call pharmacy for medication  refills 3-7 days in advance of running out of medications Call provider office for new concerns or questions  Notify RN Care Manager of TOC call rescheduling needs Participate in Transition of Care Program/Attend TOC scheduled calls Perform all self care activities independently  Take medications as prescribed    Plan:  The patient has been provided with contact information for the care management team and has been advised to call with any health related questions or concerns.  03/12/24 Discussed and offered 30 day TOC program.  Patient agrees to weekly telephone calls for [facility] program.  The patient has been provided with contact information for the care management team and has been advised to call with any health -related questions or concerns.  The patient verbalized understanding with current plan of care.  The patient is directed to their insurance card regarding availability of benefits coverage.  Follow up next week  Richerd Fish, RN, BSN, CCM [facility], [facility] Management Coordinator Direct Dial : [phone]        "

## 2024-03-20 ENCOUNTER — Ambulatory Visit

## 2024-03-20 ENCOUNTER — Telehealth: Payer: Self-pay

## 2024-03-20 VITALS — BP 108/56 | HR 61

## 2024-03-20 DIAGNOSIS — Z09 Encounter for follow-up examination after completed treatment for conditions other than malignant neoplasm: Secondary | ICD-10-CM

## 2024-03-20 DIAGNOSIS — Z9889 Other specified postprocedural states: Secondary | ICD-10-CM

## 2024-03-20 NOTE — Telephone Encounter (Signed)
 Faxed Prism  order for 4x4 mepilex border.  Received fax confirmation. This message was sent via FAXCOM, a product from [employer]. [email or web address].biscom.com/                    -------Fax Transmission Report-------  To:               Recipient at [phone] Subject:          SECURED Result:           The transmission was successful. Explanation:      All Pages Ok Pages Sent:       11 Connect Time:     4 minutes, 21 seconds Transmit Time:    03/20/2024 15:08 Transfer Rate:    14400 Status Code:      0000 Retry Count:      0 Job Id:           [id number] Unique Id:        [id number] Fax Line:         17 Fax Server:       [employer]

## 2024-03-20 NOTE — Progress Notes (Signed)
" ° °  Established Patient Office Visit  Subjective   Patient ID: [name], female    DOB: October 05, 1965  Age: 59 y.o. MRN: [id number]  59 year old female s/p panniculectomy performed 01/29/2024 by Dr. Bradrick Kamau who presents to clinic for postoperative follow-up.  Patient was admitted to the ICU earlier this month with acute approximate respiratory distress due to pneumonia.  Patient states she is doing much better and her respiratory symptoms have resolved.  Reports no issues from a panniculectomy standpoint.    Objective:     BP (!) 108/56 (BP Location: Right Arm, Patient Position: Sitting, Cuff Size: Large)   Pulse 61   LMP 07/29/2013   SpO2 100%  BP Readings from Last 3 Encounters:  03/20/24 (!) 108/56  03/08/24 133/74  02/21/24 127/60    Physical Exam MA as chaperone On exam, abdomen is soft and nondistended. No focal areas of tenderness. No asymmetric swelling. Incision CDI throughout with the exception of a 1 cm incisional wound centrally with some incisional slough noted. No surrounding erythema or induration. No evidence concerning for infection. Remainder of exam is entirely benign. The central area was treated with silver nitrate.  No results found for any visits on 03/20/24.  Last CBC Lab Results  Component Value Date   WBC 7.3 03/06/2024   HGB 10.9 (L) 03/06/2024   HCT 34.5 (L) 03/06/2024   MCV 83.5 03/06/2024   MCH 26.4 03/06/2024   RDW 14.2 03/06/2024   PLT 173 03/06/2024   Last metabolic panel Lab Results  Component Value Date   GLUCOSE 219 (H) 03/08/2024   NA 139 03/08/2024   K 4.1 03/08/2024   CL 102 03/08/2024   CO2 31 03/08/2024   BUN 32 (H) 03/08/2024   CREATININE 1.36 (H) 03/08/2024   GFRNONAA 45 (L) 03/08/2024   CALCIUM  8.7 (L) 03/08/2024   PHOS 2.8 03/08/2024   PROT 7.3 03/03/2024   ALBUMIN 3.3 (L) 03/08/2024   BILITOT 0.3 03/03/2024   ALKPHOS 120 03/03/2024   AST 62 (H) 03/03/2024   ALT 30 03/03/2024   ANIONGAP 6 03/08/2024     The 10-year ASCVD risk score (Arnett DK, et al., 2019) is: 5.6%    Assessment & Plan:   Problem List Items Addressed This Visit   None Visit Diagnoses       Status post panniculectomy    -  Primary     Postoperative examination          Patient instructed to keep the wound covered with sterile dressing, can shower let the water  run and the soap run on top of the incision, then pat dry it and cover with sterile dressing again.  Continue abdominal binder for compression.  Can return to normal activities starting tomorrow.  All questions answered to patient's satisfaction.  Patient will follow-up with me or PA in 3 to 4 weeks for wound checks.  Shina Wass M Zahid Carneiro, MD   "

## 2024-03-21 ENCOUNTER — Telehealth: Payer: Self-pay

## 2024-03-22 ENCOUNTER — Telehealth: Payer: Self-pay

## 2024-03-22 NOTE — Transitions of Care (Post Inpatient/ED Visit) (Signed)
 Care Management  Transitions of Care Program Transitions of Care Post-discharge week 2  03/22/2024 Name: [name] MRN: [id number] DOB: 1965/04/07  Subjective: [name] is a 59 y.o. year old female who is a primary care patient of Leigh Lung, MD. The Care Management team was unable to reach the patient by phone to assess and address transitions of care needs.   Plan: Additional outreach attempts will be made to reach the patient enrolled in the [facility] Program (Post Inpatient/ED Visit).  Richerd Fish, RN, BSN, CCM [facility], [facility] RN Care Manager Direct Dial : [phone]

## 2024-03-23 ENCOUNTER — Other Ambulatory Visit: Payer: Self-pay | Admitting: Cardiology

## 2024-03-23 DIAGNOSIS — E1122 Type 2 diabetes mellitus with diabetic chronic kidney disease: Secondary | ICD-10-CM

## 2024-03-25 ENCOUNTER — Telehealth: Payer: Self-pay

## 2024-03-25 NOTE — Transitions of Care (Post Inpatient/ED Visit) (Signed)
 Care Management  Transitions of Care Program Transitions of Care Post-discharge week 3  03/25/2024 Name: [name] MRN: [id number] DOB: Jun 15, 1965  Subjective: [name] is a 59 y.o. year old female who is a primary care patient of Leigh Lung, MD. The Care Management team spoke with patient by telephone to assess and address transitions of care needs. Patient politely declined ongoing TOC calls, states no needs.  Plan: No further outreach attempts will be made at this time.  We have been unable to reach the patient.  Richerd Fish, RN, BSN, CCM [facility], [facility] RN Care Manager Direct Dial : [phone]

## 2024-04-24 ENCOUNTER — Encounter

## 2024-05-17 ENCOUNTER — Encounter: Admitting: Dietician
